# Patient Record
Sex: Female | Born: 1980 | Race: White | Hispanic: No | Marital: Married | Smoking: Former smoker
Health system: Southern US, Community
[De-identification: ages and names within clinical notes are randomized; demographics above are authoritative.]

## PROBLEM LIST (undated history)

## (undated) DIAGNOSIS — F411 Generalized anxiety disorder: Secondary | ICD-10-CM

## (undated) DIAGNOSIS — I10 Essential (primary) hypertension: Secondary | ICD-10-CM

## (undated) DIAGNOSIS — J45909 Unspecified asthma, uncomplicated: Secondary | ICD-10-CM

## (undated) DIAGNOSIS — E890 Postprocedural hypothyroidism: Secondary | ICD-10-CM

## (undated) DIAGNOSIS — M542 Cervicalgia: Secondary | ICD-10-CM

## (undated) DIAGNOSIS — T4145XA Adverse effect of unspecified anesthetic, initial encounter: Secondary | ICD-10-CM

## (undated) DIAGNOSIS — F40240 Claustrophobia: Secondary | ICD-10-CM

## (undated) DIAGNOSIS — R87619 Unspecified abnormal cytological findings in specimens from cervix uteri: Secondary | ICD-10-CM

## (undated) DIAGNOSIS — I639 Cerebral infarction, unspecified: Secondary | ICD-10-CM

## (undated) DIAGNOSIS — G8929 Other chronic pain: Secondary | ICD-10-CM

## (undated) DIAGNOSIS — G43909 Migraine, unspecified, not intractable, without status migrainosus: Secondary | ICD-10-CM

## (undated) DIAGNOSIS — G4700 Insomnia, unspecified: Secondary | ICD-10-CM

## (undated) DIAGNOSIS — G4730 Sleep apnea, unspecified: Secondary | ICD-10-CM

## (undated) DIAGNOSIS — Z87442 Personal history of urinary calculi: Secondary | ICD-10-CM

## (undated) DIAGNOSIS — T8859XA Other complications of anesthesia, initial encounter: Secondary | ICD-10-CM

## (undated) DIAGNOSIS — R002 Palpitations: Secondary | ICD-10-CM

## (undated) DIAGNOSIS — K219 Gastro-esophageal reflux disease without esophagitis: Secondary | ICD-10-CM

## (undated) DIAGNOSIS — M549 Dorsalgia, unspecified: Secondary | ICD-10-CM

## (undated) DIAGNOSIS — M5430 Sciatica, unspecified side: Secondary | ICD-10-CM

## (undated) HISTORY — DX: Generalized anxiety disorder: F41.1

## (undated) HISTORY — PX: CARPAL TUNNEL RELEASE: SHX101

## (undated) HISTORY — PX: TOTAL ABDOMINAL HYSTERECTOMY W/ BILATERAL SALPINGOOPHORECTOMY: SHX83

## (undated) HISTORY — DX: Insomnia, unspecified: G47.00

## (undated) HISTORY — PX: ABDOMINAL HYSTERECTOMY: SHX81

## (undated) HISTORY — DX: Postprocedural hypothyroidism: E89.0

## (undated) HISTORY — PX: CHOLECYSTECTOMY: SHX55

## (undated) HISTORY — DX: Migraine, unspecified, not intractable, without status migrainosus: G43.909

## (undated) HISTORY — PX: LUMBAR EPIDURAL INJECTION: SHX1980

---
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
## 2001-03-20 HISTORY — PX: TUBAL LIGATION: SHX77

## 2002-09-19 ENCOUNTER — Emergency Department (HOSPITAL_COMMUNITY): Admission: EM | Admit: 2002-09-19 | Discharge: 2002-09-19 | Payer: Self-pay | Admitting: Emergency Medicine

## 2002-09-24 ENCOUNTER — Encounter: Payer: Self-pay | Admitting: Emergency Medicine

## 2002-09-24 ENCOUNTER — Ambulatory Visit (HOSPITAL_COMMUNITY): Admission: RE | Admit: 2002-09-24 | Discharge: 2002-09-24 | Payer: Self-pay | Admitting: Emergency Medicine

## 2003-03-01 ENCOUNTER — Emergency Department (HOSPITAL_COMMUNITY): Admission: EM | Admit: 2003-03-01 | Discharge: 2003-03-01 | Payer: Self-pay | Admitting: Emergency Medicine

## 2005-03-05 ENCOUNTER — Emergency Department (HOSPITAL_COMMUNITY): Admission: EM | Admit: 2005-03-05 | Discharge: 2005-03-05 | Payer: Self-pay | Admitting: Emergency Medicine

## 2005-09-08 ENCOUNTER — Emergency Department (HOSPITAL_COMMUNITY): Admission: EM | Admit: 2005-09-08 | Discharge: 2005-09-08 | Payer: Self-pay | Admitting: Emergency Medicine

## 2005-11-01 ENCOUNTER — Emergency Department (HOSPITAL_COMMUNITY): Admission: EM | Admit: 2005-11-01 | Discharge: 2005-11-01 | Payer: Self-pay | Admitting: Emergency Medicine

## 2006-01-10 ENCOUNTER — Emergency Department (HOSPITAL_COMMUNITY): Admission: EM | Admit: 2006-01-10 | Discharge: 2006-01-10 | Payer: Self-pay | Admitting: Emergency Medicine

## 2007-04-10 ENCOUNTER — Ambulatory Visit: Payer: Self-pay | Admitting: Cardiovascular Disease

## 2007-04-15 ENCOUNTER — Ambulatory Visit: Payer: Self-pay | Admitting: Cardiovascular Disease

## 2007-04-15 ENCOUNTER — Ambulatory Visit (HOSPITAL_COMMUNITY): Admission: RE | Admit: 2007-04-15 | Discharge: 2007-04-16 | Payer: Self-pay | Admitting: Cardiovascular Disease

## 2007-04-17 ENCOUNTER — Ambulatory Visit (HOSPITAL_COMMUNITY): Admission: RE | Admit: 2007-04-17 | Discharge: 2007-04-17 | Payer: Self-pay | Admitting: Cardiovascular Disease

## 2007-04-23 ENCOUNTER — Ambulatory Visit: Payer: Self-pay | Admitting: Cardiology

## 2007-05-16 ENCOUNTER — Emergency Department (HOSPITAL_COMMUNITY): Admission: EM | Admit: 2007-05-16 | Discharge: 2007-05-16 | Payer: Self-pay | Admitting: Emergency Medicine

## 2007-08-22 ENCOUNTER — Encounter: Payer: Self-pay | Admitting: Endocrinology

## 2007-08-30 ENCOUNTER — Ambulatory Visit: Payer: Self-pay | Admitting: Endocrinology

## 2007-08-30 DIAGNOSIS — F411 Generalized anxiety disorder: Secondary | ICD-10-CM

## 2007-08-30 DIAGNOSIS — G43909 Migraine, unspecified, not intractable, without status migrainosus: Secondary | ICD-10-CM

## 2007-08-30 DIAGNOSIS — G4700 Insomnia, unspecified: Secondary | ICD-10-CM

## 2007-08-30 HISTORY — DX: Migraine, unspecified, not intractable, without status migrainosus: G43.909

## 2007-08-30 HISTORY — DX: Insomnia, unspecified: G47.00

## 2007-08-30 HISTORY — DX: Generalized anxiety disorder: F41.1

## 2007-09-02 ENCOUNTER — Telehealth (INDEPENDENT_AMBULATORY_CARE_PROVIDER_SITE_OTHER): Payer: Self-pay | Admitting: *Deleted

## 2007-09-03 ENCOUNTER — Encounter: Admission: RE | Admit: 2007-09-03 | Discharge: 2007-09-03 | Payer: Self-pay | Admitting: Endocrinology

## 2007-09-05 ENCOUNTER — Encounter: Admission: RE | Admit: 2007-09-05 | Discharge: 2007-09-05 | Payer: Self-pay | Admitting: Obstetrics and Gynecology

## 2007-10-16 ENCOUNTER — Telehealth (INDEPENDENT_AMBULATORY_CARE_PROVIDER_SITE_OTHER): Payer: Self-pay | Admitting: *Deleted

## 2007-10-20 ENCOUNTER — Emergency Department (HOSPITAL_COMMUNITY): Admission: EM | Admit: 2007-10-20 | Discharge: 2007-10-20 | Payer: Self-pay | Admitting: Emergency Medicine

## 2007-10-28 ENCOUNTER — Ambulatory Visit (HOSPITAL_COMMUNITY): Admission: RE | Admit: 2007-10-28 | Discharge: 2007-10-28 | Payer: Self-pay | Admitting: Family Medicine

## 2007-11-11 ENCOUNTER — Ambulatory Visit: Payer: Self-pay | Admitting: Endocrinology

## 2007-11-15 ENCOUNTER — Telehealth (INDEPENDENT_AMBULATORY_CARE_PROVIDER_SITE_OTHER): Payer: Self-pay | Admitting: *Deleted

## 2008-01-02 ENCOUNTER — Ambulatory Visit: Payer: Self-pay | Admitting: Endocrinology

## 2008-01-15 ENCOUNTER — Telehealth: Payer: Self-pay | Admitting: Endocrinology

## 2008-03-20 DIAGNOSIS — I639 Cerebral infarction, unspecified: Secondary | ICD-10-CM

## 2008-03-20 HISTORY — DX: Cerebral infarction, unspecified: I63.9

## 2008-07-24 ENCOUNTER — Telehealth (INDEPENDENT_AMBULATORY_CARE_PROVIDER_SITE_OTHER): Payer: Self-pay | Admitting: *Deleted

## 2008-09-11 ENCOUNTER — Ambulatory Visit: Payer: Self-pay | Admitting: Endocrinology

## 2008-12-07 ENCOUNTER — Ambulatory Visit: Payer: Self-pay | Admitting: Endocrinology

## 2008-12-07 DIAGNOSIS — E0500 Thyrotoxicosis with diffuse goiter without thyrotoxic crisis or storm: Secondary | ICD-10-CM | POA: Insufficient documentation

## 2008-12-08 LAB — CONVERTED CEMR LAB
Free T4: 0.9 ng/dL (ref 0.6–1.6)
TSH: 0.06 microintl units/mL — ABNORMAL LOW (ref 0.35–5.50)

## 2008-12-22 ENCOUNTER — Encounter (HOSPITAL_COMMUNITY): Admission: RE | Admit: 2008-12-22 | Discharge: 2009-02-19 | Payer: Self-pay | Admitting: Endocrinology

## 2008-12-28 ENCOUNTER — Telehealth: Payer: Self-pay | Admitting: Endocrinology

## 2009-01-06 ENCOUNTER — Ambulatory Visit (HOSPITAL_COMMUNITY): Admission: RE | Admit: 2009-01-06 | Discharge: 2009-01-06 | Payer: Self-pay | Admitting: Endocrinology

## 2009-02-08 ENCOUNTER — Ambulatory Visit: Payer: Self-pay | Admitting: Endocrinology

## 2009-02-08 DIAGNOSIS — E890 Postprocedural hypothyroidism: Secondary | ICD-10-CM

## 2009-02-08 HISTORY — DX: Postprocedural hypothyroidism: E89.0

## 2009-02-08 LAB — CONVERTED CEMR LAB: Free T4: 0.3 ng/dL — ABNORMAL LOW (ref 0.6–1.6)

## 2009-05-10 ENCOUNTER — Telehealth: Payer: Self-pay | Admitting: Endocrinology

## 2009-08-23 ENCOUNTER — Telehealth: Payer: Self-pay | Admitting: Endocrinology

## 2009-08-23 ENCOUNTER — Encounter (INDEPENDENT_AMBULATORY_CARE_PROVIDER_SITE_OTHER): Payer: Self-pay | Admitting: *Deleted

## 2009-08-23 ENCOUNTER — Ambulatory Visit: Payer: Self-pay | Admitting: Endocrinology

## 2009-08-23 LAB — CONVERTED CEMR LAB: TSH: 132.17 microintl units/mL — ABNORMAL HIGH (ref 0.35–5.50)

## 2009-08-24 ENCOUNTER — Telehealth: Payer: Self-pay | Admitting: Endocrinology

## 2009-10-26 ENCOUNTER — Telehealth: Payer: Self-pay | Admitting: Endocrinology

## 2009-10-26 ENCOUNTER — Encounter: Payer: Self-pay | Admitting: Endocrinology

## 2009-11-08 ENCOUNTER — Telehealth: Payer: Self-pay | Admitting: Endocrinology

## 2009-11-23 ENCOUNTER — Telehealth: Payer: Self-pay | Admitting: Endocrinology

## 2010-02-03 ENCOUNTER — Ambulatory Visit: Payer: Self-pay | Admitting: Endocrinology

## 2010-02-03 LAB — CONVERTED CEMR LAB: TSH: 8.35 microintl units/mL — ABNORMAL HIGH (ref 0.35–5.50)

## 2010-04-10 ENCOUNTER — Encounter: Payer: Self-pay | Admitting: Family Medicine

## 2010-04-19 NOTE — Progress Notes (Signed)
Summary: Rx refill req  Phone Note Refill Request Message from:  Patient on November 23, 2009 9:42 AM  Refills Requested: Medication #1:  LEVOTHYROXINE SODIUM 150 MCG TABS 1 once daily   Dosage confirmed as above?Dosage Confirmed   Supply Requested: 3 months  Method Requested: Electronic Initial call taken by: Margaret Pyle, CMA,  November 23, 2009 9:42 AM    Prescriptions: LEVOTHYROXINE SODIUM 150 MCG TABS (LEVOTHYROXINE SODIUM) 1 once daily  #30 x 2   Entered by:   Margaret Pyle, CMA   Authorized by:   Minus Breeding MD   Signed by:   Margaret Pyle, CMA on 11/23/2009   Method used:   Electronically to        [facility] * (retail)       [address]       [place], [place]  [address]       Ph: [phone]       Fax: [phone]   RxID:   [id number]

## 2010-04-19 NOTE — Progress Notes (Signed)
Summary: TSH  Phone Note Call from Patient Call back at Work Phone [phone]   Caller: Patient Call For: Dr Everardo All Summary of Call: Pt had TSH lab 2 wks and is calling for results. Please advise. Initial call taken by: Verdell Face,  November 08, 2009 10:47 AM  Follow-up for Phone Call        Pt advised of Lab results via VM, told to call back with any further questions or concerns Follow-up by: Margaret Pyle, CMA,  November 08, 2009 10:56 AM

## 2010-04-19 NOTE — Letter (Signed)
Summary: Out of Work  [facility]  [address] [place], [place] [address]   Phone: [phone]  Fax: [phone]    August 23, 2009   Employee:  EVALIE HARGRAVES Hofland    To Whom It May Concern:   For Medical reasons, please excuse the above named employee from work for the following dates:  Start: 08/23/09    End: 08/23/09    If you need additional information, please feel free to contact our office.         Sincerely,    Dr. Romero Belling

## 2010-04-19 NOTE — Progress Notes (Signed)
Summary: TSH Lab order  Phone Note Call from Patient Call back at Work Phone [phone]   Caller: Patient Summary of Call: Pt called requesting lab order for TSH check. Pt states that [facility] will accept orders. Fax [phone]. Lab order must include Dx code and return fax number. Initial call taken by: Margaret Pyle, CMA,  October 26, 2009 10:05 AM  Follow-up for Phone Call        i printed Follow-up by: Minus Breeding MD,  October 26, 2009 10:24 AM  Additional Follow-up for Phone Call Additional follow up Details #1::        Rx faxed, pt informed Additional Follow-up by: Margaret Pyle, CMA,  October 26, 2009 10:57 AM    New/Updated Medications: * TSH 244.1  Prescriptions: TSH 244.1   #0 x 0   Entered and Authorized by:   Minus Breeding MD   Signed by:   Minus Breeding MD on 10/26/2009   Method used:   Print then Give to Patient   RxID:   [id number]

## 2010-04-19 NOTE — Progress Notes (Signed)
Summary: Rx wrong pharmacy  Phone Note Call from Patient Call back at Work Phone [phone]   Caller: Patient Summary of Call: Pt called requesting Rx for Synthroid be sent to [facility] in [place] Initial call taken by: Margaret Pyle, CMA,  August 24, 2009 3:22 PM    Prescriptions: LEVOTHYROXINE SODIUM 150 MCG TABS (LEVOTHYROXINE SODIUM) 1 once daily  #30 x 2   Entered by:   Margaret Pyle, CMA   Authorized by:   Minus Breeding MD   Signed by:   Margaret Pyle, CMA on 08/24/2009   Method used:   Electronically to        [facility]. [address] * (retail)       [address]       [place], [place]  [address]       Ph: [phone]       Fax: [phone]   RxID:   [id number]

## 2010-04-19 NOTE — Assessment & Plan Note (Signed)
Summary: right arm pain,itching,burning,eyes protruding and dry/lb   Vital Signs:  Patient profile:   30 year old female Height:      68 inches (172.72 cm) Weight:      192 pounds (87.27 kg) BMI:     29.30 O2 Sat:      97 % on Room air Temp:     98.3 degrees F (36.83 degrees C) oral Pulse rate:   76 / minute BP sitting:   130 / 84  (left arm) Cuff size:   regular  Vitals Entered By: Brenton Grills CMA Duncan Dull) (February 03, 2010 2:45 PM)  O2 Flow:  Room air CC: Muscle aches, fatigues, itching, burning eyes/aj Is Patient Diabetic? No   Referring Provider:  Lubertha South  CC:  Muscle aches, fatigues, itching, and burning eyes/aj.  History of Present Illness: pt states few weeks of slight swelling at the right hand, and associated pain at the right arm and forearm.   her proptosis is worse recently, especially in am.    Current Medications (verified): 1)  Tylox 5-500 Mg  Caps (Oxycodone-Acetaminophen) .... Take 1-2 By Mouth Once Daily Prn 2)  Temazepam 30 Mg  Caps (Temazepam) .... Take 1 At Bedtime Prn 3)  Levothyroxine Sodium 150 Mcg Tabs (Levothyroxine Sodium) .Marland Kitchen.. 1 Once Daily 4)  Tsh 244.1  Allergies (verified): No Known Drug Allergies  Past History:  Past Medical History: Last updated: 11/11/2007 HYPERTHYROIDISM (ICD-242.90) INSOMNIA (ICD-780.52) MIGRAINE HEADACHE (ICD-346.90) ANXIETY (ICD-300.00)  Review of Systems  The patient denies fever.         denies skin rash.  Physical Exam  General:  normal appearance.  normal appearance.   Eyes:  there is moderate bilateral proptosis Pulses:  right radial is normal Extremities:  right hand:  slight swelling, but no erythema/warmth/tenderness Additional Exam:  FastTSH              [H]  8.35 uIU/mL     Impression & Recommendations:  Problem # 1:  GRAVES' DISEASE (ICD-242.00) opthalmopathy is slightly worse  Problem # 2:  hand swelling mild uncertain etiology  Medications Added to Medication List This  Visit: 1)  Prednisone 10 Mg Tabs (Prednisone) .... As dir 2)  Levothyroxine Sodium 175 Mcg Tabs (Levothyroxine sodium) .Marland Kitchen.. 1 tab once daily  Other Orders: TLB-TSH (Thyroid Stimulating Hormone) (84443-TSH) Est. Patient Level II (75643)  Patient Instructions: 1)  on a trial basis, take prednisone 10 mg.  4/day x 2 days, then 2/day x 2 days, then 1/day x 2 days. 2)  blood tests are being ordered for you today.  please call [phone] to hear your test results. 3)  (update: i left message on phone-tree:  increase synthroid to 175 micrograms/day). Prescriptions: LEVOTHYROXINE SODIUM 175 MCG TABS (LEVOTHYROXINE SODIUM) 1 tab once daily  #30 x 11   Entered and Authorized by:   Minus Breeding MD   Signed by:   Minus Breeding MD on 02/04/2010   Method used:   Electronically to        [facility]. [address] * (retail)       [address]       [place], [place]  [address]       Ph: [phone]       Fax: [phone]   RxID:   [id number] PREDNISONE 10 MG TABS (PREDNISONE) as dir  #15 x 0   Entered and Authorized by:   Minus Breeding MD   Signed by:   Gregary Signs  Ardeen Garland MD on 02/03/2010   Method used:   Electronically to        [facility]. [address] * (retail)       [address]       [place], [place]  [address]       Ph: [phone]       Fax: [phone]   RxID:   [id number]    Orders Added: 1)  TLB-TSH (Thyroid Stimulating Hormone) [84443-TSH] 2)  Est. Patient Level II [84132]

## 2010-04-19 NOTE — Progress Notes (Signed)
Summary: Dosage verification  Phone Note Call from Patient Call back at Home Phone [phone] Call back at Work Phone [phone]   Caller: Patient Summary of Call: Pt called to inform Md of her current dose of Synthroid: Initial call taken by: Margaret Pyle, CMA,  August 23, 2009 4:24 PM  Follow-up for Phone Call        see phone-tree message Follow-up by: Minus Breeding MD,  August 23, 2009 7:57 PM

## 2010-04-19 NOTE — Progress Notes (Signed)
Summary: rx refill  Phone Note Call from Patient Call back at Work Phone [phone]   Caller: Patient Summary of Call: pt called stating that she has scheduled an appt for the end of March. pt is requesting a month supply of Levothyroxine Initial call taken by: Margaret Pyle, CMA,  May 10, 2009 10:42 AM    Prescriptions: LEVOTHYROXINE SODIUM 100 MCG TABS (LEVOTHYROXINE SODIUM) 1 qd  #30 x 0   Entered by:   Margaret Pyle, CMA   Authorized by:   Minus Breeding MD   Signed by:   Margaret Pyle, CMA on 05/10/2009   Method used:   Electronically to        [facility]. [address] * (retail)       [address]       [place], [place]  [address]       Ph: [phone]       Fax: [phone]   RxID:   [id number]

## 2010-04-19 NOTE — Assessment & Plan Note (Signed)
Summary: FU---STC   Vital Signs:  Patient profile:   30 year old female Height:      68 inches (172.72 cm) Weight:      189.8 pounds (86.27 kg) O2 Sat:      97 % on Room air Temp:     98.1 degrees F (36.72 degrees C) oral Pulse rate:   68 / minute BP sitting:   112 / 78  (left arm) Cuff size:   regular  Vitals Entered By: Orlan Leavens (August 23, 2009 10:39 AM)  O2 Flow:  Room air CC: f/u on thyroid Is Patient Diabetic? No   Referring Provider:  Lubertha South  CC:  f/u on thyroid.  History of Present Illness: pt states she still has irritation and swelling of the eyes.  she had i-131 rx for hyperthyroidism, approx 7 mos ago.  she says her synthroid pill is peach-colored. she has muscle cramps and myalgias.    Current Medications (verified): 1)  Tylox 5-500 Mg  Caps (Oxycodone-Acetaminophen) .... Take 1-2 By Mouth Once Daily Prn 2)  Temazepam 30 Mg  Caps (Temazepam) .... Take 1 At Bedtime Prn 3)  Levothyroxine Sodium 100 Mcg Tabs (Levothyroxine Sodium) .Marland Kitchen.. 1 Qd  Allergies (verified): No Known Drug Allergies  Past History:  Past Medical History: Last updated: 11/11/2007 HYPERTHYROIDISM (ICD-242.90) INSOMNIA (ICD-780.52) MIGRAINE HEADACHE (ICD-346.90) ANXIETY (ICD-300.00)  Review of Systems       The patient complains of weight gain.         she reports fatigue.  Physical Exam  General:  obese.  no distress  Eyes:  there is moderate bilateral proptosis Neck:  thyroid slightly enlarged, if at all.  no nodule. Msk:  no muscle tenderness Skin:  not diaphoretic   Impression & Recommendations:  Problem # 1:  HYPOTHYROIDISM, POST-RADIATION (ICD-244.1) needs increased rx ? compliance  Medications Added to Medication List This Visit: 1)  Levothyroxine Sodium 150 Mcg Tabs (Levothyroxine sodium) .Marland Kitchen.. 1 once daily  Other Orders: TLB-TSH (Thyroid Stimulating Hormone) (84443-TSH) Est. Patient Level II ([address])  Patient Instructions: 1)  tests are being ordered  for you today.  a few days after the test(s), please call [phone] to hear your test results. 2)  i would be happy to check other blood tests for your symptoms, but it is cheaper to check the thyroid first. 3)  Please schedule a follow-up appointment in 6 months. 4)  please call us and verify the dosage of your thyroid pill. 5)  (update:  i left message on phone tree:  increase synthroid to 150/day.  go to lab in 1 month for tsh 244.1) Prescriptions: LEVOTHYROXINE SODIUM 150 MCG TABS (LEVOTHYROXINE SODIUM) 1 once daily  #30 x 2   Entered and Authorized by:   Minus Breeding MD   Signed by:   Minus Breeding MD on 08/23/2009   Method used:   Electronically to        [facility] [address] [facility] (retail)       [address]       [place], [place]  [address]       Ph: [phone]       Fax: [phone]   RxID:   [id number]

## 2010-08-02 NOTE — Assessment & Plan Note (Signed)
[facility]                       [place] CARDIOLOGY OFFICE NOTE   NAME:Mauney, SHAMAR KRACKE                  MRN:          [id number]  DATE:04/10/2007                            DOB:          05-29-1980    Ms. Peeters is a 30 year old patient of Dr. Lubertha South.  She is  referred for primarily palpitations and shortness of breath.   Cataleyah is an interesting person.  She has been having palpitations  since August, she gets some daily.  The longest they last is 8-10 beats.  She has never passed out from them, they are irritating however.  She  sometimes can cough or hold her breath and they seem to get better.   She does not relate them to anything in particular.  She is taking a new  diet pill, she says it has a diuretic in it.  I told her we needed to  see this, but it may be contributing.   In regards her palpitations they are not associated with chest pain.  She does have some chronic exertional dyspnea, particularly when she has  her palpitations.  There is no history of PE.  She is a pack and a half  a day smoker.  She smokes pot but denies any other stimulants.   Her palpitations have been somewhat progressive since August.   REVIEW OF SYSTEMS:  Otherwise remarkable for a question of a TIA  December 25.  She was in her car, she had some numbness and tingling in  her right hand and a question of a facial droop.  She says she was seen  at Dr. Fletcher Anon office.  As far as I can tell she was not placed on  Plavix or Aggrenox and did not have a big workup.  I do not know what  this involved but she says she did not have a CAT scan or MRI.   Her symptoms seemed to have resolved.   The patient's review of systems otherwise negative.   PAST MEDICAL HISTORY:  1. Insomnia on temazepam 15 nightly.  2. Depression on Celexa 25 a day.  3. She smokes a pack and a half a day.  4. History of migraines.   PAST SURGICAL HISTORY:  A partial tubal  ligation.   She is married.  Her husband is a Psychologist, occupational.  She is fairly busy.  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  She admits to smoking a  pack and a half a day as well as smoking pot, but denies other drugs.  She had her stepdaughter with her today.  She is fairly sedentary  however.   MEDICATIONS:  1. Celexa 25 a day.  2. Temazepam 15 nightly.  3. An aspirin a day.   SHE HAS NO KNOWN ALLERGIES.   She does not drink alcohol.   Father died in his 21s of an MI.  Mother is alive with high blood  pressure.  She has 4 brothers and a sister who are alive.   EXAM:  Remarkable for a young  white female in no distress.  She has  multiple tattoos on both arms and both legs.  Her blood pressure is  120/70, pulse 84 and regular, weight 198, afebrile, respiratory rate 14.  HEENT:  Unremarkable.  She does appear to have bilateral carotid bruits,  I was surprised by this, they tend to be high up near the jaw and may  represent fibromuscular dysplasia.  There is no JVP elevation,  lymphadenopathy or thyromegaly.  LUNGS:  Clear with good diaphragmatic motion, no wheezing.  S1 and S2 with normal heart sounds, PMI normal.  ABDOMEN:  Benign, no renal bruits, no AAA, no hepatosplenomegaly or  hepatojugular reflux.  Distal pulses are intact, no edema.  NEURO:  Nonfocal.  SKIN:  Warm and dry.  No muscular weakness.   EKG is normal with occasional PACs and PVCs.   IMPRESSION:  1. Palpitations.  They sound benign, they may be related to this diet      medicine that she takes since they are happening daily.  We will      get a 48-hour Holter.  I suspect she may benefit from beta-blocker.  2. Question of a transient ischemic attack in December.  This puzzles      me, I am not sure why this was not worked up further.  She appears      to have bilateral bruits.  We will start by getting a carotid      duplex.  I may go  further depending on her insurance and look for      more intracranial abnormalities with an MRA.  For the time being we will continue her on aspirin and have a low  threshold to add Aggrenox.  1. In the setting of some dyspnea, palpitations and a question of a      transient ischemic attack, the patient will have a 2D      echocardiogram to rule out congenital heart disease, source of      embolus and assess left ventricular function.  2. Smoking cessation.  I talked to her at length about this, counseled      her for less than 10 minutes.  She will try to cut back and is not      interested in Chantix.  I would not recommend this since she is      already taking Celexa and temazepam.  3. Marijuana use.  Again cautioned the patient about this in regards      to amotivational syndrome and leading to other hard core drugs.  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  I will see her back after  her Holter monitor, echo and carotid duplex.     Noralyn Pick. Eden Emms, MD, [facility]  Electronically Signed    PCN/MedQ  DD: 04/10/2007  DT: 04/10/2007  Job #: [id number]

## 2010-08-02 NOTE — Procedures (Signed)
NAME:  Teresa Vincent, Teresa Vincent           ACCOUNT NO.:  [id number]   MEDICAL RECORD NO.:  [id number]          PATIENT TYPE:  OUT   LOCATION:  RAD                           FACILITY:  [facility]   PHYSICIAN:  Peter C. Eden Emms, MD, FACCDATE OF BIRTH:  12/03/1980   DATE OF PROCEDURE:  DATE OF DISCHARGE:                                ECHOCARDIOGRAM   INDICATIONS:  Check LV function, palpitations, TIA, CVA.   Left ventricular cavity size was normal.  There was no LVH.  EF was 60%.  There was no mural thrombus or regional wall motion abnormalities.  Mitral, aortic, and tricuspid valves were normal.   Left atrium and right-sided cardiac chambers were normal.  There was no  evidence of pulmonary hypertension.  There was no ASD or VSD.  Subcostal  imaging revealed no atrial or septal defect.  No source of embolus.  No  effusion.   FINAL IMPRESSION:  1. Normal 2D echocardiogram with ejection fraction of 60%.  2. No source of embolus.      Noralyn Pick. Eden Emms, MD, [facility]  Electronically Signed     PCN/MEDQ  D:  04/15/2007  T:  04/15/2007  Job:  [id number]   cc:   Lorin Picket A. Gerda Diss, MD  Fax: [phone]

## 2011-01-26 ENCOUNTER — Other Ambulatory Visit: Payer: Self-pay | Admitting: *Deleted

## 2011-01-26 NOTE — Telephone Encounter (Signed)
Pt informed of MD's advisement and states that she is unable to financially come to [facility] for appointment so that is why she asked PCP to send lab work to [place].

## 2011-01-26 NOTE — Telephone Encounter (Signed)
Pt called and left message stating that since she is unable to travel to [facility] for appointment she will have her MD in [place] send labs for thyroid and will need rx for medication once labs are reviewed.

## 2011-01-26 NOTE — Telephone Encounter (Signed)
Sorry, i can't prescribe if you can't come here.  Please ask va dr to prescribe.

## 2011-01-26 NOTE — Telephone Encounter (Signed)
i understand--plase ask va dr to prescribe thyroid medication.

## 2011-02-03 NOTE — Telephone Encounter (Signed)
Pt informed of MD's advisement. 

## 2011-02-23 ENCOUNTER — Other Ambulatory Visit: Payer: Self-pay | Admitting: *Deleted

## 2011-02-23 MED ORDER — LEVOTHYROXINE SODIUM 175 MCG PO TABS: 175.0000 ug | ORAL_TABLET | Freq: Every day | ORAL | Status: DC

## 2011-02-23 NOTE — Telephone Encounter (Signed)
Pt is requesting refill of thyroid medication. Rx sent, pt informed and also to make an OV with MD as soon as possible.   Last OV-11/2009

## 2011-05-12 ENCOUNTER — Ambulatory Visit: Payer: Self-pay | Admitting: Endocrinology

## 2011-06-02 ENCOUNTER — Ambulatory Visit: Payer: Self-pay | Admitting: Endocrinology

## 2011-08-28 ENCOUNTER — Other Ambulatory Visit: Payer: Self-pay

## 2011-08-28 MED ORDER — LEVOTHYROXINE SODIUM 175 MCG PO TABS: 175.0000 ug | ORAL_TABLET | Freq: Every day | ORAL | Status: DC

## 2011-08-28 NOTE — Telephone Encounter (Signed)
Pt request refill of medication to last until appt 06/14

## 2011-08-30 ENCOUNTER — Ambulatory Visit: Payer: Self-pay | Admitting: Endocrinology

## 2011-08-31 ENCOUNTER — Encounter: Payer: Self-pay | Admitting: Endocrinology

## 2011-08-31 ENCOUNTER — Other Ambulatory Visit (INDEPENDENT_AMBULATORY_CARE_PROVIDER_SITE_OTHER): Payer: Self-pay

## 2011-08-31 ENCOUNTER — Ambulatory Visit (INDEPENDENT_AMBULATORY_CARE_PROVIDER_SITE_OTHER): Payer: Self-pay | Admitting: Endocrinology

## 2011-08-31 VITALS — BP 116/72 | HR 77 | Temp 99.1°F | Ht 67.0 in | Wt 209.0 lb

## 2011-08-31 DIAGNOSIS — E890 Postprocedural hypothyroidism: Secondary | ICD-10-CM

## 2011-08-31 MED ORDER — LEVOTHYROXINE SODIUM 150 MCG PO TABS: 150.0000 ug | ORAL_TABLET | Freq: Every day | ORAL | Status: DC

## 2011-08-31 NOTE — Patient Instructions (Addendum)
blood tests are being requested for you today.  You will receive a letter with results. Please return in 1 year. 

## 2011-08-31 NOTE — Progress Notes (Signed)
  Subjective:    Patient ID: Teresa Vincent, female    DOB: [birth year], 31 y.o.   MRN: [id number]  HPI Pt had i-131 rx for hyperthyroidism due to grave's dz in 2010.  Pt says her proptosis is slightly better recently.   Past Medical History  Diagnosis Date  . ANXIETY 08/30/2007  . GRAVES' DISEASE 12/07/2008  . 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 INSOMNIA 08/30/2007  . MIGRAINE HEADACHE 08/30/2007    Past Surgical History  Procedure Date  . Tubal ligation 2003    History   Social History  . Marital Status: Married    Spouse Name: N/A    Number of Children: N/A  . Years of Education: N/A   Occupational History  . Doesn't work outside the home    Social History Main Topics  . Smoking status: Current Everyday Smoker  . Smokeless tobacco: Not on file  . Alcohol Use: Not on file  . Drug Use: Not on file  . Sexually Active: Not on file   Other Topics Concern  . Not on file   Social History Narrative  . No narrative on file    Current Outpatient Prescriptions on File Prior to Visit  Medication Sig Dispense Refill  . losartan (COZAAR) 50 MG tablet Take 50 mg by mouth daily.      Marland Kitchen oxyCODONE-acetaminophen (TYLOX) 5-500 MG per capsule Take 1-2 by mouth once daily as needed      . temazepam (RESTORIL) 30 MG capsule Take 30 mg by mouth at bedtime as needed.        Allergies  Allergen Reactions  . Morphine And Related     Family History  Problem Relation Age of Onset  . Thyroid disease Neg Hx     BP 116/72  Pulse 77  Temp 99.1 F (37.3 C) (Oral)  Ht 5\' 7"  (1.702 m)  Wt 209 lb (94.802 kg)  BMI 32.73 kg/m2  SpO2 95%  LMP 08/26/2011   Review of Systems She has chronic weight gain.     Objective:   Physical Exam VITAL SIGNS:  See vs page GENERAL: no distress NECK: There is no palpable thyroid enlargement.  No thyroid nodule is palpable.  No palpable lymphadenopathy at the anterior neck.   Lab Results  Component Value Date   TSH 0.13* 08/31/2011        Assessment & Plan:  Hypothyroidism.  overreplaced

## 2011-09-01 ENCOUNTER — Telehealth: Payer: Self-pay | Admitting: *Deleted

## 2011-09-01 NOTE — Telephone Encounter (Signed)
Called pt to inform of lab results, pt informed (letter also mailed to pt). 

## 2012-06-26 ENCOUNTER — Telehealth: Payer: Self-pay | Admitting: Endocrinology

## 2012-06-26 NOTE — Telephone Encounter (Signed)
June of this year

## 2012-06-26 NOTE — Telephone Encounter (Signed)
Patient called to see when she needs to come see Dr. Everardo All again. The patient is not on recall and I did not see anything in the last note. Please advise. Call back # [phone] / Roanna Raider

## 2012-06-26 NOTE — Telephone Encounter (Signed)
LM for pt to call and schedule an appt in June with Dr. Everardo All / Oneita Kras.

## 2012-07-10 NOTE — Telephone Encounter (Signed)
Pt called back and scheduled appt for 09/02/12 / Sherri S.

## 2012-07-30 ENCOUNTER — Telehealth: Payer: Self-pay | Admitting: Endocrinology

## 2012-07-30 ENCOUNTER — Telehealth: Payer: Self-pay | Admitting: Family Medicine

## 2012-07-30 NOTE — Telephone Encounter (Signed)
error 

## 2012-07-30 NOTE — Telephone Encounter (Signed)
I need chart

## 2012-07-30 NOTE — Telephone Encounter (Signed)
Patient needs a refill of her Tylox

## 2012-07-30 NOTE — Telephone Encounter (Signed)
See multi messages . I need chart.

## 2012-07-30 NOTE — Telephone Encounter (Signed)
Chart was sent back after message was taken.

## 2012-07-31 ENCOUNTER — Telehealth: Payer: Self-pay | Admitting: Family Medicine

## 2012-07-31 NOTE — Telephone Encounter (Signed)
Patient needs a refill of her Tylox °

## 2012-07-31 NOTE — Telephone Encounter (Signed)
See 07/30/12 message

## 2012-07-31 NOTE — Telephone Encounter (Signed)
Found chart up front and i placed it in your chair.

## 2012-07-31 NOTE — Telephone Encounter (Signed)
Not seen half a year. Needs ov first

## 2012-07-31 NOTE — Telephone Encounter (Signed)
Discussed with patient. Patient scheduled office visit 

## 2012-08-09 ENCOUNTER — Encounter: Payer: Self-pay | Admitting: *Deleted

## 2012-08-13 ENCOUNTER — Ambulatory Visit: Payer: Self-pay | Admitting: Family Medicine

## 2012-08-16 ENCOUNTER — Telehealth: Payer: Self-pay | Admitting: Endocrinology

## 2012-08-16 MED ORDER — LEVOTHYROXINE SODIUM 150 MCG PO TABS: 150.0000 ug | ORAL_TABLET | Freq: Every day | ORAL | Status: DC

## 2012-08-16 NOTE — Telephone Encounter (Signed)
Needs refill on Synthroid. Pt is out of work and has r/s follow up appt to the end of July. Will need refills to get her to next appt. Uses [facility]

## 2012-09-02 ENCOUNTER — Ambulatory Visit: Payer: Self-pay | Admitting: Endocrinology

## 2012-10-11 ENCOUNTER — Ambulatory Visit: Payer: Self-pay | Admitting: Endocrinology

## 2012-12-24 ENCOUNTER — Telehealth: Payer: Self-pay | Admitting: Endocrinology

## 2012-12-24 NOTE — Telephone Encounter (Signed)
Pt advised.

## 2012-12-24 NOTE — Telephone Encounter (Signed)
Sorry, we can't treat patients we cannot see.

## 2012-12-30 ENCOUNTER — Ambulatory Visit (INDEPENDENT_AMBULATORY_CARE_PROVIDER_SITE_OTHER): Payer: Self-pay | Admitting: Family Medicine

## 2012-12-30 ENCOUNTER — Encounter: Payer: Self-pay | Admitting: Family Medicine

## 2012-12-30 VITALS — BP 140/94 | Ht 67.0 in | Wt 215.8 lb

## 2012-12-30 DIAGNOSIS — G43909 Migraine, unspecified, not intractable, without status migrainosus: Secondary | ICD-10-CM

## 2012-12-30 DIAGNOSIS — G4700 Insomnia, unspecified: Secondary | ICD-10-CM

## 2012-12-30 DIAGNOSIS — K219 Gastro-esophageal reflux disease without esophagitis: Secondary | ICD-10-CM

## 2012-12-30 DIAGNOSIS — I10 Essential (primary) hypertension: Secondary | ICD-10-CM | POA: Insufficient documentation

## 2012-12-30 MED ORDER — PHENTERMINE HCL 37.5 MG PO CAPS: 37.5000 mg | ORAL_CAPSULE | ORAL | Status: DC

## 2012-12-30 MED ORDER — ALPRAZOLAM 0.5 MG PO TABS: 0.5000 mg | ORAL_TABLET | Freq: Every day | ORAL | Status: DC | PRN

## 2012-12-30 MED ORDER — OXYCODONE-ACETAMINOPHEN 5-325 MG PO TABS: 1.0000 | ORAL_TABLET | Freq: Every day | ORAL | Status: DC | PRN

## 2012-12-30 NOTE — Progress Notes (Signed)
  Subjective:    Patient ID: Teresa Vincent, female    DOB: 18-Aug-1980, 32 y.o.   MRN: [id number]  HPI Patient is here today to get a refill on her Tylox medication.   Pt would also like to discuss weight loss. Would like to try med to help lose weight.  Stopped bp meds, stated she did not like the way it made her feel.  Stopped omeprazole reports less reflux now that she has lost weight.  Exercising some, but not a lot, Has tried to change eating habits, got weight down to 208.  Migraines occuring only rarely, less so now that she is exercising. Quit smoking also. When the migraines come she does like to have a stronger pain medicine on hand.   Review of Systems No chest pain no back pain no loss of consciousness ROS otherwise negative.    Objective:   Physical Exam  Alert no apparent distress. Lungs clear. Heart regular in rhythm. H&T normal. Abdomen soft. Blood pressure repeat 140/90. Ankles without edema. Neuro intact.      Assessment & Plan:  Pression migraine headaches discussed patient states definitely need occasional stronger pain medicine. #2 hypertension decent control off medicines. #3 chronic anxiety uses Xanax sparingly. #4 body mass index over 30. Patient very motivated to lose weight. Options discussed. Plan phentermine 37.5 #30 with 2 refills. Diet exercise discussed. Oxycodone acetaminophen refilled. Xanax refilled. Patient congratulated for quitting smoking and exercising regularly. WSL

## 2012-12-31 ENCOUNTER — Ambulatory Visit: Payer: Self-pay | Admitting: Endocrinology

## 2013-01-13 ENCOUNTER — Other Ambulatory Visit: Payer: Self-pay | Admitting: Endocrinology

## 2013-03-07 ENCOUNTER — Other Ambulatory Visit: Payer: Self-pay | Admitting: Endocrinology

## 2013-03-27 ENCOUNTER — Encounter: Payer: Self-pay | Admitting: Endocrinology

## 2013-03-27 ENCOUNTER — Ambulatory Visit (INDEPENDENT_AMBULATORY_CARE_PROVIDER_SITE_OTHER): Payer: Self-pay | Admitting: Endocrinology

## 2013-03-27 VITALS — BP 126/80 | HR 92 | Temp 97.6°F | Ht 67.0 in | Wt 194.0 lb

## 2013-03-27 DIAGNOSIS — E890 Postprocedural hypothyroidism: Secondary | ICD-10-CM

## 2013-03-27 NOTE — Patient Instructions (Signed)
Here are some samples of your thyroid pill. Here is a letter to get the blood test redone in a few months.

## 2013-03-27 NOTE — Progress Notes (Signed)
   Subjective:    Patient ID: Teresa Vincent, female    DOB: 03/25/1980, 33 y.o.   MRN: [id number]  HPI Pt had i-131 rx for hyperthyroidism due to grave's dz in 2010.  pt states she feels well in general, except tired since she has been out of synthroid.   Past Medical History  Diagnosis Date  . ANXIETY 08/30/2007  . GRAVES' DISEASE 12/07/2008  . 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 INSOMNIA 08/30/2007  . MIGRAINE HEADACHE 08/30/2007  . Depression     Past Surgical History  Procedure Laterality Date  . Tubal ligation  2003    History   Social History  . Marital Status: Married    Spouse Name: N/A    Number of Children: N/A  . Years of Education: N/A   Occupational History  . Doesn't work outside the home    Social History Main Topics  . Smoking status: Former Smoker    Quit date: 06/29/2012  . Smokeless tobacco: Not on file  . Alcohol Use: Not on file  . Drug Use: Not on file  . Sexual Activity: Not on file   Other Topics Concern  . Not on file   Social History Narrative  . No narrative on file    Current Outpatient Prescriptions on File Prior to Visit  Medication Sig Dispense Refill  . ALPRAZolam (XANAX) 0.5 MG tablet Take 1 tablet (0.5 mg total) by mouth daily as needed for anxiety.  30 tablet  1  . levothyroxine (SYNTHROID, LEVOTHROID) 150 MCG tablet TAKE ONE TABLET BY MOUTH ONCE DAILY  30 tablet  0  . losartan (COZAAR) 50 MG tablet Take 50 mg by mouth daily.      Marland Kitchen. omeprazole (PRILOSEC) 20 MG capsule Take 20 mg by mouth daily.      Marland Kitchen. oxyCODONE-acetaminophen (PERCOCET/ROXICET) 5-325 MG per tablet Take 1 tablet by mouth daily as needed for pain.  30 tablet  0  . oxyCODONE-acetaminophen (TYLOX) 5-500 MG per capsule Take 1-2 by mouth once daily as needed      . phentermine 37.5 MG capsule Take 1 capsule (37.5 mg total) by mouth every morning.  30 capsule  2  . temazepam (RESTORIL) 30 MG capsule Take 30 mg by mouth at bedtime as needed.       No current  facility-administered medications on file prior to visit.   Allergies  Allergen Reactions  . Celexa [Citalopram Hydrobromide]     Side effects  . Morphine And Related   . Zoloft [Sertraline Hcl] Diarrhea and Nausea And Vomiting   Family History  Problem Relation Age of Onset  . Thyroid disease Neg Hx   . Hypertension Mother    BP 126/80  Pulse 92  Temp(Src) 97.6 F (36.4 C) (Oral)  Ht 5\' 7"  (1.702 m)  Wt 194 lb (87.998 kg)  BMI 30.38 kg/m2  SpO2 97%  Review of Systems Denies weight change    Objective:   Physical Exam VITAL SIGNS:  See vs page GENERAL: no distress NECK: There is no palpable thyroid enlargement.  No thyroid nodule is palpable.  No palpable lymphadenopathy at the anterior neck.         Assessment & Plan:  Post-i-131 hypothyroidism.  She needs to resume rx.  Pt says she'll get f/u here or elsewhere.

## 2013-05-14 ENCOUNTER — Telehealth: Payer: Self-pay | Admitting: Endocrinology

## 2013-05-14 NOTE — Telephone Encounter (Signed)
please call patient: Your thyroid level is a tiny bit low.  Do you ever miss the thyroid pill?

## 2013-05-22 ENCOUNTER — Telehealth: Payer: Self-pay | Admitting: Endocrinology

## 2013-05-22 ENCOUNTER — Telehealth: Payer: Self-pay | Admitting: *Deleted

## 2013-05-22 MED ORDER — LEVOTHYROXINE SODIUM 150 MCG PO TABS: 150.0000 ug | ORAL_TABLET | Freq: Every day | ORAL | Status: DC

## 2013-05-22 NOTE — Telephone Encounter (Signed)
Pt informed

## 2013-05-22 NOTE — Telephone Encounter (Signed)
Requested call back.  

## 2013-05-22 NOTE — Telephone Encounter (Signed)
please call patient: Blood test is minimally off. Please continue the same thyroid medication.

## 2013-06-17 ENCOUNTER — Encounter: Payer: Self-pay | Admitting: Endocrinology

## 2014-02-06 ENCOUNTER — Encounter: Payer: Self-pay | Admitting: Family Medicine

## 2014-02-06 ENCOUNTER — Telehealth: Payer: Self-pay | Admitting: Nurse Practitioner

## 2014-02-06 ENCOUNTER — Ambulatory Visit (INDEPENDENT_AMBULATORY_CARE_PROVIDER_SITE_OTHER): Payer: Self-pay | Admitting: Nurse Practitioner

## 2014-02-06 ENCOUNTER — Encounter: Payer: Self-pay | Admitting: Nurse Practitioner

## 2014-02-06 VITALS — BP 122/80 | Temp 98.6°F | Ht 67.0 in | Wt 195.0 lb

## 2014-02-06 DIAGNOSIS — L720 Epidermal cyst: Secondary | ICD-10-CM

## 2014-02-06 DIAGNOSIS — IMO0002 Reserved for concepts with insufficient information to code with codable children: Secondary | ICD-10-CM

## 2014-02-06 MED ORDER — ALPRAZOLAM 0.5 MG PO TABS: 0.5000 mg | ORAL_TABLET | Freq: Every day | ORAL | Status: DC | PRN

## 2014-02-06 MED ORDER — SULFAMETHOXAZOLE-TRIMETHOPRIM 800-160 MG PO TABS
1.0000 | ORAL_TABLET | Freq: Two times a day (BID) | ORAL | Status: DC
Start: 2014-02-06 — End: 2014-02-22

## 2014-02-06 MED ORDER — OXYCODONE-ACETAMINOPHEN 5-325 MG PO TABS: 1.0000 | ORAL_TABLET | Freq: Every day | ORAL | Status: DC | PRN

## 2014-02-06 NOTE — Telephone Encounter (Signed)
Patient said that she thought she was to have an antibiotic called in, but nothing was called in. Please advise.   [facility] [place]

## 2014-02-06 NOTE — Telephone Encounter (Signed)
Patient notified

## 2014-02-06 NOTE — Telephone Encounter (Signed)
done

## 2014-02-11 ENCOUNTER — Encounter: Payer: Self-pay | Admitting: Nurse Practitioner

## 2014-02-11 NOTE — Progress Notes (Signed)
Subjective:  Presents for "knots" on either side of the groin. One on left side has been there for awhile. Became tender recently; slight itching. Smaller area on right side. No fever or drainage. Would like them evaluated and possibly removed if needed. Requesting refill on oxycodone; takes a rare pill only for severe migraine; last refill was 2014. Also takes 1/2 Xanax on occasion for anxiety. Uninsured today. Hopes to have insurance in the future.  Objective:   BP 122/80 mmHg  Temp(Src) 98.6 F (37 C) (Oral)  Ht 5\' 7"  (1.702 m)  Wt 195 lb (88.451 kg)  BMI 30.53 kg/m2 NAD. Alert, oriented. Firm mobile superficial cyst on the lower part of the mons pubis at top of left labia. No discoloration. No lesions. Slightly tender to palpation. A much smaller lesion noted on right side.  Assessment: Groin cyst  Plan:  Meds ordered this encounter  Medications  . amLODipine (NORVASC) 10 MG tablet    Sig: Take 10 mg by mouth daily.  Marland Kitchen. ALPRAZolam (XANAX) 0.5 MG tablet    Sig: Take 1 tablet (0.5 mg total) by mouth daily as needed for anxiety.    Dispense:  30 tablet    Refill:  1    Order Specific Question:  Supervising Provider    Answer:  Merlyn AlbertLUKING, WILLIAM S [[id number]]  . oxyCODONE-acetaminophen (PERCOCET/ROXICET) 5-325 MG per tablet    Sig: Take 1 tablet by mouth daily as needed.    Dispense:  30 tablet    Refill:  0    Order Specific Question:  Supervising Provider    Answer:  Merlyn AlbertLUKING, WILLIAM S [[id number]]  . sulfamethoxazole-trimethoprim (BACTRIM DS,SEPTRA DS) 800-160 MG per tablet    Sig: Take 1 tablet by mouth 2 (two) times daily.    Dispense:  14 tablet    Refill:  0    Order Specific Question:  Supervising Provider    Answer:  Merlyn AlbertLUKING, WILLIAM S [[id number]]   Warning signs reviewed. Call back if worsens. Will send note to referrals about surgeon willing to work on payment plan.

## 2014-02-16 ENCOUNTER — Telehealth: Payer: Self-pay | Admitting: Family Medicine

## 2014-02-16 NOTE — Telephone Encounter (Signed)
Calling to check on status of referral.

## 2014-02-17 ENCOUNTER — Encounter: Payer: Self-pay | Admitting: *Deleted

## 2014-02-17 NOTE — Telephone Encounter (Signed)
appt made, pt aware

## 2014-02-19 ENCOUNTER — Telehealth: Payer: Self-pay | Admitting: Family Medicine

## 2014-02-19 NOTE — Telephone Encounter (Signed)
Pt called about this. Please advise.

## 2014-02-19 NOTE — Telephone Encounter (Signed)
Forgot to put this message in the other day.  Pt stated during phone call of notifying her of her surgeon appointment that the Bactrim seemed to make her bump worse & now a couple others have appeared.  Also she about out of the pain med given due to the pain these places are causing when walking.    Can we write for more pain meds until she sees surgeon 02/26/2014?  Should we change her antibiotic?   Please advise

## 2014-02-22 ENCOUNTER — Other Ambulatory Visit: Payer: Self-pay | Admitting: Nurse Practitioner

## 2014-02-22 MED ORDER — OXYCODONE-ACETAMINOPHEN 5-325 MG PO TABS: 1.0000 | ORAL_TABLET | Freq: Every day | ORAL | Status: DC | PRN

## 2014-02-22 MED ORDER — DOXYCYCLINE HYCLATE 100 MG PO TABS: 100.0000 mg | ORAL_TABLET | Freq: Two times a day (BID) | ORAL | Status: DC

## 2014-02-22 NOTE — Telephone Encounter (Signed)
Yes. Will go ahead and switch antibiotic. She will need to pick up Rx for pain med. Follow up with surgeon as planned. Recheck with us in the meantime if fever or significant changes in cysts.

## 2014-02-23 NOTE — Telephone Encounter (Signed)
Patient notified and verbalized understanding. 

## 2014-02-26 ENCOUNTER — Ambulatory Visit: Payer: Self-pay | Admitting: General Surgery

## 2014-03-18 ENCOUNTER — Encounter: Payer: Self-pay | Admitting: *Deleted

## 2014-05-22 ENCOUNTER — Encounter: Payer: Self-pay | Admitting: Nurse Practitioner

## 2014-05-22 ENCOUNTER — Ambulatory Visit (INDEPENDENT_AMBULATORY_CARE_PROVIDER_SITE_OTHER): Payer: Self-pay | Admitting: Nurse Practitioner

## 2014-05-22 VITALS — BP 142/86 | Ht 67.0 in | Wt 201.0 lb

## 2014-05-22 DIAGNOSIS — I10 Essential (primary) hypertension: Secondary | ICD-10-CM

## 2014-05-22 DIAGNOSIS — G4700 Insomnia, unspecified: Secondary | ICD-10-CM

## 2014-05-22 DIAGNOSIS — N898 Other specified noninflammatory disorders of vagina: Secondary | ICD-10-CM

## 2014-05-22 DIAGNOSIS — N9489 Other specified conditions associated with female genital organs and menstrual cycle: Secondary | ICD-10-CM

## 2014-05-22 MED ORDER — ALPRAZOLAM 0.5 MG PO TABS: 0.5000 mg | ORAL_TABLET | Freq: Every evening | ORAL | Status: DC | PRN

## 2014-05-22 MED ORDER — OXYCODONE-ACETAMINOPHEN 5-325 MG PO TABS: 1.0000 | ORAL_TABLET | Freq: Every day | ORAL | Status: DC | PRN

## 2014-05-22 MED ORDER — LISINOPRIL 5 MG PO TABS: 5.0000 mg | ORAL_TABLET | Freq: Every day | ORAL | Status: DC

## 2014-05-26 ENCOUNTER — Encounter: Payer: Self-pay | Admitting: Nurse Practitioner

## 2014-05-26 NOTE — Progress Notes (Signed)
Subjective:   Presents for recheck. Patient states her amlodipine was not working, stopped taking medication. Requesting refill on her Percocet. Last refilled on 03/26/2014 area [facility] on 3/28. Her specialists are debating intervention. Takes medication mainly when she is working, usually one half tab twice a day. Does not usually needed on her days off. Very active job, Therapist, musicclimbing ladders at work. Sees Dr. Everardo AllEllison for her Luiz BlareGraves' disease. Had dizziness with losartan. Has not tried any other blood pressure pills. Takes Xanax for sleep.  Objective:   BP 142/86 mmHg  Ht 5\' 7"  (1.702 m)  Wt 201 lb (91.173 kg)  BMI 31.47 kg/m2  NAD. Alert, oriented. Lungs clear. Heart regular rate rhythm. Lower extremities no edema.  Assessment:  Problem List Items Addressed This Visit      Cardiovascular and Mediastinum   Essential hypertension, benign - Primary   Relevant Medications   lisinopril (PRINIVIL,ZESTRIL) tablet     Other   Insomnia    Other Visit Diagnoses    Vaginal mass          Plan:  Meds ordered this encounter  Medications  . DISCONTD: cyclobenzaprine (FLEXERIL) 10 MG tablet    Sig: Take 10 mg by mouth.  . DISCONTD: alprazolam (XANAX) 2 MG tablet    Sig: Take 2 mg by mouth.  . DISCONTD: amLODipine (NORVASC) 10 MG tablet    Sig: Take 10 mg by mouth.  . DISCONTD: levothyroxine (SYNTHROID, LEVOTHROID) 150 MCG tablet    Sig: Take by mouth.  . DISCONTD: oxyCODONE-acetaminophen (PERCOCET/ROXICET) 5-325 MG per tablet    Sig: Take by mouth.  Marland Kitchen. lisinopril (PRINIVIL,ZESTRIL) 5 MG tablet    Sig: Take 1 tablet (5 mg total) by mouth daily.    Dispense:  30 tablet    Refill:  5    Order Specific Question:  Supervising Provider    Answer:  Merlyn AlbertLUKING, WILLIAM S [[id number]]  . ALPRAZolam (XANAX) 0.5 MG tablet    Sig: Take 1 tablet (0.5 mg total) by mouth at bedtime as needed for sleep.    Dispense:  30 tablet    Refill:  2    May refill monthly    Order Specific Question:  Supervising Provider    Answer:  Merlyn AlbertLUKING, WILLIAM S [[id number]]  . oxyCODONE-acetaminophen (PERCOCET/ROXICET) 5-325 MG per tablet    Sig: Take 1 tablet by mouth daily as needed.    Dispense:  30 tablet    Refill:  0    Order Specific Question:  Supervising Provider    Answer:  Merlyn AlbertLUKING, WILLIAM S [[id number]]    due to her Graves' disease as well as uncontrolled hypertension, do not feel comfortable prescribing phentermine at this time. Encouraged healthy diet and regular activity. Also strongly recommend preventive health physical this year. Follow-up with her specialists as planned.

## 2014-06-15 ENCOUNTER — Telehealth: Payer: Self-pay | Admitting: Endocrinology

## 2014-06-15 ENCOUNTER — Other Ambulatory Visit: Payer: Self-pay | Admitting: Endocrinology

## 2014-06-15 NOTE — Telephone Encounter (Signed)
Pt having issues with levothyroxine med pharmacy is saying its expired. Please help

## 2014-06-15 NOTE — Telephone Encounter (Signed)
Rx sent to pharmacy for a 30 day supply. Pt needs appointment for further refills.

## 2014-06-18 ENCOUNTER — Emergency Department (HOSPITAL_COMMUNITY): Payer: Self-pay

## 2014-06-18 ENCOUNTER — Encounter (HOSPITAL_COMMUNITY): Payer: Self-pay | Admitting: Emergency Medicine

## 2014-06-18 ENCOUNTER — Emergency Department (HOSPITAL_COMMUNITY)
Admission: EM | Admit: 2014-06-18 | Discharge: 2014-06-18 | Disposition: A | Discharge status: Home / Self Care | Payer: Self-pay | Attending: Emergency Medicine | Admitting: Emergency Medicine

## 2014-06-18 DIAGNOSIS — G43909 Migraine, unspecified, not intractable, without status migrainosus: Secondary | ICD-10-CM | POA: Insufficient documentation

## 2014-06-18 DIAGNOSIS — Z720 Tobacco use: Secondary | ICD-10-CM | POA: Insufficient documentation

## 2014-06-18 DIAGNOSIS — E890 Postprocedural hypothyroidism: Secondary | ICD-10-CM | POA: Insufficient documentation

## 2014-06-18 DIAGNOSIS — R42 Dizziness and giddiness: Secondary | ICD-10-CM | POA: Insufficient documentation

## 2014-06-18 DIAGNOSIS — Z79899 Other long term (current) drug therapy: Secondary | ICD-10-CM | POA: Insufficient documentation

## 2014-06-18 LAB — CBC WITH DIFFERENTIAL/PLATELET
BASOS ABS: 0 10*3/uL (ref 0.0–0.1)
BASOS PCT: 0 % (ref 0–1)
EOS PCT: 2 % (ref 0–5)
Eosinophils Absolute: 0.1 10*3/uL (ref 0.0–0.7)
HCT: 40.6 % (ref 36.0–46.0)
Hemoglobin: 13.3 g/dL (ref 12.0–15.0)
LYMPHS PCT: 39 % (ref 12–46)
Lymphs Abs: 2.6 10*3/uL (ref 0.7–4.0)
MCH: 29.8 pg (ref 26.0–34.0)
MCHC: 32.8 g/dL (ref 30.0–36.0)
MCV: 91 fL (ref 78.0–100.0)
Monocytes Absolute: 0.5 10*3/uL (ref 0.1–1.0)
Monocytes Relative: 8 % (ref 3–12)
NEUTROS ABS: 3.4 10*3/uL (ref 1.7–7.7)
Neutrophils Relative %: 51 % (ref 43–77)
PLATELETS: 239 10*3/uL (ref 150–400)
RBC: 4.46 MIL/uL (ref 3.87–5.11)
RDW: 13.7 % (ref 11.5–15.5)
WBC: 6.7 10*3/uL (ref 4.0–10.5)

## 2014-06-18 LAB — BASIC METABOLIC PANEL
Anion gap: 6 (ref 5–15)
BUN: 14 mg/dL (ref 6–23)
CALCIUM: 9 mg/dL (ref 8.4–10.5)
CO2: 25 mmol/L (ref 19–32)
Chloride: 108 mmol/L (ref 96–112)
Creatinine, Ser: 0.94 mg/dL (ref 0.50–1.10)
GFR, EST NON AFRICAN AMERICAN: 79 mL/min — AB (ref >90)
Glucose, Bld: 122 mg/dL — ABNORMAL HIGH (ref 70–99)
POTASSIUM: 3.7 mmol/L (ref 3.5–5.1)
Sodium: 139 mmol/L (ref 135–145)

## 2014-06-18 LAB — TROPONIN I: Troponin I: <0.03 ng/mL (ref <0.031)

## 2014-06-18 LAB — URINALYSIS, ROUTINE W REFLEX MICROSCOPIC
Bilirubin Urine: NEGATIVE
Glucose, UA: NEGATIVE mg/dL
Hgb urine dipstick: NEGATIVE
Ketones, ur: NEGATIVE mg/dL
Nitrite: NEGATIVE
Protein, ur: NEGATIVE mg/dL
Specific Gravity, Urine: 1.015 (ref 1.005–1.030)
Urobilinogen, UA: 0.2 mg/dL (ref 0.0–1.0)
pH: 6.5 (ref 5.0–8.0)

## 2014-06-18 LAB — RAPID URINE DRUG SCREEN, HOSP PERFORMED
Amphetamines: NOT DETECTED
BARBITURATES: NOT DETECTED
Benzodiazepines: NOT DETECTED
COCAINE: NOT DETECTED
Opiates: NOT DETECTED
TETRAHYDROCANNABINOL: NOT DETECTED

## 2014-06-18 LAB — URINE MICROSCOPIC-ADD ON

## 2014-06-18 MED ORDER — SODIUM CHLORIDE 0.9 % IV SOLN
1000.0000 mL | Freq: Once | INTRAVENOUS | Status: AC
  Administered 2014-06-18: 1000 mL via INTRAVENOUS

## 2014-06-18 MED ORDER — SODIUM CHLORIDE 0.9 % IV BOLUS (SEPSIS)
1000.0000 mL | Freq: Once | INTRAVENOUS | Status: AC
  Administered 2014-06-18: 1000 mL via INTRAVENOUS

## 2014-06-18 MED ORDER — MECLIZINE HCL 25 MG PO TABS: ORAL_TABLET | ORAL | Status: DC

## 2014-06-18 MED ORDER — MECLIZINE HCL 12.5 MG PO TABS
25.0000 mg | ORAL_TABLET | Freq: Once | ORAL | Status: AC
  Administered 2014-06-18: 25 mg via ORAL
  Filled 2014-06-18: qty 2

## 2014-06-18 MED ORDER — PROMETHAZINE HCL 25 MG PO TABS: 25.0000 mg | ORAL_TABLET | Freq: Four times a day (QID) | ORAL | Status: DC | PRN

## 2014-06-18 MED ORDER — DIAZEPAM 5 MG PO TABS
5.0000 mg | ORAL_TABLET | Freq: Once | ORAL | Status: AC
  Administered 2014-06-18: 5 mg via ORAL
  Filled 2014-06-18: qty 1

## 2014-06-18 NOTE — ED Notes (Signed)
Patient states she feels like she's been drugged; states has felt "funny" all day.  States took a nap and woke up feeling the same.  Patient states her heart has been fluttering and continues to feel dizzy.

## 2014-06-18 NOTE — ED Provider Notes (Signed)
CSN: [id number]     Arrival date & time 06/18/14  1955 History   First MD Initiated Contact with Patient 06/18/14 2011     Chief Complaint  Patient presents with  . Dizziness     (Consider location/radiation/quality/duration/timing/severity/associated sxs/prior Treatment) HPI  This is a 34 year old female with a history of hypothyroidism, migraines, and anxiety who presents with multiple symptoms. Patient reports that she "feels funny." She states that she felt fine earlier today and then all of a sudden she just didn't feel right. She took a nap and woke up feeling lightheaded and dizzy. She describes the dizziness as swimmy headed.   Denies syncope or room spinning dizziness. She reports that she is seeing spots in her vision and her dizziness tends to get worse with movement of her head. She denies any nausea, vomiting, or diarrhea. She denies any fevers. She denies any weakness, numbness, tingling or difficulty ambulating. Patient reports that when she got here she had onset of chest pain. It was over the right chest and she describes it as a "grabbing pain." It is currently 4 out of 10. Denies any history of PE, recent hospitalization, leg swelling.  Past Medical History  Diagnosis Date  . ANXIETY 08/30/2007  . GRAVES' DISEASE 12/07/2008  . 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 INSOMNIA 08/30/2007  . MIGRAINE HEADACHE 08/30/2007  . Depression    Past Surgical History  Procedure Laterality Date  . Tubal ligation  2003   Family History  Problem Relation Age of Onset  . Thyroid disease Neg Hx   . Hypertension Mother    History  Substance Use Topics  . Smoking status: Current Every Day Smoker    Last Attempt to Quit: 06/29/2012  . Smokeless tobacco: Not on file  . Alcohol Use: 0.0 oz/week    0 Standard drinks or equivalent per week   OB History    No data available     Review of Systems  Constitutional: Negative for fever.  Respiratory: Negative for cough, chest  tightness and shortness of breath.   Cardiovascular: Positive for chest pain. Negative for leg swelling.  Gastrointestinal: Negative for nausea, vomiting, abdominal pain and diarrhea.  Genitourinary: Negative for dysuria.  Musculoskeletal: Negative for back pain.  Neurological: Positive for dizziness and light-headedness. Negative for weakness, numbness and headaches.  Psychiatric/Behavioral: Negative for confusion.  All other systems reviewed and are negative.     Allergies  Celexa and Zoloft  Home Medications   Prior to Admission medications   Medication Sig Start Date End Date Taking? Authorizing Provider  ALPRAZolam Prudy Feeler(XANAX) 0.5 MG tablet Take 1 tablet (0.5 mg total) by mouth at bedtime as needed for sleep. 05/22/14  Yes Campbell Richesarolyn C Hoskins, NP  levothyroxine (SYNTHROID, LEVOTHROID) 125 MCG tablet Take 125 mcg by mouth daily before breakfast. Alternating with 175mcg.   Yes Historical Provider, MD  levothyroxine (SYNTHROID, LEVOTHROID) 175 MCG tablet Take 175 mcg by mouth daily before breakfast. Alternating with 125mcg   Yes Historical Provider, MD  lisinopril (PRINIVIL,ZESTRIL) 5 MG tablet Take 1 tablet (5 mg total) by mouth daily. 05/22/14  Yes Campbell Richesarolyn C Hoskins, NP  oxyCODONE-acetaminophen (PERCOCET/ROXICET) 5-325 MG per tablet Take 1 tablet by mouth daily as needed. 05/22/14  Yes Campbell Richesarolyn C Hoskins, NP  levothyroxine (SYNTHROID, LEVOTHROID) 150 MCG tablet Take 1 tablet by mouth once daily before breakfast. APPOINTMENT NEEDED FOR FURTHER REFILLS 06/15/14   Romero BellingSean Ellison, MD   BP 143/91 mmHg  Pulse 71  Temp(Src) 97.8 F (36.6 C) (Oral)  Resp 16  Ht  (1.702 m)  Wt 209 lb (94.802 kg)  BMI 32.73 kg/m2  SpO2 100%  LMP 06/11/2014 (Approximate) Physical Exam  Constitutional: She is oriented to person, place, and time. She appears well-developed and well-nourished. No distress.  HENT:  Head: Normocephalic and atraumatic.  Eyes: EOM are normal. Pupils are equal, round, and reactive to  light.  Horizontal nystagmus noted with extraocular movements  Neck: Neck supple.  Cardiovascular: Normal rate, regular rhythm and normal heart sounds.   No murmur heard. Pulmonary/Chest: Effort normal and breath sounds normal. No respiratory distress. She has no wheezes.  Abdominal: Soft. Bowel sounds are normal. There is no tenderness. There is no rebound.  Neurological: She is alert and oriented to person, place, and time. Coordination normal.  No dysmetria to finger-nose-finger  Skin: Skin is warm and dry.  Psychiatric: She has a normal mood and affect.  Nursing note and vitals reviewed.   ED Course  Procedures (including critical care time) Labs Review Labs Reviewed  BASIC METABOLIC PANEL - Abnormal; Notable for the following:    Glucose, Bld 122 (*)    GFR calc non Af Amer 79 (*)    All other components within normal limits  CBC WITH DIFFERENTIAL/PLATELET  TROPONIN I  URINE RAPID DRUG SCREEN (HOSP PERFORMED)  URINALYSIS, ROUTINE W REFLEX MICROSCOPIC    Imaging Review No results found.   EKG Interpretation   Date/Time:  Thursday June 18 2014 20:35:50 EDT Ventricular Rate:  61 PR Interval:  176 QRS Duration: 69 QT Interval:  422 QTC Calculation: 425 R Axis:   64 Text Interpretation:  Sinus rhythm Confirmed by Domnique Vantine  MD, Blakelynn Scheeler  ([id number]) on 06/18/2014 9:40:26 PM      MDM   Final diagnoses:  None    Patient presents with "funny feeling," dizziness and CP.  Nontoxic on exam.  VS reassuring.  Nonfocal and no evidence of cerebellar dysfunction.  Some portions of history consistent with peripheral vertigo.  Patient given meclizine and valium.  Regarding CP, somewhat atypical.  Doubt ACS.  EKG reassuring and trop neg.  PERC neg.  CXR pending.  UA pending.    Patient signed out to Dr. Estell Harpin pending CXR and UA, recheck.    Shon Baton, MD 06/20/14 1501

## 2014-06-18 NOTE — Discharge Instructions (Signed)
Follow up with your md next week. °

## 2014-06-19 ENCOUNTER — Telehealth: Payer: Self-pay | Admitting: Endocrinology

## 2014-06-19 NOTE — Telephone Encounter (Signed)
Patient stated that she need more refill on medication levothyroxine. Please advise

## 2014-06-22 NOTE — Telephone Encounter (Signed)
See note below. Pt has two dosages for the Levothyroxine listed on her current medication list. Please advise which strength is correct.  Thanks!

## 2014-06-22 NOTE — Telephone Encounter (Signed)
There have been multiple adjustments since i last saw this patients.  If i am to rx this, i would need to see pt back for ov

## 2014-06-22 NOTE — Telephone Encounter (Signed)
Left voicemail advising of note below. Requested call back from pt to schedule office visit.

## 2014-06-22 NOTE — Telephone Encounter (Signed)
There have been multiple adjustments since i last saw this patients.  If i am to rx this, i would need to see pt back for ov 

## 2014-07-16 ENCOUNTER — Telehealth: Payer: Self-pay | Admitting: Family Medicine

## 2014-07-16 MED ORDER — AZITHROMYCIN 500 MG PO TABS: 1000.0000 mg | ORAL_TABLET | Freq: Once | ORAL | Status: DC

## 2014-07-16 NOTE — Telephone Encounter (Signed)
Rx sent electronically to pharmacy. Patient notified. 

## 2014-07-16 NOTE — Telephone Encounter (Signed)
Patients boyfriend was diagnosed with chlamydia at the Emergency room and given a prescription to clear this up.  She is wanting to know if we can call something in for her as well?  [facility] [place]

## 2014-07-16 NOTE — Telephone Encounter (Signed)
Consult with Eber Jonesarolyn- Zithromax 1gm po x one dose

## 2014-08-03 ENCOUNTER — Ambulatory Visit: Payer: Self-pay | Admitting: Endocrinology

## 2014-08-03 ENCOUNTER — Telehealth: Payer: Self-pay | Admitting: Endocrinology

## 2014-08-03 MED ORDER — LEVOTHYROXINE SODIUM 150 MCG PO TABS: ORAL_TABLET | ORAL | Status: DC

## 2014-08-03 NOTE — Telephone Encounter (Signed)
See note below. Pt has a appointment on 08/10/2014. Ok to refill Levothyroxine 150mcg? Thanks!

## 2014-08-03 NOTE — Telephone Encounter (Signed)
Ok, please refill x 1  

## 2014-08-03 NOTE — Telephone Encounter (Signed)
Patient need a refill of levothyroxine to last her until her last appt

## 2014-08-03 NOTE — Telephone Encounter (Signed)
Rx sent for 150 mcg sent.

## 2014-08-10 ENCOUNTER — Ambulatory Visit: Payer: Self-pay | Admitting: Endocrinology

## 2014-08-19 ENCOUNTER — Ambulatory Visit: Payer: Self-pay | Admitting: Endocrinology

## 2014-08-21 ENCOUNTER — Ambulatory Visit: Payer: Self-pay | Admitting: Endocrinology

## 2014-08-28 ENCOUNTER — Ambulatory Visit: Payer: Self-pay | Admitting: Endocrinology

## 2014-09-06 ENCOUNTER — Encounter (HOSPITAL_COMMUNITY): Payer: Self-pay | Admitting: *Deleted

## 2014-09-06 ENCOUNTER — Emergency Department (HOSPITAL_COMMUNITY)
Admission: EM | Admit: 2014-09-06 | Discharge: 2014-09-06 | Disposition: A | Discharge status: Home / Self Care | Payer: Self-pay | Attending: Emergency Medicine | Admitting: Emergency Medicine

## 2014-09-06 DIAGNOSIS — Z79899 Other long term (current) drug therapy: Secondary | ICD-10-CM | POA: Insufficient documentation

## 2014-09-06 DIAGNOSIS — Z720 Tobacco use: Secondary | ICD-10-CM | POA: Insufficient documentation

## 2014-09-06 DIAGNOSIS — F419 Anxiety disorder, unspecified: Secondary | ICD-10-CM | POA: Insufficient documentation

## 2014-09-06 DIAGNOSIS — F329 Major depressive disorder, single episode, unspecified: Secondary | ICD-10-CM | POA: Insufficient documentation

## 2014-09-06 DIAGNOSIS — K649 Unspecified hemorrhoids: Secondary | ICD-10-CM | POA: Insufficient documentation

## 2014-09-06 DIAGNOSIS — E039 Hypothyroidism, unspecified: Secondary | ICD-10-CM | POA: Insufficient documentation

## 2014-09-06 DIAGNOSIS — Z91040 Latex allergy status: Secondary | ICD-10-CM | POA: Insufficient documentation

## 2014-09-06 DIAGNOSIS — G43909 Migraine, unspecified, not intractable, without status migrainosus: Secondary | ICD-10-CM | POA: Insufficient documentation

## 2014-09-06 MED ORDER — OXYCODONE-ACETAMINOPHEN 5-325 MG PO TABS
1.0000 | ORAL_TABLET | ORAL | Status: DC | PRN
Start: 2014-09-06 — End: 2014-10-30

## 2014-09-06 MED ORDER — HYDROCORTISONE ACETATE 25 MG RE SUPP
25.0000 mg | Freq: Once | RECTAL | Status: AC
  Administered 2014-09-06: 25 mg via RECTAL
  Filled 2014-09-06: qty 1

## 2014-09-06 MED ORDER — HYDROCORTISONE ACETATE 25 MG RE SUPP
RECTAL | Status: AC
  Filled 2014-09-06: qty 1

## 2014-09-06 MED ORDER — OXYCODONE-ACETAMINOPHEN 5-325 MG PO TABS
1.0000 | ORAL_TABLET | Freq: Once | ORAL | Status: AC
  Administered 2014-09-06: 1 via ORAL
  Filled 2014-09-06: qty 1

## 2014-09-06 MED ORDER — HYDROCORTISONE ACETATE 25 MG RE SUPP: 25.0000 mg | Freq: Two times a day (BID) | RECTAL | Status: DC

## 2014-09-06 NOTE — ED Provider Notes (Signed)
CSN: [id number]     Arrival date & time 09/06/14  1902 History   First MD Initiated Contact with Patient 09/06/14 1933     Chief Complaint  Patient presents with  . Rectal Pain     (Consider location/radiation/quality/duration/timing/severity/associated sxs/prior Treatment) The history is provided by the patient.   Teresa Vincent is a 34 y.o. female who complains of pain at her anus, with a palpable nodule, since last night. No known anal trauma. No prior similar problem. Denies constipation, fever, chills, nausea, vomiting. She has not tried anything for it yet. There are no other known modifying factors.    Past Medical History  Diagnosis Date  . ANXIETY 08/30/2007  . GRAVES' DISEASE 12/07/2008  . 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 INSOMNIA 08/30/2007  . MIGRAINE HEADACHE 08/30/2007  . Depression    Past Surgical History  Procedure Laterality Date  . Tubal ligation  2003   Family History  Problem Relation Age of Onset  . Thyroid disease Neg Hx   . Hypertension Mother    History  Substance Use Topics  . Smoking status: Current Every Day Smoker    Last Attempt to Quit: 06/29/2012  . Smokeless tobacco: Not on file  . Alcohol Use: 0.0 oz/week    0 Standard drinks or equivalent per week   OB History    No data available     Review of Systems  All other systems reviewed and are negative.     Allergies  Adhesive; Celexa; Latex; and Phenergan  Home Medications   Prior to Admission medications   Medication Sig Start Date End Date Taking? Authorizing Provider  ALPRAZolam Prudy Feeler) 0.5 MG tablet Take 1 tablet (0.5 mg total) by mouth at bedtime as needed for sleep. Patient taking differently: Take 0.25-0.5 mg by mouth at bedtime as needed for sleep (Restless legs).  05/22/14  Yes Campbell Riches, NP  levothyroxine (SYNTHROID, LEVOTHROID) 150 MCG tablet Take 1 tablet by mouth once daily before breakfast. APPOINTMENT NEEDED FOR FURTHER REFILLS 08/03/14  Yes  Romero Belling, MD  lisinopril (PRINIVIL,ZESTRIL) 5 MG tablet Take 1 tablet (5 mg total) by mouth daily. 05/22/14  Yes Campbell Riches, NP  azithromycin (ZITHROMAX) 500 MG tablet Take 2 tablets (1,000 mg total) by mouth once. 07/16/14   Campbell Riches, NP  hydrocortisone (ANUSOL-HC) 25 MG suppository Place 1 suppository (25 mg total) rectally 2 (two) times daily. For 7 days 09/06/14   Mancel Bale, MD  levothyroxine (SYNTHROID, LEVOTHROID) 175 MCG tablet Take 175 mcg by mouth daily before breakfast. Alternating with    Historical Provider, MD  meclizine (ANTIVERT) 25 MG tablet Take one every 6 hours for dizziness 06/18/14   Bethann Berkshire, MD  oxyCODONE-acetaminophen (PERCOCET) 5-325 MG per tablet Take 1 tablet by mouth every 4 (four) hours as needed for severe pain. 09/06/14   Mancel Bale, MD   BP 142/85 mmHg  Pulse 90  Temp(Src) 98.1 F (36.7 C) (Oral)  Resp 20  Ht  (1.702 m)  Wt 200 lb (90.719 kg)  BMI 31.32 kg/m2  SpO2 100%  LMP 09/06/2014 Physical Exam  Constitutional: She is oriented to person, place, and time. She appears well-developed and well-nourished.  HENT:  Head: Normocephalic and atraumatic.  Right Ear: External ear normal.  Left Ear: External ear normal.  Eyes: Conjunctivae and EOM are normal. Pupils are equal, round, and reactive to light.  Neck: Normal range of motion and phonation normal. Neck supple.  Cardiovascular: Normal rate.  Pulmonary/Chest: Effort normal. She exhibits no bony tenderness.  Genitourinary:  Small right posterior anal hemorrhoid, nonthrombosed, nonbleeding. The area around the hemorrhoid is very tender, and this prevents digital rectal examination.  Musculoskeletal: Normal range of motion.  Neurological: She is alert and oriented to person, place, and time. No cranial nerve deficit or sensory deficit. She exhibits normal muscle tone. Coordination normal.  Skin: Skin is warm, dry and intact.  Psychiatric: She has a normal mood and  affect. Her behavior is normal. Judgment and thought content normal.  Nursing note and vitals reviewed.   ED Course  Procedures (including critical care time)  Medications  hydrocortisone (ANUSOL-HC) suppository 25 mg (25 mg Rectal Given 09/06/14 2023)  oxyCODONE-acetaminophen (PERCOCET/ROXICET) 5-325 MG per tablet 1 tablet (1 tablet Oral Given 09/06/14 2017)    Patient Vitals for the past 24 hrs:  BP Temp Temp src Pulse Resp SpO2 Height Weight  09/06/14 1928 142/85 mmHg 98.1 F (36.7 C) Oral 90 20 100 % 5\' 7"  (1.702 m) 200 lb (90.719 kg)    Findings discussed with patient, all questions answered.  Labs Review Labs Reviewed - No data to display  Imaging Review No results found.   EKG Interpretation None      MDM   Final diagnoses:  Hemorrhoids, unspecified hemorrhoid type    Anal hemorrhoid without evidence for infection, abscess, fissure or serious bacterial infection.  Nursing Notes Reviewed/ Care Coordinated Applicable Imaging Reviewed Interpretation of Laboratory Data incorporated into ED treatment  The patient appears reasonably screened and/or stabilized for discharge and I doubt any other medical condition or other [facility] requiring further screening, evaluation, or treatment in the ED at this time prior to discharge.  Plan: Home Medications- hydrocortisone suppository, Percocet; Home Treatments- warm soaks; return here if the recommended treatment, does not improve the symptoms; Recommended follow up- PCP when necessary    Mancel Bale, MD 09/06/14 2037

## 2014-09-06 NOTE — Discharge Instructions (Signed)
Soak in a warm tub 2-3 times a day to help with the discomfort. Use a stool softener such as Colace, twice a day, to help keep the stool soft. After stooling, clean the anus with a moist towelette.   Hemorrhoids Hemorrhoids are swollen veins around the rectum or anus. There are two types of hemorrhoids:   Internal hemorrhoids. These occur in the veins just inside the rectum. They may poke through to the outside and become irritated and painful.  External hemorrhoids. These occur in the veins outside the anus and can be felt as a painful swelling or hard lump near the anus. CAUSES  Pregnancy.   Obesity.   Constipation or diarrhea.   Straining to have a bowel movement.   Sitting for long periods on the toilet.  Heavy lifting or other activity that caused you to strain.  Anal intercourse. SYMPTOMS   Pain.   Anal itching or irritation.   Rectal bleeding.   Fecal leakage.   Anal swelling.   One or more lumps around the anus.  DIAGNOSIS  Your caregiver may be able to diagnose hemorrhoids by visual examination. Other examinations or tests that may be performed include:   Examination of the rectal area with a gloved hand (digital rectal exam).   Examination of anal canal using a small tube (scope).   A blood test if you have lost a significant amount of blood.  A test to look inside the colon (sigmoidoscopy or colonoscopy). TREATMENT Most hemorrhoids can be treated at home. However, if symptoms do not seem to be getting better or if you have a lot of rectal bleeding, your caregiver may perform a procedure to help make the hemorrhoids get smaller or remove them completely. Possible treatments include:   Placing a rubber band at the base of the hemorrhoid to cut off the circulation (rubber band ligation).   Injecting a chemical to shrink the hemorrhoid (sclerotherapy).   Using a tool to burn the hemorrhoid (infrared light therapy).   Surgically removing  the hemorrhoid (hemorrhoidectomy).   Stapling the hemorrhoid to block blood flow to the tissue (hemorrhoid stapling).  HOME CARE INSTRUCTIONS   Eat foods with fiber, such as whole grains, beans, nuts, fruits, and vegetables. Ask your doctor about taking products with added fiber in them (fibersupplements).  Increase fluid intake. Drink enough water and fluids to keep your urine clear or pale yellow.   Exercise regularly.   Go to the bathroom when you have the urge to have a bowel movement. Do not wait.   Avoid straining to have bowel movements.   Keep the anal area dry and clean. Use wet toilet paper or moist towelettes after a bowel movement.   Medicated creams and suppositories may be used or applied as directed.   Only take over-the-counter or prescription medicines as directed by your caregiver.   Take warm sitz baths for 15-20 minutes, 3-4 times a day to ease pain and discomfort.   Place ice packs on the hemorrhoids if they are tender and swollen. Using ice packs between sitz baths may be helpful.   Put ice in a plastic bag.   Place a towel between your skin and the bag.   Leave the ice on for 15-20 minutes, 3-4 times a day.   Do not use a donut-shaped pillow or sit on the toilet for long periods. This increases blood pooling and pain.  SEEK MEDICAL CARE IF:  You have increasing pain and swelling that is not controlled  by treatment or medicine.  You have uncontrolled bleeding.  You have difficulty or you are unable to have a bowel movement.  You have pain or inflammation outside the area of the hemorrhoids. MAKE SURE YOU:  Understand these instructions.  Will watch your condition.  Will get help right away if you are not doing well or get worse. Document Released: 03/03/2000 Document Revised: 02/21/2012 Document Reviewed: 01/09/2012 [facility] Patient Information 2015 [place], [place]. This information is not intended to replace advice given to you  by your health care provider. Make sure you discuss any questions you have with your health care provider.

## 2014-09-06 NOTE — ED Notes (Signed)
Pt c/o rectal pain, right buttock area that started this am, last bowel movement was this am, pain started before bowel movement, denies any blood in her stool,

## 2014-09-08 ENCOUNTER — Encounter (HOSPITAL_COMMUNITY): Payer: Self-pay | Admitting: *Deleted

## 2014-09-08 ENCOUNTER — Emergency Department (HOSPITAL_COMMUNITY)
Admission: EM | Admit: 2014-09-08 | Discharge: 2014-09-09 | Disposition: A | Discharge status: Home / Self Care | Payer: Self-pay | Attending: Emergency Medicine | Admitting: Emergency Medicine

## 2014-09-08 DIAGNOSIS — Z91040 Latex allergy status: Secondary | ICD-10-CM | POA: Insufficient documentation

## 2014-09-08 DIAGNOSIS — Z720 Tobacco use: Secondary | ICD-10-CM | POA: Insufficient documentation

## 2014-09-08 DIAGNOSIS — Z7952 Long term (current) use of systemic steroids: Secondary | ICD-10-CM | POA: Insufficient documentation

## 2014-09-08 DIAGNOSIS — E039 Hypothyroidism, unspecified: Secondary | ICD-10-CM | POA: Insufficient documentation

## 2014-09-08 DIAGNOSIS — K644 Residual hemorrhoidal skin tags: Secondary | ICD-10-CM | POA: Insufficient documentation

## 2014-09-08 DIAGNOSIS — K645 Perianal venous thrombosis: Secondary | ICD-10-CM

## 2014-09-08 DIAGNOSIS — F329 Major depressive disorder, single episode, unspecified: Secondary | ICD-10-CM | POA: Insufficient documentation

## 2014-09-08 DIAGNOSIS — G43909 Migraine, unspecified, not intractable, without status migrainosus: Secondary | ICD-10-CM | POA: Insufficient documentation

## 2014-09-08 DIAGNOSIS — F419 Anxiety disorder, unspecified: Secondary | ICD-10-CM | POA: Insufficient documentation

## 2014-09-08 DIAGNOSIS — Z9851 Tubal ligation status: Secondary | ICD-10-CM | POA: Insufficient documentation

## 2014-09-08 LAB — CBC WITH DIFFERENTIAL/PLATELET
Basophils Absolute: 0 10*3/uL (ref 0.0–0.1)
Basophils Relative: 0 % (ref 0–1)
Eosinophils Absolute: 0.2 10*3/uL (ref 0.0–0.7)
Eosinophils Relative: 2 % (ref 0–5)
HCT: 40.1 % (ref 36.0–46.0)
Hemoglobin: 13.3 g/dL (ref 12.0–15.0)
LYMPHS PCT: 38 % (ref 12–46)
Lymphs Abs: 2.8 10*3/uL (ref 0.7–4.0)
MCH: 30.4 pg (ref 26.0–34.0)
MCHC: 33.2 g/dL (ref 30.0–36.0)
MCV: 91.6 fL (ref 78.0–100.0)
Monocytes Absolute: 0.5 10*3/uL (ref 0.1–1.0)
Monocytes Relative: 7 % (ref 3–12)
NEUTROS ABS: 3.9 10*3/uL (ref 1.7–7.7)
Neutrophils Relative %: 53 % (ref 43–77)
PLATELETS: 219 10*3/uL (ref 150–400)
RBC: 4.38 MIL/uL (ref 3.87–5.11)
RDW: 13.7 % (ref 11.5–15.5)
WBC: 7.4 10*3/uL (ref 4.0–10.5)

## 2014-09-08 LAB — BASIC METABOLIC PANEL
ANION GAP: 5 (ref 5–15)
BUN: 13 mg/dL (ref 6–20)
CO2: 27 mmol/L (ref 22–32)
Calcium: 8.6 mg/dL — ABNORMAL LOW (ref 8.9–10.3)
Chloride: 106 mmol/L (ref 101–111)
Creatinine, Ser: 1.07 mg/dL — ABNORMAL HIGH (ref 0.44–1.00)
GFR calc Af Amer: >60 mL/min (ref >60)
GLUCOSE: 100 mg/dL — AB (ref 65–99)
Potassium: 3.6 mmol/L (ref 3.5–5.1)
SODIUM: 138 mmol/L (ref 135–145)

## 2014-09-08 MED ORDER — LIDOCAINE HCL 2 % EX GEL
1.0000 "application " | Freq: Once | CUTANEOUS | Status: AC
  Administered 2014-09-09: 1 via TOPICAL
  Filled 2014-09-08: qty 10

## 2014-09-08 MED ORDER — HYDROCORTISONE 2.5 % RE CREA
TOPICAL_CREAM | Freq: Three times a day (TID) | RECTAL | Status: DC
  Administered 2014-09-09: 01:00:00 via RECTAL
  Filled 2014-09-08: qty 28.35

## 2014-09-08 MED ORDER — LIDOCAINE HCL 2 % EX GEL: 1.0000 "application " | Freq: Two times a day (BID) | CUTANEOUS | Status: DC

## 2014-09-08 MED ORDER — HYDROCORTISONE 2.5 % RE CREA: TOPICAL_CREAM | Freq: Three times a day (TID) | RECTAL | Status: DC

## 2014-09-08 NOTE — ED Notes (Signed)
Pt was seen here for a hemorrhoid on the 19th. Pt states it began to bleed yesterday around 11am.

## 2014-09-08 NOTE — Discharge Instructions (Signed)
You were seen today for a hemorrhoid. There does not appear to be any active bleeding. The hemorrhoid does appear thrombosed. You will be given cream and should continue sitz baths. You will be given the number for the general surgery clinic. You can call to make an appointment as soon as possible.  Hemorrhoids Hemorrhoids are swollen veins around the rectum or anus. There are two types of hemorrhoids:   Internal hemorrhoids. These occur in the veins just inside the rectum. They may poke through to the outside and become irritated and painful.  External hemorrhoids. These occur in the veins outside the anus and can be felt as a painful swelling or hard lump near the anus. CAUSES  Pregnancy.   Obesity.   Constipation or diarrhea.   Straining to have a bowel movement.   Sitting for long periods on the toilet.  Heavy lifting or other activity that caused you to strain.  Anal intercourse. SYMPTOMS   Pain.   Anal itching or irritation.   Rectal bleeding.   Fecal leakage.   Anal swelling.   One or more lumps around the anus.  DIAGNOSIS  Your caregiver may be able to diagnose hemorrhoids by visual examination. Other examinations or tests that may be performed include:   Examination of the rectal area with a gloved hand (digital rectal exam).   Examination of anal canal using a small tube (scope).   A blood test if you have lost a significant amount of blood.  A test to look inside the colon (sigmoidoscopy or colonoscopy). TREATMENT Most hemorrhoids can be treated at home. However, if symptoms do not seem to be getting better or if you have a lot of rectal bleeding, your caregiver may perform a procedure to help make the hemorrhoids get smaller or remove them completely. Possible treatments include:   Placing a rubber band at the base of the hemorrhoid to cut off the circulation (rubber band ligation).   Injecting a chemical to shrink the hemorrhoid  (sclerotherapy).   Using a tool to burn the hemorrhoid (infrared light therapy).   Surgically removing the hemorrhoid (hemorrhoidectomy).   Stapling the hemorrhoid to block blood flow to the tissue (hemorrhoid stapling).  HOME CARE INSTRUCTIONS   Eat foods with fiber, such as whole grains, beans, nuts, fruits, and vegetables. Ask your doctor about taking products with added fiber in them (fibersupplements).  Increase fluid intake. Drink enough water and fluids to keep your urine clear or pale yellow.   Exercise regularly.   Go to the bathroom when you have the urge to have a bowel movement. Do not wait.   Avoid straining to have bowel movements.   Keep the anal area dry and clean. Use wet toilet paper or moist towelettes after a bowel movement.   Medicated creams and suppositories may be used or applied as directed.   Only take over-the-counter or prescription medicines as directed by your caregiver.   Take warm sitz baths for 15-20 minutes, 3-4 times a day to ease pain and discomfort.   Place ice packs on the hemorrhoids if they are tender and swollen. Using ice packs between sitz baths may be helpful.   Put ice in a plastic bag.   Place a towel between your skin and the bag.   Leave the ice on for 15-20 minutes, 3-4 times a day.   Do not use a donut-shaped pillow or sit on the toilet for long periods. This increases blood pooling and pain.  SEEK MEDICAL  CARE IF:  You have increasing pain and swelling that is not controlled by treatment or medicine.  You have uncontrolled bleeding.  You have difficulty or you are unable to have a bowel movement.  You have pain or inflammation outside the area of the hemorrhoids. MAKE SURE YOU:  Understand these instructions.  Will watch your condition.  Will get help right away if you are not doing well or get worse. Document Released: 03/03/2000 Document Revised: 02/21/2012 Document Reviewed:  01/09/2012 [facility] Patient Information 2015 [place], [place]. This information is not intended to replace advice given to you by your health care provider. Make sure you discuss any questions you have with your health care provider.

## 2014-09-08 NOTE — ED Provider Notes (Signed)
CSN: [id number]     Arrival date & time 09/08/14  2050 History   This chart was scribed for Teresa Baton, MD by Abel Presto, ED Scribe. This patient was seen in room APA03/APA03 and the patient's care was started at 11:03 PM.    Chief Complaint  Patient presents with  . Rectal Bleeding      The history is provided by the patient. No language interpreter was used.   HPI Comments: Teresa Vincent is a 34 y.o. female who presents to the Emergency Department complaining of waxing and waning rectal bleeding with 8/10 burning pain with onset yesterday around 11 AM. Pt was seen in ED on 09/06/14 and was d/c with dx of external hemorrhoid. She reports it has ruptured since. Pt has tried soaking in baths for relief. Pt states she has been compliant with discharge instructions in regards to changing her diet. She states she has not gotten her topical cream yet. She has not tried anything OTC. Pt denies any other medical problems or complaints.   Past Medical History  Diagnosis Date  . ANXIETY 08/30/2007  . GRAVES' DISEASE 12/07/2008  . 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 INSOMNIA 08/30/2007  . MIGRAINE HEADACHE 08/30/2007  . Depression    Past Surgical History  Procedure Laterality Date  . Tubal ligation  2003   Family History  Problem Relation Age of Onset  . Thyroid disease Neg Hx   . Hypertension Mother    History  Substance Use Topics  . Smoking status: Current Every Day Smoker    Last Attempt to Quit: 06/29/2012  . Smokeless tobacco: Not on file  . Alcohol Use: 0.0 oz/week    0 Standard drinks or equivalent per week   OB History    No data available     Review of Systems  Gastrointestinal: Positive for hematochezia and anal bleeding. Negative for abdominal pain.  Neurological: Negative for dizziness.  All other systems reviewed and are negative.     Allergies  Adhesive; Celexa; Latex; and Phenergan  Home Medications   Prior to Admission  medications   Medication Sig Start Date End Date Taking? Authorizing Provider  ALPRAZolam Prudy Feeler) 0.5 MG tablet Take 1 tablet (0.5 mg total) by mouth at bedtime as needed for sleep. Patient taking differently: Take 0.25-0.5 mg by mouth at bedtime as needed for sleep (Restless legs).  05/22/14  Yes Campbell Riches, NP  hydrocortisone (ANUSOL-HC) 25 MG suppository Place 1 suppository (25 mg total) rectally 2 (two) times daily. For 7 days 09/06/14  Yes Mancel Bale, MD  levothyroxine (SYNTHROID, LEVOTHROID) 150 MCG tablet Take 1 tablet by mouth once daily before breakfast. APPOINTMENT NEEDED FOR FURTHER REFILLS 08/03/14  Yes Romero Belling, MD  lisinopril (PRINIVIL,ZESTRIL) 5 MG tablet Take 1 tablet (5 mg total) by mouth daily. 05/22/14  Yes Campbell Riches, NP  oxyCODONE-acetaminophen (PERCOCET) 5-325 MG per tablet Take 1 tablet by mouth every 4 (four) hours as needed for severe pain. 09/06/14  Yes Mancel Bale, MD  hydrocortisone (ANUSOL-HC) 2.5 % rectal cream Place rectally 3 (three) times daily. 09/08/14   Teresa Baton, MD  lidocaine (XYLOCAINE) 2 % jelly Apply 1 application topically 2 (two) times daily. 09/08/14   Teresa Baton, MD  meclizine (ANTIVERT) 25 MG tablet Take one every 6 hours for dizziness Patient taking differently: Take 25 mg by mouth every 6 (six) hours as needed for dizziness.  06/18/14   Bethann Berkshire, MD   BP 130/84 mmHg  Pulse 78  Temp(Src) 99.2 F (37.3 C) (Oral)  Resp 18  Ht 5\' 7"  (1.702 m)  Wt 200 lb (90.719 kg)  BMI 31.32 kg/m2  SpO2 97%  LMP 09/01/2014 Physical Exam  Constitutional: She is oriented to person, place, and time. She appears well-developed and well-nourished.  HENT:  Head: Normocephalic and atraumatic.  Cardiovascular: Normal rate and regular rhythm.   Pulmonary/Chest: Effort normal. No respiratory distress.  Genitourinary: Rectal exam shows external hemorrhoid.    1/2 cm thrombosed hemorrhoid at 6:00, no active bleeding noted   Neurological: She is alert and oriented to person, place, and time.  Skin: Skin is warm and dry.  Psychiatric: She has a normal mood and affect.  Nursing note and vitals reviewed.   ED Course  Procedures (including critical care time) DIAGNOSTIC STUDIES: Oxygen Saturation is 99% on room air, normal by my interpretation.    COORDINATION OF CARE: 11:07 PM Discussed treatment plan with patient at beside, the patient agrees with the plan and has no further questions at this time.   Labs Review Labs Reviewed  BASIC METABOLIC PANEL - Abnormal; Notable for the following:    Glucose, Bld 100 (*)    Creatinine, Ser 1.07 (*)    Calcium 8.6 (*)    All other components within normal limits  CBC WITH DIFFERENTIAL/PLATELET    Imaging Review No results found.   EKG Interpretation None      MDM   Final diagnoses:  Thrombosed external hemorrhoid   Patient presents with rectal bleeding. Recent diagnosis of external hemorrhoid. Has been unable to obtain her Anusol suppositories or prescription ointment. Thrombosed hemorrhoid noted on exam without active bleeding.  Discussed with patient continued supportive measures at home including continuing sitz baths. Lidocaine jelly and hydrocortisone cream were ordered for the patient to take home with her. She was also given general surgery follow-up if she requires banding or has persistent bleeding.  After history, exam, and medical workup I feel the patient has been appropriately medically screened and is safe for discharge home. Pertinent diagnoses were discussed with the patient. Patient was given return precautions.  I personally performed the services described in this documentation, which was scribed in my presence. The recorded information has been reviewed and is accurate.     Teresa Baton, MD 09/09/14 [phone]

## 2014-09-09 MED ORDER — HYDROCORTISONE 2.5 % RE CREA
TOPICAL_CREAM | RECTAL | Status: AC
  Filled 2014-09-09: qty 28.35

## 2014-09-14 ENCOUNTER — Telehealth: Payer: Self-pay | Admitting: Endocrinology

## 2014-09-14 ENCOUNTER — Ambulatory Visit: Payer: Self-pay | Admitting: Endocrinology

## 2014-09-14 MED ORDER — LEVOTHYROXINE SODIUM 150 MCG PO TABS: ORAL_TABLET | ORAL | Status: DC

## 2014-09-14 NOTE — Telephone Encounter (Signed)
Rx for Levothyroxine has been sent for 2 months.

## 2014-09-14 NOTE — Telephone Encounter (Signed)
Patient called and would like a refill on her medication She needed to reschedule due to a family emergency  Patient is aware she has cancelled a few times  Please advise    Thank you

## 2014-09-14 NOTE — Telephone Encounter (Signed)
Please refill x 2 mos Last refill before ov

## 2014-09-19 ENCOUNTER — Encounter (HOSPITAL_COMMUNITY): Payer: Self-pay | Admitting: Emergency Medicine

## 2014-09-19 ENCOUNTER — Emergency Department (HOSPITAL_COMMUNITY)
Admission: EM | Admit: 2014-09-19 | Discharge: 2014-09-19 | Disposition: A | Discharge status: Home / Self Care | Payer: Self-pay | Attending: Emergency Medicine | Admitting: Emergency Medicine

## 2014-09-19 DIAGNOSIS — F419 Anxiety disorder, unspecified: Secondary | ICD-10-CM | POA: Insufficient documentation

## 2014-09-19 DIAGNOSIS — G43909 Migraine, unspecified, not intractable, without status migrainosus: Secondary | ICD-10-CM | POA: Insufficient documentation

## 2014-09-19 DIAGNOSIS — Z7952 Long term (current) use of systemic steroids: Secondary | ICD-10-CM | POA: Insufficient documentation

## 2014-09-19 DIAGNOSIS — E890 Postprocedural hypothyroidism: Secondary | ICD-10-CM | POA: Insufficient documentation

## 2014-09-19 DIAGNOSIS — Z8669 Personal history of other diseases of the nervous system and sense organs: Secondary | ICD-10-CM | POA: Insufficient documentation

## 2014-09-19 DIAGNOSIS — Z720 Tobacco use: Secondary | ICD-10-CM | POA: Insufficient documentation

## 2014-09-19 DIAGNOSIS — Z91040 Latex allergy status: Secondary | ICD-10-CM | POA: Insufficient documentation

## 2014-09-19 DIAGNOSIS — L03114 Cellulitis of left upper limb: Secondary | ICD-10-CM | POA: Insufficient documentation

## 2014-09-19 DIAGNOSIS — Z79899 Other long term (current) drug therapy: Secondary | ICD-10-CM | POA: Insufficient documentation

## 2014-09-19 MED ORDER — DOXYCYCLINE HYCLATE 100 MG PO TABS: 100.0000 mg | ORAL_TABLET | Freq: Two times a day (BID) | ORAL | Status: DC

## 2014-09-19 NOTE — ED Notes (Signed)
Pt left without receiving discharge instructions. Phoned pt to let her know that her prescription was here for her to pick up. Left msg on pts cell phone.

## 2014-09-19 NOTE — ED Notes (Addendum)
Pt reports woke up this am with left forearm pain. Small bruise noted to left forearm. Distal pulses strong. Cap refill wnl. Pt denies any known injury.pt reports "pulsating pain in left forearm."

## 2014-09-19 NOTE — ED Provider Notes (Signed)
CSN: [id number]     Arrival date & time 09/19/14  1426 History   First MD Initiated Contact with Patient 09/19/14 1510     Chief Complaint  Patient presents with  . Arm Pain      HPI Pt was seen at 1515. Per pt, c/o unknown onset and persistence of constant left forearm "rash" that she noticed when she woke up this morning. Pt states "it hurts" so she came to the ED for evaluation. Denies injury, no focal motor weakness, no tingling/numbness in extremities, no fever.    Past Medical History  Diagnosis Date  . ANXIETY 08/30/2007  . GRAVES' DISEASE 12/07/2008  . 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 INSOMNIA 08/30/2007  . MIGRAINE HEADACHE 08/30/2007  . Depression    Past Surgical History  Procedure Laterality Date  . Tubal ligation  2003   Family History  Problem Relation Age of Onset  . Thyroid disease Neg Hx   . Hypertension Mother    History  Substance Use Topics  . Smoking status: Current Every Day Smoker    Last Attempt to Quit: 06/29/2012  . Smokeless tobacco: Not on file  . Alcohol Use: 0.0 oz/week    0 Standard drinks or equivalent per week    Review of Systems ROS: Statement: All systems negative except as marked or noted in the HPI; Constitutional: Negative for fever and chills. ; ; Eyes: Negative for eye pain, redness and discharge. ; ; ENMT: Negative for ear pain, hoarseness, nasal congestion, sinus pressure and sore throat. ; ; Cardiovascular: Negative for chest pain, palpitations, diaphoresis, dyspnea and peripheral edema. ; ; Respiratory: Negative for cough, wheezing and stridor. ; ; Gastrointestinal: Negative for nausea, vomiting, diarrhea, abdominal pain, blood in stool, hematemesis, jaundice and rectal bleeding. . ; ; Genitourinary: Negative for dysuria, flank pain and hematuria. ; ; Musculoskeletal: Negative for back pain and neck pain. Negative for swelling and trauma.; ; Skin: +rash. Negative for pruritus, abrasions, blisters, and skin lesion.; ; Neuro:  Negative for headache, lightheadedness and neck stiffness. Negative for weakness, altered level of consciousness , altered mental status, extremity weakness, paresthesias, involuntary movement, seizure and syncope.      Allergies  Adhesive; Celexa; Latex; and Phenergan  Home Medications   Prior to Admission medications   Medication Sig Start Date End Date Taking? Authorizing Provider  ALPRAZolam Prudy Feeler(XANAX) 0.5 MG tablet Take 1 tablet (0.5 mg total) by mouth at bedtime as needed for sleep. Patient taking differently: Take 0.25-0.5 mg by mouth at bedtime as needed for sleep (Restless legs).  05/22/14   Campbell Richesarolyn C Hoskins, NP  hydrocortisone (ANUSOL-HC) 2.5 % rectal cream Place rectally 3 (three) times daily. 09/08/14   Shon Batonourtney F Horton, MD  hydrocortisone (ANUSOL-HC) 25 MG suppository Place 1 suppository (25 mg total) rectally 2 (two) times daily. For 7 days 09/06/14   Mancel BaleElliott Wentz, MD  levothyroxine (SYNTHROID, LEVOTHROID) 150 MCG tablet Take 1 tablet by mouth once daily before breakfast. APPOINTMENT NEEDED FOR FURTHER REFILLS 09/14/14   Romero BellingSean Ellison, MD  lidocaine (XYLOCAINE) 2 % jelly Apply 1 application topically 2 (two) times daily. 09/08/14   Shon Batonourtney F Horton, MD  lisinopril (PRINIVIL,ZESTRIL) 5 MG tablet Take 1 tablet (5 mg total) by mouth daily. 05/22/14   Campbell Richesarolyn C Hoskins, NP  meclizine (ANTIVERT) 25 MG tablet Take one every 6 hours for dizziness Patient taking differently: Take 25 mg by mouth every 6 (six) hours as needed for dizziness.  06/18/14   Bethann BerkshireJoseph Zammit, MD  oxyCODONE-acetaminophen [facility](PERCOCET)  5-325 MG per tablet Take 1 tablet by mouth every 4 (four) hours as needed for severe pain. 09/06/14   Mancel Bale, MD   BP 172/93 mmHg  Pulse 69  Temp(Src) 98.1 F (36.7 C) (Oral)  Resp 18  Ht  (1.702 m)  Wt 200 lb (90.719 kg)  BMI 31.32 kg/m2  SpO2 99%  LMP 09/01/2014 Physical Exam  1520: Physical examination:  Nursing notes reviewed; Vital signs and O2 SAT reviewed;   Constitutional: Well developed, Well nourished, Well hydrated, In no acute distress; Head:  Normocephalic, atraumatic; Eyes: EOMI, PERRL, No scleral icterus; ENMT: Mouth and pharynx normal, Mucous membranes moist; Neck: Supple, Full range of motion, No lymphadenopathy; Cardiovascular: Regular rate and rhythm, No murmur, rub, or gallop; Respiratory: Breath sounds clear & equal bilaterally, No rales, rhonchi, wheezes.  Speaking full sentences with ease, Normal respiratory effort/excursion; Chest: Nontender, Movement normal; Abdomen: Soft, Nontender, Nondistended, Normal bowel sounds;; Extremities: Pulses normal, +left mid-volar forearm with approximately 2cm diameter area of superficial mild erythema, warmth, and induration, no ecchymosis, no fluctuance, no open wounds, no central pointing area. No deformity. Muscles compartments soft, strong radial pulse, NMS intact right hand, brisk cap refill in fingertips. No edema.; Neuro: AA&Ox3, Major CN grossly intact.  Speech clear. No gross focal motor or sensory deficits in extremities.; Skin: Color normal, Warm, Dry.   ED Course  Procedures     EKG Interpretation None      MDM  MDM Reviewed: previous chart, nursing note and vitals      1525:  Small area of mild induration left volar forearm. No abscess. No obvious open wounds. Tx symptomatically at this time. Dx d/w pt and family.  Questions answered.  Verb understanding, agreeable to d/c home with outpt f/u.   Samuel Jester, DO 09/23/14 1034

## 2014-09-19 NOTE — Discharge Instructions (Signed)
°Emergency Department Resource Guide °1) Find a Doctor and Pay Out of Pocket °Although you won't have to find out who is covered by your insurance plan, it is a good idea to ask around and get recommendations. You will then need to call the office and see if the doctor you have chosen will accept you as a new patient and what types of options they offer for patients who are self-pay. Some doctors offer discounts or will set up payment plans for their patients who do not have insurance, but you will need to ask so you aren't surprised when you get to your appointment. ° °2) Contact Your Local Health Department °Not all health departments have doctors that can see patients for sick visits, but many do, so it is worth a call to see if yours does. If you don't know where your local health department is, you can check in your phone book. The CDC also has a tool to help you locate your state's health department, and many state websites also have listings of all of their local health departments. ° °3) Find a Walk-in Clinic °If your illness is not likely to be very severe or complicated, you may want to try a walk in clinic. These are popping up all over the country in pharmacies, drugstores, and shopping centers. They're usually staffed by nurse practitioners or physician assistants that have been trained to treat common illnesses and complaints. They're usually fairly quick and inexpensive. However, if you have serious medical issues or chronic medical problems, these are probably not your best option. ° °No Primary Care Doctor: °- Call [facility] at  832-8000 - they can help you locate a primary care doctor that  accepts your insurance, provides certain services, etc. °- Physician Referral Service- 1-800-533-3463 ° °Chronic Pain Problems: °Organization         Address  Phone   Notes  ° [facility]  (336) 297-2271 Patients need to be referred by their primary care doctor.  ° °Medication  Assistance: °Organization         Address  Phone   Notes  °[place] [facility] [address] °[place], [place] [address] (336) 641-8030 --Must be a resident of [place] °-- Must have NO insurance coverage whatsoever (no Medicaid/ Medicare, etc.) °-- The pt. MUST have a primary care doctor that directs their care regularly and follows them in the community °  °[facility]  (866) 331-1348   °[employer]  (888) 892-1162   ° °Agencies that provide inexpensive medical care: °Organization         Address  Phone   Notes  °[facility]  (336) 832-8035   °[facility]    (336) 832-7272   °[facility] [address] °[place], [place] [address] (336) 832-4777   °[facility] [address], °[place] (336) 271-4999   °[facility]    (336) 373-0678   °[facility]    (336) 272-1050   °[facility] ° [address], [place] Phone:  (336) 832-4444, Fax:  (336) 832-4440 Hours of Operation:  9 am - 6 pm, M-F.  Also accepts Medicaid/Medicare and self-pay.  °[facility] ° [address], [place] Phone: (336) 832-3150, Fax: (336) 832-3151. Hours of Operation:  8:30 am - 5:30 pm, M-F.  Also accepts Medicaid and self-pay.  °[facility] [place] [address], [place] Phone: (336) 878-6027   °[facility] [address], [place], [place] (336)723-1848, Ext. 123 Mondays & Thursdays: 7-9 AM.  First 15 patients are seen on a first come, first serve basis. °  ° °Medicaid-accepting [place] Providers: ° °Organization         Address  Phone   Notes  °[facility] [address], [place] (336) 641-2100 Also accepts self-pay patients.  °[facility] [address], [place] ° (336) 856-9996   °[facility] [address], [place]  (336) 288-8857   °[facility] [address] [place] [address], [place] (336) 299-7000   °Veita Bland [address], [place]  ° (336) 373-1557 Only accepts [place] Medicaid patients after they have their name applied to their card.  ° °Self-Pay (no insurance) in [place]: ° °Organization         Address  Phone   Notes  °Sickle Cell Patients, [facility] [address], [place] (336) 832-1970   °[facility] [address], [place] (336) 832-4400   °[facility] ° [address], [place] (336) 992-4800   °[facility]/Dr. Osei-Bonsu ° [address] [place] [address], [place] or [address], [place] (336) [phone] Phone number for both [place] and [place] locations is the same.  °[facility] [address], [place] (336) 299-0000   °[facility] [address] [place] [address], [place] or [address] (336) 852-7530 °(336) 878-2260   °[facility] [address], [place] (336) 350-1642, phone; (336) 294-5005, fax Sees patients 1st and 3rd Saturday of every month.  Must not qualify for public or private insurance (i.e. Medicaid, Medicare, [place] Health Choice, Veterans' Benefits) • Household income should be no more than 200% of the poverty level •The clinic cannot treat you if you are pregnant or think you are pregnant • Sexually transmitted diseases are not treated at the clinic.  ° ° °Dental Care: °Organization         Address  Phone  Notes  °[place] [facility] [address], [place] (336) 641-6152 Accepts children up to age 21 who are enrolled in Medicaid or [place] Health Choice; pregnant women with a Medicaid card; and children who have applied for Medicaid or [place] Health Choice, but were declined, whose parents can pay a reduced fee at time of service.  °[place]  [facility] [place]  [address], [place] (336) 641-7733 Accepts children up to age 21 who are enrolled in Medicaid or [place] Health Choice; pregnant women with a Medicaid card; and children who have applied for Medicaid or [place] Health Choice, but were declined, whose parents can pay a reduced fee at time of service.  °[facility] ° [address], [place] (336) 641-4533 Patients are seen by appointment only. Walk-ins are not accepted. [facility] will see patients 18 years of age and older. °Monday - Tuesday (8am-5pm) °Most Wednesdays (8:30-5pm) °$30 per visit, cash only  °[facility] ° [address], [place] (336) 641-4533 Patients are seen by appointment only. Walk-ins are not accepted. [facility] will see patients 18 years of age and older. °One   Wednesday Evening (Monthly: Volunteer Based).  $30 per visit, cash only  °[facility]  (919) 537-3737 for adults; Children under age 4, call Graduate Pediatric Dentistry at ([phone]) [phone]. Children aged 4-14, please call (919) [phone] to request a pediatric application. ° Dental services are provided in all areas of dental care including fillings, crowns and bridges, complete and partial dentures, implants, gum treatment, root canals, and extractions. Preventive care is also provided. Treatment is provided to both adults and children. °Patients are selected via a lottery and there is often a waiting list. °  °[facility] [address], °[place] ° (336) 763-8833 [email or web address] °  °[facility] [address], [place], [place] (336)723-1848, Ext. 123 Second and Fourth Thursday of each month, opens at 6:30 AM; Clinic ends at 9 AM.  Patients are seen on a first-come first-served basis, and a limited number are seen during each clinic.  ° °[facility] ° [address], [place], [place] (336) 723-7904    Eligibility Requirements °You must have lived in [place], [place], or [place] counties for at least the last three months. °  You cannot be eligible for state or federal sponsored healthcare insurance, including Veterans Administration, Medicaid, or Medicare. °  You generally cannot be eligible for healthcare insurance through your employer.  °  How to apply: °Eligibility screenings are held every Tuesday and Wednesday afternoon from 1:00 pm until 4:00 pm. You do not need an appointment for the interview!  °[facility] [address], [place], [place] 336-631-2330   °[facility] [facility]  336-342-8273   °[facility]  336-703-3100   °[facility]  336-570-6415   ° °Behavioral Health Resources in the Community: °Intensive Outpatient Programs °Organization         Address  Phone  Notes  °[facility] [address], [place], [place] 336-878-6098   °[facility] [address], [place], [place] 336-832-9800   °[facility] [address], [place], [place] ° 336-882-2125   °[facility] [address],  °[place], [place] 1-800-853-5163 or 336-641-4981   °Substance Abuse Resources °Organization         Address  Phone  Notes  °[facility]  336-882-2125   °[facility]  336-784-9470   °[facility]  336-285-9073   °[facility]  336-845-3988   °Residential & Outpatient Substance Abuse Program  1-800-659-3381   °Psychological Services °Organization         Address  Phone  Notes  °[facility]  336- 832-9600   °[facility]  336- 378-7881   °[facility] [address], [place] 1-800-853-5163 or 336-641-4981   ° °Mobile Crisis Teams °Organization         Address  Phone  Notes  °[facility], Mobile Crisis Care Unit  1-877-626-1772   °Assertive °[facility] ° [address]  [place], [place] 336-834-9664   °Sharon DeEsch [address] °[place] 336-554-5454   ° °Self-Help/Support Groups °Organization         Address  Phone             Notes  °[facility]  - variety of support groups  336- 373-1402 Call for more information  °Narcotics Anonymous (NA), [facility] [address], °[place]   2 meetings at this location  ° °  Residential Treatment Programs °Organization         Address  Phone  Notes  °[facility] [address],    °[place]  1-866-801-8205   °[facility] ° [address], [place], [place] 704-293-8524   °[facility] [address], [place] 336-845-3988 Admissions: 8am-3pm M-F  °[facility] [address]    °[place], [place] 336-841-1104   °[facility] [address], [place], [place] 336-379-7146   °[facility] [address]  °[place], [place] 336-285-9073   °[facility] Intensive Outpatient [address], [place], [place] 336-852-3033   °[facility] [address]  °[place], [place] 1-877-615-2722 or 336-784-9470   °[facility] [address] [place], [place] 336-227-7417 Accepts Medicaid  °[facility] [address]  °[place] 1-800-659-3381 Substance Abuse/Addiction Treatment  ° °[place] Behavioral Health Resources °Organization         Address  Phone  Notes  °[facility]  (888) 581-9988   °Julie Brannon, PhD [address] [place], [place]   (336) 349-5553 or (336) 951-0000   °[facility]   [address] °[place], [place] (336) 349-4454   °[facility] [address], [place], [place] (336) 342-8316 Insurance/Medicaid/sponsorship through [facility]  °[facility] [address]                                    [place], [place] (336) 342-8316 Therapy/tele-psych/case    °[facility] [address]  ° [place], [place] (336) 349-2233    °Dr. Arfeen  (336) 349-4544   °[facility] [place]  [employer] [place] [facility] 1) [address],  °2) [address], [place] °3)  [address], [place] (336) 349-3220 °(336) 342-7768 ° °(336) 342-8140   °[place] Child Abuse Hotline (336) 342-1394 or (336) 342-3537 (After Hours)    ° ° °Take the prescription as directed.  Apply moist heat or ice to the area(s) of discomfort, for 15 minutes at a time, several times per day for the next few days.  Do not fall asleep on a heating or ice pack.  Call your regular medical doctor on Monday to schedule a follow up appointment in the next 2 days.  Return to the Emergency Department immediately if worsening. ° °

## 2014-10-05 ENCOUNTER — Ambulatory Visit: Payer: Self-pay | Admitting: Endocrinology

## 2014-10-30 ENCOUNTER — Emergency Department (HOSPITAL_COMMUNITY): Payer: Medicaid Other

## 2014-10-30 ENCOUNTER — Emergency Department (HOSPITAL_COMMUNITY)
Admission: EM | Admit: 2014-10-30 | Discharge: 2014-10-30 | Disposition: A | Discharge status: Home / Self Care | Payer: Medicaid Other | Attending: Emergency Medicine | Admitting: Emergency Medicine

## 2014-10-30 ENCOUNTER — Encounter (HOSPITAL_COMMUNITY): Payer: Self-pay | Admitting: Emergency Medicine

## 2014-10-30 DIAGNOSIS — Z8679 Personal history of other diseases of the circulatory system: Secondary | ICD-10-CM | POA: Diagnosis not present

## 2014-10-30 DIAGNOSIS — Y9389 Activity, other specified: Secondary | ICD-10-CM | POA: Insufficient documentation

## 2014-10-30 DIAGNOSIS — S93602A Unspecified sprain of left foot, initial encounter: Secondary | ICD-10-CM

## 2014-10-30 DIAGNOSIS — Z8669 Personal history of other diseases of the nervous system and sense organs: Secondary | ICD-10-CM | POA: Diagnosis not present

## 2014-10-30 DIAGNOSIS — Z91040 Latex allergy status: Secondary | ICD-10-CM | POA: Diagnosis not present

## 2014-10-30 DIAGNOSIS — Z79899 Other long term (current) drug therapy: Secondary | ICD-10-CM | POA: Diagnosis not present

## 2014-10-30 DIAGNOSIS — Z7952 Long term (current) use of systemic steroids: Secondary | ICD-10-CM | POA: Insufficient documentation

## 2014-10-30 DIAGNOSIS — Z792 Long term (current) use of antibiotics: Secondary | ICD-10-CM | POA: Diagnosis not present

## 2014-10-30 DIAGNOSIS — S99912A Unspecified injury of left ankle, initial encounter: Secondary | ICD-10-CM | POA: Diagnosis present

## 2014-10-30 DIAGNOSIS — Z720 Tobacco use: Secondary | ICD-10-CM | POA: Insufficient documentation

## 2014-10-30 DIAGNOSIS — Y9289 Other specified places as the place of occurrence of the external cause: Secondary | ICD-10-CM | POA: Diagnosis not present

## 2014-10-30 DIAGNOSIS — F419 Anxiety disorder, unspecified: Secondary | ICD-10-CM | POA: Insufficient documentation

## 2014-10-30 DIAGNOSIS — W231XXA Caught, crushed, jammed, or pinched between stationary objects, initial encounter: Secondary | ICD-10-CM | POA: Insufficient documentation

## 2014-10-30 DIAGNOSIS — E039 Hypothyroidism, unspecified: Secondary | ICD-10-CM | POA: Diagnosis not present

## 2014-10-30 DIAGNOSIS — Y998 Other external cause status: Secondary | ICD-10-CM | POA: Insufficient documentation

## 2014-10-30 MED ORDER — OXYCODONE-ACETAMINOPHEN 5-325 MG PO TABS: 0.5000 | ORAL_TABLET | ORAL | Status: DC | PRN

## 2014-10-30 NOTE — Discharge Instructions (Signed)
Foot Sprain The muscles and cord like structures which attach muscle to bone (tendons) that surround the feet are made up of units. A foot sprain can occur at the weakest spot in any of these units. This condition is most often caused by injury to or overuse of the foot, as from playing contact sports, or aggravating a previous injury, or from poor conditioning, or obesity. SYMPTOMS  Pain with movement of the foot.  Tenderness and swelling at the injury site.  Loss of strength is present in moderate or severe sprains. THE THREE GRADES OR SEVERITY OF FOOT SPRAIN ARE:  Mild (Grade I): Slightly pulled muscle without tearing of muscle or tendon fibers or loss of strength.  Moderate (Grade II): Tearing of fibers in a muscle, tendon, or at the attachment to bone, with small decrease in strength.  Severe (Grade III): Rupture of the muscle-tendon-bone attachment, with separation of fibers. Severe sprain requires surgical repair. Often repeating (chronic) sprains are caused by overuse. Sudden (acute) sprains are caused by direct injury or over-use. DIAGNOSIS  Diagnosis of this condition is usually by your own observation. If problems continue, a caregiver may be required for further evaluation and treatment. X-rays may be required to make sure there are not breaks in the bones (fractures) present. Continued problems may require physical therapy for treatment. PREVENTION  Use strength and conditioning exercises appropriate for your sport.  Warm up properly prior to working out.  Use athletic shoes that are made for the sport you are participating in.  Allow adequate time for healing. Early return to activities makes repeat injury more likely, and can lead to an unstable arthritic foot that can result in prolonged disability. Mild sprains generally heal in 3 to 10 days, with moderate and severe sprains taking 2 to 10 weeks. Your caregiver can help you determine the proper time required for  healing. HOME CARE INSTRUCTIONS   Apply ice to the injury for 15-20 minutes, 03-04 times per day. Put the ice in a plastic bag and place a towel between the bag of ice and your skin.  An elastic wrap (like an Ace bandage) may be used to keep swelling down.  Keep foot above the level of the heart, or at least raised on a footstool, when swelling and pain are present.  Try to avoid use other than gentle range of motion while the foot is painful. Do not resume use until instructed by your caregiver. Then begin use gradually, not increasing use to the point of pain. If pain does develop, decrease use and continue the above measures, gradually increasing activities that do not cause discomfort, until you gradually achieve normal use.  Use crutches if and as instructed, and for the length of time instructed.  Keep injured foot and ankle wrapped between treatments.  Massage foot and ankle for comfort and to keep swelling down. Massage from the toes up towards the knee.  Only take over-the-counter or prescription medicines for pain, discomfort, or fever as directed by your caregiver. SEEK IMMEDIATE MEDICAL CARE IF:   Your pain and swelling increase, or pain is not controlled with medications.  You have loss of feeling in your foot or your foot turns cold or blue.  You develop new, unexplained symptoms, or an increase of the symptoms that brought you to your caregiver. MAKE SURE YOU:   Understand these instructions.  Will watch your condition.  Will get help right away if you are not doing well or get worse. Document Released:  08/26/2001 Document Revised: 05/29/2011 Document Reviewed: 10/24/2007 ExitCare Patient Information 2015 [place], [place]. This information is not intended to replace advice given to you by your health care provider. Make sure you discuss any questions you have with your health care provider.   Wear the shoe provided and use crutches to avoid weight bearing.  Use ice  and elevation as much as possible for the next several days to help reduce the swelling.  Take the medications prescribed.  You may take the oxycodone prescribed for pain relief.  This will make you drowsy - do not drive within 4 hours of taking this medication.  Use your home ibuprofen (motrin) for inflammation, 600 mg three times daily.  Call your doctor for a recheck if not improving as discussed.

## 2014-10-30 NOTE — ED Provider Notes (Signed)
CSN: [id number]     Arrival date & time 10/30/14  1504 History   First MD Initiated Contact with Patient 10/30/14 1544     Chief Complaint  Patient presents with  . Ankle Pain     (Consider location/radiation/quality/duration/timing/severity/associated sxs/prior Treatment) The history is provided by the patient.    Teresa Vincent is a 34 y.o. female presenting with left foot pain which occurred suddenly when the patient tripped going down steps, causing her foot to get caught between 2 steps, in twisting fashion.  Pain is aching, constant and worse with palpation, movement and weight bearing.  The patient was able to weight bear immediately after the event.  There is no radiation of pain and the patient denies numbness distal to the injury site.  She has had no treatment prior to arrival.     Past Medical History  Diagnosis Date  . ANXIETY 08/30/2007  . GRAVES' DISEASE 12/07/2008  . 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 INSOMNIA 08/30/2007  . MIGRAINE HEADACHE 08/30/2007   Past Surgical History  Procedure Laterality Date  . Tubal ligation  2003   Family History  Problem Relation Age of Onset  . Thyroid disease Neg Hx   . Hypertension Mother    Social History  Substance Use Topics  . Smoking status: Current Every Day Smoker -- 1.00 packs/day for 26 years    Types: Cigarettes    Last Attempt to Quit: 06/29/2012  . Smokeless tobacco: Never Used  . Alcohol Use: 0.0 oz/week    0 Standard drinks or equivalent per week     Comment: occasional   OB History    Gravida Para Term Preterm AB TAB SAB Ectopic Multiple Living   4 1 1  3     1      Review of Systems  Constitutional: Negative for fever.  Musculoskeletal: Positive for joint swelling and arthralgias. Negative for myalgias.  Neurological: Negative for weakness and numbness.      Allergies  Adhesive; Celexa; Latex; and Phenergan  Home Medications   Prior to Admission medications   Medication Sig  Start Date End Date Taking? Authorizing Provider  ALPRAZolam Prudy Feeler) 0.5 MG tablet Take 1 tablet (0.5 mg total) by mouth at bedtime as needed for sleep. Patient taking differently: Take 0.25-0.5 mg by mouth at bedtime as needed for sleep (Restless legs).  05/22/14   Campbell Riches, NP  doxycycline (VIBRA-TABS) 100 MG tablet Take 1 tablet (100 mg total) by mouth 2 (two) times daily. 09/19/14   Samuel Jester, DO  hydrocortisone (ANUSOL-HC) 2.5 % rectal cream Place rectally 3 (three) times daily. 09/08/14   Shon Baton, MD  hydrocortisone (ANUSOL-HC) 25 MG suppository Place 1 suppository (25 mg total) rectally 2 (two) times daily. For 7 days 09/06/14   Mancel Bale, MD  levothyroxine (SYNTHROID, LEVOTHROID) 150 MCG tablet Take 1 tablet by mouth once daily before breakfast. APPOINTMENT NEEDED FOR FURTHER REFILLS 09/14/14   Romero Belling, MD  lidocaine (XYLOCAINE) 2 % jelly Apply 1 application topically 2 (two) times daily. Patient taking differently: Apply 1 application topically daily as needed.  09/08/14   Shon Baton, MD  lisinopril (PRINIVIL,ZESTRIL) 5 MG tablet Take 1 tablet (5 mg total) by mouth daily. 05/22/14   Campbell Riches, NP  meclizine (ANTIVERT) 25 MG tablet Take one every 6 hours for dizziness Patient taking differently: Take 25 mg by mouth every 6 (six) hours as needed for dizziness.  06/18/14   Bethann Berkshire, MD  oxyCODONE-acetaminophen (  PERCOCET/ROXICET) 5-325 MG per tablet Take 0.5-1 tablets by mouth every 4 (four) hours as needed for severe pain. 10/30/14   Burgess Amor, PA-C   BP 145/94 mmHg  Pulse 82  Temp(Src) 98.7 F (37.1 C) (Oral)  Resp 18  Ht  (1.702 m)  Wt 200 lb (90.719 kg)  BMI 31.32 kg/m2  SpO2 100%  LMP 09/18/2014 Physical Exam  Constitutional: She appears well-developed and well-nourished.  HENT:  Head: Normocephalic.  Cardiovascular: Normal rate and intact distal pulses.  Exam reveals no decreased pulses.   Pulses:      Dorsalis pedis pulses are  2+ on the right side, and 2+ on the left side.       Posterior tibial pulses are 2+ on the right side, and 2+ on the left side.  Musculoskeletal: She exhibits edema and tenderness.       Left foot: There is tenderness and swelling. There is normal capillary refill and no deformity.       Feet:  ttp mid dorsal left foot.  Mild edema and early bruising.  Dorsalis pedal pulse full.  Distal cap refill less than 2 seconds.  Ankle and lower leg nontender.  Neurological: She is alert. No sensory deficit.  Skin: Skin is warm, dry and intact.  Nursing note and vitals reviewed.   ED Course  Procedures (including critical care time) Labs Review Labs Reviewed - No data to display  Imaging Review Dg Foot Complete Left  10/30/2014   CLINICAL DATA:  Started to fall going down steps, caught herself but foot was caught between steps, having pain and swelling across top of foot, initial encounter  EXAM: LEFT FOOT - COMPLETE 3+ VIEW  COMPARISON:  None  FINDINGS: Mild dorsal soft tissue swelling overlying the metatarsals.  Osseous mineralization normal.  Joint spaces preserved.  No fracture, dislocation, or bone destruction.  IMPRESSION: No acute osseous abnormalities pre   Electronically Signed   By: Ulyses Southward M.D.   On: 10/30/2014 15:41   Patients labs and/or radiological studies were reviewed and considered during the medical decision making and disposition process. Imaging was reviewed, interpreted and I agree with radiologists reading. Results were also discussed with patient.    EKG Interpretation None      MDM   Final diagnoses:  Foot sprain, left, initial encounter    Pt placed in post op shoe, crutches provided. RICE, f/u with pcp if not improving over the next 10 days.    Burgess Amor, PA-C 10/30/14 1716  Mancel Bale, MD 10/31/14 [phone]

## 2014-10-30 NOTE — ED Notes (Signed)
Pt was walking down some steps when she fell down and got foot caught in between 2 steps-- Pain , swelling to Lt foot

## 2014-12-07 ENCOUNTER — Other Ambulatory Visit: Payer: Self-pay | Admitting: Endocrinology

## 2014-12-08 ENCOUNTER — Encounter: Payer: Self-pay | Admitting: Endocrinology

## 2014-12-08 NOTE — Telephone Encounter (Signed)
Please advise if ok to refill last office visit was 03/27/2013.

## 2014-12-10 ENCOUNTER — Telehealth: Payer: Self-pay | Admitting: Endocrinology

## 2014-12-10 NOTE — Telephone Encounter (Signed)
Patient dismissed from [facility] by Dr. Romero Belling on 12/08/14. Dismissal letter sent out by certified / registered mail.fbg  Certified dismissal letter returned as undeliverable, unclaimed, return to sender after three attempts by [employer]. Letter placed in another envelope and resent as 1st class mail which does not require a signature.01/12/15 fbg

## 2014-12-16 ENCOUNTER — Telehealth: Payer: Self-pay | Admitting: Family Medicine

## 2014-12-16 NOTE — Telephone Encounter (Signed)
oxyCODONE-acetaminophen (PERCOCET/ROXICET) 5-325 MG per tablet  Pt needs refill please

## 2014-12-16 NOTE — Telephone Encounter (Signed)
No, not really clear from last note why she is expecting is on it long term, ask pt why she takes it now, and let her know we cannot do over the phone, may not even do in person unless good reaon

## 2014-12-16 NOTE — Telephone Encounter (Signed)
Notified patient we cannot do over the phone, may not even do in person unless good reason. Patient states that she takes it for her migraines only and she verbalized understanding.

## 2014-12-16 NOTE — Telephone Encounter (Signed)
Patient last seen 3/16 for blood pressure check- last percocet from ED- 10/2014 #10 no recent percocet from our office

## 2014-12-25 ENCOUNTER — Emergency Department (HOSPITAL_COMMUNITY)
Admission: EM | Admit: 2014-12-25 | Discharge: 2014-12-25 | Disposition: A | Discharge status: Home / Self Care | Payer: Medicaid Other | Attending: Emergency Medicine | Admitting: Emergency Medicine

## 2014-12-25 ENCOUNTER — Encounter (HOSPITAL_COMMUNITY): Payer: Self-pay | Admitting: Emergency Medicine

## 2014-12-25 ENCOUNTER — Emergency Department (HOSPITAL_COMMUNITY): Payer: Medicaid Other

## 2014-12-25 DIAGNOSIS — Z720 Tobacco use: Secondary | ICD-10-CM | POA: Diagnosis not present

## 2014-12-25 DIAGNOSIS — Z8669 Personal history of other diseases of the nervous system and sense organs: Secondary | ICD-10-CM | POA: Insufficient documentation

## 2014-12-25 DIAGNOSIS — E890 Postprocedural hypothyroidism: Secondary | ICD-10-CM | POA: Insufficient documentation

## 2014-12-25 DIAGNOSIS — M79622 Pain in left upper arm: Secondary | ICD-10-CM | POA: Diagnosis not present

## 2014-12-25 DIAGNOSIS — M542 Cervicalgia: Secondary | ICD-10-CM | POA: Diagnosis present

## 2014-12-25 DIAGNOSIS — M5412 Radiculopathy, cervical region: Secondary | ICD-10-CM | POA: Diagnosis not present

## 2014-12-25 DIAGNOSIS — F419 Anxiety disorder, unspecified: Secondary | ICD-10-CM | POA: Insufficient documentation

## 2014-12-25 DIAGNOSIS — Z91040 Latex allergy status: Secondary | ICD-10-CM | POA: Insufficient documentation

## 2014-12-25 DIAGNOSIS — Z79899 Other long term (current) drug therapy: Secondary | ICD-10-CM | POA: Diagnosis not present

## 2014-12-25 MED ORDER — PREDNISONE 10 MG PO TABS: ORAL_TABLET | ORAL | Status: DC

## 2014-12-25 MED ORDER — MORPHINE SULFATE (PF) 2 MG/ML IV SOLN
4.0000 mg | Freq: Once | INTRAVENOUS | Status: AC
  Administered 2014-12-25: 4 mg via INTRAMUSCULAR
  Filled 2014-12-25: qty 2

## 2014-12-25 MED ORDER — METHOCARBAMOL 500 MG PO TABS: 500.0000 mg | ORAL_TABLET | Freq: Three times a day (TID) | ORAL | Status: DC

## 2014-12-25 MED ORDER — OXYCODONE-ACETAMINOPHEN 5-325 MG PO TABS: 1.0000 | ORAL_TABLET | ORAL | Status: DC | PRN

## 2014-12-25 MED ORDER — OXYCODONE-ACETAMINOPHEN 5-325 MG PO TABS
1.0000 | ORAL_TABLET | Freq: Once | ORAL | Status: AC
  Administered 2014-12-25: 1 via ORAL
  Filled 2014-12-25: qty 1

## 2014-12-25 MED ORDER — DIAZEPAM 5 MG PO TABS
5.0000 mg | ORAL_TABLET | Freq: Once | ORAL | Status: AC
  Administered 2014-12-25: 5 mg via ORAL
  Filled 2014-12-25: qty 1

## 2014-12-25 NOTE — ED Provider Notes (Signed)
CSN: [id number]     Arrival date & time 12/25/14  1237 History   First MD Initiated Contact with Patient 12/25/14 1252     Chief Complaint  Patient presents with  . Torticollis  . Shoulder Pain     (Consider location/radiation/quality/duration/timing/severity/associated sxs/prior Treatment) HPI   ELLIEANNA FUNDERBURG is a 34 y.o. female who presents to the Emergency Department complaining of left neck and upper arm pain for 4 days.  She states that she woke up with mild pain that has gradually worsened.  She describes a sharp pain and stiffness to her head that radiates into her left scalp and down the left arm.  Pain is worse with rotation of her neck.  She has tried OTC medications, ice heat and massage without relief.  She also admits to taking one half a percocet without relief.  She denies recent illness, fever, chills, numbness or weakness of the upper extremities, swelling, visual changes or headaches   Past Medical History  Diagnosis Date  . ANXIETY 08/30/2007  . GRAVES' DISEASE 12/07/2008  . 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 INSOMNIA 08/30/2007  . MIGRAINE HEADACHE 08/30/2007   Past Surgical History  Procedure Laterality Date  . Tubal ligation  2003   Family History  Problem Relation Age of Onset  . Thyroid disease Neg Hx   . Hypertension Mother    Social History  Substance Use Topics  . Smoking status: Current Every Day Smoker -- 1.00 packs/day for 26 years    Types: Cigarettes    Last Attempt to Quit: 06/29/2012  . Smokeless tobacco: Never Used  . Alcohol Use: 0.0 oz/week    0 Standard drinks or equivalent per week     Comment: occasional   OB History    Gravida Para Term Preterm AB TAB SAB Ectopic Multiple Living   Review of Systems  Constitutional: Negative for fever and chills.  HENT: Negative for dental problem and trouble swallowing.   Eyes: Negative for visual disturbance.  Respiratory: Negative for chest tightness.    Cardiovascular: Negative for chest pain.  Gastrointestinal: Negative for nausea and vomiting.  Genitourinary: Negative for flank pain.  Musculoskeletal: Positive for neck pain and neck stiffness. Negative for joint swelling.  Skin: Negative for color change and wound.  Neurological: Negative for dizziness, syncope, speech difficulty, weakness, numbness and headaches.  All other systems reviewed and are negative.     Allergies  Adhesive; Celexa; Latex; and Phenergan  Home Medications   Prior to Admission medications   Medication Sig Start Date End Date Taking? Authorizing Provider  ALPRAZolam Prudy Feeler) 0.5 MG tablet Take 1 tablet (0.5 mg total) by mouth at bedtime as needed for sleep. Patient taking differently: Take 0.25-0.5 mg by mouth at bedtime as needed for sleep (Restless legs).  05/22/14  Yes Campbell Riches, NP  levothyroxine (SYNTHROID, LEVOTHROID) 150 MCG tablet TAKE ONE TABLET BY MOUTH ONCE DAILY BEFORE BREAKFAST **APPOINTMENT  NEEDED  FOR  FURTHER  REFILLS** Patient taking differently: TAKE ONE TABLET BY MOUTH ONCE DAILY BEFORE BREAKFAST 12/08/14  Yes Romero Belling, MD  lisinopril (PRINIVIL,ZESTRIL) 5 MG tablet Take 1 tablet (5 mg total) by mouth daily. 05/22/14  Yes Campbell Riches, NP  meclizine (ANTIVERT) 25 MG tablet Take one every 6 hours for dizziness 06/18/14  Yes Bethann Berkshire, MD  methocarbamol (ROBAXIN) 500 MG tablet Take 1 tablet (500 mg total) by mouth 3 (three) times daily.  12/25/14   Bhakti Labella, PA-C  oxyCODONE-acetaminophen (PERCOCET/ROXICET) 5-325 MG tablet Take 1 tablet by mouth every 4 (four) hours as needed. 12/25/14   Fortino Haag, PA-C  predniSONE (DELTASONE) 10 MG tablet Take 6 tablets day one, 5 tablets day two, 4 tablets day three, 3 tablets day four, 2 tablets day five, then 1 tablet day six 12/25/14   Tannia Contino, PA-C   BP 135/83 mmHg  Pulse 66  Temp(Src) 98.2 F (36.8 C) (Oral)  Resp 14  Ht  (1.702 m)  Wt 212 lb (96.163 kg)  BMI 33.20  kg/m2  SpO2 98%  LMP    Physical Exam  Constitutional: She is oriented to person, place, and time. She appears well-developed and well-nourished. No distress.  HENT:  Head: Normocephalic and atraumatic.  Mouth/Throat: Oropharynx is clear and moist.  Eyes: EOM are normal. Pupils are equal, round, and reactive to light.  Neck: Phonation normal. Spinous process tenderness and muscular tenderness present. No rigidity. Decreased range of motion present. No erythema present. No Kernig's sign noted. No thyromegaly present.     Cardiovascular: Normal rate, regular rhythm, normal heart sounds and intact distal pulses.   No murmur heard. Pulmonary/Chest: Effort normal and breath sounds normal. No respiratory distress. She exhibits no tenderness.  Musculoskeletal: She exhibits tenderness. She exhibits no edema.       Cervical back: She exhibits tenderness, bony tenderness and spasm. She exhibits normal range of motion, no swelling, no deformity and normal pulse.  ttp of the left cervical spine and paraspinal muscles and along the left trapezius muscle.  Grip strength is strong and equal bilaterally.  Distal sensation intact,  CR < 2 sec.  No erythema, excessive warmth, bony deformity or edema  Lymphadenopathy:    She has no cervical adenopathy.  Neurological: She is alert and oriented to person, place, and time. She has normal strength. No sensory deficit. She exhibits normal muscle tone. Coordination normal.  Reflex Scores:      Tricep reflexes are 2+ on the right side and 2+ on the left side.      Bicep reflexes are 2+ on the right side and 2+ on the left side. Skin: Skin is warm and dry.  Nursing note and vitals reviewed.   ED Course  Procedures (including critical care time) Labs Review Labs Reviewed - No data to display  Imaging Review Dg Cervical Spine Complete  12/25/2014   CLINICAL DATA:  Left-sided neck pain for 4 days, no known injury, initial encounter  EXAM: CERVICAL SPINE  4+  VIEWS  COMPARISON:  None.  FINDINGS: Seven cervical segments are well visualized. Osteophytic changes are noted at C5-6 and C6-7. Mild neural foraminal narrowing is noted at these levels as well bilaterally. No acute fracture or acute facet abnormality is noted. No soft tissue changes are seen.  IMPRESSION: Degenerative change without acute abnormality.   Electronically Signed   By: Alcide Clever M.D.   On: 12/25/2014 14:20   I have personally reviewed and evaluated these images and lab results as part of my medical decision-making.   EKG Interpretation None      MDM   Final diagnoses:  Cervical radiculopathy    Pt is well appearing. Vitals stable.  Sx's appear c/w radiculopathy.  No concerning sx's for emergent neurological process or meningmus.  Pt appears stable for d/c and agrees to symptomatic tx and close PMD f/u.    Pt reviewed on the [place] narcotic database.  No rx's filed since  AugustPauline Aus, PA-C 12/25/14 1658  Pricilla Loveless, MD 12/29/14 [phone]

## 2014-12-25 NOTE — ED Notes (Signed)
Advised not to take any more medications for the next 4 hours

## 2014-12-25 NOTE — ED Notes (Signed)
Pt states that she remembers having pain in the morning 4 days ago with increasing pain each day. Pain is mostly to the neck, left shoulder area. Pain worsens with movement. Limited ability to turn head to the right or left but has more mobility with movement to to the right. Increased pain when tilting head back as well. Pt also sates pain radiating down left arm at times, towards the elbow. Pt has used ice, ibuprofen and exercises without relief.

## 2014-12-25 NOTE — ED Notes (Signed)
Having neck pain for last 4 days.  Pain has increased, rates pain 10/10.  Took 1/2 percocet at 0900 this am without relief.  Denies injury.

## 2014-12-25 NOTE — Discharge Instructions (Signed)
Cervical Radiculopathy Cervical radiculopathy means that a nerve in the neck is pinched or bruised. This can cause pain or loss of feeling (numbness) that runs from your neck to your arm and fingers. HOME CARE Managing Pain  Take over-the-counter and prescription medicines only as told by your doctor.  If directed, put ice on the injured or painful area.  Put ice in a plastic bag.  Place a towel between your skin and the bag.  Leave the ice on for 20 minutes, 2-3 times per day.  If ice does not help, you can try using heat. Take a warm shower or warm bath, or use a heat pack as told by your doctor.  You may try a gentle neck and shoulder massage. Activity  Rest as needed. Follow instructions from your doctor about any activities to avoid.  Do exercises as told by your doctor or physical therapist. General Instructions   If you were given a soft collar, wear it as told by your doctor.  Use a flat pillow when you sleep.  Keep all follow-up visits as told by your doctor. This is important. GET HELP IF:  Your condition does not improve with treatment. GET HELP RIGHT AWAY IF:   Your pain gets worse and is not controlled with medicine.  You lose feeling or feel weak in your hand, arm, face, or leg.  You have a fever.  You have a stiff neck.  You cannot control when you poop or pee (have incontinence).  You have trouble with walking, balance, or talking.   This information is not intended to replace advice given to you by your health care provider. Make sure you discuss any questions you have with your health care provider.   Document Released: 02/23/2011 Document Revised: 11/25/2014 Document Reviewed: 04/30/2014 Elsevier Interactive Patient Education 2016 [employer]vier Inc.  

## 2014-12-28 ENCOUNTER — Encounter: Payer: Self-pay | Admitting: Family Medicine

## 2014-12-28 ENCOUNTER — Telehealth: Payer: Self-pay | Admitting: Family Medicine

## 2014-12-28 DIAGNOSIS — E039 Hypothyroidism, unspecified: Secondary | ICD-10-CM

## 2014-12-28 NOTE — Telephone Encounter (Signed)
Pts Endocrinologist from [place] is stating he will no longer see her anymore She is unsure why he just wrote her a letter telling her he can no longer see her.    She would like to see Dr Fransico Him if possible, she will need a referral to their office. She  Will be out of her meds within the next 3 weeks.   Please advise, patient if very upset an worried at this point.

## 2014-12-28 NOTE — Telephone Encounter (Signed)
Notified patient referral has been initiated and keep all appointments.

## 2014-12-28 NOTE — Telephone Encounter (Signed)
Lets do, tell pt important to keep all visist s with new dr

## 2015-02-02 ENCOUNTER — Other Ambulatory Visit: Payer: Self-pay | Admitting: *Deleted

## 2015-02-02 ENCOUNTER — Telehealth: Payer: Self-pay | Admitting: Family Medicine

## 2015-02-02 MED ORDER — LEVOTHYROXINE SODIUM 150 MCG PO TABS: ORAL_TABLET | ORAL | Status: DC

## 2015-02-02 NOTE — Telephone Encounter (Signed)
One mo

## 2015-02-02 NOTE — Telephone Encounter (Signed)
Patient cannot see her new endocrinologist under the first of December.  She has been out of her levothyroxine (SYNTHROID, LEVOTHROID) 150 MCG tablet for a week.  She wants to know if we can send her in a refill to last until she sees her new endocrinologist.    [facility]

## 2015-02-02 NOTE — Telephone Encounter (Signed)
Med sent- pt notified

## 2015-02-05 ENCOUNTER — Telehealth: Payer: Self-pay | Admitting: Family Medicine

## 2015-02-05 NOTE — Telephone Encounter (Signed)
Pt is wanting to know if it is ok for her to use a nasal spray called healthy accent original nasal spray. Pt states that on the back of the nasal spray it says to ask a dr before use if you have a thyroid disease. Pt states that she has graves disease. Please advise.

## 2015-02-05 NOTE — Telephone Encounter (Signed)
[facility]

## 2015-02-05 NOTE — Telephone Encounter (Signed)
Discussed with pt. Pt verbalized understanding.  °

## 2015-02-05 NOTE — Telephone Encounter (Signed)
Sorry never heard of it, may need to ask thyroid doctor

## 2015-02-14 ENCOUNTER — Emergency Department (HOSPITAL_COMMUNITY)
Admission: EM | Admit: 2015-02-14 | Discharge: 2015-02-14 | Disposition: A | Discharge status: Home / Self Care | Payer: Medicaid Other | Attending: Emergency Medicine | Admitting: Emergency Medicine

## 2015-02-14 ENCOUNTER — Emergency Department (HOSPITAL_COMMUNITY): Payer: Medicaid Other

## 2015-02-14 ENCOUNTER — Encounter (HOSPITAL_COMMUNITY): Payer: Self-pay | Admitting: *Deleted

## 2015-02-14 DIAGNOSIS — Z923 Personal history of irradiation: Secondary | ICD-10-CM | POA: Diagnosis not present

## 2015-02-14 DIAGNOSIS — F419 Anxiety disorder, unspecified: Secondary | ICD-10-CM | POA: Diagnosis not present

## 2015-02-14 DIAGNOSIS — R05 Cough: Secondary | ICD-10-CM | POA: Diagnosis present

## 2015-02-14 DIAGNOSIS — F17210 Nicotine dependence, cigarettes, uncomplicated: Secondary | ICD-10-CM | POA: Insufficient documentation

## 2015-02-14 DIAGNOSIS — J209 Acute bronchitis, unspecified: Secondary | ICD-10-CM | POA: Insufficient documentation

## 2015-02-14 DIAGNOSIS — Z8679 Personal history of other diseases of the circulatory system: Secondary | ICD-10-CM | POA: Diagnosis not present

## 2015-02-14 DIAGNOSIS — Z8669 Personal history of other diseases of the nervous system and sense organs: Secondary | ICD-10-CM | POA: Diagnosis not present

## 2015-02-14 DIAGNOSIS — E039 Hypothyroidism, unspecified: Secondary | ICD-10-CM | POA: Insufficient documentation

## 2015-02-14 DIAGNOSIS — Z8639 Personal history of other endocrine, nutritional and metabolic disease: Secondary | ICD-10-CM | POA: Insufficient documentation

## 2015-02-14 DIAGNOSIS — Z91040 Latex allergy status: Secondary | ICD-10-CM | POA: Insufficient documentation

## 2015-02-14 DIAGNOSIS — Z79899 Other long term (current) drug therapy: Secondary | ICD-10-CM | POA: Insufficient documentation

## 2015-02-14 DIAGNOSIS — J40 Bronchitis, not specified as acute or chronic: Secondary | ICD-10-CM

## 2015-02-14 MED ORDER — IPRATROPIUM-ALBUTEROL 0.5-2.5 (3) MG/3ML IN SOLN
3.0000 mL | Freq: Once | RESPIRATORY_TRACT | Status: AC
  Administered 2015-02-14: 3 mL via RESPIRATORY_TRACT
  Filled 2015-02-14: qty 3

## 2015-02-14 MED ORDER — LISINOPRIL 5 MG PO TABS: 5.0000 mg | ORAL_TABLET | Freq: Every day | ORAL | Status: DC

## 2015-02-14 MED ORDER — AZITHROMYCIN 250 MG PO TABS: 250.0000 mg | ORAL_TABLET | Freq: Every day | ORAL | Status: DC

## 2015-02-14 MED ORDER — ALBUTEROL SULFATE HFA 108 (90 BASE) MCG/ACT IN AERS: 1.0000 | INHALATION_SPRAY | Freq: Four times a day (QID) | RESPIRATORY_TRACT | Status: DC | PRN

## 2015-02-14 NOTE — ED Provider Notes (Signed)
CSN: [id number]     Arrival date & time 02/14/15  1008 History   First MD Initiated Contact with Patient 02/14/15 1020     Chief Complaint  Patient presents with  . Cough     (Consider location/radiation/quality/duration/timing/severity/associated sxs/prior Treatment) Patient is a 34 y.o. female presenting with cough. The history is provided by the patient.  Cough Cough characteristics:  Productive Sputum characteristics:  Nondescript Severity:  Moderate Onset quality:  Gradual Duration:  2 weeks Timing:  Constant Progression:  Worsening Chronicity:  New Smoker: no   Context: sick contacts   Relieved by:  Nothing Worsened by:  Nothing tried Ineffective treatments:  None tried Associated symptoms: no chest pain and no fever   Risk factors: no recent infection     Past Medical History  Diagnosis Date  . ANXIETY 08/30/2007  . GRAVES' DISEASE 12/07/2008  . 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 INSOMNIA 08/30/2007  . MIGRAINE HEADACHE 08/30/2007   Past Surgical History  Procedure Laterality Date  . Tubal ligation  2003   Family History  Problem Relation Age of Onset  . Thyroid disease Neg Hx   . Hypertension Mother    Social History  Substance Use Topics  . Smoking status: Current Every Day Smoker -- 1.00 packs/day for 26 years    Types: Cigarettes    Last Attempt to Quit: 06/29/2012  . Smokeless tobacco: Never Used  . Alcohol Use: 0.0 oz/week    0 Standard drinks or equivalent per week     Comment: occasional   OB History    Gravida Para Term Preterm AB TAB SAB Ectopic Multiple Living   Review of Systems  Constitutional: Negative for fever.  Respiratory: Positive for cough.   Cardiovascular: Negative for chest pain.  All other systems reviewed and are negative.     Allergies  Adhesive; Celexa; Latex; and Phenergan  Home Medications   Prior to Admission medications   Medication Sig Start Date End Date Taking? Authorizing  Provider  ALPRAZolam Prudy Feeler) 0.5 MG tablet Take 1 tablet (0.5 mg total) by mouth at bedtime as needed for sleep. Patient taking differently: Take 0.25-0.5 mg by mouth at bedtime as needed for sleep (Restless legs).  05/22/14   Campbell Riches, NP  levothyroxine (SYNTHROID, LEVOTHROID) 150 MCG tablet TAKE ONE TABLET BY MOUTH ONCE DAILY BEFORE BREAKFAST **APPOINTMENT  NEEDED  FOR  FURTHER  REFILLS** 02/02/15   [name], MD  lisinopril (PRINIVIL,ZESTRIL) 5 MG tablet Take 1 tablet (5 mg total) by mouth daily. 05/22/14   Campbell Riches, NP  meclizine (ANTIVERT) 25 MG tablet Take one every 6 hours for dizziness 06/18/14   Bethann Berkshire, MD  methocarbamol (ROBAXIN) 500 MG tablet Take 1 tablet (500 mg total) by mouth 3 (three) times daily. 12/25/14   Tammy Triplett, PA-C  oxyCODONE-acetaminophen (PERCOCET/ROXICET) 5-325 MG tablet Take 1 tablet by mouth every 4 (four) hours as needed. 12/25/14   Tammy Triplett, PA-C  predniSONE (DELTASONE) 10 MG tablet Take 6 tablets day one, 5 tablets day two, 4 tablets day three, 3 tablets day four, 2 tablets day five, then 1 tablet day six 12/25/14   Tammy Triplett, PA-C   BP 154/102 mmHg  Pulse 84  Temp(Src) 97.8 F (36.6 C) (Oral)  Resp 16  SpO2 100%  LMP 01/31/2015 Physical Exam  Constitutional: She is oriented to person, place, and time. She appears well-developed and well-nourished.  HENT:  Head: Normocephalic and atraumatic.  Right Ear: External ear normal.  Left Ear: External ear normal.  Nose: Nose normal.  Mouth/Throat: Oropharynx is clear and moist.  Eyes: EOM are normal. Pupils are equal, round, and reactive to light.  Neck: Normal range of motion.  Cardiovascular: Normal rate and normal heart sounds.   Pulmonary/Chest: She has wheezes.  rhonchi  Abdominal: She exhibits no distension.  Musculoskeletal: Normal range of motion.  Neurological: She is alert and oriented to person, place, and time.  Psychiatric: She has a normal mood and affect.   Nursing note and vitals reviewed.   ED Course  Procedures (including critical care time) Labs Review Labs Reviewed - No data to display  Imaging Review Dg Chest 2 View  02/14/2015  CLINICAL DATA:  Cough for 2 weeks.  Shortness of breath. EXAM: CHEST  2 VIEW COMPARISON:  06/18/2014 FINDINGS: The cardiomediastinal silhouette is within normal limits. The lungs are well inflated and clear. There is no evidence of pleural effusion or pneumothorax. No acute osseous abnormality is identified. IMPRESSION: No active cardiopulmonary disease. Electronically Signed   By: Sebastian AcheAllen  Grady M.D.   On: 02/14/2015 10:55   I have personally reviewed and evaluated these images and lab results as part of my medical decision-making.   EKG Interpretation None      MDM albuterol neb   Final diagnoses:  Bronchitis    Pt given albluteol/atrovent neb with some relief. Rx for albuterol Rx for zithromax Rx for lisinopril.  Pt is out of her blood pressure medication    Elson AreasLeslie K Ivah Girardot, PA-C 02/14/15 1115  Mancel BaleElliott Wentz, MD 02/14/15 [phone]

## 2015-02-14 NOTE — ED Notes (Signed)
Pt states shes had a cough and nasal congestion 2 weeks. Pt states her cough is productive. NAD noted. Pt denies n/v/d.

## 2015-02-14 NOTE — ED Notes (Signed)
RT notified

## 2015-02-14 NOTE — Discharge Instructions (Signed)
How to Use an Inhaler Proper inhaler technique is very important. Good technique ensures that the medicine reaches the lungs. Poor technique results in depositing the medicine on the tongue and back of the throat rather than in the airways. If you do not use the inhaler with good technique, the medicine will not help you. STEPS TO FOLLOW IF USING AN INHALER WITHOUT AN EXTENSION TUBE  Remove the cap from the inhaler.  If you are using the inhaler for the first time, you will need to prime it. Shake the inhaler for 5 seconds and release four puffs into the air, away from your face. Ask your health care provider or pharmacist if you have questions about priming your inhaler.  Shake the inhaler for 5 seconds before each breath in (inhalation).  Position the inhaler so that the top of the canister faces up.  Put your index finger on the top of the medicine canister. Your thumb supports the bottom of the inhaler.  Open your mouth.  Either place the inhaler between your teeth and place your lips tightly around the mouthpiece, or hold the inhaler 1-2 inches away from your open mouth. If you are unsure of which technique to use, ask your health care provider.  Breathe out (exhale) normally and as completely as possible.  Press the canister down with your index finger to release the medicine.  At the same time as the canister is pressed, inhale deeply and slowly until your lungs are completely filled. This should take 4-6 seconds. Keep your tongue down.  Hold the medicine in your lungs for 5-10 seconds (10 seconds is best). This helps the medicine get into the small airways of your lungs.  Breathe out slowly, through pursed lips. Whistling is an example of pursed lips.  Wait at least 15-30 seconds between puffs. Continue with the above steps until you have taken the number of puffs your health care provider has ordered. Do not use the inhaler more than your health care provider tells  you.  Replace the cap on the inhaler.  Follow the directions from your health care provider or the inhaler insert for cleaning the inhaler. STEPS TO FOLLOW IF USING AN INHALER WITH AN EXTENSION (SPACER)  Remove the cap from the inhaler.  If you are using the inhaler for the first time, you will need to prime it. Shake the inhaler for 5 seconds and release four puffs into the air, away from your face. Ask your health care provider or pharmacist if you have questions about priming your inhaler.  Shake the inhaler for 5 seconds before each breath in (inhalation).  Place the open end of the spacer onto the mouthpiece of the inhaler.  Position the inhaler so that the top of the canister faces up and the spacer mouthpiece faces you.  Put your index finger on the top of the medicine canister. Your thumb supports the bottom of the inhaler and the spacer.  Breathe out (exhale) normally and as completely as possible.  Immediately after exhaling, place the spacer between your teeth and into your mouth. Close your lips tightly around the spacer.  Press the canister down with your index finger to release the medicine.  At the same time as the canister is pressed, inhale deeply and slowly until your lungs are completely filled. This should take 4-6 seconds. Keep your tongue down and out of the way.  Hold the medicine in your lungs for 5-10 seconds (10 seconds is best). This helps the  medicine get into the small airways of your lungs. Exhale.  Repeat inhaling deeply through the spacer mouthpiece. Again hold that breath for up to 10 seconds (10 seconds is best). Exhale slowly. If it is difficult to take this second deep breath through the spacer, breathe normally several times through the spacer. Remove the spacer from your mouth.  Wait at least 15-30 seconds between puffs. Continue with the above steps until you have taken the number of puffs your health care provider has ordered. Do not use the  inhaler more than your health care provider tells you.  Remove the spacer from the inhaler, and place the cap on the inhaler.  Follow the directions from your health care provider or the inhaler insert for cleaning the inhaler and spacer. If you are using different kinds of inhalers, use your quick relief medicine to open the airways 10-15 minutes before using a steroid if instructed to do so by your health care provider. If you are unsure which inhalers to use and the order of using them, ask your health care provider, nurse, or respiratory therapist. If you are using a steroid inhaler, always rinse your mouth with water after your last puff, then gargle and spit out the water. Do not swallow the water. AVOID:  Inhaling before or after starting the spray of medicine. It takes practice to coordinate your breathing with triggering the spray.  Inhaling through the nose (rather than the mouth) when triggering the spray. HOW TO DETERMINE IF YOUR INHALER IS FULL OR NEARLY EMPTY You cannot know when an inhaler is empty by shaking it. A few inhalers are now being made with dose counters. Ask your health care provider for a prescription that has a dose counter if you feel you need that extra help. If your inhaler does not have a counter, ask your health care provider to help you determine the date you need to refill your inhaler. Write the refill date on a calendar or your inhaler canister. Refill your inhaler 7-10 days before it runs out. Be sure to keep an adequate supply of medicine. This includes making sure it is not expired, and that you have a spare inhaler.  SEEK MEDICAL CARE IF:   Your symptoms are only partially relieved with your inhaler.  You are having trouble using your inhaler.  You have some increase in phlegm. SEEK IMMEDIATE MEDICAL CARE IF:   You feel little or no relief with your inhalers. You are still wheezing and are feeling shortness of breath or tightness in your chest or  both.  You have dizziness, headaches, or a fast heart rate.  You have chills, fever, or night sweats.  You have a noticeable increase in phlegm production, or there is blood in the phlegm. MAKE SURE YOU:   Understand these instructions.  Will watch your condition.  Will get help right away if you are not doing well or get worse.   This information is not intended to replace advice given to you by your health care provider. Make sure you discuss any questions you have with your health care provider.   Document Released: 03/03/2000 Document Revised: 12/25/2012 Document Reviewed: 10/03/2012 Elsevier Interactive Patient Education 2016 [employer]. Smoking Cessation, Tips for Success If you are ready to quit smoking, congratulations! You have chosen to help yourself be healthier. Cigarettes bring nicotine, tar, carbon monoxide, and other irritants into your body. Your lungs, heart, and blood vessels will be able to work better without these poisons. There  are many different ways to quit smoking. Nicotine gum, nicotine patches, a nicotine inhaler, or nicotine nasal spray can help with physical craving. Hypnosis, support groups, and medicines help break the habit of smoking. WHAT THINGS CAN I DO TO MAKE QUITTING EASIER?  Here are some tips to help you quit for good:  Pick a date when you will quit smoking completely. Tell all of your friends and family about your plan to quit on that date.  Do not try to slowly cut down on the number of cigarettes you are smoking. Pick a quit date and quit smoking completely starting on that day.  Throw away all cigarettes.   Clean and remove all ashtrays from your home, work, and car.  On a card, write down your reasons for quitting. Carry the card with you and read it when you get the urge to smoke.  Cleanse your body of nicotine. Drink enough water and fluids to keep your urine clear or pale yellow. Do this after quitting to flush the nicotine from  your body.  Learn to predict your moods. Do not let a bad situation be your excuse to have a cigarette. Some situations in your life might tempt you into wanting a cigarette.  Never have "just one" cigarette. It leads to wanting another and another. Remind yourself of your decision to quit.  Change habits associated with smoking. If you smoked while driving or when feeling stressed, try other activities to replace smoking. Stand up when drinking your coffee. Brush your teeth after eating. Sit in a different chair when you read the paper. Avoid alcohol while trying to quit, and try to drink fewer caffeinated beverages. Alcohol and caffeine may urge you to smoke.  Avoid foods and drinks that can trigger a desire to smoke, such as sugary or spicy foods and alcohol.  Ask people who smoke not to smoke around you.  Have something planned to do right after eating or having a cup of coffee. For example, plan to take a walk or exercise.  Try a relaxation exercise to calm you down and decrease your stress. Remember, you may be tense and nervous for the first 2 weeks after you quit, but this will pass.  Find new activities to keep your hands busy. Play with a pen, coin, or rubber band. Doodle or draw things on paper.  Brush your teeth right after eating. This will help cut down on the craving for the taste of tobacco after meals. You can also try mouthwash.   Use oral substitutes in place of cigarettes. Try using lemon drops, carrots, cinnamon sticks, or chewing gum. Keep them handy so they are available when you have the urge to smoke.  When you have the urge to smoke, try deep breathing.  Designate your home as a nonsmoking area.  If you are a heavy smoker, ask your health care provider about a prescription for nicotine chewing gum. It can ease your withdrawal from nicotine.  Reward yourself. Set aside the cigarette money you save and buy yourself something nice.  Look for support from others.  Join a support group or smoking cessation program. Ask someone at home or at work to help you with your plan to quit smoking.  Always ask yourself, "Do I need this cigarette or is this just a reflex?" Tell yourself, "Today, I choose not to smoke," or "I do not want to smoke." You are reminding yourself of your decision to quit.  Do not replace cigarette smoking  with electronic cigarettes (commonly called e-cigarettes). The safety of e-cigarettes is unknown, and some may contain harmful chemicals.  If you relapse, do not give up! Plan ahead and think about what you will do the next time you get the urge to smoke. HOW WILL I FEEL WHEN I QUIT SMOKING? You may have symptoms of withdrawal because your body is used to nicotine (the addictive substance in cigarettes). You may crave cigarettes, be irritable, feel very hungry, cough often, get headaches, or have difficulty concentrating. The withdrawal symptoms are only temporary. They are strongest when you first quit but will go away within 10-14 days. When withdrawal symptoms occur, stay in control. Think about your reasons for quitting. Remind yourself that these are signs that your body is healing and getting used to being without cigarettes. Remember that withdrawal symptoms are easier to treat than the major diseases that smoking can cause.  Even after the withdrawal is over, expect periodic urges to smoke. However, these cravings are generally short lived and will go away whether you smoke or not. Do not smoke! WHAT RESOURCES ARE AVAILABLE TO HELP ME QUIT SMOKING? Your health care provider can direct you to community resources or hospitals for support, which may include:  Group support.  Education.  Hypnosis.  Therapy.   This information is not intended to replace advice given to you by your health care provider. Make sure you discuss any questions you have with your health care provider.   Document Released: 12/03/2003 Document Revised:  03/27/2014 Document Reviewed: 08/22/2012 Elsevier Interactive Patient Education 2016 Elsevier Inc[employer] Acute Bronchitis Bronchitis is inflammation of the airways that extend from the windpipe into the lungs (bronchi). The inflammation often causes mucus to develop. This leads to a cough, which is the most common symptom of bronchitis.  In acute bronchitis, the condition usually develops suddenly and goes away over time, usually in a couple weeks. Smoking, allergies, and asthma can make bronchitis worse. Repeated episodes of bronchitis may cause further lung problems.  CAUSES Acute bronchitis is most often caused by the same virus that causes a cold. The virus can spread from person to person (contagious) through coughing, sneezing, and touching contaminated objects. SIGNS AND SYMPTOMS   Cough.   Fever.   Coughing up mucus.   Body aches.   Chest congestion.   Chills.   Shortness of breath.   Sore throat.  DIAGNOSIS  Acute bronchitis is usually diagnosed through a physical exam. Your health care provider will also ask you questions about your medical history. Tests, such as chest X-rays, are sometimes done to rule out other conditions.  TREATMENT  Acute bronchitis usually goes away in a couple weeks. Oftentimes, no medical treatment is necessary. Medicines are sometimes given for relief of fever or cough. Antibiotic medicines are usually not needed but may be prescribed in certain situations. In some cases, an inhaler may be recommended to help reduce shortness of breath and control the cough. A cool mist vaporizer may also be used to help thin bronchial secretions and make it easier to clear the chest.  HOME CARE INSTRUCTIONS  Get plenty of rest.   Drink enough fluids to keep your urine clear or pale yellow (unless you have a medical condition that requires fluid restriction). Increasing fluids may help thin your respiratory secretions (sputum) and reduce chest congestion, and it  will prevent dehydration.   Take medicines only as directed by your health care provider.  If you were prescribed an antibiotic medicine, finish it  all even if you start to feel better.  Avoid smoking and secondhand smoke. Exposure to cigarette smoke or irritating chemicals will make bronchitis worse. If you are a smoker, consider using nicotine gum or skin patches to help control withdrawal symptoms. Quitting smoking will help your lungs heal faster.   Reduce the chances of another bout of acute bronchitis by washing your hands frequently, avoiding people with cold symptoms, and trying not to touch your hands to your mouth, nose, or eyes.   Keep all follow-up visits as directed by your health care provider.  SEEK MEDICAL CARE IF: Your symptoms do not improve after 1 week of treatment.  SEEK IMMEDIATE MEDICAL CARE IF:  You develop an increased fever or chills.   You have chest pain.   You have severe shortness of breath.  You have bloody sputum.   You develop dehydration.  You faint or repeatedly feel like you are going to pass out.  You develop repeated vomiting.  You develop a severe headache. MAKE SURE YOU:   Understand these instructions.  Will watch your condition.  Will get help right away if you are not doing well or get worse.   This information is not intended to replace advice given to you by your health care provider. Make sure you discuss any questions you have with your health care provider.   Document Released: 04/13/2004 Document Revised: 03/27/2014 Document Reviewed: 08/27/2012 Elsevier Interactive Patient Education [employer]

## 2015-02-19 ENCOUNTER — Encounter: Payer: Self-pay | Admitting: "Endocrinology

## 2015-02-19 ENCOUNTER — Ambulatory Visit (INDEPENDENT_AMBULATORY_CARE_PROVIDER_SITE_OTHER): Payer: Medicaid Other | Admitting: "Endocrinology

## 2015-02-19 VITALS — BP 134/94 | HR 84 | Ht 67.0 in | Wt 226.0 lb

## 2015-02-19 DIAGNOSIS — E032 Hypothyroidism due to medicaments and other exogenous substances: Secondary | ICD-10-CM

## 2015-02-19 LAB — T4, FREE: Free T4: 1.11 ng/dL (ref 0.80–1.80)

## 2015-02-19 LAB — TSH: TSH: 17.399 u[IU]/mL — ABNORMAL HIGH (ref 0.350–4.500)

## 2015-02-19 NOTE — Progress Notes (Signed)
Subjective:    Patient ID: Teresa Vincent, female    DOB: 1980/04/13, PCP Lubertha South, MD   Past Medical History  Diagnosis Date  . ANXIETY 08/30/2007  . GRAVES' DISEASE 12/07/2008  . 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 INSOMNIA 08/30/2007  . MIGRAINE HEADACHE 08/30/2007   Past Surgical History  Procedure Laterality Date  . Tubal ligation  2003   Social History   Social History  . Marital Status: Divorced    Spouse Name: N/A  . Number of Children: N/A  . Years of Education: N/A   Occupational History  . Doesn't work outside the home    Social History Main Topics  . Smoking status: Current Every Day Smoker -- 1.00 packs/day for 26 years    Types: Cigarettes    Last Attempt to Quit: 06/29/2012  . Smokeless tobacco: Never Used  . Alcohol Use: 0.0 oz/week    0 Standard drinks or equivalent per week     Comment: occasional  . Drug Use: No  . Sexual Activity: Not Asked   Other Topics Concern  . None   Social History Narrative   Outpatient Encounter Prescriptions as of 02/19/2015  Medication Sig  . albuterol (PROVENTIL HFA;VENTOLIN HFA) 108 (90 BASE) MCG/ACT inhaler Inhale 1-2 puffs into the lungs every 6 (six) hours as needed for wheezing or shortness of breath.  . ALPRAZolam (XANAX) 0.5 MG tablet Take 1 tablet (0.5 mg total) by mouth at bedtime as needed for sleep. (Patient taking differently: Take 0.25-0.5 mg by mouth at bedtime as needed for sleep (Restless legs). )  . diazepam (VALIUM) 5 MG tablet Take 5 mg by mouth at bedtime as needed for anxiety.  Marland Kitchen levothyroxine (SYNTHROID, LEVOTHROID) 150 MCG tablet TAKE ONE TABLET BY MOUTH ONCE DAILY BEFORE BREAKFAST **APPOINTMENT  NEEDED  FOR  FURTHER  REFILLS**  . lisinopril (PRINIVIL,ZESTRIL) 5 MG tablet Take 1 tablet (5 mg total) by mouth daily.  . meclizine (ANTIVERT) 25 MG tablet Take one every 6 hours for dizziness  . oxyCODONE-acetaminophen (PERCOCET/ROXICET) 5-325 MG tablet Take 1 tablet by mouth every 4  (four) hours as needed.  Marland Kitchen azithromycin (ZITHROMAX) 250 MG tablet Take 1 tablet (250 mg total) by mouth daily. Take first 2 tablets together, then 1 every day until finished.  . methocarbamol (ROBAXIN) 500 MG tablet Take 1 tablet (500 mg total) by mouth 3 (three) times daily.  . predniSONE (DELTASONE) 10 MG tablet Take 6 tablets day one, 5 tablets day two, 4 tablets day three, 3 tablets day four, 2 tablets day five, then 1 tablet day six   No facility-administered encounter medications on file as of 02/19/2015.   ALLERGIES: Allergies  Allergen Reactions  . Adhesive [Tape] Other (See Comments)    Skin irritation, patient prefers paper tape.   Budd Palmer [Citalopram Hydrobromide] Other (See Comments)    Make patient too carefree  . Latex Itching and Swelling  . Phenergan [Promethazine Hcl] Nausea And Vomiting   VACCINATION STATUS: Immunization History  Administered Date(s) Administered  . Td 05/11/2006    HPI  Teresa Vincent is a 34 year old female patient with medical history as above. She is being seen in consultation for hypothyroidism requested by Dr. Lubertha South. Her history is significant for treatment for Graves' disease with RAI on 01/06/2009. She was treated with various doses of levothyroxine over the years currently at 150 g by mouth every morning. She admits that she skips doses on occasions. She complains of progressive weight gain, fatigue,  and hair loss. She has been trying different ways to lose weight including exercise and dietary modifications. She does not have recent thyroid function tests. She denies any family history of thyroid dysfunction. She denies any personal history of goiter nor family history of thyroid cancer. She denies heat or cold intolerance, palpitations, tremors.  Review of Systems  Constitutional: + weight gain,  + fatigue, no subjective hyperthermia/hypothermia Eyes: no blurry vision, no xerophthalmia ENT: no sore throat, no nodules palpated in  throat, no dysphagia/odynophagia, no hoarseness Cardiovascular: no CP/SOB/palpitations/leg swelling Respiratory: no cough/SOB Gastrointestinal: no N/V/D/C Musculoskeletal: no muscle/joint aches Skin: no rashes Neurological: no tremors/numbness/tingling/dizziness Psychiatric: no depression/anxiety  Objective:    BP 134/94 mmHg  Pulse 84  Ht 5\' 7"  (1.702 m)  Wt 226 lb (102.513 kg)  BMI 35.39 kg/m2  SpO2 96%  LMP 01/31/2015  Wt Readings from Last 3 Encounters:  02/19/15 226 lb (102.513 kg)  12/25/14 212 lb (96.163 kg)  10/30/14 200 lb (90.719 kg)    Physical Exam  Constitutional: overweight, in NAD Eyes: PERRLA, EOMI, no exophthalmos ENT: moist mucous membranes, no thyromegaly, no cervical lymphadenopathy Cardiovascular: RRR, No MRG Respiratory: CTA B Gastrointestinal: abdomen soft, NT, ND, BS+ Musculoskeletal: no deformities, strength intact in all 4 Skin: moist, warm, no rashes Neurological: no tremor with outstretched hands, DTR normal in all 4   Complete Blood Count (Most recent): Lab Results  Component Value Date   WBC 7.4 09/08/2014   HGB 13.3 09/08/2014   HCT 40.1 09/08/2014   MCV 91.6 09/08/2014   PLT 219 09/08/2014   Chemistry (most recent): Lab Results  Component Value Date   NA 138 09/08/2014   K 3.6 09/08/2014   CL 106 09/08/2014   CO2 27 09/08/2014   BUN 13 09/08/2014   CREATININE 1.07* 09/08/2014    Assessment & Plan:   1. Hypothyroidism due to medicaments and other exogenous substances -Patient has established diagnosis of RAI induced hypothyroidism. I discussed with her the fact that she would need thyroid hormone replacement for life. I will proceed to obtain fresh set of thyroid function tests today. I will see her back in a week to see if I have to adjust the doses of her levothyroxine. In the meantime,  she is advised to continue levothyroxine 150 g by mouth every morning.  - We discussed about correct intake of levothyroxine, at fasting,  with water, separated by at least 30 minutes from breakfast, and separated by more than 4 hours from calcium, iron, multivitamins, acid reflux medications (PPIs). -Patient is made aware of the fact that thyroid hormone replacement is needed for life, dose to be adjusted by periodic monitoring of thyroid function tests. -For weight gain, I have discussed a diet program higher in protein and lower in carbohydrates coupled with regular exercise combining aerobics, strength, and a stretch components.  - I advised patient to maintain close follow up with Lubertha SouthSteve Luking, MD for primary care needs. Follow up plan: Return in about 1 week (around 02/26/2015) for underactive thyroid.  Marquis LunchGebre Paralee Pendergrass, MD Phone: [phone]  Fax: [phone]   02/19/2015, 1:13 PM

## 2015-02-22 ENCOUNTER — Encounter (HOSPITAL_COMMUNITY): Payer: Self-pay | Admitting: Emergency Medicine

## 2015-02-22 ENCOUNTER — Emergency Department (HOSPITAL_COMMUNITY)
Admission: EM | Admit: 2015-02-22 | Discharge: 2015-02-22 | Disposition: A | Discharge status: Home / Self Care | Payer: Medicaid Other | Attending: Emergency Medicine | Admitting: Emergency Medicine

## 2015-02-22 DIAGNOSIS — G43909 Migraine, unspecified, not intractable, without status migrainosus: Secondary | ICD-10-CM | POA: Insufficient documentation

## 2015-02-22 DIAGNOSIS — M25512 Pain in left shoulder: Secondary | ICD-10-CM | POA: Insufficient documentation

## 2015-02-22 DIAGNOSIS — Z79899 Other long term (current) drug therapy: Secondary | ICD-10-CM | POA: Diagnosis not present

## 2015-02-22 DIAGNOSIS — F17210 Nicotine dependence, cigarettes, uncomplicated: Secondary | ICD-10-CM | POA: Insufficient documentation

## 2015-02-22 DIAGNOSIS — M436 Torticollis: Secondary | ICD-10-CM | POA: Diagnosis not present

## 2015-02-22 DIAGNOSIS — G4700 Insomnia, unspecified: Secondary | ICD-10-CM | POA: Diagnosis not present

## 2015-02-22 DIAGNOSIS — Z91040 Latex allergy status: Secondary | ICD-10-CM | POA: Insufficient documentation

## 2015-02-22 DIAGNOSIS — E039 Hypothyroidism, unspecified: Secondary | ICD-10-CM | POA: Diagnosis not present

## 2015-02-22 DIAGNOSIS — M542 Cervicalgia: Secondary | ICD-10-CM | POA: Diagnosis present

## 2015-02-22 DIAGNOSIS — F419 Anxiety disorder, unspecified: Secondary | ICD-10-CM | POA: Insufficient documentation

## 2015-02-22 HISTORY — DX: Cervicalgia: M54.2

## 2015-02-22 MED ORDER — DICLOFENAC SODIUM 50 MG PO TBEC: 50.0000 mg | DELAYED_RELEASE_TABLET | Freq: Two times a day (BID) | ORAL | Status: DC

## 2015-02-22 MED ORDER — CYCLOBENZAPRINE HCL 10 MG PO TABS
10.0000 mg | ORAL_TABLET | Freq: Once | ORAL | Status: AC
  Administered 2015-02-22: 10 mg via ORAL
  Filled 2015-02-22: qty 1

## 2015-02-22 MED ORDER — OXYCODONE-ACETAMINOPHEN 5-325 MG PO TABS
1.0000 | ORAL_TABLET | Freq: Once | ORAL | Status: AC
  Administered 2015-02-22: 1 via ORAL
  Filled 2015-02-22: qty 1

## 2015-02-22 MED ORDER — OXYCODONE-ACETAMINOPHEN 5-325 MG PO TABS: 1.0000 | ORAL_TABLET | Freq: Four times a day (QID) | ORAL | Status: DC | PRN

## 2015-02-22 MED ORDER — KETOROLAC TROMETHAMINE 60 MG/2ML IM SOLN
60.0000 mg | Freq: Once | INTRAMUSCULAR | Status: AC
  Administered 2015-02-22: 60 mg via INTRAMUSCULAR
  Filled 2015-02-22: qty 2

## 2015-02-22 MED ORDER — CYCLOBENZAPRINE HCL 10 MG PO TABS: 10.0000 mg | ORAL_TABLET | Freq: Two times a day (BID) | ORAL | Status: DC | PRN

## 2015-02-22 NOTE — Discharge Instructions (Signed)
Acute Torticollis °Torticollis is a condition in which the muscles of the neck tighten (contract) abnormally, causing the neck to twist and the head to move into an unnatural position. Torticollis that develops suddenly is called acute torticollis. If torticollis becomes chronic and is left untreated, the face and neck can become deformed. °CAUSES °This condition may be caused by: °· Sleeping in an awkward position (common). °· Extending or twisting the neck muscles beyond their normal position. °· Infection. °In some cases, the cause may not be known. °SYMPTOMS °Symptoms of this condition include: °· An unnatural position of the head. °· Neck pain. °· A limited ability to move the neck. °· Twisting of the neck to one side. °DIAGNOSIS °This condition is diagnosed with a physical exam. You may also have imaging tests, such as an X-ray, CT scan, or MRI. °TREATMENT °Treatment for this condition involves trying to relax the neck muscles. It may include: °· Medicines or shots. °· Physical therapy. °· Surgery. This may be done in severe cases. °HOME CARE INSTRUCTIONS °· Take medicines only as directed by your health care provider. °· Do stretching exercises and massage your neck as directed by your health care provider. °· Keep all follow-up visits as directed by your health care provider. This is important. °SEEK MEDICAL CARE IF: °· You develop a fever. °SEEK IMMEDIATE MEDICAL CARE IF: °· You develop difficulty breathing. °· You develop noisy breathing (stridor). °· You start drooling. °· You have trouble swallowing or have pain with swallowing. °· You develop numbness or weakness in your hands or feet. °· You have changes in your speech, understanding, or vision. °· Your pain gets worse. °  °This information is not intended to replace advice given to you by your health care provider. Make sure you discuss any questions you have with your health care provider. °  °Document Released: 03/03/2000 Document Revised:  07/21/2014 Document Reviewed: 03/02/2014 °Elsevier Interactive Patient Education ©2016 [employer] Inc[employer] ° °

## 2015-02-22 NOTE — ED Notes (Signed)
Stiff neck this am.  History of neck pain.  Rates pain 8/10.

## 2015-02-22 NOTE — ED Provider Notes (Signed)
CSN: [id number]     Arrival date & time 02/22/15  1800 History  By signing my name below, I, Placido Sou, attest that this documentation has been prepared under the direction and in the presence of Kerrie Buffalo, NP. Electronically Signed: Placido Sou, ED Scribe. 02/22/2015. 7:36 PM.   Chief Complaint  Patient presents with  . Torticollis   Patient is a 34 y.o. female presenting with neck injury. The history is provided by the patient. No language interpreter was used.  Neck Injury This is a chronic problem. The current episode started 6 to 12 hours ago. The problem occurs every several days. The problem has not changed since onset.Associated symptoms include headaches. The symptoms are relieved by narcotics.    HPI Comments: BERLIN VIERECK is a 34 y.o. female with a hx of neck pain who presents to the Emergency Department complaining of constant, moderate, left sided neck pain that woke her this morning and has been worsening throughout the day. She had a similar problem a couple months ago and was evaluated here and had x-ray of her neck and was treated for muscle spasm. She notes a radiation of pain throughout the left side of her head. She also complains of pain to the left posterior shoulder that goes down the back of her arm to her elbow. Pt notes having taken robaxin and flexeril in the past for pain management further noting that the flexeril provided relief and the robaxin provided no relief.  Pt confirms her listed PCP. She denies any other associated symptoms at this time.   Past Medical History  Diagnosis Date  . ANXIETY 08/30/2007  . GRAVES' DISEASE 12/07/2008  . 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 INSOMNIA 08/30/2007  . MIGRAINE HEADACHE 08/30/2007  . Neck pain    Past Surgical History  Procedure Laterality Date  . Tubal ligation  2003   Family History  Problem Relation Age of Onset  . Thyroid disease Neg Hx   . Hypertension Mother    Social History   Substance Use Topics  . Smoking status: Current Every Day Smoker -- 1.00 packs/day for 26 years    Types: Cigarettes    Last Attempt to Quit: 06/29/2012  . Smokeless tobacco: Never Used  . Alcohol Use: 0.0 oz/week    0 Standard drinks or equivalent per week     Comment: occasional   OB History    Gravida Para Term Preterm AB TAB SAB Ectopic Multiple Living   Review of Systems  Musculoskeletal: Positive for neck pain.  Neurological: Positive for headaches.  All other systems reviewed and are negative.  Allergies  Adhesive; Celexa; Latex; and Phenergan  Home Medications   Prior to Admission medications   Medication Sig Start Date End Date Taking? Authorizing Provider  albuterol (PROVENTIL HFA;VENTOLIN HFA) 108 (90 BASE) MCG/ACT inhaler Inhale 1-2 puffs into the lungs every 6 (six) hours as needed for wheezing or shortness of breath. 02/14/15   Elson Areas, PA-C  ALPRAZolam Prudy Feeler) 0.5 MG tablet Take 1 tablet (0.5 mg total) by mouth at bedtime as needed for sleep. Patient taking differently: Take 0.25-0.5 mg by mouth at bedtime as needed for sleep (Restless legs).  05/22/14   Campbell Riches, NP  azithromycin (ZITHROMAX) 250 MG tablet Take 1 tablet (250 mg total) by mouth daily. Take first 2 tablets together, then 1 every day until finished. 02/14/15   Elson Areas,  PA-C  cyclobenzaprine (FLEXERIL) 10 MG tablet Take 1 tablet (10 mg total) by mouth 2 (two) times daily as needed for muscle spasms. 02/22/15   Hope Orlene Och, NP  diazepam (VALIUM) 5 MG tablet Take 5 mg by mouth at bedtime as needed for anxiety.    Historical Provider, MD  diclofenac (VOLTAREN) 50 MG EC tablet Take 1 tablet (50 mg total) by mouth 2 (two) times daily. 02/22/15   Hope Orlene Och, NP  levothyroxine (SYNTHROID, LEVOTHROID) 150 MCG tablet TAKE ONE TABLET BY MOUTH ONCE DAILY BEFORE BREAKFAST **APPOINTMENT  NEEDED  FOR  FURTHER  REFILLS** 02/02/15   [name], MD  lisinopril  (PRINIVIL,ZESTRIL) 5 MG tablet Take 1 tablet (5 mg total) by mouth daily. 02/14/15   Elson Areas, PA-C  meclizine (ANTIVERT) 25 MG tablet Take one every 6 hours for dizziness 06/18/14   Bethann Berkshire, MD  oxyCODONE-acetaminophen (PERCOCET/ROXICET) 5-325 MG tablet Take 1 tablet by mouth every 6 (six) hours as needed for severe pain. 02/22/15   Hope Orlene Och, NP  predniSONE (DELTASONE) 10 MG tablet Take 6 tablets day one, 5 tablets day two, 4 tablets day three, 3 tablets day four, 2 tablets day five, then 1 tablet day six 12/25/14   Tammy Triplett, PA-C   BP 164/110 mmHg  Pulse 77  Temp(Src) 97.5 F (36.4 C) (Oral)  Resp 18  Ht  (1.702 m)  Wt 102.513 kg  BMI 35.39 kg/m2  SpO2 100%  LMP 01/31/2015 Physical Exam  Constitutional: She is oriented to person, place, and time. She appears well-developed and well-nourished.  HENT:  Head: Normocephalic and atraumatic.  Mouth/Throat: No oropharyngeal exudate.  Neck: Normal range of motion. No tracheal deviation present.  Muscular tenderness and spasm to the left side of the neck  Cardiovascular: Normal rate.   Pulses:      Radial pulses are 2+ on the right side, and 2+ on the left side.  Pulmonary/Chest: Effort normal. No respiratory distress.  Abdominal: Soft. There is no tenderness.  Musculoskeletal: Normal range of motion.  Neurological: She is alert and oriented to person, place, and time.  Grip strength equal bilaterally  Skin: Skin is warm and dry. She is not diaphoretic.  Psychiatric: She has a normal mood and affect. Her behavior is normal.  Nursing note and vitals reviewed.  ED Course  Procedures  Percocet and flexeril PO, Toradol IM Warm blanket to neck  Patient re evaluated after medication and she reports that her headache is gone and her neck is feeling much better. She continues to have some posterior shoulder pain.   DIAGNOSTIC STUDIES: Oxygen Saturation is 100% on RA, normal by my interpretation.    COORDINATION OF  CARE: 7:34 PM Pt presents today due to an exacerbation of her chronic neck pain. Discussed treatment plan with pt at bedside including 1x medication for pain management and reevaluation. Pt agreed to plan. Imaging Review  Review of previous C/spine film showed DDD without acute findings.   MDM  34 y.o. female with left side neck pain that she noted upon awaking this morning. Stable for d/c without focal neuro deficits. Will treat for pain and muscle spasm and she will follow up with her PCP or return here as needed. Discussed with the patient elevated BP. She reports having taken her BP medication today but has an appointment to follow up with her PCP and will discuss medication management if it is still elevated.   Final diagnoses:  Left torticollis  I personally performed the services described in this documentation, which was scribed in my presence. The recorded information has been reviewed and is accurate.   ViroquaHope M Neese, [place]NP 02/22/15 2213  Marily MemosJason Mesner, MD 02/26/15 [phone]

## 2015-02-25 ENCOUNTER — Encounter: Payer: Self-pay | Admitting: Nurse Practitioner

## 2015-02-25 ENCOUNTER — Ambulatory Visit (INDEPENDENT_AMBULATORY_CARE_PROVIDER_SITE_OTHER): Payer: Medicaid Other | Admitting: Nurse Practitioner

## 2015-02-25 VITALS — BP 132/82 | Ht 67.0 in | Wt 226.4 lb

## 2015-02-25 DIAGNOSIS — I10 Essential (primary) hypertension: Secondary | ICD-10-CM | POA: Diagnosis not present

## 2015-02-25 MED ORDER — PHENTERMINE HCL 37.5 MG PO TABS: 37.5000 mg | ORAL_TABLET | Freq: Every day | ORAL | Status: DC

## 2015-02-27 ENCOUNTER — Encounter: Payer: Self-pay | Admitting: Nurse Practitioner

## 2015-02-27 NOTE — Progress Notes (Signed)
Subjective:  Presents to discuss her weight. Limited activity. Has been emotional eating for several months causing significant weight gain. No CP/ischemic type pain or SOB. Has taken Phentermine without difficulty in the past. Would like to try this again. Continues follow up with endocrinology.   Objective:   BP 132/82 mmHg  Ht 5\' 7"  (1.702 m)  Wt 226 lb 6.4 oz (102.694 kg)  BMI 35.45 kg/m2  LMP 01/31/2015 NAD. Alert, oriented. Lungs clear. Heart RRR. Has gained over 25 lbs since August.   Assessment:  Problem List Items Addressed This Visit      Cardiovascular and Mediastinum   Essential hypertension, benign - Primary    Other Visit Diagnoses    Morbid obesity due to excess calories (HCC)        Relevant Medications    phentermine (ADIPEX-P) 37.5 MG tablet      Plan:  Meds ordered this encounter  Medications  . phentermine (ADIPEX-P) 37.5 MG tablet    Sig: Take 1 tablet (37.5 mg total) by mouth daily before breakfast.    Dispense:  30 tablet    Refill:  2    Order Specific Question:  Supervising Provider    Answer:  Merlyn AlbertLUKING, WILLIAM S [[id number]]   Encouraged healthy diet, regular activity. DC med and call if any problems.  Recheck in 3 months if she wants to continue weight loss med.

## 2015-03-01 ENCOUNTER — Other Ambulatory Visit: Payer: Self-pay

## 2015-03-01 ENCOUNTER — Telehealth: Payer: Self-pay | Admitting: Family Medicine

## 2015-03-01 ENCOUNTER — Ambulatory Visit (INDEPENDENT_AMBULATORY_CARE_PROVIDER_SITE_OTHER): Payer: Medicaid Other | Admitting: "Endocrinology

## 2015-03-01 ENCOUNTER — Encounter: Payer: Self-pay | Admitting: "Endocrinology

## 2015-03-01 VITALS — BP 145/91 | HR 106 | Ht 67.0 in | Wt 221.0 lb

## 2015-03-01 DIAGNOSIS — E032 Hypothyroidism due to medicaments and other exogenous substances: Secondary | ICD-10-CM

## 2015-03-01 DIAGNOSIS — I10 Essential (primary) hypertension: Secondary | ICD-10-CM

## 2015-03-01 MED ORDER — LISINOPRIL 5 MG PO TABS: 5.0000 mg | ORAL_TABLET | Freq: Every day | ORAL | Status: DC

## 2015-03-01 MED ORDER — LEVOTHYROXINE SODIUM 175 MCG PO TABS: ORAL_TABLET | ORAL | Status: DC

## 2015-03-01 MED ORDER — LISINOPRIL 10 MG PO TABS: 10.0000 mg | ORAL_TABLET | Freq: Every day | ORAL | Status: DC

## 2015-03-01 NOTE — Progress Notes (Signed)
Subjective:    Patient ID: Teresa Vincent, female    DOB: 18-Apr-1980, PCP Lubertha South, MD   Past Medical History  Diagnosis Date  . ANXIETY 08/30/2007  . GRAVES' DISEASE 12/07/2008  . 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 INSOMNIA 08/30/2007  . MIGRAINE HEADACHE 08/30/2007  . Neck pain    Past Surgical History  Procedure Laterality Date  . Tubal ligation  2003   Social History   Social History  . Marital Status: Divorced    Spouse Name: N/A  . Number of Children: N/A  . Years of Education: N/A   Occupational History  . Doesn't work outside the home    Social History Main Topics  . Smoking status: Current Every Day Smoker -- 1.00 packs/day for 26 years    Types: Cigarettes    Last Attempt to Quit: 06/29/2012  . Smokeless tobacco: Never Used  . Alcohol Use: 0.0 oz/week    0 Standard drinks or equivalent per week     Comment: occasional  . Drug Use: No  . Sexual Activity: Not Asked   Other Topics Concern  . None   Social History Narrative   Outpatient Encounter Prescriptions as of 03/01/2015  Medication Sig  . albuterol (PROVENTIL HFA;VENTOLIN HFA) 108 (90 BASE) MCG/ACT inhaler Inhale 1-2 puffs into the lungs every 6 (six) hours as needed for wheezing or shortness of breath.  . cyclobenzaprine (FLEXERIL) 10 MG tablet Take 1 tablet (10 mg total) by mouth 2 (two) times daily as needed for muscle spasms.  . diazepam (VALIUM) 5 MG tablet Take 5 mg by mouth at bedtime as needed for anxiety.  . diclofenac (VOLTAREN) 50 MG EC tablet Take 1 tablet (50 mg total) by mouth 2 (two) times daily.  Marland Kitchen levothyroxine (SYNTHROID, LEVOTHROID) 175 MCG tablet TAKE ONE TABLET BY MOUTH ONCE DAILY BEFORE BREAKFAST **APPOINTMENT  NEEDED  FOR  FURTHER  REFILLS**  . lisinopril (PRINIVIL,ZESTRIL) 10 MG tablet Take 1 tablet (10 mg total) by mouth daily.  . meclizine (ANTIVERT) 25 MG tablet Take one every 6 hours for dizziness  . oxyCODONE-acetaminophen (PERCOCET/ROXICET) 5-325 MG  tablet Take 1 tablet by mouth every 6 (six) hours as needed for severe pain.  . phentermine (ADIPEX-P) 37.5 MG tablet Take 1 tablet (37.5 mg total) by mouth daily before breakfast.  . [DISCONTINUED] levothyroxine (SYNTHROID, LEVOTHROID) 150 MCG tablet TAKE ONE TABLET BY MOUTH ONCE DAILY BEFORE BREAKFAST **APPOINTMENT  NEEDED  FOR  FURTHER  REFILLS**  . [DISCONTINUED] lisinopril (PRINIVIL,ZESTRIL) 5 MG tablet Take 1 tablet (5 mg total) by mouth daily.  Marland Kitchen ALPRAZolam (XANAX) 0.5 MG tablet Take 1 tablet (0.5 mg total) by mouth at bedtime as needed for sleep. (Patient not taking: Reported on 03/01/2015)  . [DISCONTINUED] lisinopril (PRINIVIL,ZESTRIL) 5 MG tablet Take 1 tablet (5 mg total) by mouth daily.  . [DISCONTINUED] lisinopril (PRINIVIL,ZESTRIL) 5 MG tablet Take 1 tablet (5 mg total) by mouth daily.   No facility-administered encounter medications on file as of 03/01/2015.   ALLERGIES: Allergies  Allergen Reactions  . Adhesive [Tape] Other (See Comments)    Skin irritation, patient prefers paper tape.   Budd Palmer [Citalopram Hydrobromide] Other (See Comments)    Make patient too carefree  . Latex Itching and Swelling  . Phenergan [Promethazine Hcl] Nausea And Vomiting   VACCINATION STATUS: Immunization History  Administered Date(s) Administered  . Td 05/11/2006    HPI  Teresa Vincent is a 34 year old female patient with medical history as above. She is  here to follow-up for hypothyroidism. Her history is significant for treatment for Graves' disease with RAI on 01/06/2009. She was treated with various doses of levothyroxine over the years currently at 150 g by mouth every morning. She has been more consistent in taking her thyroid hormone. She has no new complaints.  She was sent for repeat thyroid function tests. She denies any family history of thyroid dysfunction. She denies any personal history of goiter nor family history of thyroid cancer. She denies heat or cold intolerance,  palpitations, tremors.  Review of Systems  Constitutional: + weight gain,  + fatigue, no subjective hyperthermia/hypothermia Eyes: no blurry vision, no xerophthalmia ENT: no sore throat, no nodules palpated in throat, no dysphagia/odynophagia, no hoarseness Cardiovascular: no CP/SOB/palpitations/leg swelling Respiratory: no cough/SOB Gastrointestinal: no N/V/D/C Musculoskeletal: no muscle/joint aches Skin: no rashes Neurological: no tremors/numbness/tingling/dizziness Psychiatric: no depression/anxiety  Objective:    BP 145/91 mmHg  Pulse 106  Ht  (1.702 m)  Wt 221 lb (100.245 kg)  BMI 34.61 kg/m2  SpO2 98%  LMP 01/31/2015  Wt Readings from Last 3 Encounters:  03/01/15 221 lb (100.245 kg)  02/25/15 226 lb 6.4 oz (102.694 kg)  02/22/15 226 lb (102.513 kg)    Physical Exam  Constitutional: overweight, in NAD Eyes: PERRLA, EOMI, no exophthalmos ENT: moist mucous membranes, no thyromegaly, no cervical lymphadenopathy Cardiovascular: RRR, No MRG Respiratory: CTA B Gastrointestinal: abdomen soft, NT, ND, BS+ Musculoskeletal: no deformities, strength intact in all 4 Skin: moist, warm, no rashes Neurological: no tremor with outstretched hands, DTR normal in all 4   Complete Blood Count (Most recent): Lab Results  Component Value Date   WBC 7.4 09/08/2014   HGB 13.3 09/08/2014   HCT 40.1 09/08/2014   MCV 91.6 09/08/2014   PLT 219 09/08/2014   Chemistry (most recent): Lab Results  Component Value Date   NA 138 09/08/2014   K 3.6 09/08/2014   CL 106 09/08/2014   CO2 27 09/08/2014   BUN 13 09/08/2014   CREATININE 1.07* 09/08/2014   Recent Results (from the past 2160 hour(s))  T4, free     Status: None   Collection Time: 02/19/15  9:59 AM  Result Value Ref Range   Free T4 1.11 0.80 - 1.80 ng/dL  TSH     Status: Abnormal   Collection Time: 02/19/15  9:59 AM  Result Value Ref Range   TSH 17.399 (H) 0.350 - 4.500 uIU/mL    Assessment & Plan:   1.  Hypothyroidism due to medicaments and other exogenous substances -Based on her repeat thyroid function tests, she would benefit from slight increase in her thyroid hormone. I would increase her Levothyroxine to 175 g by mouth every morning.  - We discussed about correct intake of levothyroxine, at fasting, with water, separated by at least 30 minutes from breakfast, and separated by more than 4 hours from calcium, iron, multivitamins, acid reflux medications (PPIs). -Patient is made aware of the fact that thyroid hormone replacement is needed for life, dose to be adjusted by periodic monitoring of thyroid function tests. -For weight gain, I have discussed a diet program higher in protein and lower in carbohydrates coupled with regular exercise combining aerobics, strength, and a stretch components. 2.hypertension: Uncontrolled I advised her to increase  Lisinopril to 10 mg by mouth daily.  - I advised patient to maintain close follow up with Lubertha South, MD for primary care needs. Follow up plan: Return in about 6 months (around 08/30/2015) for underactive thyroid,  high blood pressure, follow up with pre-visit labs.  Marquis LunchGebre Nida, MD Phone: [phone]  Fax: [phone]   03/01/2015, 4:56 PM

## 2015-03-01 NOTE — Telephone Encounter (Signed)
10 mg dose fine, may need to decr later when coming down off pnerntermine

## 2015-03-01 NOTE — Telephone Encounter (Signed)
Called patient and informed her per protocol Lisinopril was sent into requested pharmacy. Patient verbalized understanding.

## 2015-03-01 NOTE — Telephone Encounter (Signed)
Pt is requesting a refill on her lisinopril.    [facility] [place]

## 2015-03-01 NOTE — Telephone Encounter (Signed)
Pt came by wanting to talk to a nurse regarding her lisinopril. Pt was seen by Dr. Fransico HimNida today and he increased her lisinopril to 10 mg daily. Pt is wanting to make sure that that dosage is ok because she is still taking the phentermine. Pt wants to make sure that taking the increased dosage with the phentermine that it won't affect her bp. Please advise.

## 2015-03-01 NOTE — Telephone Encounter (Signed)
Discussed with patient. Patient advised 10 mg dose of lisinopril is fine, may need to decrease later when coming down off phentermine. Patient verbalized understanding.

## 2015-03-03 ENCOUNTER — Other Ambulatory Visit: Payer: Self-pay | Admitting: Nurse Practitioner

## 2015-04-30 ENCOUNTER — Encounter: Payer: Self-pay | Admitting: Nurse Practitioner

## 2015-04-30 ENCOUNTER — Ambulatory Visit (INDEPENDENT_AMBULATORY_CARE_PROVIDER_SITE_OTHER): Payer: Medicaid Other | Admitting: Nurse Practitioner

## 2015-04-30 VITALS — BP 128/74 | Ht 67.0 in | Wt 203.0 lb

## 2015-04-30 DIAGNOSIS — M5442 Lumbago with sciatica, left side: Secondary | ICD-10-CM

## 2015-04-30 MED ORDER — NAPROXEN 500 MG PO TABS: 500.0000 mg | ORAL_TABLET | Freq: Two times a day (BID) | ORAL | Status: DC

## 2015-04-30 MED ORDER — GABAPENTIN 300 MG PO CAPS: 300.0000 mg | ORAL_CAPSULE | Freq: Two times a day (BID) | ORAL | Status: DC

## 2015-04-30 MED ORDER — CYCLOBENZAPRINE HCL 10 MG PO TABS: 10.0000 mg | ORAL_TABLET | Freq: Two times a day (BID) | ORAL | Status: DC | PRN

## 2015-04-30 MED ORDER — OXYCODONE-ACETAMINOPHEN 5-325 MG PO TABS: 1.0000 | ORAL_TABLET | Freq: Four times a day (QID) | ORAL | Status: DC | PRN

## 2015-05-03 ENCOUNTER — Encounter: Payer: Self-pay | Admitting: Nurse Practitioner

## 2015-05-03 DIAGNOSIS — M5442 Lumbago with sciatica, left side: Secondary | ICD-10-CM | POA: Insufficient documentation

## 2015-05-03 NOTE — Progress Notes (Signed)
Subjective:  Presents for complaints of constant left low back pain radiating into the left buttock down the leg to the knee, occasionally all the way to the foot. Began over a year ago. Worse lately. Had x-rays in [place] which she says shows an L4-L5 fracture in 2015. Has a history of neck problems but this is much improved. Pain worsened over the past month, denies history of injury. Occurs at least 3-4 times per week. Describes a sharp stabbing pain going from the left buttock to the knee occasionally to the arch of the foot. Worse with prolonged sitting or walking. With leaning describes a burning fiery sensation going down the leg. Averages 6-7 out of 10 pain scale at rest, with activity 10 out of 10. No change in bowel or bladder habits.  Objective:   BP 128/74 mmHg  Ht  (1.702 m)  Wt 203 lb (92.08 kg)  BMI 31.79 kg/m2 NAD. Alert, oriented. Lungs clear. Heart regular rate rhythm. Distinct tenderness noted in the left sacroiliac area into the mid buttock with palpation. SLR negative bilaterally. Reflexes normal limit lower extremities. Gait slow but steady. Can perform range of motion with minimal difficulty.  Assessment:  Problem List Items Addressed This Visit      Other   Left-sided low back pain with left-sided sciatica - Primary   Relevant Medications   cyclobenzaprine (FLEXERIL) 10 MG tablet   oxyCODONE-acetaminophen (PERCOCET/ROXICET) 5-325 MG tablet   naproxen (NAPROSYN) 500 MG tablet      Plan:  Meds ordered this encounter  Medications  . cyclobenzaprine (FLEXERIL) 10 MG tablet    Sig: Take 1 tablet (10 mg total) by mouth 2 (two) times daily as needed for muscle spasms.    Dispense:  30 tablet    Refill:  0    Order Specific Question:  Supervising Provider    Answer:  [name] [[id number]]  . oxyCODONE-acetaminophen (PERCOCET/ROXICET) 5-325 MG tablet    Sig: Take 1 tablet by mouth every 6 (six) hours as needed for severe pain.    Dispense:  20 tablet   Refill:  0    Order Specific Question:  Supervising Provider    Answer:  [name] [[id number]]  . naproxen (NAPROSYN) 500 MG tablet    Sig: Take 1 tablet (500 mg total) by mouth 2 (two) times daily with a meal.    Dispense:  30 tablet    Refill:  0    Order Specific Question:  Supervising Provider    Answer:  [name] [[id number]]  . gabapentin (NEURONTIN) 300 MG capsule    Sig: Take 1 capsule (300 mg total) by mouth 2 (two) times daily.    Dispense:  60 capsule    Refill:  2    Order Specific Question:  Supervising Provider    Answer:  [name] [[id number]]   Continue to work on weight loss. Given copy of back exercises. Ice/heat applications. TENS unit. Cautioned patient about potential drowsiness with oxycodone Xanax and Flexeril. Avoid taking medications at the same time. Call back in 10-14 days if no improvement, sooner if worse.

## 2015-05-17 ENCOUNTER — Ambulatory Visit (INDEPENDENT_AMBULATORY_CARE_PROVIDER_SITE_OTHER): Payer: Medicaid Other | Admitting: Family Medicine

## 2015-05-17 ENCOUNTER — Encounter: Payer: Self-pay | Admitting: Family Medicine

## 2015-05-17 VITALS — BP 124/74 | Temp 98.7°F | Wt 198.5 lb

## 2015-05-17 DIAGNOSIS — G5601 Carpal tunnel syndrome, right upper limb: Secondary | ICD-10-CM | POA: Diagnosis not present

## 2015-05-17 MED ORDER — NAPROXEN 500 MG PO TABS: 500.0000 mg | ORAL_TABLET | Freq: Two times a day (BID) | ORAL | Status: DC

## 2015-05-17 NOTE — Progress Notes (Signed)
   Subjective:    Patient ID: Teresa Vincent, female    DOB: Oct 24, 1980, 35 y.o.   MRN: [id number]  Hand Pain  The incident occurred 3 to 5 days ago. There was no injury mechanism. The pain is present in the right hand and right elbow. The quality of the pain is described as shooting. Associated symptoms include numbness. She has tried ice (Ace Bandage ) for the symptoms.   Patient states no other concerns this visit.  Palm of hand goes numb  srist gets tight   Deep ache into elbow,  Drops and shakes it out  Wakes upat night  Noted swelling in hand last wk,  Cleaning a lot   Review of Systems  Neurological: Positive for numbness.   No headache no chest pain    Objective:   Physical Exam Alert vitals stable. Lungs clear. Heart regular in rhythm H&T normal right hand grip strength sensation all intact. Positive Tinel's sign. Positive Phalen's sign       Assessment & Plan:  Impression classic carpal tunnel syndrome discussed plan anti-inflammatory medicine Naprosyn twice a day. Nocturnal wrist splints expect slow resolution risk factors discussed WSL

## 2015-06-10 ENCOUNTER — Telehealth: Payer: Self-pay | Admitting: Family Medicine

## 2015-06-10 ENCOUNTER — Encounter: Payer: Self-pay | Admitting: Nurse Practitioner

## 2015-06-10 ENCOUNTER — Ambulatory Visit (INDEPENDENT_AMBULATORY_CARE_PROVIDER_SITE_OTHER): Payer: Medicaid Other | Admitting: Nurse Practitioner

## 2015-06-10 VITALS — BP 144/90 | Ht 67.0 in | Wt 197.1 lb

## 2015-06-10 DIAGNOSIS — G5601 Carpal tunnel syndrome, right upper limb: Secondary | ICD-10-CM | POA: Insufficient documentation

## 2015-06-10 DIAGNOSIS — Z6836 Body mass index (BMI) 36.0-36.9, adult: Secondary | ICD-10-CM

## 2015-06-10 DIAGNOSIS — E66812 Obesity, class 2: Secondary | ICD-10-CM | POA: Insufficient documentation

## 2015-06-10 NOTE — Telephone Encounter (Signed)
Message for Teresa Vincent-Patient was seen this morning couldn't remember to diet pill she was on.so was told to call back with the name of diet pill phentermine 37.5 mg. She wants it called into [facility] [place].

## 2015-06-10 NOTE — Patient Instructions (Addendum)
Qsymia Belviq Contrave (not with pain med)

## 2015-06-11 ENCOUNTER — Encounter: Payer: Self-pay | Admitting: Nurse Practitioner

## 2015-06-11 MED ORDER — PHENTERMINE HCL 37.5 MG PO TABS: 37.5000 mg | ORAL_TABLET | Freq: Every day | ORAL | Status: DC

## 2015-06-11 NOTE — Progress Notes (Signed)
Subjective:  Presents for recheck. Has not taken her blood pressure pill yet this morning. Has been off her phentermine for 6-7 days. Doing well with her diet. Denies any side effects from phentermine. At very end of visit patient mentions that her carpal tunnel in her right hand is much worse. Saw Dr. Brett CanalesSteve on 2/27. Now having constant numbness in all the fingers except for the small finger. Wearing her brace mainly at nighttime. No relief with anti-inflammatories. Numbness persist no matter what position her hand is in.  Objective:   BP 144/90 mmHg  Ht 5\' 7"  (1.702 m)  Wt 197 lb 2 oz (89.415 kg)  BMI 30.87 kg/m2 NAD. Alert, oriented. Lungs clear. Heart regular rate rhythm. See previous notes 2/27. Tenderness with ROM of the right wrist. Strong radial pulse. BP on recheck left arm sitting 142/86.  Assessment:  Problem List Items Addressed This Visit      Nervous and Auditory   Carpal tunnel syndrome of right wrist - Primary   Relevant Medications   phentermine (ADIPEX-P) 37.5 MG tablet     Other   Morbid obesity due to excess calories (HCC)   Relevant Medications   phentermine (ADIPEX-P) 37.5 MG tablet     Plan:  Meds ordered this encounter  Medications  . phentermine (ADIPEX-P) 37.5 MG tablet    Sig: Take 1 tablet (37.5 mg total) by mouth daily before breakfast.    Dispense:  30 tablet    Refill:  1    [facility] [place]    Order Specific Question:  Supervising Provider    Answer:  Riccardo DubinLUKING, WILLIAM S [[id number]]   May take phentermine for 2 more months and then DC. Refer to hand specialist for evaluation. Return if symptoms worsen or fail to improve.

## 2015-06-11 NOTE — Telephone Encounter (Signed)
done

## 2015-06-14 ENCOUNTER — Telehealth: Payer: Self-pay | Admitting: Family Medicine

## 2015-06-14 MED ORDER — NABUMETONE 750 MG PO TABS: 750.0000 mg | ORAL_TABLET | Freq: Two times a day (BID) | ORAL | Status: DC

## 2015-06-14 NOTE — Telephone Encounter (Signed)
Wrist splint rx, also stop naprosyn and start relafen 750 bid 42 , no narcotics for cts

## 2015-06-14 NOTE — Telephone Encounter (Signed)
Rx for wrist splint faxed to [facility] Rx for NSAID sent electronically to the pharmacy. Patient notified.

## 2015-06-14 NOTE — Telephone Encounter (Signed)
Patient was seen 3/23 and now needing a longer brace for her wrist.Her current brace is to small and not long enough. Stating her hand is painful and waiting on referral but needing something to stable her hand pain.She wants prescription called into [facility].

## 2015-06-16 ENCOUNTER — Telehealth: Payer: Self-pay | Admitting: *Deleted

## 2015-06-16 MED ORDER — MELOXICAM 15 MG PO TABS: 15.0000 mg | ORAL_TABLET | Freq: Every day | ORAL | Status: DC

## 2015-06-16 NOTE — Telephone Encounter (Signed)
L[facility]C (med sent to pharmacy)  

## 2015-06-16 NOTE — Telephone Encounter (Signed)
Insurance denial for Nabumetone-non preferred- patient must try and fail 2 preferred. Preferred choices: ibuprofen, meloxicam, naproxen

## 2015-06-16 NOTE — Telephone Encounter (Signed)
Informed patient that per Dr.Steve Luking- Meloxicam was sent to pharmacy due to other medication not being preferred. Patient verbalized understanding.

## 2015-06-16 NOTE — Telephone Encounter (Signed)
meloxicam 15 one daily 30 six ref

## 2015-06-16 NOTE — Addendum Note (Signed)
Addended by: Dereck LigasJOHNSON, Kazue Cerro P on: 06/16/2015 11:15 AM   Modules accepted: Orders

## 2015-06-18 ENCOUNTER — Telehealth: Payer: Self-pay | Admitting: Family Medicine

## 2015-06-18 NOTE — Telephone Encounter (Signed)
Message for Seda-patient seen on 3/23 and wanting a referral for her hand as soon as possible.

## 2015-06-21 ENCOUNTER — Other Ambulatory Visit: Payer: Self-pay | Admitting: Nurse Practitioner

## 2015-06-21 DIAGNOSIS — G5601 Carpal tunnel syndrome, right upper limb: Secondary | ICD-10-CM

## 2015-06-21 NOTE — Telephone Encounter (Signed)
Referral sent in

## 2015-06-22 ENCOUNTER — Encounter: Payer: Self-pay | Admitting: Family Medicine

## 2015-06-23 ENCOUNTER — Telehealth: Payer: Self-pay | Admitting: Family Medicine

## 2015-06-23 NOTE — Telephone Encounter (Signed)
Which should we do in your opinion? Order test or refer to different provider?

## 2015-06-23 NOTE — Telephone Encounter (Signed)
Dr. Mina MarbleWeingold reviewed pt's notes, and is requesting that we have pt get a nerve conduction study & forward results to him for review before they can schedule her an appointment [facility] does these tests without needing a consult visit as well)  Please advise

## 2015-06-23 NOTE — Telephone Encounter (Signed)
Honestly it would probably go smoother if we just referred to another practice.  [facility] and other offices probably do to but when referral says hand surgery I use [facility] Please advise

## 2015-06-23 NOTE — Telephone Encounter (Signed)
Lets go with the other choice. Thanks.

## 2015-06-24 ENCOUNTER — Encounter: Payer: Self-pay | Admitting: Family Medicine

## 2015-06-24 NOTE — Telephone Encounter (Signed)
Pt called to check on this, explained that we were referring to a different group, pt state right wrist is much worse & now painful also now having symptoms in left wrist.  [facility], they will contact pt to schedule, this was explained to pt, pt verbalized understanding

## 2015-09-01 ENCOUNTER — Other Ambulatory Visit: Payer: Self-pay | Admitting: "Endocrinology

## 2015-09-01 ENCOUNTER — Ambulatory Visit: Payer: Medicaid Other | Admitting: "Endocrinology

## 2015-09-01 DIAGNOSIS — E039 Hypothyroidism, unspecified: Secondary | ICD-10-CM

## 2015-09-01 LAB — TSH: TSH: 0.27 m[IU]/L — AB

## 2015-09-01 LAB — T4, FREE: FREE T4: 1.5 ng/dL (ref 0.8–1.8)

## 2015-09-02 ENCOUNTER — Other Ambulatory Visit: Payer: Self-pay

## 2015-09-02 MED ORDER — LEVOTHYROXINE SODIUM 175 MCG PO TABS
ORAL_TABLET | ORAL | Status: DC
Start: 2015-09-02 — End: 2015-09-20

## 2015-09-06 ENCOUNTER — Other Ambulatory Visit: Payer: Self-pay | Admitting: "Endocrinology

## 2015-09-06 ENCOUNTER — Other Ambulatory Visit: Payer: Self-pay | Admitting: Nurse Practitioner

## 2015-09-14 ENCOUNTER — Encounter: Payer: Self-pay | Admitting: Family Medicine

## 2015-09-14 ENCOUNTER — Ambulatory Visit (HOSPITAL_COMMUNITY)
Admission: RE | Admit: 2015-09-14 | Discharge: 2015-09-14 | Disposition: A | Discharge status: Home / Self Care | Payer: Medicaid Other | Source: Ambulatory Visit | Attending: Family Medicine | Admitting: Family Medicine

## 2015-09-14 ENCOUNTER — Ambulatory Visit (INDEPENDENT_AMBULATORY_CARE_PROVIDER_SITE_OTHER): Payer: Medicaid Other | Admitting: Family Medicine

## 2015-09-14 VITALS — Ht 67.0 in | Wt 197.0 lb

## 2015-09-14 DIAGNOSIS — M25562 Pain in left knee: Secondary | ICD-10-CM | POA: Diagnosis not present

## 2015-09-14 MED ORDER — GABAPENTIN 300 MG PO CAPS: 300.0000 mg | ORAL_CAPSULE | Freq: Three times a day (TID) | ORAL | Status: DC

## 2015-09-14 MED ORDER — PREDNISONE 20 MG PO TABS
ORAL_TABLET | ORAL | Status: DC
Start: 2015-09-14 — End: 2015-09-26

## 2015-09-14 NOTE — Progress Notes (Signed)
   Subjective:    Patient ID: Teresa Vincent, female    DOB: 11/25/1980, 35 y.o.   MRN: [id number]  HPI Patient arrives with c/o left knee pain off and on for about a month  Worse with certain motions Sharp jerking pains  Deep ache  No major injury early on, no marks or swelling   Review of Systems No headache, no major weight loss or weight gain, no chest pain no back pain abdominal pain no change in bowel habits complete ROS otherwise negative     Objective:   Physical Exam Alert vital stable lungs clear heart regular in rhythm knees left knee positive anterior and posterior pain to palpation no obvious joint laxity no obvious effusion       Assessment & Plan:  Impression subacute flare left knee pain plan x-ray knee. Prednisone taper. Local measures discussed. If persists will need to see orthopedic surgeon

## 2015-09-20 ENCOUNTER — Encounter: Payer: Self-pay | Admitting: "Endocrinology

## 2015-09-20 ENCOUNTER — Ambulatory Visit (INDEPENDENT_AMBULATORY_CARE_PROVIDER_SITE_OTHER): Payer: Medicaid Other | Admitting: "Endocrinology

## 2015-09-20 VITALS — BP 123/86 | HR 72 | Ht 67.0 in | Wt 207.0 lb

## 2015-09-20 DIAGNOSIS — I10 Essential (primary) hypertension: Secondary | ICD-10-CM

## 2015-09-20 DIAGNOSIS — E890 Postprocedural hypothyroidism: Secondary | ICD-10-CM | POA: Diagnosis not present

## 2015-09-20 MED ORDER — LEVOTHYROXINE SODIUM 175 MCG PO TABS: ORAL_TABLET | ORAL | Status: DC

## 2015-09-20 NOTE — Progress Notes (Signed)
Subjective:    Patient ID: Teresa Vincent, female    DOB: 08/16/1980, PCP Teresa SouthSteve Luking, MD   Past Medical History  Diagnosis Date  . ANXIETY 08/30/2007  . GRAVES' DISEASE 12/07/2008  . 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 INSOMNIA 08/30/2007  . MIGRAINE HEADACHE 08/30/2007  . Neck pain    Past Surgical History  Procedure Laterality Date  . Tubal ligation  2003   Social History   Social History  . Marital Status: Divorced    Spouse Name: N/A  . Number of Children: N/A  . Years of Education: N/A   Occupational History  . Doesn't work outside the home    Social History Main Topics  . Smoking status: Current Every Day Smoker -- 1.00 packs/day for 26 years    Types: Cigarettes    Last Attempt to Quit: 06/29/2012  . Smokeless tobacco: Never Used  . Alcohol Use: 0.0 oz/week    0 Standard drinks or equivalent per week     Comment: occasional  . Drug Use: No  . Sexual Activity: Not Asked   Other Topics Concern  . None   Social History Narrative   Outpatient Encounter Prescriptions as of 09/20/2015  Medication Sig  . albuterol (PROVENTIL HFA;VENTOLIN HFA) 108 (90 BASE) MCG/ACT inhaler Inhale 1-2 puffs into the lungs every 6 (six) hours as needed for wheezing or shortness of breath.  . ALPRAZolam (XANAX) 0.5 MG tablet TAKE ONE TABLET BY MOUTH AT BEDTIME AS NEEDED FOR SLEEP  . cyclobenzaprine (FLEXERIL) 10 MG tablet Take 1 tablet (10 mg total) by mouth 2 (two) times daily as needed for muscle spasms.  . diazepam (VALIUM) 5 MG tablet Take 5 mg by mouth at bedtime as needed for anxiety. Reported on 06/10/2015  . gabapentin (NEURONTIN) 300 MG capsule Take 1 capsule (300 mg total) by mouth 3 (three) times daily.  Marland Kitchen. levothyroxine (SYNTHROID, LEVOTHROID) 175 MCG tablet TAKE ONE TABLET BY MOUTH ONCE DAILY BEFORE BREAKFAST  . lisinopril (PRINIVIL,ZESTRIL) 10 MG tablet TAKE ONE TABLET BY MOUTH ONCE DAILY  . meclizine (ANTIVERT) 25 MG tablet Take one every 6 hours for  dizziness  . meloxicam (MOBIC) 15 MG tablet Take 1 tablet (15 mg total) by mouth daily.  . nabumetone (RELAFEN) 750 MG tablet Take 1 tablet (750 mg total) by mouth 2 (two) times daily.  . naproxen (NAPROSYN) 500 MG tablet Take 1 tablet (500 mg total) by mouth 2 (two) times daily with a meal.  . oxyCODONE-acetaminophen (PERCOCET/ROXICET) 5-325 MG tablet Take 1 tablet by mouth every 6 (six) hours as needed for severe pain.  . phentermine (ADIPEX-P) 37.5 MG tablet Take 1 tablet (37.5 mg total) by mouth daily before breakfast.  . predniSONE (DELTASONE) 20 MG tablet 3qd for 3d then 2qd for 3d then 1qd for 2d   No facility-administered encounter medications on file as of 09/20/2015.   ALLERGIES: Allergies  Allergen Reactions  . Adhesive [Tape] Other (See Comments)    Skin irritation, patient prefers paper tape.   Budd Palmer. Celexa [Citalopram Hydrobromide] Other (See Comments)    Make patient too carefree  . Latex Itching and Swelling  . Phenergan [Promethazine Hcl] Nausea And Vomiting   VACCINATION STATUS: Immunization History  Administered Date(s) Administered  . Td 05/11/2006    HPI  Teresa Vincent is a 35 year old female patient with medical history as above. She is here to follow-up for hypothyroidism. Her history is significant for treatment for Graves' disease with RAI on 01/06/2009. She was treated  with various doses of levothyroxine over the years currently at 175 g by mouth every morning. She has been more consistent in taking her thyroid hormone. She has no new complaints.  - she lost approx 20 lbs overall.  She was sent for repeat thyroid function tests. She denies any family history of thyroid dysfunction. She denies any personal history of goiter nor family history of thyroid cancer. She denies heat or cold intolerance, palpitations, tremors.  Review of Systems  Constitutional: + weight Loss of 20 pounds,  + fatigue, no subjective hyperthermia/hypothermia Eyes: no blurry vision, no  xerophthalmia ENT: no sore throat, no nodules palpated in throat, no dysphagia/odynophagia, no hoarseness Cardiovascular: no CP/SOB/palpitations/leg swelling Respiratory: no cough/SOB Gastrointestinal: no N/V/D/C Musculoskeletal: no muscle/joint aches Skin: no rashes Neurological: no tremors/numbness/tingling/dizziness Psychiatric: no depression/anxiety  Objective:    BP 123/86 mmHg  Pulse 72  Ht 5\' 7"  (1.702 m)  Wt 207 lb (93.895 kg)  BMI 32.41 kg/m2  LMP 09/06/2015  Wt Readings from Last 3 Encounters:  09/20/15 207 lb (93.895 kg)  09/14/15 197 lb (89.359 kg)  06/10/15 197 lb 2 oz (89.415 kg)    Physical Exam  Constitutional: overweight, in NAD Eyes: PERRLA, EOMI, no exophthalmos ENT: moist mucous membranes, no thyromegaly, no cervical lymphadenopathy Cardiovascular: RRR, No MRG Respiratory: CTA B Gastrointestinal: abdomen soft, NT, ND, BS+ Musculoskeletal: no deformities, strength intact in all 4 Skin: moist, warm, no rashes Neurological: no tremor with outstretched hands, DTR normal in all 4   Complete Blood Count (Most recent): Lab Results  Component Value Date   WBC 7.4 09/08/2014   HGB 13.3 09/08/2014   HCT 40.1 09/08/2014   MCV 91.6 09/08/2014   PLT 219 09/08/2014   Chemistry (most recent): Lab Results  Component Value Date   NA 138 09/08/2014   K 3.6 09/08/2014   CL 106 09/08/2014   CO2 27 09/08/2014   BUN 13 09/08/2014   CREATININE 1.07* 09/08/2014   Recent Results (from the past 2160 hour(s))  TSH     Status: Abnormal   Collection Time: 09/01/15  8:21 AM  Result Value Ref Range   TSH 0.27 (L) mIU/L    Comment:   Reference Range   > or = 20 Years  0.40-4.50   Pregnancy Range First trimester  0.26-2.66 Second trimester 0.55-2.73 Third trimester  0.43-2.91     T4, free     Status: None   Collection Time: 09/01/15  8:49 AM  Result Value Ref Range   Free T4 1.5 0.8 - 1.8 ng/dL    Assessment & Plan:   1. 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 I will continue her Levothyroxine  175 g by mouth every morning.  - We discussed about correct intake of levothyroxine, at fasting, with water, separated by at least 30 minutes from breakfast, and separated by more than 4 hours from calcium, iron, multivitamins, acid reflux medications (PPIs). -Patient is made aware of the fact that thyroid hormone replacement is needed for life, dose to be adjusted by periodic monitoring of thyroid function tests. -For weight gain, I have discussed a diet program higher in protein and lower in carbohydrates coupled with regular exercise combining aerobics, strength, and a stretch components.  2.hypertension: Uncontrolled I advised her to continue  Lisinopril 10 mg by mouth daily.  - I advised patient to maintain close follow up with Teresa SouthSteve Luking, MD for primary care needs. Follow up plan: Return  in about 6 months (around 03/22/2016) for follow up with pre-visit labs.  Marquis Lunch, MD Phone: [phone]  Fax: [phone]   09/20/2015, 9:22 AM

## 2015-09-25 ENCOUNTER — Emergency Department (HOSPITAL_COMMUNITY)
Admission: EM | Admit: 2015-09-25 | Discharge: 2015-09-26 | Disposition: A | Discharge status: Home / Self Care | Payer: Medicaid Other | Attending: Emergency Medicine | Admitting: Emergency Medicine

## 2015-09-25 ENCOUNTER — Encounter (HOSPITAL_COMMUNITY): Payer: Self-pay | Admitting: Emergency Medicine

## 2015-09-25 DIAGNOSIS — E039 Hypothyroidism, unspecified: Secondary | ICD-10-CM | POA: Insufficient documentation

## 2015-09-25 DIAGNOSIS — Z8673 Personal history of transient ischemic attack (TIA), and cerebral infarction without residual deficits: Secondary | ICD-10-CM | POA: Insufficient documentation

## 2015-09-25 DIAGNOSIS — B029 Zoster without complications: Secondary | ICD-10-CM

## 2015-09-25 DIAGNOSIS — F17210 Nicotine dependence, cigarettes, uncomplicated: Secondary | ICD-10-CM | POA: Insufficient documentation

## 2015-09-25 DIAGNOSIS — R21 Rash and other nonspecific skin eruption: Secondary | ICD-10-CM | POA: Diagnosis present

## 2015-09-25 HISTORY — DX: Cerebral infarction, unspecified: I63.9

## 2015-09-25 NOTE — ED Provider Notes (Signed)
CSN: [id number]     Arrival date & time 09/25/15  2225 History  By signing my name below, I, [name], attest that this documentation has been prepared under the direction and in the presence of Devoria Albe, MD at 23:55 PM . Electronically Signed: [name], Scribe. 09/26/2015. 12:16 AM.   Chief Complaint  Patient presents with  . Rash   The history is provided by the patient. No language interpreter was used.   HPI Comments: Teresa Vincent is a 35 y.o. female who presents to the Emergency Department complaining of pruritic, "stabbing" pain that began 3 days ago and worsened today. Pt states she began to accumulate a rash on her left thigh that looked like mosquito bites but then became painful and felt like "razor blades." She reports her rash now has a red and "welpy" appearance.She also notes "aching," lower back, left hip, leg, and groin pain that began 3 days ago. Pt reports the pain near her left lateral thigh feels as if someone is "sticking her with a safety pin." Pt states her skin is tender to anything it comes in contact with; pt is uncomfortable now in the ED.  Pt notes she has been more stressed than usual due to recent carpal tunnel surgery in her bilateral hands. Pt denies being close with people who recently had chickenpox or the vaccine; though, she reports a hx of chickenpox when she was 35 years old.  She has never had shingles before.  She states hx of smoking.   PCP Dr. Gerda Diss   Past Medical History  Diagnosis Date  . ANXIETY 08/30/2007  . GRAVES' DISEASE 12/07/2008  . 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 INSOMNIA 08/30/2007  . MIGRAINE HEADACHE 08/30/2007  . Neck pain   . Stroke [facility])    Past Surgical History  Procedure Laterality Date  . Tubal ligation  2003   Family History  Problem Relation Age of Onset  . Thyroid disease Neg Hx   . Hypertension Mother    Social History  Substance Use Topics  . Smoking status: Current Every Day Smoker -- 1.00  packs/day for 26 years    Types: Cigarettes    Last Attempt to Quit: 06/29/2012  . Smokeless tobacco: Never Used  . 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  Skin: Positive for rash.  Neurological: Positive for headaches.  All other systems reviewed and are negative.  Allergies  Adhesive; Celexa; Latex; and Phenergan  Home Medications   Prior to Admission medications   Medication Sig Start Date End Date Taking? Authorizing Provider  acyclovir (ZOVIRAX) 800 MG tablet Take 1 tablet (800 mg total) by mouth 3 (three) times daily. 09/26/15   Devoria Albe, MD  albuterol (PROVENTIL HFA;VENTOLIN HFA) 108 (90 BASE) MCG/ACT inhaler Inhale 1-2 puffs into the lungs every 6 (six) hours as needed for wheezing or shortness of breath. 02/14/15   Elson Areas, PA-C  ALPRAZolam Prudy Feeler) 0.5 MG tablet TAKE ONE TABLET BY MOUTH AT BEDTIME AS NEEDED FOR SLEEP 09/06/15   Campbell Riches, NP  cyclobenzaprine (FLEXERIL) 10 MG tablet Take 1 tablet (10 mg total)  by mouth 2 (two) times daily as needed for muscle spasms. 04/30/15   Campbell Riches, NP  diazepam (VALIUM) 5 MG tablet Take 5 mg by mouth at bedtime as needed for anxiety. Reported on 06/10/2015    Historical Provider, MD  gabapentin (NEURONTIN) 300 MG capsule Take 1 capsule (300 mg total) by mouth 3 (three) times daily. 09/14/15   [name], MD  levothyroxine (SYNTHROID, LEVOTHROID) 175 MCG tablet TAKE ONE TABLET BY MOUTH ONCE DAILY BEFORE BREAKFAST 09/20/15   Roma Kayser, MD  lisinopril (PRINIVIL,ZESTRIL) 10 MG tablet TAKE ONE TABLET BY MOUTH ONCE DAILY 09/06/15   Roma Kayser, MD  meclizine (ANTIVERT) 25 MG tablet Take one every 6 hours for dizziness 06/18/14   Bethann Berkshire, MD  meloxicam (MOBIC) 15 MG tablet Take 1 tablet (15 mg total) by mouth daily. 06/16/15   [name], MD  nabumetone (RELAFEN) 750 MG tablet Take 1 tablet (750 mg total) by mouth 2 (two) times daily. 06/14/15   [name], MD  naproxen (NAPROSYN) 500 MG tablet Take 1 tablet (500 mg total) by mouth 2 (two) times daily with a meal. 05/17/15   [name], MD  oxyCODONE-acetaminophen (PERCOCET/ROXICET) 5-325 MG tablet Take 1 tablet by mouth every 6 (six) hours as needed for severe pain. 09/26/15   Devoria Albe, MD  phentermine (ADIPEX-P) 37.5 MG tablet Take 1 tablet (37.5 mg total) by mouth daily before breakfast. 06/11/15   Campbell Riches, NP  predniSONE (DELTASONE) 20 MG tablet Take 3 po QD x 3d , then 2 po QD x 3d then 1 po QD x 3d 09/26/15   Devoria Albe, MD   BP 141/82 mmHg  Pulse 91  Temp(Src) 98.4 F (36.9 C) (Oral)  Resp 18  Ht  (1.702 m)  Wt 198 lb (89.812 kg)  BMI 31.00 kg/m2  SpO2 100%  LMP 09/15/2015  Vital signs normal   Physical Exam  Constitutional: She is oriented to person, place, and time. She appears well-developed and well-nourished.  Non-toxic appearance. She does not appear ill. No distress.  HENT:  Head: Normocephalic and atraumatic.  Right Ear: External ear normal.  Left Ear: External ear normal.  Nose: Nose normal. No mucosal edema or rhinorrhea.  Mouth/Throat: Mucous membranes are normal. No dental abscesses or uvula swelling.  Eyes: Conjunctivae and EOM are normal.  Neck: Normal range of motion and full passive range of motion without pain.  Cardiovascular: Normal rate.   Pulmonary/Chest: Effort normal. No respiratory distress. She has no rhonchi. She exhibits no crepitus.  Abdominal: Normal appearance.  Musculoskeletal: Normal range of motion. She exhibits no edema.  Moves all extremities well.   Neurological: She is alert and oriented to person, place, and time. She has normal strength. No cranial nerve deficit.  Skin: Skin is intact.  Rash noted. There is erythema. No pallor.  Left proximal lateral thigh/pelvic area: she has clustering of red raised lesions consistent with early shingles   Psychiatric: She has a normal mood and affect. Her speech is normal and behavior is normal. Her mood appears not anxious.  Nursing note and vitals reviewed.      ED Course  Procedures   Medications  ketorolac (TORADOL) injection 60 mg (not administered)  acetaminophen (TYLENOL) tablet 1,000 mg (not administered)  acyclovir (ZOVIRAX) tablet 800 mg (not administered)  predniSONE (DELTASONE) tablet 60 mg (not administered)    DIAGNOSTIC STUDIES:  Oxygen Saturation is 100% on RA, normal by my  interpretation.    COORDINATION OF CARE:  12:03 AM Discussed treatment plan, which includes Antiviral, steroids, and pain medication with pt at bedside and pt agreed to plan. However patient drove herself to the ED so I will be limited in what I can give her for pain and let her drive home. He was given Toradol IM with acetaminophen orally.  Patient presents with pain in her left lateral back that radiates down into her left proximal thigh and into her inner thigh consistent with the distribution of about L2 with a rash consistent with early shingles. Patient is not diabetic or immunocompromised. She was started on antiviral, steroids, and pain medication.  Review of the [place] database shows patient gets alprazolam on a regular basis and phentermine. She has had 3 narcotic prescriptions in the past 6 months with #20 oxycodone 5/325 on February 10, #30 oxycodone 5/325 on May 22 and #30 oxycodone 5/325 on June 18. The last 2 prescriptions were written by her orthopedist who did her carpal tunnel surgery.   MDM   Final diagnoses:  Shingles    New Prescriptions   ACYCLOVIR (ZOVIRAX) 800 MG TABLET    Take 1 tablet (800 mg total) by mouth 3 (three) times daily.   OXYCODONE-ACETAMINOPHEN (PERCOCET/ROXICET) 5-325 MG TABLET    Take 1  tablet by mouth every 6 (six) hours as needed for severe pain.   PREDNISONE (DELTASONE) 20 MG TABLET    Take 3 po QD x 3d , then 2 po QD x 3d then 1 po QD x 3d    Plan discharge  Devoria AlbeIva Kiasia Chou, MD, [facility]FACEP    Alessandro Griep, MD 09/26/15 [phone]

## 2015-09-25 NOTE — ED Notes (Signed)
Patient states she thinks she has shingles. Patient has rash with blisters on left hip that started 3 days ago. Patient states pain is burning and tinging.

## 2015-09-26 MED ORDER — ACYCLOVIR 800 MG PO TABS
800.0000 mg | ORAL_TABLET | Freq: Once | ORAL | Status: AC
  Administered 2015-09-26: 800 mg via ORAL
  Filled 2015-09-26: qty 1

## 2015-09-26 MED ORDER — ACETAMINOPHEN 500 MG PO TABS
1000.0000 mg | ORAL_TABLET | Freq: Once | ORAL | Status: AC
  Administered 2015-09-26: 1000 mg via ORAL
  Filled 2015-09-26: qty 2

## 2015-09-26 MED ORDER — OXYCODONE-ACETAMINOPHEN 5-325 MG PO TABS: 1.0000 | ORAL_TABLET | Freq: Four times a day (QID) | ORAL | Status: DC | PRN

## 2015-09-26 MED ORDER — PREDNISONE 50 MG PO TABS
60.0000 mg | ORAL_TABLET | Freq: Once | ORAL | Status: AC
  Administered 2015-09-26: 60 mg via ORAL
  Filled 2015-09-26: qty 1

## 2015-09-26 MED ORDER — PREDNISONE 20 MG PO TABS: ORAL_TABLET | ORAL | Status: DC

## 2015-09-26 MED ORDER — ACYCLOVIR 800 MG PO TABS
800.0000 mg | ORAL_TABLET | Freq: Three times a day (TID) | ORAL | Status: DC
Start: 2015-09-26 — End: 2015-11-04

## 2015-09-26 MED ORDER — KETOROLAC TROMETHAMINE 60 MG/2ML IM SOLN
60.0000 mg | Freq: Once | INTRAMUSCULAR | Status: AC
  Administered 2015-09-26: 60 mg via INTRAMUSCULAR
  Filled 2015-09-26: qty 2

## 2015-09-26 NOTE — Discharge Instructions (Signed)
Take the medications as prescribed. Stay away from pregnant women, immunocompromised patient such as cancer patients getting chemotherapy radiation treatment, and young children who have not been vaccinated. You will need to see Dr Gerda Diss if you need more pain medication.    Shingles Shingles, which is also known as herpes zoster, is an infection that causes a painful skin rash and fluid-filled blisters. Shingles is not related to genital herpes, which is a sexually transmitted infection.   Shingles only develops in people who:  Have had chickenpox.  Have received the chickenpox vaccine. (This is rare.) CAUSES Shingles is caused by varicella-zoster virus (VZV). This is the same virus that causes chickenpox. After exposure to VZV, the virus stays in the body in an inactive (dormant) state. Shingles develops if the virus reactivates. This can happen many years after the initial exposure to VZV. It is not known what causes this virus to reactivate. RISK FACTORS People who have had chickenpox or received the chickenpox vaccine are at risk for shingles. Infection is more common in people who:  Are older than age 29.  Have a weakened defense (immune) system, such as those with HIV, AIDS, or cancer.  Are taking medicines that weaken the immune system, such as transplant medicines.  Are under great stress. SYMPTOMS Early symptoms of this condition include itching, tingling, and pain in an area on your skin. Pain may be described as burning, stabbing, or throbbing. A few days or weeks after symptoms start, a painful red rash appears, usually on one side of the body in a bandlike or beltlike pattern. The rash eventually turns into fluid-filled blisters that break open, scab over, and dry up in about 2-3 weeks. At any time during the infection, you may also develop:  A fever.  Chills.  A headache.  An upset stomach. DIAGNOSIS This condition is diagnosed with a skin exam. Sometimes, skin or  fluid samples are taken from the blisters before a diagnosis is made. These samples are examined under a microscope or sent to a lab for testing. TREATMENT There is no specific cure for this condition. Your health care provider will probably prescribe medicines to help you manage pain, recover more quickly, and avoid long-term problems. Medicines may include:  Antiviral drugs.  Anti-inflammatory drugs.  Pain medicines. If the area involved is on your face, you may be referred to a specialist, such as an eye doctor (ophthalmologist) or an ear, nose, and throat (ENT) doctor to help you avoid eye problems, chronic pain, or disability. HOME CARE INSTRUCTIONS Medicines  Take medicines only as directed by your health care provider.  Apply an anti-itch or numbing cream to the affected area as directed by your health care provider. Blister and Rash Care  Take a cool bath or apply cool compresses to the area of the rash or blisters as directed by your health care provider. This may help with pain and itching.  Keep your rash covered with a loose bandage (dressing). Wear loose-fitting clothing to help ease the pain of material rubbing against the rash.  Keep your rash and blisters clean with mild soap and cool water or as directed by your health care provider.  Check your rash every day for signs of infection. These include redness, swelling, and pain that lasts or increases.  Do not pick your blisters.  Do not scratch your rash. General Instructions  Rest as directed by your health care provider.  Keep all follow-up visits as directed by your health care provider.  This is important.  Until your blisters scab over, your infection can cause chickenpox in people who have never had it or been vaccinated against it. To prevent this from happening, avoid contact with other people, especially:  Babies.  Pregnant women.  Children who have eczema.  Elderly people who have  transplants.  People who have chronic illnesses, such as leukemia or AIDS. SEEK MEDICAL CARE IF:  Your pain is not relieved with prescribed medicines.  Your pain does not get better after the rash heals.  Your rash looks infected. Signs of infection include redness, swelling, and pain that lasts or increases. SEEK IMMEDIATE MEDICAL CARE IF:  The rash is on your face or nose.  You have facial pain, pain around your eye area, or loss of feeling on one side of your face.  You have ear pain or you have ringing in your ear.  You have loss of taste.  Your condition gets worse.   This information is not intended to replace advice given to you by your health care provider. Make sure you discuss any questions you have with your health care provider.   Document Released: 03/06/2005 Document Revised: 03/27/2014 Document Reviewed: 01/15/2014 Elsevier Interactive Patient Education [employer]2016 Elsevier Inc.

## 2015-09-27 ENCOUNTER — Encounter: Payer: Self-pay | Admitting: Family Medicine

## 2015-09-27 ENCOUNTER — Ambulatory Visit (INDEPENDENT_AMBULATORY_CARE_PROVIDER_SITE_OTHER): Payer: Medicaid Other | Admitting: Family Medicine

## 2015-09-27 VITALS — BP 116/80 | Temp 98.3°F | Ht 67.0 in | Wt 212.1 lb

## 2015-09-27 DIAGNOSIS — R21 Rash and other nonspecific skin eruption: Secondary | ICD-10-CM

## 2015-09-27 NOTE — Progress Notes (Signed)
   Subjective:    Patient ID: Teresa Vincent, female    DOB: 01/01/1981, 35 y.o.   MRN: [id number]  HPI Patient is here today for ER follow up visit. [facility] for shingles. Patient states that it is still painful to walk.  Patient has no other concerns at this time.    Rash substantially painful. No history of prior rash similar. Positive chickenpox history is young child  Review of Systems No fever no chills no vomiting no diarrhea    Objective:   Physical Exam   Alert vital stable lungs clear heart rare rhythm left flank fascicular erythematous patchy rash     Assessment & Plan:  Impression shingles numerous questions answered plan 15 minutes spent most with discussion. Symptom care discussed may need refill for pain medicine: To see if needs by Thursday

## 2015-09-30 ENCOUNTER — Other Ambulatory Visit: Payer: Self-pay | Admitting: *Deleted

## 2015-09-30 ENCOUNTER — Telehealth: Payer: Self-pay | Admitting: Family Medicine

## 2015-09-30 MED ORDER — OXYCODONE-ACETAMINOPHEN 5-325 MG PO TABS: 1.0000 | ORAL_TABLET | Freq: Three times a day (TID) | ORAL | Status: DC | PRN

## 2015-09-30 NOTE — Telephone Encounter (Signed)
Seen 7/8 at ED for shingles and seen here on 7/10. Pt got oxycodone #20 from ER doctor

## 2015-09-30 NOTE — Telephone Encounter (Signed)
Left message to return call 

## 2015-09-30 NOTE — Telephone Encounter (Signed)
Pt is requesting a refill on her pain medication  

## 2015-09-30 NOTE — Telephone Encounter (Signed)
Discussed with pt. Script ready for pickup and no further refills.

## 2015-09-30 NOTE — Telephone Encounter (Signed)
May repeat twenty stretch out o one three times per day as needed and no further meds for this flare

## 2015-10-07 ENCOUNTER — Other Ambulatory Visit: Payer: Self-pay | Admitting: Family Medicine

## 2015-10-14 ENCOUNTER — Telehealth: Payer: Self-pay | Admitting: Nurse Practitioner

## 2015-10-14 NOTE — Telephone Encounter (Signed)
Also try capsaicin cream; pepper cream is known to help pain; just apply to affected area once it is healed

## 2015-10-14 NOTE — Telephone Encounter (Signed)
We generally avoid prescribing narcotic pain med because this pain can last for weeks or even months. I recommend she consider gabapentin if she is not already on something like this. This med targets nerve pain. Once it goes away she can wean off med. Let me know what she decides.

## 2015-10-14 NOTE — Telephone Encounter (Signed)
Saw where she is on Gabapentin. We can increase dose if needed. Also recommend OTC Lidocaine patches.

## 2015-10-14 NOTE — Telephone Encounter (Signed)
Patient was diagnosed with shingles a couple of weeks back at the [facility] ER, then followed up here on 09/27/15.  She said the shingles are gone, but pain in her hip has not went away at all.  She describes it as razor blades cutting into her side.  She wants to know if we can give her Rx for pain medication or a very strong numbing cream.  Please advise.   [facility] [place]

## 2015-10-15 NOTE — Telephone Encounter (Signed)
Tried to call no answer 10/15/15

## 2015-10-18 ENCOUNTER — Other Ambulatory Visit: Payer: Self-pay | Admitting: Nurse Practitioner

## 2015-10-18 MED ORDER — GABAPENTIN 300 MG PO CAPS: ORAL_CAPSULE | ORAL | 2 refills | Status: DC

## 2015-10-18 NOTE — Telephone Encounter (Addendum)
Discussed with patient that Teresa Vincent does not recommend narcotics- may try OTC lidocaine patches or capsaicin cream for nerve pain. Patient verbalized understanding. Patient would also like to increase gabapentin-currently on 300mg  tid

## 2015-10-18 NOTE — Telephone Encounter (Signed)
I sent in a new Rx for 1 am, 1 afternoon and 2 at night; if her pain from shingles resolves, go back to previous dose

## 2015-10-19 NOTE — Telephone Encounter (Signed)
Patient was advised that Neile recommends  a new Rx for 1 am, 1 afternoon and 2 at night; if her pain from shingles resolves, go back to previous dose Patient verbalized understanding

## 2015-11-04 ENCOUNTER — Encounter: Payer: Self-pay | Admitting: Family Medicine

## 2015-11-04 ENCOUNTER — Telehealth: Payer: Self-pay | Admitting: Family Medicine

## 2015-11-04 ENCOUNTER — Ambulatory Visit (INDEPENDENT_AMBULATORY_CARE_PROVIDER_SITE_OTHER): Payer: Medicaid Other | Admitting: Family Medicine

## 2015-11-04 VITALS — Temp 99.0°F | Ht 67.0 in | Wt 207.0 lb

## 2015-11-04 DIAGNOSIS — M792 Neuralgia and neuritis, unspecified: Secondary | ICD-10-CM

## 2015-11-04 MED ORDER — OXYCODONE-ACETAMINOPHEN 5-325 MG PO TABS: 1.0000 | ORAL_TABLET | Freq: Three times a day (TID) | ORAL | 0 refills | Status: DC | PRN

## 2015-11-04 MED ORDER — VALACYCLOVIR HCL 1 G PO TABS: 1000.0000 mg | ORAL_TABLET | Freq: Three times a day (TID) | ORAL | 0 refills | Status: DC

## 2015-11-04 NOTE — Progress Notes (Signed)
   Subjective:    Patient ID: Lenard LanceCarolyn J Fera, female    DOB: 03/16/1981, 35 y.o.   MRN: [id number]  Leg Pain   Incident onset: today. The pain is present in the left thigh.   Patient had shingles approximately a month ago she was treated with Valtrex pain medicine it took a while to get better but it finally got better she did have burning and discomfort she denies any other particular troubles. She did have to take antiviral also oxycodone and gabapentin for a few days   Review of Systems     Objective:   Physical Exam Exceptionally sensitive skin underneath the left side no redness no swelling noted. Negative straight leg       Assessment & Plan:  It is possible that this could be shingles again but is also equally possible this is neuropathic pain related to the previous shingles  Valtrex 3 times a day 7 days Oxycodone severe pain caution drowsiness #20 no refills  If ongoing troubles consider reinitiating gabapentin

## 2015-11-04 NOTE — Telephone Encounter (Signed)
ERROR

## 2015-12-05 ENCOUNTER — Encounter (HOSPITAL_COMMUNITY): Payer: Self-pay | Admitting: Emergency Medicine

## 2015-12-05 ENCOUNTER — Emergency Department (HOSPITAL_COMMUNITY)
Admission: EM | Admit: 2015-12-05 | Discharge: 2015-12-05 | Disposition: A | Discharge status: Home / Self Care | Payer: Medicaid Other | Attending: Emergency Medicine | Admitting: Emergency Medicine

## 2015-12-05 DIAGNOSIS — M5441 Lumbago with sciatica, right side: Secondary | ICD-10-CM | POA: Insufficient documentation

## 2015-12-05 DIAGNOSIS — Z79899 Other long term (current) drug therapy: Secondary | ICD-10-CM | POA: Insufficient documentation

## 2015-12-05 DIAGNOSIS — I10 Essential (primary) hypertension: Secondary | ICD-10-CM | POA: Diagnosis not present

## 2015-12-05 DIAGNOSIS — M5442 Lumbago with sciatica, left side: Secondary | ICD-10-CM | POA: Diagnosis not present

## 2015-12-05 DIAGNOSIS — F17210 Nicotine dependence, cigarettes, uncomplicated: Secondary | ICD-10-CM | POA: Insufficient documentation

## 2015-12-05 DIAGNOSIS — M545 Low back pain: Secondary | ICD-10-CM | POA: Diagnosis present

## 2015-12-05 DIAGNOSIS — E039 Hypothyroidism, unspecified: Secondary | ICD-10-CM | POA: Diagnosis not present

## 2015-12-05 DIAGNOSIS — M5432 Sciatica, left side: Secondary | ICD-10-CM

## 2015-12-05 DIAGNOSIS — M5431 Sciatica, right side: Secondary | ICD-10-CM

## 2015-12-05 DIAGNOSIS — Z7951 Long term (current) use of inhaled steroids: Secondary | ICD-10-CM | POA: Diagnosis not present

## 2015-12-05 MED ORDER — CYCLOBENZAPRINE HCL 5 MG PO TABS: 5.0000 mg | ORAL_TABLET | Freq: Two times a day (BID) | ORAL | 0 refills | Status: DC | PRN

## 2015-12-05 MED ORDER — OXYCODONE-ACETAMINOPHEN 5-325 MG PO TABS: 1.0000 | ORAL_TABLET | ORAL | 0 refills | Status: DC | PRN

## 2015-12-05 NOTE — ED Triage Notes (Signed)
Patient c/o lower back pain that radiates into hips and down legs bilaterally. Per patient has had pain since 2015, patient states diagnosed with "broken back with 3 ? buldging/slipped disc and sciatica ." Patient states that she has been taking Percocet and gabapentin with no relief. Denies any problems with urination or bowel movements. CNS intact.

## 2015-12-05 NOTE — ED Provider Notes (Signed)
[facility]-EM[facility]Y DEPT Provider Note   CSN: [id number] Arrival date & time: 12/05/15  1344  By signing my name below, I, Soijett Blue, attest that this documentation has been prepared under the direction and in the presence of Burgess Amor, PA-C Electronically Signed: Soijett Blue, ED Scribe. 12/05/15. 4:31 PM.    History   Chief Complaint Chief Complaint  Patient presents with  . Back Pain    HPI  Teresa Vincent is a 35 y.o. female with a PMHx of chronic lower back pain who presents to the Emergency Department complaining of 7/10, intermittent, lower back pain onset 2 years worsening 2 days ago. Pt reports that her back pain radiates to her bilateral posterior ankles and bilateral anterior thighs. Pt notes that she was seen at [facility] ED in 2015 and evaluated for her lower back pain and informed that she "broke the lower portion of her back". Pt states that she was seen by her PCP in 2016 for her lower back pain and was Rx 5 mg percocet (which she breaks in 1/2). She was seen by her PCP 2 months ago and was Rx gabapentin for her increasing lower back pain which has made no difference, stating flexeril has worked better in the past.  She ran out of her percocet 2 weeks ago, is still taking the gabapentin. Pt lower back pain is worsened with position change, laying down, and standing. Pt denies any alleviating factors.She has tried warm soaks/compresses, massages, with no relief of her symptoms.  Pt denies bowel/bladder incontinence, abdominal pain,  rash, wound, and any other symptoms. Pt PCP is Sherie Don, NP.    The history is provided by the patient. No language interpreter was used.    Past Medical History:  Diagnosis Date  . ANXIETY 08/30/2007  . GRAVES' DISEASE 12/07/2008  . 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 INSOMNIA 08/30/2007  . MIGRAINE HEADACHE 08/30/2007  . Neck pain   . Stroke [facility])     Patient Active Problem List   Diagnosis Date Noted  . Carpal tunnel  syndrome of right wrist 06/10/2015  . Morbid obesity due to excess calories (HCC) 06/10/2015  . Left-sided low back pain with left-sided sciatica 05/03/2015  . Essential hypertension, benign 12/30/2012  . Esophageal reflux 12/30/2012  . 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 ANXIETY 08/30/2007  . MIGRAINE HEADACHE 08/30/2007  . Insomnia 08/30/2007    Past Surgical History:  Procedure Laterality Date  . TUBAL LIGATION  2003    OB History    Gravida Para Term Preterm AB Living   4 1 1   3 1    SAB TAB Ectopic Multiple Live Births                   Home Medications    Prior to Admission medications   Medication Sig Start Date End Date Taking? Authorizing Provider  albuterol (PROVENTIL HFA;VENTOLIN HFA) 108 (90 BASE) MCG/ACT inhaler Inhale 1-2 puffs into the lungs every 6 (six) hours as needed for wheezing or shortness of breath. 02/14/15  Yes Lonia Skinner Sofia, PA-C  ALPRAZolam Prudy Feeler) 0.5 MG tablet TAKE ONE TABLET BY MOUTH AT BEDTIME AS NEEDED FOR SLEEP 09/06/15  Yes Campbell Riches, NP  BIOTIN PO Take 1 capsule by mouth daily.   Yes Historical Provider, MD  levothyroxine (SYNTHROID, LEVOTHROID) 175 MCG tablet TAKE ONE TABLET BY MOUTH ONCE DAILY BEFORE BREAKFAST 09/20/15  Yes Roma Kayser, MD  lisinopril (PRINIVIL,ZESTRIL) 10 MG tablet TAKE ONE TABLET BY  MOUTH ONCE DAILY 09/06/15  Yes Roma KayserGebreselassie W Nida, MD  phentermine (ADIPEX-P) 37.5 MG tablet Take 1 tablet (37.5 mg total) by mouth daily before breakfast. 06/11/15  Yes Campbell Richesarolyn C Hoskins, NP  cyclobenzaprine (FLEXERIL) 10 MG tablet Take 1 tablet (10 mg total) by mouth 2 (two) times daily as needed for muscle spasms. Patient not taking: Reported on 12/05/2015 04/30/15   Campbell Richesarolyn C Hoskins, NP  gabapentin (NEURONTIN) 300 MG capsule One po qam, one in the afternoon and 2 po qhs Patient not taking: Reported on 12/05/2015 10/18/15   Campbell Richesarolyn C Hoskins, NP  naproxen (NAPROSYN) 500 MG tablet TAKE ONE TABLET BY MOUTH  TWICE DAILY WITH A MEAL Patient not taking: Reported on 12/05/2015 10/07/15   Babs SciaraScott A Luking, MD  oxyCODONE-acetaminophen (PERCOCET/ROXICET) 5-325 MG tablet Take 1 tablet by mouth every 8 (eight) hours as needed for severe pain. Patient not taking: Reported on 12/05/2015 11/04/15   Babs SciaraScott A Luking, MD  valACYclovir (VALTREX) 1000 MG tablet Take 1 tablet (1,000 mg total) by mouth 3 (three) times daily. Patient not taking: Reported on 12/05/2015 11/04/15   Babs SciaraScott A Luking, MD    Family History Family History  Problem Relation Age of Onset  . Hypertension Mother   . Thyroid disease Neg Hx     Social History Social History  Substance Use Topics  . Smoking status: Current Every Day Smoker    Packs/day: 1.00    Years: 26.00    Types: Cigarettes    Last attempt to quit: 06/29/2012  . Smokeless tobacco: Never Used  . Alcohol use 0.0 oz/week     Comment: occasional     Allergies   Adhesive [tape]; Celexa [citalopram hydrobromide]; and Latex   Review of Systems Review of Systems  Constitutional: Negative for fever.  Respiratory: Negative for shortness of breath.   Cardiovascular: Negative for chest pain and leg swelling.  Gastrointestinal: Negative for abdominal distention, abdominal pain and constipation.       No bowel incontinence.   Genitourinary: Negative for difficulty urinating, dysuria, flank pain, frequency and urgency.       No bladder incontinence.  Musculoskeletal: Positive for back pain (lower). Negative for gait problem and joint swelling.  Skin: Negative for color change, rash and wound.  Neurological: Negative for weakness and numbness.     Physical Exam Updated Vital Signs BP 149/88 (BP Location: Left Arm)   Pulse 82   Temp 98 F (36.7 C) (Oral)   Resp 18   Ht 5\' 7"  (1.702 m)   Wt 200 lb (90.7 kg)   LMP 12/05/2015   SpO2 99%   BMI 31.32 kg/m   Physical Exam  Constitutional: She appears well-developed and well-nourished.  HENT:  Head: Normocephalic.    Eyes: Conjunctivae are normal.  Neck: Normal range of motion. Neck supple.  Cardiovascular: Normal rate and intact distal pulses.   Pedal pulses normal.  Pulmonary/Chest: Effort normal.  Abdominal: Soft. Bowel sounds are normal. She exhibits no distension and no mass.  Musculoskeletal: Normal range of motion. She exhibits no edema.       Lumbar back: She exhibits tenderness. She exhibits no swelling, no edema and no spasm.  Neurological: She is alert. She has normal strength. She displays no atrophy and no tremor. No sensory deficit. Gait normal.  Reflex Scores:      Patellar reflexes are 2+ on the right side and 2+ on the left side. No strength deficit noted in hip and knee flexor and extensor muscle groups.  Ankle flexion and extension intact.  Skin: Skin is warm and dry.  Psychiatric: She has a normal mood and affect.  Nursing note and vitals reviewed.    ED Treatments / Results  DIAGNOSTIC STUDIES: Oxygen Saturation is 99% on RA, nl by my interpretation.    COORDINATION OF CARE: 4:29 PM Discussed treatment plan with pt at bedside which includes percocet Rx and flexeril Rx and pt agreed to plan.    Procedures Procedures (including critical care time)  Medications Ordered in ED Medications - No data to display   Initial Impression / Assessment and Plan / ED Course  I have reviewed the triage vital signs and the nursing notes.   Clinical Course    No neuro deficit on exam or by history to suggest emergent or surgical presentation.  Also discussed worsened sx that should prompt immediate re-evaluation including distal weakness, bowel/bladder retention/incontinence. She was placed on flexeril, few percocet given until she can get rechecked by pcp.    Final Clinical Impressions(s) / ED Diagnoses   Final diagnoses:  None    New Prescriptions New Prescriptions   No medications on file    I personally performed the services described in this documentation, which  was scribed in my presence. The recorded information has been reviewed and is accurate.    Burgess Amor, PA-C 12/07/15 1056    Samuel Jester, DO 12/08/15 [phone]

## 2015-12-05 NOTE — Discharge Instructions (Signed)
T Do not drive within 4 hours of taking oxycodone as this will make you drowsy.  Avoid lifting,  Bending,  Twisting or any other activity that worsens your pain over the next week.  Apply heat therapy to your lower back for 10-15 minutes three times daily.  You should get rechecked if your symptoms are not better over the next 5 days,  Or you develop increased pain,  Weakness in your leg(s) or loss of bladder or bowel function - these are symptoms of a worse injury.

## 2015-12-08 ENCOUNTER — Other Ambulatory Visit: Payer: Self-pay | Admitting: "Endocrinology

## 2015-12-09 ENCOUNTER — Ambulatory Visit (INDEPENDENT_AMBULATORY_CARE_PROVIDER_SITE_OTHER): Payer: Medicaid Other | Admitting: Nurse Practitioner

## 2015-12-09 ENCOUNTER — Encounter: Payer: Self-pay | Admitting: Nurse Practitioner

## 2015-12-09 ENCOUNTER — Telehealth: Payer: Self-pay | Admitting: Nurse Practitioner

## 2015-12-09 VITALS — BP 112/70 | Ht 67.0 in | Wt 211.0 lb

## 2015-12-09 DIAGNOSIS — M792 Neuralgia and neuritis, unspecified: Secondary | ICD-10-CM | POA: Diagnosis not present

## 2015-12-09 DIAGNOSIS — M5441 Lumbago with sciatica, right side: Secondary | ICD-10-CM | POA: Diagnosis not present

## 2015-12-09 DIAGNOSIS — M5442 Lumbago with sciatica, left side: Secondary | ICD-10-CM | POA: Diagnosis not present

## 2015-12-09 MED ORDER — OXYCODONE-ACETAMINOPHEN 5-325 MG PO TABS: ORAL_TABLET | ORAL | 0 refills | Status: DC

## 2015-12-09 MED ORDER — CYCLOBENZAPRINE HCL 10 MG PO TABS: 10.0000 mg | ORAL_TABLET | Freq: Three times a day (TID) | ORAL | 0 refills | Status: DC | PRN

## 2015-12-09 MED ORDER — GABAPENTIN 300 MG PO CAPS: ORAL_CAPSULE | ORAL | 2 refills | Status: DC

## 2015-12-09 MED ORDER — NAPROXEN 500 MG PO TABS: 500.0000 mg | ORAL_TABLET | Freq: Two times a day (BID) | ORAL | 0 refills | Status: DC

## 2015-12-09 NOTE — Telephone Encounter (Signed)
Patient wants to know when she is supposed to take her naproxen? With her gabapentin?

## 2015-12-09 NOTE — Telephone Encounter (Signed)
Twice a day as needed; gabapentin is everyday as directed; can take them together.

## 2015-12-09 NOTE — Telephone Encounter (Signed)
Spoke with patient and informed her per Nathaneil Canaryarolyn Hoskins,NP- Twice a day as needed; gabapentin is everyday as directed; can take them together. Patient verbalized understanding.

## 2015-12-10 ENCOUNTER — Encounter: Payer: Self-pay | Admitting: Nurse Practitioner

## 2015-12-10 NOTE — Progress Notes (Signed)
Subjective:  Presents for recheck on her low back pain. Has been going on for several months see previous notes. Went to the emergency room on 9/17. Now having bilateral leg pain with low back pain. No abdominal pain. No change in bowel bladder habits. Pain will go down both legs to the posterior ankle area. Left is still slightly worse on the right side. Also has described a burning pinching sensation on the left posterior thigh intermittent lasting up to a minute at a time. Has taken Percocet and Flexeril at nighttime which helps her pain. Had stopped her gabapentin, was taking this without difficulty. Pain is worse with prolonged walking anything more than 10 minutes. Worse with bending and sitting. Difficulty performing her activities of daily living for example hard to stand while she is washing dishes. Difficulty sleeping due to pain. Pain on average 6-7/10.   Objective:   BP 112/70   Ht 5\' 7"  (1.702 m)   Wt 211 lb (95.7 kg)   LMP 12/05/2015   BMI 33.05 kg/m  NAD. Alert, oriented. Lungs clear. Heart regular rate rhythm. Generalized tenderness noted in the lumbar area. SLR slightly positive on the right negative on the left. Reflexes normal limit lower extremities. Gait slow but steady. Can perform flexion of the spine 30-45 degrees. Can perform heel walking, difficulty with toe walking.  Assessment:Bilateral low back pain with sciatica, sciatica laterality unspecified - Plan: MR Lumbar Spine Wo Contrast  Neuropathic pain - Plan: MR Lumbar Spine Wo Contrast  Plan:  Meds ordered this encounter  Medications  . cyclobenzaprine (FLEXERIL) 10 MG tablet    Sig: Take 1 tablet (10 mg total) by mouth 3 (three) times daily as needed for muscle spasms.    Dispense:  30 tablet    Refill:  0    Order Specific Question:   Supervising Provider    Answer:   Merlyn AlbertLUKING, WILLIAM S [[id number]]  . naproxen (NAPROSYN) 500 MG tablet    Sig: Take 1 tablet (500 mg total) by mouth 2 (two) times daily with a meal.   Dispense:  30 tablet    Refill:  0    Order Specific Question:   Supervising Provider    Answer:   Merlyn AlbertLUKING, WILLIAM S [[id number]]  . gabapentin (NEURONTIN) 300 MG capsule    Sig: One po qam, one in the afternoon and 2 po qhs    Dispense:  120 capsule    Refill:  2    Order Specific Question:   Supervising Provider    Answer:   Merlyn AlbertLUKING, WILLIAM S [[id number]]  . oxyCODONE-acetaminophen (PERCOCET/ROXICET) 5-325 MG tablet    Sig: One po qhs prn pain    Dispense:  20 tablet    Refill:  0    Order Specific Question:   Supervising Provider    Answer:   Merlyn AlbertLUKING, WILLIAM S [[id number]]   Restart gabapentin as directed. Add naproxen. Continue Flexeril. Given a second short prescription for Percocet, take no more than one whole tablet per day either half tab twice a day or 1 at bedtime. Patient understands this is a temporary measure due to severity of pain at nighttime. MRI pending. Patient to call back sooner if needed.

## 2015-12-10 NOTE — Telephone Encounter (Signed)
Pt required authorization for Oxycodone-Acetominophen. Completed [facility] and [facility].

## 2015-12-16 ENCOUNTER — Ambulatory Visit (HOSPITAL_COMMUNITY)
Admission: RE | Admit: 2015-12-16 | Discharge: 2015-12-16 | Disposition: A | Discharge status: Home / Self Care | Payer: Medicaid Other | Source: Ambulatory Visit | Attending: Nurse Practitioner | Admitting: Nurse Practitioner

## 2015-12-16 DIAGNOSIS — M5134 Other intervertebral disc degeneration, thoracic region: Secondary | ICD-10-CM | POA: Diagnosis not present

## 2015-12-16 DIAGNOSIS — M5442 Lumbago with sciatica, left side: Secondary | ICD-10-CM | POA: Diagnosis not present

## 2015-12-16 DIAGNOSIS — M4316 Spondylolisthesis, lumbar region: Secondary | ICD-10-CM | POA: Insufficient documentation

## 2015-12-16 DIAGNOSIS — M5136 Other intervertebral disc degeneration, lumbar region: Secondary | ICD-10-CM | POA: Insufficient documentation

## 2015-12-16 DIAGNOSIS — M5441 Lumbago with sciatica, right side: Secondary | ICD-10-CM | POA: Insufficient documentation

## 2015-12-16 DIAGNOSIS — M5137 Other intervertebral disc degeneration, lumbosacral region: Secondary | ICD-10-CM | POA: Insufficient documentation

## 2015-12-16 DIAGNOSIS — K8020 Calculus of gallbladder without cholecystitis without obstruction: Secondary | ICD-10-CM | POA: Diagnosis not present

## 2015-12-17 ENCOUNTER — Telehealth: Payer: Self-pay | Admitting: Family Medicine

## 2015-12-17 NOTE — Telephone Encounter (Signed)
Spoke with patient and informed her per Nathaneil Canaryarolyn Hoskins,NP-  your MRI shows bulging discs and degenerative changes but no impingement. Will send in referral to neurosurgeon for evaluation. Patient verbalized understanding and stated which ever Neurosurgeon is the most local that [facility] will work for her.

## 2015-12-17 NOTE — Telephone Encounter (Signed)
Pt stated that she read the my chart message and is ok with being referred to a neurosurgeon. There is no preference as long as they are good and accepts medicaid.

## 2015-12-17 NOTE — Telephone Encounter (Signed)
Patient requesting results to her MRI she had done.  I advised her that Dudleyarolyn sent her a message through MyChart, but she would rather a nurse call her.

## 2015-12-20 ENCOUNTER — Other Ambulatory Visit: Payer: Self-pay | Admitting: Nurse Practitioner

## 2015-12-20 DIAGNOSIS — M792 Neuralgia and neuritis, unspecified: Secondary | ICD-10-CM

## 2015-12-20 DIAGNOSIS — M545 Low back pain: Secondary | ICD-10-CM

## 2016-01-05 ENCOUNTER — Encounter: Payer: Self-pay | Admitting: Family Medicine

## 2016-01-05 ENCOUNTER — Ambulatory Visit (INDEPENDENT_AMBULATORY_CARE_PROVIDER_SITE_OTHER): Payer: Medicaid Other | Admitting: Family Medicine

## 2016-01-05 VITALS — BP 130/80 | Temp 98.0°F | Ht 67.0 in | Wt 214.1 lb

## 2016-01-05 DIAGNOSIS — J4521 Mild intermittent asthma with (acute) exacerbation: Secondary | ICD-10-CM | POA: Diagnosis not present

## 2016-01-05 DIAGNOSIS — J0120 Acute ethmoidal sinusitis, unspecified: Secondary | ICD-10-CM | POA: Diagnosis not present

## 2016-01-05 MED ORDER — ALBUTEROL SULFATE HFA 108 (90 BASE) MCG/ACT IN AERS: 1.0000 | INHALATION_SPRAY | RESPIRATORY_TRACT | 0 refills | Status: DC | PRN

## 2016-01-05 MED ORDER — AMOXICILLIN-POT CLAVULANATE 875-125 MG PO TABS: 1.0000 | ORAL_TABLET | Freq: Two times a day (BID) | ORAL | 0 refills | Status: AC

## 2016-01-05 MED ORDER — PREDNISONE 20 MG PO TABS: ORAL_TABLET | ORAL | 0 refills | Status: DC

## 2016-01-05 NOTE — Progress Notes (Deleted)
   Subjective:    Patient ID: Teresa Vincent, female    DOB: 07/26/1980, 35 y.o.   MRN: [id number]  HPI    Review of Systems     Objective:   Physical Exam        Assessment & Plan:

## 2016-01-05 NOTE — Progress Notes (Signed)
   Subjective:    Patient ID: Lenard LanceCarolyn J Teal, female    DOB: 04/02/1980, 35 y.o.   MRN: [id number]  Sinusitis  This is a new problem. The current episode started in the past 7 days. The problem is unchanged. There has been no fever. The pain is moderate. Associated symptoms include congestion, coughing, ear pain, headaches and a sore throat. (Wheezing, abdominal pain) Treatments tried: Mucinex, cough drops, sore throat spray. The treatment provided no relief.   Uses inbhaler on occasion  headach and frontal and tender   Review of Systems  HENT: Positive for congestion, ear pain and sore throat.   Respiratory: Positive for cough.   Neurological: Positive for headaches.       Objective:   Physical Exam  Alert, mild malaise. Hydration good Vitals stable. frontal/ maxillary tenderness evident positive nasal congestion. pharynx normal neck supple  lungs clear/no cracklesHowever there are positive r wheezes. heart regular in rhythm       Assessment & Plan:  Impression rhinosinusitisWith element of reactive airways and significant exacerbation. likely post viral, discussed with patient. plan antibiotics prescribed prednisone taper. Albuterol 2 sprays 4 times a day.. Questions answered. Symptomatic care discussed. warning signs discussed. WSL

## 2016-01-06 ENCOUNTER — Other Ambulatory Visit: Payer: Self-pay

## 2016-01-06 MED ORDER — ALBUTEROL SULFATE HFA 108 (90 BASE) MCG/ACT IN AERS: 1.0000 | INHALATION_SPRAY | RESPIRATORY_TRACT | 0 refills | Status: DC | PRN

## 2016-01-12 ENCOUNTER — Ambulatory Visit (HOSPITAL_COMMUNITY): Payer: Medicaid Other | Attending: Neurological Surgery | Admitting: Physical Therapy

## 2016-01-12 DIAGNOSIS — M5442 Lumbago with sciatica, left side: Secondary | ICD-10-CM | POA: Insufficient documentation

## 2016-01-12 DIAGNOSIS — G8929 Other chronic pain: Secondary | ICD-10-CM | POA: Diagnosis present

## 2016-01-12 DIAGNOSIS — M5441 Lumbago with sciatica, right side: Secondary | ICD-10-CM | POA: Insufficient documentation

## 2016-01-12 DIAGNOSIS — M6281 Muscle weakness (generalized): Secondary | ICD-10-CM | POA: Insufficient documentation

## 2016-01-12 DIAGNOSIS — R293 Abnormal posture: Secondary | ICD-10-CM | POA: Insufficient documentation

## 2016-01-12 NOTE — Patient Instructions (Signed)
   Lumbar Rotations   Lying on your back with your knees bent, slowly drop your legs to one side and hold the stretch. Come back to the middle and switch sides. You should feel the stretch in your back on the opposite side that your legs are leaning.  Repeat 5 times each side, with 10 second holds, twice a day.     PELVIC TILT - SUPINE  Lie on your back with your knees bent. Next flatten your low back onto the table and hold for 3 seconds. Your pelvis should tilt forward and back during the movement. Move through a comfortable range of motion. I do not want you to arch your back off of the table, just focus on the flattening motion.  Repeat 10 times, with 3 second holds, twice a day.     PIRIFORMIS AND HIP STRETCH - SEATED  While sitting in a chair, cross your affected leg on top of the other as shown.   Next, gently lean forward (MAINTAIN GOOD POSTURE, DON'T BEND FORWARD LIKE THE FELLOW IN THE PICTURE) until a stretch is felt along the crossed leg.  Hold for 30 seconds and repeat twice each side, twice a day.     ELASTIC BAND - SEATED CLAMS  While sitting in a chair and an elastic band wrapped around your knees, move both knees to the sides to separate your legs. Keep contact of your feet on the floor the entire time.   Repeat 10 times with green therband, twice a day.     SQUAT  Start with feet shoulder width apart (you do not need the Kettlebell). Next, squat down while keeping your weight through your heels and making sure you are sticking your rear backwards. Do not let your knees pass your toes. Keep your back straight.  Repeat 10 times, twice a day.

## 2016-01-12 NOTE — Therapy (Signed)
[facility] [address] [place], [place], [address] Phone: [phone]   Fax:  [phone]  Physical Therapy Evaluation  Patient Details  Name: MELODYE SWOR MRN: [id number] Date of Birth: [birth year] Referring Provider: Loura Halt Ditty   Encounter Date: 01/12/2016      PT End of Session - 01/12/16 0955    Visit Number 1   Number of Visits 1   Authorization Type Medicaid    Authorization Time Period 01/12/16 to 01/12/16   Authorization - Visit Number 1   Authorization - Number of Visits 1   PT Start Time 0901   PT Stop Time 0943   PT Time Calculation (min) 42 min   Activity Tolerance Patient tolerated treatment well   Behavior During Therapy [facility] for tasks assessed/performed      Past Medical History:  Diagnosis Date  . ANXIETY 08/30/2007  . GRAVES' DISEASE 12/07/2008  . 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 INSOMNIA 08/30/2007  . MIGRAINE HEADACHE 08/30/2007  . Neck pain   . Stroke [facility])     Past Surgical History:  Procedure Laterality Date  . TUBAL LIGATION  2003    There were no vitals filed for this visit.       Subjective Assessment - 01/12/16 0904    Subjective Patient has had this current back pain for about 2 years on and off, but the last 5 months have been more constant and more intolerable; she has been told that her pelvis and hips shift when she walks. Her pain stops her from doing everything- sitting, standing, laying, bending, she even has a hard time standing to do dishes and has to lean forward. It is a burning, pinching pain, she cannot lay on her back or sides or anything hard. She cannot get up from the floor by herself. No significant bowel or bladder changes, or incontinence. Her sciatic symtpoms run all the way to the back of her ankle, as well as to her knee on the front.    Pertinent History HTN, anxiety, obesity, Grave's Disease, CVA (2009) with no major residual symptoms    How  long can you sit comfortably? 10-15 minutes    How long can you stand comfortably? 10-15 minutes    How long can you walk comfortably? burns more and faster with walking, 10-15 minutes    Diagnostic tests see MRI done on 9/28   Patient Stated Goals reduce pain    Currently in Pain? Yes   Pain Score 5    Pain Location Other (Comment)  back and hips    Pain Orientation Lower;Left;Right   Pain Descriptors / Indicators Burning;Pressure;Aching   Pain Type Chronic pain   Pain Radiating Towards down both legs front to the knee and down to the ankles in the back    Pain Onset More than a month ago   Pain Frequency Constant   Aggravating Factors  everything   Pain Relieving Factors nothing    Effect of Pain on Daily Activities severe effect on ADLs             [facility] PT Assessment - 01/12/16 0001      Assessment   Medical Diagnosis back pain    Referring Provider Loura Halt Ditty    Onset Date/Surgical Date --  chronic    Next MD Visit none unless needed    Prior Therapy none      Balance Screen   Has the patient fallen in the past  6 months No   Has the patient had a decrease in activity level because of a fear of falling?  No   Is the patient reluctant to leave their home because of a fear of falling?  No     Prior Function   Level of Independence Independent;Independent with basic ADLs;Independent with gait;Independent with transfers   Vocation Unemployed     AROM   Overall AROM Comments moderate limitation with lumbar flexion, hypermobile lumbar extension, fingertips to midline of knee B; no changes in pain with repeated movements   lumbar rotation limited moderately      Strength   Overall Strength Comments general strength 4-5/5 in B LEs and hips; exception is 3/5 L hip ABD                             PT Education - 01/12/16 0954    Education provided Yes   Education Details prognosis, HEP, dynamics of chronic pain adaptations and compensations  and functional importance of overcoming these   Person(s) Educated Patient   Methods Explanation;Demonstration;Handout   Comprehension Verbalized understanding;Returned demonstration;Need further instruction          PT Short Term Goals - 01/12/16 0958      PT SHORT TERM GOAL #1   Title Patient to be independent in correctly and consistently performing appropriate HEP   Time 1   Period Days   Status New           PT Long Term Goals - 01/12/16 0959      PT LONG TERM GOAL #1   Title N/A-one time visit only    Time 0   Period Days   Status New               Plan - 01/12/16 0956    Clinical Impression Statement Patient arrives today with chronic back pain, which has exacerbated within the past 5 months or so; she states that her pain is so intense that it stops her from doing basically everything however she displays no signs of pain related distress this session. She does state that her L hip locked up the other night and is a bit tender today, this has never happened before. Upon examination she does reveal general lumbar stiffness, some mild functional weakness, muscle stiffness, and appears to present with general compensatory effects of chronic pain. Assigned appropriate extensive HEP today, which patient was able to perform with no exacerbation of pain and good form. No further skilled PT services warranted due to insurance limitations, one time visit only.    Rehab Potential Fair   Clinical Impairments Affecting Rehab Potential chronicity of pain, compliance concerns to HEP    PT Frequency One time visit   PT Duration Other (comment)  one time visit    PT Treatment/Interventions ADLs/Self Care Home Management   PT Next Visit Plan no further skilled services, one time visit    PT Home Exercise Plan assigned extensive HEP at eval   Consulted and Agree with Plan of Care Patient      Patient will benefit from skilled therapeutic intervention in order to improve the  following deficits and impairments:  Improper body mechanics, Pain, Postural dysfunction, Hypomobility, Decreased strength, Difficulty walking  Visit Diagnosis: Chronic bilateral low back pain with bilateral sciatica - Plan: PT plan of care cert/re-cert  Muscle weakness (generalized) - Plan: PT plan of care cert/re-cert  Abnormal posture - Plan: PT  plan of care cert/re-cert     Problem List Patient Active Problem List   Diagnosis Date Noted  . 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/10/2015  . Morbid obesity due to excess calories (HCC) 06/10/2015  . Left-sided low back pain with left-sided sciatica 05/03/2015  . Essential hypertension, benign 12/30/2012  . Esophageal reflux 12/30/2012  . 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 ANXIETY 08/30/2007  . MIGRAINE HEADACHE 08/30/2007  . Insomnia 08/30/2007    Nedra Hai PT, DPT [phone]  [facility] [address] [place], [place], [address] Phone: [phone]   Fax:  [phone]  Name: ASAIAH HUNNICUTT MRN: [id number] Date of Birth: 1980-06-28

## 2016-01-14 ENCOUNTER — Ambulatory Visit: Payer: Medicaid Other | Admitting: Nurse Practitioner

## 2016-01-20 ENCOUNTER — Other Ambulatory Visit: Payer: Self-pay | Admitting: *Deleted

## 2016-01-20 ENCOUNTER — Telehealth: Payer: Self-pay | Admitting: Family Medicine

## 2016-01-20 MED ORDER — LEVOFLOXACIN 500 MG PO TABS: 500.0000 mg | ORAL_TABLET | Freq: Every day | ORAL | 0 refills | Status: DC

## 2016-01-20 NOTE — Telephone Encounter (Signed)
Lev 500 qd ten d 

## 2016-01-20 NOTE — Telephone Encounter (Signed)
Pt called stating that the medication she was given did not work and pt is still really congested. Pt seen Dr. Brett CanalesSteve on 10/18 and was given predniSONE (DELTASONE) 20 MG tablet   [facility] [place]

## 2016-01-20 NOTE — Progress Notes (Unsigned)
Lev

## 2016-01-20 NOTE — Telephone Encounter (Signed)
Seen 10/18. Prescribed augmentin and prednisone. Pt still having cough, congestion, ear pain, sob when coughing but inhaler is helping with that. No fever.

## 2016-01-20 NOTE — Telephone Encounter (Signed)
Discussed with pt. Med sent to pharm.  

## 2016-02-06 ENCOUNTER — Encounter (HOSPITAL_COMMUNITY): Payer: Self-pay | Admitting: Emergency Medicine

## 2016-02-06 ENCOUNTER — Emergency Department (HOSPITAL_COMMUNITY)
Admission: EM | Admit: 2016-02-06 | Discharge: 2016-02-06 | Disposition: A | Discharge status: Home / Self Care | Payer: Medicaid Other | Attending: Emergency Medicine | Admitting: Emergency Medicine

## 2016-02-06 DIAGNOSIS — E039 Hypothyroidism, unspecified: Secondary | ICD-10-CM | POA: Diagnosis not present

## 2016-02-06 DIAGNOSIS — I10 Essential (primary) hypertension: Secondary | ICD-10-CM | POA: Insufficient documentation

## 2016-02-06 DIAGNOSIS — Z79899 Other long term (current) drug therapy: Secondary | ICD-10-CM | POA: Diagnosis not present

## 2016-02-06 DIAGNOSIS — M545 Low back pain: Secondary | ICD-10-CM | POA: Diagnosis present

## 2016-02-06 DIAGNOSIS — F17210 Nicotine dependence, cigarettes, uncomplicated: Secondary | ICD-10-CM | POA: Insufficient documentation

## 2016-02-06 DIAGNOSIS — M5442 Lumbago with sciatica, left side: Secondary | ICD-10-CM

## 2016-02-06 HISTORY — DX: Unspecified abnormal cytological findings in specimens from cervix uteri: R87.619

## 2016-02-06 MED ORDER — HYDROMORPHONE HCL 1 MG/ML IJ SOLN
2.0000 mg | Freq: Once | INTRAMUSCULAR | Status: AC
  Administered 2016-02-06: 2 mg via INTRAMUSCULAR
  Filled 2016-02-06: qty 2

## 2016-02-06 MED ORDER — OXYCODONE-ACETAMINOPHEN 5-325 MG PO TABS: 1.0000 | ORAL_TABLET | ORAL | 0 refills | Status: DC | PRN

## 2016-02-06 MED ORDER — CYCLOBENZAPRINE HCL 10 MG PO TABS: 10.0000 mg | ORAL_TABLET | Freq: Two times a day (BID) | ORAL | 0 refills | Status: DC | PRN

## 2016-02-06 NOTE — Discharge Instructions (Signed)
Meds for pain and muscle spasm.  Ice pack.  See your dr

## 2016-02-06 NOTE — ED Provider Notes (Signed)
[facility]-EMERGENCY DEPT Provider Note   CSN: [id number] Arrival date & time: 02/06/16  [id number]   By signing my name below, I, Valentino SaxonBianca Contreras, attest that this documentation has been prepared under the direction and in the presence of Donnetta HutchingBrian Deven Audi, MD. Electronically Signed: Valentino SaxonBianca Contreras, ED Scribe. 02/06/16. 9:56 AM.  History   Chief Complaint Chief Complaint  Patient presents with  . Back Pain   The history is provided by the patient. No language interpreter was used.     HPI Comments: Teresa Vincent is a 35 y.o. female who presents to the Emergency Department complaining of moderate, constant, lower back pain with accompanied left hip pain onset today. Pt notes she has been having back pain for the past 5-6 months. Pt notes she is seeing a neurosurgeon for her back pain. Per pt, she notes she was diagnosed with sacroiliitis. Pt reports she has had two injections this past month, 1 was administered on 11/01, following a second dosage on 11/14. She notes the first injection administered on 11/01 had no significant relief. Pt reports she is having lower back pain that is affecting her bilateral legs and hips. Pt reports she triggered her back pain today when she attempted to put on her left shoe. Pt notes she felt a stinging accompanied with a  burning and pinching sensation. Pt states she is unable to stand and bear weight on her left side. She also describes having pain radiating to her buttock and left hip. She notes pain is worsened with deep breathing. Pt also states pain is worsened when she is in a relaxed position. She feels as if her muscles are being pulled apart. Pt reports she is currently taking diclofenac to treat pain and inflammation, but notes minimal relief. No additional complaints at this time.    Past Medical History:  Diagnosis Date  . Abnormal Pap smear of cervix   . ANXIETY 08/30/2007  . GRAVES' DISEASE 12/07/2008  . 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  INSOMNIA 08/30/2007  . MIGRAINE HEADACHE 08/30/2007  . Neck pain   . Stroke [facility](HCC)     Patient Active Problem List   Diagnosis Date Noted  . 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/10/2015  . Morbid obesity due to excess calories (HCC) 06/10/2015  . Left-sided low back pain with left-sided sciatica 05/03/2015  . Essential hypertension, benign 12/30/2012  . Esophageal reflux 12/30/2012  . 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 ANXIETY 08/30/2007  . MIGRAINE HEADACHE 08/30/2007  . Insomnia 08/30/2007    Past Surgical History:  Procedure Laterality Date  . LUMBAR EPIDURAL INJECTION    . TUBAL LIGATION  2003    OB History    Gravida Para Term Preterm AB Living   4 1 1   3 1    SAB TAB Ectopic Multiple Live Births                   Home Medications    Prior to Admission medications   Medication Sig Start Date End Date Taking? Authorizing Provider  albuterol (PROVENTIL HFA;VENTOLIN HFA) 108 (90 Base) MCG/ACT inhaler Inhale 1-2 puffs into the lungs every 4 (four) hours as needed for wheezing or shortness of breath. 01/06/16  Yes Merlyn AlbertWilliam S Luking, MD  ALPRAZolam Prudy Feeler(XANAX) 0.5 MG tablet TAKE ONE TABLET BY MOUTH AT BEDTIME AS NEEDED FOR SLEEP 09/06/15  Yes Campbell Richesarolyn C Hoskins, NP  BIOTIN PO Take 1 capsule by mouth daily.   Yes Historical Provider, MD  diclofenac (VOLTAREN)  75 MG EC tablet Take 75 mg by mouth 2 (two) times daily.   Yes Historical Provider, MD  gabapentin (NEURONTIN) 300 MG capsule One po qam, one in the afternoon and 2 po qhs Patient taking differently: Take 300 mg by mouth 4 (four) times daily.  12/09/15  Yes Campbell Riches, NP  levothyroxine (SYNTHROID, LEVOTHROID) 175 MCG tablet TAKE ONE TABLET BY MOUTH ONCE DAILY BEFORE BREAKFAST 09/20/15  Yes Roma Kayser, MD  lisinopril (PRINIVIL,ZESTRIL) 10 MG tablet TAKE ONE TABLET BY MOUTH ONCE DAILY 12/09/15  Yes Roma Kayser, MD  cyclobenzaprine (FLEXERIL) 10 MG tablet Take 1 tablet (10 mg  total) by mouth 2 (two) times daily as needed for muscle spasms. 02/06/16   Donnetta Hutching, MD  oxyCODONE-acetaminophen (PERCOCET) 5-325 MG tablet Take 1-2 tablets by mouth every 4 (four) hours as needed. 02/06/16   Donnetta Hutching, MD    Family History Family History  Problem Relation Age of Onset  . Hypertension Mother   . Thyroid disease Neg Hx     Social History Social History  Substance Use Topics  . Smoking status: Current Every Day Smoker    Packs/day: 1.00    Years: 26.00    Types: Cigarettes    Last attempt to quit: 06/29/2012  . Smokeless tobacco: Never Used  . Alcohol use 0.0 oz/week     Comment: occasional     Allergies   Adhesive [tape] and Celexa [citalopram hydrobromide]   Review of Systems Review of Systems  Musculoskeletal: Positive for arthralgias (left hip ), back pain and myalgias (bilateral legs ).  All other systems reviewed and are negative.    Physical Exam Updated Vital Signs BP 130/91 (BP Location: Left Arm)   Pulse 77   Temp 98.4 F (36.9 C) (Oral)   Resp 18   Ht 5\' 7"  (1.702 m)   Wt 220 lb (99.8 kg)   LMP 01/31/2016   SpO2 97%   BMI 34.46 kg/m   Physical Exam  Constitutional: She is oriented to person, place, and time. She appears well-developed and well-nourished.  HENT:  Head: Normocephalic and atraumatic.  Eyes: Conjunctivae are normal.  Neck: Neck supple.  Musculoskeletal: Normal range of motion. She exhibits tenderness.  Tenderness to lower back. Pain with flexion of left hip joint.   Neurological: She is alert and oriented to person, place, and time.  Skin: Skin is warm and dry.  Psychiatric: She has a normal mood and affect. Her behavior is normal.  Nursing note and vitals reviewed.    ED Treatments / Results   DIAGNOSTIC STUDIES: Oxygen Saturation is 97% on RA, normal by my interpretation.    COORDINATION OF CARE: 10:39 AM Discussed treatment plan with pt at bedside and pt agreed to plan.   Labs (all labs ordered are  listed, but only abnormal results are displayed) Labs Reviewed - No data to display  EKG  EKG Interpretation None       Radiology No results found.  Procedures Procedures (including critical care time)  Medications Ordered in ED Medications  HYDROmorphone (DILAUDID) injection 2 mg (2 mg Intramuscular Given 02/06/16 1051)     Initial Impression / Assessment and Plan / ED Course  I have reviewed the triage vital signs and the nursing notes.  Pertinent labs & imaging results that were available during my care of the patient were reviewed by me and considered in my medical decision making (see chart for details).  Pt will be given immediate pain management,  a shot of Dilaudid IM. She will be sent home with muscle relaxers and pain medication. Pt is advised to continue on diclofenac in the meantime.    Clinical Course     Patient has had recurrent low back pain. She has recently had two steroid injections. Pain became more apparent this morning after bending over. Symptoms radiate to the left leg. No bowel or bladder incontinence. Will treat pain and refer her back to her primary care physician  Final Clinical Impressions(s) / ED Diagnoses   Final diagnoses:  Acute left-sided low back pain with left-sided sciatica    New Prescriptions Discharge Medication List as of 02/06/2016 11:09 AM     I personally performed the services described in this documentation, which was scribed in my presence. The recorded information has been reviewed and is accurate.      Donnetta HutchingBrian Ricci Dirocco, MD 02/07/16 [phone]

## 2016-02-06 NOTE — ED Triage Notes (Signed)
Patient c/o lower back pain that radiates into left hip. Per patient started this morning while lifting her left leg to put boot on. Patient states "I felt and pinch and then it started to burn." Per patient unable to stand straight and hurts more with deep breath. Patient states that she had a epidural/steriod injection on 11/15 which she got for back pain and states "My back felt great afterward till today when this happened."

## 2016-02-11 ENCOUNTER — Encounter (HOSPITAL_COMMUNITY): Payer: Self-pay | Admitting: Emergency Medicine

## 2016-02-11 ENCOUNTER — Emergency Department (HOSPITAL_COMMUNITY)
Admission: EM | Admit: 2016-02-11 | Discharge: 2016-02-12 | Disposition: A | Discharge status: Home / Self Care | Payer: Medicaid Other | Attending: Emergency Medicine | Admitting: Emergency Medicine

## 2016-02-11 ENCOUNTER — Emergency Department (HOSPITAL_COMMUNITY): Payer: Medicaid Other

## 2016-02-11 DIAGNOSIS — I10 Essential (primary) hypertension: Secondary | ICD-10-CM | POA: Diagnosis not present

## 2016-02-11 DIAGNOSIS — F17210 Nicotine dependence, cigarettes, uncomplicated: Secondary | ICD-10-CM | POA: Insufficient documentation

## 2016-02-11 DIAGNOSIS — E039 Hypothyroidism, unspecified: Secondary | ICD-10-CM | POA: Insufficient documentation

## 2016-02-11 DIAGNOSIS — Z79899 Other long term (current) drug therapy: Secondary | ICD-10-CM | POA: Diagnosis not present

## 2016-02-11 DIAGNOSIS — J209 Acute bronchitis, unspecified: Secondary | ICD-10-CM | POA: Diagnosis not present

## 2016-02-11 DIAGNOSIS — R05 Cough: Secondary | ICD-10-CM | POA: Diagnosis present

## 2016-02-11 MED ORDER — AZITHROMYCIN 250 MG PO TABS: 250.0000 mg | ORAL_TABLET | Freq: Every day | ORAL | 0 refills | Status: DC

## 2016-02-11 MED ORDER — METHYLPREDNISOLONE SODIUM SUCC 125 MG IJ SOLR: 125.0000 mg | Freq: Once | INTRAMUSCULAR | Status: DC

## 2016-02-11 NOTE — ED Triage Notes (Signed)
Pt reports R sided pain around the shoulder blade that now radiates around under her R breast. Pt also reports productive cough with greenish colored sputum. Pt thinks she "might have pleurisy."

## 2016-02-11 NOTE — ED Provider Notes (Signed)
[facility]-EMERGENCY DEPT Provider Note   CSN: [id number] Arrival date & time: 02/11/16  2120     History   Chief Complaint Chief Complaint  Patient presents with  . Cough    HPI Teresa Vincent is a 35 y.o. female.  The history is provided by the patient. No language interpreter was used.  Cough  This is a recurrent problem. The problem occurs constantly. The problem has been gradually worsening. The cough is productive of sputum. There has been no fever. Associated symptoms include chest pain. She has tried nothing for the symptoms. She is a smoker. Her past medical history is significant for bronchitis.  Pt has been treated x2 for bronchtis.  Pt has been on augmentin, levaquin, prednisone and albuterol.  Pt reports cough has returned and she has a productive cough.    Past Medical History:  Diagnosis Date  . Abnormal Pap smear of cervix   . ANXIETY 08/30/2007  . GRAVES' DISEASE 12/07/2008  . 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 INSOMNIA 08/30/2007  . MIGRAINE HEADACHE 08/30/2007  . Neck pain   . Stroke [facility])     Patient Active Problem List   Diagnosis Date Noted  . 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/10/2015  . Morbid obesity due to excess calories (HCC) 06/10/2015  . Left-sided low back pain with left-sided sciatica 05/03/2015  . Essential hypertension, benign 12/30/2012  . Esophageal reflux 12/30/2012  . 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 ANXIETY 08/30/2007  . MIGRAINE HEADACHE 08/30/2007  . Insomnia 08/30/2007    Past Surgical History:  Procedure Laterality Date  . LUMBAR EPIDURAL INJECTION    . TUBAL LIGATION  2003    OB History    Gravida Para Term Preterm AB Living   4 1 1   3 1    SAB TAB Ectopic Multiple Live Births                   Home Medications    Prior to Admission medications   Medication Sig Start Date End Date Taking? Authorizing Provider  albuterol (PROVENTIL HFA;VENTOLIN HFA) 108 (90 Base) MCG/ACT  inhaler Inhale 1-2 puffs into the lungs every 4 (four) hours as needed for wheezing or shortness of breath. 01/06/16  Yes [name], MD  ALPRAZolam Prudy Feeler) 0.5 MG tablet TAKE ONE TABLET BY MOUTH AT BEDTIME AS NEEDED FOR SLEEP 09/06/15  Yes Campbell Riches, NP  BIOTIN PO Take 1 capsule by mouth daily.   Yes Historical Provider, MD  cyclobenzaprine (FLEXERIL) 10 MG tablet Take 1 tablet (10 mg total) by mouth 2 (two) times daily as needed for muscle spasms. 02/06/16  Yes Donnetta Hutching, MD  diclofenac (VOLTAREN) 75 MG EC tablet Take 75 mg by mouth 2 (two) times daily.   Yes Historical Provider, MD  gabapentin (NEURONTIN) 300 MG capsule One po qam, one in the afternoon and 2 po qhs Patient taking differently: Take 300 mg by mouth 4 (four) times daily.  12/09/15  Yes Campbell Riches, NP  levothyroxine (SYNTHROID, LEVOTHROID) 175 MCG tablet TAKE ONE TABLET BY MOUTH ONCE DAILY BEFORE BREAKFAST 09/20/15  Yes Roma Kayser, MD  lisinopril (PRINIVIL,ZESTRIL) 10 MG tablet TAKE ONE TABLET BY MOUTH ONCE DAILY 12/09/15  Yes Roma Kayser, MD  oxyCODONE-acetaminophen (PERCOCET) 5-325 MG tablet Take 1-2 tablets by mouth every 4 (four) hours as needed. 02/06/16  Yes Donnetta Hutching, MD    Family History Family History  Problem Relation Age of Onset  . Hypertension  Mother   . Thyroid disease Neg Hx     Social History Social History  Substance Use Topics  . Smoking status: Current Every Day Smoker    Packs/day: 1.00    Years: 26.00    Types: Cigarettes    Last attempt to quit: 06/29/2012  . Smokeless tobacco: Never Used  . Alcohol use 0.0 oz/week     Comment: occasional     Allergies   Adhesive [tape] and Celexa [citalopram hydrobromide]   Review of Systems Review of Systems  Respiratory: Positive for cough.   Cardiovascular: Positive for chest pain.  All other systems reviewed and are negative.    Physical Exam Updated Vital Signs BP 147/99   Pulse 90   Temp 98.6 F (37 C)  (Oral)   Resp 18   Ht 5\' 7"  (1.702 m)   Wt 99.8 kg   LMP 01/31/2016   SpO2 97%   BMI 34.46 kg/m   Physical Exam  Constitutional: She is oriented to person, place, and time. She appears well-developed and well-nourished.  HENT:  Head: Normocephalic.  Eyes: EOM are normal.  Neck: Normal range of motion.  Pulmonary/Chest: Effort normal.  Loud rhonchi  Abdominal: She exhibits no distension.  Musculoskeletal: Normal range of motion.  Neurological: She is alert and oriented to person, place, and time.  Psychiatric: She has a normal mood and affect.  Nursing note and vitals reviewed.    ED Treatments / Results  Labs (all labs ordered are listed, but only abnormal results are displayed) Labs Reviewed - No data to display  EKG  EKG Interpretation None       Radiology Dg Chest 2 View  Result Date: 02/11/2016 CLINICAL DATA:  35 year old female with pain radiating from the right shoulder to under the right breast for 2 days, worse today. Productive cough since October. Initial encounter. Smoker. EXAM: CHEST  2 VIEW COMPARISON:  02/14/2015 chest radiographs. FINDINGS: Normal cardiac size and mediastinal contours. Lung volumes are stable and normal. Visualized tracheal air column is within normal limits. Mild diffuse increased pulmonary interstitial markings appear stable since 2016 and may be related to smoking. No pneumothorax, pulmonary edema, pleural effusion or confluent pulmonary opacity. No acute osseous abnormality identified. IMPRESSION: No acute cardiopulmonary abnormality. Electronically Signed   By: Odessa FlemingH  Hall M.D.   On: 02/11/2016 22:00    Procedures Procedures (including critical care time)  Medications Ordered in ED Medications  methylPREDNISolone sodium succinate (SOLU-MEDROL) 125 mg/2 mL injection 125 mg (not administered)     Initial Impression / Assessment and Plan / ED Course  I have reviewed the triage vital signs and the nursing notes.  Pertinent labs &  imaging results that were available during my care of the patient were reviewed by me and considered in my medical decision making (see chart for details).  Clinical Course     Chest xray shows bronchitis/ changes from smoking.   Pt counseled on the need to stop smoking.  Pt advised to use albuterol inhaler q4 hours.  Rx for zithromax.   Pt advised to follow up with Dr. Gerda DissLuking for recheck next week  Final Clinical Impressions(s) / ED Diagnoses   Final diagnoses:  Acute bronchitis, unspecified organism    New Prescriptions New Prescriptions   AZITHROMYCIN (ZITHROMAX) 250 MG TABLET    Take 1 tablet (250 mg total) by mouth daily. Take first 2 tablets together, then 1 every day until finished.     Elson AreasLeslie K Perris Tripathi, PA-C 02/11/16 2354  Glynn OctaveStephen Rancour, MD 02/12/16 [phone]

## 2016-02-12 MED ORDER — HYDROCODONE-ACETAMINOPHEN 5-325 MG PO TABS: 2.0000 | ORAL_TABLET | ORAL | 0 refills | Status: DC | PRN

## 2016-02-12 MED ORDER — HYDROCODONE-ACETAMINOPHEN 5-325 MG PO TABS
2.0000 | ORAL_TABLET | Freq: Once | ORAL | Status: AC
  Administered 2016-02-12: 2 via ORAL
  Filled 2016-02-12: qty 2

## 2016-02-23 ENCOUNTER — Emergency Department (HOSPITAL_COMMUNITY): Payer: Medicaid Other

## 2016-02-23 ENCOUNTER — Emergency Department (HOSPITAL_COMMUNITY)
Admission: EM | Admit: 2016-02-23 | Discharge: 2016-02-23 | Disposition: A | Discharge status: Home / Self Care | Payer: Medicaid Other | Attending: Emergency Medicine | Admitting: Emergency Medicine

## 2016-02-23 ENCOUNTER — Encounter (HOSPITAL_COMMUNITY): Payer: Self-pay | Admitting: *Deleted

## 2016-02-23 DIAGNOSIS — Z79899 Other long term (current) drug therapy: Secondary | ICD-10-CM | POA: Insufficient documentation

## 2016-02-23 DIAGNOSIS — E039 Hypothyroidism, unspecified: Secondary | ICD-10-CM | POA: Insufficient documentation

## 2016-02-23 DIAGNOSIS — J40 Bronchitis, not specified as acute or chronic: Secondary | ICD-10-CM | POA: Diagnosis not present

## 2016-02-23 DIAGNOSIS — I10 Essential (primary) hypertension: Secondary | ICD-10-CM | POA: Diagnosis not present

## 2016-02-23 DIAGNOSIS — R1013 Epigastric pain: Secondary | ICD-10-CM | POA: Diagnosis present

## 2016-02-23 DIAGNOSIS — F17210 Nicotine dependence, cigarettes, uncomplicated: Secondary | ICD-10-CM | POA: Diagnosis not present

## 2016-02-23 DIAGNOSIS — R091 Pleurisy: Secondary | ICD-10-CM

## 2016-02-23 LAB — COMPREHENSIVE METABOLIC PANEL
ALBUMIN: 4.1 g/dL (ref 3.5–5.0)
ALT: 20 U/L (ref 14–54)
ANION GAP: 9 (ref 5–15)
AST: 20 U/L (ref 15–41)
Alkaline Phosphatase: 57 U/L (ref 38–126)
BILIRUBIN TOTAL: 0.3 mg/dL (ref 0.3–1.2)
BUN: 23 mg/dL — ABNORMAL HIGH (ref 6–20)
CHLORIDE: 104 mmol/L (ref 101–111)
CO2: 22 mmol/L (ref 22–32)
Calcium: 9.2 mg/dL (ref 8.9–10.3)
Creatinine, Ser: 0.73 mg/dL (ref 0.44–1.00)
GFR calc Af Amer: >60 mL/min (ref >60)
GLUCOSE: 108 mg/dL — AB (ref 65–99)
POTASSIUM: 4.2 mmol/L (ref 3.5–5.1)
Sodium: 135 mmol/L (ref 135–145)
TOTAL PROTEIN: 7.5 g/dL (ref 6.5–8.1)

## 2016-02-23 LAB — CBC WITH DIFFERENTIAL/PLATELET
BASOS ABS: 0 10*3/uL (ref 0.0–0.1)
Basophils Relative: 0 %
Eosinophils Absolute: 0.3 10*3/uL (ref 0.0–0.7)
Eosinophils Relative: 2 %
HEMATOCRIT: 43.8 % (ref 36.0–46.0)
Hemoglobin: 14.9 g/dL (ref 12.0–15.0)
LYMPHS ABS: 4.1 10*3/uL — AB (ref 0.7–4.0)
LYMPHS PCT: 28 %
MCH: 32 pg (ref 26.0–34.0)
MCHC: 34 g/dL (ref 30.0–36.0)
MCV: 94 fL (ref 78.0–100.0)
Monocytes Absolute: 1.2 10*3/uL — ABNORMAL HIGH (ref 0.1–1.0)
Monocytes Relative: 8 %
NEUTROS ABS: 9.2 10*3/uL — AB (ref 1.7–7.7)
Neutrophils Relative %: 62 %
Platelets: 237 10*3/uL (ref 150–400)
RBC: 4.66 MIL/uL (ref 3.87–5.11)
RDW: 13.6 % (ref 11.5–15.5)
WBC: 14.8 10*3/uL — AB (ref 4.0–10.5)

## 2016-02-23 LAB — D-DIMER, QUANTITATIVE: D-Dimer, Quant: 0.41 ug/mL-FEU (ref 0.00–0.50)

## 2016-02-23 LAB — LIPASE, BLOOD: Lipase: 32 U/L (ref 11–51)

## 2016-02-23 LAB — TROPONIN I

## 2016-02-23 MED ORDER — KETOROLAC TROMETHAMINE 30 MG/ML IJ SOLN
30.0000 mg | Freq: Once | INTRAMUSCULAR | Status: AC
  Administered 2016-02-23: 30 mg via INTRAVENOUS
  Filled 2016-02-23: qty 1

## 2016-02-23 MED ORDER — AMOXICILLIN 500 MG PO CAPS: 500.0000 mg | ORAL_CAPSULE | Freq: Three times a day (TID) | ORAL | 0 refills | Status: DC

## 2016-02-23 MED ORDER — DEXAMETHASONE SODIUM PHOSPHATE 10 MG/ML IJ SOLN
10.0000 mg | Freq: Once | INTRAMUSCULAR | Status: AC
  Administered 2016-02-23: 10 mg via INTRAVENOUS
  Filled 2016-02-23: qty 1

## 2016-02-23 MED ORDER — CYCLOBENZAPRINE HCL 10 MG PO TABS: 10.0000 mg | ORAL_TABLET | Freq: Three times a day (TID) | ORAL | 0 refills | Status: DC | PRN

## 2016-02-23 MED ORDER — CYCLOBENZAPRINE HCL 10 MG PO TABS
10.0000 mg | ORAL_TABLET | Freq: Once | ORAL | Status: AC
  Administered 2016-02-23: 10 mg via ORAL
  Filled 2016-02-23: qty 1

## 2016-02-23 MED ORDER — PREDNISONE 20 MG PO TABS: ORAL_TABLET | ORAL | 0 refills | Status: DC

## 2016-02-23 NOTE — ED Notes (Signed)
Called into pt's room, pt c/o lower rib cage pain that radiates down abd area, had a sandwich from [employer] for dinner,

## 2016-02-23 NOTE — ED Triage Notes (Addendum)
Pt states that she woke up with bilateral rib cage pain that radiates to back area, pain is worse with palpation of lower rib cage area,

## 2016-02-23 NOTE — Discharge Instructions (Signed)
You can take Mucinex DM over-the-counter for your cough. You just finished a Z-Pak at the end of November. Take the amoxicillin until gone. Take the prednisone until gone, stop the diclofenac while on the prednisone and then restart it. Take the Flexeril for your chest wall pain. You should only be rechecked if you get a high fever

## 2016-02-23 NOTE — ED Notes (Signed)
Patient transported to X-ray 

## 2016-02-23 NOTE — ED Provider Notes (Signed)
[facility] DEPT Provider Note   CSN: [id number] Arrival date & time: 02/23/16  0423  Time seen 05:50 AM    History   Chief Complaint Chief Complaint  Patient presents with  . Abdominal Pain    HPI Teresa Vincent is a 35 y.o. female.  HPI  patient states she was awakened from sleep at 3:30 AM with bilateral rib cage pain going around to her back and epigastric abdominal pain. She states the pain is constant and sharp and stabbing. She states breathing makes it hurt more. She states it is not positional. She denies coughing, shortness of breath, fever, pain or swelling in her extremities. She states she did have one episode of vomiting because of the pain. She denies having this pain before. She states she does not have a history of blood clot problems. She denies any family history of blood clotting problems. The only hormone she states she takes is from an IUD.  PCP Dr Gerda Diss  Past Medical History:  Diagnosis Date  . Abnormal Pap smear of cervix   . ANXIETY 08/30/2007  . GRAVES' DISEASE 12/07/2008  . 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 INSOMNIA 08/30/2007  . MIGRAINE HEADACHE 08/30/2007  . Neck pain   . Stroke [facility])     Patient Active Problem List   Diagnosis Date Noted  . 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/10/2015  . Morbid obesity due to excess calories (HCC) 06/10/2015  . Left-sided low back pain with left-sided sciatica 05/03/2015  . Essential hypertension, benign 12/30/2012  . Esophageal reflux 12/30/2012  . 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 ANXIETY 08/30/2007  . MIGRAINE HEADACHE 08/30/2007  . Insomnia 08/30/2007    Past Surgical History:  Procedure Laterality Date  . CARPAL TUNNEL RELEASE    . LUMBAR EPIDURAL INJECTION    . TUBAL LIGATION  2003    OB History    Gravida Para Term Preterm AB Living   4 1 1   3 1    SAB TAB Ectopic Multiple Live Births                   Home Medications    Prior to  Admission medications   Medication Sig Start Date End Date Taking? Authorizing Provider  albuterol (PROVENTIL HFA;VENTOLIN HFA) 108 (90 Base) MCG/ACT inhaler Inhale 1-2 puffs into the lungs every 4 (four) hours as needed for wheezing or shortness of breath. 01/06/16  Yes [name], MD  ALPRAZolam Prudy Feeler) 0.5 MG tablet TAKE ONE TABLET BY MOUTH AT BEDTIME AS NEEDED FOR SLEEP 09/06/15  Yes Campbell Riches, NP  diclofenac (VOLTAREN) 75 MG EC tablet Take 75 mg by mouth 2 (two) times daily.   Yes Historical Provider, MD  gabapentin (NEURONTIN) 300 MG capsule One po qam, one in the afternoon and 2 po qhs Patient taking differently: Take 300 mg by mouth 4 (four) times daily.  12/09/15  Yes Campbell Riches, NP  HYDROcodone-acetaminophen (NORCO/VICODIN) 5-325 MG tablet Take 2 tablets by mouth every 4 (four) hours as needed. 02/12/16  Yes Lonia Skinner Sofia, PA-C  levothyroxine (SYNTHROID, LEVOTHROID) 175 MCG tablet TAKE ONE TABLET BY MOUTH ONCE DAILY BEFORE BREAKFAST 09/20/15  Yes Roma Kayser, MD  lisinopril (PRINIVIL,ZESTRIL) 10 MG tablet TAKE ONE TABLET BY MOUTH ONCE DAILY 12/09/15  Yes Roma Kayser, MD  oxyCODONE-acetaminophen (PERCOCET) 5-325 MG tablet Take 1-2 tablets by mouth every 4 (four) hours as needed. 02/06/16  Yes Donnetta Hutching, MD  amoxicillin (AMOXIL) 500  MG capsule Take 1 capsule (500 mg total) by mouth 3 (three) times daily. 02/23/16   Devoria AlbeIva Nayzeth Altman, MD  azithromycin (ZITHROMAX) 250 MG tablet Take 1 tablet (250 mg total) by mouth daily. Take first 2 tablets together, then 1 every day until finished. 02/11/16   Elson AreasLeslie K Sofia, PA-C  BIOTIN PO Take 1 capsule by mouth daily.    Historical Provider, MD  cyclobenzaprine (FLEXERIL) 10 MG tablet Take 1 tablet (10 mg total) by mouth 3 (three) times daily as needed (muscle soreness). 02/23/16   Devoria AlbeIva Chaseton Yepiz, MD  predniSONE (DELTASONE) 20 MG tablet Take 3 po QD x 3d , then 2 po QD x 3d then 1 po QD x 3d 02/23/16   Devoria AlbeIva Puneet Selden, MD    Family  History Family History  Problem Relation Age of Onset  . Hypertension Mother   . Thyroid disease Neg Hx     Social History Social History  Substance Use Topics  . Smoking status: Current Every Day Smoker    Packs/day: 1.00    Years: 26.00    Types: Cigarettes    Last attempt to quit: 06/29/2012  . Smokeless tobacco: Never Used  . Alcohol use 0.0 oz/week     Comment: occasional  unemployed    Allergies   Adhesive [tape] and Celexa [citalopram hydrobromide]   Review of Systems Review of Systems  All other systems reviewed and are negative.    Physical Exam Updated Vital Signs BP 150/90   Pulse 78   Temp 97.8 F (36.6 C) (Oral)   Resp 20   LMP 01/31/2016   SpO2 98%   Vital signs normal    Physical Exam  Constitutional: She is oriented to person, place, and time. She appears well-developed and well-nourished.  Non-toxic appearance. She does not appear ill. She appears distressed.  Pacing around the room holding her chest  HENT:  Head: Normocephalic and atraumatic.  Right Ear: External ear normal.  Left Ear: External ear normal.  Nose: Nose normal. No mucosal edema or rhinorrhea.  Mouth/Throat: Oropharynx is clear and moist and mucous membranes are normal. No dental abscesses or uvula swelling.  Eyes: Conjunctivae and EOM are normal. Pupils are equal, round, and reactive to light.  Neck: Normal range of motion and full passive range of motion without pain. Neck supple.  Cardiovascular: Normal rate, regular rhythm and normal heart sounds.  Exam reveals no gallop and no friction rub.   No murmur heard. Pulmonary/Chest: Effort normal and breath sounds normal. No respiratory distress. She has no wheezes. She has no rhonchi. She has no rales. She exhibits no tenderness and no crepitus.  Abdominal: Soft. Normal appearance and bowel sounds are normal. She exhibits no distension. There is tenderness in the epigastric area. There is no rebound and no guarding.   Musculoskeletal: Normal range of motion. She exhibits no edema or tenderness.  Moves all extremities well.   Neurological: She is alert and oriented to person, place, and time. She has normal strength. No cranial nerve deficit.  Skin: Skin is warm, dry and intact. No rash noted. No erythema. No pallor.  Psychiatric: She has a normal mood and affect. Her speech is normal and behavior is normal. Her mood appears not anxious.  Nursing note and vitals reviewed.    ED Treatments / Results  Labs (all labs ordered are listed, but only abnormal results are displayed) Results for orders placed or performed during the hospital encounter of 02/23/16  Comprehensive metabolic panel  Result Value  Ref Range   Sodium 135 135 - 145 mmol/L   Potassium 4.2 3.5 - 5.1 mmol/L   Chloride 104 101 - 111 mmol/L   CO2 22 22 - 32 mmol/L   Glucose, Bld 108 (H) 65 - 99 mg/dL   BUN 23 (H) 6 - 20 mg/dL   Creatinine, Ser 1.61 0.44 - 1.00 mg/dL   Calcium 9.2 8.9 - 09.6 mg/dL   Total Protein 7.5 6.5 - 8.1 g/dL   Albumin 4.1 3.5 - 5.0 g/dL   AST 20 15 - 41 U/L   ALT 20 14 - 54 U/L   Alkaline Phosphatase 57 38 - 126 U/L   Total Bilirubin 0.3 0.3 - 1.2 mg/dL   GFR calc non Af Amer >60 >60 mL/min   GFR calc Af Amer >60 >60 mL/min   Anion gap 9 5 - 15  CBC with Differential  Result Value Ref Range   WBC 14.8 (H) 4.0 - 10.5 K/uL   RBC 4.66 3.87 - 5.11 MIL/uL   Hemoglobin 14.9 12.0 - 15.0 g/dL   HCT 04.5 40.9 - 81.1 %   MCV 94.0 78.0 - 100.0 fL   MCH 32.0 26.0 - 34.0 pg   MCHC 34.0 30.0 - 36.0 g/dL   RDW 91.4 78.2 - 95.6 %   Platelets 237 150 - 400 K/uL   Neutrophils Relative % 62 %   Neutro Abs 9.2 (H) 1.7 - 7.7 K/uL   Lymphocytes Relative 28 %   Lymphs Abs 4.1 (H) 0.7 - 4.0 K/uL   Monocytes Relative 8 %   Monocytes Absolute 1.2 (H) 0.1 - 1.0 K/uL   Eosinophils Relative 2 %   Eosinophils Absolute 0.3 0.0 - 0.7 K/uL   Basophils Relative 0 %   Basophils Absolute 0.0 0.0 - 0.1 K/uL  D-dimer, quantitative   Result Value Ref Range   D-Dimer, Quant 0.41 0.00 - 0.50 ug/mL-FEU  Lipase, blood  Result Value Ref Range   Lipase 32 11 - 51 U/L  Troponin I  Result Value Ref Range   Troponin I <0.03 <0.03 ng/mL    Laboratory interpretation all normal except leukocytosis    EKG  EKG Interpretation  Date/Time:  Wednesday February 23 2016 06:21:57 EST Ventricular Rate:  63 PR Interval:  162 QRS Duration: 88 QT Interval:  414 QTC Calculation: 423 R Axis:   52 Text Interpretation:  Normal sinus rhythm Normal ECG No significant change since last tracing 18 Jun 2014 Confirmed by Zamarion Longest  MD-I, Alazae Crymes ([id number]) on 02/23/2016 6:41:54 AM       Radiology Dg Chest 2 View  Result Date: 02/23/2016 CLINICAL DATA:  Bilateral chest pain EXAM: CHEST  2 VIEW COMPARISON:  02/11/2016 FINDINGS: Normal heart size and mediastinal contours. Obscuration of the lower left heart border is related to probable fat pad based on lateral view and stability. No acute infiltrate or edema. No effusion or pneumothorax. No acute osseous findings. IMPRESSION: No active cardiopulmonary disease. Electronically Signed   By: Marnee Spring M.D.   On: 02/23/2016 07:11     Dg Chest 2 View  Result Date: 02/11/2016 CLINICAL DATA:  36 year old female with pain radiating from the right shoulder to under the right breast for 2 days, worse today. Productive cough since October.. IMPRESSION: No acute cardiopulmonary abnormality. Electronically Signed   By: Odessa Fleming M.D.   On: 02/11/2016 22:00   Procedures Procedures (including critical care time)  Medications Ordered in ED Medications  ketorolac (TORADOL) 30 MG/ML injection 30 mg (  30 mg Intravenous Given 02/23/16 0605)  dexamethasone (DECADRON) injection 10 mg (10 mg Intravenous Given 02/23/16 0707)  cyclobenzaprine (FLEXERIL) tablet 10 mg (10 mg Oral Given 02/23/16 0704)     Initial Impression / Assessment and Plan / ED Course  I have reviewed the triage vital signs and the nursing  notes.  Pertinent labs & imaging results that were available during my care of the patient were reviewed by me and considered in my medical decision making (see chart for details).  Clinical Course    Patient was given IV Toradol for pain. Testing was done to look for evidence of PE, cardiac ischemia, pneumonia, pneumothorax. Although patient states her pain was not improved she was noted to be laying on the stretcher per nursing staff. She was then given Decadron IV and oral Flexeril.  Recheck at discharge patient's lying quietly in bed smiling. She is obviously much improved. We discussed her test results. We discussed putting her on prednisone and she can stop her diclofenac while on the prednisone, and Flexeril. She can take Mucinex DM over-the-counter. We discussed starting antibiotics and she did not tell me she had just finished a course of antibiotics and last 2 weeks. She was then placed on amoxicillin because she'll be has taken a Z-Pak.  Review of the [place] database shows patient has had 8 narcotic prescriptions since June 19, these are from her PCP, our ED, an orthopedist. It is varied between hydrocodone 07/21/23/2010 tablets once, and oxycodone 5/325 multiple prescriptions for #20 in a couple for #30. She also gets alprazolam No. 30 tablets of those 0.5 mg monthly from her PCP.  Final Clinical Impressions(s) / ED Diagnoses   Final diagnoses:  Pleurisy  Bronchitis    New Prescriptions New Prescriptions   AMOXICILLIN (AMOXIL) 500 MG CAPSULE    Take 1 capsule (500 mg total) by mouth 3 (three) times daily.   CYCLOBENZAPRINE (FLEXERIL) 10 MG TABLET    Take 1 tablet (10 mg total) by mouth 3 (three) times daily as needed (muscle soreness).   PREDNISONE (DELTASONE) 20 MG TABLET    Take 3 po QD x 3d , then 2 po QD x 3d then 1 po QD x 3d    Plan discharge  Devoria AlbeIva Axten Pascucci, MD, [facility] PyoFACEP    Rosabell Geyer, MD 02/23/16 [phone]

## 2016-02-23 NOTE — ED Notes (Addendum)
Pt states that the Toradol has not helped with the pain although pt appears more comfortable, pt is able to sit in bed, pt requesting additional pain medication, Dr Lynelle DoctorKnapp notified, no additional orders given,

## 2016-02-23 NOTE — ED Notes (Signed)
ED Provider at bedside. 

## 2016-03-20 ENCOUNTER — Encounter (HOSPITAL_COMMUNITY): Payer: Self-pay | Admitting: *Deleted

## 2016-03-20 ENCOUNTER — Emergency Department (HOSPITAL_COMMUNITY)
Admission: EM | Admit: 2016-03-20 | Discharge: 2016-03-21 | Disposition: A | Discharge status: Home / Self Care | Payer: Medicaid Other | Attending: Emergency Medicine | Admitting: Emergency Medicine

## 2016-03-20 DIAGNOSIS — I10 Essential (primary) hypertension: Secondary | ICD-10-CM | POA: Diagnosis not present

## 2016-03-20 DIAGNOSIS — Z79899 Other long term (current) drug therapy: Secondary | ICD-10-CM | POA: Insufficient documentation

## 2016-03-20 DIAGNOSIS — N200 Calculus of kidney: Secondary | ICD-10-CM | POA: Insufficient documentation

## 2016-03-20 DIAGNOSIS — N23 Unspecified renal colic: Secondary | ICD-10-CM

## 2016-03-20 DIAGNOSIS — K829 Disease of gallbladder, unspecified: Secondary | ICD-10-CM | POA: Diagnosis not present

## 2016-03-20 DIAGNOSIS — Z791 Long term (current) use of non-steroidal anti-inflammatories (NSAID): Secondary | ICD-10-CM | POA: Diagnosis not present

## 2016-03-20 DIAGNOSIS — F17210 Nicotine dependence, cigarettes, uncomplicated: Secondary | ICD-10-CM | POA: Insufficient documentation

## 2016-03-20 DIAGNOSIS — R1031 Right lower quadrant pain: Secondary | ICD-10-CM | POA: Diagnosis present

## 2016-03-20 DIAGNOSIS — E039 Hypothyroidism, unspecified: Secondary | ICD-10-CM | POA: Diagnosis not present

## 2016-03-20 NOTE — ED Triage Notes (Signed)
Pt c/o n/v/d, fever, right side abd and chest pain that started today, took ibuprofen at 6:55pm

## 2016-03-21 ENCOUNTER — Emergency Department (HOSPITAL_COMMUNITY): Payer: Medicaid Other

## 2016-03-21 LAB — CBC WITH DIFFERENTIAL/PLATELET
BASOS ABS: 0 10*3/uL (ref 0.0–0.1)
BASOS PCT: 0 %
EOS ABS: 0 10*3/uL (ref 0.0–0.7)
Eosinophils Relative: 0 %
HCT: 43.2 % (ref 36.0–46.0)
HEMOGLOBIN: 14.6 g/dL (ref 12.0–15.0)
Lymphocytes Relative: 10 %
Lymphs Abs: 0.9 10*3/uL (ref 0.7–4.0)
MCH: 31.3 pg (ref 26.0–34.0)
MCHC: 33.8 g/dL (ref 30.0–36.0)
MCV: 92.7 fL (ref 78.0–100.0)
MONO ABS: 0.3 10*3/uL (ref 0.1–1.0)
MONOS PCT: 4 %
NEUTROS PCT: 86 %
Neutro Abs: 7 10*3/uL (ref 1.7–7.7)
Platelets: 212 10*3/uL (ref 150–400)
RBC: 4.66 MIL/uL (ref 3.87–5.11)
RDW: 13 % (ref 11.5–15.5)
WBC: 8.2 10*3/uL (ref 4.0–10.5)

## 2016-03-21 LAB — URINALYSIS, ROUTINE W REFLEX MICROSCOPIC
BILIRUBIN URINE: NEGATIVE
Bacteria, UA: NONE SEEN
GLUCOSE, UA: NEGATIVE mg/dL
KETONES UR: 20 mg/dL — AB
LEUKOCYTES UA: NEGATIVE
NITRITE: NEGATIVE
PH: 7 (ref 5.0–8.0)
PROTEIN: 30 mg/dL — AB
Specific Gravity, Urine: 1.026 (ref 1.005–1.030)

## 2016-03-21 LAB — COMPREHENSIVE METABOLIC PANEL
ALBUMIN: 4 g/dL (ref 3.5–5.0)
ALK PHOS: 49 U/L (ref 38–126)
ALT: 17 U/L (ref 14–54)
ANION GAP: 9 (ref 5–15)
AST: 19 U/L (ref 15–41)
BILIRUBIN TOTAL: 0.5 mg/dL (ref 0.3–1.2)
BUN: 15 mg/dL (ref 6–20)
CO2: 24 mmol/L (ref 22–32)
Calcium: 9.1 mg/dL (ref 8.9–10.3)
Chloride: 103 mmol/L (ref 101–111)
Creatinine, Ser: 0.76 mg/dL (ref 0.44–1.00)
GFR calc Af Amer: >60 mL/min (ref >60)
GLUCOSE: 121 mg/dL — AB (ref 65–99)
Potassium: 3.6 mmol/L (ref 3.5–5.1)
Sodium: 136 mmol/L (ref 135–145)
TOTAL PROTEIN: 7.1 g/dL (ref 6.5–8.1)

## 2016-03-21 LAB — LIPASE, BLOOD: LIPASE: 26 U/L (ref 11–51)

## 2016-03-21 LAB — POC URINE PREG, ED: Preg Test, Ur: NEGATIVE

## 2016-03-21 MED ORDER — MORPHINE SULFATE (PF) 4 MG/ML IV SOLN
4.0000 mg | Freq: Once | INTRAVENOUS | Status: AC
  Administered 2016-03-21: 4 mg via INTRAVENOUS
  Filled 2016-03-21: qty 1

## 2016-03-21 MED ORDER — TAMSULOSIN HCL 0.4 MG PO CAPS: 0.4000 mg | ORAL_CAPSULE | Freq: Every day | ORAL | 0 refills | Status: DC

## 2016-03-21 MED ORDER — ONDANSETRON HCL 4 MG/2ML IJ SOLN
4.0000 mg | Freq: Once | INTRAMUSCULAR | Status: AC
  Administered 2016-03-21: 4 mg via INTRAVENOUS
  Filled 2016-03-21: qty 2

## 2016-03-21 MED ORDER — PROCHLORPERAZINE EDISYLATE 5 MG/ML IJ SOLN
10.0000 mg | Freq: Once | INTRAMUSCULAR | Status: AC
  Administered 2016-03-21: 10 mg via INTRAVENOUS
  Filled 2016-03-21: qty 2

## 2016-03-21 MED ORDER — HYDROCODONE-ACETAMINOPHEN 7.5-325 MG PO TABS: 1.0000 | ORAL_TABLET | ORAL | 0 refills | Status: DC | PRN

## 2016-03-21 MED ORDER — MELOXICAM 15 MG PO TABS: 15.0000 mg | ORAL_TABLET | Freq: Every day | ORAL | 0 refills | Status: DC

## 2016-03-21 MED ORDER — KETOROLAC TROMETHAMINE 30 MG/ML IJ SOLN
30.0000 mg | Freq: Once | INTRAMUSCULAR | Status: AC
  Administered 2016-03-21: 30 mg via INTRAVENOUS
  Filled 2016-03-21: qty 1

## 2016-03-21 MED ORDER — PROMETHAZINE HCL 25 MG PO TABS: 25.0000 mg | ORAL_TABLET | Freq: Four times a day (QID) | ORAL | 0 refills | Status: DC | PRN

## 2016-03-21 MED ORDER — HYDROMORPHONE HCL 1 MG/ML IJ SOLN
1.0000 mg | Freq: Once | INTRAMUSCULAR | Status: AC
  Administered 2016-03-21: 1 mg via INTRAVENOUS
  Filled 2016-03-21: qty 1

## 2016-03-21 NOTE — ED Provider Notes (Signed)
[facility] DEPT Provider Note   CSN: [id number] Arrival date & time: 03/20/16  2251     History   Chief Complaint Chief Complaint  Patient presents with  . Emesis  . Abdominal Pain    HPI Teresa Vincent is a 36 y.o. female.  Patient is a 36 year old female who presents to the emergency department with a complaint of right-sided abdominal pain and back pain.  Patient states she was in her usual state of health until approximately 2 PM today when she started not feeling well. She began to have vomiting. She noticed that she had a temperature elevation of 101. She had diarrhea accompanied by back pain and chest pain and abdominal pain. The patient then noted that she had multiple episodes of vomiting and the pain in her right lower abdomen area was increasing and radiating into the right lower back area. She states she had no problems when she got up this morning, but after 2:00 she began to feel worse and worse. She has not had any known on contact with anyone ill. She's not had any food that she thought was ill prepared. She's not had any injury or trauma to the abdomen, chest, or back. Is no previous history of similar symptoms. She's not had any blood in the urine, or the stool. She states her appetite has been intact up until 2:00 when she started feeling bad. She presents now for assistance with this particular problem.   The history is provided by the patient.    Past Medical History:  Diagnosis Date  . Abnormal Pap smear of cervix   . ANXIETY 08/30/2007  . GRAVES' DISEASE 12/07/2008  . 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 INSOMNIA 08/30/2007  . MIGRAINE HEADACHE 08/30/2007  . Neck pain   . Stroke [facility])     Patient Active Problem List   Diagnosis Date Noted  . 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/10/2015  . Morbid obesity due to excess calories (HCC) 06/10/2015  . Left-sided low back pain with left-sided sciatica 05/03/2015  . Essential hypertension,  benign 12/30/2012  . Esophageal reflux 12/30/2012  . 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 ANXIETY 08/30/2007  . MIGRAINE HEADACHE 08/30/2007  . Insomnia 08/30/2007    Past Surgical History:  Procedure Laterality Date  . CARPAL TUNNEL RELEASE    . LUMBAR EPIDURAL INJECTION    . TUBAL LIGATION  2003    OB History    Gravida Para Term Preterm AB Living   4 1 1   3 1    SAB TAB Ectopic Multiple Live Births                   Home Medications    Prior to Admission medications   Medication Sig Start Date End Date Taking? Authorizing Provider  albuterol (PROVENTIL HFA;VENTOLIN HFA) 108 (90 Base) MCG/ACT inhaler Inhale 1-2 puffs into the lungs every 4 (four) hours as needed for wheezing or shortness of breath. 01/06/16   [name], MD  ALPRAZolam Prudy Feeler) 0.5 MG tablet TAKE ONE TABLET BY MOUTH AT BEDTIME AS NEEDED FOR SLEEP 09/06/15   Campbell Riches, NP  amoxicillin (AMOXIL) 500 MG capsule Take 1 capsule (500 mg total) by mouth 3 (three) times daily. 02/23/16   Devoria Albe, MD  azithromycin (ZITHROMAX) 250 MG tablet Take 1 tablet (250 mg total) by mouth daily. Take first 2 tablets together, then 1 every day until finished. 02/11/16   Elson Areas, PA-C  BIOTIN PO  Take 1 capsule by mouth daily.    Historical Provider, MD  cyclobenzaprine (FLEXERIL) 10 MG tablet Take 1 tablet (10 mg total) by mouth 3 (three) times daily as needed (muscle soreness). 02/23/16   Devoria Albe, MD  cyclobenzaprine (FLEXERIL) 10 MG tablet Take 1 tablet (10 mg total) by mouth 3 (three) times daily as needed for muscle spasms. 02/23/16   Devoria Albe, MD  diclofenac (VOLTAREN) 75 MG EC tablet Take 75 mg by mouth 2 (two) times daily.    Historical Provider, MD  gabapentin (NEURONTIN) 300 MG capsule One po qam, one in the afternoon and 2 po qhs Patient taking differently: Take 300 mg by mouth 4 (four) times daily.  12/09/15   Campbell Riches, NP  HYDROcodone-acetaminophen (NORCO/VICODIN) 5-325  MG tablet Take 2 tablets by mouth every 4 (four) hours as needed. 02/12/16   Elson Areas, PA-C  levothyroxine (SYNTHROID, LEVOTHROID) 175 MCG tablet TAKE ONE TABLET BY MOUTH ONCE DAILY BEFORE BREAKFAST 09/20/15   Roma Kayser, MD  lisinopril (PRINIVIL,ZESTRIL) 10 MG tablet TAKE ONE TABLET BY MOUTH ONCE DAILY 12/09/15   Roma Kayser, MD  oxyCODONE-acetaminophen (PERCOCET) 5-325 MG tablet Take 1-2 tablets by mouth every 4 (four) hours as needed. 02/06/16   Donnetta Hutching, MD  predniSONE (DELTASONE) 20 MG tablet Take 3 po QD x 3d , then 2 po QD x 3d then 1 po QD x 3d 02/23/16   Devoria Albe, MD  predniSONE (DELTASONE) 20 MG tablet Take 3 po QD x 3d , then 2 po QD x 3d then 1 po QD x 3d 02/23/16   Devoria Albe, MD    Family History Family History  Problem Relation Age of Onset  . Hypertension Mother   . Thyroid disease Neg Hx     Social History Social History  Substance Use Topics  . Smoking status: Current Every Day Smoker    Packs/day: 1.00    Years: 26.00    Types: Cigarettes    Last attempt to quit: 06/29/2012  . Smokeless tobacco: Never Used  . Alcohol use 0.0 oz/week     Comment: occasional     Allergies   Adhesive [tape] and Celexa [citalopram hydrobromide]   Review of Systems Review of Systems  Constitutional: Positive for appetite change, chills and fever. Negative for activity change.       All ROS Neg except as noted in HPI  HENT: Negative for nosebleeds.   Eyes: Negative for photophobia and discharge.  Respiratory: Negative for cough, shortness of breath and wheezing.   Cardiovascular: Negative for chest pain and palpitations.  Gastrointestinal: Positive for abdominal pain, nausea and vomiting. Negative for blood in stool.  Genitourinary: Negative for dysuria, frequency and hematuria.  Musculoskeletal: Positive for back pain. Negative for arthralgias and neck pain.  Skin: Negative.   Neurological: Negative for dizziness, seizures and speech difficulty.    Psychiatric/Behavioral: Negative for confusion and hallucinations.     Physical Exam Updated Vital Signs BP 116/65 (BP Location: Right Arm)   Pulse 102   Temp 98.5 F (36.9 C) (Tympanic)   Resp 18   Ht 5\' 7"  (1.702 m)   Wt 101.2 kg   LMP 02/25/2016 Comment: irregular periods  SpO2 92%   BMI 34.93 kg/m   Physical Exam  Constitutional: She is oriented to person, place, and time. She appears well-developed and well-nourished.  Non-toxic appearance.  HENT:  Head: Normocephalic.  Right Ear: Tympanic membrane and external ear normal.  Left Ear: Tympanic  membrane and external ear normal.  Nasal congestion present.  Eyes: EOM and lids are normal. Pupils are equal, round, and reactive to light.  Neck: Normal range of motion. Neck supple. Carotid bruit is not present.  Cardiovascular: Normal rate, regular rhythm, normal heart sounds, intact distal pulses and normal pulses.   Pulmonary/Chest: Breath sounds normal. No respiratory distress.  Abdominal: Soft. Bowel sounds are normal. There is no tenderness. There is no guarding.  Right CVA tenderness. Minimal right upper quadrant soreness.  Suprapubic and right lower quadrant area tenderness.  Musculoskeletal: Normal range of motion.  Pain to palpation of the right lower paraspinal muscles involving the lower lumbar area.  Lymphadenopathy:       Head (right side): No submandibular adenopathy present.       Head (left side): No submandibular adenopathy present.    She has no cervical adenopathy.  Neurological: She is alert and oriented to person, place, and time. She has normal strength. No cranial nerve deficit or sensory deficit.  Skin: Skin is warm and dry.  Psychiatric: She has a normal mood and affect. Her speech is normal.  Nursing note and vitals reviewed.    ED Treatments / Results  Labs (all labs ordered are listed, but only abnormal results are displayed) Labs Reviewed  URINALYSIS, ROUTINE W REFLEX MICROSCOPIC -  Abnormal; Notable for the following:       Result Value   Hgb urine dipstick SMALL (*)    Ketones, ur 20 (*)    Protein, ur 30 (*)    All other components within normal limits  COMPREHENSIVE METABOLIC PANEL - Abnormal; Notable for the following:    Glucose, Bld 121 (*)    All other components within normal limits  LIPASE, BLOOD  CBC WITH DIFFERENTIAL/PLATELET  POC URINE PREG, ED    EKG  EKG Interpretation None       Radiology Ct Renal Stone Study  Result Date: 03/21/2016 CLINICAL DATA:  RIGHT flank pain for a few days. History of low back pain and sciatica. EXAM: CT ABDOMEN AND PELVIS WITHOUT CONTRAST TECHNIQUE: Multidetector CT imaging of the abdomen and pelvis was performed following the standard protocol without IV contrast. COMPARISON:  None. FINDINGS: LOWER CHEST: Dependent atelectasis. The visualized heart size is normal. No pericardial effusion. HEPATOBILIARY: The liver is normal. Minimal layering density within the gallbladder. No gallbladder wall thickening or pericholecystic fluid. PANCREAS: Normal. SPLEEN: Normal. ADRENALS/URINARY TRACT: Kidneys are orthotopic, demonstrating normal size and morphology. 4 mm RIGHT interpolar, punctate RIGHT lower pole, 2 mm LEFT lower pole nephrolithiasis. 2.9 cm exophytic RIGHT interpolar renal cyst. No hydronephrosis; limited assessment for renal masses on this nonenhanced examination. The unopacified ureters are normal in course and caliber. Urinary bladder is partially distended and unremarkable. Normal adrenal glands. STOMACH/BOWEL: The stomach, small and large bowel are normal in course and caliber without inflammatory changes, sensitivity decreased by lack of enteric contrast. Normal appendix. VASCULAR/LYMPHATIC: Aortoiliac vessels are normal in course and caliber, trace calcific atherosclerosis. No lymphadenopathy by CT size criteria. Multiple small mesenteric lymph nodes are likely reactive. REPRODUCTIVE: IUD malrotated, within the lower  uterine segment. Tubal ligation clips noted. OTHER: No intraperitoneal free fluid or free air. MUSCULOSKELETAL: Non-acute. 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 Multiple chronic Schmorl's nodes. Small fat containing umbilical hernia. IMPRESSION: Bilateral nonobstructing nephrolithiasis measuring up to 4 mm. Gallbladder sludge versus layering stones, no CT findings of acute cholecystitis. Malpositioned IUD. Electronically Signed   By: Awilda Metro M.D.   On:  03/21/2016 01:27    Procedures Procedures (including critical care time)  Medications Ordered in ED Medications  ketorolac (TORADOL) 30 MG/ML injection 30 mg (not administered)  HYDROmorphone (DILAUDID) injection 1 mg (not administered)  ondansetron (ZOFRAN) injection 4 mg (not administered)  prochlorperazine (COMPAZINE) injection 10 mg (10 mg Intravenous Given 03/21/16 0022)  morphine 4 MG/ML injection 4 mg (4 mg Intravenous Given 03/21/16 0022)     Initial Impression / Assessment and Plan / ED Course  I have reviewed the triage vital signs and the nursing notes.  Pertinent labs & imaging results that were available during my care of the patient were reviewed by me and considered in my medical decision making (see chart for details).  Clinical Course     **I have reviewed nursing notes, vital signs, and all appropriate lab and imaging results for this patient.*  Final Clinical Impressions(s) / ED Diagnoses  Patient received IV pain medicine and nausea medicine. The nausea has resolved, but the pain medication only lasted a short period of time.  Vital signs reviewed.  The patient has a negative pregnancy test. The urine analysis shows a clear yellow specimen with a specific gravity 1.026. There is a small amount of hemoglobin present. The nitrates are negative, the leukocyte esterase is also negative. There are too numerous to count red blood cells present. The competence of metabolic panel  shows a glucose to be slightly elevated at 121, otherwise within normal limits. Lipase is normal at 26. Complete blood count is normal. CT stone study reveals a malrotated IUD in the lower uterine segment. There is noted on bilateral nonobstructing nephrolithiasis measuring up to 4 mm. There is some sludge in the gallbladder versus stones present, but no CT evidence of acute cholecystitis.  I discussed these findings with the patient in terms which he understands. The patient will receive additional pain medication. She will strain all urine. She will be referred to urology concerning stones on the right and the left. Prescription for meloxicam and Norco given to the patient. Patient strongly encouraged to see her physician to have the IUD removed. I've also discussed with the patient the importance of having her doctor monitored the sludge/stones in her gallbladder. Questions were answered. Patient is in agreement with this discharge plan.    Final diagnoses:  None    New Prescriptions New Prescriptions   No medications on file     Ivery QualeHobson Tyrika Newman, PA-C 03/21/16 [id number]    Shon Batonourtney F Horton, MD 03/21/16 [phone]

## 2016-03-21 NOTE — Discharge Instructions (Signed)
Your CT scan suggests kidney stones on the right and on the left. Please strain all urine. If you should pass the stones, please preserve them and allow your urology specialist to analyze them. Please call Dr. Annabell HowellsWrenn at [facility] [place] in [place] for evaluation concerning your right and left kidney stones. Your scan also shows sludge, and or stones in your gallbladder. Please discuss this with Dr.Luking. The two of you may want to consult a general surgeon concerning this problem. Please use meloxicam daily with food. Use Norco every 4 hours as needed for pain. Please return to the emergency department if there is uncontrolled vomiting or uncontrolled pain related to your kidney stones.

## 2016-03-21 NOTE — ED Notes (Signed)
Pt transported to xray 

## 2016-03-21 NOTE — ED Notes (Signed)
Pt returned from xray

## 2016-03-22 ENCOUNTER — Ambulatory Visit: Payer: Medicaid Other | Admitting: "Endocrinology

## 2016-03-23 ENCOUNTER — Other Ambulatory Visit: Payer: Self-pay | Admitting: "Endocrinology

## 2016-03-23 DIAGNOSIS — I10 Essential (primary) hypertension: Secondary | ICD-10-CM

## 2016-03-23 DIAGNOSIS — E890 Postprocedural hypothyroidism: Secondary | ICD-10-CM

## 2016-03-23 LAB — TSH: TSH: 0.06 m[IU]/L — AB

## 2016-03-23 LAB — URINE CULTURE: SPECIAL REQUESTS: NORMAL

## 2016-03-23 LAB — T4, FREE: FREE T4: 1.7 ng/dL (ref 0.8–1.8)

## 2016-03-23 LAB — COMPREHENSIVE METABOLIC PANEL
ALT: 12 U/L (ref 6–29)
AST: 15 U/L (ref 10–30)
Albumin: 3.8 g/dL (ref 3.6–5.1)
Alkaline Phosphatase: 44 U/L (ref 33–115)
BUN: 15 mg/dL (ref 7–25)
CHLORIDE: 106 mmol/L (ref 98–110)
CO2: 22 mmol/L (ref 20–31)
Calcium: 8.9 mg/dL (ref 8.6–10.2)
Creat: 0.62 mg/dL (ref 0.50–1.10)
Glucose, Bld: 95 mg/dL (ref 65–99)
POTASSIUM: 4.1 mmol/L (ref 3.5–5.3)
Sodium: 140 mmol/L (ref 135–146)
Total Bilirubin: 0.2 mg/dL (ref 0.2–1.2)
Total Protein: 6.2 g/dL (ref 6.1–8.1)

## 2016-03-23 NOTE — Progress Notes (Signed)
Order sent to [facility]

## 2016-03-31 ENCOUNTER — Ambulatory Visit (INDEPENDENT_AMBULATORY_CARE_PROVIDER_SITE_OTHER): Payer: Medicaid Other | Admitting: Nurse Practitioner

## 2016-03-31 ENCOUNTER — Encounter: Payer: Self-pay | Admitting: Nurse Practitioner

## 2016-03-31 VITALS — BP 122/72 | HR 87 | Temp 99.2°F | Ht 67.0 in | Wt 224.4 lb

## 2016-03-31 DIAGNOSIS — N200 Calculus of kidney: Secondary | ICD-10-CM | POA: Diagnosis not present

## 2016-03-31 MED ORDER — HYDROCODONE-ACETAMINOPHEN 7.5-325 MG PO TABS: 1.0000 | ORAL_TABLET | ORAL | 0 refills | Status: DC | PRN

## 2016-03-31 MED ORDER — DICLOFENAC SODIUM 75 MG PO TBEC: 75.0000 mg | DELAYED_RELEASE_TABLET | Freq: Two times a day (BID) | ORAL | 0 refills | Status: DC

## 2016-04-01 ENCOUNTER — Encounter: Payer: Self-pay | Admitting: Nurse Practitioner

## 2016-04-01 NOTE — Progress Notes (Addendum)
Subjective:  Presents for follow up after ED visit on 1/1 for renal stones. Off/on pain since. Straining urine. No pain yesterday but began having left flank pain around 5 am today with an episode of vomiting due to intense pain. Takes Lortab 7.5 and Ibuprofen to take the edge off severe pain. Taking fluids well. No fever or dysuria. Pinpoint blood in urine at times. Having hysterectomy on 2/7.   Objective:   BP 122/72   Pulse 87   Temp 99.2 F (37.3 C) (Oral)   Ht 5\' 7"  (1.702 m)   Wt 224 lb 6.4 oz (101.8 kg)   LMP 02/25/2016 Comment: irregular periods  BMI 35.15 kg/m  NAD. Alert, oriented. Frequently adjusting position due to pain. No CVA tenderness. Localized left flank tenderness and generalized pelvic area tenderness.   Assessment:  Renal stones - Plan: Ambulatory referral to Urology    Plan:  Meds ordered this encounter  Medications  . diclofenac (VOLTAREN) 75 MG EC tablet    Sig: Take 1 tablet (75 mg total) by mouth 2 (two) times daily.    Dispense:  30 tablet    Refill:  0    Order Specific Question:   Supervising Provider    Answer:   Merlyn AlbertLUKING, WILLIAM S [[id number]]  . HYDROcodone-acetaminophen (NORCO) 7.5-325 MG tablet    Sig: Take 1 tablet by mouth every 4 (four) hours as needed.    Dispense:  30 tablet    Refill:  0    Order Specific Question:   Supervising Provider    Answer:   Merlyn AlbertLUKING, WILLIAM S [[id number]]   Urgent referral to urology. Call or go to ED in the meantime if needed. Use pain med sparingly.   NCCSR reviewed.Over 50% of this visit was spent in consultation and discussion.

## 2016-04-03 ENCOUNTER — Encounter: Payer: Self-pay | Admitting: Family Medicine

## 2016-04-06 ENCOUNTER — Emergency Department (HOSPITAL_COMMUNITY)
Admission: EM | Admit: 2016-04-06 | Discharge: 2016-04-06 | Disposition: A | Discharge status: Home / Self Care | Payer: Medicaid Other | Attending: Emergency Medicine | Admitting: Emergency Medicine

## 2016-04-06 ENCOUNTER — Encounter (HOSPITAL_COMMUNITY): Payer: Self-pay

## 2016-04-06 DIAGNOSIS — E039 Hypothyroidism, unspecified: Secondary | ICD-10-CM | POA: Insufficient documentation

## 2016-04-06 DIAGNOSIS — I10 Essential (primary) hypertension: Secondary | ICD-10-CM | POA: Insufficient documentation

## 2016-04-06 DIAGNOSIS — Z79899 Other long term (current) drug therapy: Secondary | ICD-10-CM | POA: Diagnosis not present

## 2016-04-06 DIAGNOSIS — R102 Pelvic and perineal pain: Secondary | ICD-10-CM | POA: Diagnosis present

## 2016-04-06 DIAGNOSIS — F17210 Nicotine dependence, cigarettes, uncomplicated: Secondary | ICD-10-CM | POA: Diagnosis not present

## 2016-04-06 DIAGNOSIS — N938 Other specified abnormal uterine and vaginal bleeding: Secondary | ICD-10-CM | POA: Insufficient documentation

## 2016-04-06 LAB — URINALYSIS, MICROSCOPIC (REFLEX)
Squamous Epithelial / LPF: NONE SEEN
WBC UA: NONE SEEN WBC/hpf (ref 0–5)

## 2016-04-06 LAB — WET PREP, GENITAL
Clue Cells Wet Prep HPF POC: NONE SEEN
SPERM: NONE SEEN
TRICH WET PREP: NONE SEEN
WBC, Wet Prep HPF POC: NONE SEEN
YEAST WET PREP: NONE SEEN

## 2016-04-06 LAB — URINALYSIS, ROUTINE W REFLEX MICROSCOPIC
Bilirubin Urine: NEGATIVE
GLUCOSE, UA: NEGATIVE mg/dL
Ketones, ur: NEGATIVE mg/dL
LEUKOCYTES UA: NEGATIVE
Nitrite: NEGATIVE
PROTEIN: 30 mg/dL — AB
SPECIFIC GRAVITY, URINE: 1.02 (ref 1.005–1.030)
pH: 8.5 — ABNORMAL HIGH (ref 5.0–8.0)

## 2016-04-06 MED ORDER — OXYCODONE-ACETAMINOPHEN 5-325 MG PO TABS
1.0000 | ORAL_TABLET | Freq: Once | ORAL | Status: AC
  Administered 2016-04-06: 1 via ORAL
  Filled 2016-04-06: qty 1

## 2016-04-06 NOTE — ED Provider Notes (Signed)
[facility]-EMERGENCY DEPT Provider Note   CSN: [id number] Arrival date & time: 04/06/16  1022     History   Chief Complaint Chief Complaint  Patient presents with  . Vaginal Bleeding  . Pelvic Pain    HPI Teresa Vincent is a 36 y.o. female.  HPI  36 y.o. female, presents to the Emergency Department today complaining of pelvic pain/vaginal bleeding. Noted Pelvic Pain that has worsened since placement of IUD on October 23rd. Told on 03-20-16 that IUD was malrotated based on CT scan for eval of renal stones. Unable to get appointment with OBGYN at [facility]. Notes vaginal bleeding x 2 days. Last mentrual last month. No discharge. No CP/SOB/ABD pain. No dysuria. Notes mainly pelvic pain. No fevers. No other symptoms noted.   Past Medical History:  Diagnosis Date  . Abnormal Pap smear of cervix   . ANXIETY 08/30/2007  . GRAVES' DISEASE 12/07/2008  . 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 INSOMNIA 08/30/2007  . MIGRAINE HEADACHE 08/30/2007  . Neck pain   . Renal disorder    kidney stone  . Stroke [facility])     Patient Active Problem List   Diagnosis Date Noted  . 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/10/2015  . Morbid obesity due to excess calories (HCC) 06/10/2015  . Left-sided low back pain with left-sided sciatica 05/03/2015  . Essential hypertension, benign 12/30/2012  . Esophageal reflux 12/30/2012  . 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 ANXIETY 08/30/2007  . MIGRAINE HEADACHE 08/30/2007  . Insomnia 08/30/2007    Past Surgical History:  Procedure Laterality Date  . CARPAL TUNNEL RELEASE    . LUMBAR EPIDURAL INJECTION    . TUBAL LIGATION  2003    OB History    Gravida Para Term Preterm AB Living   4 1 1   3 1    SAB TAB Ectopic Multiple Live Births                   Home Medications    Prior to Admission medications   Medication Sig Start Date End Date Taking? Authorizing Provider  HYDROcodone-acetaminophen (NORCO) 7.5-325 MG  tablet Take 1 tablet by mouth every 4 (four) hours as needed. Patient taking differently: Take 1 tablet by mouth every 4 (four) hours as needed for moderate pain.  03/31/16  Yes Campbell Riches, NP  levothyroxine (SYNTHROID, LEVOTHROID) 175 MCG tablet TAKE ONE TABLET BY MOUTH ONCE DAILY BEFORE BREAKFAST 09/20/15  Yes Roma Kayser, MD  lisinopril (PRINIVIL,ZESTRIL) 10 MG tablet TAKE ONE TABLET BY MOUTH ONCE DAILY 12/09/15  Yes Roma Kayser, MD  promethazine (PHENERGAN) 25 MG tablet Take 1 tablet (25 mg total) by mouth every 6 (six) hours as needed for nausea or vomiting. 03/21/16  Yes Ivery Quale, PA-C  albuterol (PROVENTIL HFA;VENTOLIN HFA) 108 (90 Base) MCG/ACT inhaler Inhale 1-2 puffs into the lungs every 4 (four) hours as needed for wheezing or shortness of breath. 01/06/16   [name], MD  ALPRAZolam Prudy Feeler) 0.5 MG tablet TAKE ONE TABLET BY MOUTH AT BEDTIME AS NEEDED FOR SLEEP 09/06/15   Campbell Riches, NP  cyclobenzaprine (FLEXERIL) 10 MG tablet Take 1 tablet (10 mg total) by mouth 3 (three) times daily as needed (muscle soreness). Patient not taking: Reported on 04/06/2016 02/23/16   Devoria Albe, MD  cyclobenzaprine (FLEXERIL) 10 MG tablet Take 1 tablet (10 mg total) by mouth 3 (three) times daily as needed for muscle spasms. 02/23/16   Devoria Albe, MD  diclofenac (VOLTAREN) 75 MG EC tablet Take 1 tablet (75 mg total) by mouth 2 (two) times daily. 03/31/16   Campbell Richesarolyn C Hoskins, NP  gabapentin (NEURONTIN) 300 MG capsule One po qam, one in the afternoon and 2 po qhs Patient not taking: Reported on 04/06/2016 12/09/15   Campbell Richesarolyn C Hoskins, NP  oxyCODONE-acetaminophen (PERCOCET) 5-325 MG tablet Take 1-2 tablets by mouth every 4 (four) hours as needed. Patient taking differently: Take 1-2 tablets by mouth every 4 (four) hours as needed for moderate pain.  02/06/16   Donnetta HutchingBrian Cook, MD  tamsulosin (FLOMAX) 0.4 MG CAPS capsule Take 1 capsule (0.4 mg total) by mouth daily. Patient not taking:  Reported on 04/06/2016 03/21/16   Ivery QualeHobson Bryant, PA-C    Family History Family History  Problem Relation Age of Onset  . Hypertension Mother   . Thyroid disease Neg Hx     Social History Social History  Substance Use Topics  . Smoking status: Current Every Day Smoker    Packs/day: 1.00    Years: 26.00    Types: Cigarettes    Last attempt to quit: 06/29/2012  . Smokeless tobacco: Never Used  . Alcohol use 0.0 oz/week     Comment: occasional     Allergies   Adhesive [tape] and Celexa [citalopram hydrobromide]   Review of Systems Review of Systems ROS reviewed and all are negative for acute change except as noted in the HPI.  Physical Exam Updated Vital Signs BP 135/88   Pulse 104   Temp 98.4 F (36.9 C) (Oral)   Resp 18   Ht 5\' 7"  (1.702 m)   Wt 102.1 kg   LMP 02/23/2016 Comment: irregular periods  SpO2 98%   BMI 35.24 kg/m   Physical Exam  Constitutional: She is oriented to person, place, and time. Vital signs are normal. She appears well-developed and well-nourished.  HENT:  Head: Normocephalic and atraumatic.  Right Ear: Hearing normal.  Left Ear: Hearing normal.  Eyes: Conjunctivae and EOM are normal. Pupils are equal, round, and reactive to light.  Neck: Normal range of motion. Neck supple.  Cardiovascular: Normal rate, regular rhythm, normal heart sounds and intact distal pulses.   Pulmonary/Chest: Effort normal.  Abdominal: There is no tenderness. There is no rigidity, no rebound, no guarding, no CVA tenderness, no tenderness at McBurney's point and negative Murphy's sign.  Genitourinary:  Genitourinary Comments: Chaperone present. See exam below. IUD string noted from cervix  Musculoskeletal: Normal range of motion.  Neurological: She is alert and oriented to person, place, and time.  Skin: Skin is warm and dry.  Psychiatric: She has a normal mood and affect. Her speech is normal and behavior is normal. Thought content normal.  Nursing note and vitals  reviewed.  Exam performed by Eston Estersyler M Darnice Comrie,  exam chaperoned Date: 04/06/2016 Pelvic exam: normal external genitalia without evidence of trauma. VULVA: normal appearing vulva with no masses, tenderness or lesion. VAGINA: normal appearing vagina with normal color and discharge, no lesions. CERVIX: normal appearing cervix without lesions, cervical motion tenderness absent, cervical os closed with out purulent discharge; vaginal discharge - bloody, Wet prep and DNA probe for chlamydia and GC obtained.   ADNEXA: normal adnexa in size, nontender and no masses UTERUS: uterus is normal size, shape, consistency and nontender.   ED Treatments / Results  Labs (all labs ordered are listed, but only abnormal results are displayed) Labs Reviewed  URINALYSIS, ROUTINE W REFLEX MICROSCOPIC - Abnormal; Notable for the following:  Result Value   Color, Urine RED (*)    APPearance CLOUDY (*)    pH 8.5 (*)    Hgb urine dipstick LARGE (*)    Protein, ur 30 (*)    All other components within normal limits  URINALYSIS, MICROSCOPIC (REFLEX) - Abnormal; Notable for the following:    Bacteria, UA FEW (*)    All other components within normal limits  WET PREP, GENITAL  GC/CHLAMYDIA PROBE AMP ([place]) NOT AT [facility]    EKG  EKG Interpretation None       Radiology No results found.  Procedures Procedures (including critical care time)  Medications Ordered in ED Medications - No data to display   Initial Impression / Assessment and Plan / ED Course  I have reviewed the triage vital signs and the nursing notes.  Pertinent labs & imaging results that were available during my care of the patient were reviewed by me and considered in my medical decision making (see chart for details).    Final Clinical Impressions(s) / ED Diagnoses  {I have reviewed and evaluated the relevant laboratory values.   {I have reviewed the relevant previous healthcare records.  {I obtained HPI from historian.    ED Course:  Assessment: Pt is a 35yF who presents with pelvic pain since Oct 23rd since IUD placement. Told on 1st of January this year that IUD malrotated. Unable to get appointment with OBGYN at [facility]. Scheduled Hysterectomy on Feb 7th. Requesting IUD removal. On exam, pt in NAD. Nontoxic/nonseptic appearing. VSS. Afebrile. Lungs CTA. Heart RRR. Abdomen nontender soft. GU Exam with blood in cervical canal from cervix itself. String noted from IUD. No Adnexal. No CMT. No perforations noted on exam. No urine noted in vaginal canal as patient worried about. Wet prep unremarkable. GC obtained. UA unremarkable. Blood noted in urine, but patient on menstrual cycle. No signs of infection. Doubt PID. Consulted with OBGYN on call. Unlikely that IUD is not aligned correctly. String intact. 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 Plan is to DC home with close follow up to OBGYN for removal. Pt is seen at [facility] for GYN. Discussed strict return precautions. Counseled patient not to remove IUD herself and to have specialist remove device. At time of discharge, Patient is in no acute distress. Vital Signs are stable. Patient is able to ambulate. Patient able to tolerate PO.    Disposition/Plan:  DC Home Additional Verbal discharge instructions given and discussed with patient.  Pt Instructed to f/u with OBGYN in the next week for evaluation and treatment of symptoms. Return precautions given Pt acknowledges and agrees with plan  Supervising Physician Samuel Jester, DO  Final diagnoses:  Pelvic pain in female    New Prescriptions New Prescriptions   No medications on file     Audry Pili, PA-C 04/06/16 1505    Samuel Jester, DO 04/09/16 1758

## 2016-04-06 NOTE — ED Triage Notes (Signed)
Pt reports had IUD put in Oct 23.  Pt says has had pelvic pain and vaginal bleeding since then.  Pt says she came here for evaluation of kidney stones and says that she was told her iud was in the wrong place.   Pt says she has been trying to get an appt with the ob that put it in but hasn't been able to.

## 2016-04-06 NOTE — Discharge Instructions (Signed)
Please read and follow all provided instructions.  Your diagnoses today include:  1. Pelvic pain in female     Tests performed today include: Vital signs. See below for your results today.   Medications prescribed:  Take as prescribed   Home care instructions:  Follow any educational materials contained in this packet.  Follow-up instructions: Please follow-up with your OBGYN for further evaluation of symptoms and treatment   Return instructions:  Please return to the Emergency Department if you do not get better, if you get worse, or new symptoms OR  - Fever (temperature greater than 101.34F)  - Bleeding that does not stop with holding pressure to the area    -Severe pain (please note that you may be more sore the day after your accident)  - Chest Pain  - Difficulty breathing  - Severe nausea or vomiting  - Inability to tolerate food and liquids  - Passing out  - Skin becoming red around your wounds  - Change in mental status (confusion or lethargy)  - New numbness or weakness    Please return if you have any other emergent concerns.  Additional Information:  Your vital signs today were: BP 135/88    Pulse 104    Temp 98.4 F (36.9 C) (Oral)    Resp 18    Ht 5\' 7"  (1.702 m)    Wt 102.1 kg    LMP 02/23/2016 Comment: irregular periods   SpO2 98%    BMI 35.24 kg/m  If your blood pressure (BP) was elevated above 135/85 this visit, please have this repeated by your doctor within one month. ---------------

## 2016-04-06 NOTE — ED Notes (Signed)
Pelvic set-up at bedside. In and out cath at bedside.

## 2016-04-06 NOTE — ED Notes (Signed)
Urine has blood in it. PA aware.

## 2016-04-07 LAB — GC/CHLAMYDIA PROBE AMP (~~LOC~~) NOT AT ARMC
CHLAMYDIA, DNA PROBE: NEGATIVE
NEISSERIA GONORRHEA: NEGATIVE

## 2016-04-11 ENCOUNTER — Encounter: Payer: Self-pay | Admitting: "Endocrinology

## 2016-04-11 ENCOUNTER — Ambulatory Visit (INDEPENDENT_AMBULATORY_CARE_PROVIDER_SITE_OTHER): Payer: Medicaid Other | Admitting: "Endocrinology

## 2016-04-11 VITALS — BP 117/82 | HR 93 | Ht 67.0 in | Wt 227.0 lb

## 2016-04-11 DIAGNOSIS — I10 Essential (primary) hypertension: Secondary | ICD-10-CM

## 2016-04-11 DIAGNOSIS — E890 Postprocedural hypothyroidism: Secondary | ICD-10-CM

## 2016-04-11 MED ORDER — LEVOTHYROXINE SODIUM 150 MCG PO TABS: ORAL_TABLET | ORAL | 6 refills | Status: DC

## 2016-04-11 NOTE — Progress Notes (Signed)
Subjective:    Patient ID: Teresa Vincent, female    DOB: 1981/02/16, PCP Mickie Hillier, MD   Past Medical History:  Diagnosis Date  . Abnormal Pap smear of cervix   . ANXIETY 08/30/2007  . GRAVES' DISEASE 12/07/2008  . 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 INSOMNIA 08/30/2007  . MIGRAINE HEADACHE 08/30/2007  . Neck pain   . Renal disorder    kidney stone  . Stroke [facility])    Past Surgical History:  Procedure Laterality Date  . CARPAL TUNNEL RELEASE    . LUMBAR EPIDURAL INJECTION    . TUBAL LIGATION  2003   Social History   Social History  . Marital status: Divorced    Spouse name: N/A  . Number of children: N/A  . Years of education: N/A   Occupational History  . Doesn't work outside the home    Social History Main Topics  . Smoking status: Current Every Day Smoker    Packs/day: 1.00    Years: 26.00    Types: Cigarettes    Last attempt to quit: 06/29/2012  . Smokeless tobacco: Never Used  . Alcohol use 0.0 oz/week     Comment: occasional  . Drug use: No  . Sexual activity: Not Asked   Other Topics Concern  . None   Social History Narrative  . None   Outpatient Encounter Prescriptions as of 04/11/2016  Medication Sig  . amoxicillin (AMOXIL) 500 MG tablet Take 500 mg by mouth 3 (three) times daily.  Marland Kitchen albuterol (PROVENTIL HFA;VENTOLIN HFA) 108 (90 Base) MCG/ACT inhaler Inhale 1-2 puffs into the lungs every 4 (four) hours as needed for wheezing or shortness of breath.  . ALPRAZolam (XANAX) 0.5 MG tablet TAKE ONE TABLET BY MOUTH AT BEDTIME AS NEEDED FOR SLEEP  . cyclobenzaprine (FLEXERIL) 10 MG tablet Take 1 tablet (10 mg total) by mouth 3 (three) times daily as needed for muscle spasms.  . diclofenac (VOLTAREN) 75 MG EC tablet Take 1 tablet (75 mg total) by mouth 2 (two) times daily.  Marland Kitchen HYDROcodone-acetaminophen (NORCO) 7.5-325 MG tablet Take 1 tablet by mouth every 4 (four) hours as needed. (Patient taking differently: Take 1 tablet by mouth every 4  (four) hours as needed for moderate pain. )  . levothyroxine (SYNTHROID, LEVOTHROID) 150 MCG tablet TAKE ONE TABLET BY MOUTH ONCE DAILY BEFORE BREAKFAST  . lisinopril (PRINIVIL,ZESTRIL) 10 MG tablet TAKE ONE TABLET BY MOUTH ONCE DAILY  . oxyCODONE-acetaminophen (PERCOCET) 5-325 MG tablet Take 1-2 tablets by mouth every 4 (four) hours as needed. (Patient taking differently: Take 1-2 tablets by mouth every 4 (four) hours as needed for moderate pain. )  . promethazine (PHENERGAN) 25 MG tablet Take 1 tablet (25 mg total) by mouth every 6 (six) hours as needed for nausea or vomiting.  . [DISCONTINUED] cyclobenzaprine (FLEXERIL) 10 MG tablet Take 1 tablet (10 mg total) by mouth 3 (three) times daily as needed (muscle soreness). (Patient not taking: Reported on 04/06/2016)  . [DISCONTINUED] gabapentin (NEURONTIN) 300 MG capsule One po qam, one in the afternoon and 2 po qhs (Patient not taking: Reported on 04/06/2016)  . [DISCONTINUED] levothyroxine (SYNTHROID, LEVOTHROID) 175 MCG tablet TAKE ONE TABLET BY MOUTH ONCE DAILY BEFORE BREAKFAST  . [DISCONTINUED] tamsulosin (FLOMAX) 0.4 MG CAPS capsule Take 1 capsule (0.4 mg total) by mouth daily. (Patient not taking: Reported on 04/06/2016)   No facility-administered encounter medications on file as of 04/11/2016.    ALLERGIES: Allergies  Allergen Reactions  . Adhesive [Tape]  Other (See Comments)    Skin irritation, patient prefers paper tape.   Sheliah Hatch [Citalopram Hydrobromide] Other (See Comments)    Make patient too carefree   VACCINATION STATUS: Immunization History  Administered Date(s) Administered  . Influenza-Unspecified 12/11/2015  . Td 05/11/2006    HPI  Teresa Vincent is a 36 year old female patient with medical history as above. She is here to follow-up for hypothyroidism. Her history is significant for treatment for Graves' disease with RAI on 01/06/2009. She was treated with various doses of levothyroxine over the years currently at 175 g  by mouth every morning. She has been more consistent in taking her thyroid hormone. She has no new complaints.  - She has gained significant amount of weight since last visit. She is being prepared for surgery of pelvic masses. -She denies any family history of thyroid dysfunction. She denies any personal history of goiter nor family history of thyroid cancer. She denies heat or cold intolerance, palpitations, tremors.  Review of Systems  Constitutional: + weight gain,  + fatigue, no subjective hyperthermia/hypothermia Eyes: no blurry vision, no xerophthalmia ENT: no sore throat, no nodules palpated in throat, no dysphagia/odynophagia, no hoarseness Cardiovascular: no CP/SOB/palpitations/leg swelling Respiratory: no cough/SOB Gastrointestinal: no N/V/D/C Musculoskeletal: no muscle/joint aches Skin: no rashes Neurological: no tremors/numbness/tingling/dizziness Psychiatric: no depression/anxiety  Objective:    BP 117/82   Pulse 93   Ht '5\' 7"'  (1.702 m)   Wt 227 lb (103 kg)   BMI 35.55 kg/m   Wt Readings from Last 3 Encounters:  04/11/16 227 lb (103 kg)  04/06/16 225 lb (102.1 kg)  03/31/16 224 lb 6.4 oz (101.8 kg)    Physical Exam  Constitutional: overweight, in NAD Eyes: PERRLA, EOMI, no exophthalmos ENT: moist mucous membranes, no thyromegaly, no cervical lymphadenopathy Cardiovascular: RRR, No MRG Respiratory: CTA B Gastrointestinal: abdomen soft, NT, ND, BS+ Musculoskeletal: no deformities, strength intact in all 4 Skin: moist, warm, no rashes Neurological: no tremor with outstretched hands, DTR normal in all 4   Complete Blood Count (Most recent): Lab Results  Component Value Date   WBC 8.2 03/21/2016   HGB 14.6 03/21/2016   HCT 43.2 03/21/2016   MCV 92.7 03/21/2016   PLT 212 03/21/2016   Chemistry (most recent): Lab Results  Component Value Date   NA 140 03/23/2016   K 4.1 03/23/2016   CL 106 03/23/2016   CO2 22 03/23/2016   BUN 15 03/23/2016    CREATININE 0.62 03/23/2016   Recent Results (from the past 2160 hour(s))  Comprehensive metabolic panel     Status: Abnormal   Collection Time: 02/23/16  6:35 AM  Result Value Ref Range   Sodium 135 135 - 145 mmol/L   Potassium 4.2 3.5 - 5.1 mmol/L   Chloride 104 101 - 111 mmol/L   CO2 22 22 - 32 mmol/L   Glucose, Bld 108 (H) 65 - 99 mg/dL   BUN 23 (H) 6 - 20 mg/dL   Creatinine, Ser 0.73 0.44 - 1.00 mg/dL   Calcium 9.2 8.9 - 10.3 mg/dL   Total Protein 7.5 6.5 - 8.1 g/dL   Albumin 4.1 3.5 - 5.0 g/dL   AST 20 15 - 41 U/L   ALT 20 14 - 54 U/L   Alkaline Phosphatase 57 38 - 126 U/L   Total Bilirubin 0.3 0.3 - 1.2 mg/dL   GFR calc non Af Amer >60 >60 mL/min   GFR calc Af Amer >60 >60 mL/min    Comment: (NOTE) The  eGFR has been calculated using the CKD EPI equation. This calculation has not been validated in all clinical situations. eGFR's persistently <60 mL/min signify possible Chronic Kidney Disease.    Anion gap 9 5 - 15  CBC with Differential     Status: Abnormal   Collection Time: 02/23/16  6:35 AM  Result Value Ref Range   WBC 14.8 (H) 4.0 - 10.5 K/uL   RBC 4.66 3.87 - 5.11 MIL/uL   Hemoglobin 14.9 12.0 - 15.0 g/dL   HCT 43.8 36.0 - 46.0 %   MCV 94.0 78.0 - 100.0 fL   MCH 32.0 26.0 - 34.0 pg   MCHC 34.0 30.0 - 36.0 g/dL   RDW 13.6 11.5 - 15.5 %   Platelets 237 150 - 400 K/uL   Neutrophils Relative % 62 %   Neutro Abs 9.2 (H) 1.7 - 7.7 K/uL   Lymphocytes Relative 28 %   Lymphs Abs 4.1 (H) 0.7 - 4.0 K/uL   Monocytes Relative 8 %   Monocytes Absolute 1.2 (H) 0.1 - 1.0 K/uL   Eosinophils Relative 2 %   Eosinophils Absolute 0.3 0.0 - 0.7 K/uL   Basophils Relative 0 %   Basophils Absolute 0.0 0.0 - 0.1 K/uL  D-dimer, quantitative     Status: None   Collection Time: 02/23/16  6:35 AM  Result Value Ref Range   D-Dimer, Quant 0.41 0.00 - 0.50 ug/mL-FEU    Comment: (NOTE) At the manufacturer cut-off of 0.50 ug/mL FEU, this assay has been documented to exclude PE with a  sensitivity and negative predictive value of 97 to 99%.  At this time, this assay has not been approved by the FDA to exclude DVT/VTE. Results should be correlated with clinical presentation.   Lipase, blood     Status: None   Collection Time: 02/23/16  6:35 AM  Result Value Ref Range   Lipase 32 11 - 51 U/L  Troponin I     Status: None   Collection Time: 02/23/16  6:35 AM  Result Value Ref Range   Troponin I <0.03 <0.03 ng/mL  Urinalysis, Routine w reflex microscopic     Status: Abnormal   Collection Time: 03/21/16 12:21 AM  Result Value Ref Range   Color, Urine YELLOW YELLOW   APPearance CLEAR CLEAR   Specific Gravity, Urine 1.026 1.005 - 1.030   pH 7.0 5.0 - 8.0   Glucose, UA NEGATIVE NEGATIVE mg/dL   Hgb urine dipstick SMALL (A) NEGATIVE   Bilirubin Urine NEGATIVE NEGATIVE   Ketones, ur 20 (A) NEGATIVE mg/dL   Protein, ur 30 (A) NEGATIVE mg/dL   Nitrite NEGATIVE NEGATIVE   Leukocytes, UA NEGATIVE NEGATIVE   RBC / HPF TOO NUMEROUS TO COUNT 0 - 5 RBC/hpf   WBC, UA 0-5 0 - 5 WBC/hpf   Bacteria, UA NONE SEEN NONE SEEN   Mucous PRESENT   Comprehensive metabolic panel     Status: Abnormal   Collection Time: 03/21/16 12:21 AM  Result Value Ref Range   Sodium 136 135 - 145 mmol/L   Potassium 3.6 3.5 - 5.1 mmol/L   Chloride 103 101 - 111 mmol/L   CO2 24 22 - 32 mmol/L   Glucose, Bld 121 (H) 65 - 99 mg/dL   BUN 15 6 - 20 mg/dL   Creatinine, Ser 0.76 0.44 - 1.00 mg/dL   Calcium 9.1 8.9 - 10.3 mg/dL   Total Protein 7.1 6.5 - 8.1 g/dL   Albumin 4.0 3.5 - 5.0 g/dL  AST 19 15 - 41 U/L   ALT 17 14 - 54 U/L   Alkaline Phosphatase 49 38 - 126 U/L   Total Bilirubin 0.5 0.3 - 1.2 mg/dL   GFR calc non Af Amer >60 >60 mL/min   GFR calc Af Amer >60 >60 mL/min    Comment: (NOTE) The eGFR has been calculated using the CKD EPI equation. This calculation has not been validated in all clinical situations. eGFR's persistently <60 mL/min signify possible Chronic Kidney Disease.     Anion gap 9 5 - 15  Lipase, blood     Status: None   Collection Time: 03/21/16 12:21 AM  Result Value Ref Range   Lipase 26 11 - 51 U/L  CBC with Differential     Status: None   Collection Time: 03/21/16 12:21 AM  Result Value Ref Range   WBC 8.2 4.0 - 10.5 K/uL   RBC 4.66 3.87 - 5.11 MIL/uL   Hemoglobin 14.6 12.0 - 15.0 g/dL   HCT 43.2 36.0 - 46.0 %   MCV 92.7 78.0 - 100.0 fL   MCH 31.3 26.0 - 34.0 pg   MCHC 33.8 30.0 - 36.0 g/dL   RDW 13.0 11.5 - 15.5 %   Platelets 212 150 - 400 K/uL   Neutrophils Relative % 86 %   Neutro Abs 7.0 1.7 - 7.7 K/uL   Lymphocytes Relative 10 %   Lymphs Abs 0.9 0.7 - 4.0 K/uL   Monocytes Relative 4 %   Monocytes Absolute 0.3 0.1 - 1.0 K/uL   Eosinophils Relative 0 %   Eosinophils Absolute 0.0 0.0 - 0.7 K/uL   Basophils Relative 0 %   Basophils Absolute 0.0 0.0 - 0.1 K/uL  Urine culture     Status: Abnormal   Collection Time: 03/21/16 12:21 AM  Result Value Ref Range   Specimen Description URINE, CLEAN CATCH    Special Requests Normal    Culture MULTIPLE SPECIES PRESENT, SUGGEST RECOLLECTION (A)    Report Status 03/23/2016 FINAL   POC Urine Pregnancy, ED (do NOT order at [facility])     Status: None   Collection Time: 03/21/16 12:32 AM  Result Value Ref Range   Preg Test, Ur NEGATIVE NEGATIVE    Comment:        THE SENSITIVITY OF THIS METHODOLOGY IS >24 mIU/mL   TSH     Status: Abnormal   Collection Time: 03/23/16 10:47 AM  Result Value Ref Range   TSH 0.06 (L) mIU/L    Comment:   Reference Range   > or = 20 Years  0.40-4.50   Pregnancy Range First trimester  0.26-2.66 Second trimester 0.55-2.73 Third trimester  0.43-2.91     T4, Free     Status: None   Collection Time: 03/23/16 10:47 AM  Result Value Ref Range   Free T4 1.7 0.8 - 1.8 ng/dL  Comprehensive metabolic panel     Status: None   Collection Time: 03/23/16 12:13 PM  Result Value Ref Range   Sodium 140 135 - 146 mmol/L   Potassium 4.1 3.5 - 5.3 mmol/L   Chloride 106 98 -  110 mmol/L   CO2 22 20 - 31 mmol/L   Glucose, Bld 95 65 - 99 mg/dL   BUN 15 7 - 25 mg/dL   Creat 0.62 0.50 - 1.10 mg/dL   Total Bilirubin 0.2 0.2 - 1.2 mg/dL   Alkaline Phosphatase 44 33 - 115 U/L   AST 15 10 - 30 U/L  ALT 12 6 - 29 U/L   Total Protein 6.2 6.1 - 8.1 g/dL   Albumin 3.8 3.6 - 5.1 g/dL   Calcium 8.9 8.6 - 10.2 mg/dL  GC/Chlamydia probe amp ([place])not at [facility]     Status: None   Collection Time: 04/06/16 12:00 AM  Result Value Ref Range   Chlamydia Negative     Comment: Normal Reference Range - Negative   Neisseria gonorrhea Negative     Comment: Normal Reference Range - Negative  Wet prep, genital     Status: None   Collection Time: 04/06/16 10:52 AM  Result Value Ref Range   Yeast Wet Prep HPF POC NONE SEEN NONE SEEN   Trich, Wet Prep NONE SEEN NONE SEEN   Clue Cells Wet Prep HPF POC NONE SEEN NONE SEEN   WBC, Wet Prep HPF POC NONE SEEN NONE SEEN   Sperm NONE SEEN   Urinalysis, Routine w reflex microscopic     Status: Abnormal   Collection Time: 04/06/16 10:52 AM  Result Value Ref Range   Color, Urine RED (A) YELLOW    Comment: BIOCHEMICALS MAY BE AFFECTED BY COLOR   APPearance CLOUDY (A) CLEAR   Specific Gravity, Urine 1.020 1.005 - 1.030   pH 8.5 (H) 5.0 - 8.0   Glucose, UA NEGATIVE NEGATIVE mg/dL   Hgb urine dipstick LARGE (A) NEGATIVE   Bilirubin Urine NEGATIVE NEGATIVE   Ketones, ur NEGATIVE NEGATIVE mg/dL   Protein, ur 30 (A) NEGATIVE mg/dL   Nitrite NEGATIVE NEGATIVE   Leukocytes, UA NEGATIVE NEGATIVE  Urinalysis, Microscopic (reflex)     Status: Abnormal   Collection Time: 04/06/16 10:52 AM  Result Value Ref Range   RBC / HPF TOO NUMEROUS TO COUNT 0 - 5 RBC/hpf   WBC, UA NONE SEEN 0 - 5 WBC/hpf   Bacteria, UA FEW (A) NONE SEEN   Squamous Epithelial / LPF NONE SEEN NONE SEEN    Assessment & Plan:   1. 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 I will Lower her levothyroxine to  150 g by mouth every morning.   - We discussed about correct intake of levothyroxine, at fasting, with water, separated by at least 30 minutes from breakfast, and separated by more than 4 hours from calcium, iron, multivitamins, acid reflux medications (PPIs). -Patient is made aware of the fact that thyroid hormone replacement is needed for life, dose to be adjusted by periodic monitoring of thyroid function tests. -For weight gain, I have discussed a diet program higher in protein and lower in carbohydrates coupled with regular exercise combining aerobics, strength, and a stretch components.  2.hypertension: controlled I advised her to continue  Lisinopril 10 mg by mouth daily.  - I advised patient to maintain close follow up with Mickie Hillier, MD for primary care needs. Follow up plan: Return in about 6 months (around 10/09/2016) for follow up with pre-visit labs.  Glade Lloyd, MD Phone: [phone]  Fax: [phone]   04/11/2016, 10:10 AM

## 2016-04-12 ENCOUNTER — Telehealth: Payer: Self-pay | Admitting: Family Medicine

## 2016-04-12 NOTE — Telephone Encounter (Signed)
Message for Teresa JonesCarolyn - patient needing letter for surgical clearance stating she ok since her stroke in 2009 to have surgery for hysterectomy on 2/7 needing it by 1/30.

## 2016-04-14 ENCOUNTER — Ambulatory Visit: Payer: Medicaid Other | Admitting: Adult Health

## 2016-04-17 ENCOUNTER — Encounter: Payer: Self-pay | Admitting: Nurse Practitioner

## 2016-04-17 NOTE — Telephone Encounter (Signed)
Letter was done earlier today and given to front desk.

## 2016-04-17 NOTE — Telephone Encounter (Signed)
Pt called Back this morning. States she needs this letter today. She has an appointment for pre- op tomorrow.

## 2016-05-04 ENCOUNTER — Emergency Department (HOSPITAL_COMMUNITY)
Admission: EM | Admit: 2016-05-04 | Discharge: 2016-05-04 | Disposition: A | Discharge status: Home / Self Care | Payer: Medicaid Other | Attending: Emergency Medicine | Admitting: Emergency Medicine

## 2016-05-04 ENCOUNTER — Other Ambulatory Visit: Payer: Self-pay | Admitting: Nurse Practitioner

## 2016-05-04 ENCOUNTER — Telehealth: Payer: Self-pay | Admitting: Family Medicine

## 2016-05-04 ENCOUNTER — Encounter (HOSPITAL_COMMUNITY): Payer: Self-pay

## 2016-05-04 DIAGNOSIS — Z79899 Other long term (current) drug therapy: Secondary | ICD-10-CM | POA: Insufficient documentation

## 2016-05-04 DIAGNOSIS — Z4801 Encounter for change or removal of surgical wound dressing: Secondary | ICD-10-CM | POA: Insufficient documentation

## 2016-05-04 DIAGNOSIS — E039 Hypothyroidism, unspecified: Secondary | ICD-10-CM | POA: Insufficient documentation

## 2016-05-04 DIAGNOSIS — F17210 Nicotine dependence, cigarettes, uncomplicated: Secondary | ICD-10-CM | POA: Diagnosis not present

## 2016-05-04 DIAGNOSIS — Z5189 Encounter for other specified aftercare: Secondary | ICD-10-CM

## 2016-05-04 MED ORDER — LISINOPRIL 10 MG PO TABS: 10.0000 mg | ORAL_TABLET | Freq: Every day | ORAL | 1 refills | Status: DC

## 2016-05-04 NOTE — Discharge Instructions (Signed)
Continue routine wound care. Follow up with your surgeon as scheduled.

## 2016-05-04 NOTE — Telephone Encounter (Signed)
Patient notified

## 2016-05-04 NOTE — ED Triage Notes (Signed)
[facility] has dermabond in place on inciision that she wants checked.  Pt states the glue is coming off.

## 2016-05-04 NOTE — Telephone Encounter (Signed)
Dr. Fransico HimNida has been prescribing lately. Last time I prescribed was 2016. Refilled med. Follow up with Dr. Fransico HimNida as planned.

## 2016-05-04 NOTE — ED Provider Notes (Signed)
[facility]-EMERGENCY DEPT Provider Note   CSN: [id number] Arrival date & time: 05/04/16  0017     History   Chief Complaint Chief Complaint  Patient presents with  . Wound Check    Incision    HPI Teresa Vincent is a 36 y.o. female.  She had a hysterectomy and removal of a lipoma done one week ago. Since then, she has noted some intermittent swelling in the left labial area around the incision for removal of lipoma. She has noted some increased pain in that area and noted that the glue that had been applied was coming off. It appeared that one part of the incision was separating and she noticed some clear drainage. She denies fever chills. She denies nausea or vomiting. She does not have a postop appointment for another 2 weeks. She did call the on-call nurse who recommended that she come here for consideration of applying another layer of closure.      Past Medical History:  Diagnosis Date  . Abnormal Pap smear of cervix   . ANXIETY 08/30/2007  . GRAVES' DISEASE 12/07/2008  . 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 INSOMNIA 08/30/2007  . MIGRAINE HEADACHE 08/30/2007  . Neck pain   . Renal disorder    kidney stone  . Stroke [facility])     Patient Active Problem List   Diagnosis Date Noted  . 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/10/2015  . Morbid obesity due to excess calories (HCC) 06/10/2015  . Left-sided low back pain with left-sided sciatica 05/03/2015  . Essential hypertension, benign 12/30/2012  . Esophageal reflux 12/30/2012  . 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 ANXIETY 08/30/2007  . MIGRAINE HEADACHE 08/30/2007  . Insomnia 08/30/2007    Past Surgical History:  Procedure Laterality Date  . CARPAL TUNNEL RELEASE    . LUMBAR EPIDURAL INJECTION    . TUBAL LIGATION  2003    OB History    Gravida Para Term Preterm AB Living   4 1 1   3 1    SAB TAB Ectopic Multiple Live Births                   Home Medications    Prior to  Admission medications   Medication Sig Start Date End Date Taking? Authorizing Provider  albuterol (PROVENTIL HFA;VENTOLIN HFA) 108 (90 Base) MCG/ACT inhaler Inhale 1-2 puffs into the lungs every 4 (four) hours as needed for wheezing or shortness of breath. 01/06/16   Merlyn AlbertWilliam S Luking, MD  ALPRAZolam Prudy Feeler(XANAX) 0.5 MG tablet TAKE ONE TABLET BY MOUTH AT BEDTIME AS NEEDED FOR SLEEP 09/06/15   Campbell Richesarolyn C Hoskins, NP  amoxicillin (AMOXIL) 500 MG tablet Take 500 mg by mouth 3 (three) times daily.    Historical Provider, MD  cyclobenzaprine (FLEXERIL) 10 MG tablet Take 1 tablet (10 mg total) by mouth 3 (three) times daily as needed for muscle spasms. 02/23/16   Devoria AlbeIva Knapp, MD  diclofenac (VOLTAREN) 75 MG EC tablet Take 1 tablet (75 mg total) by mouth 2 (two) times daily. 03/31/16   Campbell Richesarolyn C Hoskins, NP  HYDROcodone-acetaminophen (NORCO) 7.5-325 MG tablet Take 1 tablet by mouth every 4 (four) hours as needed. Patient taking differently: Take 1 tablet by mouth every 4 (four) hours as needed for moderate pain.  03/31/16   Campbell Richesarolyn C Hoskins, NP  levothyroxine (SYNTHROID, LEVOTHROID) 150 MCG tablet TAKE ONE TABLET BY MOUTH ONCE DAILY BEFORE BREAKFAST 04/11/16   Roma KayserGebreselassie W Nida, MD  lisinopril (PRINIVIL,ZESTRIL) 10 MG tablet  TAKE ONE TABLET BY MOUTH ONCE DAILY 12/09/15   Roma Kayser, MD  oxyCODONE-acetaminophen (PERCOCET) 5-325 MG tablet Take 1-2 tablets by mouth every 4 (four) hours as needed. Patient taking differently: Take 1-2 tablets by mouth every 4 (four) hours as needed for moderate pain.  02/06/16   Donnetta Hutching, MD  promethazine (PHENERGAN) 25 MG tablet Take 1 tablet (25 mg total) by mouth every 6 (six) hours as needed for nausea or vomiting. 03/21/16   Ivery Quale, PA-C    Family History Family History  Problem Relation Age of Onset  . Hypertension Mother   . Thyroid disease Neg Hx     Social History Social History  Substance Use Topics  . Smoking status: Current Every Day Smoker     Packs/day: 1.00    Years: 26.00    Types: Cigarettes    Last attempt to quit: 06/29/2012  . Smokeless tobacco: Never Used  . Alcohol use 0.0 oz/week     Comment: occasional     Allergies   Adhesive [tape] and Celexa [citalopram hydrobromide]   Review of Systems Review of Systems  All other systems reviewed and are negative.    Physical Exam Updated Vital Signs BP 140/90 (BP Location: Left Arm)   Pulse 80   Temp 98 F (36.7 C) (Oral)   Resp 16   Ht 5\' 7"  (1.702 m)   Wt 223 lb (101.2 kg)   SpO2 99%   BMI 34.93 kg/m   Physical Exam  Nursing note and vitals reviewed.  36 year old female, resting comfortably and in no acute distress. Vital signs are Significant for borderline hypertension. Oxygen saturation is 99%, which is normal. Head is normocephalic and atraumatic. PERRLA, EOMI. Oropharynx is clear. Neck is nontender and supple without adenopathy or JVD. Back is nontender and there is no CVA tenderness. Lungs are clear without rales, wheezes, or rhonchi. Chest is nontender. Heart has regular rate and rhythm without murmur. Abdomen is soft, flat, nontender without masses or hepatosplenomegaly and peristalsis is normoactive. Genitalia: Incision noted on the left side lateral to the labium majora with minimal dehiscence of the inferior aspect of the incision. No erythema and no drainage. There is mild induration and moderate tenderness. Extremities have no cyanosis or edema, full range of motion is present. Skin is warm and dry without rash. Neurologic: Mental status is normal, cranial nerves are intact, there are no motor or sensory deficits.  ED Treatments / Results   Procedures Procedures (including critical care time)  Medications Ordered in ED Medications - No data to display   Initial Impression / Assessment and Plan / ED Course  I have reviewed the triage vital signs and the nursing notes.  Postoperative pain at site of excision of lipoma in the left  inguinal area. This appears to be healing appropriately with exception of very small area of dehiscence but no sign of infection. I have explained to the patient that application of Dermabond is unlikely to change the course warmly or the other. If this is destined to have a full dehiscence, that will happen whether Dermabond is applied again or not. She is requesting that it be done anyway. Dermabond is applied to the incision area and she is discharged with instructions to resume her routine wound care. All the records reviewed confirming hyste[facility] and [facility] one [facility] ago.  Final Clinical Impressions(s) / ED Diagnoses   Final diagnoses:  Visit for wound  check    New Prescriptions New Prescriptions   No medications on file     Dione Booze, MD 05/04/16 [phone]

## 2016-05-04 NOTE — Telephone Encounter (Signed)
Out of Lisinopril 10 mg. Will schedule if she needs an appt but needs refill before then. Uses [facility]   Pt number [phone].

## 2016-05-05 ENCOUNTER — Encounter: Payer: Self-pay | Admitting: Family Medicine

## 2016-05-05 ENCOUNTER — Ambulatory Visit (INDEPENDENT_AMBULATORY_CARE_PROVIDER_SITE_OTHER): Payer: Medicaid Other | Admitting: Family Medicine

## 2016-05-05 VITALS — BP 110/72 | Ht 67.0 in | Wt 225.0 lb

## 2016-05-05 DIAGNOSIS — I10 Essential (primary) hypertension: Secondary | ICD-10-CM | POA: Diagnosis not present

## 2016-05-05 DIAGNOSIS — F5101 Primary insomnia: Secondary | ICD-10-CM | POA: Diagnosis not present

## 2016-05-05 DIAGNOSIS — R21 Rash and other nonspecific skin eruption: Secondary | ICD-10-CM

## 2016-05-05 MED ORDER — ALPRAZOLAM 0.5 MG PO TABS: 0.5000 mg | ORAL_TABLET | Freq: Every evening | ORAL | 5 refills | Status: DC | PRN

## 2016-05-05 MED ORDER — LISINOPRIL 10 MG PO TABS: 10.0000 mg | ORAL_TABLET | Freq: Every day | ORAL | 1 refills | Status: DC

## 2016-05-05 NOTE — Progress Notes (Signed)
   Subjective:    Patient ID: Teresa Vincent, female    DOB: 04/19/1980, 36 y.o.   MRN: [id number]  Hypertension  This is a chronic problem. Compliance problems include diet.    Blood pressure medicine and blood pressure levels reviewed today with patient. Compliant with blood pressure medicine. States does not miss a dose. No obvious side effects. Blood pressure generally good when checked elsewhere. Watching salt intake.  Patient compliant with insomnia medication. Generally takes most nights. No obvious morning drowsiness. Definitely helps patient sleep. Without it patient states would not get a good nights rest.  Strong fam hx of high blood pressure  Patient discuss recent lipoma repeat removal and complication as far as pain in challenges since then. ER visits reviewed. Ongoing pain from in her thigh down right leg. Using occasional pain medicine.     Needs refills on xanax and lisinopril.    Review of Systems No headache, no major weight loss or weight gain, no chest pain no back pain abdominal pain no change in bowel habits complete ROS otherwise negative     Objective:   Physical Exam  Alert vitals stable, NAD. Blood pressure good on repeat. HEENT normal. Lungs clear. Heart regular rate and rhythm. Right leg negative straight leg raise groin dressing intact ankles trace edema      Assessment & Plan:  Impression 1 hypertension good control discussed maintain same meds #2 insomnia good control discussed maintain same compliance discussed #3 leg pain secondary from groin discussed using when necessary medicine slowly improving reassured this should get better and better WSL

## 2016-05-12 ENCOUNTER — Telehealth: Payer: Self-pay | Admitting: Family Medicine

## 2016-05-12 NOTE — Telephone Encounter (Signed)
Patient is taking levothyroxine and she wants to know if she can take folic acid with this to help with hair growth.

## 2016-05-14 NOTE — Telephone Encounter (Signed)
She can take it without affecting thyroid, may or may not help hair growth

## 2016-05-15 NOTE — Telephone Encounter (Signed)
Left message on voicemail to return call.

## 2016-05-15 NOTE — Telephone Encounter (Signed)
Patient notified she can take it without affecting thyroid, may or may not help hair growth. Patient verbalized understanding.

## 2016-05-23 ENCOUNTER — Telehealth: Payer: Self-pay | Admitting: Nurse Practitioner

## 2016-05-23 NOTE — Telephone Encounter (Signed)
Appointment scheduled.

## 2016-05-23 NOTE — Telephone Encounter (Signed)
Ov with [facility] first likely will need new referral out

## 2016-05-23 NOTE — Telephone Encounter (Signed)
Had hysterectomy and lipoma in private area removed on 04-26-16. Said she thinks she is getting an infection down there and wants to see Eber JonesCarolyn. Said she has been released by the surgeon and she called them and they said they couldn't help her anymore.  She doesn't want to go back to them, [place] [place] [place] [facility], because she doesn't trust them and wants to see Eber JonesCarolyn.  Said the area where the lipoma was removed is red and oozing and painful.  I wasn't sure if I should schedule her here without someone talking to her first.  She is adamant about not going back to Dr. Magnus IvanLouie.

## 2016-05-24 ENCOUNTER — Encounter: Payer: Self-pay | Admitting: Nurse Practitioner

## 2016-05-24 ENCOUNTER — Ambulatory Visit (INDEPENDENT_AMBULATORY_CARE_PROVIDER_SITE_OTHER): Payer: Medicaid Other | Admitting: Nurse Practitioner

## 2016-05-24 VITALS — BP 132/90 | Temp 98.6°F | Ht 67.0 in | Wt 229.0 lb

## 2016-05-24 DIAGNOSIS — N9489 Other specified conditions associated with female genital organs and menstrual cycle: Secondary | ICD-10-CM | POA: Diagnosis not present

## 2016-05-24 MED ORDER — CEPHALEXIN 500 MG PO CAPS: 500.0000 mg | ORAL_CAPSULE | Freq: Three times a day (TID) | ORAL | 0 refills | Status: DC

## 2016-05-24 NOTE — Progress Notes (Signed)
Subjective:  Presents to discuss left labial area pain after having a large lipoma removed during her recent hysterectomy. This area actually bothers her more than the hysterectomy. Was slightly draining at one point but this has stopped. Has noticed a slight dark pink color change over the past few days. No fever. No further drainage. Area is extremely sensitive even to light touch with some discomfort with pressure even with sitting. Currently on gabapentin for her back. Has discussed this with her surgeon but has not received any help regarding the pain.  Objective:   BP 132/90   Temp 98.6 F (37 C) (Oral)   Ht 5\' 7"  (1.702 m)   Wt 229 lb (103.9 kg)   LMP 02/25/2016 Comment: irregular periods  BMI 35.87 kg/m  NAD. Alert, oriented. Linear approximately 2-3 centimeter surgical incision noted in the left labial area. Wound is closed and healing well. Minimal pink color. Flattening underneath this consistent with developing scar tissue. No mass. No drainage. Entire area is tender to palpation. Patient has a copy of the pathology report on her phone which describes the lesion as a proximally 6 x 4 cm.  Assessment:  Labial pain    Plan:   Meds ordered this encounter  Medications  . cephALEXin (KEFLEX) 500 MG capsule    Sig: Take 1 capsule (500 mg total) by mouth 3 (three) times daily.    Dispense:  21 capsule    Refill:  0    Order Specific Question:   Supervising Provider    Answer:   Merlyn AlbertLUKING, WILLIAM S [[id number]]   Due to recent color change will cover with Keflex for possible infection. Also explained that this is a very large deep surgical wound due to the size of her lipoma and will take time to improve. Will check into a topical anesthetic to see if this will help. Do not recommend opiates at this point due to addiction potential and probable persistent long-term healing of this area. Call back if no improvement. Continue gabapentin as directed.

## 2016-05-29 ENCOUNTER — Telehealth: Payer: Self-pay | Admitting: Family Medicine

## 2016-05-29 NOTE — Telephone Encounter (Signed)
(  Message for Teresa JonesCarolyn) patient was seen 3/7 and was asking about numbing cream you are suppose to be looking up for her.

## 2016-05-31 NOTE — Telephone Encounter (Signed)
Patient calling to check on this message.

## 2016-06-01 ENCOUNTER — Other Ambulatory Visit: Payer: Self-pay | Admitting: Nurse Practitioner

## 2016-06-01 MED ORDER — LIDOCAINE HCL 2 % EX GEL: 1.0000 "application " | CUTANEOUS | 0 refills | Status: DC | PRN

## 2016-06-01 NOTE — Telephone Encounter (Signed)
Voice mail box not set up.

## 2016-06-01 NOTE — Telephone Encounter (Signed)
Patient notified

## 2016-06-01 NOTE — Telephone Encounter (Signed)
Sent in topical xylocaine jelly. Let us know if this does not help.

## 2016-06-14 ENCOUNTER — Telehealth: Payer: Self-pay | Admitting: Family Medicine

## 2016-06-14 NOTE — Telephone Encounter (Signed)
(   Message For Teresa Vincent)Patient requesting new prescription for gabapentin 300 mg with refills she states takes 4 pills a day [facility]

## 2016-06-15 ENCOUNTER — Other Ambulatory Visit: Payer: Self-pay | Admitting: Nurse Practitioner

## 2016-06-15 MED ORDER — GABAPENTIN 300 MG PO CAPS: ORAL_CAPSULE | ORAL | 5 refills | Status: DC

## 2016-06-15 NOTE — Telephone Encounter (Signed)
done

## 2016-07-17 ENCOUNTER — Telehealth: Payer: Self-pay | Admitting: Nurse Practitioner

## 2016-07-17 NOTE — Telephone Encounter (Signed)
Pt still on pain med so she wants one of the other two

## 2016-07-17 NOTE — Telephone Encounter (Signed)
Belviq or Qsymia?

## 2016-07-17 NOTE — Telephone Encounter (Signed)
If she is off pain meds, she could also try Contrave

## 2016-07-17 NOTE — Telephone Encounter (Signed)
Pt called stating that Vyctoria told her if she wanted to try a different weight loss medication to call her and let her know. Pt was prescribed phentermine in the past and is wanting to try one of the other two that Alaynna suggested that was effective but more costly. Please advise.

## 2016-07-24 NOTE — Telephone Encounter (Signed)
Pt is wanting to try which ever is the best between belviq and qsymia. Pt wants Eber JonesCarolyn to choose.

## 2016-08-01 NOTE — Telephone Encounter (Signed)
Patient called to check on this message.

## 2016-08-03 ENCOUNTER — Other Ambulatory Visit: Payer: Self-pay | Admitting: Nurse Practitioner

## 2016-08-03 MED ORDER — PHENTERMINE HCL 37.5 MG PO TABS: 37.5000 mg | ORAL_TABLET | Freq: Every day | ORAL | 2 refills | Status: DC

## 2016-08-03 NOTE — Telephone Encounter (Signed)
Pt asks if you can refill the phentermine for 3 months and she will be contacting the pharmacy for pricing with Medicaid. Pt also asking for a refill on Percocet 5 - 325.

## 2016-08-03 NOTE — Telephone Encounter (Signed)
Cannot give Percocet with Xanax and Phentermine. Will send in Phentermine as requested; cost is usually less than $30

## 2016-08-03 NOTE — Telephone Encounter (Signed)
Pt called back and said both Belviq and Qsymia are too expensive and would like to just have phentermine RX.

## 2016-08-03 NOTE — Telephone Encounter (Signed)
Patient is under Medicaid which will not cover any weight loss meds. The problem with these meds are the costs even with discount. Does she still want them?

## 2016-08-04 ENCOUNTER — Other Ambulatory Visit: Payer: Self-pay | Admitting: Nurse Practitioner

## 2016-08-04 MED ORDER — OXYCODONE-ACETAMINOPHEN 5-325 MG PO TABS: 1.0000 | ORAL_TABLET | ORAL | 0 refills | Status: DC | PRN

## 2016-08-04 NOTE — Telephone Encounter (Signed)
Patient states she wants to cancel the Adipex so she can get the prescription for percocet and would like to pick up the prescription this pm (adipex prescription cancelled)

## 2016-08-22 ENCOUNTER — Encounter: Payer: Self-pay | Admitting: Family Medicine

## 2016-08-22 ENCOUNTER — Other Ambulatory Visit (HOSPITAL_COMMUNITY)
Admission: RE | Admit: 2016-08-22 | Discharge: 2016-08-22 | Disposition: A | Discharge status: Home / Self Care | Payer: Medicaid Other | Source: Ambulatory Visit | Attending: Family Medicine | Admitting: Family Medicine

## 2016-08-22 ENCOUNTER — Ambulatory Visit (HOSPITAL_COMMUNITY)
Admission: RE | Admit: 2016-08-22 | Discharge: 2016-08-22 | Disposition: A | Discharge status: Home / Self Care | Payer: Medicaid Other | Source: Ambulatory Visit | Attending: Family Medicine | Admitting: Family Medicine

## 2016-08-22 ENCOUNTER — Ambulatory Visit (INDEPENDENT_AMBULATORY_CARE_PROVIDER_SITE_OTHER): Payer: Medicaid Other | Admitting: Family Medicine

## 2016-08-22 VITALS — BP 130/80 | Temp 97.9°F | Ht 67.0 in | Wt 234.2 lb

## 2016-08-22 DIAGNOSIS — R109 Unspecified abdominal pain: Secondary | ICD-10-CM

## 2016-08-22 DIAGNOSIS — K5900 Constipation, unspecified: Secondary | ICD-10-CM | POA: Insufficient documentation

## 2016-08-22 DIAGNOSIS — N200 Calculus of kidney: Secondary | ICD-10-CM | POA: Diagnosis not present

## 2016-08-22 LAB — BASIC METABOLIC PANEL
ANION GAP: 7 (ref 5–15)
BUN: 17 mg/dL (ref 6–20)
CO2: 27 mmol/L (ref 22–32)
Calcium: 9.2 mg/dL (ref 8.9–10.3)
Chloride: 102 mmol/L (ref 101–111)
Creatinine, Ser: 0.83 mg/dL (ref 0.44–1.00)
Glucose, Bld: 101 mg/dL — ABNORMAL HIGH (ref 65–99)
POTASSIUM: 3.9 mmol/L (ref 3.5–5.1)
Sodium: 136 mmol/L (ref 135–145)

## 2016-08-22 LAB — CBC WITH DIFFERENTIAL/PLATELET
BASOS ABS: 0 10*3/uL (ref 0.0–0.1)
Basophils Relative: 0 %
Eosinophils Absolute: 0.1 10*3/uL (ref 0.0–0.7)
Eosinophils Relative: 1 %
HCT: 43.8 % (ref 36.0–46.0)
Hemoglobin: 14.9 g/dL (ref 12.0–15.0)
LYMPHS PCT: 28 %
Lymphs Abs: 2.4 10*3/uL (ref 0.7–4.0)
MCH: 30.8 pg (ref 26.0–34.0)
MCHC: 34 g/dL (ref 30.0–36.0)
MCV: 90.7 fL (ref 78.0–100.0)
Monocytes Absolute: 0.4 10*3/uL (ref 0.1–1.0)
Monocytes Relative: 5 %
NEUTROS ABS: 5.7 10*3/uL (ref 1.7–7.7)
Neutrophils Relative %: 66 %
PLATELETS: 282 10*3/uL (ref 150–400)
RBC: 4.83 MIL/uL (ref 3.87–5.11)
RDW: 13.6 % (ref 11.5–15.5)
WBC: 8.7 10*3/uL (ref 4.0–10.5)

## 2016-08-22 LAB — POCT URINALYSIS DIPSTICK
Blood, UA: NEGATIVE
LEUKOCYTES UA: NEGATIVE
Spec Grav, UA: 1.015 (ref 1.010–1.025)
Urobilinogen, UA: 1 E.U./dL
pH, UA: 7 (ref 5.0–8.0)

## 2016-08-22 LAB — HEPATIC FUNCTION PANEL
ALBUMIN: 4.1 g/dL (ref 3.5–5.0)
ALT: 17 U/L (ref 14–54)
AST: 16 U/L (ref 15–41)
Alkaline Phosphatase: 58 U/L (ref 38–126)
Bilirubin, Direct: <0.1 mg/dL — ABNORMAL LOW (ref 0.1–0.5)
TOTAL PROTEIN: 7.5 g/dL (ref 6.5–8.1)
Total Bilirubin: 0.5 mg/dL (ref 0.3–1.2)

## 2016-08-22 LAB — LIPASE, BLOOD: Lipase: 28 U/L (ref 11–51)

## 2016-08-22 LAB — AMYLASE: Amylase: 57 U/L (ref 28–100)

## 2016-08-22 MED ORDER — ONDANSETRON 4 MG PO TBDP: 4.0000 mg | ORAL_TABLET | Freq: Three times a day (TID) | ORAL | 0 refills | Status: DC | PRN

## 2016-08-22 NOTE — Progress Notes (Signed)
   Subjective:    Patient ID: Teresa Vincent, female    DOB: 07/23/1980, 36 y.o.   MRN: [id number]  Abdominal Pain  This is a recurrent problem. The current episode started more than 1 month ago. The pain is located in the suprapubic region, LLQ, RUQ and RLQ.    Patient states that she has had abdominal pain and vaginal pain since removal of lipoma labia , and hysterectomy.    Heavy menses and sever pain led to hysterectomy  Also hd labial surg with subsequent removal notes ongoing pain  Patient states she's had pain pretty much since before her hysterectomy and labial excision. Apparently the labial anomaly was a lipoma. Patient describes pain off-and-on since then. In recent days as had low abdominal pain. Fairly severe at times. Accompanied by slight nausea. No obvious fever or chills. No major change in bowel habits.  Patient does have history of chronic back pain which unfortunately she is had to take chronic occasional narcotics for  Review of Systems  Gastrointestinal: Positive for abdominal pain.       Objective:   Physical Exam  Alert and oriented, vitals reviewed and stable, NAD ENT-TM's and ext canals WNL bilat via otoscopic exam Soft palate, tonsils and post pharynx WNL via oropharyngeal exam Neck-symmetric, no masses; thyroid nonpalpable and nontender Pulmonary-no tachypnea or accessory muscle use; Clear without wheezes via auscultation Card--no abnrml murmurs, rhythm reg and rate WNL Carotid pulses symmetric, without bruits Abdomen diffuse low abdominal tenderness bowel sounds intact no rebound no guarding      Assessment & Plan:  Impression acute abdominal pain superimposed on chronic abdominal and pelvic pain plan stat blood work. Urinalysis x-ray. Warning signs discussed carefully. Addendum blood work all returned normal x-ray normal urinalysis normal. Patient due to follow-up with Eber Jonesarolyn for chronic GYN challenges.  Greater than 50% of this 25 minute  face to face visit was spent in counseling and discussion and coordination of care regarding the above diagnosis/diagnosies

## 2016-08-24 ENCOUNTER — Ambulatory Visit (INDEPENDENT_AMBULATORY_CARE_PROVIDER_SITE_OTHER): Payer: Medicaid Other | Admitting: Nurse Practitioner

## 2016-08-24 ENCOUNTER — Encounter: Payer: Self-pay | Admitting: Family Medicine

## 2016-08-24 ENCOUNTER — Encounter: Payer: Self-pay | Admitting: Nurse Practitioner

## 2016-08-24 VITALS — BP 114/84 | Ht 67.0 in | Wt 234.5 lb

## 2016-08-24 DIAGNOSIS — R350 Frequency of micturition: Secondary | ICD-10-CM | POA: Diagnosis not present

## 2016-08-24 DIAGNOSIS — N94819 Vulvodynia, unspecified: Secondary | ICD-10-CM

## 2016-08-24 LAB — POCT URINALYSIS DIPSTICK
Leukocytes, UA: NEGATIVE
PH UA: 5 (ref 5.0–8.0)
SPEC GRAV UA: 1.025 (ref 1.010–1.025)

## 2016-08-25 ENCOUNTER — Encounter: Payer: Self-pay | Admitting: Nurse Practitioner

## 2016-08-25 DIAGNOSIS — N94819 Vulvodynia, unspecified: Secondary | ICD-10-CM | POA: Insufficient documentation

## 2016-08-25 NOTE — Progress Notes (Signed)
Subjective:  Presents for complaints of chronic pain in the left labial area following the removal of a large lipoma. Has had consistent burning with occasional sharp pain and some numbness in this area. Worse with pressure such as sitting. 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 Currently on gabapentin for other nerve pain with minimal relief. Mild urinary frequency. No dysuria or fever.  Objective:   BP 114/84   Ht 5\' 7"  (1.702 m)   Wt 234 lb 8 oz (106.4 kg)   LMP 02/25/2016 Comment: irregular periods  BMI 36.73 kg/m  NAD. Alert, oriented. Lungs clear. Heart regular rate rhythm. External GU left side faint scarring from surgery but no excessive scar tissue noted. No erythema. Slight indentation in the labia compared to the right but no significant difference. Entire area is mildly tender to palpation. Results for orders placed or performed in visit on 08/24/16  POCT urinalysis dipstick  Result Value Ref Range   Color, UA Yellow    Clarity, UA Clear    Glucose, UA     Bilirubin, UA     Ketones, UA     Spec Grav, UA 1.025 1.010 - 1.025   Blood, UA     pH, UA 5.0 5.0 - 8.0   Protein, UA     Urobilinogen, UA  0.2 or 1.0 E.U./dL   Nitrite, UA     Leukocytes, UA Negative Negative   Urine micro-negative.  Assessment:   Problem List Items Addressed This Visit      Other   Vulvodynia - Primary    Other Visit Diagnoses    Urinary frequency       Relevant Orders   POCT urinalysis dipstick (Completed)       Plan:    Will do some research to see what else can be done for her vulvodynia other than current measures. Advised patient that opiates are not a good choice for long-term use due to addiction potential. Also recommend follow-up appointment.

## 2016-08-26 ENCOUNTER — Encounter: Payer: Self-pay | Admitting: Nurse Practitioner

## 2016-08-29 ENCOUNTER — Telehealth: Payer: Self-pay | Admitting: Nurse Practitioner

## 2016-08-29 NOTE — Telephone Encounter (Signed)
Patient seen Teresa Vincent on 08/24/16.  She said Teresa Vincent was going to research something for her and get back to her.  She is hoping she can hear something back soon.  She would like to be contacted through MyChart.

## 2016-09-01 NOTE — Telephone Encounter (Signed)
Patient called to check this.  She said she is in desperate need to be prescribed something for her issue.  She is hoping to have something done today.

## 2016-09-04 NOTE — Telephone Encounter (Signed)
Patient called to check on this again.  She wants to be called back when Eber JonesCarolyn comes back in the office on Wednesday.

## 2016-09-06 ENCOUNTER — Emergency Department (HOSPITAL_COMMUNITY)
Admission: EM | Admit: 2016-09-06 | Discharge: 2016-09-06 | Disposition: A | Discharge status: Home / Self Care | Payer: Medicaid Other | Attending: Emergency Medicine | Admitting: Emergency Medicine

## 2016-09-06 ENCOUNTER — Encounter (HOSPITAL_COMMUNITY): Payer: Self-pay | Admitting: Emergency Medicine

## 2016-09-06 DIAGNOSIS — Z79899 Other long term (current) drug therapy: Secondary | ICD-10-CM | POA: Insufficient documentation

## 2016-09-06 DIAGNOSIS — E039 Hypothyroidism, unspecified: Secondary | ICD-10-CM | POA: Insufficient documentation

## 2016-09-06 DIAGNOSIS — Y929 Unspecified place or not applicable: Secondary | ICD-10-CM | POA: Insufficient documentation

## 2016-09-06 DIAGNOSIS — Y999 Unspecified external cause status: Secondary | ICD-10-CM | POA: Insufficient documentation

## 2016-09-06 DIAGNOSIS — S00501A Unspecified superficial injury of lip, initial encounter: Secondary | ICD-10-CM | POA: Diagnosis present

## 2016-09-06 DIAGNOSIS — S00511A Abrasion of lip, initial encounter: Secondary | ICD-10-CM | POA: Diagnosis not present

## 2016-09-06 DIAGNOSIS — Y33XXXA Other specified events, undetermined intent, initial encounter: Secondary | ICD-10-CM | POA: Insufficient documentation

## 2016-09-06 DIAGNOSIS — F17210 Nicotine dependence, cigarettes, uncomplicated: Secondary | ICD-10-CM | POA: Diagnosis not present

## 2016-09-06 DIAGNOSIS — Y939 Activity, unspecified: Secondary | ICD-10-CM | POA: Diagnosis not present

## 2016-09-06 MED ORDER — SILVER NITRATE-POT NITRATE 75-25 % EX MISC
1.0000 | Freq: Once | CUTANEOUS | Status: AC
  Administered 2016-09-06: 1 via TOPICAL
  Filled 2016-09-06: qty 1

## 2016-09-06 NOTE — ED Provider Notes (Signed)
[facility]Y DEPT Provider Note   CSN: [id number] Arrival date & time: 09/06/16  1828     History   Chief Complaint Chief Complaint  Patient presents with  . Bleeding/Bruising    HPI Teresa Vincent is a 36 y.o. female.  HPI  Patient presents to ED for complaints of lower lip bleeding for the past 3 hours. She states that she bit her lip accidentally and there is a small sliver on her lip that keeps bleeding. She has tried ice, pressure, vinegar, salt to help control the bleeding and nothing has helped. She states that she also tried using a wet compress to wipe off the blood and this has not helped either. She states that she is continued to wipe the area as it continues to bleed. She denies any blood thinner use but states that she takes diclofenac every once in a while as needed. Otherwise denies any blood thinner use. She has no known history of bleeding disorders. She denies any previous history of similar symptoms of uncontrolled bleeding in the past. She reports some nausea which she states could be due to swallowing some of the blood. She denies vomiting, lightheadedness, syncope, abdominal pain.  Past Medical History:  Diagnosis Date  . Abnormal Pap smear of cervix   . ANXIETY 08/30/2007  . GRAVES' DISEASE 12/07/2008  . 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 INSOMNIA 08/30/2007  . MIGRAINE HEADACHE 08/30/2007  . Neck pain   . Renal disorder    kidney stone  . Stroke [facility])     Patient Active Problem List   Diagnosis Date Noted  . 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/10/2015  . Morbid obesity due to excess calories (HCC) 06/10/2015  . Left-sided low back pain with left-sided sciatica 05/03/2015  . Essential hypertension, benign 12/30/2012  . Esophageal reflux 12/30/2012  . 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 ANXIETY 08/30/2007  . MIGRAINE HEADACHE 08/30/2007  . Insomnia 08/30/2007    Past Surgical  History:  Procedure Laterality Date  . CARPAL TUNNEL RELEASE    . LUMBAR EPIDURAL INJECTION    . TOTAL ABDOMINAL HYSTERECTOMY W/ BILATERAL SALPINGOOPHORECTOMY N/A   . TUBAL LIGATION  2003    OB History    Gravida Para Term Preterm AB Living   4 1 1   3 1    SAB TAB Ectopic Multiple Live Births                   Home Medications    Prior to Admission medications   Medication Sig Start Date End Date Taking? Authorizing Provider  albuterol (PROVENTIL HFA;VENTOLIN HFA) 108 (90 Base) MCG/ACT inhaler Inhale 1-2 puffs into the lungs every 4 (four) hours as needed for wheezing or shortness of breath. 01/06/16   [name], MD  ALPRAZolam Prudy Feeler) 0.5 MG tablet Take 1 tablet (0.5 mg total) by mouth at bedtime as needed. for sleep 05/05/16   [name], MD  cyclobenzaprine (FLEXERIL) 10 MG tablet Take 1 tablet (10 mg total) by mouth 3 (three) times daily as needed for muscle spasms. 02/23/16   Devoria Albe, MD  diclofenac (VOLTAREN) 75 MG EC tablet Take 1 tablet (75 mg total) by mouth 2 (two) times daily. 03/31/16   Campbell Riches, NP  gabapentin (NEURONTIN) 300 MG capsule Take one po qam; one in the afternoon; 2 po at bedtime 06/15/16   Campbell Riches, NP  levothyroxine (SYNTHROID, LEVOTHROID) 150 MCG tablet TAKE ONE TABLET  BY MOUTH ONCE DAILY BEFORE BREAKFAST 04/11/16   Roma KayserNida, Gebreselassie W, MD  lidocaine (XYLOCAINE) 2 % jelly Apply 1 application topically as needed. 06/01/16   Campbell RichesHoskins, Ellyce C, NP  lisinopril (PRINIVIL,ZESTRIL) 10 MG tablet Take 1 tablet (10 mg total) by mouth daily. 05/05/16   Merlyn AlbertLuking, William S, MD  ondansetron (ZOFRAN ODT) 4 MG disintegrating tablet Take 1 tablet (4 mg total) by mouth every 8 (eight) hours as needed for nausea or vomiting. 08/22/16   Merlyn AlbertLuking, William S, MD  oxyCODONE-acetaminophen (PERCOCET) 5-325 MG tablet Take 1-2 tablets by mouth every 4 (four) hours as needed. 08/04/16   Campbell RichesHoskins, Jessicah C, NP    Family History Family History  Problem  Relation Age of Onset  . Hypertension Mother   . Thyroid disease Neg Hx     Social History Social History  Substance Use Topics  . Smoking status: Current Every Day Smoker    Packs/day: 1.00    Years: 26.00    Types: Cigarettes    Last attempt to quit: 06/29/2012  . Smokeless tobacco: Never Used  . Alcohol use 0.0 oz/week     Comment: occasional     Allergies   Adhesive [tape] and Celexa [citalopram hydrobromide]   Review of Systems Review of Systems  Constitutional: Negative for chills and fever.  HENT: Negative for dental problem, drooling, mouth sores, nosebleeds, rhinorrhea, sinus pain, sinus pressure and sore throat.   Gastrointestinal: Positive for nausea. Negative for abdominal pain and vomiting.  Skin: Positive for wound.  Neurological: Negative for syncope, light-headedness and headaches.     Physical Exam Updated Vital Signs BP (S) (!) 156/104 (BP Location: Right Arm)   Pulse 83   Temp 98.2 F (36.8 C) (Oral)   Resp 18   Ht 5\' 7"  (1.702 m)   Wt 106.1 kg (234 lb)   LMP 02/25/2016 Comment: irregular periods  SpO2 98%   BMI 36.65 kg/m   Physical Exam  Constitutional: She appears well-developed and well-nourished. No distress.  HENT:  Head: Normocephalic and atraumatic.  Small superficial puncture wound noted to the bottom lip on the right side. There is active bleeding noted. No visible bruising, laceration or color change noted.  Eyes: Conjunctivae and EOM are normal. No scleral icterus.  Neck: Normal range of motion.  Pulmonary/Chest: Effort normal. No respiratory distress.  Neurological: She is alert.  Skin: No rash noted. She is not diaphoretic.  Psychiatric: She has a normal mood and affect.  Nursing note and vitals reviewed.    ED Treatments / Results  Labs (all labs ordered are listed, but only abnormal results are displayed) Labs Reviewed - No data to display  EKG  EKG Interpretation None       Radiology No results  found.  Procedures Procedures (including critical care time)  Medications Ordered in ED Medications  silver nitrate applicators applicator 1 Stick (1 Stick Topical Given 09/06/16 1940)  silver nitrate applicators applicator 1 Stick (1 Stick Topical Given by Other 09/06/16 2012)     Initial Impression / Assessment and Plan / ED Course  I have reviewed the triage vital signs and the nursing notes.  Pertinent labs & imaging results that were available during my care of the patient were reviewed by me and considered in my medical decision making (see chart for details).     Patient presents to ED for complaints of bleeding from bottom lip through small puncture wound after biting her lip accidentally approximately 3 hours ago. She denies any  blood thinner use except for occasional diclofenac. She denies history of bleeding disorder or previous history of uncontrollable bleeding in the past. She denies lightheadedness, vomiting or syncope. On physical exam there is a small superficial puncture wound present on the bottom lip. Area was cauterized with silver nitrate stick. This stopped the bleeding here in the ED. Patient has no complaints at this time. She appears stable for discharge. Strict return precautions given.  Final Clinical Impressions(s) / ED Diagnoses   Final diagnoses:  Abrasion of lip, initial encounter    New Prescriptions New Prescriptions   No medications on file     Dietrich Pates, Cordelia Poche 09/06/16 2021    Loren Racer, MD 09/12/16 [phone]

## 2016-09-06 NOTE — Discharge Instructions (Signed)
Return to ED for increased bleeding, lightheadedness, loss of consciousness, vomiting, fever or no improvement in symptoms.

## 2016-09-06 NOTE — Telephone Encounter (Signed)
Pt ok with increasing gabapentin. She is currently taking 300mg  one qid. ( directions are different on med list)

## 2016-09-06 NOTE — Telephone Encounter (Signed)
Pt calling back to check on this message. Would like a call back today.

## 2016-09-06 NOTE — Telephone Encounter (Signed)
I reviewed recent research. Since this is likely nerve pain, the best thing to start with is slowly increasing her dose of gabapentin as tolerated. This targets nerve pain including pelvic area. Let me know if she agrees.

## 2016-09-06 NOTE — ED Triage Notes (Signed)
Pt bite her bottom lip. Pt states that her lip has not stopped bleeding since 4:40 pm today.

## 2016-09-07 ENCOUNTER — Other Ambulatory Visit: Payer: Self-pay | Admitting: Nurse Practitioner

## 2016-09-07 MED ORDER — GABAPENTIN 600 MG PO TABS: 600.0000 mg | ORAL_TABLET | Freq: Three times a day (TID) | ORAL | 2 refills | Status: DC

## 2016-09-07 NOTE — Telephone Encounter (Signed)
Spoke with patient and informed her per Sherie Donarolyn Hoskins, NP- Increased her Gabapentin to 600 mg three times a day. She can double up on her 300 mg for now until this is gone. Sent in new Rx. Unless there are problems with med, let us know in 2-3 weeks if this helps the pain. Patient verbalized understanding.

## 2016-09-07 NOTE — Telephone Encounter (Signed)
Tried to call no answer 09/07/16

## 2016-09-07 NOTE — Telephone Encounter (Signed)
Increased her Gabapentin to 600 mg three times a day. She can double up on her 300 mg for now until this is gone. Sent in new Rx. Unless there are problems with med, let us know in 2-3 weeks if this helps the pain.

## 2016-10-05 ENCOUNTER — Other Ambulatory Visit: Payer: Self-pay | Admitting: "Endocrinology

## 2016-10-05 LAB — T4, FREE: FREE T4: 1.4 ng/dL (ref 0.8–1.8)

## 2016-10-05 LAB — TSH: TSH: 0.35 m[IU]/L — AB

## 2016-10-06 LAB — COMPREHENSIVE METABOLIC PANEL
ALK PHOS: 60 U/L (ref 33–115)
ALT: 17 U/L (ref 6–29)
AST: 17 U/L (ref 10–30)
Albumin: 3.8 g/dL (ref 3.6–5.1)
BILIRUBIN TOTAL: 0.4 mg/dL (ref 0.2–1.2)
BUN: 13 mg/dL (ref 7–25)
CO2: 20 mmol/L (ref 20–31)
Calcium: 8.9 mg/dL (ref 8.6–10.2)
Chloride: 107 mmol/L (ref 98–110)
Creat: 0.81 mg/dL (ref 0.50–1.10)
GLUCOSE: 94 mg/dL (ref 65–99)
Potassium: 4.4 mmol/L (ref 3.5–5.3)
Sodium: 139 mmol/L (ref 135–146)
TOTAL PROTEIN: 6.4 g/dL (ref 6.1–8.1)

## 2016-10-06 LAB — HEMOGLOBIN A1C
HEMOGLOBIN A1C: 5.6 % (ref <5.7)
MEAN PLASMA GLUCOSE: 114 mg/dL

## 2016-10-06 LAB — LIPID PANEL
Cholesterol: 220 mg/dL — ABNORMAL HIGH (ref <200)
HDL: 36 mg/dL — ABNORMAL LOW (ref >50)
LDL Cholesterol: 129 mg/dL — ABNORMAL HIGH (ref <100)
Total CHOL/HDL Ratio: 6.1 Ratio — ABNORMAL HIGH (ref <5.0)
Triglycerides: 276 mg/dL — ABNORMAL HIGH (ref <150)
VLDL: 55 mg/dL — ABNORMAL HIGH (ref <30)

## 2016-10-11 ENCOUNTER — Encounter: Payer: Self-pay | Admitting: "Endocrinology

## 2016-10-11 ENCOUNTER — Ambulatory Visit (INDEPENDENT_AMBULATORY_CARE_PROVIDER_SITE_OTHER): Payer: Medicaid Other | Admitting: "Endocrinology

## 2016-10-11 VITALS — BP 131/85 | HR 62 | Ht 67.0 in | Wt 235.0 lb

## 2016-10-11 DIAGNOSIS — E890 Postprocedural hypothyroidism: Secondary | ICD-10-CM | POA: Diagnosis not present

## 2016-10-11 DIAGNOSIS — I10 Essential (primary) hypertension: Secondary | ICD-10-CM | POA: Diagnosis not present

## 2016-10-11 DIAGNOSIS — E782 Mixed hyperlipidemia: Secondary | ICD-10-CM

## 2016-10-11 DIAGNOSIS — Z6836 Body mass index (BMI) 36.0-36.9, adult: Secondary | ICD-10-CM | POA: Diagnosis not present

## 2016-10-11 MED ORDER — LEVOTHYROXINE SODIUM 150 MCG PO TABS: ORAL_TABLET | ORAL | 6 refills | Status: DC

## 2016-10-11 MED ORDER — ATORVASTATIN CALCIUM 20 MG PO TABS: 20.0000 mg | ORAL_TABLET | Freq: Every day | ORAL | 6 refills | Status: DC

## 2016-10-11 NOTE — Patient Instructions (Signed)

## 2016-10-11 NOTE — Progress Notes (Signed)
Subjective:    Patient ID: Teresa Vincent, female    DOB: April 21, 1980, PCP Teresa Kirschner, MD   Past Medical History:  Diagnosis Date  . Abnormal Pap smear of cervix   . ANXIETY 08/30/2007  . GRAVES' DISEASE 12/07/2008  . 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 INSOMNIA 08/30/2007  . MIGRAINE HEADACHE 08/30/2007  . Neck pain   . Renal disorder    kidney stone  . Stroke [facility])    Past Surgical History:  Procedure Laterality Date  . CARPAL TUNNEL RELEASE    . LUMBAR EPIDURAL INJECTION    . TOTAL ABDOMINAL HYSTERECTOMY W/ BILATERAL SALPINGOOPHORECTOMY N/A   . TUBAL LIGATION  2003   Social History   Social History  . Marital status: Divorced    Spouse name: N/A  . Number of children: N/A  . Years of education: N/A   Occupational History  . Doesn't work outside the home    Social History Main Topics  . Smoking status: Current Every Day Smoker    Packs/day: 1.00    Years: 26.00    Types: Cigarettes    Last attempt to quit: 06/29/2012  . Smokeless tobacco: Never Used  . Alcohol use 0.0 oz/week     Comment: occasional  . Drug use: No  . Sexual activity: Not Asked   Other Topics Concern  . None   Social History Narrative  . None   Outpatient Encounter Prescriptions as of 10/11/2016  Medication Sig  . albuterol (PROVENTIL HFA;VENTOLIN HFA) 108 (90 Base) MCG/ACT inhaler Inhale 1-2 puffs into the lungs every 4 (four) hours as needed for wheezing or shortness of breath.  . ALPRAZolam (XANAX) 0.5 MG tablet Take 1 tablet (0.5 mg total) by mouth at bedtime as needed. for sleep  . atorvastatin (LIPITOR) 20 MG tablet Take 1 tablet (20 mg total) by mouth daily.  . cyclobenzaprine (FLEXERIL) 10 MG tablet Take 1 tablet (10 mg total) by mouth 3 (three) times daily as needed for muscle spasms.  . diclofenac (VOLTAREN) 75 MG EC tablet Take 1 tablet (75 mg total) by mouth 2 (two) times daily.  Marland Kitchen gabapentin (NEURONTIN) 600 MG tablet Take 1 tablet (600 mg total) by mouth 3  (three) times daily.  Marland Kitchen levothyroxine (SYNTHROID, LEVOTHROID) 150 MCG tablet TAKE ONE TABLET BY MOUTH ONCE DAILY BEFORE BREAKFAST  . lidocaine (XYLOCAINE) 2 % jelly Apply 1 application topically as needed.  Marland Kitchen lisinopril (PRINIVIL,ZESTRIL) 10 MG tablet Take 1 tablet (10 mg total) by mouth daily.  . ondansetron (ZOFRAN ODT) 4 MG disintegrating tablet Take 1 tablet (4 mg total) by mouth every 8 (eight) hours as needed for nausea or vomiting.  Marland Kitchen oxyCODONE-acetaminophen (PERCOCET) 5-325 MG tablet Take 1-2 tablets by mouth every 4 (four) hours as needed.  . [DISCONTINUED] levothyroxine (SYNTHROID, LEVOTHROID) 150 MCG tablet TAKE ONE TABLET BY MOUTH ONCE DAILY BEFORE BREAKFAST   No facility-administered encounter medications on file as of 10/11/2016.    ALLERGIES: Allergies  Allergen Reactions  . Adhesive [Tape] Other (See Comments)    Skin irritation, patient prefers paper tape.   Sheliah Hatch [Citalopram Hydrobromide] Other (See Comments)    Make patient too carefree   VACCINATION STATUS: Immunization History  Administered Date(s) Administered  . Influenza-Unspecified 12/11/2015  . Td 05/11/2006    HPI  [name] is a 36 year old female patient with medical history as above. She is here to follow-up for hypothyroidism. Her history is significant for treatment for Graves' disease with RAI on 01/06/2009.  She was treated with various doses of levothyroxine over the years currently at 150 g by mouth every morning. She has been more consistent in taking her thyroid hormone. She complains of weight gain. - She has gained 8 pounds  of weight since last visit.  -She denies any family history of thyroid dysfunction. She denies any personal history of goiter nor family history of thyroid cancer. She denies heat or cold intolerance, palpitations, tremors.  Review of Systems  Constitutional: + weight gain,  + fatigue, no subjective hyperthermia/hypothermia Eyes: no blurry vision, no  xerophthalmia ENT: no sore throat, no nodules palpated in throat, no dysphagia/odynophagia, no hoarseness Cardiovascular: no CP/SOB/palpitations/leg swelling Respiratory: no cough/SOB Gastrointestinal: no N/V/D/C Musculoskeletal: no muscle/joint aches Skin: no rashes Neurological: no tremors/numbness/tingling/dizziness Psychiatric: no depression/anxiety  Objective:    BP 131/85   Pulse 62   Ht _0  (1.702 m)   Wt 235 lb (106.6 kg)   LMP 02/25/2016 Comment: irregular periods  BMI 36.81 kg/m   Wt Readings from Last 3 Encounters:  10/11/16 235 lb (106.6 kg)  09/06/16 234 lb (106.1 kg)  08/24/16 234 lb 8 oz (106.4 kg)    Physical Exam  Constitutional: overweight, in NAD Eyes: PERRLA, EOMI, no exophthalmos ENT: moist mucous membranes, no thyromegaly, no cervical lymphadenopathy Cardiovascular: RRR, No MRG Respiratory: CTA B Gastrointestinal: abdomen soft, NT, ND, BS+ Musculoskeletal: no deformities, strength intact in all 4 Skin: moist, warm, no rashes Neurological: no tremor with outstretched hands, DTR normal in all 4   Complete Blood Count (Most recent): Lab Results  Component Value Date   WBC 8.7 08/22/2016   HGB 14.9 08/22/2016   HCT 43.8 08/22/2016   MCV 90.7 08/22/2016   PLT 282 08/22/2016   Chemistry (most recent): Lab Results  Component Value Date   NA 139 10/05/2016   K 4.4 10/05/2016   CL 107 10/05/2016   CO2 20 10/05/2016   BUN 13 10/05/2016   CREATININE 0.81 10/05/2016   Recent Results (from the past 2160 hour(s))  POCT urinalysis dipstick     Status: None   Collection Time: 08/22/16  9:04 AM  Result Value Ref Range   Color, UA Yellow    Clarity, UA Clear    Glucose, UA     Bilirubin, UA     Ketones, UA ++    Spec Grav, UA 1.015 1.010 - 1.025   Blood, UA Negative    pH, UA 7.0 5.0 - 8.0   Protein, UA     Urobilinogen, UA 1.0 0.2 or 1.0 E.U./dL   Nitrite, UA     Leukocytes, UA Negative Negative  Amylase     Status: None   Collection  Time: 08/22/16  9:45 AM  Result Value Ref Range   Amylase 57 28 - 100 U/L  Lipase, blood     Status: None   Collection Time: 08/22/16  9:45 AM  Result Value Ref Range   Lipase 28 11 - 51 U/L  CBC with Differential/Platelet     Status: None   Collection Time: 08/22/16  9:45 AM  Result Value Ref Range   WBC 8.7 4.0 - 10.5 K/uL   RBC 4.83 3.87 - 5.11 MIL/uL   Hemoglobin 14.9 12.0 - 15.0 g/dL   HCT 43.8 36.0 - 46.0 %   MCV 90.7 78.0 - 100.0 fL   MCH 30.8 26.0 - 34.0 pg   MCHC 34.0 30.0 - 36.0 g/dL   RDW 13.6 11.5 - 15.5 %   Platelets  282 150 - 400 K/uL   Neutrophils Relative % 66 %   Neutro Abs 5.7 1.7 - 7.7 K/uL   Lymphocytes Relative 28 %   Lymphs Abs 2.4 0.7 - 4.0 K/uL   Monocytes Relative 5 %   Monocytes Absolute 0.4 0.1 - 1.0 K/uL   Eosinophils Relative 1 %   Eosinophils Absolute 0.1 0.0 - 0.7 K/uL   Basophils Relative 0 %   Basophils Absolute 0.0 0.0 - 0.1 K/uL  Basic metabolic panel     Status: Abnormal   Collection Time: 08/22/16  9:45 AM  Result Value Ref Range   Sodium 136 135 - 145 mmol/L   Potassium 3.9 3.5 - 5.1 mmol/L   Chloride 102 101 - 111 mmol/L   CO2 27 22 - 32 mmol/L   Glucose, Bld 101 (H) 65 - 99 mg/dL   BUN 17 6 - 20 mg/dL   Creatinine, Ser 0.83 0.44 - 1.00 mg/dL   Calcium 9.2 8.9 - 10.3 mg/dL   GFR calc non Af Amer >60 >60 mL/min   GFR calc Af Amer >60 >60 mL/min    Comment: (NOTE) The eGFR has been calculated using the CKD EPI equation. This calculation has not been validated in all clinical situations. eGFR's persistently <60 mL/min signify possible Chronic Kidney Disease.    Anion gap 7 5 - 15  Hepatic function panel     Status: Abnormal   Collection Time: 08/22/16  9:45 AM  Result Value Ref Range   Total Protein 7.5 6.5 - 8.1 g/dL   Albumin 4.1 3.5 - 5.0 g/dL   AST 16 15 - 41 U/L   ALT 17 14 - 54 U/L   Alkaline Phosphatase 58 38 - 126 U/L   Total Bilirubin 0.5 0.3 - 1.2 mg/dL   Bilirubin, Direct <0.1 (L) 0.1 - 0.5 mg/dL   Indirect  Bilirubin NOT CALCULATED 0.3 - 0.9 mg/dL  POCT urinalysis dipstick     Status: None   Collection Time: 08/24/16  3:58 PM  Result Value Ref Range   Color, UA Yellow    Clarity, UA Clear    Glucose, UA     Bilirubin, UA     Ketones, UA     Spec Grav, UA 1.025 1.010 - 1.025   Blood, UA     pH, UA 5.0 5.0 - 8.0   Protein, UA     Urobilinogen, UA  0.2 or 1.0 E.U./dL   Nitrite, UA     Leukocytes, UA Negative Negative  Comprehensive metabolic panel     Status: None   Collection Time: 10/05/16 10:00 AM  Result Value Ref Range   Sodium 139 135 - 146 mmol/L   Potassium 4.4 3.5 - 5.3 mmol/L   Chloride 107 98 - 110 mmol/L   CO2 20 20 - 31 mmol/L   Glucose, Bld 94 65 - 99 mg/dL   BUN 13 7 - 25 mg/dL   Creat 0.81 0.50 - 1.10 mg/dL   Total Bilirubin 0.4 0.2 - 1.2 mg/dL   Alkaline Phosphatase 60 33 - 115 U/L   AST 17 10 - 30 U/L   ALT 17 6 - 29 U/L   Total Protein 6.4 6.1 - 8.1 g/dL   Albumin 3.8 3.6 - 5.1 g/dL   Calcium 8.9 8.6 - 10.2 mg/dL  Lipid panel     Status: Abnormal   Collection Time: 10/05/16 10:00 AM  Result Value Ref Range   Cholesterol 220 (H) <200 mg/dL   Triglycerides  276 (H) <150 mg/dL   HDL 36 (L) >50 mg/dL   Total CHOL/HDL Ratio 6.1 (H) <5.0 Ratio   VLDL 55 (H) <30 mg/dL   LDL Cholesterol 129 (H) <100 mg/dL  TSH     Status: Abnormal   Collection Time: 10/05/16 10:00 AM  Result Value Ref Range   TSH 0.35 (L) mIU/L    Comment:   Reference Range   > or = 20 Years  0.40-4.50   Pregnancy Range First trimester  0.26-2.66 Second trimester 0.55-2.73 Third trimester  0.43-2.91     T4, free     Status: None   Collection Time: 10/05/16 10:00 AM  Result Value Ref Range   Free T4 1.4 0.8 - 1.8 ng/dL  Hemoglobin A1c     Status: None   Collection Time: 10/05/16 10:00 AM  Result Value Ref Range   Hgb A1c MFr Bld 5.6 <5.7 %    Comment:   For the purpose of screening for the presence of diabetes:   <5.7%       Consistent with the absence of diabetes 5.7-6.4 %    Consistent with increased risk for diabetes (prediabetes) >=6.5 %     Consistent with diabetes   This assay result is consistent with a decreased risk of diabetes.   Currently, no consensus exists regarding use of hemoglobin A1c for diagnosis of diabetes in children.   According to American Diabetes Association (ADA) guidelines, hemoglobin A1c <7.0% represents optimal control in non-pregnant diabetic patients. Different metrics may apply to specific patient populations. Standards of Medical Care in Diabetes (ADA).      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  I will continue levothyroxine  150 g by mouth every morning.   - We discussed about correct intake of levothyroxine, at fasting, with water, separated by at least 30 minutes from breakfast, and separated by more than 4 hours from calcium, iron, multivitamins, acid reflux medications (PPIs). -Patient is made aware of the fact that thyroid hormone replacement is needed for life, dose to be adjusted by periodic monitoring of thyroid function tests. -For weight gain, I have discussed a diet program higher in protein and lower in carbohydrates coupled with regular exercise combining aerobics, strength, and a stretch components. - She is interested in more options for weight loss. I discussed and gave her a brochure on bariatric surgery. She would benefit from this procedure given the fact that her BMI is now 36 along with hypertension and high cholesterol. -She admits that she drinks large quantities of fruit juices, I advised her to cut back on that.  2. Hyperlipidemia- new diagnosis - Her lipid parameters are significantly abnormal including high LDL and low HDL along with high triglycerides. - I discussed dietary recommendations and initiated atorvastatin 20 mg by mouth daily at bedtime.  3.hypertension:  controlled I advised her to continue  Lisinopril 10 mg by mouth daily.  - I advised patient to maintain close follow up with Teresa Kirschner, MD for primary care needs. Follow up plan: Return in about 6 months (around 04/13/2017) for follow up with pre-visit labs.  Glade Lloyd, MD Phone: [phone]  Fax: [phone]   10/11/2016, 9:26 AM

## 2016-11-06 ENCOUNTER — Telehealth: Payer: Self-pay | Admitting: Family Medicine

## 2016-11-06 NOTE — Telephone Encounter (Signed)
Spoke with patient and informed her per Dr. Lubertha South- No. As Teresa Vincent has stated, does not have a condition where long term narcotics are a good idea,realize pt likely has not signed a controlled substance contract but one of our policies stated in there is we do not write for more meds when stolen, narctics around the house invites this typoe of behavior, no narcotics today, Peggi to see this note. Patient verbalized understanding. Medication being sent into pharmacy.
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
## 2016-11-06 NOTE — Telephone Encounter (Signed)
Noted  

## 2016-11-06 NOTE — Telephone Encounter (Signed)
No. As Ceil has stated, does not have a conditionwhere long term narcotics are a good idea,realize pt likely has not signed a controlled substance contract but one of our policies stated in there is we do not srite for mopre meds when stolen, narctics around the house invites this typoe of behaviour, no narcotics today, Cecile to see this note

## 2016-11-15 ENCOUNTER — Telehealth: Payer: Self-pay | Admitting: *Deleted
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
## 2016-11-15 NOTE — Telephone Encounter (Signed)
Unfortunately, the state of [place] has started limiting our Rx of opiates/pain meds. We cannot replace stolen meds anymore because of new guidelines. Please see note from Dr. Brett CanalesSteve.

## 2016-11-16 ENCOUNTER — Encounter: Payer: Self-pay | Admitting: Nurse Practitioner

## 2016-11-16 NOTE — Telephone Encounter (Signed)
I called and left a vm asked that she r/c. 

## 2016-11-16 NOTE — Telephone Encounter (Signed)
Mychart message sent.

## 2016-11-16 NOTE — Telephone Encounter (Signed)
Patient states it is about time to refill med anyway and wants a new prescription- if you wont refill percocet she wants something else for pain or wants to know what to do about her pain

## 2016-11-18 ENCOUNTER — Other Ambulatory Visit: Payer: Self-pay | Admitting: Family Medicine

## 2016-11-18 ENCOUNTER — Other Ambulatory Visit: Payer: Self-pay | Admitting: "Endocrinology

## 2016-11-21 ENCOUNTER — Ambulatory Visit (INDEPENDENT_AMBULATORY_CARE_PROVIDER_SITE_OTHER): Payer: Medicaid Other | Admitting: Family Medicine

## 2016-11-21 ENCOUNTER — Encounter: Payer: Self-pay | Admitting: Family Medicine

## 2016-11-21 VITALS — BP 128/82 | Temp 98.2°F | Ht 67.0 in | Wt 234.0 lb

## 2016-11-21 DIAGNOSIS — J4520 Mild intermittent asthma, uncomplicated: Secondary | ICD-10-CM | POA: Diagnosis not present

## 2016-11-21 DIAGNOSIS — J329 Chronic sinusitis, unspecified: Secondary | ICD-10-CM

## 2016-11-21 DIAGNOSIS — J029 Acute pharyngitis, unspecified: Secondary | ICD-10-CM

## 2016-11-21 LAB — POCT RAPID STREP A (OFFICE): RAPID STREP A SCREEN: POSITIVE — AB

## 2016-11-21 MED ORDER — ALBUTEROL SULFATE HFA 108 (90 BASE) MCG/ACT IN AERS: 1.0000 | INHALATION_SPRAY | RESPIRATORY_TRACT | 0 refills | Status: DC | PRN

## 2016-11-21 MED ORDER — AMOXICILLIN-POT CLAVULANATE 875-125 MG PO TABS: 1.0000 | ORAL_TABLET | Freq: Two times a day (BID) | ORAL | 0 refills | Status: DC

## 2016-11-21 NOTE — Progress Notes (Signed)
   Subjective:    Patient ID: Teresa LanceCarolyn J Vincent, female    DOB: 02/14/1981, 36 y.o.   MRN: [id number]  HPI Patient Is here today for sore thraot and congestion, chest hurts, stopped up nose. Difficulty breathing for two days now. Has been using theraflu, which has not helped. Would like to be prescribed a inhaler.  Review of Systems Results for orders placed or performed in visit on 11/21/16  POCT rapid strep A  Result Value Ref Range   Rapid Strep A Screen Positive (A) Negative   Pos fever past few days  Cough productive and gunky  Dim energy   Notes sheezy and tight   Throat inflammed and painfulm  Feels bad with cough      Objective:   Physical Exam Alert, mild malaise. Hydration good Vitals stable. frontal/ maxillary tenderness evident positive nasal congestion. pharynx normal neck supple  lungs clear/no crackles Positive for wheezes. heart regular in rhythm        Assessment & Plan:  Impression rhinosinusitis likely post viral, discussed with patient. plan antibiotics prescribed. Ventolin prescribed. Questions answered. Symptomatic care discussed. warning signs discussed. WSL

## 2016-11-21 NOTE — Telephone Encounter (Signed)
Six mo worth ok 

## 2016-11-21 NOTE — Telephone Encounter (Signed)
Last seen 08/22/16

## 2016-11-23 ENCOUNTER — Other Ambulatory Visit: Payer: Self-pay

## 2016-11-23 MED ORDER — LEVOTHYROXINE SODIUM 150 MCG PO TABS: ORAL_TABLET | ORAL | 6 refills | Status: DC

## 2016-11-27 ENCOUNTER — Emergency Department (HOSPITAL_COMMUNITY): Payer: Medicaid Other

## 2016-11-27 ENCOUNTER — Other Ambulatory Visit: Payer: Self-pay

## 2016-11-27 ENCOUNTER — Emergency Department (HOSPITAL_COMMUNITY)
Admission: EM | Admit: 2016-11-27 | Discharge: 2016-11-27 | Disposition: A | Discharge status: Home / Self Care | Payer: Medicaid Other | Attending: Emergency Medicine | Admitting: Emergency Medicine

## 2016-11-27 ENCOUNTER — Telehealth: Payer: Self-pay | Admitting: Nurse Practitioner

## 2016-11-27 ENCOUNTER — Telehealth: Payer: Self-pay | Admitting: Family Medicine

## 2016-11-27 ENCOUNTER — Encounter (HOSPITAL_COMMUNITY): Payer: Self-pay | Admitting: *Deleted

## 2016-11-27 ENCOUNTER — Other Ambulatory Visit: Payer: Self-pay | Admitting: Nurse Practitioner

## 2016-11-27 DIAGNOSIS — Z8669 Personal history of other diseases of the nervous system and sense organs: Secondary | ICD-10-CM | POA: Diagnosis not present

## 2016-11-27 DIAGNOSIS — R51 Headache: Secondary | ICD-10-CM | POA: Insufficient documentation

## 2016-11-27 DIAGNOSIS — R062 Wheezing: Secondary | ICD-10-CM | POA: Diagnosis not present

## 2016-11-27 DIAGNOSIS — I10 Essential (primary) hypertension: Secondary | ICD-10-CM | POA: Diagnosis not present

## 2016-11-27 DIAGNOSIS — J209 Acute bronchitis, unspecified: Secondary | ICD-10-CM

## 2016-11-27 DIAGNOSIS — E039 Hypothyroidism, unspecified: Secondary | ICD-10-CM | POA: Insufficient documentation

## 2016-11-27 DIAGNOSIS — F17210 Nicotine dependence, cigarettes, uncomplicated: Secondary | ICD-10-CM | POA: Diagnosis not present

## 2016-11-27 DIAGNOSIS — Z79899 Other long term (current) drug therapy: Secondary | ICD-10-CM | POA: Insufficient documentation

## 2016-11-27 DIAGNOSIS — R05 Cough: Secondary | ICD-10-CM | POA: Diagnosis present

## 2016-11-27 DIAGNOSIS — R079 Chest pain, unspecified: Secondary | ICD-10-CM | POA: Diagnosis not present

## 2016-11-27 DIAGNOSIS — H05253 Intermittent exophthalmos, bilateral: Secondary | ICD-10-CM | POA: Diagnosis not present

## 2016-11-27 MED ORDER — METOCLOPRAMIDE HCL 5 MG/ML IJ SOLN: 10.0000 mg | Freq: Once | INTRAMUSCULAR | Status: DC

## 2016-11-27 MED ORDER — DEXAMETHASONE SODIUM PHOSPHATE 10 MG/ML IJ SOLN: 10.0000 mg | Freq: Once | INTRAMUSCULAR | Status: DC

## 2016-11-27 MED ORDER — SODIUM CHLORIDE 0.9 % IV BOLUS (SEPSIS): 1000.0000 mL | Freq: Once | INTRAVENOUS | Status: DC

## 2016-11-27 MED ORDER — IPRATROPIUM BROMIDE 0.02 % IN SOLN: 0.5000 mg | Freq: Once | RESPIRATORY_TRACT | Status: DC

## 2016-11-27 MED ORDER — BENZONATATE 100 MG PO CAPS: ORAL_CAPSULE | ORAL | 0 refills | Status: DC

## 2016-11-27 MED ORDER — ALBUTEROL (5 MG/ML) CONTINUOUS INHALATION SOLN: 10.0000 mg/h | INHALATION_SOLUTION | Freq: Once | RESPIRATORY_TRACT | Status: DC

## 2016-11-27 MED ORDER — DIPHENHYDRAMINE HCL 50 MG/ML IJ SOLN: 25.0000 mg | Freq: Once | INTRAMUSCULAR | Status: DC

## 2016-11-27 MED ORDER — TRAMADOL HCL 50 MG PO TABS: 50.0000 mg | ORAL_TABLET | Freq: Three times a day (TID) | ORAL | 0 refills | Status: DC | PRN

## 2016-11-27 NOTE — Telephone Encounter (Signed)
Patient wanted to let Eber JonesCarolyn know that she is taking the Gabapentin and she can take Tramadol.  She was unable to respond back to her mychart message from Harveyarolyn because it was too old.

## 2016-11-27 NOTE — ED Notes (Signed)
Patient transported to X-ray 

## 2016-11-27 NOTE — ED Notes (Signed)
Pt out in hallway arguing with Dr Lynelle DoctorKnapp, Ival BibleGina AC at nursing desk as well, pt yelling " I dont want anything from you talking to Dr Lynelle DoctorKnapp. Pt walked out of department, refusing any further treatment,

## 2016-11-27 NOTE — ED Triage Notes (Addendum)
Pt c/o cough, sob, congestion, headache for the past few days, currently being treated for strep throat, pt also admits to "heart flutters" which are not new for her, pt unable to state if they have gotten worse,
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
## 2016-11-27 NOTE — Telephone Encounter (Signed)
Faxed to the pharmacy.

## 2016-11-27 NOTE — Telephone Encounter (Signed)
Patient is aware of all and aware that we have [facility]. She is aware that Sherie Donarolyn Hoskins sent in tramadol to the drug store as well. Also I asked Dr. Brett CanalesSteve if the benadryl is ok to take with the Tramadol, per verbal yes ok.Patient made aware.

## 2016-11-27 NOTE — Telephone Encounter (Signed)
Patient seen Dr. Brett CanalesSteve on 11/21/16 for rhinosinusitis.  She said her cough is so bad that it is keeping her up at night.  She coughs until her ribs are sore, and has a headache from the cough.  She is requesting something to be called in for the cough and also something to help her sleep.  [facility] [place]

## 2016-11-27 NOTE — ED Notes (Signed)
Pt out to nursing desk upset with wait time, states " nobody has been in to see me" delay explained to pt, pt requesting xray results, Rn advised that the edp would need to give her those results or mychart. Pt requesting to see supervisor, Ival BibleGina AC notified,

## 2016-11-27 NOTE — Telephone Encounter (Signed)
See messaging of ast few weeks, bad idea to give thiw pt narcotic continging cough meds, simply let pt know we wont rx those, can add tess perles 100 bgne qhs prn cough, 24, not sure if nedicaid covrs, ad benadry 25 qhs for sleep

## 2016-11-27 NOTE — Telephone Encounter (Signed)
Printed order for Tramadol

## 2016-11-27 NOTE — ED Notes (Signed)
ED Provider at bedside. 

## 2016-11-28 ENCOUNTER — Other Ambulatory Visit: Payer: Self-pay

## 2016-11-28 MED ORDER — LEVOTHYROXINE SODIUM 150 MCG PO TABS: ORAL_TABLET | ORAL | 2 refills | Status: DC

## 2016-12-11 ENCOUNTER — Emergency Department (HOSPITAL_COMMUNITY)
Admission: EM | Admit: 2016-12-11 | Discharge: 2016-12-11 | Disposition: A | Discharge status: Home / Self Care | Payer: Medicaid Other | Attending: Emergency Medicine | Admitting: Emergency Medicine

## 2016-12-11 ENCOUNTER — Emergency Department (HOSPITAL_COMMUNITY): Payer: Medicaid Other

## 2016-12-11 ENCOUNTER — Encounter (HOSPITAL_COMMUNITY): Payer: Self-pay | Admitting: Emergency Medicine

## 2016-12-11 DIAGNOSIS — R197 Diarrhea, unspecified: Secondary | ICD-10-CM | POA: Diagnosis not present

## 2016-12-11 DIAGNOSIS — E039 Hypothyroidism, unspecified: Secondary | ICD-10-CM | POA: Diagnosis not present

## 2016-12-11 DIAGNOSIS — I10 Essential (primary) hypertension: Secondary | ICD-10-CM | POA: Diagnosis not present

## 2016-12-11 DIAGNOSIS — R1011 Right upper quadrant pain: Secondary | ICD-10-CM | POA: Insufficient documentation

## 2016-12-11 DIAGNOSIS — Z79899 Other long term (current) drug therapy: Secondary | ICD-10-CM | POA: Diagnosis not present

## 2016-12-11 DIAGNOSIS — R110 Nausea: Secondary | ICD-10-CM | POA: Diagnosis not present

## 2016-12-11 DIAGNOSIS — F17210 Nicotine dependence, cigarettes, uncomplicated: Secondary | ICD-10-CM | POA: Insufficient documentation

## 2016-12-11 HISTORY — DX: Dorsalgia, unspecified: M54.9

## 2016-12-11 HISTORY — DX: Sciatica, unspecified side: M54.30

## 2016-12-11 HISTORY — DX: Other chronic pain: G89.29

## 2016-12-11 LAB — COMPREHENSIVE METABOLIC PANEL
ALK PHOS: 63 U/L (ref 38–126)
ALT: 19 U/L (ref 14–54)
AST: 18 U/L (ref 15–41)
Albumin: 4.2 g/dL (ref 3.5–5.0)
Anion gap: 9 (ref 5–15)
BILIRUBIN TOTAL: 0.4 mg/dL (ref 0.3–1.2)
BUN: 12 mg/dL (ref 6–20)
CO2: 26 mmol/L (ref 22–32)
Calcium: 9.3 mg/dL (ref 8.9–10.3)
Chloride: 104 mmol/L (ref 101–111)
Creatinine, Ser: 0.85 mg/dL (ref 0.44–1.00)
Glucose, Bld: 91 mg/dL (ref 65–99)
Potassium: 4.4 mmol/L (ref 3.5–5.1)
Sodium: 139 mmol/L (ref 135–145)
TOTAL PROTEIN: 7.5 g/dL (ref 6.5–8.1)

## 2016-12-11 LAB — LIPASE, BLOOD: LIPASE: 34 U/L (ref 11–51)

## 2016-12-11 LAB — CBC
HEMATOCRIT: 42.9 % (ref 36.0–46.0)
HEMOGLOBIN: 14.7 g/dL (ref 12.0–15.0)
MCH: 31.4 pg (ref 26.0–34.0)
MCHC: 34.3 g/dL (ref 30.0–36.0)
MCV: 91.7 fL (ref 78.0–100.0)
PLATELETS: 259 10*3/uL (ref 150–400)
RBC: 4.68 MIL/uL (ref 3.87–5.11)
RDW: 13.4 % (ref 11.5–15.5)
WBC: 9.4 10*3/uL (ref 4.0–10.5)

## 2016-12-11 LAB — URINALYSIS, ROUTINE W REFLEX MICROSCOPIC
BILIRUBIN URINE: NEGATIVE
GLUCOSE, UA: NEGATIVE mg/dL
HGB URINE DIPSTICK: NEGATIVE
KETONES UR: NEGATIVE mg/dL
Leukocytes, UA: NEGATIVE
Nitrite: NEGATIVE
PH: 6 (ref 5.0–8.0)
Protein, ur: NEGATIVE mg/dL
Specific Gravity, Urine: 1.012 (ref 1.005–1.030)

## 2016-12-11 MED ORDER — FAMOTIDINE IN NACL 20-0.9 MG/50ML-% IV SOLN
20.0000 mg | Freq: Once | INTRAVENOUS | Status: AC
  Administered 2016-12-11: 20 mg via INTRAVENOUS
  Filled 2016-12-11: qty 50

## 2016-12-11 MED ORDER — HYDROCODONE-ACETAMINOPHEN 5-325 MG PO TABS: ORAL_TABLET | ORAL | 0 refills | Status: DC

## 2016-12-11 MED ORDER — IOPAMIDOL (ISOVUE-300) INJECTION 61%
100.0000 mL | Freq: Once | INTRAVENOUS | Status: AC | PRN
  Administered 2016-12-11: 100 mL via INTRAVENOUS

## 2016-12-11 MED ORDER — MORPHINE SULFATE (PF) 4 MG/ML IV SOLN
4.0000 mg | INTRAVENOUS | Status: DC | PRN
  Administered 2016-12-11: 4 mg via INTRAVENOUS
  Filled 2016-12-11: qty 1

## 2016-12-11 MED ORDER — ONDANSETRON HCL 4 MG/2ML IJ SOLN
4.0000 mg | INTRAMUSCULAR | Status: DC | PRN
  Administered 2016-12-11: 4 mg via INTRAVENOUS
  Filled 2016-12-11: qty 2

## 2016-12-11 MED ORDER — FAMOTIDINE 20 MG PO TABS: 20.0000 mg | ORAL_TABLET | Freq: Two times a day (BID) | ORAL | 0 refills | Status: DC

## 2016-12-11 MED ORDER — ONDANSETRON 4 MG PO TBDP: 4.0000 mg | ORAL_TABLET | Freq: Three times a day (TID) | ORAL | 0 refills | Status: DC | PRN

## 2016-12-11 NOTE — Discharge Instructions (Signed)
Eat a bland diet, avoiding greasy, fatty, fried foods, as well as spicy and acidic foods or beverages.  Avoid eating within 2 to 3 hours before going to bed or laying down.  Also avoid teas, colas, coffee, chocolate, pepermint and spearment.  Take the prescriptions as directed.  May also take over the counter maalox/mylanta, as directed on packaging, as needed for discomfort.  Call your regular medical doctor tomorrow to schedule a follow up appointment in the next 2 days. Call the GI doctor and the General Surgeon tomorrow to schedule a follow up appointment within the next week.  Return to the Emergency Department immediately if worsening.

## 2016-12-11 NOTE — ED Notes (Signed)
Patient transported to X-ray 

## 2016-12-11 NOTE — ED Provider Notes (Signed)
[facility]ER[facility]Y DEPT Provider Note   CSN: [id number] Arrival date & time: 12/11/16  1324     History   Chief Complaint Chief Complaint  Patient presents with  . Abdominal Pain    HPI Teresa Vincent is a 36 y.o. female.  HPI  Pt was seen at 1725.  Per pt, c/o gradual onset and persistence of constant RUQ abd "pain" for the past 9 days. Pt states the pain was intermittent for 4 days preceding. Pain worsens after eating. Has been associated with nausea and loose stools.  Describes the abd pain as "aching," with radiation into her right flank.  Denies N/V, no diarrhea, no fevers, no back pain, no rash, no CP/SOB, no black or blood in stools, no dysuria/hematuria.      Past Medical History:  Diagnosis Date  . Abnormal Pap smear of cervix   . ANXIETY 08/30/2007  . Chronic back pain   . GRAVES' DISEASE 12/07/2008  . 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 INSOMNIA 08/30/2007  . Kidney stone   . MIGRAINE HEADACHE 08/30/2007  . Neck pain   . Sciatica   . Stroke [facility])     Patient Active Problem List   Diagnosis Date Noted  . Mixed hyperlipidemia 10/11/2016  . 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/10/2015  . Obesity due to excess calories 06/10/2015  . Left-sided low back pain with left-sided sciatica 05/03/2015  . Essential hypertension, benign 12/30/2012  . Esophageal reflux 12/30/2012  . 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 ANXIETY 08/30/2007  . MIGRAINE HEADACHE 08/30/2007  . Insomnia 08/30/2007    Past Surgical History:  Procedure Laterality Date  . CARPAL TUNNEL RELEASE    . LUMBAR EPIDURAL INJECTION    . TOTAL ABDOMINAL HYSTERECTOMY W/ BILATERAL SALPINGOOPHORECTOMY N/A   . TUBAL LIGATION  2003    OB History    Gravida Para Term Preterm AB Living   SAB TAB Ectopic Multiple Live Births                   Home Medications    Prior to Admission medications   Medication Sig Start Date  End Date Taking? Authorizing Provider  albuterol (PROVENTIL HFA;VENTOLIN HFA) 108 (90 Base) MCG/ACT inhaler Inhale 1-2 puffs into the lungs every 4 (four) hours as needed for wheezing or shortness of breath. 11/21/16   [name], MD  ALPRAZolam Prudy Feeler) 0.5 MG tablet TAKE 1 TABLET BY MOUTH AT BEDTIME AS NEEDED FOR  SLEEP 11/21/16   [name], MD  amoxicillin-clavulanate (AUGMENTIN) 875-125 MG tablet Take 1 tablet by mouth 2 (two) times daily. 11/21/16   [name], MD  atorvastatin (LIPITOR) 20 MG tablet Take 1 tablet (20 mg total) by mouth daily. 10/11/16   Roma Kayser, MD  benzonatate (TESSALON) 100 MG capsule One po QHS prn cough 11/27/16   [name], MD  cyclobenzaprine (FLEXERIL) 10 MG tablet Take 1 tablet (10 mg total) by mouth 3 (three) times daily as needed for muscle spasms. 02/23/16   Devoria Albe, MD  diclofenac (VOLTAREN) 75 MG EC tablet Take 1 tablet (75 mg total) by mouth 2 (two) times daily. 03/31/16   Campbell Riches, NP  gabapentin (NEURONTIN) 600 MG tablet Take 1 tablet (600 mg total) by mouth 3 (three) times daily. 09/07/16   Campbell Riches, NP  levothyroxine (SYNTHROID, LEVOTHROID) 150 MCG tablet TAKE ONE TABLET BY MOUTH ONCE  DAILY BEFORE BREAKFAST 11/23/16   Roma Kayser, MD  levothyroxine (SYNTHROID, LEVOTHROID) 150 MCG tablet TAKE ONE TABLET BY MOUTH ONCE DAILY BEFORE  BREAKFAST 11/28/16   Roma Kayser, MD  lidocaine (XYLOCAINE) 2 % jelly Apply 1 application topically as needed. 06/01/16   Campbell Riches, NP  lisinopril (PRINIVIL,ZESTRIL) 10 MG tablet Take 1 tablet (10 mg total) by mouth daily. 05/05/16   [name], MD  ondansetron (ZOFRAN ODT) 4 MG disintegrating tablet Take 1 tablet (4 mg total) by mouth every 8 (eight) hours as needed for nausea or vomiting. Patient not taking: Reported on 11/21/2016 08/22/16   [name], MD  traMADol (ULTRAM) 50 MG tablet Take 1 tablet (50 mg total) by mouth every 8 (eight) hours  as needed. 11/27/16   Campbell Riches, NP    Family History Family History  Problem Relation Age of Onset  . Hypertension Mother   . Thyroid disease Neg Hx     Social History Social History  Substance Use Topics  . Smoking status: Current Every Day Smoker    Packs/day: 1.00    Years: 26.00    Types: Cigarettes    Last attempt to quit: 06/29/2012  . Smokeless tobacco: Never Used  . Alcohol use 0.0 oz/week     Comment: occasional     Allergies   Adhesive [tape] and Celexa [citalopram hydrobromide]   Review of Systems Review of Systems ROS: Statement: All systems negative except as marked or noted in the HPI; Constitutional: Negative for fever and chills. ; ; Eyes: Negative for eye pain, redness and discharge. ; ; ENMT: Negative for ear pain, hoarseness, nasal congestion, sinus pressure and sore throat. ; ; Cardiovascular: Negative for chest pain, palpitations, diaphoresis, dyspnea and peripheral edema. ; ; Respiratory: Negative for cough, wheezing and stridor. ; ; Gastrointestinal: +nausea, loose stools, abd pain. Negative for vomiting, diarrhea, blood in stool, hematemesis, jaundice and rectal bleeding. . ; ; Genitourinary: Negative for dysuria, flank pain and hematuria. ; ; Musculoskeletal: Negative for back pain and neck pain. Negative for swelling and trauma.; ; Skin: Negative for pruritus, rash, abrasions, blisters, bruising and skin lesion.; ; Neuro: Negative for headache, lightheadedness and neck stiffness. Negative for weakness, altered level of consciousness, altered mental status, extremity weakness, paresthesias, involuntary movement, seizure and syncope.       Physical Exam Updated Vital Signs BP 123/84 (BP Location: Left Arm)   Pulse 72   Temp 98.2 F (36.8 C) (Oral)   Resp 18   Ht  (1.702 m)   Wt 106.1 kg (234 lb)   LMP 02/25/2016 Comment: irregular periods  SpO2 99%   BMI 36.65 kg/m   Physical Exam 1730: Physical examination:  Nursing notes  reviewed; Vital signs and O2 SAT reviewed;  Constitutional: Well developed, Well nourished, Well hydrated, In no acute distress; Head:  Normocephalic, atraumatic; Eyes: EOMI, PERRL, No scleral icterus; ENMT: Mouth and pharynx normal, Mucous membranes moist; Neck: Supple, Full range of motion, No lymphadenopathy; Cardiovascular: Regular rate and rhythm, No gallop; Respiratory: Breath sounds clear & equal bilaterally, No wheezes.  Speaking full sentences with ease, Normal respiratory effort/excursion; Chest: Nontender, Movement normal; Abdomen: Soft, +RUQ and mid-epigastric tenderness to palp. No rebound or guarding. Nondistended, Normal bowel sounds; Genitourinary: No CVA tenderness; Extremities: Pulses normal, No tenderness, No edema, No calf edema or asymmetry.; Neuro: AA&Ox3, Major CN grossly intact.  Speech clear. No gross focal motor or sensory deficits in extremities.; Skin: Color normal, Warm,  Dry.   ED Treatments / Results  Labs (all labs ordered are listed, but only abnormal results are displayed)   EKG  EKG Interpretation None       Radiology   Procedures Procedures (including critical care time)  Medications Ordered in ED Medications  ondansetron (ZOFRAN) injection 4 mg (not administered)  morphine 4 MG/ML injection 4 mg (not administered)     Initial Impression / Assessment and Plan / ED Course  I have reviewed the triage vital signs and the nursing notes.  Pertinent labs & imaging results that were available during my care of the patient were reviewed by me and considered in my medical decision making (see chart for details).  MDM Reviewed: previous chart, nursing note and vitals Reviewed previous: labs Interpretation: labs, x-ray and CT scan   Results for orders placed or performed during the hospital encounter of 12/11/16  Lipase, blood  Result Value Ref Range   Lipase 34 11 - 51 U/L  Comprehensive metabolic panel  Result Value Ref Range   Sodium 139 135 -  145 mmol/L   Potassium 4.4 3.5 - 5.1 mmol/L   Chloride 104 101 - 111 mmol/L   CO2 26 22 - 32 mmol/L   Glucose, Bld 91 65 - 99 mg/dL   BUN 12 6 - 20 mg/dL   Creatinine, Ser 2.13 0.44 - 1.00 mg/dL   Calcium 9.3 8.9 - 08.6 mg/dL   Total Protein 7.5 6.5 - 8.1 g/dL   Albumin 4.2 3.5 - 5.0 g/dL   AST 18 15 - 41 U/L   ALT 19 14 - 54 U/L   Alkaline Phosphatase 63 38 - 126 U/L   Total Bilirubin 0.4 0.3 - 1.2 mg/dL   GFR calc non Af Amer >60 >60 mL/min   GFR calc Af Amer >60 >60 mL/min   Anion gap 9 5 - 15  CBC  Result Value Ref Range   WBC 9.4 4.0 - 10.5 K/uL   RBC 4.68 3.87 - 5.11 MIL/uL   Hemoglobin 14.7 12.0 - 15.0 g/dL   HCT 57.8 46.9 - 62.9 %   MCV 91.7 78.0 - 100.0 fL   MCH 31.4 26.0 - 34.0 pg   MCHC 34.3 30.0 - 36.0 g/dL   RDW 52.8 41.3 - 24.4 %   Platelets 259 150 - 400 K/uL  Urinalysis, Routine w reflex microscopic  Result Value Ref Range   Color, Urine YELLOW YELLOW   APPearance CLEAR CLEAR   Specific Gravity, Urine 1.012 1.005 - 1.030   pH 6.0 5.0 - 8.0   Glucose, UA NEGATIVE NEGATIVE mg/dL   Hgb urine dipstick NEGATIVE NEGATIVE   Bilirubin Urine NEGATIVE NEGATIVE   Ketones, ur NEGATIVE NEGATIVE mg/dL   Protein, ur NEGATIVE NEGATIVE mg/dL   Nitrite NEGATIVE NEGATIVE   Leukocytes, UA NEGATIVE NEGATIVE   Dg Chest 2 View Result Date: 12/11/2016 CLINICAL DATA:  Upper abdominal pain radiating to the flank and groin EXAM: CHEST  2 VIEW COMPARISON:  11/27/2016 FINDINGS: Mild bronchitic changes. No acute consolidation or effusion. Normal cardiomediastinal silhouette. No pneumothorax. IMPRESSION: No active cardiopulmonary disease. Electronically Signed   By: Jasmine Pang M.D.   On: 12/11/2016 18:13    Ct Abdomen Pelvis W Contrast Result Date: 12/11/2016 CLINICAL DATA:  Right upper abdominal pain radiating to the flank EXAM: CT ABDOMEN AND PELVIS WITH CONTRAST TECHNIQUE: Multidetector CT imaging of the abdomen and pelvis was performed using the standard protocol following  bolus administration of intravenous contrast. CONTRAST:   ISOVUE-300 IOPAMIDOL (ISOVUE-300) INJECTION 61% COMPARISON:  08/22/2016 radiograph, 03/21/2016 CT FINDINGS: Lower chest: Lung bases demonstrate no acute consolidation or pleural effusion. Normal heart size. Hepatobiliary: No focal hepatic abnormality. Probable non calcified stone in the gallbladder. No wall thickening or surrounding fluid. Negative for biliary dilatation Pancreas: Unremarkable. No pancreatic ductal dilatation or surrounding inflammatory changes. Spleen: Normal in size without focal abnormality. Adrenals/Urinary Tract: Adrenal glands are within normal limits. 2.2 cm exophytic cyst mid right kidney. subcentimeter hypodense lesions in the left kidney too small to further characterize. Right greater than left intrarenal calculi,, measuring up to 4 mm in the lower pole on the right. The bladder is normal. Stomach/Bowel: Stomach is within normal limits. Appendix appears normal. No evidence of bowel wall thickening, distention, or inflammatory changes. Vascular/Lymphatic: Aortic atherosclerosis. No enlarged abdominal or pelvic lymph nodes.Choose 1 Reproductive: Prostate is unremarkable. Other: Negative for free air or free fluid. Small fat in the umbilicus. Musculoskeletal: Chronic bilateral pars defect at L5. No acute or suspicious bone lesion. IMPRESSION: 1. No CT evidence for acute intra-abdominal or pelvic abnormality. 2. Probable non calcified stone in the gallbladder. Ultrasound correlation as indicated 3. Nonobstructing stones within both kidneys 4. Negative appendix 5. Chronic bilateral pars defect at L5 Electronically Signed   By: Jasmine Pang M.D.   On: 12/11/2016 18:22    1835:  Pt has tol PO well while in the ED without N/V.  No stooling while in the ED. VSS. Workup reassuring. Feels better and wants to go home now. Tx symptomatically, f/u GI MD, PMD, General Surgeon. Dx and testing d/w pt.  Questions answered.  Verb  understanding, agreeable to d/c home with outpt f/u.    Final Clinical Impressions(s) / ED Diagnoses   Final diagnoses:  None    New Prescriptions New Prescriptions   No medications on file     Samuel Jester, DO 12/15/16 1648

## 2016-12-11 NOTE — ED Triage Notes (Signed)
Patient c/o right upper abd pain that radiates into right flank and right lower abd/groin. Patient states constant pain x8 days. Patient states that called PCP and was told to come to ER for possible gallbladder. Patient reports fever yesterday with nausea and change in stools. Denies diarrhea but states soft stools. Hx of kidney stones as well but denies any urinary symptoms.

## 2016-12-13 ENCOUNTER — Encounter: Payer: Self-pay | Admitting: Family Medicine

## 2016-12-13 ENCOUNTER — Ambulatory Visit (INDEPENDENT_AMBULATORY_CARE_PROVIDER_SITE_OTHER): Payer: Medicaid Other | Admitting: Family Medicine

## 2016-12-13 VITALS — BP 110/72 | Temp 98.0°F | Ht 67.0 in | Wt 235.0 lb

## 2016-12-13 DIAGNOSIS — R1011 Right upper quadrant pain: Secondary | ICD-10-CM

## 2016-12-13 DIAGNOSIS — M545 Low back pain: Secondary | ICD-10-CM | POA: Diagnosis not present

## 2016-12-13 DIAGNOSIS — I10 Essential (primary) hypertension: Secondary | ICD-10-CM

## 2016-12-13 DIAGNOSIS — G8929 Other chronic pain: Secondary | ICD-10-CM | POA: Diagnosis not present

## 2016-12-13 DIAGNOSIS — K8020 Calculus of gallbladder without cholecystitis without obstruction: Secondary | ICD-10-CM

## 2016-12-13 MED ORDER — ALBUTEROL SULFATE HFA 108 (90 BASE) MCG/ACT IN AERS: 1.0000 | INHALATION_SPRAY | RESPIRATORY_TRACT | 3 refills | Status: DC | PRN

## 2016-12-13 MED ORDER — CYCLOBENZAPRINE HCL 10 MG PO TABS: 10.0000 mg | ORAL_TABLET | Freq: Three times a day (TID) | ORAL | 3 refills | Status: DC | PRN

## 2016-12-13 NOTE — Progress Notes (Signed)
   Subjective:    Patient ID: Teresa Vincent, female    DOB: 05-31-1980, 36 y.o.   MRN: [id number] Patient comes back for long discussion following visits emergency room HPIFollow up ED visit. Still having pain in RUQ and nausea. Pt called herself to call Dr. Lovell Sheehan and Kendell Bane to make follow up appt. Pt called and has not heard back from either office yet.   Chest congestion. Finished augmentin. Would like a refill on albuterol inhaler.gets occasional wheezes.  +  Needs refill on flexeril for back spasms. Low back pain spastic at times. Suddenly grabs her. Minimal radiation  Pt is ready to quit smoking.   Review of Systems No headache, no major weight loss or weight gain, no chest pain no back pain abdominal pain no change in bowel habits complete ROS otherwise negative     Objective:   Physical Exam Alert and oriented, vitals reviewed and stable, NAD ENT-TM's and ext canals WNL bilat via otoscopic exam Soft palate, tonsils and post pharynx WNL via oropharyngeal exam Neck-symmetric, no masses; thyroid nonpalpable and nontender Pulmonary-no tachypnea or accessory muscle use; Clear without wheezes via auscultation Card--no abnrml murmurs, rhythm reg and rate WNL Carotid pulses symmetric, without bruits Right costal margin distinctly tender. Right upper quadrant mildly sensitive. No CVA tenderness. Positive low back pain to percussion. Negative straight leg raise.       Assessment & Plan:  Impression right upper quadrant symptoms with known cholelithiasis.ull ER report and all results reviewed with and presence of patient. I am not convinced that this represents gallbladder pain. Discussed with patient. Chest wall distinctly tender we'll press on with surgery referral as recommended per ER but also do ultrasound  #2 chronic reactive airways discussed maintain meds  #3 back pain likely lumbar strain in nature. Maintain Flexeril when necessary.  Smoking cessation  discussed  Greater than 50% of this 25 minute face to face visit was spent in counseling and discussion and coordination of care regarding the above diagnosis/diagnosies

## 2016-12-19 ENCOUNTER — Ambulatory Visit (HOSPITAL_COMMUNITY)
Admission: RE | Admit: 2016-12-19 | Discharge: 2016-12-19 | Disposition: A | Discharge status: Home / Self Care | Payer: Medicaid Other | Source: Ambulatory Visit | Attending: Family Medicine | Admitting: Family Medicine

## 2016-12-19 ENCOUNTER — Encounter: Payer: Self-pay | Admitting: Family Medicine

## 2016-12-19 DIAGNOSIS — K8020 Calculus of gallbladder without cholecystitis without obstruction: Secondary | ICD-10-CM | POA: Insufficient documentation

## 2016-12-19 DIAGNOSIS — R1011 Right upper quadrant pain: Secondary | ICD-10-CM | POA: Diagnosis present

## 2016-12-26 ENCOUNTER — Ambulatory Visit (INDEPENDENT_AMBULATORY_CARE_PROVIDER_SITE_OTHER): Payer: Medicaid Other | Admitting: General Surgery

## 2016-12-26 ENCOUNTER — Encounter: Payer: Self-pay | Admitting: General Surgery

## 2016-12-26 ENCOUNTER — Encounter (HOSPITAL_COMMUNITY): Payer: Self-pay

## 2016-12-26 VITALS — BP 167/105 | HR 86 | Temp 97.5°F | Resp 18 | Ht 67.0 in | Wt 236.0 lb

## 2016-12-26 DIAGNOSIS — K8020 Calculus of gallbladder without cholecystitis without obstruction: Secondary | ICD-10-CM | POA: Diagnosis not present

## 2016-12-26 NOTE — Progress Notes (Signed)
[facility] History and Physical  Reason for Referral: Cholelithiasis  Referring Physician: Dr. Gerda Diss  Chief Complaint    Abdominal Pain      Teresa Vincent is a 36 y.o. female.  HPI: Teresa Vincent is a very pleasant 36 yo with reported 3 week history of RUQ and epigastric pain that has been constant but made worse with food.  The pain started with epigastric pain that would move to her RUQ after eating any type of food, and now has started to be constant in the RUQ.  It is associated with nausea but no vomiting.  The pain is not improved with anything at this time, and Teresa Vincent did have to get some pain medication which helped somewhat initially but is now not improving the pain.  The pain is made worse with laying on that side and eating and Teresa Vincent has a chronic discomfort in the area.    Past Medical History:  Diagnosis Date  . Abnormal Pap smear of cervix   . ANXIETY 08/30/2007  . Chronic back pain   . GRAVES' DISEASE 12/07/2008  . 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 INSOMNIA 08/30/2007  . Kidney stone   . MIGRAINE HEADACHE 08/30/2007  . Neck pain   . Sciatica   . Stroke [facility])     Past Surgical History:  Procedure Laterality Date  . CARPAL TUNNEL RELEASE    . LUMBAR EPIDURAL INJECTION    . TOTAL ABDOMINAL HYSTERECTOMY W/ BILATERAL SALPINGOOPHORECTOMY N/A   . TUBAL LIGATION  2003    Family History  Problem Relation Age of Onset  . Hypertension Mother   . Thyroid disease Neg Hx     Social History  Substance Use Topics  . Smoking status: Current Every Day Smoker    Packs/day: 1.00    Years: 26.00    Types: Cigarettes  . Smokeless tobacco: Never Used  . Alcohol use 0.0 oz/week     Comment: occasional    Medications: I have reviewed the patient's current medications. Allergies as of 12/26/2016      Reactions   Adhesive [tape] Other (See Comments)   Skin irritation, patient prefers paper tape.    Celexa [citalopram Hydrobromide] Other (See  Comments)   Make patient too carefree      Medication List       Accurate as of 12/26/16 10:01 AM. Always use your most recent med list.          albuterol 108 (90 Base) MCG/ACT inhaler Commonly known as:  PROVENTIL HFA;VENTOLIN HFA Inhale 1-2 puffs into the lungs every 4 (four) hours as needed for wheezing or shortness of breath.   ALPRAZolam 0.5 MG tablet Commonly known as:  XANAX TAKE 1 TABLET BY MOUTH AT BEDTIME AS NEEDED FOR  SLEEP   atorvastatin 20 MG tablet Commonly known as:  LIPITOR Take 1 tablet (20 mg total) by mouth daily.   benzonatate 100 MG capsule Commonly known as:  TESSALON One po QHS prn cough   cyclobenzaprine 10 MG tablet Commonly known as:  FLEXERIL Take 1 tablet (10 mg total) by mouth 3 (three) times daily as needed for muscle spasms.   diclofenac 75 MG EC tablet Commonly known as:  VOLTAREN Take 1 tablet (75 mg total) by mouth 2 (two) times daily.   famotidine 20 MG tablet Commonly known as:  PEPCID Take 1 tablet (20 mg total) by mouth 2 (two) times daily.   gabapentin 600 MG tablet Commonly known as:  NEURONTIN Take 1  tablet (600 mg total) by mouth 3 (three) times daily.   HYDROcodone-acetaminophen 5-325 MG tablet Commonly known as:  NORCO/VICODIN 1 or 2 tabs PO q6 hours prn pain   levothyroxine 150 MCG tablet Commonly known as:  SYNTHROID, LEVOTHROID TAKE ONE TABLET BY MOUTH ONCE DAILY BEFORE BREAKFAST   levothyroxine 150 MCG tablet Commonly known as:  SYNTHROID, LEVOTHROID TAKE ONE TABLET BY MOUTH ONCE DAILY BEFORE  BREAKFAST   lidocaine 2 % jelly Commonly known as:  XYLOCAINE Apply 1 application topically as needed.   lisinopril 10 MG tablet Commonly known as:  PRINIVIL,ZESTRIL Take 1 tablet (10 mg total) by mouth daily.   ondansetron 4 MG disintegrating tablet Commonly known as:  ZOFRAN ODT Take 1 tablet (4 mg total) by mouth every 8 (eight) hours as needed for nausea or vomiting.   traMADol 50 MG tablet Commonly known  as:  ULTRAM Take 1 tablet (50 mg total) by mouth every 8 (eight) hours as needed.        ROS:  A comprehensive review of systems was negative except for: Gastrointestinal: positive for abdominal pain, constipation and nausea Musculoskeletal: positive for back pain  Blood pressure (!) 167/105, pulse 86, temperature (!) 97.5 F (36.4 C), resp. rate 18, height 5' 7" (1.702 m), weight 236 lb (107 kg), last menstrual period 02/25/2016. Physical Exam  Constitutional: Teresa Vincent is oriented to person, place, and time and well-developed, well-nourished, and in no distress.  HENT:  Head: Normocephalic.  Eyes: Pupils are equal, round, and reactive to light. No scleral icterus.  Cardiovascular: Normal rate and regular rhythm.   Pulmonary/Chest: Effort normal and breath sounds normal.  Abdominal: Soft. Bowel sounds are normal. Teresa Vincent exhibits no distension. There is tenderness. There is no rebound and no guarding.  Musculoskeletal: Normal range of motion. Teresa Vincent exhibits no edema.  Neurological: Teresa Vincent is alert and oriented to person, place, and time.  Skin: Skin is warm and dry.  Psychiatric: Mood, memory, affect and judgment normal.    Results: Personally reviewed imaging  US 10/2- cholelithiasis, no cholecystitis, CBD 3.3  CT a/p- 9/24- cholelithiasis, constipation, no fluid or free air   Assessment & Plan:  Teresa Vincent is a 36 y.o. female with biliary colic who continues to have pain. Teresa Vincent has been to the ED and to her PCP multiple times. At this time the pain is constant.  Teresa Vincent is also having nausea.  Teresa Vincent had elevated BP today but past visits this is controlled. Teresa Vincent is taking her lisinopril.  -OR for laparoscopic cholecystectomy possible open 10/12  -Preop visit prior to OR, take meds per anesthesia recommendations prior to the OR -Continue lisinopril and be sure to take for BP control leading up to surgery   PLAN: I counseled the patient about the indication, risks and benefits of laparoscopic  cholecystectomy.  Teresa Vincent understands there is a very small chance for bleeding, infection, injury to normal structures (including common bile duct), conversion to open surgery, persistent symptoms, evolution of postcholecystectomy diarrhea, need for secondary interventions, anesthesia reaction, cardiopulmonary issues and other risks not specifically detailed here. I described the expected recovery, the plan for follow-up and the restrictions during the recovery phase.  All questions were answered.   Leeloo Silverthorne C Carisha Kantor 12/26/2016, 10:01 AM        

## 2016-12-26 NOTE — H&P (Signed)
[facility] History and Physical  Reason for Referral: Cholelithiasis  Referring Physician: Dr. Gerda Diss  Chief Complaint    Abdominal Pain      Teresa Vincent is a 36 y.o. female.  HPI: Teresa Vincent is a very pleasant 36 yo with reported 3 week history of RUQ and epigastric pain that has been constant but made worse with food.  The pain started with epigastric pain that would move to her RUQ after eating any type of food, and now has started to be constant in the RUQ.  It is associated with nausea but no vomiting.  The pain is not improved with anything at this time, and she did have to get some pain medication which helped somewhat initially but is now not improving the pain.  The pain is made worse with laying on that side and eating and she has a chronic discomfort in the area.    Past Medical History:  Diagnosis Date  . Abnormal Pap smear of cervix   . ANXIETY 08/30/2007  . Chronic back pain   . GRAVES' DISEASE 12/07/2008  . 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 INSOMNIA 08/30/2007  . Kidney stone   . MIGRAINE HEADACHE 08/30/2007  . Neck pain   . Sciatica   . Stroke [facility])     Past Surgical History:  Procedure Laterality Date  . CARPAL TUNNEL RELEASE    . LUMBAR EPIDURAL INJECTION    . TOTAL ABDOMINAL HYSTERECTOMY W/ BILATERAL SALPINGOOPHORECTOMY N/A   . TUBAL LIGATION  2003    Family History  Problem Relation Age of Onset  . Hypertension Mother   . Thyroid disease Neg Hx     Social History  Substance Use Topics  . Smoking status: Current Every Day Smoker    Packs/day: 1.00    Years: 26.00    Types: Cigarettes  . Smokeless tobacco: Never Used  . Alcohol use 0.0 oz/week     Comment: occasional    Medications: I have reviewed the patient's current medications. Allergies as of 12/26/2016      Reactions   Adhesive [tape] Other (See Comments)   Skin irritation, patient prefers paper tape.    Celexa [citalopram Hydrobromide] Other (See  Comments)   Make patient too carefree      Medication List       Accurate as of 12/26/16 10:01 AM. Always use your most recent med list.          albuterol 108 (90 Base) MCG/ACT inhaler Commonly known as:  PROVENTIL HFA;VENTOLIN HFA Inhale 1-2 puffs into the lungs every 4 (four) hours as needed for wheezing or shortness of breath.   ALPRAZolam 0.5 MG tablet Commonly known as:  XANAX TAKE 1 TABLET BY MOUTH AT BEDTIME AS NEEDED FOR  SLEEP   atorvastatin 20 MG tablet Commonly known as:  LIPITOR Take 1 tablet (20 mg total) by mouth daily.   benzonatate 100 MG capsule Commonly known as:  TESSALON One po QHS prn cough   cyclobenzaprine 10 MG tablet Commonly known as:  FLEXERIL Take 1 tablet (10 mg total) by mouth 3 (three) times daily as needed for muscle spasms.   diclofenac 75 MG EC tablet Commonly known as:  VOLTAREN Take 1 tablet (75 mg total) by mouth 2 (two) times daily.   famotidine 20 MG tablet Commonly known as:  PEPCID Take 1 tablet (20 mg total) by mouth 2 (two) times daily.   gabapentin 600 MG tablet Commonly known as:  NEURONTIN Take 1  tablet (600 mg total) by mouth 3 (three) times daily.   HYDROcodone-acetaminophen 5-325 MG tablet Commonly known as:  NORCO/VICODIN 1 or 2 tabs PO q6 hours prn pain   levothyroxine 150 MCG tablet Commonly known as:  SYNTHROID, LEVOTHROID TAKE ONE TABLET BY MOUTH ONCE DAILY BEFORE BREAKFAST   levothyroxine 150 MCG tablet Commonly known as:  SYNTHROID, LEVOTHROID TAKE ONE TABLET BY MOUTH ONCE DAILY BEFORE  BREAKFAST   lidocaine 2 % jelly Commonly known as:  XYLOCAINE Apply 1 application topically as needed.   lisinopril 10 MG tablet Commonly known as:  PRINIVIL,ZESTRIL Take 1 tablet (10 mg total) by mouth daily.   ondansetron 4 MG disintegrating tablet Commonly known as:  ZOFRAN ODT Take 1 tablet (4 mg total) by mouth every 8 (eight) hours as needed for nausea or vomiting.   traMADol 50 MG tablet Commonly known  as:  ULTRAM Take 1 tablet (50 mg total) by mouth every 8 (eight) hours as needed.        ROS:  A comprehensive review of systems was negative except for: Gastrointestinal: positive for abdominal pain, constipation and nausea Musculoskeletal: positive for back pain  Blood pressure (!) 167/105, pulse 86, temperature (!) 97.5 F (36.4 C), resp. rate 18, height  (1.702 m), weight 236 lb (107 kg), last menstrual period 02/25/2016. Physical Exam  Constitutional: She is oriented to person, place, and time and well-developed, well-nourished, and in no distress.  HENT:  Head: Normocephalic.  Eyes: Pupils are equal, round, and reactive to light. No scleral icterus.  Cardiovascular: Normal rate and regular rhythm.   Pulmonary/Chest: Effort normal and breath sounds normal.  Abdominal: Soft. Bowel sounds are normal. She exhibits no distension. There is tenderness. There is no rebound and no guarding.  Musculoskeletal: Normal range of motion. She exhibits no edema.  Neurological: She is alert and oriented to person, place, and time.  Skin: Skin is warm and dry.  Psychiatric: Mood, memory, affect and judgment normal.    Results: Personally reviewed imaging  [place] 10/2- cholelithiasis, no cholecystitis, CBD 3.3  CT a/p- 9/24- cholelithiasis, constipation, no fluid or free air   Assessment & Plan:  Teresa Vincent is a 36 y.o. female with biliary colic who continues to have pain. She has been to the ED and to her PCP multiple times. At this time the pain is constant.  She is also having nausea.  She had elevated BP today but past visits this is controlled. She is taking her lisinopril.  -OR for laparoscopic cholecystectomy possible open 10/12  -Preop visit prior to OR, take meds per anesthesia recommendations prior to the OR -Continue lisinopril and be sure to take for BP control leading up to surgery   PLAN: I counseled the patient about the indication, risks and benefits of laparoscopic  cholecystectomy.  She understands there is a very small chance for bleeding, infection, injury to normal structures (including common bile duct), conversion to open surgery, persistent symptoms, evolution of postcholecystectomy diarrhea, need for secondary interventions, anesthesia reaction, cardiopulmonary issues and other risks not specifically detailed here. I described the expected recovery, the plan for follow-up and the restrictions during the recovery phase.  All questions were answered.   [name] 12/26/2016, 10:01 AM

## 2016-12-26 NOTE — Patient Instructions (Signed)
Cholelithiasis Cholelithiasis is also called "gallstones." It is a kind of gallbladder disease. The gallbladder is an organ that stores a liquid (bile) that helps you digest fat. Gallstones may not cause symptoms (may be silent gallstones) until they cause a blockage, and then they can cause pain (gallbladder attack). Follow these instructions at home:  Take over-the-counter and prescription medicines only as told by your doctor.  Stay at a healthy weight.  Eat healthy foods. This includes: ? Eating fewer fatty foods, like fried foods. ? Eating fewer refined carbs (refined carbohydrates). Refined carbs are breads and grains that are highly processed, like white bread and white rice. Instead, choose whole grains like whole-wheat bread and brown rice. ? Eating more fiber. Almonds, fresh fruit, and beans are healthy sources of fiber.  Keep all follow-up visits as told by your doctor. This is important. Contact a doctor if:  You have sudden pain in the upper right side of your belly (abdomen). Pain might spread to your right shoulder or your chest. This may be a sign of a gallbladder attack.  You feel sick to your stomach (are nauseous).  You throw up (vomit).  You have been diagnosed with gallstones that have no symptoms and you get: ? Belly pain. ? Discomfort, burning, or fullness in the upper part of your belly (indigestion). Get help right away if:  You have sudden pain in the upper right side of your belly, and it lasts for more than 2 hours.  You have belly pain that lasts for more than 5 hours.  You have a fever or chills.  You keep feeling sick to your stomach or you keep throwing up.  Your skin or the whites of your eyes turn yellow (jaundice).  You have dark-colored pee (urine).  You have light-colored poop (stool). Summary  Cholelithiasis is also called "gallstones."  The gallbladder is an organ that stores a liquid (bile) that helps you digest fat.  Silent  gallstones are gallstones that do not cause symptoms.  A gallbladder attack may cause sudden pain in the upper right side of your belly. Pain might spread to your right shoulder or your chest. If this happens, contact your doctor.  If you have sudden pain in the upper right side of your belly that lasts for more than 2 hours, get help right away. This information is not intended to replace advice given to you by your health care provider. Make sure you discuss any questions you have with your health care provider. Document Released: 08/23/2007 Document Revised: 11/21/2015 Document Reviewed: 11/21/2015 Elsevier Interactive Patient Education  2017 Elsevier Inc[employer]   Laparoscopic Cholecystectomy Laparoscopic cholecystectomy is surgery to remove the gallbladder. The gallbladder is a pear-shaped organ that lies beneath the liver on the right side of the body. The gallbladder stores bile, which is a fluid that helps the body to digest fats. Cholecystectomy is often done for inflammation of the gallbladder (cholecystitis). This condition is usually caused by a buildup of gallstones (cholelithiasis) in the gallbladder. Gallstones can block the flow of bile, which can result in inflammation and pain. In severe cases, emergency surgery may be required. This procedure is done though small incisions in your abdomen (laparoscopic surgery). A thin scope with a camera (laparoscope) is inserted through one incision. Thin surgical instruments are inserted through the other incisions. In some cases, a laparoscopic procedure may be turned into a type of surgery that is done through a larger incision (open surgery). Tell a health care provider about:    Any allergies you have.  All medicines you are taking, including vitamins, herbs, eye drops, creams, and over-the-counter medicines.  Any problems you or family members have had with anesthetic medicines.  Any blood disorders you have.  Any surgeries you have  had.  Any medical conditions you have.  Whether you are pregnant or may be pregnant. What are the risks? Generally, this is a safe procedure. However, problems may occur, including:  Infection.  Bleeding.  Allergic reactions to medicines.  Damage to other structures or organs.  A stone remaining in the common bile duct. The common bile duct carries bile from the gallbladder into the small intestine.  A bile leak from the cyst duct that is clipped when your gallbladder is removed.  What happens before the procedure? Staying hydrated Follow instructions from your health care provider about hydration, which may include:  Up to 2 hours before the procedure - you may continue to drink clear liquids, such as water, clear fruit juice, black coffee, and plain tea.  Eating and drinking restrictions Follow instructions from your health care provider about eating and drinking, which may include:  8 hours before the procedure - stop eating heavy meals or foods such as meat, fried foods, or fatty foods.  6 hours before the procedure - stop eating light meals or foods, such as toast or cereal.  6 hours before the procedure - stop drinking milk or drinks that contain milk.  2 hours before the procedure - stop drinking clear liquids.  Medicines  Ask your health care provider about: ? Changing or stopping your regular medicines. This is especially important if you are taking diabetes medicines or blood thinners. ? Taking medicines such as aspirin and ibuprofen. These medicines can thin your blood. Do not take these medicines before your procedure if your health care provider instructs you not to.  You may be given antibiotic medicine to help prevent infection. General instructions  Let your health care provider know if you develop a cold or an infection before surgery.  Plan to have someone take you home from the hospital or clinic.  Ask your health care provider how your surgical  site will be marked or identified. What happens during the procedure?  To reduce your risk of infection: ? Your health care team will wash or sanitize their hands. ? Your skin will be washed with soap. ? Hair may be removed from the surgical area.  An IV tube may be inserted into one of your veins.  You will be given one or more of the following: ? A medicine to help you relax (sedative). ? A medicine to make you fall asleep (general anesthetic).  A breathing tube will be placed in your mouth.  Your surgeon will make several small cuts (incisions) in your abdomen.  The laparoscope will be inserted through one of the small incisions. The camera on the laparoscope will send images to a TV screen (monitor) in the operating room. This lets your surgeon see inside your abdomen.  Air-like gas will be pumped into your abdomen. This will expand your abdomen to give the surgeon more room to perform the surgery.  Other tools that are needed for the procedure will be inserted through the other incisions. The gallbladder will be removed through one of the incisions.  Your common bile duct may be examined. If stones are found in the common bile duct, they may be removed.  After your gallbladder has been removed, the incisions will be   closed with stitches (sutures), staples, or skin glue.  Your incisions may be covered with a bandage (dressing). The procedure may vary among health care providers and hospitals. What happens after the procedure?  Your blood pressure, heart rate, breathing rate, and blood oxygen level will be monitored until the medicines you were given have worn off.  You will be given medicines as needed to control your pain.  Do not drive for 24 hours if you were given a sedative. This information is not intended to replace advice given to you by your health care provider. Make sure you discuss any questions you have with your health care provider. Document Released:  03/06/2005 Document Revised: 09/26/2015 Document Reviewed: 08/23/2015 Elsevier Interactive Patient Education  2018 Elsevier Inc.   

## 2016-12-27 ENCOUNTER — Encounter (HOSPITAL_COMMUNITY)
Admission: RE | Admit: 2016-12-27 | Discharge: 2016-12-27 | Disposition: A | Discharge status: Home / Self Care | Payer: Medicaid Other | Source: Ambulatory Visit | Attending: General Surgery | Admitting: General Surgery

## 2016-12-28 ENCOUNTER — Ambulatory Visit: Payer: Medicaid Other | Admitting: Gastroenterology

## 2016-12-29 ENCOUNTER — Encounter (HOSPITAL_COMMUNITY): Payer: Self-pay | Admitting: *Deleted

## 2016-12-29 ENCOUNTER — Ambulatory Visit (HOSPITAL_COMMUNITY): Payer: Medicaid Other | Admitting: Anesthesiology

## 2016-12-29 ENCOUNTER — Ambulatory Visit (HOSPITAL_COMMUNITY)
Admission: RE | Admit: 2016-12-29 | Discharge: 2016-12-29 | Disposition: A | Discharge status: Home / Self Care | Payer: Medicaid Other | Source: Ambulatory Visit | Attending: General Surgery | Admitting: General Surgery

## 2016-12-29 ENCOUNTER — Encounter (HOSPITAL_COMMUNITY)
Admission: RE | Disposition: A | Discharge status: Home / Self Care | Payer: Self-pay | Source: Ambulatory Visit | Attending: General Surgery

## 2016-12-29 DIAGNOSIS — Z87442 Personal history of urinary calculi: Secondary | ICD-10-CM | POA: Diagnosis not present

## 2016-12-29 DIAGNOSIS — Z8673 Personal history of transient ischemic attack (TIA), and cerebral infarction without residual deficits: Secondary | ICD-10-CM | POA: Diagnosis not present

## 2016-12-29 DIAGNOSIS — F419 Anxiety disorder, unspecified: Secondary | ICD-10-CM | POA: Diagnosis not present

## 2016-12-29 DIAGNOSIS — M549 Dorsalgia, unspecified: Secondary | ICD-10-CM | POA: Insufficient documentation

## 2016-12-29 DIAGNOSIS — Z8249 Family history of ischemic heart disease and other diseases of the circulatory system: Secondary | ICD-10-CM | POA: Diagnosis not present

## 2016-12-29 DIAGNOSIS — Z91048 Other nonmedicinal substance allergy status: Secondary | ICD-10-CM | POA: Diagnosis not present

## 2016-12-29 DIAGNOSIS — G4700 Insomnia, unspecified: Secondary | ICD-10-CM | POA: Insufficient documentation

## 2016-12-29 DIAGNOSIS — F17210 Nicotine dependence, cigarettes, uncomplicated: Secondary | ICD-10-CM | POA: Diagnosis not present

## 2016-12-29 DIAGNOSIS — E0500 Thyrotoxicosis with diffuse goiter without thyrotoxic crisis or storm: Secondary | ICD-10-CM | POA: Diagnosis not present

## 2016-12-29 DIAGNOSIS — K8010 Calculus of gallbladder with chronic cholecystitis without obstruction: Secondary | ICD-10-CM | POA: Diagnosis not present

## 2016-12-29 DIAGNOSIS — G8929 Other chronic pain: Secondary | ICD-10-CM | POA: Diagnosis not present

## 2016-12-29 DIAGNOSIS — G43909 Migraine, unspecified, not intractable, without status migrainosus: Secondary | ICD-10-CM | POA: Insufficient documentation

## 2016-12-29 DIAGNOSIS — K8020 Calculus of gallbladder without cholecystitis without obstruction: Secondary | ICD-10-CM | POA: Diagnosis not present

## 2016-12-29 DIAGNOSIS — Z888 Allergy status to other drugs, medicaments and biological substances status: Secondary | ICD-10-CM | POA: Diagnosis not present

## 2016-12-29 DIAGNOSIS — Z90710 Acquired absence of both cervix and uterus: Secondary | ICD-10-CM | POA: Insufficient documentation

## 2016-12-29 DIAGNOSIS — I10 Essential (primary) hypertension: Secondary | ICD-10-CM | POA: Diagnosis not present

## 2016-12-29 DIAGNOSIS — M542 Cervicalgia: Secondary | ICD-10-CM | POA: Diagnosis not present

## 2016-12-29 DIAGNOSIS — R05 Cough: Secondary | ICD-10-CM | POA: Insufficient documentation

## 2016-12-29 DIAGNOSIS — E890 Postprocedural hypothyroidism: Secondary | ICD-10-CM | POA: Diagnosis not present

## 2016-12-29 DIAGNOSIS — K219 Gastro-esophageal reflux disease without esophagitis: Secondary | ICD-10-CM | POA: Diagnosis not present

## 2016-12-29 HISTORY — PX: CHOLECYSTECTOMY: SHX55

## 2016-12-29 SURGERY — LAPAROSCOPIC CHOLECYSTECTOMY
Anesthesia: General | Site: Abdomen

## 2016-12-29 MED ORDER — PROMETHAZINE HCL 25 MG/ML IJ SOLN
12.5000 mg | Freq: Four times a day (QID) | INTRAMUSCULAR | Status: DC | PRN
  Administered 2016-12-29: 12.5 mg via INTRAVENOUS
  Filled 2016-12-29: qty 1

## 2016-12-29 MED ORDER — HEMOSTATIC AGENTS (NO CHARGE) OPTIME
TOPICAL | Status: DC | PRN
  Administered 2016-12-29: 1 via TOPICAL

## 2016-12-29 MED ORDER — ROCURONIUM BROMIDE 10 MG/ML (PF) SYRINGE
PREFILLED_SYRINGE | INTRAVENOUS | Status: DC | PRN
Start: 2016-12-29 — End: 2016-12-29
  Administered 2016-12-29: 10 mg via INTRAVENOUS
  Administered 2016-12-29: 30 mg via INTRAVENOUS

## 2016-12-29 MED ORDER — PROMETHAZINE HCL 25 MG/ML IJ SOLN
INTRAMUSCULAR | Status: AC
  Filled 2016-12-29: qty 1

## 2016-12-29 MED ORDER — OXYCODONE HCL 5 MG PO TABS: 5.0000 mg | ORAL_TABLET | ORAL | 0 refills | Status: DC | PRN

## 2016-12-29 MED ORDER — NEOSTIGMINE METHYLSULFATE 10 MG/10ML IV SOLN
INTRAVENOUS | Status: AC
  Filled 2016-12-29: qty 1

## 2016-12-29 MED ORDER — SUCCINYLCHOLINE CHLORIDE 200 MG/10ML IV SOSY
PREFILLED_SYRINGE | INTRAVENOUS | Status: DC | PRN
  Administered 2016-12-29: 120 mg via INTRAVENOUS

## 2016-12-29 MED ORDER — MIDAZOLAM HCL 5 MG/5ML IJ SOLN
INTRAMUSCULAR | Status: DC | PRN
  Administered 2016-12-29: 2 mg via INTRAVENOUS

## 2016-12-29 MED ORDER — ROCURONIUM BROMIDE 50 MG/5ML IV SOLN
INTRAVENOUS | Status: AC
  Filled 2016-12-29: qty 1

## 2016-12-29 MED ORDER — ONDANSETRON HCL 4 MG/2ML IJ SOLN
INTRAMUSCULAR | Status: AC
  Filled 2016-12-29: qty 2

## 2016-12-29 MED ORDER — MIDAZOLAM HCL 2 MG/2ML IJ SOLN
INTRAMUSCULAR | Status: AC
  Filled 2016-12-29: qty 2

## 2016-12-29 MED ORDER — GLYCOPYRROLATE 0.2 MG/ML IJ SOLN
INTRAMUSCULAR | Status: AC
  Filled 2016-12-29: qty 3

## 2016-12-29 MED ORDER — NEOSTIGMINE METHYLSULFATE 5 MG/5ML IV SOSY
PREFILLED_SYRINGE | INTRAVENOUS | Status: DC | PRN
  Administered 2016-12-29: 4 mg via INTRAVENOUS

## 2016-12-29 MED ORDER — FENTANYL CITRATE (PF) 100 MCG/2ML IJ SOLN
INTRAMUSCULAR | Status: DC | PRN
  Administered 2016-12-29 (×2): 100 ug via INTRAVENOUS
  Administered 2016-12-29 (×3): 50 ug via INTRAVENOUS

## 2016-12-29 MED ORDER — DEXAMETHASONE SODIUM PHOSPHATE 4 MG/ML IJ SOLN
INTRAMUSCULAR | Status: AC
  Filled 2016-12-29: qty 1

## 2016-12-29 MED ORDER — BUPIVACAINE HCL (PF) 0.5 % IJ SOLN
INTRAMUSCULAR | Status: DC | PRN
  Administered 2016-12-29: 10 mL

## 2016-12-29 MED ORDER — HYDROMORPHONE HCL 1 MG/ML IJ SOLN
0.5000 mg | INTRAMUSCULAR | Status: AC
  Administered 2016-12-29: 0.5 mg via INTRAVENOUS

## 2016-12-29 MED ORDER — PROPOFOL 10 MG/ML IV BOLUS
INTRAVENOUS | Status: AC
  Filled 2016-12-29: qty 20

## 2016-12-29 MED ORDER — SODIUM CHLORIDE 0.9% FLUSH
INTRAVENOUS | Status: AC
  Filled 2016-12-29: qty 10

## 2016-12-29 MED ORDER — SUCCINYLCHOLINE CHLORIDE 20 MG/ML IJ SOLN
INTRAMUSCULAR | Status: AC
  Filled 2016-12-29: qty 1

## 2016-12-29 MED ORDER — SCOPOLAMINE 1 MG/3DAYS TD PT72
MEDICATED_PATCH | TRANSDERMAL | Status: AC
  Filled 2016-12-29: qty 1

## 2016-12-29 MED ORDER — FENTANYL CITRATE (PF) 100 MCG/2ML IJ SOLN
INTRAMUSCULAR | Status: AC
  Filled 2016-12-29: qty 2

## 2016-12-29 MED ORDER — HYDROMORPHONE HCL 1 MG/ML IJ SOLN
INTRAMUSCULAR | Status: AC
  Filled 2016-12-29: qty 1

## 2016-12-29 MED ORDER — ALBUTEROL SULFATE HFA 108 (90 BASE) MCG/ACT IN AERS
INHALATION_SPRAY | RESPIRATORY_TRACT | Status: DC | PRN
  Administered 2016-12-29: 2 via RESPIRATORY_TRACT

## 2016-12-29 MED ORDER — HYDROMORPHONE HCL 1 MG/ML IJ SOLN
1.0000 mg | INTRAMUSCULAR | Status: DC | PRN
  Administered 2016-12-29: 1 mg via INTRAVENOUS

## 2016-12-29 MED ORDER — PROMETHAZINE HCL 25 MG/ML IJ SOLN
12.5000 mg | Freq: Four times a day (QID) | INTRAMUSCULAR | Status: AC | PRN
  Administered 2016-12-29: 12.5 mg via INTRAVENOUS

## 2016-12-29 MED ORDER — LACTATED RINGERS IV SOLN
INTRAVENOUS | Status: DC | PRN
  Administered 2016-12-29: 08:00:00 via INTRAVENOUS

## 2016-12-29 MED ORDER — GLYCOPYRROLATE 0.2 MG/ML IV SOSY
PREFILLED_SYRINGE | INTRAVENOUS | Status: DC | PRN
  Administered 2016-12-29: 0.6 mg via INTRAVENOUS

## 2016-12-29 MED ORDER — CHLORHEXIDINE GLUCONATE CLOTH 2 % EX PADS: 6.0000 | MEDICATED_PAD | Freq: Once | CUTANEOUS | Status: DC

## 2016-12-29 MED ORDER — ONDANSETRON HCL 4 MG/2ML IJ SOLN
INTRAMUSCULAR | Status: DC | PRN
  Administered 2016-12-29: 4 mg via INTRAVENOUS

## 2016-12-29 MED ORDER — BUPIVACAINE HCL (PF) 0.5 % IJ SOLN
INTRAMUSCULAR | Status: AC
  Filled 2016-12-29: qty 30

## 2016-12-29 MED ORDER — SCOPOLAMINE 1 MG/3DAYS TD PT72
MEDICATED_PATCH | TRANSDERMAL | Status: DC | PRN
  Administered 2016-12-29: 1 via TRANSDERMAL

## 2016-12-29 MED ORDER — DOCUSATE SODIUM 100 MG PO CAPS: 100.0000 mg | ORAL_CAPSULE | Freq: Two times a day (BID) | ORAL | 2 refills | Status: DC

## 2016-12-29 MED ORDER — CEFAZOLIN SODIUM-DEXTROSE 2-4 GM/100ML-% IV SOLN
2.0000 g | INTRAVENOUS | Status: AC
  Administered 2016-12-29: 2 g via INTRAVENOUS
  Filled 2016-12-29: qty 100

## 2016-12-29 MED ORDER — 0.9 % SODIUM CHLORIDE (POUR BTL) OPTIME
TOPICAL | Status: DC | PRN
  Administered 2016-12-29: 1000 mL

## 2016-12-29 MED ORDER — HYDROMORPHONE HCL 1 MG/ML IJ SOLN
0.2500 mg | INTRAMUSCULAR | Status: DC | PRN
  Administered 2016-12-29 (×6): 0.5 mg via INTRAVENOUS
  Filled 2016-12-29 (×4): qty 1

## 2016-12-29 MED ORDER — FENTANYL CITRATE (PF) 250 MCG/5ML IJ SOLN
INTRAMUSCULAR | Status: AC
  Filled 2016-12-29: qty 5

## 2016-12-29 MED ORDER — DEXAMETHASONE SODIUM PHOSPHATE 4 MG/ML IJ SOLN
INTRAMUSCULAR | Status: DC | PRN
  Administered 2016-12-29: 4 mg via INTRAVENOUS

## 2016-12-29 SURGICAL SUPPLY — 53 items
ADH SKN CLS APL DERMABOND .7 (GAUZE/BANDAGES/DRESSINGS) ×1
APPLIER CLIP ROT 10 11.4 M/L (STAPLE) ×3
APR CLP MED LRG 11.4X10 (STAPLE) ×1
BAG HAMPER (MISCELLANEOUS) ×3 IMPLANT
BAG RETRIEVAL 10 (BASKET) ×1
BAG RETRIEVAL 10MM (BASKET) ×1
BLADE SURG 15 STRL LF DISP TIS (BLADE) ×1 IMPLANT
BLADE SURG 15 STRL SS (BLADE) ×3
CHLORAPREP W/TINT 26ML (MISCELLANEOUS) ×3 IMPLANT
CLIP APPLIE ROT 10 11.4 M/L (STAPLE) ×1 IMPLANT
CLOTH BEACON ORANGE TIMEOUT ST (SAFETY) ×3 IMPLANT
COVER LIGHT HANDLE STERIS (MISCELLANEOUS) ×6 IMPLANT
DECANTER SPIKE VIAL GLASS SM (MISCELLANEOUS) ×4 IMPLANT
DERMABOND ADVANCED (GAUZE/BANDAGES/DRESSINGS) ×2
DERMABOND ADVANCED .7 DNX12 (GAUZE/BANDAGES/DRESSINGS) ×1 IMPLANT
DEVICE TROCAR PUNCTURE CLOSURE (ENDOMECHANICALS) ×3 IMPLANT
ELECT REM PT RETURN 9FT ADLT (ELECTROSURGICAL) ×3
ELECTRODE REM PT RTRN 9FT ADLT (ELECTROSURGICAL) ×1 IMPLANT
FILTER SMOKE EVAC LAPAROSHD (FILTER) ×3 IMPLANT
FORMALIN 10 PREFIL 120ML (MISCELLANEOUS) ×3 IMPLANT
GLOVE BIO SURGEON STRL SZ 6.5 (GLOVE) ×2 IMPLANT
GLOVE BIO SURGEON STRL SZ7 (GLOVE) ×2 IMPLANT
GLOVE BIO SURGEONS STRL SZ 6.5 (GLOVE) ×1
GLOVE BIOGEL PI IND STRL 6.5 (GLOVE) ×1 IMPLANT
GLOVE BIOGEL PI IND STRL 7.0 (GLOVE) ×1 IMPLANT
GLOVE BIOGEL PI INDICATOR 6.5 (GLOVE) ×2
GLOVE BIOGEL PI INDICATOR 7.0 (GLOVE) ×4
GLOVE ECLIPSE 6.5 STRL STRAW (GLOVE) ×2 IMPLANT
GOWN STRL REUS W/TWL LRG LVL3 (GOWN DISPOSABLE) ×9 IMPLANT
HEMOSTAT SNOW SURGICEL 2X4 (HEMOSTASIS) ×3 IMPLANT
INST SET LAPROSCOPIC AP (KITS) ×3 IMPLANT
IV NS IRRIG 3000ML ARTHROMATIC (IV SOLUTION) IMPLANT
KIT ROOM TURNOVER APOR (KITS) ×3 IMPLANT
MANIFOLD NEPTUNE II (INSTRUMENTS) ×3 IMPLANT
NDL INSUFFLATION 14GA 120MM (NEEDLE) ×1 IMPLANT
NEEDLE INSUFFLATION 14GA 120MM (NEEDLE) ×3 IMPLANT
NS IRRIG 1000ML POUR BTL (IV SOLUTION) ×3 IMPLANT
PACK LAP CHOLE LZT030E (CUSTOM PROCEDURE TRAY) ×3 IMPLANT
PAD ARMBOARD 7.5X6 YLW CONV (MISCELLANEOUS) ×3 IMPLANT
SET BASIN LINEN APH (SET/KITS/TRAYS/PACK) ×3 IMPLANT
SET TUBE IRRIG SUCTION NO TIP (IRRIGATION / IRRIGATOR) IMPLANT
SLEEVE ENDOPATH XCEL 5M (ENDOMECHANICALS) ×3 IMPLANT
SUT MNCRL AB 4-0 PS2 18 (SUTURE) ×3 IMPLANT
SUT VICRYL 0 UR6 27IN ABS (SUTURE) ×3 IMPLANT
SYS BAG RETRIEVAL 10MM (BASKET) ×1
SYSTEM BAG RETRIEVAL 10MM (BASKET) ×1 IMPLANT
TROCAR XCEL NON-BLD 11X100MML (ENDOMECHANICALS) ×3 IMPLANT
TROCAR XCEL NON-BLD 5MMX100MML (ENDOMECHANICALS) ×3 IMPLANT
TROCAR Z-THREAD SLEEVE 11X100 (TROCAR) ×3 IMPLANT
TUBE CONNECTING 12'X1/4 (SUCTIONS) ×1
TUBE CONNECTING 12X1/4 (SUCTIONS) ×1 IMPLANT
TUBING INSUFFLATION (TUBING) ×3 IMPLANT
WARMER LAPAROSCOPE (MISCELLANEOUS) ×3 IMPLANT

## 2016-12-29 NOTE — Discharge Instructions (Signed)
Discharge Instructions: °Shower per your regular routine. °Take tylenol and ibuprofen as needed for pain control, alternating every 4-6 hours.  °Take Roxicodone for breakthrough pain. °Take colace for constipation related to narcotic pain medication. °Do not pick at the dermabond glue on your incision sites.  ° °Laparoscopic Cholecystectomy, Care After °This sheet gives you information about how to care for yourself after your procedure. Your doctor may also give you more specific instructions. If you have problems or questions, contact your doctor. °Follow these instructions at home: °Care for cuts from surgery (incisions) ° °· Follow instructions from your doctor about how to take care of your cuts from surgery. Make sure you: °? Wash your hands with soap and water before you change your bandage (dressing). If you cannot use soap and water, use hand sanitizer. °? Change your bandage as told by your doctor. °? Leave stitches (sutures), skin glue, or skin tape (adhesive) strips in place. They may need to stay in place for 2 weeks or longer. If tape strips get loose and curl up, you may trim the loose edges. Do not remove tape strips completely unless your doctor says it is okay. °· Do not take baths, swim, or use a hot tub until your doctor says it is okay. Ask your doctor if you can take showers. You may only be allowed to take sponge baths for bathing. °· Check your surgical cut area every day for signs of infection. Check for: °? More redness, swelling, or pain. °? More fluid or blood. °? Warmth. °? Pus or a bad smell. °Activity °· Do not drive or use heavy machinery while taking prescription pain medicine. °· Do not lift anything that is heavier than 10 lb (4.5 kg) until your doctor says it is okay. °· Do not play contact sports until your doctor says it is okay. °· Do not drive for 24 hours if you were given a medicine to help you relax (sedative). °· Rest as needed. Do not return to work or school until your  doctor says it is okay. °General instructions °· Take over-the-counter and prescription medicines only as told by your doctor. °· To prevent or treat constipation while you are taking prescription pain medicine, your doctor may recommend that you: °? Drink enough fluid to keep your pee (urine) clear or pale yellow. °? Take over-the-counter or prescription medicines. °? Eat foods that are high in fiber, such as fresh fruits and vegetables, whole grains, and beans. °? Limit foods that are high in fat and processed sugars, such as fried and sweet foods. °Contact a doctor if: °· You develop a rash. °· You have more redness, swelling, or pain around your surgical cuts. °· You have more fluid or blood coming from your surgical cuts. °· Your surgical cuts feel warm to the touch. °· You have pus or a bad smell coming from your surgical cuts. °· You have a fever. °· One or more of your surgical cuts breaks open. °Get help right away if: °· You have trouble breathing. °· You have chest pain. °· You have pain that is getting worse in your shoulders. °· You faint or feel dizzy when you stand. °· You have very bad pain in your belly (abdomen). °· You are sick to your stomach (nauseous) for more than one day. °· You have throwing up (vomiting) that lasts for more than one day. °· You have leg pain. °This information is not intended to replace advice given to you by your   health care provider. Make sure you discuss any questions you have with your health care provider. °Document Released: 12/14/2007 Document Revised: 09/25/2015 Document Reviewed: 08/23/2015 °Elsevier Interactive Patient Education © 2017 Elsevier Inc. ° °

## 2016-12-29 NOTE — Interval H&P Note (Signed)
History and Physical Interval Note:  12/29/2016 7:27 AM  Teresa Vincent  has presented today for surgery, with the diagnosis of Cholelithiasis  The various methods of treatment have been discussed with the patient and family. After consideration of risks, benefits and other options for treatment, the patient has consented to  Procedure(s): LAPAROSCOPIC CHOLECYSTECTOMY (N/A) as a surgical intervention .  The patient's history has been reviewed, patient examined, no change in status, stable for surgery.  I have reviewed the patient's chart and labs.  Questions were answered to the patient's satisfaction.     [name]

## 2016-12-29 NOTE — Anesthesia Postprocedure Evaluation (Signed)
Anesthesia Post Note  Patient: Teresa Vincent Late Entry for 1048  Procedure(s) Performed: LAPAROSCOPIC CHOLECYSTECTOMY (N/A Abdomen)  Patient location during evaluation: PACU Anesthesia Type: General Level of consciousness: awake and alert, oriented and patient cooperative Pain management: pain level controlled Vital Signs Assessment: post-procedure vital signs reviewed and stable Respiratory status: spontaneous breathing and respiratory function stable Cardiovascular status: stable Postop Assessment: no apparent nausea or vomiting Anesthetic complications: no  Patient had nausea relieved with Phenergan;Above note saying "no apparent nausea or vomiting" entered in error   Last Vitals:  Vitals:   12/29/16 1030 12/29/16 1038  BP:  113/79  Pulse: 76 80  Resp: 12 16  Temp:  36.7 C  SpO2: 94% 97%    Last Pain:  Vitals:   12/29/16 1038  TempSrc: Oral  PainSc: 8                  Takeisha Cianci A

## 2016-12-29 NOTE — Addendum Note (Signed)
Addendum  created 12/29/16 1153 by Earleen Newport, CRNA   Charge Capture section accepted

## 2016-12-29 NOTE — Anesthesia Preprocedure Evaluation (Addendum)
Anesthesia Evaluation  Patient identified by MRN, date of birth, ID band Patient awake    Airway Mallampati: I  TM Distance: >3 FB Neck ROM: Full    Dental  (+) Teeth Intact   Pulmonary Current Smoker,  Recovering from cold 2-3 weeks ago; mild lingering cough; breath sounds clear; will give albuterol 3 puffs   breath sounds clear to auscultation       Cardiovascular hypertension, Pt. on medications      Neuro/Psych Anxiety    GI/Hepatic GERD  Medicated and Controlled,  Endo/Other  Hypothyroidism S/p radiation  Renal/GU      Musculoskeletal   Abdominal   Peds  Hematology   Anesthesia Other Findings   Reproductive/Obstetrics                             Anesthesia Physical Anesthesia Plan  ASA: II  Anesthesia Plan: General   Post-op Pain Management:    Induction: Intravenous, Rapid sequence and Cricoid pressure planned  PONV Risk Score and Plan: Ondansetron and Scopolamine patch - Pre-op  Airway Management Planned: Oral ETT  Additional Equipment:   Intra-op Plan:   Post-operative Plan: Extubation in OR  Informed Consent:   Dental advisory given  Plan Discussed with: CRNA and Anesthesiologist  Anesthesia Plan Comments:        Anesthesia Quick Evaluation

## 2016-12-29 NOTE — Op Note (Signed)
Operative Note   Preoperative Diagnosis: Symptomatic cholelithiasis   Postoperative Diagnosis: Same   Procedure(s) Performed: Laparoscopic cholecystectomy   Surgeon: Lillia Abed C. Henreitta Leber, MD   Assistants: No qualified resident was available   Anesthesia: General endotracheal   Anesthesiologist: Dr. Jayme Cloud    Specimens: Gallbladder    Estimated Blood Loss: Minimal    Blood Replacement: None    Complications: None    Operative Findings: Large, distended gallbladder with stones    Procedure: The patient was taken to the operating room and placed supine. General endotracheal anesthesia was induced. Intravenous antibiotics were administered per protocol. An orogastric tube positioned to decompress the stomach. The abdomen was prepared and draped in the usual sterile fashion.    A left upper quadrant stab incision was made and a Veress technique was utilized to achieve pneumoperitoneum to 15 mmHg with carbon dioxide. A 12 mm optiview port was placed through the supraumbilical region, and a 10 mm 0-degree operative laparoscope was introduced. The area underlying the trocar and Veress needle were inspected and without evidence of injury.  Remaining trocars were placed under direct vision. Two 5 mm ports were placed in the right abdomen, between the anterior axillary and midclavicular line.  A final 10 mm port was placed through the mid-epigastrium, near the falciform ligament.    The gallbladder fundus was elevated cephalad and the infundibulum was retracted to the patient's right. The gallbladder/cystic duct junction was skeletonized. The cystic artery noted in the triangle of Calot and was also skeletonized.  We then continued liberal medial and lateral dissection until the critical view of safety was achieved.    The cystic duct was triply clipped and the cystic artery was doubly clipped and both were divided using locking clips. The gallbladder was then dissected from the liver bed with  electrocautery. The specimen was placed in an Endopouch and was retrieved through the epigastric site. A piece of Surgical SNoW as placed in the liver bed.    Final inspection revealed acceptable hemostasis. A closure device was utilized to place 0 Vicryl fascial sutures at the epigastric port site. Trocars were removed under direct vision and pneumoperitoneum was released. The umbilical port site was small and deep, and unable to be closed. Skin incisions were closed with 4-0 Monocryl subcuticular sutures and Dermabond. The patient was awakened from anesthesia and extubated without complication.   All counts were correct at the end of the procedure.     Algis Greenhouse, MD [facility] [address] [place], [place] [address] [phone] (office)

## 2016-12-29 NOTE — Transfer of Care (Signed)
Immediate Anesthesia Transfer of Care Note  Patient: Teresa Vincent  Procedure(s) Performed: LAPAROSCOPIC CHOLECYSTECTOMY (N/A Abdomen)  Patient Location: PACU  Anesthesia Type:General  Level of Consciousness: awake, alert  and oriented  Airway & Oxygen Therapy: Patient Spontanous Breathing and Patient connected to nasal cannula oxygen  Post-op Assessment: Report given to RN and Post -op Vital signs reviewed and stable  Post vital signs: Reviewed and stable  Last Vitals:  Vitals:   12/29/16 0713 12/29/16 0745  BP:  (!) 144/88  Resp:  16  Temp: 36.7 C   SpO2:  95%    Last Pain:  Vitals:   12/29/16 0713  TempSrc: Oral  PainSc: 8       Patients Stated Pain Goal: 6 (12/29/16 0713)  Complications: No apparent anesthesia complications

## 2016-12-29 NOTE — Anesthesia Procedure Notes (Signed)
Procedure Name: Intubation Date/Time: 12/29/2016 8:01 AM Performed by: Jonna Munro Pre-anesthesia Checklist: Patient identified, Emergency Drugs available, Suction available, Patient being monitored and Timeout performed Patient Re-evaluated:Patient Re-evaluated prior to induction Oxygen Delivery Method: Circle system utilized Preoxygenation: Pre-oxygenation with 100% oxygen Induction Type: IV induction, Rapid sequence and Cricoid Pressure applied Laryngoscope Size: Mac and 3 Grade View: Grade I Tube type: Oral Number of attempts: 1 Airway Equipment and Method: Stylet Secured at: 22 cm Tube secured with: Tape Dental Injury: Teeth and Oropharynx as per pre-operative assessment

## 2016-12-30 ENCOUNTER — Observation Stay (HOSPITAL_COMMUNITY)
Admission: EM | Admit: 2016-12-30 | Discharge: 2016-12-31 | Disposition: A | Discharge status: Home / Self Care | Payer: Medicaid Other | Attending: General Surgery | Admitting: General Surgery

## 2016-12-30 ENCOUNTER — Encounter (HOSPITAL_COMMUNITY): Payer: Self-pay | Admitting: Emergency Medicine

## 2016-12-30 ENCOUNTER — Telehealth: Payer: Self-pay | Admitting: General Surgery

## 2016-12-30 ENCOUNTER — Emergency Department (HOSPITAL_COMMUNITY): Payer: Medicaid Other

## 2016-12-30 DIAGNOSIS — R1011 Right upper quadrant pain: Secondary | ICD-10-CM | POA: Diagnosis not present

## 2016-12-30 DIAGNOSIS — E039 Hypothyroidism, unspecified: Secondary | ICD-10-CM | POA: Diagnosis not present

## 2016-12-30 DIAGNOSIS — Z8673 Personal history of transient ischemic attack (TIA), and cerebral infarction without residual deficits: Secondary | ICD-10-CM | POA: Diagnosis not present

## 2016-12-30 DIAGNOSIS — D72829 Elevated white blood cell count, unspecified: Secondary | ICD-10-CM | POA: Insufficient documentation

## 2016-12-30 DIAGNOSIS — G8918 Other acute postprocedural pain: Secondary | ICD-10-CM | POA: Diagnosis not present

## 2016-12-30 DIAGNOSIS — F17210 Nicotine dependence, cigarettes, uncomplicated: Secondary | ICD-10-CM | POA: Diagnosis not present

## 2016-12-30 DIAGNOSIS — I10 Essential (primary) hypertension: Secondary | ICD-10-CM | POA: Diagnosis not present

## 2016-12-30 DIAGNOSIS — Z79899 Other long term (current) drug therapy: Secondary | ICD-10-CM | POA: Diagnosis not present

## 2016-12-30 LAB — CBC WITH DIFFERENTIAL/PLATELET
BASOS PCT: 0 %
Basophils Absolute: 0 10*3/uL (ref 0.0–0.1)
Eosinophils Absolute: 0.2 10*3/uL (ref 0.0–0.7)
Eosinophils Relative: 2 %
HEMATOCRIT: 41.2 % (ref 36.0–46.0)
HEMOGLOBIN: 13.8 g/dL (ref 12.0–15.0)
LYMPHS ABS: 3.7 10*3/uL (ref 0.7–4.0)
Lymphocytes Relative: 25 %
MCH: 31.3 pg (ref 26.0–34.0)
MCHC: 33.5 g/dL (ref 30.0–36.0)
MCV: 93.4 fL (ref 78.0–100.0)
MONOS PCT: 6 %
Monocytes Absolute: 0.8 10*3/uL (ref 0.1–1.0)
NEUTROS ABS: 9.8 10*3/uL — AB (ref 1.7–7.7)
Neutrophils Relative %: 67 %
Platelets: 226 10*3/uL (ref 150–400)
RBC: 4.41 MIL/uL (ref 3.87–5.11)
RDW: 13.5 % (ref 11.5–15.5)
WBC: 14.6 10*3/uL — AB (ref 4.0–10.5)

## 2016-12-30 LAB — COMPREHENSIVE METABOLIC PANEL
ALBUMIN: 4 g/dL (ref 3.5–5.0)
ALK PHOS: 57 U/L (ref 38–126)
ALT: 44 U/L (ref 14–54)
ANION GAP: 10 (ref 5–15)
AST: 45 U/L — ABNORMAL HIGH (ref 15–41)
BILIRUBIN TOTAL: 0.5 mg/dL (ref 0.3–1.2)
BUN: 10 mg/dL (ref 6–20)
CALCIUM: 9.1 mg/dL (ref 8.9–10.3)
CO2: 27 mmol/L (ref 22–32)
CREATININE: 0.95 mg/dL (ref 0.44–1.00)
Chloride: 99 mmol/L — ABNORMAL LOW (ref 101–111)
GFR calc Af Amer: >60 mL/min (ref >60)
GFR calc non Af Amer: >60 mL/min (ref >60)
GLUCOSE: 97 mg/dL (ref 65–99)
Potassium: 3.6 mmol/L (ref 3.5–5.1)
SODIUM: 136 mmol/L (ref 135–145)
TOTAL PROTEIN: 7.1 g/dL (ref 6.5–8.1)

## 2016-12-30 LAB — I-STAT CG4 LACTIC ACID, ED: Lactic Acid, Venous: 1.4 mmol/L (ref 0.5–1.9)

## 2016-12-30 MED ORDER — IBUPROFEN 600 MG PO TABS
600.0000 mg | ORAL_TABLET | Freq: Four times a day (QID) | ORAL | Status: DC
  Administered 2016-12-30 – 2016-12-31 (×5): 600 mg via ORAL
  Filled 2016-12-30: qty 1
  Filled 2016-12-30: qty 2
  Filled 2016-12-30 (×3): qty 1

## 2016-12-30 MED ORDER — ALBUTEROL SULFATE (2.5 MG/3ML) 0.083% IN NEBU
2.5000 mg | INHALATION_SOLUTION | RESPIRATORY_TRACT | Status: DC | PRN
  Administered 2016-12-31: 2.5 mg via RESPIRATORY_TRACT
  Filled 2016-12-30: qty 3

## 2016-12-30 MED ORDER — ACETAMINOPHEN 325 MG PO TABS
650.0000 mg | ORAL_TABLET | Freq: Four times a day (QID) | ORAL | Status: DC
  Administered 2016-12-30 – 2016-12-31 (×4): 650 mg via ORAL
  Filled 2016-12-30 (×6): qty 2

## 2016-12-30 MED ORDER — ALPRAZOLAM 0.5 MG PO TABS
0.5000 mg | ORAL_TABLET | Freq: Every evening | ORAL | Status: DC | PRN
  Administered 2016-12-30: 0.5 mg via ORAL
  Filled 2016-12-30: qty 1

## 2016-12-30 MED ORDER — HYDROMORPHONE HCL 1 MG/ML IJ SOLN
1.0000 mg | Freq: Once | INTRAMUSCULAR | Status: AC
  Administered 2016-12-30: 1 mg via INTRAVENOUS
  Filled 2016-12-30: qty 1

## 2016-12-30 MED ORDER — OXYCODONE HCL 5 MG PO TABS
5.0000 mg | ORAL_TABLET | Freq: Four times a day (QID) | ORAL | Status: DC | PRN
  Filled 2016-12-30 (×2): qty 1

## 2016-12-30 MED ORDER — CYCLOBENZAPRINE HCL 10 MG PO TABS: 10.0000 mg | ORAL_TABLET | Freq: Three times a day (TID) | ORAL | Status: DC | PRN

## 2016-12-30 MED ORDER — OXYCODONE HCL 5 MG PO TABS
5.0000 mg | ORAL_TABLET | ORAL | Status: DC
  Administered 2016-12-30 – 2016-12-31 (×7): 5 mg via ORAL
  Filled 2016-12-30 (×8): qty 1

## 2016-12-30 MED ORDER — HYDROMORPHONE HCL 1 MG/ML IJ SOLN
0.5000 mg | INTRAMUSCULAR | Status: DC | PRN
  Administered 2016-12-30 – 2016-12-31 (×3): 0.5 mg via INTRAVENOUS
  Filled 2016-12-30 (×3): qty 1

## 2016-12-30 MED ORDER — SIMETHICONE 80 MG PO CHEW: 40.0000 mg | CHEWABLE_TABLET | Freq: Four times a day (QID) | ORAL | Status: DC | PRN

## 2016-12-30 MED ORDER — LISINOPRIL 10 MG PO TABS
10.0000 mg | ORAL_TABLET | Freq: Every day | ORAL | Status: DC
  Administered 2016-12-31: 10 mg via ORAL
  Filled 2016-12-30: qty 1

## 2016-12-30 MED ORDER — ONDANSETRON 4 MG PO TBDP: 4.0000 mg | ORAL_TABLET | Freq: Three times a day (TID) | ORAL | Status: DC | PRN

## 2016-12-30 MED ORDER — ALBUTEROL SULFATE HFA 108 (90 BASE) MCG/ACT IN AERS: 1.0000 | INHALATION_SPRAY | RESPIRATORY_TRACT | Status: DC | PRN

## 2016-12-30 MED ORDER — GABAPENTIN 300 MG PO CAPS
600.0000 mg | ORAL_CAPSULE | Freq: Three times a day (TID) | ORAL | Status: DC
Start: 2016-12-30 — End: 2016-12-31
  Administered 2016-12-30 – 2016-12-31 (×4): 600 mg via ORAL
  Filled 2016-12-30 (×3): qty 2

## 2016-12-30 MED ORDER — NICOTINE 14 MG/24HR TD PT24
14.0000 mg | MEDICATED_PATCH | Freq: Every day | TRANSDERMAL | Status: DC
  Administered 2016-12-30 – 2016-12-31 (×2): 14 mg via TRANSDERMAL
  Filled 2016-12-30 (×2): qty 1

## 2016-12-30 MED ORDER — DIPHENHYDRAMINE HCL 50 MG/ML IJ SOLN
25.0000 mg | Freq: Four times a day (QID) | INTRAMUSCULAR | Status: DC | PRN
  Administered 2016-12-30 – 2016-12-31 (×2): 25 mg via INTRAVENOUS
  Filled 2016-12-30 (×2): qty 1

## 2016-12-30 MED ORDER — CYCLOBENZAPRINE HCL 10 MG PO TABS
10.0000 mg | ORAL_TABLET | Freq: Once | ORAL | Status: AC
  Administered 2016-12-30: 10 mg via ORAL
  Filled 2016-12-30: qty 1

## 2016-12-30 MED ORDER — ALPRAZOLAM 1 MG PO TABS
1.0000 mg | ORAL_TABLET | Freq: Once | ORAL | Status: AC
  Administered 2016-12-30: 1 mg via ORAL
  Filled 2016-12-30: qty 1

## 2016-12-30 MED ORDER — ENOXAPARIN SODIUM 40 MG/0.4ML ~~LOC~~ SOLN
40.0000 mg | SUBCUTANEOUS | Status: DC
  Administered 2016-12-30: 40 mg via SUBCUTANEOUS
  Filled 2016-12-30: qty 0.4

## 2016-12-30 MED ORDER — DOCUSATE SODIUM 100 MG PO CAPS
100.0000 mg | ORAL_CAPSULE | Freq: Two times a day (BID) | ORAL | Status: DC
  Administered 2016-12-30 – 2016-12-31 (×3): 100 mg via ORAL
  Filled 2016-12-30 (×3): qty 1

## 2016-12-30 MED ORDER — LEVOTHYROXINE SODIUM 75 MCG PO TABS
150.0000 ug | ORAL_TABLET | Freq: Every day | ORAL | Status: DC
  Administered 2016-12-31: 150 ug via ORAL
  Filled 2016-12-30: qty 2

## 2016-12-30 NOTE — ED Provider Notes (Signed)
[facility] Provider Note   CSN: [id number] Arrival date & time: 12/30/16  4782     History   Chief Complaint Chief Complaint  Patient presents with  . Post-op Problem    HPI Teresa Vincent is a 36 y.o. female.  HPI  The patient is a 36 year old female who underwent a cholecystectomy 24 hours ago and presents to the hospital today with pain in the epigastrium and right upper quadrant which seems to be persistent and severe, has not improved at all and is worse with coughing breathing and any movement. She denies nausea vomiting fevers chills and has not had a bowel movement since discharge. She denies any urinary symptoms. She feels like she cannot move at all, she cannot change position, she cannot strain, the pain is persistent. She has been taking oxycodone but denies that this helps at all. She called her general surgeon this morning who requested that she come to the hospital for evaluation   Past Medical History:  Diagnosis Date  . Abnormal Pap smear of cervix   . ANXIETY 08/30/2007  . Chronic back pain   . GRAVES' DISEASE 12/07/2008  . 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 INSOMNIA 08/30/2007  . Kidney stone   . MIGRAINE HEADACHE 08/30/2007  . Neck pain   . Sciatica   . Stroke [facility])     Patient Active Problem List   Diagnosis Date Noted  . Post-operative pain 12/30/2016  . Calculus of gallbladder without cholecystitis without obstruction   . Mixed hyperlipidemia 10/11/2016  . 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/10/2015  . Obesity due to excess calories 06/10/2015  . Left-sided low back pain with left-sided sciatica 05/03/2015  . Essential hypertension, benign 12/30/2012  . Esophageal reflux 12/30/2012  . 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 ANXIETY 08/30/2007  . MIGRAINE HEADACHE 08/30/2007  . Insomnia 08/30/2007    Past Surgical History:  Procedure Laterality Date  . CARPAL TUNNEL  RELEASE    . CHOLECYSTECTOMY    . LUMBAR EPIDURAL INJECTION    . TOTAL ABDOMINAL HYSTERECTOMY W/ BILATERAL SALPINGOOPHORECTOMY N/A   . TUBAL LIGATION  2003    OB History    Gravida Para Term Preterm AB Living   SAB TAB Ectopic Multiple Live Births                   Home Medications    Prior to Admission medications   Medication Sig Start Date End Date Taking? Authorizing Provider  albuterol (PROVENTIL HFA;VENTOLIN HFA) 108 (90 Base) MCG/ACT inhaler Inhale 1-2 puffs into the lungs every 4 (four) hours as needed for wheezing or shortness of breath. 12/13/16  Yes [name], MD  ALPRAZolam Prudy Feeler) 0.5 MG tablet TAKE 1 TABLET BY MOUTH AT BEDTIME AS NEEDED FOR  SLEEP Patient taking differently: TAKE 0.5 MG BY MOUTH AT BEDTIME AS NEEDED FOR SLEEP 11/21/16  Yes [name], MD  atorvastatin (LIPITOR) 20 MG tablet Take 1 tablet (20 mg total) by mouth daily. 10/11/16  Yes Roma Kayser, MD  Biotin 1 MG CAPS Take 1 mg by mouth daily.   Yes [provider]  cyclobenzaprine (FLEXERIL) 10 MG tablet Take 1 tablet (10 mg total) by mouth 3 (three) times daily as needed for muscle spasms. 12/13/16  Yes [name], MD  folic acid (FOLVITE) 400 MCG tablet Take 400 mcg by mouth daily.   Yes [provider]  gabapentin (NEURONTIN) 600 MG tablet Take 1 tablet (600 mg total) by mouth 3 (three) times daily. 09/07/16  Yes Campbell Riches, NP  GARCINIA CAMBOGIA-CHROMIUM PO Take 1 capsule by mouth daily.   Yes [provider]  levothyroxine (SYNTHROID, LEVOTHROID) 150 MCG tablet TAKE ONE TABLET BY MOUTH ONCE DAILY BEFORE BREAKFAST Patient taking differently: Take 150 mcg by mouth daily before breakfast.  11/23/16  Yes Nida, Denman George, MD  lisinopril (PRINIVIL,ZESTRIL) 10 MG tablet Take 1 tablet (10 mg total) by mouth daily. 05/05/16  Yes [name], MD  OVER THE COUNTER MEDICATION Take 500 mg by mouth daily. OTC Forskolin 500 mg   Yes  [provider]  oxyCODONE (ROXICODONE) 5 MG immediate release tablet Take 1 tablet (5 mg total) by mouth every 4 (four) hours as needed for moderate pain or severe pain. 12/29/16  Yes [name], MD  pyridOXINE (VITAMIN B-6) 100 MG tablet Take 100 mg by mouth daily.   Yes [provider]  traMADol (ULTRAM) 50 MG tablet Take 1 tablet (50 mg total) by mouth every 8 (eight) hours as needed. Patient taking differently: Take 50 mg by mouth every 8 (eight) hours as needed for moderate pain.  11/27/16  Yes Campbell Riches, NP  benzonatate (TESSALON) 100 MG capsule One po QHS prn cough Patient not taking: Reported on 12/26/2016 11/27/16   [name], MD  docusate sodium (COLACE) 100 MG capsule Take 1 capsule (100 mg total) by mouth 2 (two) times daily. Patient not taking: Reported on 12/30/2016 12/29/16 12/29/17  [name], MD  famotidine (PEPCID) 20 MG tablet Take 1 tablet (20 mg total) by mouth 2 (two) times daily. Patient not taking: Reported on 12/26/2016 12/11/16   Samuel Jester, DO  ondansetron (ZOFRAN ODT) 4 MG disintegrating tablet Take 1 tablet (4 mg total) by mouth every 8 (eight) hours as needed for nausea or vomiting. Patient not taking: Reported on 12/26/2016 12/11/16   Samuel Jester, DO    Family History Family History  Problem Relation Age of Onset  . Hypertension Mother   . Thyroid disease Neg Hx     Social History Social History  Substance Use Topics  . Smoking status: Current Every Day Smoker    Packs/day: 1.00    Years: 26.00    Types: Cigarettes  . Smokeless tobacco: Never Used  . Alcohol use 0.0 oz/week     Comment: occasional     Allergies   Adhesive [tape] and Celexa [citalopram hydrobromide]   Review of Systems Review of Systems  All other systems reviewed and are negative.    Physical Exam Updated Vital Signs BP 114/71   Pulse 68   Temp 98 F (36.7 C) (Oral)   Resp 18   Ht  (1.702 m)   Wt 107 kg  (236 lb)   LMP 02/25/2016 Comment: irregular periods  SpO2 100%   BMI 36.96 kg/m   Physical Exam  Constitutional: She appears well-developed and well-nourished. No distress.  HENT:  Head: Normocephalic and atraumatic.  Mouth/Throat: Oropharynx is clear and moist. No oropharyngeal exudate.  Eyes: Pupils are equal, round, and reactive to light. Conjunctivae and EOM are normal. Right eye exhibits no discharge. Left eye exhibits no discharge. No scleral icterus.  Neck: Normal range of motion. Neck supple. No JVD present. No thyromegaly present.  Cardiovascular: Normal rate, regular rhythm, normal heart sounds and intact distal pulses.  Exam reveals no gallop and no friction rub.  No murmur heard. Pulmonary/Chest: Effort normal and breath sounds normal. No respiratory distress. She has no wheezes. She has no rales.  Abdominal: Soft. Bowel sounds are normal. She exhibits no distension and no mass. There is tenderness.  Focal tenderness to palpation in the right upper quadrant and epigastrium, there is no lower abdominal tenderness. Surgical sites, covered in Dermabond, no surrounding redness  Musculoskeletal: Normal range of motion. She exhibits no edema or tenderness.  Lymphadenopathy:    She has no cervical adenopathy.  Neurological: She is alert. Coordination normal.  Skin: Skin is warm and dry. No rash noted. No erythema.  Very sensitive to touch of the skin. Hyperesthesias present  Psychiatric: She has a normal mood and affect. Her behavior is normal.  Nursing note and vitals reviewed.    ED Treatments / Results  Labs (all labs ordered are listed, but only abnormal results are displayed) Labs Reviewed  CBC WITH DIFFERENTIAL/PLATELET - Abnormal; Notable for the following:       Result Value   WBC 14.6 (*)    Neutro Abs 9.8 (*)    All other components within normal limits  COMPREHENSIVE METABOLIC PANEL - Abnormal; Notable for the following:    Chloride 99 (*)    AST 45 (*)     All other components within normal limits  I-STAT CG4 LACTIC ACID, ED  I-STAT CG4 LACTIC ACID, ED    Radiology No results found.  Procedures Procedures (including critical care time)  Medications Ordered in ED Medications  docusate sodium (COLACE) capsule 100 mg (not administered)  oxyCODONE (Oxy IR/ROXICODONE) immediate release tablet 5 mg (not administered)  cyclobenzaprine (FLEXERIL) tablet 10 mg (not administered)  ondansetron (ZOFRAN-ODT) disintegrating tablet 4 mg (not administered)  levothyroxine (SYNTHROID, LEVOTHROID) tablet 150 mcg (not administered)  ALPRAZolam (XANAX) tablet 0.5 mg (not administered)  gabapentin (NEURONTIN) capsule 600 mg (not administered)  lisinopril (PRINIVIL,ZESTRIL) tablet 10 mg (10 mg Oral Not Given 12/30/16 1122)  enoxaparin (LOVENOX) injection 40 mg (not administered)  acetaminophen (TYLENOL) tablet 650 mg (not administered)  ibuprofen (ADVIL,MOTRIN) tablet 600 mg (not administered)  HYDROmorphone (DILAUDID) injection 0.5 mg (not administered)  oxyCODONE (Oxy IR/ROXICODONE) immediate release tablet 5 mg (not administered)  simethicone (MYLICON) chewable tablet 40 mg (not administered)  albuterol (PROVENTIL) (2.5 MG/3ML) 0.083% nebulizer solution 2.5 mg (not administered)  HYDROmorphone (DILAUDID) injection 1 mg (1 mg Intravenous Given 12/30/16 0937)  HYDROmorphone (DILAUDID) injection 1 mg (1 mg Intravenous Given 12/30/16 1049)  cyclobenzaprine (FLEXERIL) tablet 10 mg (10 mg Oral Given 12/30/16 1048)     Initial Impression / Assessment and Plan / ED Course  I have reviewed the triage vital signs and the nursing notes.  Pertinent labs & imaging results that were available during my care of the patient were reviewed by me and considered in my medical decision making (see chart for details).    Discussed with Dr. Henreitta Leber at 9:15 AM, she will come to see the patient. Some Concern for postoperative complication. Patient has normal vital  signs   lactic acid is 1.4 , slight leukocytosis, vital signs remain stable, Dr. Henreitta Leber will admit the patient for serial adomial exams and labs   Final Clinical Impressions(s) / ED Diagnoses   Final diagnoses:  Post-operative pain  Right upper quadrant abdominal pain    New Prescriptions New Prescriptions   No medications on file     Eber Hong, MD 12/30/16 1159

## 2016-12-30 NOTE — ED Triage Notes (Signed)
Patient c/o severe right upper abd pain with swelling. Patient had cholecystectomy yesterday. Patient states pain does not improve with pain medication. Per patient taking Roxicodone , tylenol, and ibuprofen. Patient reports calling surgeon and was told to come in. Patient states she took 2 Roxicodone and 4 ibuprofen prior to coming to ER with no improvement.

## 2016-12-30 NOTE — Telephone Encounter (Signed)
[facility]  Patient called phone center. Having pain at the epigastric port site s/p Lap chole. Says it is swollen but the pain is about a silver dollar size around the port site.  She is having no fevers but reports shaking from the pain.  She has been taking tylenol an roxicodone and has not taken any ibuprofen since post op.   Currently she is oriented, alert, and having pain at that site.  She is home alone with her 29 yo daughter who has a learners permit.    I have discussed that this is likely from the suture in the muscle, some of it can be from the pneumoperitoneum and gas we place, and that potentially she had a controlled bleed that caused a hematoma under her skin that could be causing more pain.   At 630 I instructed her to take 2 roxicodone, and 800 mg ibuprofen to get her pain under control.    She is still having significant pain when I called her back at 740.   Her stepdaughter is now available to bring her to the hospital.  I will see her in the ED and evaluate.  Algis Greenhouse, MD [facility] [address] [place], [place] [address] [phone] (office)

## 2016-12-30 NOTE — Progress Notes (Signed)
Received verbal order for one time dose on 1 mg of xanax from Dr. Henreitta Leber

## 2016-12-30 NOTE — H&P (Addendum)
[facility] History and Physical   Chief Complaint    Post-op Problem      Teresa Vincent is a 36 y.o. female.  HPI: Teresa Vincent is a 36 yo POD 1 s/p Lap cholecystectomy who has epigastric pain at her port site.  She has not had nausea or  Vomiting or fevers but could not get her pain under control. She has chronic pain at baseline but this pain is acute and not relieved.  I talked to her the AM and advised her to take her Roxicodone and Ibuprofen, which did not help. She came to the ED for evaluation. Her Labs were reviewed and vitals were within normal limits.  Given the pain issues, I will bring her in for monitoring and pain control.   Past Medical History:  Diagnosis Date  . Abnormal Pap smear of cervix   . ANXIETY 08/30/2007  . Chronic back pain   . GRAVES' DISEASE 12/07/2008  . 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 INSOMNIA 08/30/2007  . Kidney stone   . MIGRAINE HEADACHE 08/30/2007  . Neck pain   . Sciatica   . Stroke [facility])     Past Surgical History:  Procedure Laterality Date  . CARPAL TUNNEL RELEASE    . CHOLECYSTECTOMY    . LUMBAR EPIDURAL INJECTION    . TOTAL ABDOMINAL HYSTERECTOMY W/ BILATERAL SALPINGOOPHORECTOMY N/A   . TUBAL LIGATION  2003    Family History  Problem Relation Age of Onset  . Hypertension Mother   . Thyroid disease Neg Hx     Social History  Substance Use Topics  . Smoking status: Current Every Day Smoker    Packs/day: 1.00    Years: 26.00    Types: Cigarettes  . Smokeless tobacco: Never Used  . Alcohol use 0.0 oz/week     Comment: occasional    Medications:  I have reviewed the patient's current medications. Prior to Admission:  (Not in a hospital admission) Scheduled: . acetaminophen  650 mg Oral Q6H  . docusate sodium  100 mg Oral BID  . enoxaparin (LOVENOX) injection  40 mg Subcutaneous Q24H  . gabapentin  300 mg Oral 6 X Daily  . ibuprofen  600 mg Oral Q6H  . [START ON 12/31/2016]  levothyroxine  150 mcg Oral QAC breakfast  . lisinopril  10 mg Oral Daily  . oxyCODONE  5 mg Oral Q4H   Continuous:  YYQ:MGNOIBBCW, ALPRAZolam, cyclobenzaprine, HYDROmorphone (DILAUDID) injection, ondansetron, oxyCODONE, simethicone  Allergy Allergies  Allergen Reactions  . Adhesive [Tape] Other (See Comments)    Skin irritation, patient prefers paper tape.   Sheliah Hatch [Citalopram Hydrobromide] Other (See Comments)    Make patient too carefree     ROS:  A comprehensive review of systems was negative except for: Constitutional: positive for shaking but no fevers or chills Gastrointestinal: positive for abdominal pain  Blood pressure 129/87, pulse 71, temperature 98 F (36.7 C), temperature source Oral, resp. rate 18, height '5\' 7"'  (1.702 m), weight 236 lb (107 kg), last menstrual period 02/25/2016, SpO2 98 %. Physical Exam  Constitutional: She is oriented to person, place, and time and well-developed, well-nourished, and in no distress.  HENT:  Head: Normocephalic.  Eyes: Pupils are equal, round, and reactive to light.  Cardiovascular: Normal rate and regular rhythm.   Pulmonary/Chest: Effort normal and breath sounds normal.  Abdominal: Soft. She exhibits no distension. There is tenderness. There is no rebound and no guarding.  Port sites intact, dermabond, no spreading erythema,  minimal reactive erythema at dermabond, no drainage, no obvious swelling of the abdominal wall, no hematoma appreciated, tender around epigastric and RUQ port sites   Musculoskeletal: Normal range of motion.  Neurological: She is alert and oriented to person, place, and time.  Skin: Skin is warm and dry.  Psychiatric: Mood, memory, affect and judgment normal.  Vitals reviewed.   Results: Results for orders placed or performed during the hospital encounter of 12/30/16 (from the past 48 hour(s))  CBC with Differential/Platelet     Status: Abnormal   Collection Time: 12/30/16  9:14 AM  Result Value Ref  Range   WBC 14.6 (H) 4.0 - 10.5 K/uL   RBC 4.41 3.87 - 5.11 MIL/uL   Hemoglobin 13.8 12.0 - 15.0 g/dL   HCT 41.2 36.0 - 46.0 %   MCV 93.4 78.0 - 100.0 fL   MCH 31.3 26.0 - 34.0 pg   MCHC 33.5 30.0 - 36.0 g/dL   RDW 13.5 11.5 - 15.5 %   Platelets 226 150 - 400 K/uL   Neutrophils Relative % 67 %   Neutro Abs 9.8 (H) 1.7 - 7.7 K/uL   Lymphocytes Relative 25 %   Lymphs Abs 3.7 0.7 - 4.0 K/uL   Monocytes Relative 6 %   Monocytes Absolute 0.8 0.1 - 1.0 K/uL   Eosinophils Relative 2 %   Eosinophils Absolute 0.2 0.0 - 0.7 K/uL   Basophils Relative 0 %   Basophils Absolute 0.0 0.0 - 0.1 K/uL  Comprehensive metabolic panel     Status: Abnormal   Collection Time: 12/30/16  9:14 AM  Result Value Ref Range   Sodium 136 135 - 145 mmol/L   Potassium 3.6 3.5 - 5.1 mmol/L   Chloride 99 (L) 101 - 111 mmol/L   CO2 27 22 - 32 mmol/L   Glucose, Bld 97 65 - 99 mg/dL   BUN 10 6 - 20 mg/dL   Creatinine, Ser 0.95 0.44 - 1.00 mg/dL   Calcium 9.1 8.9 - 10.3 mg/dL   Total Protein 7.1 6.5 - 8.1 g/dL   Albumin 4.0 3.5 - 5.0 g/dL   AST 45 (H) 15 - 41 U/L   ALT 44 14 - 54 U/L   Alkaline Phosphatase 57 38 - 126 U/L   Total Bilirubin 0.5 0.3 - 1.2 mg/dL   GFR calc non Af Amer >60 >60 mL/min   GFR calc Af Amer >60 >60 mL/min    Comment: (NOTE) The eGFR has been calculated using the CKD EPI equation. This calculation has not been validated in all clinical situations. eGFR's persistently <60 mL/min signify possible Chronic Kidney Disease.    Anion gap 10 5 - 15  I-Stat CG4 Lactic Acid, ED     Status: None   Collection Time: 12/30/16  9:44 AM  Result Value Ref Range   Lactic Acid, Venous 1.40 0.5 - 1.9 mmol/L      Assessment & Plan:  Teresa Vincent is a 36 y.o. female with post operative pain at the epigastric port site where I closed the fascia and muscle. No signs of infection or hematoma on exam.  Will try to get pain under control in hospital. Think leukocytosis is reactive because  neutrophils 67.  -Observation -Scheduled Roxi 5 mg q4, Scheduled Tylenol and ibuprofen alternating, PRN Roxi and dilaudid for breakthrough  flexeril for muscle spasms PRN -Diet as tolerated -CBC in Am To recheck -H&H stable, no signs of hematoma  -No imaging indicated at this time, will decide  if need further workup in AM -SCDs, lovenox  -Ambulate   Updated RN in ED who will update Dr. Sabra Heck (he was in room when I came back to check in after results).   All questions were answered to the satisfaction of the patient.  Virl Cagey 12/30/2016, 11:01 AM

## 2016-12-30 NOTE — ED Notes (Signed)
Patient states Dr Henreitta Leber was her surgeon and that Dr Henreitta Leber was here at hospital and wants to be notified when she gets to ER room.

## 2016-12-31 LAB — CBC
HCT: 39.2 % (ref 36.0–46.0)
Hemoglobin: 12.8 g/dL (ref 12.0–15.0)
MCH: 31.4 pg (ref 26.0–34.0)
MCHC: 32.7 g/dL (ref 30.0–36.0)
MCV: 96.1 fL (ref 78.0–100.0)
PLATELETS: 212 10*3/uL (ref 150–400)
RBC: 4.08 MIL/uL (ref 3.87–5.11)
RDW: 13.8 % (ref 11.5–15.5)
WBC: 11.3 10*3/uL — AB (ref 4.0–10.5)

## 2016-12-31 MED ORDER — HYDROMORPHONE HCL 2 MG PO TABS
1.0000 mg | ORAL_TABLET | Freq: Once | ORAL | Status: AC
  Administered 2016-12-31: 1 mg via ORAL
  Filled 2016-12-31: qty 1

## 2016-12-31 MED ORDER — HYDROMORPHONE HCL 2 MG PO TABS: 1.0000 mg | ORAL_TABLET | Freq: Four times a day (QID) | ORAL | 0 refills | Status: DC | PRN

## 2016-12-31 MED ORDER — NICOTINE 14 MG/24HR TD PT24
14.0000 mg | MEDICATED_PATCH | Freq: Once | TRANSDERMAL | Status: DC
  Administered 2016-12-31: 14 mg via TRANSDERMAL
  Filled 2016-12-31: qty 1

## 2016-12-31 MED ORDER — POLYETHYLENE GLYCOL 3350 17 G PO PACK
17.0000 g | PACK | Freq: Every day | ORAL | Status: DC
  Administered 2016-12-31: 17 g via ORAL
  Filled 2016-12-31: qty 1

## 2016-12-31 NOTE — Discharge Summary (Signed)
Physician Discharge Summary  Patient ID: Teresa Vincent MRN: [id number] DOB/AGE: [birth year] 36 y.o.  Admit date: 12/30/2016 Discharge date: 12/31/2016  Admission Diagnoses:  Discharge Diagnoses:  Active Problems:   Post-operative pain   Discharged Condition: good  Hospital Course: [name] is a 36 yo who presented POD 1 with complaints of severe pain at her epigastric port site. She had no fevers or chills, and no issues with tolerating a diet, but had not slept and was having extreme pain at this area.  The area was localized to the port site. She was brought in for pain control, and did not find relief with roxicodone but did find better relief with dilaudid po.  She reports the pain is like a pulling or stabbing sensation at the port site.  She has been otherwise tolerating a diet and resting.   Consults: None   Significant Diagnostic Studies: labs: CBC down to 11.3, on arrival 14 was likely reactive   Treatments: IV hydration and analgesia: acetaminophen and Dilaudid  Discharge Exam: Blood pressure 115/61, pulse 76, temperature 98.2 F (36.8 C), temperature source Oral, resp. rate 16, height  (1.702 m), weight 236 lb (107 kg), last menstrual period 02/25/2016, SpO2 92 %. General appearance: alert, cooperative and no distress Resp: normal work breathing GI: soft, tender at epigastric site, but not tender elsewhere, no reboudn or guarding, no erythema or drainage at the port sites  Extremities: extremities normal, atraumatic, no cyanosis or edema  Disposition: 01-Home or Self Care  Discharge Instructions    Call MD for:  difficulty breathing, headache or visual disturbances    Complete by:  As directed    Call MD for:  persistant dizziness or light-headedness    Complete by:  As directed    Call MD for:  persistant nausea and vomiting    Complete by:  As directed    Call MD for:  redness, tenderness, or signs of infection (pain, swelling, redness, odor or  green/yellow discharge around incision site)    Complete by:  As directed    Call MD for:  severe uncontrolled pain    Complete by:  As directed    Call MD for:  temperature >100.4    Complete by:  As directed    Diet - low sodium heart healthy    Complete by:  As directed    Discharge instructions    Complete by:  As directed    Alternate tylenol and ibuprofen for pain control. Stop taking the roxicodone if not working for your pain. Start taking the po dilaudid as prescribed.  Take colace/ miralax as needed for constipation. Do not drive or operate machinary while taking narcotic pain medication.   Increase activity slowly    Complete by:  As directed      Allergies as of 12/31/2016      Reactions   Adhesive [tape] Other (See Comments)   Skin irritation, patient prefers paper tape.    Celexa [citalopram Hydrobromide] Other (See Comments)   Make patient too carefree      Medication List    STOP taking these medications   oxyCODONE 5 MG immediate release tablet Commonly known as:  ROXICODONE     TAKE these medications   albuterol 108 (90 Base) MCG/ACT inhaler Commonly known as:  PROVENTIL HFA;VENTOLIN HFA Inhale 1-2 puffs into the lungs every 4 (four) hours as needed for wheezing or shortness of breath.   ALPRAZolam 0.5 MG tablet Commonly known as:  Prudy Feeler  TAKE 1 TABLET BY MOUTH AT BEDTIME AS NEEDED FOR  SLEEP What changed:  See the new instructions.   atorvastatin 20 MG tablet Commonly known as:  LIPITOR Take 1 tablet (20 mg total) by mouth daily.   benzonatate 100 MG capsule Commonly known as:  TESSALON One po QHS prn cough   Biotin 1 MG Caps Take 1 mg by mouth daily.   cyclobenzaprine 10 MG tablet Commonly known as:  FLEXERIL Take 1 tablet (10 mg total) by mouth 3 (three) times daily as needed for muscle spasms.   docusate sodium 100 MG capsule Commonly known as:  COLACE Take 1 capsule (100 mg total) by mouth 2 (two) times daily.   famotidine 20 MG  tablet Commonly known as:  PEPCID Take 1 tablet (20 mg total) by mouth 2 (two) times daily.   folic acid 400 MCG tablet Commonly known as:  FOLVITE Take 400 mcg by mouth daily.   gabapentin 600 MG tablet Commonly known as:  NEURONTIN Take 1 tablet (600 mg total) by mouth 3 (three) times daily.   GARCINIA CAMBOGIA-CHROMIUM PO Take 1 capsule by mouth daily.   HYDROmorphone 2 MG tablet Commonly known as:  DILAUDID Take 0.5 tablets (1 mg total) by mouth every 6 (six) hours as needed for severe pain.   levothyroxine 150 MCG tablet Commonly known as:  SYNTHROID, LEVOTHROID TAKE ONE TABLET BY MOUTH ONCE DAILY BEFORE BREAKFAST What changed:  how much to take  how to take this  when to take this  additional instructions   lisinopril 10 MG tablet Commonly known as:  PRINIVIL,ZESTRIL Take 1 tablet (10 mg total) by mouth daily.   ondansetron 4 MG disintegrating tablet Commonly known as:  ZOFRAN ODT Take 1 tablet (4 mg total) by mouth every 8 (eight) hours as needed for nausea or vomiting.   OVER THE COUNTER MEDICATION Take 500 mg by mouth daily. OTC Forskolin 500 mg   pyridOXINE 100 MG tablet Commonly known as:  VITAMIN B-6 Take 100 mg by mouth daily.   traMADol 50 MG tablet Commonly known as:  ULTRAM Take 1 tablet (50 mg total) by mouth every 8 (eight) hours as needed. What changed:  reasons to take this      Follow up in 1-2 weeks.   Signed: [name] 12/31/2016, 2:46 PM

## 2016-12-31 NOTE — Progress Notes (Signed)
PAtient discharged home with personal belongings, pain meds prescriptions and IV removed and site intact.

## 2016-12-31 NOTE — Discharge Instructions (Signed)
Alternate tylenol and ibuprofen for pain control. Stop taking the roxicodone if not working for your pain. Start taking the po dilaudid as prescribed.  Take colace/ miralax as needed for constipation. Do not drive or operate machinary while taking narcotic pain medication.    Laparoscopic Cholecystectomy, Care After This sheet gives you information about how to care for yourself after your procedure. Your doctor may also give you more specific instructions. If you have problems or questions, contact your doctor. Follow these instructions at home: Care for cuts from surgery (incisions)   Follow instructions from your doctor about how to take care of your cuts from surgery. Make sure you: ? Wash your hands with soap and water before you change your bandage (dressing). If you cannot use soap and water, use hand sanitizer. ? Change your bandage as told by your doctor. ? Leave stitches (sutures), skin glue, or skin tape (adhesive) strips in place. They may need to stay in place for 2 weeks or longer. If tape strips get loose and curl up, you may trim the loose edges. Do not remove tape strips completely unless your doctor says it is okay.  Do not take baths, swim, or use a hot tub until your doctor says it is okay. Ask your doctor if you can take showers. You may only be allowed to take sponge baths for bathing.  Check your surgical cut area every day for signs of infection. Check for: ? More redness, swelling, or pain. ? More fluid or blood. ? Warmth. ? Pus or a bad smell. Activity  Do not drive or use heavy machinery while taking prescription pain medicine.  Do not lift anything that is heavier than 10 lb (4.5 kg) until your doctor says it is okay.  Do not play contact sports until your doctor says it is okay.  Do not drive for 24 hours if you were given a medicine to help you relax (sedative).  Rest as needed. Do not return to work or school until your doctor says it is  okay. General instructions  Take over-the-counter and prescription medicines only as told by your doctor.  To prevent or treat constipation while you are taking prescription pain medicine, your doctor may recommend that you: ? Drink enough fluid to keep your pee (urine) clear or pale yellow. ? Take over-the-counter or prescription medicines. ? Eat foods that are high in fiber, such as fresh fruits and vegetables, whole grains, and beans. ? Limit foods that are high in fat and processed sugars, such as fried and sweet foods. Contact a doctor if:  You develop a rash.  You have more redness, swelling, or pain around your surgical cuts.  You have more fluid or blood coming from your surgical cuts.  Your surgical cuts feel warm to the touch.  You have pus or a bad smell coming from your surgical cuts.  You have a fever.  One or more of your surgical cuts breaks open. Get help right away if:  You have trouble breathing.  You have chest pain.  You have pain that is getting worse in your shoulders.  You faint or feel dizzy when you stand.  You have very bad pain in your belly (abdomen).  You are sick to your stomach (nauseous) for more than one day.  You have throwing up (vomiting) that lasts for more than one day.  You have leg pain. This information is not intended to replace advice given to you by your health care  provider. Make sure you discuss any questions you have with your health care provider. Document Released: 12/14/2007 Document Revised: 09/25/2015 Document Reviewed: 08/23/2015 Elsevier Interactive Patient Education  2017 [employer].

## 2017-01-01 ENCOUNTER — Telehealth: Payer: Self-pay | Admitting: Family Medicine

## 2017-01-01 ENCOUNTER — Encounter (HOSPITAL_COMMUNITY): Payer: Self-pay | Admitting: General Surgery

## 2017-01-01 MED ORDER — AZITHROMYCIN 250 MG PO TABS: ORAL_TABLET | ORAL | 0 refills | Status: DC

## 2017-01-01 NOTE — Telephone Encounter (Signed)
Patient is having chest congestion and wanting antibiotic called into [facility].

## 2017-01-01 NOTE — Telephone Encounter (Signed)
Spoke with patient and patient stated that the only symptoms she is having is cough and congestion. Denies fever. Had Gallbladder removed on Friday 12/29/16. Advised patient that she may need to be seen. Please advise?

## 2017-01-01 NOTE — Telephone Encounter (Signed)
Spoke with patient and informed her per Dr.Steve Luking- We are sending in Zpak. Patient verbalized understanding.

## 2017-01-01 NOTE — Telephone Encounter (Signed)
zpk

## 2017-01-02 ENCOUNTER — Emergency Department (HOSPITAL_COMMUNITY): Payer: Medicaid Other

## 2017-01-02 ENCOUNTER — Encounter (HOSPITAL_COMMUNITY): Payer: Self-pay | Admitting: *Deleted

## 2017-01-02 ENCOUNTER — Emergency Department (HOSPITAL_COMMUNITY)
Admission: EM | Admit: 2017-01-02 | Discharge: 2017-01-03 | Disposition: A | Discharge status: Home / Self Care | Payer: Medicaid Other | Attending: Emergency Medicine | Admitting: Emergency Medicine

## 2017-01-02 DIAGNOSIS — Z79899 Other long term (current) drug therapy: Secondary | ICD-10-CM | POA: Insufficient documentation

## 2017-01-02 DIAGNOSIS — E039 Hypothyroidism, unspecified: Secondary | ICD-10-CM | POA: Diagnosis not present

## 2017-01-02 DIAGNOSIS — F17210 Nicotine dependence, cigarettes, uncomplicated: Secondary | ICD-10-CM | POA: Insufficient documentation

## 2017-01-02 DIAGNOSIS — I10 Essential (primary) hypertension: Secondary | ICD-10-CM | POA: Insufficient documentation

## 2017-01-02 DIAGNOSIS — R109 Unspecified abdominal pain: Secondary | ICD-10-CM | POA: Insufficient documentation

## 2017-01-02 DIAGNOSIS — G8918 Other acute postprocedural pain: Secondary | ICD-10-CM | POA: Insufficient documentation

## 2017-01-02 LAB — CBC WITH DIFFERENTIAL/PLATELET
Basophils Absolute: 0 10*3/uL (ref 0.0–0.1)
Basophils Relative: 0 %
Eosinophils Absolute: 0.5 10*3/uL (ref 0.0–0.7)
Eosinophils Relative: 4 %
HCT: 40.5 % (ref 36.0–46.0)
Hemoglobin: 13.6 g/dL (ref 12.0–15.0)
Lymphocytes Relative: 26 %
Lymphs Abs: 3 10*3/uL (ref 0.7–4.0)
MCH: 31.2 pg (ref 26.0–34.0)
MCHC: 33.6 g/dL (ref 30.0–36.0)
MCV: 92.9 fL (ref 78.0–100.0)
Monocytes Absolute: 0.7 10*3/uL (ref 0.1–1.0)
Monocytes Relative: 7 %
Neutro Abs: 7.2 10*3/uL (ref 1.7–7.7)
Neutrophils Relative %: 63 %
Platelets: 232 10*3/uL (ref 150–400)
RBC: 4.36 MIL/uL (ref 3.87–5.11)
RDW: 13.3 % (ref 11.5–15.5)
WBC: 11.4 10*3/uL — ABNORMAL HIGH (ref 4.0–10.5)

## 2017-01-02 LAB — BASIC METABOLIC PANEL
Anion gap: 9 (ref 5–15)
BUN: 13 mg/dL (ref 6–20)
CO2: 27 mmol/L (ref 22–32)
Calcium: 9.3 mg/dL (ref 8.9–10.3)
Chloride: 102 mmol/L (ref 101–111)
Creatinine, Ser: 0.79 mg/dL (ref 0.44–1.00)
GFR calc Af Amer: >60 mL/min (ref >60)
GFR calc non Af Amer: >60 mL/min (ref >60)
Glucose, Bld: 109 mg/dL — ABNORMAL HIGH (ref 65–99)
Potassium: 3.8 mmol/L (ref 3.5–5.1)
Sodium: 138 mmol/L (ref 135–145)

## 2017-01-02 MED ORDER — IOPAMIDOL (ISOVUE-300) INJECTION 61%
INTRAVENOUS | Status: AC
  Administered 2017-01-02: 30 mL
  Filled 2017-01-02: qty 30

## 2017-01-02 MED ORDER — KETOROLAC TROMETHAMINE 30 MG/ML IJ SOLN
15.0000 mg | Freq: Once | INTRAMUSCULAR | Status: AC
  Administered 2017-01-02: 15 mg via INTRAVENOUS
  Filled 2017-01-02: qty 1

## 2017-01-02 MED ORDER — IOPAMIDOL (ISOVUE-300) INJECTION 61%
100.0000 mL | Freq: Once | INTRAVENOUS | Status: AC | PRN
  Administered 2017-01-02: 100 mL via INTRAVENOUS

## 2017-01-02 MED ORDER — HALOPERIDOL LACTATE 5 MG/ML IJ SOLN
2.0000 mg | Freq: Once | INTRAMUSCULAR | Status: AC
  Administered 2017-01-02: 2 mg via INTRAVENOUS
  Filled 2017-01-02: qty 1

## 2017-01-02 MED ORDER — HYDROMORPHONE HCL 1 MG/ML IJ SOLN
1.0000 mg | Freq: Once | INTRAMUSCULAR | Status: AC
  Administered 2017-01-02: 1 mg via INTRAVENOUS
  Filled 2017-01-02: qty 1

## 2017-01-02 MED ORDER — SODIUM CHLORIDE 0.9 % IV BOLUS (SEPSIS)
1000.0000 mL | Freq: Once | INTRAVENOUS | Status: AC
  Administered 2017-01-02: 1000 mL via INTRAVENOUS

## 2017-01-02 NOTE — ED Provider Notes (Signed)
[facility] EMERGENCY DEPARTMENT Provider Note   CSN: [id number] Arrival date & time: 01/02/17  1701     History   Chief Complaint Chief Complaint  Patient presents with  . Abdominal Pain    HPI Teresa Vincent is a 36 y.o. female.  HPI   61y female with abdominal pain. Status post cholecystectomy on Friday. Continued abdominal pain since then. Pain at rest which is significantly worse with movement. She states that her husband has to pull on her arms to sit her up. She was seen in the emergency room on 10/13 and admitted for symptom management. She was discharged on Dilaudid. She reports as being poor pain control despite taking this, Flexeril and Tylenol. No fevers or chills. No vomiting.  Past Medical History:  Diagnosis Date  . Abnormal Pap smear of cervix   . ANXIETY 08/30/2007  . Chronic back pain   . GRAVES' DISEASE 12/07/2008  . 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 INSOMNIA 08/30/2007  . Kidney stone   . MIGRAINE HEADACHE 08/30/2007  . Neck pain   . Sciatica   . Stroke [facility])     Patient Active Problem List   Diagnosis Date Noted  . Post-operative pain 12/30/2016  . Calculus of gallbladder without cholecystitis without obstruction   . Mixed hyperlipidemia 10/11/2016  . 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/10/2015  . Obesity due to excess calories 06/10/2015  . Left-sided low back pain with left-sided sciatica 05/03/2015  . Essential hypertension, benign 12/30/2012  . Esophageal reflux 12/30/2012  . 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 ANXIETY 08/30/2007  . MIGRAINE HEADACHE 08/30/2007  . Insomnia 08/30/2007    Past Surgical History:  Procedure Laterality Date  . CARPAL TUNNEL RELEASE    . CHOLECYSTECTOMY    . CHOLECYSTECTOMY N/A 12/29/2016   Procedure: LAPAROSCOPIC CHOLECYSTECTOMY;  Surgeon: [name], MD;  Location: [facility];  Service: General;  Laterality: N/A;  . LUMBAR EPIDURAL  INJECTION    . TOTAL ABDOMINAL HYSTERECTOMY W/ BILATERAL SALPINGOOPHORECTOMY N/A   . TUBAL LIGATION  2003    OB History    Gravida Para Term Preterm AB Living   SAB TAB Ectopic Multiple Live Births                   Home Medications    Prior to Admission medications   Medication Sig Start Date End Date Taking? Authorizing Provider  albuterol (PROVENTIL HFA;VENTOLIN HFA) 108 (90 Base) MCG/ACT inhaler Inhale 1-2 puffs into the lungs every 4 (four) hours as needed for wheezing or shortness of breath. 12/13/16  Yes [name], MD  ALPRAZolam Prudy Feeler) 0.5 MG tablet TAKE 1 TABLET BY MOUTH AT BEDTIME AS NEEDED FOR  SLEEP Patient taking differently: TAKE 0.5 MG BY MOUTH AT BEDTIME AS NEEDED FOR SLEEP 11/21/16  Yes [name], MD  atorvastatin (LIPITOR) 20 MG tablet Take 1 tablet (20 mg total) by mouth daily. 10/11/16  Yes Roma Kayser, MD  azithromycin (ZITHROMAX) 250 MG tablet Take as directed. Patient taking differently: Take 250-500 mg by mouth See admin instructions. Take as directed. 2 tabs on day 1 then 1 tablet on days 2 through 5 01/01/17  Yes [name], MD  Biotin 1 MG CAPS Take 1 mg by mouth daily.   Yes [provider]  cyclobenzaprine (FLEXERIL) 10 MG tablet Take 1 tablet (10 mg total) by mouth 3 (three) times  daily as needed for muscle spasms. 12/13/16  Yes [name], MD  folic acid (FOLVITE) 400 MCG tablet Take 400 mcg by mouth daily.   Yes [provider]  gabapentin (NEURONTIN) 600 MG tablet Take 1 tablet (600 mg total) by mouth 3 (three) times daily. 09/07/16  Yes Campbell Riches, NP  GARCINIA CAMBOGIA-CHROMIUM PO Take 1 capsule by mouth daily.   Yes [provider]  HYDROmorphone (DILAUDID) 2 MG tablet Take 0.5 tablets (1 mg total) by mouth every 6 (six) hours as needed for severe pain. 12/31/16  Yes [name], MD  levothyroxine (SYNTHROID, LEVOTHROID) 150 MCG tablet TAKE ONE TABLET BY MOUTH  ONCE DAILY BEFORE BREAKFAST Patient taking differently: Take 150 mcg by mouth daily before breakfast.  11/23/16  Yes Nida, Denman George, MD  lisinopril (PRINIVIL,ZESTRIL) 10 MG tablet Take 1 tablet (10 mg total) by mouth daily. 05/05/16  Yes [name], MD  OVER THE COUNTER MEDICATION Take 500 mg by mouth daily. OTC Forskolin 500 mg   Yes [provider]  pyridOXINE (VITAMIN B-6) 100 MG tablet Take 100 mg by mouth daily.   Yes [provider]  traMADol (ULTRAM) 50 MG tablet Take 1 tablet (50 mg total) by mouth every 8 (eight) hours as needed. Patient taking differently: Take 50 mg by mouth every 8 (eight) hours as needed for moderate pain.  11/27/16  Yes Campbell Riches, NP  benzonatate (TESSALON) 100 MG capsule One po QHS prn cough Patient not taking: Reported on 12/26/2016 11/27/16   [name], MD  docusate sodium (COLACE) 100 MG capsule Take 1 capsule (100 mg total) by mouth 2 (two) times daily. Patient not taking: Reported on 12/30/2016 12/29/16 12/29/17  [name], MD  famotidine (PEPCID) 20 MG tablet Take 1 tablet (20 mg total) by mouth 2 (two) times daily. Patient not taking: Reported on 12/26/2016 12/11/16   Samuel Jester, DO  ondansetron (ZOFRAN ODT) 4 MG disintegrating tablet Take 1 tablet (4 mg total) by mouth every 8 (eight) hours as needed for nausea or vomiting. Patient not taking: Reported on 12/26/2016 12/11/16   Samuel Jester, DO    Family History Family History  Problem Relation Age of Onset  . Hypertension Mother   . Thyroid disease Neg Hx     Social History Social History  Substance Use Topics  . Smoking status: Current Every Day Smoker    Packs/day: 1.00    Years: 26.00    Types: Cigarettes  . Smokeless tobacco: Never Used  . Alcohol use 0.0 oz/week     Comment: occasional     Allergies   Adhesive [tape] and Celexa [citalopram hydrobromide]   Review of Systems Review of Systems  All systems reviewed and  negative, other than as noted in HPI.  Physical Exam Updated Vital Signs BP 125/85 (BP Location: Right Arm)   Pulse 91   Temp 98 F (36.7 C) (Oral)   Resp 20   Ht  (1.702 m)   Wt 107 kg (236 lb)   LMP 02/25/2016 Comment: irregular periods  SpO2 100%   BMI 36.96 kg/m   Physical Exam  Constitutional: She appears well-developed and well-nourished. No distress.  HENT:  Head: Normocephalic and atraumatic.  Eyes: Conjunctivae are normal. Right eye exhibits no discharge. Left eye exhibits no discharge.  Neck: Neck supple.  Cardiovascular: Normal rate, regular rhythm and normal heart sounds.  Exam reveals no gallop and no friction rub.   No murmur  heard. Pulmonary/Chest: Effort normal and breath sounds normal. No respiratory distress.  Abdominal: Soft. She exhibits no distension. There is no tenderness.  Abdominal incisions appear to be healing well. Steri-Strips in place aside from tissue adhesive on the most right lateral one. Possible mild distention versus protuberant abdomen. Soft. Diffuse tenderness but focally worse periumbilically and in the epigastrium. There is no rebound or guarding. Somewhat hypoactive bowel sounds.  Musculoskeletal: She exhibits no edema or tenderness.  Neurological: She is alert.  Skin: Skin is warm and dry.  Psychiatric: She has a normal mood and affect. Her behavior is normal. Thought content normal.  Nursing note and vitals reviewed.    ED Treatments / Results  Labs (all labs ordered are listed, but only abnormal results are displayed) Labs Reviewed  CBC WITH DIFFERENTIAL/PLATELET - Abnormal; Notable for the following:       Result Value   WBC 11.4 (*)    All other components within normal limits  BASIC METABOLIC PANEL - Abnormal; Notable for the following:    Glucose, Bld 109 (*)    All other components within normal limits    EKG  EKG Interpretation None       Radiology No results found.  Procedures Procedures (including  critical care time)  Medications Ordered in ED Medications  sodium chloride 0.9 % bolus 1,000 mL (not administered)  HYDROmorphone (DILAUDID) injection 1 mg (not administered)  ketorolac (TORADOL) 30 MG/ML injection 15 mg (not administered)     Initial Impression / Assessment and Plan / ED Course  I have reviewed the triage vital signs and the nursing notes.  Pertinent labs & imaging results that were available during my care of the patient were reviewed by me and considered in my medical decision making (see chart for details).     36 year old female with abdominal pain status post cholecystectomy 4 days ago. Her symptoms seem out of proportion to her exam, but this is her second ED visit since her surgery and was previously admitted for pain control. Afebrile. Nontoxic appearance. Tender but no peritoneal signs. She is hemodynamically stable. At this point, will CT to evaluate for possible complication. Will treat her symptoms otherwise. Basic labs. Prescribed colace. Says has been having small bowel movements. May need more aggressive bowel regimen.   Care signed out to Dr Verdie Mosher with imaging pending.   Final Clinical Impressions(s) / ED Diagnoses   Final diagnoses:  Post-operative pain    New Prescriptions New Prescriptions   No medications on file     Raeford Razor, MD 01/02/17 2205

## 2017-01-02 NOTE — ED Triage Notes (Addendum)
Pt c/o mid to right abdominal pain that has been going on since gallbladder surgery on Friday. Pt was in the ED on Sat and admitted for pain control per pt. Pt left Sunday and was given prescription for Dilaudid  and pt has been taking it with no pain relief. Pt also c/o nausea and dizziness.

## 2017-01-03 NOTE — Discharge Instructions (Signed)
If you do not hear from Dr. Henreitta LeberBridges tomorrow, please call her office.  Please continue your home medications that Dr. Henreitta LeberBridges prescribed for pain. Your CT scan is reassuring today.  Please return for worsening symptoms, including fever, intractable vomiting, or any other symptoms concerning to you.

## 2017-01-03 NOTE — ED Provider Notes (Signed)
.   Please see previous physicians note regarding patient's presenting history and physical, initial ED course, and associated medical decision making.   36 year old female postop day 4 after cholecystectomy who presents with persistent right upper quadrant abdominal pain. Pending CT abdomen and pelvis to evaluate for complications at time of sign out.  CT visualized. There is no evidence of acute complications. There are postoperative changes. My reevaluation she states that if she does not move she does not have significant pain. However with movement and palpation she still has significant pain. This not a great story for a bile leak. Discussed with Dr. Lovell SheehanJenkins. He recommended discharge at this time. He will speak with Dr. Henreitta LeberBridges in the morning, who will follow-up with patient regarding ongoing management.  Discussed with patient. Strict return and follow-up instructions reviewed. She expressed understanding of all discharge instructions and felt comfortable with the plan of care.    Lavera GuiseLiu, Hetal Proano Duo, MD 01/03/17 [phone]

## 2017-01-09 ENCOUNTER — Other Ambulatory Visit: Payer: Self-pay | Admitting: General Surgery

## 2017-01-09 ENCOUNTER — Ambulatory Visit (INDEPENDENT_AMBULATORY_CARE_PROVIDER_SITE_OTHER): Payer: Self-pay | Admitting: General Surgery

## 2017-01-09 ENCOUNTER — Encounter: Payer: Self-pay | Admitting: General Surgery

## 2017-01-09 VITALS — BP 150/87 | HR 91 | Temp 98.7°F | Resp 18 | Ht 67.0 in | Wt 237.0 lb

## 2017-01-09 DIAGNOSIS — R5082 Postprocedural fever: Secondary | ICD-10-CM

## 2017-01-09 DIAGNOSIS — G8918 Other acute postprocedural pain: Secondary | ICD-10-CM

## 2017-01-09 NOTE — Progress Notes (Addendum)
[facility] History and Physical Note   HPI:  36 y.o. Female presents to clinic for post-op follow-up evaluation after laparoscopic cholecystectomy on 12/29/2016. She has had a complicated post operative course with pain issues at her epigastric port site, requiring rehospitalization the day after surgery for pain control, and then repeat visit to the ED for pain control issues. A CT scan was done then with no signs of hematoma in the area, and fluid from the surgicel at the gallbladder fossa site. Patient reports continuing to have severe pain in the epigastric port site. The pain is worse with movement and stretching. She says it is a constant tugging and stabbing pain, and her skin is hypersensitive in the area. She does have issues with chronic pain at baseline and already takes gabapentin for sciatica but this does not help per her report. She reports some fevers subjectively.  She has been having some loose stools, but nothing abnormal.    She needed dilaudid po for pain control after her rehospitalization.  She is also taking some flexeril for the pain with some benefit, but reports she is no longer taking the narcotics.   She has multiple things going on this week including her son's wedding, and the pain is limiting her ability to work and sleep. She is very distressed at the pain at this time.   Pathology- Gallbladder CHOLECYSTITIS AND CHOLELITHIASIS  Past Medical History:  Diagnosis Date  . Abnormal Pap smear of cervix   . ANXIETY 08/30/2007  . Chronic back pain   . GRAVES' DISEASE 12/07/2008  . 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 INSOMNIA 08/30/2007  . Kidney stone   . MIGRAINE HEADACHE 08/30/2007  . Neck pain   . Sciatica   . Stroke [facility])    Past Surgical History:  Procedure Laterality Date  . CARPAL TUNNEL RELEASE    . CHOLECYSTECTOMY    . CHOLECYSTECTOMY N/A 12/29/2016   Procedure: LAPAROSCOPIC CHOLECYSTECTOMY;  Surgeon: [name], MD;   Location: [facility];  Service: General;  Laterality: N/A;  . LUMBAR EPIDURAL INJECTION    . TOTAL ABDOMINAL HYSTERECTOMY W/ BILATERAL SALPINGOOPHORECTOMY N/A   . TUBAL LIGATION  2003   Family History  Problem Relation Age of Onset  . Hypertension Mother   . Thyroid disease Neg Hx    Social History   Social History  . Marital status: Single    Spouse name: N/A  . Number of children: N/A  . Years of education: N/A   Occupational History  . Doesn't work outside the home    Social History Main Topics  . Smoking status: Current Every Day Smoker    Packs/day: 1.00    Years: 26.00    Types: Cigarettes  . Smokeless tobacco: Never Used  . Alcohol use 0.0 oz/week     Comment: occasional  . Drug use: No  . Sexual activity: Not Currently    Birth control/ protection: Surgical   Other Topics Concern  . Not on file   Social History Narrative  . No narrative on file    Review of Systems:  Some low grade fevers Continue pain at the epigastric site  All other review of systems: otherwise negative   Vital Signs:  BP (!) 150/87   Pulse 91   Temp 98.7 F (37.1 C)   Resp 18   Ht 5\' 7"  (1.702 m)   Wt 237 lb (107.5 kg)   LMP 02/25/2016 Comment: irregular periods  BMI 37.12 kg/m  Physical Exam:  Physical Exam  Constitutional: She is oriented to person, place, and time and well-developed, well-nourished, and in no distress.  HENT:  Head: Normocephalic.  Eyes: No scleral icterus.  Cardiovascular: Normal rate.   Pulmonary/Chest: Effort normal.  Abdominal: Soft. She exhibits no distension.  Port sites healing, no signs of erythema or drainage, epigastric site sensitive to skin touch, no swelling noted  Musculoskeletal: Normal range of motion.  Neurological: She is alert and oriented to person, place, and time.  Skin: Skin is warm and dry.    Laboratory studies: 10/16- WBC 11.4, neutrophils 63  Imaging:   Radiology review: CT a/p 10/16- reviewed personally, gallbladder  fossa collection consistent with surgicel  IMPRESSION: 1. Surgical absence of the gallbladder. Fluid in small amount of gas within the gallbladder fossa, likely representing postoperative collection. 2. Subcutaneous emphysema in the right abdominal wall and fluid in the right flank musculature is also likely postoperative. 3. No evidence of bowel obstruction or inflammation. No bile duct or pancreatic ductal dilatation. 4. Nonobstructing intrarenal stones on the right. 5. Mild aortic atherosclerosis.   Assessment:  36 y.o. yo Female with post operative pain and fevers.  Given the fevers will get labs. Have discussed that the pain is from the vicryl suture used to closed the port site and that this should start to get better over the next few weeks as the vicryl dissolves. This all sounds like muscle/ nerve pain related to the suture.   Plan:  - CBC and CMP to review labs and verify no further imaging needed to assess for biloma, etc   - Local injection at the epigastric port site for further pain control, possible to help pain or at least relief it for the next little bit given her busy week  - Unable to prescribe any gabapentin as she is already on this chronically  - Follow up following the injection.   All of the above recommendations were discussed with the patient,  and all of patient's questions were answered to her expressed satisfaction.  Algis GreenhouseLindsay Antwoine Zorn, MD [facility] [address] [place], [place] [address] [phone] (office)

## 2017-01-09 NOTE — H&P (Signed)
[facility] History and Physical Note   HPI:  36 y.o. Female presents to clinic for post-op follow-up evaluation after laparoscopic cholecystectomy on 12/29/2016. She has had a complicated post operative course with pain issues at her epigastric port site, requiring rehospitalization the day after surgery for pain control, and then repeat visit to the ED for pain control issues. A CT scan was done then with no signs of hematoma in the area, and fluid from the surgicel at the gallbladder fossa site. Patient reports continuing to have severe pain in the epigastric port site. The pain is worse with movement and stretching. She says it is a constant tugging and stabbing pain, and her skin is hypersensitive in the area. She does have issues with chronic pain at baseline and already takes gabapentin for sciatica but this does not help per her report. She reports some fevers subjectively.  She has been having some loose stools, but nothing abnormal.    She needed dilaudid po for pain control after her rehospitalization.  She is also taking some flexeril for the pain with some benefit, but reports she is no longer taking the narcotics.   She has multiple things going on this week including her son's wedding, and the pain is limiting her ability to work and sleep. She is very distressed at the pain at this time.       Past Medical History:  Diagnosis Date  . Abnormal Pap smear of cervix   . ANXIETY 08/30/2007  . Chronic back pain   . GRAVES' DISEASE 12/07/2008  . 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 INSOMNIA 08/30/2007  . Kidney stone   . MIGRAINE HEADACHE 08/30/2007  . Neck pain   . Sciatica   . Stroke [facility])         Past Surgical History:  Procedure Laterality Date  . CARPAL TUNNEL RELEASE    . CHOLECYSTECTOMY    . CHOLECYSTECTOMY N/A 12/29/2016   Procedure: LAPAROSCOPIC CHOLECYSTECTOMY;  Surgeon: Lucretia RoersBridges, Chirstine Defrain C, MD;  Location: [facility];  Service: General;   Laterality: N/A;  . LUMBAR EPIDURAL INJECTION    . TOTAL ABDOMINAL HYSTERECTOMY W/ BILATERAL SALPINGOOPHORECTOMY N/A   . TUBAL LIGATION  2003        Family History  Problem Relation Age of Onset  . Hypertension Mother   . Thyroid disease Neg Hx    Social History        Social History  . Marital status: Single    Spouse name: N/A  . Number of children: N/A  . Years of education: N/A       Occupational History  . Doesn't work outside the home          Social History Main Topics  . Smoking status: Current Every Day Smoker    Packs/day: 1.00    Years: 26.00    Types: Cigarettes  . Smokeless tobacco: Never Used  . Alcohol use 0.0 oz/week     Comment: occasional  . Drug use: No  . Sexual activity: Not Currently    Birth control/ protection: Surgical       Other Topics Concern  . Not on file      Social History Narrative  . No narrative on file    Review of Systems:  Some low grade fevers Continue pain at the epigastric site  All other review of systems: otherwise negative   Vital Signs:  BP (!) 150/87   Pulse 91   Temp 98.7 F (37.1 C)  Resp 18   Ht 5\' 7"  (1.702 m)   Wt 237 lb (107.5 kg)   LMP 02/25/2016 Comment: irregular periods  BMI 37.12 kg/m    Physical Exam:  Physical Exam  Constitutional: She is oriented to person, place, and time and well-developed, well-nourished, and in no distress.  HENT:  Head: Normocephalic.  Eyes: No scleral icterus.  Cardiovascular: Normal rate.   Pulmonary/Chest: Effort normal.  Abdominal: Soft. She exhibits no distension.  Port sites healing, no signs of erythema or drainage, epigastric site sensitive to skin touch, no swelling noted  Musculoskeletal: Normal range of motion.  Neurological: She is alert and oriented to person, place, and time.  Skin: Skin is warm and dry.    Laboratory studies: 10/16- WBC 11.4, neutrophils 63  Imaging:   Radiology review: CT a/p 10/16-  reviewed personally, gallbladder fossa collection consistent with surgicel  IMPRESSION: 1. Surgical absence of the gallbladder. Fluid in small amount of gas within the gallbladder fossa, likely representing postoperative collection. 2. Subcutaneous emphysema in the right abdominal wall and fluid in the right flank musculature is also likely postoperative. 3. No evidence of bowel obstruction or inflammation. No bile duct or pancreatic ductal dilatation. 4. Nonobstructing intrarenal stones on the right. 5. Mild aortic atherosclerosis.   Assessment:  36 y.o. yo Female with post operative pain and fevers.  Given the fevers will get labs. Have discussed that the pain is from the vicryl suture used to closed the port site and that this should start to get better over the next few weeks as the vicryl dissolves. This all sounds like muscle/ nerve pain related to the suture.   Plan:  - CBC and CMP to review labs and verify no further imaging needed to assess for biloma, etc   - Local injection at the epigastric port site for further pain control, possible to help pain or at least relief it for the next little bit given her busy week  - Unable to prescribe any gabapentin as she is already on this chronically  - Follow up following the injection.   All of the above recommendations were discussed with the patient,  and all of patient's questions were answered to her expressed satisfaction.  Algis Greenhouse, MD [facility] [address] [place], [place] [address] [phone] (office)

## 2017-01-09 NOTE — Patient Instructions (Addendum)
Go get labs today at [facility].  Come to [facility] 01/10/2017 for local injection at the site of pain.

## 2017-01-10 ENCOUNTER — Encounter (HOSPITAL_COMMUNITY)
Admission: RE | Disposition: A | Discharge status: Home / Self Care | Payer: Self-pay | Source: Ambulatory Visit | Attending: General Surgery

## 2017-01-10 ENCOUNTER — Ambulatory Visit (HOSPITAL_COMMUNITY)
Admission: RE | Admit: 2017-01-10 | Discharge: 2017-01-10 | Disposition: A | Discharge status: Home / Self Care | Payer: Medicaid Other | Source: Ambulatory Visit | Attending: General Surgery | Admitting: General Surgery

## 2017-01-10 DIAGNOSIS — F419 Anxiety disorder, unspecified: Secondary | ICD-10-CM | POA: Insufficient documentation

## 2017-01-10 DIAGNOSIS — F17210 Nicotine dependence, cigarettes, uncomplicated: Secondary | ICD-10-CM | POA: Insufficient documentation

## 2017-01-10 DIAGNOSIS — G8929 Other chronic pain: Secondary | ICD-10-CM | POA: Insufficient documentation

## 2017-01-10 DIAGNOSIS — Z9049 Acquired absence of other specified parts of digestive tract: Secondary | ICD-10-CM | POA: Diagnosis not present

## 2017-01-10 DIAGNOSIS — I700 Atherosclerosis of aorta: Secondary | ICD-10-CM | POA: Insufficient documentation

## 2017-01-10 DIAGNOSIS — E890 Postprocedural hypothyroidism: Secondary | ICD-10-CM | POA: Diagnosis not present

## 2017-01-10 DIAGNOSIS — G8918 Other acute postprocedural pain: Secondary | ICD-10-CM | POA: Diagnosis present

## 2017-01-10 DIAGNOSIS — Z8673 Personal history of transient ischemic attack (TIA), and cerebral infarction without residual deficits: Secondary | ICD-10-CM | POA: Insufficient documentation

## 2017-01-10 DIAGNOSIS — M5430 Sciatica, unspecified side: Secondary | ICD-10-CM | POA: Insufficient documentation

## 2017-01-10 HISTORY — PX: STERIOD INJECTION: SHX5046

## 2017-01-10 LAB — CBC WITH DIFFERENTIAL/PLATELET
BASOS: 0 %
Basophils Absolute: 0 10*3/uL (ref 0.0–0.2)
EOS (ABSOLUTE): 0.4 10*3/uL (ref 0.0–0.4)
Eos: 3 %
HEMATOCRIT: 42.5 % (ref 34.0–46.6)
HEMOGLOBIN: 14.4 g/dL (ref 11.1–15.9)
IMMATURE GRANS (ABS): 0 10*3/uL (ref 0.0–0.1)
Immature Granulocytes: 0 %
LYMPHS: 24 %
Lymphocytes Absolute: 3 10*3/uL (ref 0.7–3.1)
MCH: 31.2 pg (ref 26.6–33.0)
MCHC: 33.9 g/dL (ref 31.5–35.7)
MCV: 92 fL (ref 79–97)
MONOCYTES: 6 %
Monocytes Absolute: 0.8 10*3/uL (ref 0.1–0.9)
Neutrophils Absolute: 8 10*3/uL — ABNORMAL HIGH (ref 1.4–7.0)
Neutrophils: 67 %
Platelets: 314 10*3/uL (ref 150–379)
RBC: 4.61 x10E6/uL (ref 3.77–5.28)
RDW: 13.8 % (ref 12.3–15.4)
WBC: 12.2 10*3/uL — ABNORMAL HIGH (ref 3.4–10.8)

## 2017-01-10 LAB — COMPREHENSIVE METABOLIC PANEL
A/G RATIO: 1.8 (ref 1.2–2.2)
ALT: 19 IU/L (ref 0–32)
AST: 16 IU/L (ref 0–40)
Albumin: 4.5 g/dL (ref 3.5–5.5)
Alkaline Phosphatase: 88 IU/L (ref 39–117)
BUN/Creatinine Ratio: 13 (ref 9–23)
BUN: 12 mg/dL (ref 6–20)
Bilirubin Total: <0.2 mg/dL (ref 0.0–1.2)
CALCIUM: 9.7 mg/dL (ref 8.7–10.2)
CO2: 23 mmol/L (ref 20–29)
CREATININE: 0.96 mg/dL (ref 0.57–1.00)
Chloride: 102 mmol/L (ref 96–106)
GFR, EST AFRICAN AMERICAN: 88 mL/min/{1.73_m2} (ref >59)
GFR, EST NON AFRICAN AMERICAN: 76 mL/min/{1.73_m2} (ref >59)
Globulin, Total: 2.5 g/dL (ref 1.5–4.5)
Glucose: 91 mg/dL (ref 65–99)
POTASSIUM: 4.7 mmol/L (ref 3.5–5.2)
Sodium: 141 mmol/L (ref 134–144)
TOTAL PROTEIN: 7 g/dL (ref 6.0–8.5)

## 2017-01-10 SURGERY — MINOR STEROID INJECTION
Anesthesia: LOCAL

## 2017-01-10 MED ORDER — BUPIVACAINE LIPOSOME 1.3 % IJ SUSP
INTRAMUSCULAR | Status: AC
  Filled 2017-01-10: qty 20

## 2017-01-10 MED ORDER — BUPIVACAINE LIPOSOME 1.3 % IJ SUSP
INTRAMUSCULAR | Status: DC | PRN
  Administered 2017-01-10: 20 mL

## 2017-01-10 SURGICAL SUPPLY — 11 items
COVER PROBE FLX POLY STRL (MISCELLANEOUS) ×1 IMPLANT
DURAPREP 26ML APPLICATOR (WOUND CARE) ×1 IMPLANT
GAUZE SPONGE 4X4 12PLY STRL (GAUZE/BANDAGES/DRESSINGS) ×1 IMPLANT
GLOVE BIO SURGEON STRL SZ7 (GLOVE) ×1 IMPLANT
GLOVE BIOGEL PI IND STRL 6.5 (GLOVE) IMPLANT
GLOVE BIOGEL PI INDICATOR 6.5 (GLOVE) ×1
NDL HYPO 18GX1.5 BLUNT FILL (NEEDLE) IMPLANT
NDL HYPO 21X1.5 SAFETY (NEEDLE) IMPLANT
NEEDLE HYPO 18GX1.5 BLUNT FILL (NEEDLE) ×2 IMPLANT
NEEDLE HYPO 21X1.5 SAFETY (NEEDLE) ×2 IMPLANT
SYR CONTROL 10ML LL (SYRINGE) ×2 IMPLANT

## 2017-01-10 NOTE — Op Note (Signed)
[facility] Operative Note  01/10/17  Preoperative Diagnosis: Sub-acute Post operative pain at epigastric port site    Postoperative Diagnosis: Same   Procedure(s) Performed: Local injection using ultrasound guidance of rectus muscle and fascia    Surgeon: Leatrice JewelsLindsay C. Henreitta LeberBridges, MD   Assistants: No qualified resident was available   Anesthesia: Exparel 20mL Local anesthetic, no medications given    Anesthesiologist: None   Specimens: None   Estimated Blood Loss: None   Blood Replacement: None    Complications: None   Wound Class: Clean    Operative Indications: [name] is a 36 yo with Sub-acute post operative pain at her epigastric port site from the transfascial suture placement. She is having hypersensitivity at the area and difficulty with movement due to this pain and hypersensitivity.  Given these concerns, she was brought in for local injection of the muscle at this trigger point site to help with pain alleviation.   Findings: Normal ultrasound of anterior abdominal wall, no hematoma present    Procedure: The patient was taken to the minor procedure area.  A time out was performed. The upper abdomen was prepped with chloraprep.  Using ultrasound guidance, the needle was inserted around the port site at the epigastric area just right of midline and a skin wheel was made with Exparel.  This was carried deeper through the subcutaneous tissue into the layers of the anterior and posterior fascia of the rectus muscle.  A local block was performed circumferentially around this port site.  The patient experienced pain relief after injection.  The areas was dressed with a Band-Aid. A total of 20mL of Exparel was utilized.    All counts were correct at the end of the case. The patient was discharged from PACU in stable condition.   Algis GreenhouseLindsay Lache Dagher, MD [facility] [address] [place], [place] [address] [phone]  (office)

## 2017-01-10 NOTE — Interval H&P Note (Signed)
History and Physical Interval Note:  01/10/2017 8:20 AM  Teresa Vincent  has presented today for surgery, with the diagnosis of port pain  The various methods of treatment have been discussed with the patient and family. After consideration of risks, benefits and other options for treatment, the patient has consented to  Procedure(s): MINOR EXPAREL INJECTION (N/A) as a surgical intervention .  The patient's history has been reviewed, patient examined, no change in status, stable for surgery.  I have reviewed the patient's chart and labs.  Questions were answered to the patient's satisfaction.    Discussed risk and benefits, including infection, bleeding, not having effect on the pain.   Lucretia RoersLindsay C Davan Hark

## 2017-01-11 ENCOUNTER — Telehealth: Payer: Self-pay | Admitting: General Surgery

## 2017-01-11 ENCOUNTER — Ambulatory Visit: Payer: Medicaid Other | Admitting: General Surgery

## 2017-01-11 ENCOUNTER — Encounter: Payer: Self-pay | Admitting: General Surgery

## 2017-01-11 MED ORDER — OXYCODONE HCL 5 MG PO TABS: 5.0000 mg | ORAL_TABLET | ORAL | 0 refills | Status: DC | PRN

## 2017-01-11 NOTE — Telephone Encounter (Addendum)
Called by the Patient and returning call   Shot improved pain but pain still there. It is manageable with the Roxicodone now.  Able to sleep after taking Roxicodone last night.   She only has 2 pills left from her surgery and has only been using them as needed.  Will refill the Roxi 5mg  q4 PRN refilling 30 for now.  Reviewed the [place] substance abuse online. She has only received the medications from me from post op, and no other medications.   Algis GreenhouseLindsay Bridges, MD [facility] [address] [place], [place] [phone] (office)

## 2017-01-12 ENCOUNTER — Encounter (HOSPITAL_COMMUNITY): Payer: Self-pay | Admitting: General Surgery

## 2017-01-23 ENCOUNTER — Telehealth: Payer: Self-pay | Admitting: General Surgery

## 2017-01-23 ENCOUNTER — Ambulatory Visit: Payer: Medicaid Other | Admitting: General Surgery

## 2017-01-23 NOTE — Telephone Encounter (Signed)
Called patient about missing appointment.  She has a lot of social stuff going on.  Improving everyday. Some pain occasionaly.   She will call back for an appointment.   Algis GreenhouseLindsay Chryl Holten, MD [facility] [address] [place], [place] [address] [phone] (office)

## 2017-01-25 ENCOUNTER — Encounter: Payer: Self-pay | Admitting: General Surgery

## 2017-01-25 ENCOUNTER — Ambulatory Visit (INDEPENDENT_AMBULATORY_CARE_PROVIDER_SITE_OTHER): Payer: Medicaid Other | Admitting: General Surgery

## 2017-01-25 VITALS — BP 140/86 | HR 97 | Temp 98.4°F | Resp 18 | Ht 67.0 in | Wt 237.0 lb

## 2017-01-25 DIAGNOSIS — R110 Nausea: Secondary | ICD-10-CM

## 2017-01-25 DIAGNOSIS — K649 Unspecified hemorrhoids: Secondary | ICD-10-CM

## 2017-01-25 MED ORDER — ONDANSETRON 4 MG PO TBDP: 4.0000 mg | ORAL_TABLET | Freq: Three times a day (TID) | ORAL | 0 refills | Status: DC | PRN

## 2017-01-25 MED ORDER — FAMOTIDINE 20 MG PO TABS: 20.0000 mg | ORAL_TABLET | Freq: Two times a day (BID) | ORAL | 0 refills | Status: DC

## 2017-01-25 NOTE — Patient Instructions (Signed)
Bleeding likely from hemorrhoids, but could also be from a gastric ulcer. Take Prilosec for your stomach. Take the hemorrhoid cream as directed for hemorrhoids. Monitor bleeding. Take Zofran as needed for nausea.   Hemorrhoids Hemorrhoids are swollen veins in and around the rectum or anus. Hemorrhoids can cause pain, itching, or bleeding. Most of the time, they do not cause serious problems. They usually get better with diet changes, lifestyle changes, and other home treatments. Follow these instructions at home: Eating and drinking  Eat foods that have fiber, such as whole grains, beans, nuts, fruits, and vegetables. Ask your doctor about taking products that have added fiber (fibersupplements).  Drink enough fluid to keep your pee (urine) clear or pale yellow. For Pain and Swelling  Take a warm-water bath (sitz bath) for 20 minutes to ease pain. Do this 3-4 times a day.  If directed, put ice on the painful area. It may be helpful to use ice between your warm baths. ? Put ice in a plastic bag. ? Place a towel between your skin and the bag. ? Leave the ice on for 20 minutes, 2-3 times a day. General instructions  Take over-the-counter and prescription medicines only as told by your doctor. ? Medicated creams and medicines that are inserted into the anus (suppositories) may be used or applied as told.  Exercise often.  Go to the bathroom when you have the urge to poop (to have a bowel movement). Do not wait.  Avoid pushing too hard (straining) when you poop.  Keep the butt area dry and clean. Use wet toilet paper or moist paper towels.  Do not sit on the toilet for a long time. Contact a doctor if:  You have any of these: ? Pain and swelling that do not get better with treatment or medicine. ? Bleeding that will not stop. ? Trouble pooping or you cannot poop. ? Pain or swelling outside the area of the hemorrhoids. This information is not intended to replace advice given to you  by your health care provider. Make sure you discuss any questions you have with your health care provider. Document Released: 12/14/2007 Document Revised: 08/12/2015 Document Reviewed: 11/18/2014 Elsevier Interactive Patient Education  [employer]2018 Elsevier Inc.

## 2017-01-26 NOTE — Progress Notes (Signed)
[facility] Note   HPI:  36 y.o. Female presents to clinic for post operative pain at her epigastric site s/p laparoscopic cholecystectomy. She actually had to undergo a local injection at the site, which did relieve her pain for a period of time and decreased it thereafter. She has been doing well from the pain standpoint, but started to develop bleeding from her rectum in the last 2 days. She has filled the toilet with blood per her report and had clots. She also endorses some nausea but no reflux or abdominal pain. She has been taking some zofran.  She does not take NSAIDs.   Review of Systems:  + rectal bleeding + nausea Pain improved  All other review of systems: otherwise negative   Vital Signs:  BP 140/86   Pulse 97   Temp 98.4 F (36.9 C)   Resp 18   Ht 5\' 7"  (1.702 m)   Wt 237 lb (107.5 kg)   LMP 02/25/2016 Comment: irregular periods  BMI 37.12 kg/m    Physical Exam:  Physical Exam  Constitutional: She is oriented to person, place, and time and well-developed, well-nourished, and in no distress.  HENT:  Head: Normocephalic.  Cardiovascular: Normal rate and regular rhythm.  Pulmonary/Chest: Effort normal.  Abdominal: Soft. She exhibits no distension. There is no tenderness.  Genitourinary: Rectal exam shows internal hemorrhoid.  Genitourinary Comments: Three columns hemorrhoids, swollen, rectal with some fullness but otherwise negative, blood evident  Musculoskeletal: Normal range of motion.  Neurological: She is alert and oriented to person, place, and time.  Skin: Skin is warm and dry.  Psychiatric: Mood, memory, affect and judgment normal.    Laboratory studies: None   Imaging:  None   Assessment:  36 y.o. yo Female with likely internal hemorrhoid bleeding which has come on in the last few days. She is doing better from her pain at the epigastric port site. She has had minimal bleeding in the past related to hemorrhoids but nothing this  extensive. She is worried. She reports some family history of colon cancer, but nothing prior to age 36. She denies any pain with food intake.  I discussed that this is likely from her hemorrhoids given her age and the findings on exam. It could also be from a gastritis/ gastric ulcer but less likely and if bleeding that much to cause BRBPR would expect her vitals to be more deranged.    Plan:  - Zofran/ pepcid for the nausea and potential for any gastritis   - Hemorrhoid cream with hydrocortisone/ lidocaine / phenylephrine from [facility] ordered  - Will see in a few weeks  - If starts to have significant bleeding will go to the ER>  - Do not think we need to order any labs today given the amount of blood she describes   All of the above recommendations were discussed with the patient, and all of patient's questions were answered to her expressed satisfaction.  Algis GreenhouseLindsay Bridges, MD [facility] [address] [place], [place] [phone] (office)

## 2017-01-30 ENCOUNTER — Telehealth: Payer: Self-pay | Admitting: General Surgery

## 2017-01-30 ENCOUNTER — Encounter: Payer: Self-pay | Admitting: General Surgery

## 2017-01-30 NOTE — Telephone Encounter (Signed)
[facility]  Overall stomach pains improved and bleeding improved. Has been vomiting some. Taking zofran. And has been eating.   Complaining of feeling dizzy and lightheaded and seeing spots. Says the bleeding is better.   Right hand is also numb and tingling and tight and started yesterday and has not changed.  Has a history of a stroke in the past but did not have any prodromal symptoms.   Do not think that the patient has any issues related to her gallbladder surgery.  Was having hemorrhoidal bleeding but this is better. Unlikely that this is related unless her H&H is down but based on improving bleeding, I think it is unlikely.   PCP is Dr. Gerda DissLuking.   Does have thyroid disease and takes synthroid.    Warned against stroke symptoms and told her to go to the ED if needed. Told her to go to see PCP in the next few days and get some labs/ discuss other possibilities.   Algis GreenhouseLindsay Bridges, MD [facility] [address] [place], [place] [phone] (office)

## 2017-01-30 NOTE — Telephone Encounter (Signed)
Error. Duplicate encounter.

## 2017-01-31 ENCOUNTER — Encounter (HOSPITAL_COMMUNITY): Payer: Self-pay | Admitting: Emergency Medicine

## 2017-01-31 ENCOUNTER — Emergency Department (HOSPITAL_COMMUNITY)
Admission: EM | Admit: 2017-01-31 | Discharge: 2017-01-31 | Disposition: A | Discharge status: Home / Self Care | Payer: Medicaid Other | Attending: Emergency Medicine | Admitting: Emergency Medicine

## 2017-01-31 DIAGNOSIS — Z79899 Other long term (current) drug therapy: Secondary | ICD-10-CM | POA: Diagnosis not present

## 2017-01-31 DIAGNOSIS — Z9049 Acquired absence of other specified parts of digestive tract: Secondary | ICD-10-CM | POA: Insufficient documentation

## 2017-01-31 DIAGNOSIS — F17210 Nicotine dependence, cigarettes, uncomplicated: Secondary | ICD-10-CM | POA: Insufficient documentation

## 2017-01-31 DIAGNOSIS — F419 Anxiety disorder, unspecified: Secondary | ICD-10-CM | POA: Insufficient documentation

## 2017-01-31 DIAGNOSIS — E039 Hypothyroidism, unspecified: Secondary | ICD-10-CM | POA: Diagnosis not present

## 2017-01-31 DIAGNOSIS — Z8673 Personal history of transient ischemic attack (TIA), and cerebral infarction without residual deficits: Secondary | ICD-10-CM | POA: Diagnosis not present

## 2017-01-31 DIAGNOSIS — K625 Hemorrhage of anus and rectum: Secondary | ICD-10-CM | POA: Diagnosis not present

## 2017-01-31 LAB — CBC WITH DIFFERENTIAL/PLATELET
Basophils Absolute: 0 10*3/uL (ref 0.0–0.1)
Basophils Relative: 0 %
Eosinophils Absolute: 0.3 10*3/uL (ref 0.0–0.7)
Eosinophils Relative: 2 %
HCT: 42.7 % (ref 36.0–46.0)
HEMOGLOBIN: 14.3 g/dL (ref 12.0–15.0)
LYMPHS ABS: 3.4 10*3/uL (ref 0.7–4.0)
LYMPHS PCT: 29 %
MCH: 30.9 pg (ref 26.0–34.0)
MCHC: 33.5 g/dL (ref 30.0–36.0)
MCV: 92.2 fL (ref 78.0–100.0)
MONOS PCT: 10 %
Monocytes Absolute: 1.1 10*3/uL — ABNORMAL HIGH (ref 0.1–1.0)
NEUTROS PCT: 59 %
Neutro Abs: 6.9 10*3/uL (ref 1.7–7.7)
Platelets: 234 10*3/uL (ref 150–400)
RBC: 4.63 MIL/uL (ref 3.87–5.11)
RDW: 13.3 % (ref 11.5–15.5)
WBC: 11.7 10*3/uL — AB (ref 4.0–10.5)

## 2017-01-31 LAB — URINALYSIS, ROUTINE W REFLEX MICROSCOPIC
Bilirubin Urine: NEGATIVE
Glucose, UA: NEGATIVE mg/dL
KETONES UR: NEGATIVE mg/dL
Leukocytes, UA: NEGATIVE
NITRITE: NEGATIVE
PH: 6 (ref 5.0–8.0)
Protein, ur: NEGATIVE mg/dL
SPECIFIC GRAVITY, URINE: 1.01 (ref 1.005–1.030)

## 2017-01-31 LAB — COMPREHENSIVE METABOLIC PANEL
ALK PHOS: 80 U/L (ref 38–126)
ALT: 17 U/L (ref 14–54)
ANION GAP: 9 (ref 5–15)
AST: 19 U/L (ref 15–41)
Albumin: 4.4 g/dL (ref 3.5–5.0)
BILIRUBIN TOTAL: 0.4 mg/dL (ref 0.3–1.2)
BUN: 12 mg/dL (ref 6–20)
CALCIUM: 9.5 mg/dL (ref 8.9–10.3)
CO2: 25 mmol/L (ref 22–32)
CREATININE: 0.75 mg/dL (ref 0.44–1.00)
Chloride: 104 mmol/L (ref 101–111)
Glucose, Bld: 96 mg/dL (ref 65–99)
Potassium: 4 mmol/L (ref 3.5–5.1)
Sodium: 138 mmol/L (ref 135–145)
TOTAL PROTEIN: 7.7 g/dL (ref 6.5–8.1)

## 2017-01-31 NOTE — ED Triage Notes (Signed)
PT stated bright red blood with bowel movements x8 days. PT states her surgeon (Dr. Henreitta LeberBridges) told her to come to ED if the bleeding still persists. PT c/o generalized weakness and malaise and burning rectally.

## 2017-01-31 NOTE — Discharge Instructions (Signed)
Follow up with your surgeon at 2:15pm tomorrow

## 2017-01-31 NOTE — ED Provider Notes (Signed)
[facility] EMERGENCY DEPARTMENT Provider Note   CSN: [id number] Arrival date & time: 01/31/17  1706     History   Chief Complaint Chief Complaint  Patient presents with  . Rectal Bleeding    HPI Teresa Vincent is a 36 y.o. female.  Patient complains of rectal bleeding.  Patient states she has had 3 episodes of bleeding and has had previous bleeding that is been evaluated by her surgeon.   The history is provided by the patient.  Rectal Bleeding  Quality:  Bright red Amount:  Moderate Timing:  Constant Context: not anal fissures   Similar prior episodes: yes   Relieved by:  Nothing Worsened by:  Nothing Ineffective treatments:  None tried Associated symptoms: no abdominal pain     Past Medical History:  Diagnosis Date  . Abnormal Pap smear of cervix   . ANXIETY 08/30/2007  . Chronic back pain   . GRAVES' DISEASE 12/07/2008  . 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 INSOMNIA 08/30/2007  . Kidney stone   . MIGRAINE HEADACHE 08/30/2007  . Neck pain   . Sciatica   . Stroke [facility])     Patient Active Problem List   Diagnosis Date Noted  . Post-operative pain 12/30/2016  . Calculus of gallbladder without cholecystitis without obstruction   . Mixed hyperlipidemia 10/11/2016  . 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/10/2015  . Obesity due to excess calories 06/10/2015  . Left-sided low back pain with left-sided sciatica 05/03/2015  . Essential hypertension, benign 12/30/2012  . Esophageal reflux 12/30/2012  . 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 ANXIETY 08/30/2007  . MIGRAINE HEADACHE 08/30/2007  . Insomnia 08/30/2007    Past Surgical History:  Procedure Laterality Date  . CARPAL TUNNEL RELEASE    . CHOLECYSTECTOMY    . LUMBAR EPIDURAL INJECTION    . TOTAL ABDOMINAL HYSTERECTOMY W/ BILATERAL SALPINGOOPHORECTOMY N/A   . TUBAL LIGATION  2003    OB History    Gravida Para Term Preterm AB Living   4 1 1   3 1    SAB TAB Ectopic Multiple Live Births                   Home Medications    Prior to Admission medications   Medication Sig Start Date End Date Taking? Authorizing Provider  albuterol (PROVENTIL HFA;VENTOLIN HFA) 108 (90 Base) MCG/ACT inhaler Inhale 1-2 puffs into the lungs every 4 (four) hours as needed for wheezing or shortness of breath. 12/13/16  Yes [name], MD  ALPRAZolam Prudy Feeler) 0.5 MG tablet TAKE 1 TABLET BY MOUTH AT BEDTIME AS NEEDED FOR  SLEEP Patient taking differently: TAKE 0.5 MG BY MOUTH AT BEDTIME AS NEEDED FOR SLEEP 11/21/16  Yes [name], MD  atorvastatin (LIPITOR) 20 MG tablet Take 1 tablet (20 mg total) by mouth daily. 10/11/16  Yes Roma Kayser, MD  Biotin 1 MG CAPS Take 1 mg by mouth daily.   Yes [provider]  cyclobenzaprine (FLEXERIL) 10 MG tablet Take 1 tablet (10 mg total) by mouth 3 (three) times daily as needed for muscle spasms. 12/13/16  Yes [name], MD  famotidine (PEPCID) 20 MG tablet Take 1 tablet (20 mg total) 2 (two) times daily by mouth. 01/25/17  Yes [name], MD  folic acid (FOLVITE) 400 MCG tablet Take 400 mcg by mouth daily.   Yes [provider]  gabapentin (NEURONTIN) 600 MG tablet Take 1 tablet (600  mg total) by mouth 3 (three) times daily. 09/07/16  Yes Campbell RichesHoskins, Michole C, NP  HYDROmorphone (DILAUDID) 2 MG tablet Take 0.5 tablets (1 mg total) by mouth every 6 (six) hours as needed for severe pain. 12/31/16  Yes Lucretia RoersBridges, Lindsay C, MD  levothyroxine (SYNTHROID, LEVOTHROID) 150 MCG tablet TAKE ONE TABLET BY MOUTH ONCE DAILY BEFORE BREAKFAST Patient taking differently: Take 150 mcg by mouth daily before breakfast.  11/23/16  Yes Nida, Denman GeorgeGebreselassie W, MD  lisinopril (PRINIVIL,ZESTRIL) 10 MG tablet Take 1 tablet (10 mg total) by mouth daily. 05/05/16  Yes Merlyn AlbertLuking, William S, MD  ondansetron (ZOFRAN ODT) 4 MG disintegrating tablet Take 1 tablet (4 mg total) every 8 (eight) hours as  needed by mouth for nausea or vomiting. 01/25/17  Yes Lucretia RoersBridges, Lindsay C, MD  oxyCODONE (ROXICODONE) 5 MG immediate release tablet Take 1 tablet (5 mg total) by mouth every 4 (four) hours as needed for severe pain. 01/11/17  Yes Lucretia RoersBridges, Lindsay C, MD  pyridOXINE (VITAMIN B-6) 100 MG tablet Take 100 mg by mouth daily.   Yes [provider]  traMADol (ULTRAM) 50 MG tablet Take 1 tablet (50 mg total) by mouth every 8 (eight) hours as needed. Patient taking differently: Take 50 mg by mouth every 8 (eight) hours as needed for moderate pain.  11/27/16  Yes Campbell RichesHoskins, Dayja C, NP  azithromycin (ZITHROMAX) 250 MG tablet Take as directed. Patient taking differently: Take 250-500 mg by mouth See admin instructions. Take as directed. 2 tabs on day 1 then 1 tablet on days 2 through 5 01/01/17   Merlyn AlbertLuking, William S, MD  benzonatate (TESSALON) 100 MG capsule One po QHS prn cough Patient not taking: Reported on 12/26/2016 11/27/16   Merlyn AlbertLuking, William S, MD  docusate sodium (COLACE) 100 MG capsule Take 1 capsule (100 mg total) by mouth 2 (two) times daily. Patient not taking: Reported on 12/30/2016 12/29/16 12/29/17  Lucretia RoersBridges, Lindsay C, MD    Family History Family History  Problem Relation Age of Onset  . Hypertension Mother   . Thyroid disease Neg Hx     Social History Social History   Tobacco Use  . Smoking status: Current Every Day Smoker    Packs/day: 1.00    Years: 26.00    Pack years: 26.00    Types: Cigarettes  . Smokeless tobacco: Never Used  Substance Use Topics  . Alcohol use: Yes    Alcohol/week: 0.0 oz    Comment: occasional  . Drug use: No     Allergies   Adhesive [tape] and Celexa [citalopram hydrobromide]   Review of Systems Review of Systems  Constitutional: Negative for appetite change and fatigue.  HENT: Negative for congestion, ear discharge and sinus pressure.   Eyes: Negative for discharge.  Respiratory: Negative for cough.   Cardiovascular: Negative for chest  pain.  Gastrointestinal: Positive for hematochezia. Negative for abdominal pain and diarrhea.       Rectal bleeding  Genitourinary: Negative for frequency and hematuria.  Musculoskeletal: Negative for back pain.  Skin: Negative for rash.  Neurological: Negative for seizures and headaches.  Psychiatric/Behavioral: Negative for hallucinations.     Physical Exam Updated Vital Signs BP 127/69 (BP Location: Right Arm)   Pulse 85   Temp 98.3 F (36.8 C) (Oral)   Resp 20   Ht 5\' 7"  (1.702 m)   Wt 107.5 kg (237 lb)   LMP 02/25/2016 Comment: irregular periods  SpO2 98%   BMI 37.12 kg/m   Physical Exam  Constitutional:  She is oriented to person, place, and time. She appears well-developed.  HENT:  Head: Normocephalic.  Eyes: Conjunctivae and EOM are normal. No scleral icterus.  Neck: Neck supple. No thyromegaly present.  Cardiovascular: Normal rate and regular rhythm. Exam reveals no gallop and no friction rub.  No murmur heard. Pulmonary/Chest: No stridor. She has no wheezes. She has no rales. She exhibits no tenderness.  Abdominal: She exhibits no distension. There is no tenderness. There is no rebound.  Genitourinary:  Genitourinary Comments: Rectum was visualized and appeared normal.  Patient did not want additional exam done at this time  Musculoskeletal: Normal range of motion. She exhibits no edema.  Lymphadenopathy:    She has no cervical adenopathy.  Neurological: She is oriented to person, place, and time. She exhibits normal muscle tone. Coordination normal.  Skin: No rash noted. No erythema.  Psychiatric: She has a normal mood and affect. Her behavior is normal.     ED Treatments / Results  Labs (all labs ordered are listed, but only abnormal results are displayed) Labs Reviewed  CBC WITH DIFFERENTIAL/PLATELET - Abnormal; Notable for the following components:      Result Value   WBC 11.7 (*)    Monocytes Absolute 1.1 (*)    All other components within normal  limits  URINALYSIS, ROUTINE W REFLEX MICROSCOPIC - Abnormal; Notable for the following components:   APPearance HAZY (*)    Hgb urine dipstick SMALL (*)    Bacteria, UA MANY (*)    Squamous Epithelial / LPF 0-5 (*)    All other components within normal limits  COMPREHENSIVE METABOLIC PANEL    EKG  EKG Interpretation None       Radiology No results found.  Procedures Procedures (including critical care time)  Medications Ordered in ED Medications - No data to display   Initial Impression / Assessment and Plan / ED Course  I have reviewed the triage vital signs and the nursing notes.  Pertinent labs & imaging results that were available during my care of the patient were reviewed by me and considered in my medical decision making (see chart for details).     Patient with rectal bleeding.  She has a normal hemoglobin and is not orthostatic.  I spoke with her surgeon who will follow her up tomorrow in the office.  Patient was reassured  Final Clinical Impressions(s) / ED Diagnoses   Final diagnoses:  Rectal bleeding    ED Discharge Orders    None       Bethann BerkshireZammit, Wave Calzada, MD 01/31/17 2131

## 2017-02-01 ENCOUNTER — Ambulatory Visit (INDEPENDENT_AMBULATORY_CARE_PROVIDER_SITE_OTHER): Payer: Medicaid Other | Admitting: General Surgery

## 2017-02-01 ENCOUNTER — Encounter: Payer: Self-pay | Admitting: General Surgery

## 2017-02-01 VITALS — BP 130/93 | HR 87 | Temp 98.9°F | Resp 18 | Ht 67.0 in | Wt 238.0 lb

## 2017-02-01 DIAGNOSIS — K625 Hemorrhage of anus and rectum: Secondary | ICD-10-CM | POA: Insufficient documentation

## 2017-02-01 NOTE — Progress Notes (Signed)
[facility] Note   HPI:  36 y.o. Female presents to clinic for rectal bleeding. She has been seeing me post op for her lap cholecystectomy and epigastric pain at the port site, but since that time developed BRBPR.  I saw her 11/8 and prescribed a cream and gave her reassurance, and then spoke with her on the phone a few days ago.  She went to the ED last night, and had labs done which were normal.   She is very anxious and sweet, and under a significant amount of stress per her report. She is no long having the epigastric port site pain and came today to discuss her hemorrhoids and bleeding.   Review of Systems:  BRBPR multiple times per day with BM or urination Anxiety/ stress  All other review of systems: otherwise negative   Vital Signs:  BP (!) 130/93   Pulse 87   Temp 98.9 F (37.2 C)   Resp 18   Ht 5\' 7"  (1.702 m)   Wt 238 lb (108 kg)   LMP 02/25/2016 Comment: irregular periods  BMI 37.28 kg/m    Physical Exam:  Physical Exam  Constitutional: She is well-developed, well-nourished, and in no distress.  Cardiovascular: Normal rate and regular rhythm.  Pulmonary/Chest: Effort normal.  Abdominal: Soft. She exhibits no distension. There is no tenderness.  Genitourinary: Rectal exam shows internal hemorrhoid.  Genitourinary Comments: No external hemorrhoids, no fissure, no internal exam today but with bearing down can see some hemorrhoidal columns   Vitals reviewed.   Laboratory studies:  Lab Results  Component Value Date   WBC 11.7 (H) 01/31/2017   HGB 14.3 01/31/2017   HCT 42.7 01/31/2017   MCV 92.2 01/31/2017   PLT 234 01/31/2017       Chemistry      Component Value Date/Time   NA 138 01/31/2017 1926   NA 141 01/09/2017 1435   K 4.0 01/31/2017 1926   CL 104 01/31/2017 1926   CO2 25 01/31/2017 1926   BUN 12 01/31/2017 1926   BUN 12 01/09/2017 1435   CREATININE 0.75 01/31/2017 1926   CREATININE 0.81 10/05/2016 1000      Component Value  Date/Time   CALCIUM 9.5 01/31/2017 1926   ALKPHOS 80 01/31/2017 1926   AST 19 01/31/2017 1926   ALT 17 01/31/2017 1926   BILITOT 0.4 01/31/2017 1926   BILITOT <0.2 01/09/2017 1435     Imaging:  None  Assessment:  36 y.o. yo Female with rectal bleeding likely from internal hemorrhoids. On my prior exam I felt fullness on rectal and she has columns with bearing down. Common things, this is hemorrhoidal bleeding that is not causing any clinical change in her H&H. Her only history of colon cancer is in family > 11050 yo.    Plan:  - Discussed with patient hemorrhoidal banding for internal hemorrhoids, which I do not perform, and discussed sending her to Dr. Darrick PennaFields to get this done as the least invasive and hopefully least uncomfortable option for stopping this bleeding  - Also discussed with patient again the need to see her PCP in order to get checked up and look into her other symptoms such as the right arm tingling, etc  - Follow up with me PRN  All of the above recommendations were discussed with the patient, and all of patient's questions were answered to her expressed satisfaction.  Algis GreenhouseLindsay Keimani Laufer, MD [facility] [address] [place], [place] [id number] [phone] (office)

## 2017-02-06 ENCOUNTER — Encounter: Payer: Self-pay | Admitting: Gastroenterology

## 2017-02-06 ENCOUNTER — Encounter: Payer: Self-pay | Admitting: Internal Medicine

## 2017-02-15 ENCOUNTER — Other Ambulatory Visit: Payer: Self-pay | Admitting: Nurse Practitioner

## 2017-02-21 ENCOUNTER — Other Ambulatory Visit: Payer: Self-pay | Admitting: Physical Medicine and Rehabilitation

## 2017-02-21 DIAGNOSIS — M4317 Spondylolisthesis, lumbosacral region: Secondary | ICD-10-CM

## 2017-03-06 ENCOUNTER — Telehealth: Payer: Self-pay | Admitting: Nurse Practitioner

## 2017-03-06 NOTE — Telephone Encounter (Signed)
Pt called stating that Eber JonesCarolyn was going to look into increasing the dosage on her gabapentin. Pt is wanting to check on this. Pt is aware Eber JonesCarolyn is out till tomorrow. Please advise.

## 2017-03-07 ENCOUNTER — Ambulatory Visit
Admission: RE | Admit: 2017-03-07 | Discharge: 2017-03-07 | Disposition: A | Discharge status: Home / Self Care | Payer: Medicaid Other | Source: Ambulatory Visit | Attending: Physical Medicine and Rehabilitation | Admitting: Physical Medicine and Rehabilitation

## 2017-03-07 ENCOUNTER — Encounter: Payer: Self-pay | Admitting: Nurse Practitioner

## 2017-03-07 ENCOUNTER — Other Ambulatory Visit: Payer: Self-pay | Admitting: Nurse Practitioner

## 2017-03-07 VITALS — BP 187/99 | HR 79

## 2017-03-07 DIAGNOSIS — M4317 Spondylolisthesis, lumbosacral region: Secondary | ICD-10-CM

## 2017-03-07 DIAGNOSIS — M5442 Lumbago with sciatica, left side: Principal | ICD-10-CM

## 2017-03-07 DIAGNOSIS — G8929 Other chronic pain: Secondary | ICD-10-CM

## 2017-03-07 MED ORDER — MEPERIDINE HCL 100 MG/ML IJ SOLN
75.0000 mg | Freq: Once | INTRAMUSCULAR | Status: AC
  Administered 2017-03-07: 75 mg via INTRAMUSCULAR

## 2017-03-07 MED ORDER — IOPAMIDOL (ISOVUE-M 200) INJECTION 41%
15.0000 mL | Freq: Once | INTRAMUSCULAR | Status: AC
  Administered 2017-03-07: 15 mL via INTRATHECAL

## 2017-03-07 MED ORDER — DIAZEPAM 5 MG PO TABS
10.0000 mg | ORAL_TABLET | Freq: Once | ORAL | Status: AC
  Administered 2017-03-07: 10 mg via ORAL

## 2017-03-07 NOTE — Discharge Instructions (Signed)
Myelogram Discharge Instructions  1. Go home and rest quietly for the next 24 hours.  It is important to lie flat for the next 24 hours.  Get up only to go to the restroom.  You may lie in the bed or on a couch on your back, your stomach, your left side or your right side.  You may have one pillow under your head.  You may have pillows between your knees while you are on your side or under your knees while you are on your back.  2. DO NOT drive today.  Recline the seat as far back as it will go, while still wearing your seat belt, on the way home.  3. You may get up to go to the bathroom as needed.  You may sit up for 10 minutes to eat.  You may resume your normal diet and medications unless otherwise indicated.  Drink lots of extra fluids today and tomorrow.  4. The incidence of headache, nausea, or vomiting is about 5% (one in 20 patients).  If you develop a headache, lie flat and drink plenty of fluids until the headache goes away.  Caffeinated beverages may be helpful.  If you develop severe nausea and vomiting or a headache that does not go away with flat bed rest, call [phone].  5. You may resume normal activities after your 24 hours of bed rest is over; however, do not exert yourself strongly or do any heavy lifting tomorrow. If when you get up you have a headache when standing, go back to bed and force fluids for another 24 hours.  6. Call your physician for a follow-up appointment.  The results of your myelogram will be sent directly to your physician by the following day.  7. If you have any questions or if complications develop after you arrive home, please call [phone].  Discharge instructions have been explained to the patient.  The patient, or the person responsible for the patient, fully understands these instructions YOU MAY RESTART YOUR TRAMADOL TOMORROW 03/08/2017 AT 1PM.

## 2017-03-21 ENCOUNTER — Other Ambulatory Visit: Payer: Self-pay | Admitting: Nurse Practitioner

## 2017-03-21 ENCOUNTER — Encounter: Payer: Self-pay | Admitting: Nurse Practitioner

## 2017-03-21 NOTE — Telephone Encounter (Signed)
Answered through my chart message.  

## 2017-03-29 ENCOUNTER — Ambulatory Visit: Payer: Medicaid Other | Admitting: Gastroenterology

## 2017-03-29 ENCOUNTER — Encounter: Payer: Self-pay | Admitting: Gastroenterology

## 2017-03-29 ENCOUNTER — Other Ambulatory Visit: Payer: Self-pay

## 2017-03-29 ENCOUNTER — Telehealth: Payer: Self-pay

## 2017-03-29 DIAGNOSIS — K625 Hemorrhage of anus and rectum: Secondary | ICD-10-CM | POA: Diagnosis not present

## 2017-03-29 DIAGNOSIS — K219 Gastro-esophageal reflux disease without esophagitis: Secondary | ICD-10-CM | POA: Diagnosis not present

## 2017-03-29 MED ORDER — NA SULFATE-K SULFATE-MG SULF 17.5-3.13-1.6 GM/177ML PO SOLN: 1.0000 | ORAL | 0 refills | Status: DC

## 2017-03-29 MED ORDER — PANTOPRAZOLE SODIUM 40 MG PO TBEC: DELAYED_RELEASE_TABLET | ORAL | 11 refills | Status: DC

## 2017-03-29 NOTE — Telephone Encounter (Signed)
Called and informed pt of pre-op appt 04/18/17 at 12:45pm. Letter mailed. 

## 2017-03-29 NOTE — Addendum Note (Signed)
Addended by: Corrie MckusickBOOTH, Shaheer Bonfield C on: 03/29/2017 09:39 AM   Modules accepted: Orders

## 2017-03-29 NOTE — Progress Notes (Signed)
Subjective:    Patient ID: Teresa Vincent, female    DOB: [birth year], 37 y.o.   MRN: [id number] [name], MD   HPI BLEEDING FROM HEMORRHOIDS. SOMETIMES THEY HURT. BLEED:  CONTINUOUSLY FOR A WEEK OR MO EVERY TIME SHE URINATES. SINCE GB CAME OUT IN OCT AND IT'S WORSE. BOWELS NOW IRREGULAR AND BEFORE WAS CONSTIPATED. HEARTBURN/INDIGESTION IN THE MIDDLE OF NIGHT OR AFTER SHE EATS WORSE SINCE GB CAME OUT. ATE STEAK AND ALL NIGHT SHE HAD SEVERE HEARTBURN FELT LIKE IT WAS IN HER THROAT. CIGS: <1 PK/DAY. NAUSEA: ALL THE TIME. BMs: #6 OR #4. ABDOMINAL PAIN AFTER EATING(SHARP, MOSTLY LOWER AND SOMETIMES UPPER, WHILE EATING, HAS BM ~10-20 MINS AND NOW A FEW DAY). NEVER HAD COLONOSCOPY. USES DICLOFENAC. NO ASPIRIN, BC/GOODY POWDERS, IBUPROFEN/MOTRIN, OR NAPROXEN/ALEVE.    PT DENIES FEVER, CHILLS, HEMATEMESIS, vomiting, melena, CHEST PAIN, SHORTNESS OF BREATH, CHANGE IN BOWEL IN HABITS, constipation, problems swallowing, OR problems with sedation.  Past Medical History:  Diagnosis Date  . Abnormal Pap smear of cervix   . ANXIETY 08/30/2007  . Chronic back pain   . GRAVES' DISEASE 12/07/2008  . 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 INSOMNIA 08/30/2007  . Kidney stone   . MIGRAINE HEADACHE 08/30/2007  . Neck pain   . Sciatica   . Stroke [facility])    Past Surgical History:  Procedure Laterality Date  . CARPAL TUNNEL RELEASE    . CHOLECYSTECTOMY    . CHOLECYSTECTOMY N/A 12/29/2016   Procedure: LAPAROSCOPIC CHOLECYSTECTOMY;  Surgeon: [name], MD;  Location: [facility] ORS;  Service: General;  Laterality: N/A;  . LUMBAR EPIDURAL INJECTION    . STERIOD INJECTION N/A 01/10/2017   Procedure: MINOR EXPAREL INJECTION;  Surgeon: [name], MD;  Location: [facility] ORS;  Service: General;  Laterality: N/A;  . TOTAL ABDOMINAL HYSTERECTOMY W/ BILATERAL SALPINGOOPHORECTOMY N/A   . TUBAL LIGATION  2003   Allergies  Allergen Reactions  . Adhesive [Tape] Other (See Comments)    Skin  irritation, patient prefers paper tape.   Budd Palmer [Citalopram Hydrobromide] Other (See Comments)    Make patient too carefree   Current Outpatient Medications  Medication Sig Dispense Refill  . albuterol (PROVENTIL HFA;VENTOLIN HFA) 108 (90 Base) MCG/ACT inhaler Inhale 1-2 puffs into the lungs every 4 (four) hours as needed for wheezing or shortness of breath.    . ALPRAZolam (XANAX) 0.5 MG tablet TAKE 1 TABLET BY MOUTH AT BEDTIME AS NEEDED FOR  SLEEP (Patient taking differently: TAKE 0.5 MG BY MOUTH AT BEDTIME AS NEEDED FOR SLEEP)    . atorvastatin (LIPITOR) 20 MG tablet Take 1 tablet (20 mg total) by mouth daily.    . cyclobenzaprine (FLEXERIL) 10 MG tablet Take 1 tablet (10 mg total) by mouth 3 (three) times daily as needed for muscle spasms.    . Diclofenac Potassium 50 MG PACK Take by mouth as needed.     . folic acid (FOLVITE) 400 MCG tablet Take 400 mcg by mouth daily.    Marland Kitchen gabapentin (NEURONTIN) 300 MG capsule TAKE 1 CAPSULE BY MOUTH IN THE MORNING AND 1 CAPSULE IN THE AFTERNOON AND 2 CAPSULES AT BEDTIME (Patient taking differently: TAKE 2 CAPSULE BY MOUTH IN THE MORNING AND 2 CAPSULE IN THE AFTERNOON AND 2 CAPSULES AT BEDTIME)    . levothyroxine (SYNTHROID, LEVOTHROID) 150 MCG tablet TAKE ONE TABLET BY MOUTH ONCE DAILY BEFORE BREAKFAST (Patient taking differently: Take 150 mcg by mouth daily before breakfast. )    . lisinopril (PRINIVIL,ZESTRIL)  10 MG tablet Take 1 tablet (10 mg total) by mouth daily.    . ondansetron (ZOFRAN ODT) 4 MG disintegrating tablet Take 1 tablet (4 mg total) every 8 (eight) hours as needed by mouth for nausea or vomiting.    . pyridOXINE (VITAMIN B-6) 100 MG tablet Take 100 mg by mouth daily.    . traMADol (ULTRAM) 50 MG tablet Take 1 tablet (50 mg total) by mouth every 8 (eight) hours as needed. (Patient taking differently: Take 50 mg by mouth every 8 (eight) hours as needed for moderate pain. )     Review of Systems PER HPI OTHERWISE ALL SYSTEMS ARE  NEGATIVE.    Objective:   Physical Exam  Constitutional: She is oriented to person, place, and time. She appears well-developed and well-nourished. No distress.  HENT:  Head: Normocephalic and atraumatic.  Mouth/Throat: Oropharynx is clear and moist. No oropharyngeal exudate.  Eyes: Pupils are equal, round, and reactive to light. No scleral icterus.  Neck: Normal range of motion. Neck supple.  Cardiovascular: Normal rate, regular rhythm and normal heart sounds.  Pulmonary/Chest: Effort normal and breath sounds normal. No respiratory distress.  Abdominal: Soft. Bowel sounds are normal. She exhibits no distension. There is no tenderness.  Musculoskeletal: She exhibits no edema.  Lymphadenopathy:    She has no cervical adenopathy.  Neurological: She is alert and oriented to person, place, and time.  NO FOCAL DEFICITS  Psychiatric: She has a normal mood and affect.  Vitals reviewed.     Assessment & Plan:

## 2017-03-29 NOTE — Patient Instructions (Addendum)
CONTINUE APOTHECARY CREAM FOUR TIMES A DAY.  COMPLETE COLONOSCOPY WITH PROPOFOL.  DRINK WATER TO KEEP YOUR URINE LIGHT YELLOW.  AVOID REFLUX TRIGGERS. SEE INFO BELOW.  FOLLOW A LOW FAT DIET. MEATS SHOULD BE BAKED, BROILED, OR BOILED. AVOID FRIED FOODS. SEE INFO BELOW.  CONTINUE YOUR WEIGHT LOSS EFFORTS. TRANSITION TO A PLANT BASED DIET-NO MEAT OR DAIRY for 6 MOS. AVOID ITEMS THAT CAUSE BLOATING & GAS. I RECOMMEND THE BOOK, "PREVENT AND REVERSE HEART DISEASE", CALDWELL ESSELSTYN JR., MD. PAGES 120-121 CLEARLY STATE HOW TO FOLLOW THE DIET AND THE LAST HALF OF THE BOOK HAS QUICK AND EASY RECIPES FOR BREAKFAST, LUNCH, AND DINNER ARE AFTER P 127.   START PROTONIX. TAKE 30 MINUTES PRIOR TO BREAKFAST.  FOLLOW UP IN 4 MOS.       Lifestyle and home remedies TO MANAGE REFLUX/CHEST PAIN  You may eliminate or reduce the frequency of heartburn by making the following lifestyle changes:  . Control your weight. Being overweight is a major risk factor for heartburn and GERD. Excess pounds put pressure on your abdomen, pushing up your stomach and causing acid to back up into your esophagus.   . Eat smaller meals. 4 TO 6 MEALS A DAY. This reduces pressure on the lower esophageal sphincter, helping to prevent the valve from opening and acid from washing back into your esophagus.   Allena Earing. Loosen your belt. Clothes that fit tightly around your waist put pressure on your abdomen and the lower esophageal sphincter.   . Eliminate heartburn triggers. Everyone has specific triggers. Common triggers such as fatty or fried foods, spicy food, tomato sauce, carbonated beverages, alcohol, chocolate, mint, garlic, onion, caffeine and nicotine may make heartburn worse.   Marland Kitchen. Avoid stooping or bending. Tying your shoes is OK. Bending over for longer periods to weed your garden isn't, especially soon after eating.   . Don't lie down after a meal. Wait at least three to four hours after eating before going to bed, and  don't lie down right after eating.   Marland Kitchen. PUT THE HEAD OF YOUR BED ON 6 INCH BLOCKS.   Alternative medicine . Several home remedies exist for treating GERD, but they provide only temporary relief. They include drinking baking soda (sodium bicarbonate) added to water or drinking other fluids such as baking soda mixed with cream of tartar and water.  . Although these liquids create temporary relief by neutralizing, washing away or buffering acids, eventually they aggravate the situation by adding gas and fluid to your stomach, increasing pressure and causing more acid reflux. Further, adding more sodium to your diet may increase your blood pressure and add stress to your heart, and excessive bicarbonate ingestion can alter the acid-base balance in your body.      Low-Fat Diet BREADS, CEREALS, PASTA, RICE, DRIED PEAS, AND BEANS These products are high in carbohydrates and most are low in fat. Therefore, they can be increased in the diet as substitutes for fatty foods. They too, however, contain calories and should not be eaten in excess. Cereals can be eaten for snacks as well as for breakfast.  Include foods that contain fiber (fruits, vegetables, whole grains, and legumes). Research shows that fiber may lower blood cholesterol levels, especially the water-soluble fiber found in fruits, vegetables, oat products, and legumes. FRUITS AND VEGETABLES It is good to eat fruits and vegetables. Besides being sources of fiber, both are rich in vitamins and some minerals. They help you get the daily allowances of these nutrients. Fruits and  vegetables can be used for snacks and desserts. MEATS Limit lean meat, chicken, [place], and fish to no more than 6 ounces per day. Beef, Pork, and Lamb Use lean cuts of beef, pork, and lamb. Lean cuts include:  Extra-lean ground beef.  Arm roast.  Sirloin tip.  Center-cut ham.  Round steak.  Loin chops.  Rump roast.  Tenderloin.  Trim all fat off the outside of  meats before cooking. It is not necessary to severely decrease the intake of red meat, but lean choices should be made. Lean meat is rich in protein and contains a highly absorbable form of iron. Premenopausal women, in particular, should avoid reducing lean red meat because this could increase the risk for low red blood cells (iron-deficiency anemia). Processed Meats Processed meats, such as bacon, bologna, salami, sausage, and hot dogs contain large quantities of fat, are not rich in valuable nutrients, and should not be eaten very often. Organ Meats The organ meats, such as liver, sweetbreads, kidneys, and brain are very rich in cholesterol. They should be limited. Chicken and [place] These are good sources of protein. The fat of poultry can be reduced by removing the skin and underlying fat layers before cooking. Chicken and [place] can be substituted for lean red meat in the diet. Poultry should not be fried or covered with high-fat sauces. Fish and Shellfish Fish is a good source of protein. Shellfish contain cholesterol, but they usually are low in saturated fatty acids. The preparation of fish is important. Like chicken and [place], they should not be fried or covered with high-fat sauces. EGGS Egg yolks often are hidden in cooked and processed foods. Egg whites contain no fat or cholesterol. They can be eaten often. Try 1 to 2 egg whites instead of whole eggs in recipes or use egg substitutes that do not contain yolk. MILK AND DAIRY PRODUCTS Use skim or 1% milk instead of 2% or whole milk. Decrease whole milk, natural, and processed cheeses. Use nonfat or low-fat (2%) cottage cheese or low-fat cheeses made from vegetable oils. Choose nonfat or low-fat (1 to 2%) yogurt. Experiment with evaporated skim milk in recipes that call for heavy cream. Substitute low-fat yogurt or low-fat cottage cheese for sour cream in dips and salad dressings. Have at least 2 servings of low-fat dairy products, such as 2  glasses of skim (or 1%) milk each day to help get your daily calcium intake. FATS AND OILS Reduce the total intake of fats, especially saturated fat. Butterfat, lard, and beef fats are high in saturated fat and cholesterol. These should be avoided as much as possible. Vegetable fats do not contain cholesterol, but certain vegetable fats, such as coconut oil, palm oil, and palm kernel oil are very high in saturated fats. These should be limited. These fats are often used in bakery goods, processed foods, popcorn, oils, and nondairy creamers. Vegetable shortenings and some peanut butters contain hydrogenated oils, which are also saturated fats. Read the labels on these foods and check for saturated vegetable oils. Unsaturated vegetable oils and fats do not raise blood cholesterol. However, they should be limited because they are fats and are high in calories. Total fat should still be limited to 30% of your daily caloric intake. Desirable liquid vegetable oils are corn oil, cottonseed oil, olive oil, canola oil, safflower oil, soybean oil, and sunflower oil. Peanut oil is not as good, but small amounts are acceptable. Buy a heart-healthy tub margarine that has no partially hydrogenated oils in the  ingredients. Mayonnaise and salad dressings often are made from unsaturated fats, but they should also be limited because of their high calorie and fat content. Seeds, nuts, peanut butter, olives, and avocados are high in fat, but the fat is mainly the unsaturated type. These foods should be limited mainly to avoid excess calories and fat. OTHER EATING TIPS Snacks  Most sweets should be limited as snacks. They tend to be rich in calories and fats, and their caloric content outweighs their nutritional value. Some good choices in snacks are graham crackers, melba toast, soda crackers, bagels (no egg), English muffins, fruits, and vegetables. These snacks are preferable to snack crackers, [place] fries, and chips. Popcorn  should be air-popped or cooked in small amounts of liquid vegetable oil. Desserts Eat fruit, low-fat yogurt, and fruit ices instead of pastries, cake, and cookies. Sherbet, angel food cake, gelatin dessert, frozen low-fat yogurt, or other frozen products that do not contain saturated fat (pure fruit juice bars, frozen ice pops) are also acceptable.  COOKING METHODS Choose those methods that use little or no fat. They include: Poaching.  Braising.  Steaming.  Grilling.  Baking.  Stir-frying.  Broiling.  Microwaving.  Foods can be cooked in a nonstick pan without added fat, or use a nonfat cooking spray in regular cookware. Limit fried foods and avoid frying in saturated fat. Add moisture to lean meats by using water, broth, cooking wines, and other nonfat or low-fat sauces along with the cooking methods mentioned above. Soups and stews should be chilled after cooking. The fat that forms on top after a few hours in the refrigerator should be skimmed off. When preparing meals, avoid using excess salt. Salt can contribute to raising blood pressure in some people. EATING AWAY FROM HOME Order entres, potatoes, and vegetables without sauces or butter. When meat exceeds the size of a deck of cards (3 to 4 ounces), the rest can be taken home for another meal. Choose vegetable or fruit salads and ask for low-calorie salad dressings to be served on the side. Use dressings sparingly. Limit high-fat toppings, such as bacon, crumbled eggs, cheese, sunflower seeds, and olives. Ask for heart-healthy tub margarine instead of butter.

## 2017-03-29 NOTE — Assessment & Plan Note (Addendum)
DIFFERENTIAL DIAGNOSIS INCLUDES HEMORRHOIDS, COLON POLYPS, AVMs, & LESS LIKELY COLON CA.   CONTINUE APOTHECARY CREAM FOUR TIMES A DAY. COMPLETE COLONOSCOPY WITH PROPOFOL. DISCUSSED PROCEDURE, BENEFITS, & RISKS: < 1% chance of medication reaction, bleeding, perforation, PELVIC VEIN SEPSIS, or rupture of spleen/liver. DRINK WATER TO KEEP YOUR URINE LIGHT YELLOW. FOLLOW UP IN 4 MOS.

## 2017-03-29 NOTE — Assessment & Plan Note (Signed)
SYMPTOMS NOT CONTROLLED.   AVOID REFLUX TRIGGERS.  HANDOUT GIVEN. FOLLOW A LOW FAT DIET. MEATS SHOULD BE BAKED, BROILED, OR BOILED. AVOID FRIED FOODS. SEE INFO BELOW. CONTINUE YOUR WEIGHT LOSS EFFORTS. TRANSITION TO A PLANT BASED DIET-NO MEAT OR DAIRY for 6 MOS. AVOID ITEMS THAT CAUSE BLOATING & GAS. I RECOMMEND THE BOOK, "PREVENT AND REVERSE HEART DISEASE", CALDWELL ESSELSTYN JR., MD. PAGES 120-121 CLEARLY STATE HOW TO FOLLOW THE DIET AND THE LAST HALF OF THE BOOK HAS QUICK AND EASY RECIPES FOR BREAKFAST, LUNCH, AND DINNER ARE AFTER P 127.  START PROTONIX. TAKE 30 MINUTES PRIOR TO BREAKFAST. FOLLOW UP IN 4 MOS.

## 2017-04-10 ENCOUNTER — Telehealth: Payer: Self-pay | Admitting: *Deleted

## 2017-04-10 NOTE — Telephone Encounter (Signed)
Patient called in needing to cancel her TCS on 04/24/17 at 1:00pm. She reports she needs to have back surgery and her surgeon feels she needs to hold off on the TCS until after her back surgery. Patient agree'd with this. She reports she will call us when she is ready to reschedule.   I called Endo and LM to cancel TCS. Fowarding to [facility] as an [place]

## 2017-04-10 NOTE — Telephone Encounter (Signed)
REVIEWED-NO ADDITIONAL RECOMMENDATIONS. 

## 2017-04-13 ENCOUNTER — Other Ambulatory Visit: Payer: Self-pay | Admitting: "Endocrinology

## 2017-04-13 ENCOUNTER — Other Ambulatory Visit: Payer: Self-pay | Admitting: Neurological Surgery

## 2017-04-13 ENCOUNTER — Telehealth: Payer: Self-pay | Admitting: Vascular Surgery

## 2017-04-13 DIAGNOSIS — E039 Hypothyroidism, unspecified: Secondary | ICD-10-CM

## 2017-04-13 NOTE — Telephone Encounter (Signed)
-----   Message from Retta Macebecca J Roberts, RN sent at 04/13/2017  2:39 PM EST ----- Regarding: sched office appointment Please make office appointment with Dr. Edilia Boickson  and call patient. ALIF L5-S1 surgery date is 05/15/17.

## 2017-04-13 NOTE — Telephone Encounter (Signed)
Sched appt 05/09/17 at 2:30. Spoke to pt and mailed new pt ppw.

## 2017-04-17 ENCOUNTER — Ambulatory Visit: Payer: Medicaid Other | Admitting: "Endocrinology

## 2017-04-18 ENCOUNTER — Other Ambulatory Visit: Payer: Self-pay | Admitting: *Deleted

## 2017-04-18 ENCOUNTER — Other Ambulatory Visit (HOSPITAL_COMMUNITY): Payer: Medicaid Other

## 2017-04-19 ENCOUNTER — Telehealth: Payer: Self-pay | Admitting: Family Medicine

## 2017-04-19 NOTE — Telephone Encounter (Signed)
Brett CanalesSteve, I personally have no idea what this is. I have prescribed Gabapentin but neither of us have prescribed opiates.

## 2017-04-19 NOTE — Telephone Encounter (Signed)
Patient said [facility] because they are now a [facility]-in program and that Dr.  Brett CanalesSteve will now be the only doctor that can prescribe her pain medication.  She is concerned because she is having back surgery on 05/15/17 back surgery and  Dr. Theresia Loiddy is going to be prescribing her pain medication.  She wants to know if Dr. Brett CanalesSteve knows anything about this?

## 2017-04-19 NOTE — Telephone Encounter (Signed)
Surgeons can still definitely prescribe pain meds to their pt

## 2017-04-20 ENCOUNTER — Telehealth: Payer: Self-pay | Admitting: Family Medicine

## 2017-04-20 NOTE — Telephone Encounter (Signed)
Please see the note Brendale sent you regarding lock in program.

## 2017-04-20 NOTE — Telephone Encounter (Signed)
I think she was told wrong, surgeons absdolutely have to presctribe pain med in the pre and post surgery phase of a patients management

## 2017-04-20 NOTE — Telephone Encounter (Signed)
Patient states she was told that another Dr.could prescribe, but Medicaid will refuse to pay if it is another Dr. Elnita Maxwellther than you.

## 2017-04-20 NOTE — Telephone Encounter (Signed)
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

## 2017-04-20 NOTE — Telephone Encounter (Signed)
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  Merlyn AlbertLuking, William S, MD  [facility] 1 hour ago (9:07 AM)   I think she was told wrong, surgeons absdolutely have to presctribe pain med in the pre and post surgery phase of a patients management  Joanne GavelSutton, Robinette Hainesrystal L, CMA  Merlyn AlbertLuking, William S, MD 1 hour ago (8:19 AM)   Patient states she was told that another Dr.could prescribe, but Medicaid will refuse to pay if it is another Dr. Elnita Maxwellther than you.  Merlyn AlbertLuking, William S, MD  [facility] 14 hours ago (7:35 PM)   Surgeons can still definitely prescribe pain meds to their pt  Campbell RichesHoskins, Doretha C, NP  Merlyn AlbertLuking, William S, MD 21 hours ago (12:29 PM)   Brett CanalesSteve, I personally have no idea what this is. I have prescribed Gabapentin but neither of us have prescribed opiates.  Margaretha SheffieldBrown, Autumn S, RN routed conversation to Campbell RichesHoskins, Paullette C, NP 23 hours ago (11:14 AM)  Bonne Doloresavis, Margie A routed conversation to [facility] 23 hours ago (11:10 AM)  Lenard LanceHeffinger, Latonja J [phone]  Bonne Doloresavis, Margie A 23 hours ago (11:07 AM)  Bonne Doloresavis, Margie A 23 hours ago (11:07 AM)   Patient said that she received a [facility] from [facility] saying that because they are now a lock-in program and that Dr.  Brett CanalesSteve will now be the only doctor that can prescribe her pain medication.  She is concerned because she is having back surgery on 05/15/17 back surgery and  Dr. Theresia Loiddy is going to be prescribing her pain medication.  She wants  to know if Dr. Brett CanalesSteve knows anything about this?

## 2017-04-20 NOTE — Telephone Encounter (Signed)
I called left am message to r/c.

## 2017-04-23 LAB — T4, FREE: FREE T4: 1.3 ng/dL (ref 0.8–1.8)

## 2017-04-23 LAB — TSH: TSH: 0.91 m[IU]/L

## 2017-04-24 ENCOUNTER — Ambulatory Visit (HOSPITAL_COMMUNITY): Admit: 2017-04-24 | Payer: Medicaid Other | Admitting: Gastroenterology

## 2017-04-24 ENCOUNTER — Encounter (HOSPITAL_COMMUNITY): Payer: Self-pay

## 2017-04-24 SURGERY — COLONOSCOPY WITH PROPOFOL
Anesthesia: Monitor Anesthesia Care

## 2017-04-27 ENCOUNTER — Encounter: Payer: Self-pay | Admitting: Nurse Practitioner

## 2017-04-27 ENCOUNTER — Encounter: Payer: Self-pay | Admitting: "Endocrinology

## 2017-04-27 ENCOUNTER — Ambulatory Visit (INDEPENDENT_AMBULATORY_CARE_PROVIDER_SITE_OTHER): Payer: Medicaid Other | Admitting: "Endocrinology

## 2017-04-27 VITALS — BP 109/74 | HR 79 | Ht 67.0 in | Wt 240.2 lb

## 2017-04-27 DIAGNOSIS — Z6836 Body mass index (BMI) 36.0-36.9, adult: Secondary | ICD-10-CM | POA: Diagnosis not present

## 2017-04-27 DIAGNOSIS — E782 Mixed hyperlipidemia: Secondary | ICD-10-CM | POA: Diagnosis not present

## 2017-04-27 DIAGNOSIS — I10 Essential (primary) hypertension: Secondary | ICD-10-CM

## 2017-04-27 DIAGNOSIS — E890 Postprocedural hypothyroidism: Secondary | ICD-10-CM | POA: Diagnosis not present

## 2017-04-27 DIAGNOSIS — E66812 Obesity, class 2: Secondary | ICD-10-CM

## 2017-04-27 MED ORDER — LEVOTHYROXINE SODIUM 150 MCG PO TABS: 150.0000 ug | ORAL_TABLET | Freq: Every day | ORAL | 6 refills | Status: DC

## 2017-04-27 MED ORDER — ATORVASTATIN CALCIUM 20 MG PO TABS: 20.0000 mg | ORAL_TABLET | Freq: Every day | ORAL | 6 refills | Status: DC

## 2017-04-27 MED ORDER — LISINOPRIL 10 MG PO TABS: 10.0000 mg | ORAL_TABLET | Freq: Every day | ORAL | 6 refills | Status: DC

## 2017-04-27 NOTE — Progress Notes (Signed)
Subjective:    Patient ID: Teresa Vincent, female    DOB: 01/12/1981, PCP Mikey Kirschner, MD   Past Medical History:  Diagnosis Date  . Abnormal Pap smear of cervix   . ANXIETY 08/30/2007  . Chronic back pain   . GRAVES' DISEASE 12/07/2008  . 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 INSOMNIA 08/30/2007  . Kidney stone   . MIGRAINE HEADACHE 08/30/2007  . Neck pain   . Sciatica   . Stroke [facility])    Past Surgical History:  Procedure Laterality Date  . CARPAL TUNNEL RELEASE    . CHOLECYSTECTOMY    . CHOLECYSTECTOMY N/A 12/29/2016   Procedure: LAPAROSCOPIC CHOLECYSTECTOMY;  Surgeon: Virl Cagey, MD;  Location: [facility];  Service: General;  Laterality: N/A;  . LUMBAR EPIDURAL INJECTION    . STERIOD INJECTION N/A 01/10/2017   Procedure: MINOR EXPAREL INJECTION;  Surgeon: Virl Cagey, MD;  Location: [facility];  Service: General;  Laterality: N/A;  . TOTAL ABDOMINAL HYSTERECTOMY W/ BILATERAL SALPINGOOPHORECTOMY N/A   . TUBAL LIGATION  2003   Social History   Socioeconomic History  . Marital status: Single    Spouse name: None  . Number of children: None  . Years of education: None  . Highest education level: None  Social Needs  . Financial resource strain: None  . Food insecurity - worry: None  . Food insecurity - inability: None  . Transportation needs - medical: None  . Transportation needs - non-medical: None  Occupational History  . Occupation: Doesn't work outside the home  Tobacco Use  . Smoking status: Current Every Day Smoker    Packs/day: 1.00    Years: 26.00    Pack years: 26.00    Types: Cigarettes  . Smokeless tobacco: Never Used  Substance and Sexual Activity  . Alcohol use: Yes    Alcohol/week: 0.0 oz    Comment: occasional  . Drug use: No  . Sexual activity: Not Currently    Birth control/protection: Surgical  Other Topics Concern  . None  Social History Narrative  . None   Outpatient Encounter Medications as of 04/27/2017   Medication Sig  . albuterol (PROVENTIL HFA;VENTOLIN HFA) 108 (90 Base) MCG/ACT inhaler Inhale 1-2 puffs into the lungs every 4 (four) hours as needed for wheezing or shortness of breath.  . ALPRAZolam (XANAX) 0.5 MG tablet TAKE 1 TABLET BY MOUTH AT BEDTIME AS NEEDED FOR  SLEEP (Patient taking differently: TAKE 0.5 MG BY MOUTH AT BEDTIME AS NEEDED FOR SLEEP)  . atorvastatin (LIPITOR) 20 MG tablet Take 1 tablet (20 mg total) by mouth daily.  . cyclobenzaprine (FLEXERIL) 10 MG tablet Take 1 tablet (10 mg total) by mouth 3 (three) times daily as needed for muscle spasms.  . Diclofenac Potassium 50 MG PACK Take by mouth as needed.   . folic acid (FOLVITE) 031 MCG tablet Take 400 mcg by mouth daily.  Marland Kitchen gabapentin (NEURONTIN) 300 MG capsule TAKE 1 CAPSULE BY MOUTH IN THE MORNING AND 1 CAPSULE IN THE AFTERNOON AND 2 CAPSULES AT BEDTIME (Patient taking differently: TAKE 2 CAPSULE BY MOUTH IN THE MORNING AND 2 CAPSULE IN THE AFTERNOON AND 2 CAPSULES AT BEDTIME)  . levothyroxine (SYNTHROID, LEVOTHROID) 150 MCG tablet Take 1 tablet (150 mcg total) by mouth daily before breakfast.  . lisinopril (PRINIVIL,ZESTRIL) 10 MG tablet Take 1 tablet (10 mg total) by mouth daily.  . Na Sulfate-K Sulfate-Mg Sulf (SUPREP BOWEL PREP KIT) 17.5-3.13-1.6 GM/177ML SOLN Take 1 kit by  mouth as directed.  . ondansetron (ZOFRAN ODT) 4 MG disintegrating tablet Take 1 tablet (4 mg total) every 8 (eight) hours as needed by mouth for nausea or vomiting.  . pantoprazole (PROTONIX) 40 MG tablet 1 PO 30 MINUTES PRIOR TO MEALS QD  . pyridOXINE (VITAMIN B-6) 100 MG tablet Take 100 mg by mouth daily.  . traMADol (ULTRAM) 50 MG tablet Take 1 tablet (50 mg total) by mouth every 8 (eight) hours as needed. (Patient taking differently: Take 50 mg by mouth every 8 (eight) hours as needed for moderate pain. )  . [DISCONTINUED] atorvastatin (LIPITOR) 20 MG tablet Take 1 tablet (20 mg total) by mouth daily.  . [DISCONTINUED] levothyroxine (SYNTHROID,  LEVOTHROID) 150 MCG tablet TAKE ONE TABLET BY MOUTH ONCE DAILY BEFORE BREAKFAST (Patient taking differently: Take 150 mcg by mouth daily before breakfast. )  . [DISCONTINUED] lisinopril (PRINIVIL,ZESTRIL) 10 MG tablet Take 1 tablet (10 mg total) by mouth daily.   No facility-administered encounter medications on file as of 04/27/2017.    ALLERGIES: Allergies  Allergen Reactions  . Adhesive [Tape] Other (See Comments)    Skin irritation, patient prefers paper tape.   Sheliah Hatch [Citalopram Hydrobromide] Other (See Comments)    Make patient too carefree   VACCINATION STATUS: Immunization History  Administered Date(s) Administered  . Influenza-Unspecified 12/11/2015, 12/13/2016  . Td 05/11/2006  . Tdap 12/13/2016    HPI  Teresa Vincent is a 37 year old female patient with medical history as above. She is here to follow-up for hypothyroidism. 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 She was treated with various doses of levothyroxine over the years currently at 150 g by mouth every morning. She has been more consistent in taking her thyroid hormone. She complains of progressive weight gain.  Her thyroid function tests are consistent with appropriate dose for replacement. -She also has hyperlipidemia on atorvastatin and hypertension on lisinopril. -She denies cold intolerance.  She reports some on and off palpitations, anxiety. -She is being prepared for spinal surgery in 2 weeks.  -She denies any family history of thyroid dysfunction. She denies any personal history of goiter nor family history of thyroid cancer. She denies heat or cold intolerance, palpitations, tremors.  Review of Systems  Constitutional: + Gait,  + fatigue, no subjective hyperthermia/hypothermia Eyes: no blurry vision, no xerophthalmia ENT: no sore throat, no nodules palpated in throat, no dysphagia/odynophagia, no hoarseness Cardiovascular:  no CP/SOB +palpitations,  -leg swelling Respiratory: no cough/SOB Gastrointestinal: no N/V/D/C Musculoskeletal: + Back pain with some radiculopathy towards her legs.   Skin: no rashes Neurological: no tremors/numbness/tingling/dizziness Psychiatric: no depression, +anxiety  Objective:    BP 109/74   Pulse 79   Ht _0  (1.702 m)   Wt 240 lb 3.2 oz (109 kg)   LMP 02/25/2016 Comment: irregular periods  SpO2 97%   BMI 37.62 kg/m   Wt Readings from Last 3 Encounters:  04/27/17 240 lb 3.2 oz (109 kg)  03/29/17 238 lb 12.8 oz (108.3 kg)  02/01/17 238 lb (108 kg)    Physical Exam  Constitutional: + Obese, not in acute distress. Eyes: PERRLA, EOMI, no exophthalmos ENT: moist mucous membranes, no thyromegaly, no cervical lymphadenopathy Cardiovascular: RRR, No MRG Respiratory: CTA B Gastrointestinal: abdomen soft, NT, ND, BS+ Musculoskeletal: no deformities, + limited spinal flexibility, strength intact in all 4 Skin: moist, warm, no rashes Neurological: no tremor with outstretched hands, DTR normal in all 4  Complete Blood Count (Most recent): Lab Results  Component Value Date   WBC 11.7 (H) 01/31/2017   HGB 14.3 01/31/2017   HCT 42.7 01/31/2017   MCV 92.2 01/31/2017   PLT 234 01/31/2017   Chemistry (most recent): Lab Results  Component Value Date   NA 138 01/31/2017   K 4.0 01/31/2017   CL 104 01/31/2017   CO2 25 01/31/2017   BUN 12 01/31/2017   CREATININE 0.75 01/31/2017   Recent Results (from the past 2160 hour(s))  Urinalysis, Routine w reflex microscopic     Status: Abnormal   Collection Time: 01/31/17  5:50 PM  Result Value Ref Range   Color, Urine YELLOW YELLOW   APPearance HAZY (A) CLEAR   Specific Gravity, Urine 1.010 1.005 - 1.030   pH 6.0 5.0 - 8.0   Glucose, UA NEGATIVE NEGATIVE mg/dL   Hgb urine dipstick SMALL (A) NEGATIVE   Bilirubin Urine NEGATIVE NEGATIVE   Ketones, ur NEGATIVE NEGATIVE mg/dL   Protein, ur NEGATIVE NEGATIVE mg/dL   Nitrite  NEGATIVE NEGATIVE   Leukocytes, UA NEGATIVE NEGATIVE   RBC / HPF 0-5 0 - 5 RBC/hpf   WBC, UA 0-5 0 - 5 WBC/hpf   Bacteria, UA MANY (A) NONE SEEN   Squamous Epithelial / LPF 0-5 (A) NONE SEEN   Mucus PRESENT   CBC with Differential     Status: Abnormal   Collection Time: 01/31/17  7:26 PM  Result Value Ref Range   WBC 11.7 (H) 4.0 - 10.5 K/uL   RBC 4.63 3.87 - 5.11 MIL/uL   Hemoglobin 14.3 12.0 - 15.0 g/dL   HCT 42.7 36.0 - 46.0 %   MCV 92.2 78.0 - 100.0 fL   MCH 30.9 26.0 - 34.0 pg   MCHC 33.5 30.0 - 36.0 g/dL   RDW 13.3 11.5 - 15.5 %   Platelets 234 150 - 400 K/uL   Neutrophils Relative % 59 %   Neutro Abs 6.9 1.7 - 7.7 K/uL   Lymphocytes Relative 29 %   Lymphs Abs 3.4 0.7 - 4.0 K/uL   Monocytes Relative 10 %   Monocytes Absolute 1.1 (H) 0.1 - 1.0 K/uL   Eosinophils Relative 2 %   Eosinophils Absolute 0.3 0.0 - 0.7 K/uL   Basophils Relative 0 %   Basophils Absolute 0.0 0.0 - 0.1 K/uL  Comprehensive metabolic panel     Status: None   Collection Time: 01/31/17  7:26 PM  Result Value Ref Range   Sodium 138 135 - 145 mmol/L   Potassium 4.0 3.5 - 5.1 mmol/L   Chloride 104 101 - 111 mmol/L   CO2 25 22 - 32 mmol/L   Glucose, Bld 96 65 - 99 mg/dL   BUN 12 6 - 20 mg/dL   Creatinine, Ser 0.75 0.44 - 1.00 mg/dL   Calcium 9.5 8.9 - 10.3 mg/dL   Total Protein 7.7 6.5 - 8.1 g/dL   Albumin 4.4 3.5 - 5.0 g/dL   AST 19 15 - 41 U/L   ALT 17 14 - 54 U/L   Alkaline Phosphatase 80 38 - 126 U/L   Total Bilirubin 0.4 0.3 - 1.2 mg/dL   GFR calc non Af Amer >60 >60 mL/min   GFR calc Af Amer >60 >60 mL/min    Comment: (NOTE) The eGFR has been calculated using the CKD EPI equation. This calculation has not been validated in all clinical situations. eGFR's persistently <60 mL/min signify possible Chronic Kidney Disease.    Anion gap  9 5 - 15  T4, Free     Status: None   Collection Time: 04/23/17  8:52 AM  Result Value Ref Range   Free T4 1.3 0.8 - 1.8 ng/dL  TSH     Status: None    Collection Time: 04/23/17  8:52 AM  Result Value Ref Range   TSH 0.91 mIU/L    Comment:           Reference Range .           > or = 20 Years  0.40-4.50 .                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  I will continue levothyroxine  150 g by mouth every morning.    - We discussed about correct intake of levothyroxine, at fasting, with water, separated by at least 30 minutes from breakfast, and separated by more than 4 hours from calcium, iron, multivitamins, acid reflux medications (PPIs). -Patient is made aware of the fact that thyroid hormone replacement is needed for life, dose to be adjusted by periodic monitoring of thyroid function tests.  -For weight gain, I have discussed a diet program higher in protein and lower in carbohydrates coupled with regular exercise combining aerobics, strength, and a stretch components. - She is interested in more options for weight loss. I discussed and gave her a brochure on bariatric surgery. She would benefit from this procedure given the fact that her BMI is now 37 along with hypertension and high cholesterol, if her insurance provides coverage.  -She admits that she drinks large quantities of fruit juices, I advised her to cut back on that.  2. Hyperlipidemia-  - Her lipid parameters are significantly abnormal including high LDL and low HDL along with high triglycerides. -She is on atorvastatin 20 mg p.o. nightly.  Side effects and precautions discussed with her with recommendation to continue and plan to repeat fasting lipid panel before her next visit.    3.hypertension: controlled to target. I advised her to continue  Lisinopril 10 mg by mouth daily.  - I advised patient to maintain close follow up with  Mikey Kirschner, MD for primary care needs. Follow up plan: Return in about 6 months (around 10/25/2017) for follow up with pre-visit labs. - Time spent with the patient: 25 min, of which >50% was spent in reviewing her  current and  previous labs, previous treatments, and medications  doses and developing a plan for long-term care.   Glade Lloyd, MD Phone: [phone]  Fax: [phone]  -  This note was partially dictated with voice recognition software. Similar sounding words can be transcribed inadequately or may not  be corrected upon review.  04/27/2017, 12:51 PM

## 2017-04-27 NOTE — Patient Instructions (Signed)

## 2017-04-30 ENCOUNTER — Ambulatory Visit: Payer: Medicaid Other | Admitting: Nurse Practitioner

## 2017-04-30 ENCOUNTER — Encounter: Payer: Self-pay | Admitting: Nurse Practitioner

## 2017-04-30 VITALS — BP 132/86 | Temp 97.8°F | Wt 239.8 lb

## 2017-04-30 DIAGNOSIS — B9689 Other specified bacterial agents as the cause of diseases classified elsewhere: Secondary | ICD-10-CM | POA: Diagnosis not present

## 2017-04-30 DIAGNOSIS — J069 Acute upper respiratory infection, unspecified: Secondary | ICD-10-CM

## 2017-04-30 MED ORDER — AMOXICILLIN-POT CLAVULANATE 875-125 MG PO TABS: 1.0000 | ORAL_TABLET | Freq: Two times a day (BID) | ORAL | 0 refills | Status: DC

## 2017-04-30 NOTE — Progress Notes (Signed)
Subjective:  Presents for head congestion that began 3 days ago. No fever. Headache with cough. Slight sore throat. Runny nose. Slight cough. Has not see the color of the mucus but reports a bad taste and smell. Left ear pain. Wheezing at times; using albuterol inhaler 3-4 times per day which helps but reports that tea with ginger helps "just as much". Has not used her inhaler today. No V/D or abd pain. Has major back surgery scheduled for 2/26.  Objective:   BP 132/86   Temp 97.8 F (36.6 C) (Oral)   Wt 239 lb 12.8 oz (108.8 kg)   LMP 02/25/2016 Comment: irregular periods  BMI 37.56 kg/m  NAD. Alert, oriented. TMs retracted bilat. Pharynx injected with green PND. Neck supple with mild anterior adenopathy. Lungs clear. Heart RRR.   Assessment:  Bacterial upper respiratory infection    Plan:   Meds ordered this encounter  Medications  . amoxicillin-clavulanate (AUGMENTIN) 875-125 MG tablet    Sig: Take 1 tablet by mouth 2 (two) times daily.    Dispense:  20 tablet    Refill:  0    Order Specific Question:   Supervising Provider    Answer:   Merlyn AlbertLUKING, WILLIAM S [[id number]]   Continue OTC meds and supplements but avoid use of any products as advised by surgeon. Notify pre op team about antibiotics when she goes for work up. Call back by end of the week if no better, sooner if worse.

## 2017-05-04 ENCOUNTER — Encounter: Payer: Self-pay | Admitting: Nurse Practitioner

## 2017-05-08 NOTE — Pre-Procedure Instructions (Signed)
Teresa Vincent  05/08/2017      [id number] - [place], [place] - [address] [place] [id number] Phone: [phone] Fax: [phone]    Your procedure is scheduled on Tuesday February 26.  [facility] 5:30 [facility].M.  Call this number if you have problems the morning of surgery:  [phone]   Remember:  Do not eat food or drink liquids after midnight.  Take these medicines the morning of surgery with A SIP OF WATER:   Levothyroxine (synthroid) Gabapentin (neurontin) Pantoprazole (protonix) Tramadol (ultram) if needed Cyclobenzaprine (flexeril) if needed Albuterol if needed (pleae bring inhaler to hospital with you)  7 days prior to surgery STOP taking any Aspirin(unless otherwise instructed by your surgeon), Aleve, Naproxen, Ibuprofen, Motrin, Advil, Goody's, BC's, all herbal medications, fish oil, and all vitamins    Do not wear jewelry, make-up or nail polish.  Do not wear lotions, powders, or perfumes, or deodorant.  Do not shave 48 hours prior to surgery.  Men may shave face and neck.  Do not bring valuables to the hospital.  [facility] is not responsible for any belongings or valuables.  Contacts, dentures or bridgework may not be worn into surgery.  Leave your suitcase in the car.  After surgery it may be brought to your room.  For patients admitted to the hospital, discharge time will be determined by your treatment team.  Patients discharged the day of surgery will not be allowed to drive home.    Special instructions:    [facility]- Preparing For Surgery  Before surgery, you can play an important role. Because skin is not sterile, your skin needs to be as free of germs as possible. You can reduce the number of germs on your skin by washing with CHG (chlorahexidine gluconate) Soap before surgery.  CHG is an antiseptic cleaner which kills germs and bonds with the skin to continue  killing germs even after washing.  Please do not use if you have an allergy to CHG or antibacterial soaps. If your skin becomes reddened/irritated stop using the CHG.  Do not shave (including legs and underarms) for at least 48 hours prior to first CHG shower. It is OK to shave your face.  Please follow these instructions carefully.   1. Shower the NIGHT BEFORE SURGERY and the MORNING OF SURGERY with CHG.   2. If you chose to wash your hair, wash your hair first as usual with your normal shampoo.  3. After you shampoo, rinse your hair and body thoroughly to remove the shampoo.  4. Use CHG as you would any other liquid soap. You can apply CHG directly to the skin and wash gently with a scrungie or a clean washcloth.   5. Apply the CHG Soap to your body ONLY FROM THE NECK DOWN.  Do not use on open wounds or open sores. Avoid contact with your eyes, ears, mouth and genitals (private parts). Wash Face and genitals (private parts)  with your normal soap.  6. Wash thoroughly, paying special attention to the area where your surgery will be performed.  7. Thoroughly rinse your body with warm water from the neck down.  8. DO NOT shower/wash with your normal soap after using and rinsing off the CHG Soap.  9. Pat yourself dry with a CLEAN TOWEL.  10. Wear CLEAN PAJAMAS to bed the night before surgery, wear comfortable clothes the morning of surgery  11. Place CLEAN SHEETS on your bed the night of your first shower and DO NOT SLEEP WITH PETS.    Day of Surgery: Do not apply any deodorants/lotions. Please wear clean clothes to the hospital/surgery center.      Please read over the following fact sheets that you were given. Coughing and Deep Breathing, MRSA Information and Surgical Site Infection Prevention

## 2017-05-09 ENCOUNTER — Encounter: Payer: Self-pay | Admitting: Vascular Surgery

## 2017-05-09 ENCOUNTER — Encounter (HOSPITAL_COMMUNITY): Payer: Self-pay

## 2017-05-09 ENCOUNTER — Ambulatory Visit (INDEPENDENT_AMBULATORY_CARE_PROVIDER_SITE_OTHER): Payer: Medicaid Other | Admitting: Vascular Surgery

## 2017-05-09 ENCOUNTER — Other Ambulatory Visit: Payer: Self-pay

## 2017-05-09 ENCOUNTER — Encounter (HOSPITAL_COMMUNITY): Payer: Self-pay | Admitting: Emergency Medicine

## 2017-05-09 ENCOUNTER — Encounter (HOSPITAL_COMMUNITY)
Admission: RE | Admit: 2017-05-09 | Discharge: 2017-05-09 | Disposition: A | Discharge status: Home / Self Care | Payer: Medicaid Other | Source: Ambulatory Visit | Attending: Neurological Surgery | Admitting: Neurological Surgery

## 2017-05-09 VITALS — BP 113/71 | HR 78 | Resp 18 | Ht 67.0 in | Wt 240.0 lb

## 2017-05-09 DIAGNOSIS — K219 Gastro-esophageal reflux disease without esophagitis: Secondary | ICD-10-CM | POA: Insufficient documentation

## 2017-05-09 DIAGNOSIS — E890 Postprocedural hypothyroidism: Secondary | ICD-10-CM | POA: Diagnosis not present

## 2017-05-09 DIAGNOSIS — M5137 Other intervertebral disc degeneration, lumbosacral region: Secondary | ICD-10-CM

## 2017-05-09 DIAGNOSIS — Z8673 Personal history of transient ischemic attack (TIA), and cerebral infarction without residual deficits: Secondary | ICD-10-CM | POA: Diagnosis not present

## 2017-05-09 DIAGNOSIS — Z87442 Personal history of urinary calculi: Secondary | ICD-10-CM | POA: Diagnosis not present

## 2017-05-09 DIAGNOSIS — Z79899 Other long term (current) drug therapy: Secondary | ICD-10-CM | POA: Insufficient documentation

## 2017-05-09 DIAGNOSIS — Z79890 Hormone replacement therapy (postmenopausal): Secondary | ICD-10-CM | POA: Diagnosis not present

## 2017-05-09 DIAGNOSIS — I10 Essential (primary) hypertension: Secondary | ICD-10-CM | POA: Insufficient documentation

## 2017-05-09 DIAGNOSIS — Z01812 Encounter for preprocedural laboratory examination: Secondary | ICD-10-CM | POA: Diagnosis present

## 2017-05-09 DIAGNOSIS — F17210 Nicotine dependence, cigarettes, uncomplicated: Secondary | ICD-10-CM | POA: Insufficient documentation

## 2017-05-09 DIAGNOSIS — F419 Anxiety disorder, unspecified: Secondary | ICD-10-CM | POA: Insufficient documentation

## 2017-05-09 DIAGNOSIS — Z0183 Encounter for blood typing: Secondary | ICD-10-CM | POA: Insufficient documentation

## 2017-05-09 DIAGNOSIS — Z8249 Family history of ischemic heart disease and other diseases of the circulatory system: Secondary | ICD-10-CM | POA: Diagnosis not present

## 2017-05-09 HISTORY — DX: Gastro-esophageal reflux disease without esophagitis: K21.9

## 2017-05-09 HISTORY — DX: Palpitations: R00.2

## 2017-05-09 HISTORY — DX: Personal history of urinary calculi: Z87.442

## 2017-05-09 HISTORY — DX: Essential (primary) hypertension: I10

## 2017-05-09 LAB — CBC
HEMATOCRIT: 45.6 % (ref 36.0–46.0)
HEMOGLOBIN: 15.3 g/dL — AB (ref 12.0–15.0)
MCH: 30.8 pg (ref 26.0–34.0)
MCHC: 33.6 g/dL (ref 30.0–36.0)
MCV: 91.8 fL (ref 78.0–100.0)
Platelets: 259 10*3/uL (ref 150–400)
RBC: 4.97 MIL/uL (ref 3.87–5.11)
RDW: 13.1 % (ref 11.5–15.5)
WBC: 8.5 10*3/uL (ref 4.0–10.5)

## 2017-05-09 LAB — BASIC METABOLIC PANEL
ANION GAP: 11 (ref 5–15)
BUN: 13 mg/dL (ref 6–20)
CO2: 23 mmol/L (ref 22–32)
Calcium: 9.3 mg/dL (ref 8.9–10.3)
Chloride: 104 mmol/L (ref 101–111)
Creatinine, Ser: 0.89 mg/dL (ref 0.44–1.00)
GFR calc Af Amer: >60 mL/min (ref >60)
Glucose, Bld: 111 mg/dL — ABNORMAL HIGH (ref 65–99)
POTASSIUM: 4.2 mmol/L (ref 3.5–5.1)
SODIUM: 138 mmol/L (ref 135–145)

## 2017-05-09 LAB — SURGICAL PCR SCREEN
MRSA, PCR: NEGATIVE
STAPHYLOCOCCUS AUREUS: NEGATIVE

## 2017-05-09 LAB — TYPE AND SCREEN
ABO/RH(D): O POS
ANTIBODY SCREEN: NEGATIVE

## 2017-05-09 LAB — ABO/RH: ABO/RH(D): O POS

## 2017-05-09 NOTE — Progress Notes (Signed)
PCP - [facility] Cardiologist - denies  EKG - 11/27/16  No cardiac studies per patient. Pt states she has had palpitations for years. In the past they would cause some dizziness. Pt states they come a couple times a week and go away when she coughs. Notified Angela with anesthesia, pt not currently having palpitations. NO need for EKG today, but pt encouraged to talk to PCP about these palpitations.   Patient denies shortness of breath, fever, cough and chest pain at PAT appointment   Patient verbalized understanding of instructions that were given to them at the PAT appointment. Patient was also instructed that they will need to review over the PAT instructions again at home before surgery.

## 2017-05-09 NOTE — Progress Notes (Signed)
Patient name: Teresa Vincent MRN: [id number] DOB: 01/20/1981 Sex: female  REASON FOR CONSULT:    To evaluate for anterior retroperitoneal exposure of L5-S1.  Consult is requested by Dr. Cyndy Freeze.  HPI:   Teresa Vincent is a pleasant 37 y.o. female, who was referred for evaluation for anterior retroperitoneal exposure of L5-S1.  I have reviewed the records from the referring office.  Patient was seen on 03/30/2017 by Dr. Cyndy Freeze.  Patient has had multiple injections without significant relief.  Patient is tried multiple medications and therapy continues to have significant back pain.  She was felt to be a good candidate for anterior lumbar interbody fusion at L5-S1.  She has a long history of back pain which she has had for many years.  She does not remember any specific injury.  She is a Marine scientist.  She describes significant pain in both lower extremities which is constant.  She experiences pain with sitting standing and walking.  There are really no alleviating factors.  I do not get any clear-cut history of claudication or rest pain.  Risk factors for peripheral vascular disease include hypertension and tobacco use.  She denies any history of diabetes, hypercholesterolemia, or family history of premature cardiovascular disease.  She has had previous laparoscopic surgery for cholecystectomy and hysterectomy.  Past Medical History:  Diagnosis Date  . Abnormal Pap smear of cervix   . ANXIETY 08/30/2007  . Chronic back pain   . GERD (gastroesophageal reflux disease)   . GRAVES' DISEASE 12/07/2008  . History of kidney stones   . Hypertension   . 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 INSOMNIA 08/30/2007  . MIGRAINE HEADACHE 08/30/2007  . Neck pain   . Palpitations    occasionally couple times a week  . Sciatica   . Stroke [facility])     Family History  Problem Relation Age of Onset  . Hypertension Mother   . Thyroid disease Neg Hx     SOCIAL HISTORY: She had smoked 1  pack/day of cigarettes for many years but is cut back to about a half a pack per day. Social History   Socioeconomic History  . Marital status: Single    Spouse name: Not on file  . Number of children: Not on file  . Years of education: Not on file  . Highest education level: Not on file  Social Needs  . Financial resource strain: Not on file  . Food insecurity - worry: Not on file  . Food insecurity - inability: Not on file  . Transportation needs - medical: Not on file  . Transportation needs - non-medical: Not on file  Occupational History  . Occupation: Doesn't work outside the home  Tobacco Use  . Smoking status: Current Every Day Smoker    Packs/day: 1.00    Years: 26.00    Pack years: 26.00    Types: Cigarettes  . Smokeless tobacco: Never Used  . Tobacco comment: currently 1 pack/3 days  Substance and Sexual Activity  . Alcohol use: Yes    Alcohol/week: 0.0 oz    Comment: occasional  . Drug use: No  . Sexual activity: Not Currently    Birth control/protection: Surgical  Other Topics Concern  . Not on file  Social History Narrative  . Not on file    Allergies  Allergen Reactions  . Adhesive [Tape] Other (See Comments)    Skin irritation, patient prefers paper tape.   Sheliah Hatch [Citalopram Hydrobromide] Other (See Comments)  Make patient too carefree    Current Outpatient Medications  Medication Sig Dispense Refill  . albuterol (PROVENTIL HFA;VENTOLIN HFA) 108 (90 Base) MCG/ACT inhaler Inhale 1-2 puffs into the lungs every 4 (four) hours as needed for wheezing or shortness of breath. 1 Inhaler 3  . ALPRAZolam (XANAX) 0.5 MG tablet TAKE 1 TABLET BY MOUTH AT BEDTIME AS NEEDED FOR  SLEEP (Patient taking differently: TAKE 0.5 MG BY MOUTH AT BEDTIME AS NEEDED FOR SLEEP) 30 tablet 5  . amoxicillin-clavulanate (AUGMENTIN) 875-125 MG tablet Take 1 tablet by mouth 2 (two) times daily. 20 tablet 0  . atorvastatin (LIPITOR) 20 MG tablet Take 1 tablet (20 mg total) by  mouth daily. 30 tablet 6  . cyclobenzaprine (FLEXERIL) 10 MG tablet Take 1 tablet (10 mg total) by mouth 3 (three) times daily as needed for muscle spasms. 30 tablet 3  . gabapentin (NEURONTIN) 300 MG capsule TAKE 1 CAPSULE BY MOUTH IN THE MORNING AND 1 CAPSULE IN THE AFTERNOON AND 2 CAPSULES AT BEDTIME (Patient taking differently: Take 600 mg by mouth 3 (three) times daily. TAKE 2 CAPSULE BY MOUTH IN THE MORNING AND 2 CAPSULE IN THE AFTERNOON AND 2 CAPSULES AT BEDTIME) 120 capsule 5  . ibuprofen (ADVIL,MOTRIN) 200 MG tablet Take 400-800 mg by mouth every 8 (eight) hours as needed (for pain/headaches).    Marland Kitchen levothyroxine (SYNTHROID, LEVOTHROID) 150 MCG tablet Take 1 tablet (150 mcg total) by mouth daily before breakfast. 30 tablet 6  . lisinopril (PRINIVIL,ZESTRIL) 10 MG tablet Take 1 tablet (10 mg total) by mouth daily. 30 tablet 6  . ondansetron (ZOFRAN ODT) 4 MG disintegrating tablet Take 1 tablet (4 mg total) every 8 (eight) hours as needed by mouth for nausea or vomiting. 14 tablet 0  . pantoprazole (PROTONIX) 40 MG tablet 1 PO 30 MINUTES PRIOR TO MEALS QD (Patient taking differently: Take 40 mg by mouth daily before breakfast. 30 MINUTES PRIOR TO MEAL) 31 tablet 11  . traMADol (ULTRAM) 50 MG tablet Take 1 tablet (50 mg total) by mouth every 8 (eight) hours as needed. (Patient taking differently: Take 50 mg by mouth every 8 (eight) hours as needed for moderate pain. ) 30 tablet 0  . Na Sulfate-K Sulfate-Mg Sulf (SUPREP BOWEL PREP KIT) 17.5-3.13-1.6 GM/177ML SOLN Take 1 kit by mouth as directed. (Patient not taking: Reported on 05/09/2017) 1 Bottle 0   No current facility-administered medications for this visit.     REVIEW OF SYSTEMS:  [X] denotes positive finding, [ ] denotes negative finding Cardiac  Comments:  Chest pain or chest pressure:    Shortness of breath upon exertion:    Short of breath when lying flat:    Irregular heart rhythm:        Vascular    Pain in calf, thigh, or hip  brought on by ambulation:    Pain in feet at night that wakes you up from your sleep:  x   Blood clot in your veins:    Leg swelling:         Pulmonary    Oxygen at home:    Productive cough:     Wheezing:         Neurologic    Sudden weakness in arms or legs:  x   Sudden numbness in arms or legs:  x   Sudden onset of difficulty speaking or slurred speech:    Temporary loss of vision in one eye:     Problems with dizziness:  Gastrointestinal    Blood in stool:     Vomited blood:         Genitourinary    Burning when urinating:     Blood in urine:        Psychiatric    Major depression:         Hematologic    Bleeding problems:    Problems with blood clotting too easily:        Skin    Rashes or ulcers:        Constitutional    Fever or chills:     PHYSICAL EXAM:   Vitals:   05/09/17 1439  BP: 113/71  Pulse: 78  Resp: 18  SpO2: 96%  Weight: 240 lb (108.9 kg)  Height: 5' 7" (1.702 m)  Body mass index is 37.59 kg/m.   GENERAL: The patient is a well-nourished female, in no acute distress. The vital signs are documented above. CARDIAC: There is a regular rate and rhythm.  VASCULAR: I do not detect carotid bruits. She has palpable femoral and palpable pedal pulses bilaterally. She has no significant lower extremity swelling. PULMONARY: There is good air exchange bilaterally without wheezing or rales. ABDOMEN: Soft and non-tender with normal pitched bowel sounds.  MUSCULOSKELETAL: There are no major deformities or cyanosis. NEUROLOGIC: No focal weakness or paresthesias are detected. SKIN: There are no ulcers or rashes noted. PSYCHIATRIC: The patient has a normal affect.  DATA:    CT LUMBAR SPINE: I reviewed the CT lumbar spine that was done on 03/07/2017.  This shows degenerative disc disease with dynamic instability L5-S1.  On my review the films I do not see any complicating factors from a vascular standpoint.  There is does not appear to be  significant calcific disease in the iliac arteries.  The L5-S1 is below the confluence of the iliac veins.  MEDICAL ISSUES:   DEGENERATIVE DISC DISEASE L5-S1: The patient appears to be a reasonable candidate for anterior retroperitoneal exposure of L5-S1. I have reviewed our role in exposure of the spine in order to allow anterior lumbar interbody fusion at the appropriate levels. We have discussed the potential complications of surgery, including but not limited to, arterial or venous injury, thrombosis, or bleeding. We have also discussed the potential risks of wound healing problems, the development of a hernia, nerve injury, leg swelling, or other unpredictable medical problems.  Certainly her weight does increase the risk some.  Her BMI is 38.  All the patient's questions were answered and they are agreeable to proceed.  Her surgery is scheduled for 05/15/2017.   Deitra Mayo [facility] [phone]

## 2017-05-09 NOTE — Progress Notes (Signed)
   05/09/17 1232  OBSTRUCTIVE SLEEP APNEA  Have you ever been diagnosed with sleep apnea through a sleep study? No  Do you snore loudly (loud enough to be heard through closed doors)?  1  Do you often feel tired, fatigued, or sleepy during the daytime (such as falling asleep during driving or talking to someone)? 0  Has anyone observed you stop breathing during your sleep? 1  Do you have, or are you being treated for high blood pressure? 1  BMI more than 35 kg/m2? 1  Age > 50 (1-yes) 0  Neck circumference greater than:Female 16 inches or larger, Female 17inches or larger? 1  Female Gender (Yes=1) 0  Obstructive Sleep Apnea Score 5  Score 5 or greater  Results sent to PCP

## 2017-05-09 NOTE — H&P (View-Only) (Signed)
Patient name: Teresa Vincent MRN: [id number] DOB: 01/20/1981 Sex: female  REASON FOR CONSULT:    To evaluate for anterior retroperitoneal exposure of L5-S1.  Consult is requested by Dr. Cyndy Freeze.  HPI:   Teresa Vincent is a pleasant 37 y.o. female, who was referred for evaluation for anterior retroperitoneal exposure of L5-S1.  I have reviewed the records from the referring office.  Patient was seen on 03/30/2017 by Dr. Cyndy Freeze.  Patient has had multiple injections without significant relief.  Patient is tried multiple medications and therapy continues to have significant back pain.  She was felt to be a good candidate for anterior lumbar interbody fusion at L5-S1.  She has a long history of back pain which she has had for many years.  She does not remember any specific injury.  She is a [employer] scientist.  She describes significant pain in both lower extremities which is constant.  She experiences pain with sitting standing and walking.  There are really no alleviating factors.  I do not get any clear-cut history of claudication or rest pain.  Risk factors for peripheral vascular disease include hypertension and tobacco use.  She denies any history of diabetes, hypercholesterolemia, or family history of premature cardiovascular disease.  She has had previous laparoscopic surgery for cholecystectomy and hysterectomy.  Past Medical History:  Diagnosis Date  . Abnormal Pap smear of cervix   . ANXIETY 08/30/2007  . Chronic back pain   . GERD (gastroesophageal reflux disease)   . GRAVES' DISEASE 12/07/2008  . History of kidney stones   . Hypertension   . 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 INSOMNIA 08/30/2007  . MIGRAINE HEADACHE 08/30/2007  . Neck pain   . Palpitations    occasionally couple times a week  . Sciatica   . Stroke [facility]     Family History  Problem Relation Age of Onset  . Hypertension Mother   . Thyroid disease Neg Hx     SOCIAL HISTORY: She had smoked 1  pack/day of cigarettes for many years but is cut back to about a half a pack per day. Social History   Socioeconomic History  . Marital status: Single    Spouse name: Not on file  . Number of children: Not on file  . Years of education: Not on file  . Highest education level: Not on file  Social Needs  . Financial resource strain: Not on file  . Food insecurity - worry: Not on file  . Food insecurity - inability: Not on file  . Transportation needs - medical: Not on file  . Transportation needs - non-medical: Not on file  Occupational History  . Occupation: Doesn't work outside the home  Tobacco Use  . Smoking status: Current Every Day Smoker    Packs/day: 1.00    Years: 26.00    Pack years: 26.00    Types: Cigarettes  . Smokeless tobacco: Never Used  . Tobacco comment: currently 1 pack/3 days  Substance and Sexual Activity  . Alcohol use: Yes    Alcohol/week: 0.0 oz    Comment: occasional  . Drug use: No  . Sexual activity: Not Currently    Birth control/protection: Surgical  Other Topics Concern  . Not on file  Social History Narrative  . Not on file    Allergies  Allergen Reactions  . Adhesive [Tape] Other (See Comments)    Skin irritation, patient prefers paper tape.   Sheliah Hatch [Citalopram Hydrobromide] Other (See Comments)  Make patient too carefree    Current Outpatient Medications  Medication Sig Dispense Refill  . albuterol (PROVENTIL HFA;VENTOLIN HFA) 108 (90 Base) MCG/ACT inhaler Inhale 1-2 puffs into the lungs every 4 (four) hours as needed for wheezing or shortness of breath. 1 Inhaler 3  . ALPRAZolam (XANAX) 0.5 MG tablet TAKE 1 TABLET BY MOUTH AT BEDTIME AS NEEDED FOR  SLEEP (Patient taking differently: TAKE 0.5 MG BY MOUTH AT BEDTIME AS NEEDED FOR SLEEP) 30 tablet 5  . amoxicillin-clavulanate (AUGMENTIN) 875-125 MG tablet Take 1 tablet by mouth 2 (two) times daily. 20 tablet 0  . atorvastatin (LIPITOR) 20 MG tablet Take 1 tablet (20 mg total) by  mouth daily. 30 tablet 6  . cyclobenzaprine (FLEXERIL) 10 MG tablet Take 1 tablet (10 mg total) by mouth 3 (three) times daily as needed for muscle spasms. 30 tablet 3  . gabapentin (NEURONTIN) 300 MG capsule TAKE 1 CAPSULE BY MOUTH IN THE MORNING AND 1 CAPSULE IN THE AFTERNOON AND 2 CAPSULES AT BEDTIME (Patient taking differently: Take 600 mg by mouth 3 (three) times daily. TAKE 2 CAPSULE BY MOUTH IN THE MORNING AND 2 CAPSULE IN THE AFTERNOON AND 2 CAPSULES AT BEDTIME) 120 capsule 5  . ibuprofen (ADVIL,MOTRIN) 200 MG tablet Take 400-800 mg by mouth every 8 (eight) hours as needed (for pain/headaches).    . levothyroxine (SYNTHROID, LEVOTHROID) 150 MCG tablet Take 1 tablet (150 mcg total) by mouth daily before breakfast. 30 tablet 6  . lisinopril (PRINIVIL,ZESTRIL) 10 MG tablet Take 1 tablet (10 mg total) by mouth daily. 30 tablet 6  . ondansetron (ZOFRAN ODT) 4 MG disintegrating tablet Take 1 tablet (4 mg total) every 8 (eight) hours as needed by mouth for nausea or vomiting. 14 tablet 0  . pantoprazole (PROTONIX) 40 MG tablet 1 PO 30 MINUTES PRIOR TO MEALS QD (Patient taking differently: Take 40 mg by mouth daily before breakfast. 30 MINUTES PRIOR TO MEAL) 31 tablet 11  . traMADol (ULTRAM) 50 MG tablet Take 1 tablet (50 mg total) by mouth every 8 (eight) hours as needed. (Patient taking differently: Take 50 mg by mouth every 8 (eight) hours as needed for moderate pain. ) 30 tablet 0  . Na Sulfate-K Sulfate-Mg Sulf (SUPREP BOWEL PREP KIT) 17.5-3.13-1.6 GM/177ML SOLN Take 1 kit by mouth as directed. (Patient not taking: Reported on 05/09/2017) 1 Bottle 0   No current facility-administered medications for this visit.     REVIEW OF SYSTEMS:  [X] denotes positive finding, [ ] denotes negative finding Cardiac  Comments:  Chest pain or chest pressure:    Shortness of breath upon exertion:    Short of breath when lying flat:    Irregular heart rhythm:        Vascular    Pain in calf, thigh, or hip  brought on by ambulation:    Pain in feet at night that wakes you up from your sleep:  x   Blood clot in your veins:    Leg swelling:         Pulmonary    Oxygen at home:    Productive cough:     Wheezing:         Neurologic    Sudden weakness in arms or legs:  x   Sudden numbness in arms or legs:  x   Sudden onset of difficulty speaking or slurred speech:    Temporary loss of vision in one eye:     Problems with dizziness:           Gastrointestinal    Blood in stool:     Vomited blood:         Genitourinary    Burning when urinating:     Blood in urine:        Psychiatric    Major depression:         Hematologic    Bleeding problems:    Problems with blood clotting too easily:        Skin    Rashes or ulcers:        Constitutional    Fever or chills:     PHYSICAL EXAM:   Vitals:   05/09/17 1439  BP: 113/71  Pulse: 78  Resp: 18  SpO2: 96%  Weight: 240 lb (108.9 kg)  Height: 5' 7" (1.702 m)  Body mass index is 37.59 kg/m.   GENERAL: The patient is a well-nourished female, in no acute distress. The vital signs are documented above. CARDIAC: There is a regular rate and rhythm.  VASCULAR: I do not detect carotid bruits. She has palpable femoral and palpable pedal pulses bilaterally. She has no significant lower extremity swelling. PULMONARY: There is good air exchange bilaterally without wheezing or rales. ABDOMEN: Soft and non-tender with normal pitched bowel sounds.  MUSCULOSKELETAL: There are no major deformities or cyanosis. NEUROLOGIC: No focal weakness or paresthesias are detected. SKIN: There are no ulcers or rashes noted. PSYCHIATRIC: The patient has a normal affect.  DATA:    CT LUMBAR SPINE: I reviewed the CT lumbar spine that was done on 03/07/2017.  This shows degenerative disc disease with dynamic instability L5-S1.  On my review the films I do not see any complicating factors from a vascular standpoint.  There is does not appear to be  significant calcific disease in the iliac arteries.  The L5-S1 is below the confluence of the iliac veins.  MEDICAL ISSUES:   DEGENERATIVE DISC DISEASE L5-S1: The patient appears to be a reasonable candidate for anterior retroperitoneal exposure of L5-S1. I have reviewed our role in exposure of the spine in order to allow anterior lumbar interbody fusion at the appropriate levels. We have discussed the potential complications of surgery, including but not limited to, arterial or venous injury, thrombosis, or bleeding. We have also discussed the potential risks of wound healing problems, the development of a hernia, nerve injury, leg swelling, or other unpredictable medical problems.  Certainly her weight does increase the risk some.  Her BMI is 38.  All the patient's questions were answered and they are agreeable to proceed.  Her surgery is scheduled for 05/15/2017.   Deitra Mayo [facility] [phone]

## 2017-05-15 ENCOUNTER — Other Ambulatory Visit: Payer: Self-pay | Admitting: *Deleted

## 2017-05-15 ENCOUNTER — Inpatient Hospital Stay (HOSPITAL_COMMUNITY): Admission: RE | Admit: 2017-05-15 | Payer: Medicaid Other | Source: Ambulatory Visit | Admitting: Neurological Surgery

## 2017-05-15 ENCOUNTER — Encounter (HOSPITAL_COMMUNITY): Admission: RE | Payer: Self-pay | Source: Ambulatory Visit

## 2017-05-15 ENCOUNTER — Other Ambulatory Visit: Payer: Self-pay | Admitting: Neurological Surgery

## 2017-05-15 SURGERY — ANTERIOR LUMBAR FUSION 1 LEVEL
Anesthesia: General

## 2017-05-24 NOTE — Pre-Procedure Instructions (Signed)
Teresa Vincent  05/24/2017      [id number] - [place], [place] - [address] [place] [id number] Phone: [phone] Fax: [phone]    Your procedure is scheduled on March 12  [facility]  Call this number if you have problems the morning of surgery:  [phone]   Remember:  Do not eat food or drink liquids after midnight.  Take these medicines the morning of surgery with A SIP OF WATER : albuterol inhaler if needed, flexeril (cyclobenzaprine), gabapentin (neurontin), levothyroxine (synthroid), zofran if needed, pantoprazole (protonix), tramadol if needed                7 days prior to surgery STOP taking any Aspirin(unless otherwise instructed by your surgeon), Aleve, Naproxen, Ibuprofen, Motrin, Advil, Goody's, BC's, all herbal medications, fish oil, and all vitamins  Do not wear jewelry, make-up or nail polish.  Do not wear lotions, powders, or perfumes, or deodorant.  Do not shave 48 hours prior to surgery.  Men may shave face and neck.  Do not bring valuables to the hospital.  [facility] is not responsible for any belongings or valuables.  Contacts, dentures or bridgework may not be worn into surgery.  Leave your suitcase in the car.  After surgery it may be brought to your room.  For patients admitted to the hospital, discharge time will be determined by your treatment team.  Patients discharged the day of surgery will not be allowed to drive home.   Name and phone number of your driver:  Special instructions:  [facility]- Preparing For Surgery  Before surgery, you can play an important role. Because skin is not sterile, your skin needs to be as free of germs as possible. You can reduce the number of germs on your skin by washing with CHG (chlorahexidine gluconate) Soap before surgery.  CHG is an antiseptic cleaner which kills germs and bonds with the skin to continue killing  germs even after washing.  Please do not use if you have an allergy to CHG or antibacterial soaps. If your skin becomes reddened/irritated stop using the CHG.  Do not shave (including legs and underarms) for at least 48 hours prior to first CHG shower. It is OK to shave your face.  Please follow these instructions carefully.   1. Shower the NIGHT BEFORE SURGERY and the MORNING OF SURGERY with CHG.   2. If you chose to wash your hair, wash your hair first as usual with your normal shampoo.  3. After you shampoo, rinse your hair and body thoroughly to remove the shampoo.  4. Use CHG as you would any other liquid soap. You can apply CHG directly to the skin and wash gently with a scrungie or a clean washcloth.   5. Apply the CHG Soap to your body ONLY FROM THE NECK DOWN.  Do not use on open wounds or open sores. Avoid contact with your eyes, ears, mouth and genitals (private parts). Wash Face and genitals (private parts)  with your normal soap.  6. Wash thoroughly, paying special attention to the area where your surgery will be performed.  7. Thoroughly rinse your body with warm water from the neck down.  8. DO NOT shower/wash with your normal soap after using and rinsing off the CHG Soap.  9. Pat yourself dry with a CLEAN TOWEL.  10. Wear CLEAN PAJAMAS to bed the night before  surgery, wear comfortable clothes the morning of surgery  11. Place CLEAN SHEETS on your bed the night of your first shower and DO NOT SLEEP WITH PETS.    Day of Surgery: Do not apply any deodorants/lotions. Please wear clean clothes to the hospital/surgery center.      Please read over the following fact sheets that you were given. Coughing and Deep Breathing and Surgical Site Infection Prevention

## 2017-05-25 ENCOUNTER — Other Ambulatory Visit: Payer: Self-pay

## 2017-05-25 ENCOUNTER — Encounter (HOSPITAL_COMMUNITY)
Admission: RE | Admit: 2017-05-25 | Discharge: 2017-05-25 | Disposition: A | Discharge status: Home / Self Care | Payer: Medicaid Other | Source: Ambulatory Visit | Attending: Neurological Surgery | Admitting: Neurological Surgery

## 2017-05-25 ENCOUNTER — Encounter (HOSPITAL_COMMUNITY): Payer: Self-pay

## 2017-05-25 DIAGNOSIS — Z01812 Encounter for preprocedural laboratory examination: Secondary | ICD-10-CM | POA: Insufficient documentation

## 2017-05-25 LAB — BASIC METABOLIC PANEL
ANION GAP: 9 (ref 5–15)
BUN: 15 mg/dL (ref 6–20)
CALCIUM: 9.3 mg/dL (ref 8.9–10.3)
CO2: 24 mmol/L (ref 22–32)
CREATININE: 0.77 mg/dL (ref 0.44–1.00)
Chloride: 105 mmol/L (ref 101–111)
Glucose, Bld: 106 mg/dL — ABNORMAL HIGH (ref 65–99)
Potassium: 4.7 mmol/L (ref 3.5–5.1)
SODIUM: 138 mmol/L (ref 135–145)

## 2017-05-25 LAB — CBC
HCT: 43.8 % (ref 36.0–46.0)
Hemoglobin: 14.9 g/dL (ref 12.0–15.0)
MCH: 31.1 pg (ref 26.0–34.0)
MCHC: 34 g/dL (ref 30.0–36.0)
MCV: 91.4 fL (ref 78.0–100.0)
PLATELETS: 241 10*3/uL (ref 150–400)
RBC: 4.79 MIL/uL (ref 3.87–5.11)
RDW: 12.9 % (ref 11.5–15.5)
WBC: 8.9 10*3/uL (ref 4.0–10.5)

## 2017-05-25 LAB — TYPE AND SCREEN
ABO/RH(D): O POS
ANTIBODY SCREEN: NEGATIVE

## 2017-05-25 NOTE — Pre-Procedure Instructions (Signed)
Teresa Vincent  05/25/2017      [facility] - [place], [place] - [address] [place] [id number] Phone: [phone] Fax: [phone]    Your procedure is scheduled on March 12  [facility] Admitting at 5:30 A.M.  Call this number if you have problems the morning of surgery:  [phone]   Remember:  Do not eat food or drink liquids after midnight.  Take these medicines the morning of surgery with A SIP OF WATER :  albuterol inhaler if needed,  flexeril (cyclobenzaprine),  gabapentin (neurontin),  levothyroxine (synthroid),  zofran if needed,  pantoprazole (protonix),  tramadol if needed                7 days prior to surgery STOP taking any Aspirin(unless otherwise instructed by your surgeon), Aleve, Naproxen, Ibuprofen, Motrin, Advil, Goody's, BC's, all herbal medications, fish oil, and all vitamins    Do not wear jewelry, make-up or nail polish.  Do not wear lotions, powders, or perfumes, or deodorant.  Do not shave 48 hours prior to surgery.   Do not bring valuables to the hospital.  [facility] is not responsible for any belongings or valuables.  Hearing aids, eyeglasses, contacts, dentures or bridgework may not be worn into surgery.  Leave your suitcase in the car.  After surgery it may be brought to your room.  For patients admitted to the hospital, discharge time will be determined by your treatment team.  Patients discharged the day of surgery will not be allowed to drive home.   Name and phone number of your driver:  Special instructions:  [facility]- Preparing For Surgery  Before surgery, you can play an important role. Because skin is not sterile, your skin needs to be as free of germs as possible. You can reduce the number of germs on your skin by washing with CHG (chlorahexidine gluconate) Soap before surgery.  CHG is an antiseptic cleaner which kills germs and bonds with the skin to  continue killing germs even after washing.  Please do not use if you have an allergy to CHG or antibacterial soaps. If your skin becomes reddened/irritated stop using the CHG.  Do not shave (including legs and underarms) for at least 48 hours prior to first CHG shower. It is OK to shave your face.  Please follow these instructions carefully.   1. Shower the NIGHT BEFORE SURGERY and the MORNING OF SURGERY with CHG.   2. If you chose to wash your hair, wash your hair first as usual with your normal shampoo.  3. After you shampoo, rinse your hair and body thoroughly to remove the shampoo.  4. Use CHG as you would any other liquid soap. You can apply CHG directly to the skin and wash gently with a scrungie or a clean washcloth.   5. Apply the CHG Soap to your body ONLY FROM THE NECK DOWN.  Do not use on open wounds or open sores. Avoid contact with your eyes, ears, mouth and genitals (private parts). Wash Face and genitals (private parts)  with your normal soap.  6. Wash thoroughly, paying special attention to the area where your surgery will be performed.  7. Thoroughly rinse your body with warm water from the neck down.  8. DO NOT shower/wash with your normal soap after using and rinsing off the CHG Soap.  9. Pat yourself dry with a CLEAN TOWEL.  10. Wear CLEAN  PAJAMAS to bed the night before surgery, wear comfortable clothes the morning of surgery  11. Place CLEAN SHEETS on your bed the night of your first shower and DO NOT SLEEP WITH PETS.    Day of Surgery: Shower as stated above. Do not apply any deodorants/lotions.  Please wear clean clothes to the hospital/surgery center.      Please read over the following fact sheets that you were given.

## 2017-05-25 NOTE — Progress Notes (Signed)
[facility] - [facility] Cardiologist - patient denies  Chest x-ray - 12/11/2016 EKG - 11/27/2016 Stress Test - patient denies  ECHO - patient denies Cardiac Cath - patient denies  Sleep Study - patient denies  Anesthesia review: n/a  Patient denies shortness of breath, fever, cough and chest pain at PAT appointment.  Patient and I did have a conversation regarding her occasional palpitations.  Patient not currently having palpitations but says once she is recovered from surgery, she is going to follow up with her PCP.   Patient verbalized understanding of instructions that were given to them at the PAT appointment. Patient was also instructed that they will need to review over the PAT instructions again at home before surgery.

## 2017-05-28 NOTE — Anesthesia Preprocedure Evaluation (Addendum)
Anesthesia Evaluation  Patient identified by MRN, date of birth, ID band Patient awake    Reviewed: Allergy & Precautions, H&P , NPO status , Patient's Chart, lab work & pertinent test results  Airway Mallampati: II  TM Distance: >3 FB Neck ROM: Full    Dental no notable dental hx. (+) Teeth Intact, Dental Advisory Given   Pulmonary Current Smoker,    Pulmonary exam normal breath sounds clear to auscultation       Cardiovascular Exercise Tolerance: Good hypertension, Pt. on medications  Rhythm:Regular Rate:Normal     Neuro/Psych  Headaches, Anxiety CVA, No Residual Symptoms negative psych ROS   GI/Hepatic Neg liver ROS, GERD  Medicated and Controlled,  Endo/Other  Hypothyroidism Morbid obesity  Renal/GU negative Renal ROS  negative genitourinary   Musculoskeletal   Abdominal   Peds  Hematology negative hematology ROS (+)   Anesthesia Other Findings   Reproductive/Obstetrics negative OB ROS                            Anesthesia Physical Anesthesia Plan  ASA: III  Anesthesia Plan: General   Post-op Pain Management:    Induction: Intravenous  PONV Risk Score and Plan: 3 and Ondansetron, Dexamethasone and Midazolam  Airway Management Planned: Oral ETT  Additional Equipment:   Intra-op Plan:   Post-operative Plan: Extubation in OR  Informed Consent: I have reviewed the patients History and Physical, chart, labs and discussed the procedure including the risks, benefits and alternatives for the proposed anesthesia with the patient or authorized representative who has indicated his/her understanding and acceptance.   Dental advisory given  Plan Discussed with: CRNA  Anesthesia Plan Comments:         Anesthesia Quick Evaluation

## 2017-05-29 ENCOUNTER — Inpatient Hospital Stay (HOSPITAL_COMMUNITY)
Admission: RE | Admit: 2017-05-29 | Discharge: 2017-05-30 | DRG: 460 | Disposition: A | Discharge status: Home / Self Care | Payer: Medicaid Other | Source: Ambulatory Visit | Attending: Neurological Surgery | Admitting: Neurological Surgery

## 2017-05-29 ENCOUNTER — Inpatient Hospital Stay (HOSPITAL_COMMUNITY): Payer: Medicaid Other | Admitting: Anesthesiology

## 2017-05-29 ENCOUNTER — Encounter (HOSPITAL_COMMUNITY): Payer: Self-pay | Admitting: Neurological Surgery

## 2017-05-29 ENCOUNTER — Inpatient Hospital Stay (HOSPITAL_COMMUNITY)
Admission: RE | Disposition: A | Discharge status: Home / Self Care | Payer: Self-pay | Source: Ambulatory Visit | Attending: Neurological Surgery

## 2017-05-29 ENCOUNTER — Inpatient Hospital Stay (HOSPITAL_COMMUNITY): Payer: Medicaid Other

## 2017-05-29 ENCOUNTER — Other Ambulatory Visit: Payer: Self-pay

## 2017-05-29 DIAGNOSIS — Z6838 Body mass index (BMI) 38.0-38.9, adult: Secondary | ICD-10-CM | POA: Diagnosis not present

## 2017-05-29 DIAGNOSIS — Z981 Arthrodesis status: Secondary | ICD-10-CM

## 2017-05-29 DIAGNOSIS — M4317 Spondylolisthesis, lumbosacral region: Secondary | ICD-10-CM | POA: Diagnosis not present

## 2017-05-29 DIAGNOSIS — M5117 Intervertebral disc disorders with radiculopathy, lumbosacral region: Principal | ICD-10-CM | POA: Diagnosis present

## 2017-05-29 DIAGNOSIS — F419 Anxiety disorder, unspecified: Secondary | ICD-10-CM | POA: Diagnosis present

## 2017-05-29 DIAGNOSIS — Z8673 Personal history of transient ischemic attack (TIA), and cerebral infarction without residual deficits: Secondary | ICD-10-CM | POA: Diagnosis not present

## 2017-05-29 DIAGNOSIS — I10 Essential (primary) hypertension: Secondary | ICD-10-CM | POA: Diagnosis present

## 2017-05-29 DIAGNOSIS — E039 Hypothyroidism, unspecified: Secondary | ICD-10-CM | POA: Diagnosis present

## 2017-05-29 DIAGNOSIS — M545 Low back pain: Secondary | ICD-10-CM | POA: Diagnosis present

## 2017-05-29 DIAGNOSIS — K219 Gastro-esophageal reflux disease without esophagitis: Secondary | ICD-10-CM | POA: Diagnosis not present

## 2017-05-29 DIAGNOSIS — Z419 Encounter for procedure for purposes other than remedying health state, unspecified: Secondary | ICD-10-CM

## 2017-05-29 DIAGNOSIS — F17210 Nicotine dependence, cigarettes, uncomplicated: Secondary | ICD-10-CM | POA: Diagnosis not present

## 2017-05-29 DIAGNOSIS — Z79899 Other long term (current) drug therapy: Secondary | ICD-10-CM | POA: Diagnosis not present

## 2017-05-29 DIAGNOSIS — M5137 Other intervertebral disc degeneration, lumbosacral region: Secondary | ICD-10-CM | POA: Diagnosis not present

## 2017-05-29 HISTORY — PX: ABDOMINAL EXPOSURE: SHX5708

## 2017-05-29 HISTORY — PX: ANTERIOR LUMBAR FUSION: SHX1170

## 2017-05-29 SURGERY — ANTERIOR LUMBAR FUSION 1 LEVEL
Anesthesia: General

## 2017-05-29 MED ORDER — FENTANYL CITRATE (PF) 100 MCG/2ML IJ SOLN
INTRAMUSCULAR | Status: DC | PRN
  Administered 2017-05-29: 50 ug via INTRAVENOUS
  Administered 2017-05-29: 100 ug via INTRAVENOUS
  Administered 2017-05-29 (×2): 50 ug via INTRAVENOUS

## 2017-05-29 MED ORDER — MENTHOL 3 MG MT LOZG: 1.0000 | LOZENGE | OROMUCOSAL | Status: DC | PRN

## 2017-05-29 MED ORDER — LACTATED RINGERS IV SOLN
INTRAVENOUS | Status: DC | PRN
  Administered 2017-05-29 (×2): via INTRAVENOUS

## 2017-05-29 MED ORDER — SODIUM CHLORIDE 0.9 % IV SOLN: 250.0000 mL | INTRAVENOUS | Status: DC

## 2017-05-29 MED ORDER — SUGAMMADEX SODIUM 500 MG/5ML IV SOLN
INTRAVENOUS | Status: AC
Start: 2017-05-29 — End: ?
  Filled 2017-05-29: qty 5

## 2017-05-29 MED ORDER — LIDOCAINE HCL (CARDIAC) 20 MG/ML IV SOLN
INTRAVENOUS | Status: AC
  Filled 2017-05-29: qty 5

## 2017-05-29 MED ORDER — DEXAMETHASONE SODIUM PHOSPHATE 10 MG/ML IJ SOLN
INTRAMUSCULAR | Status: AC
  Filled 2017-05-29: qty 1

## 2017-05-29 MED ORDER — HEMOSTATIC AGENTS (NO CHARGE) OPTIME
TOPICAL | Status: DC | PRN
  Administered 2017-05-29: 1 via TOPICAL

## 2017-05-29 MED ORDER — HYDROMORPHONE HCL 1 MG/ML IJ SOLN
INTRAMUSCULAR | Status: AC
  Filled 2017-05-29: qty 1

## 2017-05-29 MED ORDER — THROMBIN (RECOMBINANT) 5000 UNITS EX SOLR
OROMUCOSAL | Status: DC | PRN
  Administered 2017-05-29: 07:00:00 via TOPICAL

## 2017-05-29 MED ORDER — GABAPENTIN 300 MG PO CAPS
600.0000 mg | ORAL_CAPSULE | Freq: Once | ORAL | Status: AC
  Administered 2017-05-29: 600 mg via ORAL

## 2017-05-29 MED ORDER — CELECOXIB 200 MG PO CAPS
200.0000 mg | ORAL_CAPSULE | Freq: Two times a day (BID) | ORAL | Status: DC
  Administered 2017-05-29 – 2017-05-30 (×3): 200 mg via ORAL
  Filled 2017-05-29 (×3): qty 1

## 2017-05-29 MED ORDER — LISINOPRIL 10 MG PO TABS
10.0000 mg | ORAL_TABLET | Freq: Every day | ORAL | Status: DC
Start: 2017-05-29 — End: 2017-05-30
  Administered 2017-05-29 – 2017-05-30 (×2): 10 mg via ORAL
  Filled 2017-05-29 (×2): qty 1

## 2017-05-29 MED ORDER — OXYCODONE HCL 5 MG PO TABS
5.0000 mg | ORAL_TABLET | ORAL | Status: DC | PRN
  Administered 2017-05-29 – 2017-05-30 (×8): 10 mg via ORAL
  Administered 2017-05-30: 5 mg via ORAL
  Filled 2017-05-29 (×9): qty 2

## 2017-05-29 MED ORDER — PHENYLEPHRINE HCL 10 MG/ML IJ SOLN
INTRAVENOUS | Status: DC | PRN
  Administered 2017-05-29: 40 ug/min via INTRAVENOUS

## 2017-05-29 MED ORDER — CYCLOBENZAPRINE HCL 10 MG PO TABS
10.0000 mg | ORAL_TABLET | Freq: Three times a day (TID) | ORAL | Status: DC | PRN
  Administered 2017-05-29 – 2017-05-30 (×5): 10 mg via ORAL
  Filled 2017-05-29 (×4): qty 1

## 2017-05-29 MED ORDER — SODIUM CHLORIDE 0.9% FLUSH
3.0000 mL | Freq: Two times a day (BID) | INTRAVENOUS | Status: DC
  Administered 2017-05-29: 3 mL via INTRAVENOUS

## 2017-05-29 MED ORDER — ACETAMINOPHEN 650 MG RE SUPP: 650.0000 mg | RECTAL | Status: DC | PRN

## 2017-05-29 MED ORDER — THROMBIN 5000 UNITS EX SOLR
CUTANEOUS | Status: AC
  Filled 2017-05-29: qty 5000

## 2017-05-29 MED ORDER — DEXMEDETOMIDINE HCL 200 MCG/2ML IV SOLN
INTRAVENOUS | Status: DC | PRN
  Administered 2017-05-29: 4 ug via INTRAVENOUS
  Administered 2017-05-29 (×3): 12 ug via INTRAVENOUS

## 2017-05-29 MED ORDER — OXYCODONE HCL 5 MG PO TABS
5.0000 mg | ORAL_TABLET | ORAL | Status: DC | PRN
  Administered 2017-05-29: 5 mg via ORAL
  Filled 2017-05-29: qty 1

## 2017-05-29 MED ORDER — ONDANSETRON HCL 4 MG PO TABS
4.0000 mg | ORAL_TABLET | Freq: Four times a day (QID) | ORAL | Status: DC | PRN
  Administered 2017-05-29: 4 mg via ORAL
  Filled 2017-05-29: qty 1

## 2017-05-29 MED ORDER — CHLORHEXIDINE GLUCONATE 4 % EX LIQD: 60.0000 mL | Freq: Once | CUTANEOUS | Status: DC

## 2017-05-29 MED ORDER — CEFAZOLIN SODIUM-DEXTROSE 2-4 GM/100ML-% IV SOLN
2.0000 g | INTRAVENOUS | Status: DC
  Filled 2017-05-29: qty 100

## 2017-05-29 MED ORDER — PHENOL 1.4 % MT LIQD: 1.0000 | OROMUCOSAL | Status: DC | PRN

## 2017-05-29 MED ORDER — FENTANYL CITRATE (PF) 250 MCG/5ML IJ SOLN
INTRAMUSCULAR | Status: AC
  Filled 2017-05-29: qty 5

## 2017-05-29 MED ORDER — MIDAZOLAM HCL 5 MG/5ML IJ SOLN
INTRAMUSCULAR | Status: DC | PRN
  Administered 2017-05-29 (×2): 2 mg via INTRAVENOUS

## 2017-05-29 MED ORDER — ACETAMINOPHEN 500 MG PO TABS
ORAL_TABLET | ORAL | Status: AC
  Administered 2017-05-29: 1000 mg via ORAL
  Filled 2017-05-29: qty 2

## 2017-05-29 MED ORDER — PHENYLEPHRINE 40 MCG/ML (10ML) SYRINGE FOR IV PUSH (FOR BLOOD PRESSURE SUPPORT)
PREFILLED_SYRINGE | INTRAVENOUS | Status: AC
  Filled 2017-05-29: qty 10

## 2017-05-29 MED ORDER — PROPOFOL 10 MG/ML IV BOLUS
INTRAVENOUS | Status: AC
  Filled 2017-05-29: qty 20

## 2017-05-29 MED ORDER — SUGAMMADEX SODIUM 200 MG/2ML IV SOLN
INTRAVENOUS | Status: DC | PRN
  Administered 2017-05-29: 450 mg via INTRAVENOUS

## 2017-05-29 MED ORDER — SODIUM CHLORIDE 0.9% FLUSH: 3.0000 mL | INTRAVENOUS | Status: DC | PRN

## 2017-05-29 MED ORDER — CEFAZOLIN SODIUM-DEXTROSE 2-4 GM/100ML-% IV SOLN
2.0000 g | Freq: Three times a day (TID) | INTRAVENOUS | Status: AC
  Administered 2017-05-29 (×2): 2 g via INTRAVENOUS
  Filled 2017-05-29 (×2): qty 100

## 2017-05-29 MED ORDER — GABAPENTIN 300 MG PO CAPS
ORAL_CAPSULE | ORAL | Status: AC
  Administered 2017-05-29: 600 mg via ORAL
  Filled 2017-05-29: qty 2

## 2017-05-29 MED ORDER — ROCURONIUM BROMIDE 100 MG/10ML IV SOLN
INTRAVENOUS | Status: DC | PRN
  Administered 2017-05-29 (×2): 40 mg via INTRAVENOUS
  Administered 2017-05-29: 60 mg via INTRAVENOUS

## 2017-05-29 MED ORDER — HYDROMORPHONE HCL 1 MG/ML IJ SOLN
0.2500 mg | INTRAMUSCULAR | Status: DC | PRN
  Administered 2017-05-29 (×2): 0.5 mg via INTRAVENOUS

## 2017-05-29 MED ORDER — ARTIFICIAL TEARS OPHTHALMIC OINT
TOPICAL_OINTMENT | OPHTHALMIC | Status: DC | PRN
  Administered 2017-05-29: 1 via OPHTHALMIC

## 2017-05-29 MED ORDER — SENNA 8.6 MG PO TABS
1.0000 | ORAL_TABLET | Freq: Two times a day (BID) | ORAL | Status: DC
  Administered 2017-05-29 – 2017-05-30 (×3): 8.6 mg via ORAL
  Filled 2017-05-29 (×3): qty 1

## 2017-05-29 MED ORDER — GABAPENTIN 300 MG PO CAPS
600.0000 mg | ORAL_CAPSULE | Freq: Every day | ORAL | Status: DC
  Administered 2017-05-29: 600 mg via ORAL
  Filled 2017-05-29: qty 2

## 2017-05-29 MED ORDER — ACETAMINOPHEN 500 MG PO TABS
1000.0000 mg | ORAL_TABLET | Freq: Once | ORAL | Status: AC
  Administered 2017-05-29: 1000 mg via ORAL

## 2017-05-29 MED ORDER — ONDANSETRON HCL 4 MG/2ML IJ SOLN
INTRAMUSCULAR | Status: AC
  Filled 2017-05-29: qty 2

## 2017-05-29 MED ORDER — ROCURONIUM BROMIDE 10 MG/ML (PF) SYRINGE
PREFILLED_SYRINGE | INTRAVENOUS | Status: AC
  Filled 2017-05-29: qty 5

## 2017-05-29 MED ORDER — OXYCODONE HCL 5 MG PO TABS
ORAL_TABLET | ORAL | Status: AC
  Filled 2017-05-29: qty 1

## 2017-05-29 MED ORDER — PANTOPRAZOLE SODIUM 40 MG PO TBEC
40.0000 mg | DELAYED_RELEASE_TABLET | Freq: Every day | ORAL | Status: DC
  Administered 2017-05-30: 40 mg via ORAL
  Filled 2017-05-29: qty 1

## 2017-05-29 MED ORDER — GABAPENTIN 300 MG PO CAPS
300.0000 mg | ORAL_CAPSULE | Freq: Two times a day (BID) | ORAL | Status: DC
  Filled 2017-05-29: qty 1

## 2017-05-29 MED ORDER — PROPOFOL 10 MG/ML IV BOLUS
INTRAVENOUS | Status: DC | PRN
  Administered 2017-05-29: 280 mg via INTRAVENOUS

## 2017-05-29 MED ORDER — HYDROMORPHONE HCL 1 MG/ML IJ SOLN
0.5000 mg | INTRAMUSCULAR | Status: DC | PRN
  Administered 2017-05-29: 0.5 mg via INTRAVENOUS
  Filled 2017-05-29: qty 0.5

## 2017-05-29 MED ORDER — SODIUM CHLORIDE 0.9 % IR SOLN
Status: DC | PRN
  Administered 2017-05-29: 07:00:00

## 2017-05-29 MED ORDER — ALBUTEROL SULFATE (2.5 MG/3ML) 0.083% IN NEBU: 2.5000 mg | INHALATION_SOLUTION | RESPIRATORY_TRACT | Status: DC | PRN

## 2017-05-29 MED ORDER — LEVOTHYROXINE SODIUM 75 MCG PO TABS
150.0000 ug | ORAL_TABLET | Freq: Every day | ORAL | Status: DC
  Administered 2017-05-30: 150 ug via ORAL
  Filled 2017-05-29: qty 2

## 2017-05-29 MED ORDER — CHLORHEXIDINE GLUCONATE CLOTH 2 % EX PADS: 6.0000 | MEDICATED_PAD | Freq: Once | CUTANEOUS | Status: DC

## 2017-05-29 MED ORDER — MIDAZOLAM HCL 2 MG/2ML IJ SOLN
INTRAMUSCULAR | Status: AC
  Filled 2017-05-29: qty 2

## 2017-05-29 MED ORDER — NICOTINE 21 MG/24HR TD PT24
21.0000 mg | MEDICATED_PATCH | Freq: Every day | TRANSDERMAL | Status: DC
  Administered 2017-05-29 – 2017-05-30 (×2): 21 mg via TRANSDERMAL
  Filled 2017-05-29 (×2): qty 1

## 2017-05-29 MED ORDER — ONDANSETRON HCL 4 MG/2ML IJ SOLN: 4.0000 mg | Freq: Four times a day (QID) | INTRAMUSCULAR | Status: DC | PRN

## 2017-05-29 MED ORDER — DEXAMETHASONE SODIUM PHOSPHATE 10 MG/ML IJ SOLN
10.0000 mg | INTRAMUSCULAR | Status: AC
  Administered 2017-05-29: 10 mg via INTRAVENOUS
  Filled 2017-05-29: qty 1

## 2017-05-29 MED ORDER — 0.9 % SODIUM CHLORIDE (POUR BTL) OPTIME
TOPICAL | Status: DC | PRN
  Administered 2017-05-29: 1000 mL

## 2017-05-29 MED ORDER — POTASSIUM CHLORIDE IN NACL 20-0.9 MEQ/L-% IV SOLN: INTRAVENOUS | Status: DC

## 2017-05-29 MED ORDER — ACETAMINOPHEN 325 MG PO TABS: 650.0000 mg | ORAL_TABLET | ORAL | Status: DC | PRN

## 2017-05-29 MED ORDER — LIDOCAINE HCL (CARDIAC) 20 MG/ML IV SOLN
INTRAVENOUS | Status: DC | PRN
  Administered 2017-05-29: 60 mg via INTRAVENOUS

## 2017-05-29 MED ORDER — CYCLOBENZAPRINE HCL 10 MG PO TABS
ORAL_TABLET | ORAL | Status: AC
  Filled 2017-05-29: qty 1

## 2017-05-29 SURGICAL SUPPLY — 91 items
ADH SKN CLS APL DERMABOND .7 (GAUZE/BANDAGES/DRESSINGS) ×1
ANCH SPNL 27 LMBR MIS (Anchor) ×3 IMPLANT
ANCHOR LUMBAR 27 (Anchor) ×3 IMPLANT
ANCHOR LUMBAR 27MM (Anchor) ×3 IMPLANT
APPLIER CLIP 11 MED OPEN (CLIP) ×3
APR CLP MED 11 20 MLT OPN (CLIP) ×1
BAG DECANTER FOR FLEXI CONT (MISCELLANEOUS) ×3 IMPLANT
BONE VIVIGEN FORMABLE 5.4CC (Bone Implant) ×3 IMPLANT
BUR BARREL STRAIGHT FLUTE 4.0 (BURR) IMPLANT
BUR MATCHSTICK NEURO 3.0 LAGG (BURR) ×2 IMPLANT
CANISTER SUCT 3000ML PPV (MISCELLANEOUS) ×3 IMPLANT
CARTRIDGE OIL MAESTRO DRILL (MISCELLANEOUS) ×1 IMPLANT
CLIP APPLIE 11 MED OPEN (CLIP) ×1 IMPLANT
COVER BACK TABLE 60X90IN (DRAPES) ×3 IMPLANT
DERMABOND ADVANCED (GAUZE/BANDAGES/DRESSINGS) ×2
DERMABOND ADVANCED .7 DNX12 (GAUZE/BANDAGES/DRESSINGS) ×1 IMPLANT
DIFFUSER DRILL AIR PNEUMATIC (MISCELLANEOUS) ×3 IMPLANT
DRAPE C-ARM 42X72 X-RAY (DRAPES) ×7 IMPLANT
DRAPE INCISE IOBAN 66X45 STRL (DRAPES) ×3 IMPLANT
DRAPE LAPAROTOMY 100X72X124 (DRAPES) ×3 IMPLANT
DRAPE POUCH INSTRU U-SHP 10X18 (DRAPES) ×1 IMPLANT
DRSG OPSITE POSTOP 4X8 (GAUZE/BANDAGES/DRESSINGS) ×2 IMPLANT
DURAPREP 26ML APPLICATOR (WOUND CARE) ×3 IMPLANT
ELECT BLADE 4.0 EZ CLEAN MEGAD (MISCELLANEOUS) ×3
ELECT BLADE 6.5 EXT (BLADE) ×2 IMPLANT
ELECT REM PT RETURN 9FT ADLT (ELECTROSURGICAL) ×3
ELECTRODE BLDE 4.0 EZ CLN MEGD (MISCELLANEOUS) ×1 IMPLANT
ELECTRODE REM PT RTRN 9FT ADLT (ELECTROSURGICAL) ×1 IMPLANT
GAUZE SPONGE 4X4 12PLY STRL (GAUZE/BANDAGES/DRESSINGS) ×3 IMPLANT
GAUZE SPONGE 4X4 16PLY XRAY LF (GAUZE/BANDAGES/DRESSINGS) IMPLANT
GLOVE BIO SURGEON STRL SZ8 (GLOVE) ×6 IMPLANT
GLOVE BIOGEL PI IND STRL 6.5 (GLOVE) IMPLANT
GLOVE BIOGEL PI IND STRL 7.0 (GLOVE) IMPLANT
GLOVE BIOGEL PI IND STRL 8 (GLOVE) ×1 IMPLANT
GLOVE BIOGEL PI INDICATOR 6.5 (GLOVE) ×2
GLOVE BIOGEL PI INDICATOR 7.0 (GLOVE) ×2
GLOVE BIOGEL PI INDICATOR 8 (GLOVE) ×2
GLOVE ECLIPSE 7.5 STRL STRAW (GLOVE) ×3 IMPLANT
GLOVE SS N UNI LF 7.5 STRL (GLOVE) IMPLANT
GLOVE SURG SS PI 6.5 STRL IVOR (GLOVE) ×6 IMPLANT
GOWN STRL REUS W/ TWL LRG LVL3 (GOWN DISPOSABLE) ×3 IMPLANT
GOWN STRL REUS W/ TWL XL LVL3 (GOWN DISPOSABLE) IMPLANT
GOWN STRL REUS W/TWL 2XL LVL3 (GOWN DISPOSABLE) ×3 IMPLANT
GOWN STRL REUS W/TWL LRG LVL3 (GOWN DISPOSABLE) ×12
GOWN STRL REUS W/TWL XL LVL3 (GOWN DISPOSABLE) ×6
GRAFT BNE MATRIX VG FRMBL MD 5 (Bone Implant) IMPLANT
HEMOSTAT POWDER KIT SURGIFOAM (HEMOSTASIS) ×2 IMPLANT
INSERT FOGARTY 61MM (MISCELLANEOUS) IMPLANT
INSERT FOGARTY SM (MISCELLANEOUS) IMPLANT
KIT BASIN OR (CUSTOM PROCEDURE TRAY) ×3 IMPLANT
KIT INFUSE X SMALL 1.4CC (Orthopedic Implant) ×2 IMPLANT
KIT ROOM TURNOVER OR (KITS) ×6 IMPLANT
LOOP VESSEL MAXI BLUE (MISCELLANEOUS) ×4 IMPLANT
LOOP VESSEL MINI RED (MISCELLANEOUS) IMPLANT
NDL SPNL 18GX3.5 QUINCKE PK (NEEDLE) ×1 IMPLANT
NEEDLE SPNL 18GX3.5 QUINCKE PK (NEEDLE) ×3 IMPLANT
NS IRRIG 1000ML POUR BTL (IV SOLUTION) ×5 IMPLANT
OIL CARTRIDGE MAESTRO DRILL (MISCELLANEOUS) ×3
PACK LAMINECTOMY NEURO (CUSTOM PROCEDURE TRAY) ×3 IMPLANT
PAD ARMBOARD 7.5X6 YLW CONV (MISCELLANEOUS) ×6 IMPLANT
SPACER INDEPEND 26X34 15D (Spacer) ×2 IMPLANT
SPONGE LAP 18X18 X RAY DECT (DISPOSABLE) ×3 IMPLANT
SPONGE LAP 4X18 X RAY DECT (DISPOSABLE) IMPLANT
SPONGE SURGIFOAM ABS GEL 100 (HEMOSTASIS) ×2 IMPLANT
STAPLER VISISTAT (STAPLE) IMPLANT
SUT PROLENE 4 0 RB 1 (SUTURE)
SUT PROLENE 4-0 RB1 .5 CRCL 36 (SUTURE) IMPLANT
SUT PROLENE 5 0 CC1 (SUTURE) IMPLANT
SUT PROLENE 6 0 C 1 30 (SUTURE) ×1 IMPLANT
SUT PROLENE 6 0 CC (SUTURE) IMPLANT
SUT SILK 0 TIES 10X30 (SUTURE) ×3 IMPLANT
SUT SILK 2 0 TIES 10X30 (SUTURE) ×4 IMPLANT
SUT SILK 2 0SH CR/8 30 (SUTURE) IMPLANT
SUT SILK 3 0 TIES 10X30 (SUTURE) ×3 IMPLANT
SUT SILK 3 0 TIES 17X18 (SUTURE)
SUT SILK 3 0SH CR/8 30 (SUTURE) IMPLANT
SUT SILK 3-0 18XBRD TIE BLK (SUTURE) IMPLANT
SUT VIC AB 0 CT1 18XCR BRD8 (SUTURE) ×1 IMPLANT
SUT VIC AB 0 CT1 27 (SUTURE) ×6
SUT VIC AB 0 CT1 27XBRD ANBCTR (SUTURE) ×3 IMPLANT
SUT VIC AB 0 CT1 8-18 (SUTURE) ×3
SUT VIC AB 2-0 CP2 18 (SUTURE) ×3 IMPLANT
SUT VIC AB 2-0 CTB1 (SUTURE) ×3 IMPLANT
SUT VIC AB 3-0 SH 27 (SUTURE) ×3
SUT VIC AB 3-0 SH 27X BRD (SUTURE) ×1 IMPLANT
SUT VIC AB 3-0 SH 8-18 (SUTURE) ×5 IMPLANT
SUT VICRYL 4-0 PS2 18IN ABS (SUTURE) ×3 IMPLANT
TOWEL GREEN STERILE (TOWEL DISPOSABLE) ×6 IMPLANT
TOWEL GREEN STERILE FF (TOWEL DISPOSABLE) ×9 IMPLANT
TRAY FOLEY W/METER SILVER 16FR (SET/KITS/TRAYS/PACK) ×3 IMPLANT
WATER STERILE IRR 1000ML POUR (IV SOLUTION) ×3 IMPLANT

## 2017-05-29 NOTE — Op Note (Signed)
    NAME: Teresa Vincent    MRN: [id number] DOB: 12/28/1980    DATE OF OPERATION: 05/29/2017  PREOP DIAGNOSIS:    Degenerative disc disease L5-S1  POSTOP DIAGNOSIS:    Same   PROCEDURE:    Anterior retroperitoneal exposure of L5-S1  EXPOSURE SURGEON: Di Kindlehristopher S. Edilia Boickson, MD  SPINE SURGEON: Marikay Alaravid Jones MD  ANESTHESIA: General  EBL: Minimal  INDICATIONS:    Teresa LanceCarolyn J Kazlauskas is a 37 y.o. female who presents with significant degenerative disc disease at L5-S1.  I was asked to provide anterior retroperitoneal exposure.  FINDINGS:   No significant calcific disease in left common and external iliac artery.  TECHNIQUE:   The patient was taken to the operating room and received a general anesthetic.  A pulse ox was placed on the left great toe.  The level of the L5-S1 disc was marked under fluoroscopy.  The abdomen was prepped and draped in usual sterile fashion.  An incision was made at the proposed level and the dissection carried down to the anterior rectus sheath.  The anterior rectus sheath was divided transversely.  This was extended medially past the linea alba over to the right rectus abdominis muscle.  Laterally this was extended to the linea semilunaris.  The anterior rectus sheath was mobilized superiorly and inferiorly.  The muscle was freed up circumferentially.  The muscle was retracted medially.  The retroperitoneal space was entered laterally.  The dissection was carried down past the psoas.  The left external iliac artery was identified.  Using blunt dissection the dissection was continued superiorly medially up to the confluence of the iliac veins and left common iliac artery.  The middle sacral vessels were cauterized and divided.  The L5-S1 disc space was bluntly dissected free.  A reverse lip retractor was placed initially on the right side of the vertebral body and secured with the [facility] retractor system.  A second reverse lip retractor was placed on the left  side of the vertebral body and secured.  Retractors were placed superiorly and inferiorly.  A needle was placed and the correct level was confirmed under fluoroscopy.  The remainder of the dictation is as per Dr. Yetta BarreJones.  Waverly Ferrarihristopher Tarry Blayney, MD, FACS [facility]  DATE OF DICTATION:   05/29/2017

## 2017-05-29 NOTE — Evaluation (Signed)
Physical Therapy Evaluation Patient Details Name: Teresa LanceCarolyn J Vincent MRN: [id number] DOB: 03/14/1981 Today's Date: 05/29/2017   History of Present Illness  37 yo female with onset of back pain and received ALIF for L5-S1 lytic spondylolisthesis with DDD.  PMHx:  graves disease, hypothyroidism, migraines, neck pain, stroke, anxiety, kidney stones,   Clinical Impression  Pt was seen for evaluation of mobility and covered body mechanics for walking, bed mob and transfers.  Her brace was donned and doffed, and reviewed handout with husband coming in during the session.  Willl need to get up a flight of steps tomorrow and then should be ready to go home as MD orders.      Follow Up Recommendations No PT follow up    Equipment Recommendations  Rolling walker with 5" wheels(if hers is not in good shape)    Recommendations for Other Services       Precautions / Restrictions Precautions Precautions: Back Precaution Booklet Issued: Yes (comment) Precaution Comments: reviewed with mobility Required Braces or Orthoses: Spinal Brace Spinal Brace: Applied in sitting position Restrictions Weight Bearing Restrictions: No      Mobility  Bed Mobility Overal bed mobility: Needs Assistance Bed Mobility: Supine to Sit;Sit to Supine     Supine to sit: Mod assist Sit to supine: Mod assist   General bed mobility comments: assisted to pivot sitting and lifting legs to get to bed  Transfers Overall transfer level: Needs assistance Equipment used: Rolling walker (2 wheeled);1 person hand held assist Transfers: Sit to/from Stand Sit to Stand: Mod assist         General transfer comment: mod assist to power up with reminders for hand placement  Ambulation/Gait Ambulation/Gait assistance: Min guard Ambulation Distance (Feet): 100 Feet Assistive device: Rolling walker (2 wheeled) Gait Pattern/deviations: Step-through pattern;Decreased stance time - left;Antalgic;Wide base of support Gait  velocity: reduced Gait velocity interpretation: Below normal speed for age/gender General Gait Details: cued for pace and posture  Stairs            Wheelchair Mobility    Modified Rankin (Stroke Patients Only)       Balance Overall balance assessment: Needs assistance Sitting-balance support: Feet supported Sitting balance-Leahy Scale: Fair     Standing balance support: Bilateral upper extremity supported;During functional activity Standing balance-Leahy Scale: Poor                               Pertinent Vitals/Pain Pain Assessment: 0-10 Pain Score: 10-Worst pain ever Pain Location: back and legs esp LLE Pain Descriptors / Indicators: Aching;Operative site guarding Pain Intervention(s): Limited activity within patient's tolerance;Monitored during session;Premedicated before session;Repositioned;Ice applied    Home Living Family/patient expects to be discharged to:: Private residence Living Arrangements: Spouse/significant other;Children;Other relatives Available Help at Discharge: Family Type of Home: House Home Access: Stairs to enter Entrance Stairs-Rails: Left Entrance Stairs-Number of Steps: 2 Home Layout: Two level Home Equipment: Environmental consultantWalker - 2 wheels      Prior Function Level of Independence: Independent with assistive device(s)               Hand Dominance   Dominant Hand: Right    Extremity/Trunk Assessment   Upper Extremity Assessment Upper Extremity Assessment: Overall WFL for tasks assessed    Lower Extremity Assessment Lower Extremity Assessment: Generalized weakness    Cervical / Trunk Assessment Cervical / Trunk Assessment: Other exceptions(new ALIF)  Communication   Communication: No difficulties  Cognition Arousal/Alertness:  Awake/alert Behavior During Therapy: WFL for tasks assessed/performed Overall Cognitive Status: Within Functional Limits for tasks assessed                                         General Comments General comments (skin integrity, edema, etc.): tends to get shaky knees during gait, pacing encouraged    Exercises     Assessment/Plan    PT Assessment Patient needs continued PT services  PT Problem List Decreased strength;Decreased range of motion;Decreased activity tolerance;Decreased balance;Decreased mobility;Decreased coordination;Decreased safety awareness;Decreased skin integrity;Obesity;Pain       PT Treatment Interventions DME instruction;Stair training;Gait training;Functional mobility training;Therapeutic activities;Therapeutic exercise;Balance training;Neuromuscular re-education;Patient/family education    PT Goals (Current goals can be found in the Care Plan section)  Acute Rehab PT Goals Patient Stated Goal: to get pain managed PT Goal Formulation: With patient/family Time For Goal Achievement: 06/12/17 Potential to Achieve Goals: Good    Frequency Min 4X/week   Barriers to discharge Inaccessible home environment stairs to get upstairs    Co-evaluation               AM-PAC PT "6 Clicks" Daily Activity  Outcome Measure Difficulty turning over in bed (including adjusting bedclothes, sheets and blankets)?: Unable Difficulty moving from lying on back to sitting on the side of the bed? : Unable Difficulty sitting down on and standing up from a chair with arms (e.g., wheelchair, bedside commode, etc,.)?: Unable Help needed moving to and from a bed to chair (including a wheelchair)?: A Lot Help needed walking in hospital room?: A Little Help needed climbing 3-5 steps with a railing? : A Lot 6 Click Score: 10    End of Session Equipment Utilized During Treatment: Gait belt Activity Tolerance: Patient limited by pain Patient left: in bed;with call bell/phone within reach;with family/visitor present;with nursing/sitter in room Nurse Communication: Mobility status PT Visit Diagnosis: Unsteadiness on feet (R26.81);Pain;Muscle weakness  (generalized) (M62.81) Pain - Right/Left: Left Pain - part of body: Hip(back)    Time: 1610-9604 PT Time Calculation (min) (ACUTE ONLY): 44 min   Charges:   PT Evaluation $PT Eval Moderate Complexity: 1 Mod PT Treatments $Gait Training: 8-22 mins $Therapeutic Exercise: 8-22 mins   PT G Codes:   PT G-Codes **NOT FOR INPATIENT CLASS** Functional Assessment Tool Used: AM-PAC 6 Clicks Basic Mobility    Ivar Drape 05/29/2017, 6:22 PM   Samul Dada, PT MS Acute Rehab Dept. Number: [facility] [id number] and [facility] [phone]

## 2017-05-29 NOTE — Anesthesia Postprocedure Evaluation (Signed)
Anesthesia Post Note  Patient: Teresa Vincent  Procedure(s) Performed: Anterior Lumbar Interbody Fusion  - Lumbar five sacral one (N/A ) ABDOMINAL EXPOSURE (N/A )     Patient location during evaluation: PACU Anesthesia Type: General Level of consciousness: awake and alert Pain management: pain level controlled Vital Signs Assessment: post-procedure vital signs reviewed and stable Respiratory status: spontaneous breathing, nonlabored ventilation and respiratory function stable Cardiovascular status: blood pressure returned to baseline and stable Postop Assessment: no apparent nausea or vomiting Anesthetic complications: no    Last Vitals:  Vitals:   05/29/17 1200 05/29/17 1234  BP: 124/68 122/72  Pulse: 72 70  Resp: 16 18  Temp: (!) 36.1 C 36.7 C  SpO2: 100% 99%    Last Pain:  Vitals:   05/29/17 1250  TempSrc:   PainSc: 10-Worst pain ever                 Ashe Graybeal,W. EDMOND

## 2017-05-29 NOTE — Progress Notes (Signed)
Dr Yetta BarreJones here and aware pt c/o foley bothering her-order to DC cath.in PACU.

## 2017-05-29 NOTE — Plan of Care (Signed)
  Progressing Safety: Ability to remain free from injury will improve 05/29/2017 2301 - Progressing by Mel AlmondAguilar, Layton Naves D, RN Activity: Ability to avoid complications of mobility impairment will improve 05/29/2017 2301 - Progressing by Mel AlmondAguilar, Beyounce Dickens D, RN Ability to tolerate increased activity will improve 05/29/2017 2301 - Progressing by Mel AlmondAguilar, Aretha Levi D, RN Will remain free from falls 05/29/2017 2301 - Progressing by Threasa BeardsAguilar, Angelin Cutrone D, RN Bowel/Gastric: Gastrointestinal status for postoperative course will improve 05/29/2017 2301 - Progressing by Mel AlmondAguilar, Brentin Shin D, RN Education: Ability to verbalize activity precautions or restrictions will improve 05/29/2017 2301 - Progressing by Mel AlmondAguilar, Estefano Victory D, RN Knowledge of the prescribed therapeutic regimen will improve 05/29/2017 2301 - Progressing by Mel AlmondAguilar, Panhia Karl D, RN Understanding of discharge needs will improve 05/29/2017 2301 - Progressing by Mel AlmondAguilar, Zenab Gronewold D, RN Physical Regulation: Ability to maintain clinical measurements within normal limits will improve 05/29/2017 2301 - Progressing by Mel AlmondAguilar, Evona Westra D, RN Postoperative complications will be avoided or minimized 05/29/2017 2301 - Progressing by Mel AlmondAguilar, Ramia Sidney D, RN Diagnostic test results will improve 05/29/2017 2301 - Progressing by Mel AlmondAguilar, Janeisha Ryle D, RN Pain Management: Pain level will decrease 05/29/2017 2301 - Progressing by Mel AlmondAguilar, Moya Duan D, RN Skin Integrity: Signs of wound healing will improve 05/29/2017 2301 - Progressing by Threasa BeardsAguilar, Misael Mcgaha D, RN Health Behavior/Discharge Planning: Identification of resources available to assist in meeting health care needs will improve 05/29/2017 2301 - Progressing by Mel AlmondAguilar, Abygale Karpf D, RN Bladder/Genitourinary: Urinary functional status for postoperative course will improve 05/29/2017 2301 - Progressing by Mel AlmondAguilar, Laquenta Whitsell D, RN

## 2017-05-29 NOTE — H&P (Signed)
Subjective: Patient is a 37 y.o. female admitted for ALIF. Onset of symptoms was several months ago, gradually worsening since that time.  The pain is rated severe, and is located at the across the lower back and radiates to legs. The pain is described as aching and occurs all day. The symptoms have been progressive. Symptoms are exacerbated by nothing in particular. MRI or CT showed lytic spondy L5-S1   Past Medical History:  Diagnosis Date  . Abnormal Pap smear of cervix   . ANXIETY 08/30/2007  . Chronic back pain   . GERD (gastroesophageal reflux disease)   . GRAVES' DISEASE 12/07/2008  . History of kidney stones   . Hypertension   . 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 INSOMNIA 08/30/2007  . MIGRAINE HEADACHE 08/30/2007  . Neck pain   . Palpitations    occasionally couple times a week  . Sciatica   . Stroke [facility])     Past Surgical History:  Procedure Laterality Date  . CARPAL TUNNEL RELEASE Bilateral   . CHOLECYSTECTOMY    . CHOLECYSTECTOMY N/A 12/29/2016   Procedure: LAPAROSCOPIC CHOLECYSTECTOMY;  Surgeon: [name], MD;  Location: [facility] ORS;  Service: General;  Laterality: N/A;  . LUMBAR EPIDURAL INJECTION    . STERIOD INJECTION N/A 01/10/2017   Procedure: MINOR EXPAREL INJECTION;  Surgeon: [name], MD;  Location: [facility];  Service: General;  Laterality: N/A;  . TOTAL ABDOMINAL HYSTERECTOMY W/ BILATERAL SALPINGOOPHORECTOMY N/A   . TUBAL LIGATION  2003    Prior to Admission medications   Medication Sig Start Date End Date Taking? Authorizing Provider  albuterol (PROVENTIL HFA;VENTOLIN HFA) 108 (90 Base) MCG/ACT inhaler Inhale 1-2 puffs into the lungs every 4 (four) hours as needed for wheezing or shortness of breath. 12/13/16  Yes [name], MD  ALPRAZolam Prudy Feeler) 0.5 MG tablet TAKE 1 TABLET BY MOUTH AT BEDTIME AS NEEDED FOR  SLEEP Patient taking differently: TAKE 0.5 MG BY MOUTH AT BEDTIME AS NEEDED FOR SLEEP 11/21/16  Yes [name], MD   atorvastatin (LIPITOR) 20 MG tablet Take 1 tablet (20 mg total) by mouth daily. 04/27/17  Yes Nida, Denman George, MD  cyclobenzaprine (FLEXERIL) 10 MG tablet Take 1 tablet (10 mg total) by mouth 3 (three) times daily as needed for muscle spasms. 12/13/16  Yes [name], MD  gabapentin (NEURONTIN) 300 MG capsule TAKE 1 CAPSULE BY MOUTH IN THE MORNING AND 1 CAPSULE IN THE AFTERNOON AND 2 CAPSULES AT BEDTIME Patient taking differently: Take 600 mg by mouth 3 (three) times daily.  02/16/17  Yes Campbell Riches, NP  levothyroxine (SYNTHROID, LEVOTHROID) 150 MCG tablet Take 1 tablet (150 mcg total) by mouth daily before breakfast. 04/27/17  Yes Nida, Denman George, MD  lisinopril (PRINIVIL,ZESTRIL) 10 MG tablet Take 1 tablet (10 mg total) by mouth daily. 04/27/17  Yes Roma Kayser, MD  ondansetron (ZOFRAN ODT) 4 MG disintegrating tablet Take 1 tablet (4 mg total) every 8 (eight) hours as needed by mouth for nausea or vomiting. 01/25/17  Yes [name], MD  oxyCODONE (OXY IR/ROXICODONE) 5 MG immediate release tablet Take 2.5-5 mg by mouth every 4 (four) hours as needed (pain).    Yes [provider]  pantoprazole (PROTONIX) 40 MG tablet 1 PO 30 MINUTES PRIOR TO MEALS QD Patient taking differently: Take 40 mg by mouth daily before breakfast. 30 MINUTES PRIOR TO MEAL 03/29/17  Yes Fields, Sandi L, MD  traMADol (ULTRAM) 50 MG tablet Take 1  tablet (50 mg total) by mouth every 8 (eight) hours as needed. Patient taking differently: Take 50 mg by mouth every 8 (eight) hours as needed for moderate pain.  11/27/16  Yes Campbell Riches, NP   Allergies  Allergen Reactions  . Adhesive [Tape] Other (See Comments)    Skin irritation, patient prefers paper tape.   Budd Palmer [Citalopram Hydrobromide] Other (See Comments)    Make patient too carefree    Social History   Tobacco Use  . Smoking status: Current Every Day Smoker    Packs/day: 1.00    Years: 26.00    Pack years:  26.00    Types: Cigarettes  . Smokeless tobacco: Never Used  . Tobacco comment: currently 1 pack or less  Substance Use Topics  . Alcohol use: Yes    Alcohol/week: 0.0 oz    Comment: occasional    Family History  Problem Relation Age of Onset  . Hypertension Mother   . Thyroid disease Neg Hx      Review of Systems  Positive ROS: neg  All other systems have been reviewed and were otherwise negative with the exception of those mentioned in the HPI and as above.  Objective: Vital signs in last 24 hours:    General Appearance: Alert, cooperative, no distress, appears stated age Head: Normocephalic, without obvious abnormality, atraumatic Eyes: PERRL, conjunctiva/corneas clear, EOM's intact    Neck: Supple, symmetrical, trachea midline Back: Symmetric, no curvature, ROM normal, no CVA tenderness Lungs:  respirations unlabored Heart: Regular rate and rhythm Abdomen: Soft, non-tender Extremities: Extremities normal, atraumatic, no cyanosis or edema Pulses: 2+ and symmetric all extremities Skin: Skin color, texture, turgor normal, no rashes or lesions  NEUROLOGIC:   Mental status: Alert and oriented x4,  no aphasia, good attention span, fund of knowledge, and memory Motor Exam - grossly normal Sensory Exam - grossly normal Reflexes: 1+ Coordination - grossly normal Gait - grossly normal Balance - grossly normal Cranial Nerves: I: smell Not tested  II: visual acuity  OS: nl    OD: nl  II: visual fields Full to confrontation  II: pupils Equal, round, reactive to light  III,VII: ptosis None  III,IV,VI: extraocular muscles  Full ROM  V: mastication Normal  V: facial light touch sensation  Normal  V,VII: corneal reflex  Present  VII: facial muscle function - upper  Normal  VII: facial muscle function - lower Normal  VIII: hearing Not tested  IX: soft palate elevation  Normal  IX,X: gag reflex Present  XI: trapezius strength  5/5  XI: sternocleidomastoid strength 5/5   XI: neck flexion strength  5/5  XII: tongue strength  Normal    Data Review Lab Results  Component Value Date   WBC 8.9 05/25/2017   HGB 14.9 05/25/2017   HCT 43.8 05/25/2017   MCV 91.4 05/25/2017   PLT 241 05/25/2017   Lab Results  Component Value Date   NA 138 05/25/2017   K 4.7 05/25/2017   CL 105 05/25/2017   CO2 24 05/25/2017   BUN 15 05/25/2017   CREATININE 0.77 05/25/2017   GLUCOSE 106 (H) 05/25/2017   No results found for: INR, PROTIME  Assessment/Plan:  Estimated body mass index is 37.57 kg/m as calculated from the following:   Height as of 05/25/17: 5\' 7"  (1.702 m).   Weight as of 05/25/17: 108.8 kg (239 lb 14.4 oz). Patient admitted for ALIF L5-S1. Patient has failed a reasonable attempt at conservative therapy.  Morbid obesity  I explained the condition and procedure to the patient and answered any questions.  Patient wishes to proceed with procedure as planned. Understands risks/ benefits and typical outcomes of procedure.   JONES,DAVID S 05/29/2017 6:05 AM

## 2017-05-29 NOTE — Transfer of Care (Signed)
Immediate Anesthesia Transfer of Care Note  Patient: Teresa Vincent  Procedure(s) Performed: Anterior Lumbar Interbody Fusion  - Lumbar five sacral one (N/A ) ABDOMINAL EXPOSURE (N/A )  Patient Location: PACU  Anesthesia Type:General  Level of Consciousness: awake, alert  and oriented  Airway & Oxygen Therapy: Patient Spontanous Breathing and Patient connected to nasal cannula oxygen  Post-op Assessment: Report given to RN and Post -op Vital signs reviewed and stable  Post vital signs: Reviewed and stable  Last Vitals:  Vitals:   05/29/17 0617 05/29/17 1100  BP: 133/80   Pulse:    Resp:    Temp:  36.7 C  SpO2:      Last Pain:  Vitals:   05/29/17 0628  TempSrc:   PainSc: 7       Patients Stated Pain Goal: 4 (05/29/17 [id number])  Complications: No apparent anesthesia complications

## 2017-05-29 NOTE — Anesthesia Procedure Notes (Addendum)
Procedure Name: Intubation Date/Time: 05/29/2017 7:58 AM Performed by: Roderic Palau, MD Pre-anesthesia Checklist: Patient identified, Emergency Drugs available, Patient being monitored, Timeout performed and Suction available Patient Re-evaluated:Patient Re-evaluated prior to induction Oxygen Delivery Method: Circle system utilized Preoxygenation: Pre-oxygenation with 100% oxygen Induction Type: IV induction Ventilation: Oral airway inserted - appropriate to patient size and Mask ventilation without difficulty Laryngoscope Size: Mac and 3 Grade View: Grade I Tube type: Oral Tube size: 7.0 mm Number of attempts: 1 Airway Equipment and Method: Stylet Placement Confirmation: ETT inserted through vocal cords under direct vision,  positive ETCO2 and breath sounds checked- equal and bilateral Secured at: 21 cm Tube secured with: Tape Dental Injury: Teeth and Oropharynx as per pre-operative assessment  Comments: Intubation per Aldean Baker

## 2017-05-29 NOTE — Op Note (Signed)
05/29/2017  10:51 AM  PATIENT:  Teresa Vincent  37 y.o. 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 Cari Caraway  ASSISTANTS: Meyran FNP  ANESTHESIA:   General  EBL: 55 ml  Total I/O In: 2000 [I.V.:2000] Out: 140 [Urine:85; Blood:55]  BLOOD ADMINISTERED: none  DRAINS: None  SPECIMEN:  none  INDICATION FOR PROCEDURE: This patient presented with severe back and bilateral leg pain. Imaging showed a lytic spondylolisthesis L5-S1 with minimal motion on flexion-extension views suggesting stability. The patient tried conservative measures without relief. Pain was debilitating. Recommended anterior lumbar interbody fusion L5-S1. Patient understood the risks, benefits, and alternatives and potential outcomes and wished to proceed.  PROCEDURE DETAILS: The patient was taken to the operating room and after induction of adequate generalized endotracheal anesthesia, the patient was placed in the supine position on the operating table and all pressure points were padded. The abdominal region was cleaned and then prepped with DuraPrep and draped in the usual sterile fashion. 5 cc of local anesthesia was injected and the exposure was performed by Dr. Edilia Bo of vascular surgery. The exposure will be dictated in a separate operative report. Once the retractor was in place to place a needle in the midline in the disc and checked AP fluoroscopy and then marked our midline. We then checked lateral fluoroscopy to confirm our level. This confirmed L5-S1. The annulus was incised and initial discectomy was done with pituitary rongeurs. I released the disc from the endplates with a Cobb elevator and then continued the discectomy. I then  prepared the endplates with curettes and scrapers. Then used the high-speed drill to further prepare the endplates as best I could. I cleaned out the corners. Ms Adelene Idler helped by holding a malleable retractor at the inferior part of wound to expose the sacrum and inferior part of the disc space. Because of the patient's body habitus, the Thompson retractor did not do a good job of this. Therefore, her assistance was crucial. I then used sequential trials. We tried the large and the medium trials. We tried different angles of lordosis. We found that the 15 mm 15 lordosis medium trial fit the best. We checked this with lateral fluoroscopy. We then packed a corresponding cage with morcellized allograft with extra small BMP in the center. We then tapped this into position at L5-S1 utilizing lateral fluoroscopy. We then tapped 1 Tank into the sacrum and 2 into the L5 vertebral body. These were 27 mm in length. This was also done under lateral fluoroscopy. We then removed the inserter and checked our construct AP and lateral fluoroscopy. We were pleased. We felt that anterior plating would be extremely difficult given her body habitus and angulation of the disc space. Because of her relative stability on flexion-extension views we felt that posterior percutaneous pedicle screws were not absolutely necessary. We irrigated. I removed the Thompson retractor. We checked a final construct once again. There was no bleeding. Ms Adelene Idler then a  closed the fascia with running 0 Vicryl with my assistance. She then closed the subcutaneous tissues with 0 Vicryl and 2-0 Vicryl and the subcuticular tissues with 3-0 Vicryl. The skin was then closed with Dermabond The drapes were removed, a sterile dressing was applied. A final x-ray was taken and read as  no foreign body retained. The patient was awakened from general anesthesia and transferred to the recovery room in stable condition. At the end of the procedure all sponge, needle and  instrument counts were correct.    PLAN OF CARE: Admit to inpatient   PATIENT DISPOSITION:  PACU - hemodynamically stable.   Delay start of Pharmacological VTE agent (>24hrs) due to surgical blood loss or risk of bleeding:  yes

## 2017-05-29 NOTE — Interval H&P Note (Signed)
History and Physical Interval Note:  05/29/2017 7:25 AM  Teresa Vincent  has presented today for surgery, with the diagnosis of Spondylolisthesis  The various methods of treatment have been discussed with the patient and family. After consideration of risks, benefits and other options for treatment, the patient has consented to  Procedure(s): ALIF - L5-S1 (N/A) ABDOMINAL EXPOSURE (N/A) as a surgical intervention .  The patient's history has been reviewed, patient examined, no change in status, stable for surgery.  I have reviewed the patient's chart and labs.  Questions were answered to the patient's satisfaction.     [name]

## 2017-05-29 NOTE — Progress Notes (Signed)
Patient ID: Teresa LanceCarolyn J Vincent, female   DOB: 01/20/1981, 37 y.o.   MRN: [id number] Doing well post-op, OOB to chair, appropriately sore, legs have good strength

## 2017-05-30 MED ORDER — CYCLOBENZAPRINE HCL 10 MG PO TABS: 10.0000 mg | ORAL_TABLET | Freq: Three times a day (TID) | ORAL | 1 refills | Status: DC | PRN

## 2017-05-30 MED ORDER — MAGNESIUM CITRATE PO SOLN
1.0000 | Freq: Once | ORAL | Status: AC
  Administered 2017-05-30: 1 via ORAL
  Filled 2017-05-30: qty 296

## 2017-05-30 MED ORDER — OXYCODONE HCL 5 MG PO TABS: 5.0000 mg | ORAL_TABLET | ORAL | 0 refills | Status: DC | PRN

## 2017-05-30 MED FILL — Thrombin For Soln 5000 Unit: CUTANEOUS | Qty: 5000 | Status: AC

## 2017-05-30 NOTE — Discharge Summary (Signed)
Physician Discharge Summary  Patient ID: Teresa Vincent MRN: [id number] DOB/AGE: 37/16/1982 37 y.o.  Admit date: 05/29/2017 Discharge date: 05/30/2017  Admission Diagnoses: lytic spondy L5-S1    Discharge Diagnoses: same   Discharged Condition: good  Hospital Course: The patient was admitted on 05/29/2017 and taken to the operating room where the patient underwent ALIF L5-S1. The patient tolerated the procedure well and was taken to the recovery room and then to the floor in stable condition. The hospital course was routine. There were no complications. The wound remained clean dry and intact. Pt had appropriate back soreness. No complaints of leg pain or new N/T/W. The patient remained afebrile with stable vital signs, and tolerated a regular diet. The patient continued to increase activities, and pain was well controlled with oral pain medications.   Consults: None  Significant Diagnostic Studies:  Results for orders placed or performed during the hospital encounter of 05/25/17  Basic metabolic panel  Result Value Ref Range   Sodium 138 135 - 145 mmol/L   Potassium 4.7 3.5 - 5.1 mmol/L   Chloride 105 101 - 111 mmol/L   CO2 24 22 - 32 mmol/L   Glucose, Bld 106 (H) 65 - 99 mg/dL   BUN 15 6 - 20 mg/dL   Creatinine, Ser 4.09 0.44 - 1.00 mg/dL   Calcium 9.3 8.9 - 81.1 mg/dL   GFR calc non Af Amer >60 >60 mL/min   GFR calc Af Amer >60 >60 mL/min   Anion gap 9 5 - 15  CBC  Result Value Ref Range   WBC 8.9 4.0 - 10.5 K/uL   RBC 4.79 3.87 - 5.11 MIL/uL   Hemoglobin 14.9 12.0 - 15.0 g/dL   HCT 91.4 78.2 - 95.6 %   MCV 91.4 78.0 - 100.0 fL   MCH 31.1 26.0 - 34.0 pg   MCHC 34.0 30.0 - 36.0 g/dL   RDW 21.3 08.6 - 57.8 %   Platelets 241 150 - 400 K/uL  Type and screen All Cardiac and thoracic surgeries, spinal fusions, myomectomies, craniotomies, colon & liver resections, total joint revisions, same day c-section with placenta previa or accreta.  Result Value Ref Range   ABO/RH(D)  O POS    Antibody Screen NEG    Sample Expiration 06/08/2017    Extend sample reason      NO TRANSFUSIONS OR PREGNANCY IN THE PAST 3 MONTHS Performed at [facility], [address] [place], [place] [address]     Dg Lumbar Spine 2-3 Views  Result Date: 05/29/2017 CLINICAL DATA:  L5-S1 anterior lumbar fusion EXAM: DG C-ARM 61-120 MIN; LUMBAR SPINE - 2-3 VIEW COMPARISON:  None. FLUOROSCOPY TIME:  1 minutes 20 seconds FINDINGS: Two fluoroscopic spot images are provided. Interval anterior lumbar interbody fusion at L5-S1. IMPRESSION: Interval anterior lumbar interbody fusion at L5-S1. Electronically Signed   By: Elige Ko   On: 05/29/2017 10:51   Dg C-arm 1-60 Min  Result Date: 05/29/2017 CLINICAL DATA:  L5-S1 anterior lumbar fusion EXAM: DG C-ARM 61-120 MIN; LUMBAR SPINE - 2-3 VIEW COMPARISON:  None. FLUOROSCOPY TIME:  1 minutes 20 seconds FINDINGS: Two fluoroscopic spot images are provided. Interval anterior lumbar interbody fusion at L5-S1. IMPRESSION: Interval anterior lumbar interbody fusion at L5-S1. Electronically Signed   By: Elige Ko   On: 05/29/2017 10:51   Dg Or Local Abdomen  Result Date: 05/29/2017 CLINICAL DATA:  Post PLIF for instrument count EXAM: OR LOCAL ABDOMEN COMPARISON:  CT lumbar spine dated  03/07/2017 FINDINGS: Postsurgical changes related to AS at L5-S1. No radiopaque foreign body is seen. IMPRESSION: No radiopaque foreign body is seen. These results were called by telephone at the time of interpretation on 05/29/2017 at 10:41 am to [facility] in OR 20, who verbally acknowledged these results. Electronically Signed   By: Charline Bills M.D.   On: 05/29/2017 10:41    Antibiotics:  Anti-infectives (From admission, onward)   Start     Dose/Rate Route Frequency Ordered Stop   05/29/17 1400  ceFAZolin (ANCEF) IVPB 2g/100 mL premix     2 g 200 mL/hr over 30 Minutes Intravenous Every 8 hours 05/29/17 1231 05/29/17 2243   05/29/17 0720  bacitracin 50,000  Units in sodium chloride irrigation 0.9 % 500 mL irrigation  Status:  Discontinued       As needed 05/29/17 0905 05/29/17 1059   05/29/17 0611  ceFAZolin (ANCEF) IVPB 2g/100 mL premix  Status:  Discontinued     2 g 200 mL/hr over 30 Minutes Intravenous On call to O.R. 05/29/17 2130 05/29/17 1216      Discharge Exam: Blood pressure 129/78, pulse 81, temperature 97.7 F (36.5 C), temperature source Oral, resp. rate 20, last menstrual period 02/25/2016, SpO2 100 %. Neurologic: Grossly normal Dressing dry  Discharge Medications:   Allergies as of 05/30/2017      Reactions   Adhesive [tape] Other (See Comments)   Skin irritation, patient prefers paper tape.    Celexa [citalopram Hydrobromide] Other (See Comments)   Make patient too carefree      Medication List    STOP taking these medications   traMADol 50 MG tablet Commonly known as:  ULTRAM     TAKE these medications   albuterol 108 (90 Base) MCG/ACT inhaler Commonly known as:  PROVENTIL HFA;VENTOLIN HFA Inhale 1-2 puffs into the lungs every 4 (four) hours as needed for wheezing or shortness of breath.   ALPRAZolam 0.5 MG tablet Commonly known as:  XANAX TAKE 1 TABLET BY MOUTH AT BEDTIME AS NEEDED FOR  SLEEP What changed:    how much to take  how to take this  when to take this  additional instructions   atorvastatin 20 MG tablet Commonly known as:  LIPITOR Take 1 tablet (20 mg total) by mouth daily.   cyclobenzaprine 10 MG tablet Commonly known as:  FLEXERIL Take 1 tablet (10 mg total) by mouth 3 (three) times daily as needed for muscle spasms.   gabapentin 300 MG capsule Commonly known as:  NEURONTIN TAKE 1 CAPSULE BY MOUTH IN THE MORNING AND 1 CAPSULE IN THE AFTERNOON AND 2 CAPSULES AT BEDTIME What changed:    how much to take  how to take this  when to take this  additional instructions   levothyroxine 150 MCG tablet Commonly known as:  SYNTHROID, LEVOTHROID Take 1 tablet (150 mcg total) by  mouth daily before breakfast.   lisinopril 10 MG tablet Commonly known as:  PRINIVIL,ZESTRIL Take 1 tablet (10 mg total) by mouth daily.   ondansetron 4 MG disintegrating tablet Commonly known as:  ZOFRAN ODT Take 1 tablet (4 mg total) every 8 (eight) hours as needed by mouth for nausea or vomiting.   oxyCODONE 5 MG immediate release tablet Commonly known as:  Oxy IR/ROXICODONE Take 1-2 tablets (5-10 mg total) by mouth every 3 (three) hours as needed for moderate pain or severe pain ((score 4 to 6)). What changed:    how much to take  when to take this  reasons to take this   pantoprazole 40 MG tablet Commonly known as:  PROTONIX 1 PO 30 MINUTES PRIOR TO MEALS QD What changed:    how much to take  how to take this  when to take this  additional instructions            Durable Medical Equipment  (From admission, onward)        Start     Ordered   05/29/17 1232  DME Walker rolling  Once    Question:  Patient needs a walker to treat with the following condition  Answer:  S/P lumbar fusion   05/29/17 1231   05/29/17 1232  DME 3 n 1  Once     05/29/17 1231      Disposition: home  Final Dx: ALIF L5-S1  Discharge Instructions     Remove dressing in 72 hours   Complete by:  As directed    Call MD for:  difficulty breathing, headache or visual disturbances   Complete by:  As directed    Call MD for:  persistant nausea and vomiting   Complete by:  As directed    Call MD for:  redness, tenderness, or signs of infection (pain, swelling, redness, odor or green/yellow discharge around incision site)   Complete by:  As directed    Call MD for:  severe uncontrolled pain   Complete by:  As directed    Call MD for:  temperature >100.4   Complete by:  As directed    Diet - low sodium heart healthy   Complete by:  As directed    Increase activity slowly   Complete by:  As directed       Follow-up Information    Tia AlertJones, Shahrzad Koble S, MD. Schedule an appointment as  soon as possible for a visit in 2 week(s).   Specialty:  Neurosurgery Contact information: [address] [place] [address] [phone]            Signed: Tia AlertJONES,Isabeau Mccalla S 05/30/2017, 12:59 PM

## 2017-05-30 NOTE — Progress Notes (Addendum)
[facility]  Subjective  - Doing well.  Objective 119/83 78 97.6 F (36.4 C) (Oral) 20 99%  Intake/Output Summary (Last 24 hours) at 05/30/2017 0820 Last data filed at 05/29/2017 2213 Gross per 24 hour  Intake 2993 ml  Output 390 ml  Net 2603 ml    Moving LE, ambulating in the room Abdominal incisions healing well, tolerating PO's.  Assessment/Planning: POD # 1 anterior lumbar exposure  Voiding, ambulating disposition stable from a vascular point of view.  Teresa Vincent 05/30/2017 8:20 AM --  I have interviewed the patient and examined the patient. I agree with the findings by the PA.  Cari Carawayhris Wei Newbrough, MD [phone]

## 2017-05-30 NOTE — Progress Notes (Signed)
Pt doing well. Pt given D/C instructions with Rx, verbal understanding was provided. Pt's IV was removed prior to D/C. Pt's incision is clean and dry with no sign of infection. Pt received RW and 3-n-1 from [facility] per MD order. Pt is stable @ D/C and has no other needs at this time. Pt D/C'd home via wheelchair @ 1830 per MD order. Rema FendtAshley Clarinda Obi, RN

## 2017-05-30 NOTE — Progress Notes (Signed)
Physical Therapy Treatment Patient Details Name: Teresa Vincent MRN: [id number] DOB: 1981-01-21 Today's Date: 05/30/2017    History of Present Illness 37 yo female with onset of back pain and received ALIF for L5-S1 lytic spondylolisthesis with DDD.  PMHx:  graves disease, hypothyroidism, migraines, neck pain, stroke, anxiety, kidney stones,     PT Comments    Patient progressing with able to negotiate stairs for home entry.  Feel she is mobilizing well, though slowly and with good amount of pain.  She reported concern for waking up overnight soaked in urine and not sure if due to back issues.  Did report very groggy with muscle relaxer so encouraged to report to MD, but also know medications can play a role.  PT to follow until d/c.   Follow Up Recommendations  No PT follow up     Equipment Recommendations  Rolling walker with 5" wheels    Recommendations for Other Services       Precautions / Restrictions Precautions Precautions: Back Precaution Comments: reviewed with mobility Required Braces or Orthoses: Spinal Brace Spinal Brace: Applied in sitting position    Mobility  Bed Mobility   Bed Mobility: Rolling;Sidelying to Sit;Sit to Sidelying Rolling: Supervision Sidelying to sit: Supervision;HOB elevated     Sit to sidelying: Mod assist General bed mobility comments: assist for LE's to lie down  Transfers Overall transfer level: Needs assistance Equipment used: Rolling walker (2 wheeled);None Transfers: Sit to/from Stand Sit to Stand: Supervision;Min guard         General transfer comment: intially with walker with S, then without device with minguard  Ambulation/Gait Ambulation/Gait assistance: Supervision Ambulation Distance (Feet): 100 Feet Assistive device: Rolling walker (2 wheeled) Gait Pattern/deviations: Step-through pattern;Decreased stride length;Trunk flexed     General Gait Details: self reminders about posture, already was up ambulating on  unit earlier on her own with brace and walker, reports walked overnight due to not sleeping well   Stairs Stairs: Yes   Stair Management: No rails;Forwards;Step to pattern Number of Stairs: 2 General stair comments: hand hold A on R with step to pattern for two steps (reports has porch then step into the house and educated to use walker to step into house); cues for sequence, assist for lifting to step (min) then more to assist with lowering (mod)  Wheelchair Mobility    Modified Rankin (Stroke Patients Only)       Balance Overall balance assessment: Needs assistance Sitting-balance support: Feet supported Sitting balance-Leahy Scale: Good       Standing balance-Leahy Scale: Good Standing balance comment: walking and toileting in bathroom without walker,  slow and reaching at times for hand hold, but steady                            Cognition Arousal/Alertness: Awake/alert Behavior During Therapy: WFL for tasks assessed/performed Overall Cognitive Status: Within Functional Limits for tasks assessed                                        Exercises      General Comments        Pertinent Vitals/Pain Pain Score: 8  Pain Location: back and legs esp LLE Pain Descriptors / Indicators: Aching;Operative site guarding Pain Intervention(s): Monitored during session;Repositioned;Ice applied    Home Living  Prior Function            PT Goals (current goals can now be found in the care plan section) Progress towards PT goals: Progressing toward goals    Frequency    Min 5X/week      PT Plan Current plan remains appropriate;Frequency needs to be updated    Co-evaluation              AM-PAC PT "6 Clicks" Daily Activity  Outcome Measure  Difficulty turning over in bed (including adjusting bedclothes, sheets and blankets)?: None Difficulty moving from lying on back to sitting on the side of the bed?  : Unable Difficulty sitting down on and standing up from a chair with arms (e.g., wheelchair, bedside commode, etc,.)?: A Lot Help needed moving to and from a bed to chair (including a wheelchair)?: A Little Help needed walking in hospital room?: A Little Help needed climbing 3-5 steps with a railing? : A Little 6 Click Score: 16    End of Session Equipment Utilized During Treatment: Back brace Activity Tolerance: Patient tolerated treatment well Patient left: in bed;with call bell/phone within reach   PT Visit Diagnosis: Unsteadiness on feet (R26.81);Pain;Muscle weakness (generalized) (M62.81) Pain - Right/Left: Left Pain - part of body: Hip     Time: [id number] PT Time Calculation (min) (ACUTE ONLY): 28 min  Charges:  $Gait Training: 8-22 mins $Therapeutic Activity: 8-22 mins                    G CodesSheran Lawless:       Cyndi Wynn, [place] CarolinaPT [phone] 05/30/2017    Elray Mcgregorynthia Wynn 05/30/2017, 12:59 PM

## 2017-05-30 NOTE — Discharge Instructions (Signed)
Wound Care You may remove outer bandage after shower.  Do not put any creams, lotions, or ointments on incision. Leave steri-strips on neck.  They will fall off by themselves. Activity Walk each and every day, increasing distance each day. No lifting greater than 5 lbs.  Avoid bending, arching, or twisting. No driving for 2 weeks; may ride as a passenger locally. If provided with back brace, wear when out of bed.  It is not necessary to wear in bed. Diet Resume your normal diet.  Return to Work Will be discussed at you follow up appointment. Call Your Doctor If Any of These Occur Redness, drainage, or swelling at the wound.  Temperature greater than 101 degrees. Severe pain not relieved by pain medication. Incision starts to come apart. Follow Up Appt Call today for appointment in 1-2 weeks [phone]) or for problems.  If you have any hardware placed in your spine, you will need an x-ray before your appointment.

## 2017-05-30 NOTE — Evaluation (Signed)
Occupational Therapy Evaluation Patient Details Name: Teresa Vincent MRN: [id number] DOB: 11-Oct-1980 Today's Date: 05/30/2017    History of Present Illness 37 yo female with onset of back pain and received ALIF for L5-S1 lytic spondylolisthesis with DDD.  PMHx:  graves disease, hypothyroidism, migraines, neck pain, stroke, anxiety, kidney stones,    Clinical Impression   This 37 y/o F presents with the above. At baseline pt is independent with ADLs and functional mobility. Pt completing room and hallway level functional mobility at RW level with MinGuard assist throughout; currently requires minA for LB ADLs secondary to adhering to back precautions. Education provided on back precautions, AE, safety and compensatory techniques for completing ADLs and functional transfers while adhering to precautions with pt verbalizing and return demonstrating understanding; questions answered throughout. Pt reports she will return home with family assist for ADLs PRN. Feel pt is safe to return home from OT standpoint with available family assist. No further acute OT needs identified at this time. Will sign off.     Follow Up Recommendations  Follow surgeon's recommendation for DC plan and follow-up therapies;Supervision/Assistance - 24 hour    Equipment Recommendations  3 in 1 bedside commode           Precautions / Restrictions Precautions Precautions: Back Precaution Booklet Issued: Yes (comment) Precaution Comments: reviewed with pt  Required Braces or Orthoses: Spinal Brace Spinal Brace: Applied in sitting position Restrictions Weight Bearing Restrictions: No      Mobility Bed Mobility Overal bed mobility: Needs Assistance Bed Mobility: Rolling;Sidelying to Sit Rolling: Supervision Sidelying to sit: Supervision;HOB elevated     Sit to sidelying: Mod assist General bed mobility comments: supervision for safety    Transfers Overall transfer level: Needs assistance Equipment used:  Rolling walker (2 wheeled);None Transfers: Sit to/from Stand Sit to Stand: Min guard         General transfer comment: minGuard for safety     Balance Overall balance assessment: Needs assistance Sitting-balance support: Feet supported Sitting balance-Leahy Scale: Good     Standing balance support: Bilateral upper extremity supported;During functional activity Standing balance-Leahy Scale: Good Standing balance comment: pt maintaining static standing balance and brief room level mobility without RW and close minguard for safety                            ADL either performed or assessed with clinical judgement   ADL Overall ADL's : Needs assistance/impaired Eating/Feeding: Modified independent;Sitting   Grooming: Min guard;Sitting   Upper Body Bathing: Min guard;Sitting   Lower Body Bathing: Minimal assistance;Sit to/from stand;With adaptive equipment   Upper Body Dressing : Minimal assistance;Sitting Upper Body Dressing Details (indicate cue type and reason): setup assist for spinal brace  Lower Body Dressing: Minimal assistance;Sit to/from stand;With adaptive equipment Lower Body Dressing Details (indicate cue type and reason): educated pt on use of reacher, sock aide and long handled shoe horn with pt return demonstrating use with min verbal cues  Toilet Transfer: Min guard;Ambulation;Regular Toilet;Grab bars   Toileting- Clothing Manipulation and Hygiene: Minimal assistance;Sit to/from stand Toileting - Clothing Manipulation Details (indicate cue type and reason): pt reports unable to reach to complete peri-care without breaking back precautions; educated on use of AE for task completion with pt verbalizing understanding    Tub/Shower Transfer Details (indicate cue type and reason): educated on safe tub transfer technique and use of 3:1 as shower seat for increased safety with pt verbalizing understanding; recommend pt  have family present to provide supervision  during tub transfer with pt verbalizing understanding  Functional mobility during ADLs: Min guard;Rolling walker General ADL Comments: education provided on AE, safety and compensatory techniques for completing ADLs and functional transfers while adhering to back precautions with pt verbalizing/return demonstrating understanding. Issued pt appropriate AE for use at home                          Pertinent Vitals/Pain Pain Assessment: Faces Pain Score: 8  Faces Pain Scale: Hurts even more Pain Location: back  Pain Descriptors / Indicators: Aching;Operative site guarding Pain Intervention(s): Monitored during session;Limited activity within patient's tolerance     Hand Dominance Right   Extremity/Trunk Assessment Upper Extremity Assessment Upper Extremity Assessment: Overall WFL for tasks assessed   Lower Extremity Assessment Lower Extremity Assessment: Defer to PT evaluation   Cervical / Trunk Assessment Cervical / Trunk Assessment: Other exceptions Cervical / Trunk Exceptions: s/p spinal surgery    Communication Communication Communication: No difficulties   Cognition Arousal/Alertness: Awake/alert Behavior During Therapy: WFL for tasks assessed/performed Overall Cognitive Status: Within Functional Limits for tasks assessed                                                      Home Living Family/patient expects to be discharged to:: Private residence Living Arrangements: Spouse/significant other;Children;Other relatives Available Help at Discharge: Family Type of Home: House Home Access: Stairs to enter Secretary/administrator of Steps: 2 Entrance Stairs-Rails: Left Home Layout: Two level Alternate Level Stairs-Number of Steps: 13   Bathroom Shower/Tub: Chief Strategy Officer: Standard     Home Equipment: Environmental consultant - 2 wheels          Prior Functioning/Environment Level of Independence: Independent with assistive device(s)                  OT Problem List: Decreased range of motion;Decreased activity tolerance;Decreased knowledge of use of DME or AE;Decreased knowledge of precautions            OT Goals(Current goals can be found in the care plan section) Acute Rehab OT Goals Patient Stated Goal: to get pain managed OT Goal Formulation: All assessment and education complete, DC therapy  OT Frequency: Min 2X/week                             AM-PAC PT "6 Clicks" Daily Activity     Outcome Measure Help from another person eating meals?: None Help from another person taking care of personal grooming?: A Little Help from another person toileting, which includes using toliet, bedpan, or urinal?: A Little Help from another person bathing (including washing, rinsing, drying)?: A Little Help from another person to put on and taking off regular upper body clothing?: A Little Help from another person to put on and taking off regular lower body clothing?: A Little 6 Click Score: 19   End of Session Equipment Utilized During Treatment: Gait belt;Back brace;Rolling walker Nurse Communication: Mobility status  Activity Tolerance: Patient tolerated treatment well Patient left: in chair;with call bell/phone within reach  OT Visit Diagnosis: Other abnormalities of gait and mobility (R26.89)                Time:  [id number] OT Time Calculation (min): 36 min Charges:  OT General Charges $OT Visit: 1 Visit OT Evaluation $OT Eval Low Complexity: 1 Low OT Treatments $Self Care/Home Management : 8-22 mins G-Codes:     Marcy SirenBreanna Cami Delawder, OT Pager [phone] 05/30/2017   Orlando PennerBreanna L Norine Reddington 05/30/2017, 2:19 PM

## 2017-05-31 ENCOUNTER — Encounter (HOSPITAL_COMMUNITY): Payer: Self-pay | Admitting: Neurological Surgery

## 2017-05-31 MED FILL — Heparin Sodium (Porcine) Inj 1000 Unit/ML: INTRAMUSCULAR | Qty: 30 | Status: AC

## 2017-05-31 MED FILL — Sodium Chloride IV Soln 0.9%: INTRAVENOUS | Qty: 1000 | Status: AC

## 2017-06-05 ENCOUNTER — Encounter (HOSPITAL_COMMUNITY): Payer: Self-pay | Admitting: Neurological Surgery

## 2017-06-05 ENCOUNTER — Encounter: Payer: Self-pay | Admitting: Nurse Practitioner

## 2017-06-19 ENCOUNTER — Other Ambulatory Visit: Payer: Self-pay | Admitting: Family Medicine

## 2017-06-20 NOTE — Telephone Encounter (Signed)
Refill x4 

## 2017-06-20 NOTE — Telephone Encounter (Signed)
Actually refill x2 needs follow-up office visit with Dr. Brett CanalesSteve

## 2017-07-01 ENCOUNTER — Encounter: Payer: Self-pay | Admitting: Nurse Practitioner

## 2017-07-15 ENCOUNTER — Other Ambulatory Visit: Payer: Self-pay | Admitting: Family Medicine

## 2017-08-15 ENCOUNTER — Encounter (HOSPITAL_COMMUNITY): Payer: Self-pay

## 2017-08-15 DIAGNOSIS — M549 Dorsalgia, unspecified: Secondary | ICD-10-CM | POA: Insufficient documentation

## 2017-08-15 DIAGNOSIS — Z5321 Procedure and treatment not carried out due to patient leaving prior to being seen by health care provider: Secondary | ICD-10-CM | POA: Diagnosis not present
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
## 2017-08-16 ENCOUNTER — Emergency Department (HOSPITAL_COMMUNITY)
Admission: EM | Admit: 2017-08-16 | Discharge: 2017-08-16 | Disposition: A | Discharge status: Home / Self Care | Payer: Medicaid Other | Attending: Emergency Medicine | Admitting: Emergency Medicine

## 2017-08-16 NOTE — ED Notes (Signed)
Follow up call made  Pt is going back to ed today  Has appointment w surgeon on August 21 1113 08/16/17 s Braxten Memmer rn

## 2017-08-16 NOTE — ED Notes (Signed)
Called no answer

## 2017-08-17 ENCOUNTER — Encounter: Payer: Self-pay | Admitting: Family Medicine

## 2017-08-17 ENCOUNTER — Ambulatory Visit: Payer: Medicaid Other | Admitting: Family Medicine

## 2017-08-17 VITALS — BP 138/82 | Temp 99.0°F | Ht 67.0 in | Wt 237.1 lb

## 2017-08-17 DIAGNOSIS — K122 Cellulitis and abscess of mouth: Secondary | ICD-10-CM | POA: Diagnosis not present

## 2017-08-17 MED ORDER — AMOXICILLIN-POT CLAVULANATE 875-125 MG PO TABS: 1.0000 | ORAL_TABLET | Freq: Two times a day (BID) | ORAL | 0 refills | Status: DC

## 2017-08-17 MED ORDER — PREDNISONE 20 MG PO TABS: ORAL_TABLET | ORAL | 0 refills | Status: DC

## 2017-08-17 NOTE — Progress Notes (Signed)
   Subjective:    Patient ID: Teresa Vincent, female    DOB: [birth year], 37 y.o.   MRN: [id number]  HPI Patient is here today due to swelling in her throat.Throat is not sore, just has some draingage, feels like she wants to gag.This started this am and she has not taken anything.  She relates some coughing she denies any high fever chills sweats she states that her throat does not appear to be sore but her uvula is swollen and she feels it in the back of her throat  Review of Systems Please see above eardrums are normal    Objective:   Physical Exam Throat normal except for the uvula which is red and swollen extremities no edema neck no masses lungs clear no crackles       Assessment & Plan:  Uvulitis As antibiotics prescribed Follow-up if progressive troubles or worse warning signs discussed  Short course prednisone to help with the swelling  Warning signs were discussed in detail go to ER if worse

## 2017-08-21 ENCOUNTER — Telehealth: Payer: Self-pay | Admitting: Family Medicine

## 2017-08-21 NOTE — Telephone Encounter (Signed)
Refill request from pharmacy requesting refill on Xanax 0.5mg  take one tablet by mouth at bedtime as needed for sleep. Request also states "We need to make sure you're aware patient is also on Oxycodone 10mg  from Dr.David Jones and you're okay with that. We also need diagnosis/why patient is on Xanax? [id number]".

## 2017-08-21 NOTE — Telephone Encounter (Signed)
Dr. Steves patient 

## 2017-08-22 ENCOUNTER — Ambulatory Visit: Payer: Medicaid Other | Admitting: Nurse Practitioner

## 2017-08-22 NOTE — Telephone Encounter (Signed)
Spoke with Hermenia BersJohnna, Manufacturing systems engineerharmacy Manager at [place] [facility]. She stated that since the CDC states that opioid and benzos are a dangerous combo, they just need to have a verbal that doctor has reviewed risk and that patient is aware of the risk also. Since different meds are prescribed by different doctors, sometimes one provider doesn't know that another provider has prescribed a new med. Stated that they just need to make sure all angles are covered. Stated that they are not trying to question providers medical expertise. Wants to make sure that provider is talking to patient every so often about tapering down or coming off of meds. States that they just need verbal understanding when it comes to the combos of benzo/opioid or other potentially dangerous combos. Stated this patient has refills remaining and that she will refill when office returns call and lets them know that the Xanax is needed for sleep.

## 2017-08-22 NOTE — Telephone Encounter (Signed)
2 Simple questions for [facility]: Is this a refill already prescribed by our office? Then fill it  Is [facility] now going to insist on a clinical conversaion with us every time a patient comes in for refill on their chronic controlled medications.? Document answer and send back to me

## 2017-08-27 ENCOUNTER — Other Ambulatory Visit: Payer: Self-pay | Admitting: Family Medicine

## 2017-09-03 ENCOUNTER — Encounter: Payer: Self-pay | Admitting: Nurse Practitioner

## 2017-09-03 ENCOUNTER — Ambulatory Visit: Payer: Medicaid Other | Admitting: Nurse Practitioner

## 2017-09-03 VITALS — BP 134/86 | Ht 67.0 in | Wt 237.0 lb

## 2017-09-03 DIAGNOSIS — F5101 Primary insomnia: Secondary | ICD-10-CM | POA: Diagnosis not present

## 2017-09-03 DIAGNOSIS — R5383 Other fatigue: Secondary | ICD-10-CM

## 2017-09-03 DIAGNOSIS — R0683 Snoring: Secondary | ICD-10-CM

## 2017-09-03 DIAGNOSIS — N94819 Vulvodynia, unspecified: Secondary | ICD-10-CM | POA: Diagnosis not present

## 2017-09-03 MED ORDER — ONDANSETRON 4 MG PO TBDP: ORAL_TABLET | ORAL | 0 refills | Status: DC

## 2017-09-03 MED ORDER — CYCLOBENZAPRINE HCL 10 MG PO TABS: 10.0000 mg | ORAL_TABLET | Freq: Three times a day (TID) | ORAL | 1 refills | Status: DC | PRN

## 2017-09-03 MED ORDER — ALPRAZOLAM 0.5 MG PO TABS: ORAL_TABLET | ORAL | 2 refills | Status: DC

## 2017-09-03 MED ORDER — GABAPENTIN 300 MG PO CAPS: ORAL_CAPSULE | ORAL | 5 refills | Status: DC

## 2017-09-04 ENCOUNTER — Encounter: Payer: Self-pay | Admitting: Nurse Practitioner

## 2017-09-04 NOTE — Progress Notes (Signed)
Subjective:  Presents to discuss her sleep apnea. Has filled out her [place] questionnaire. Chronic history of snoring and fatigue. Has been told by family that she has frequent brief pauses in her breathing. In addition, she has chronic insomnia with limited sleep. Has tried home remedies and Xanax with minimal improvement. Was prescribed amitriptyline by her specialist, but has cut this in half due to side effects.  Objective:   BP 134/86   Ht 5\' 7"  (1.702 m)   Wt 237 lb (107.5 kg)   LMP 02/25/2016   BMI 37.12 kg/m  NAD. Alert, oriented. Lungs clear. Heart RRR.   Assessment:  Snoring  Fatigue, unspecified type    Plan:   Meds ordered this encounter  Medications  . ALPRAZolam (XANAX) 0.5 MG tablet    Sig: TAKE 0.5 MG BY MOUTH AT BEDTIME AS NEEDED FOR SLEEP    Dispense:  30 tablet    Refill:  2    Order Specific Question:   Supervising Provider    Answer:   Merlyn AlbertLUKING, WILLIAM S [[id number]]  . cyclobenzaprine (FLEXERIL) 10 MG tablet    Sig: Take 1 tablet (10 mg total) by mouth 3 (three) times daily as needed for muscle spasms.    Dispense:  90 tablet    Refill:  1    Order Specific Question:   Supervising Provider    Answer:   Merlyn AlbertLUKING, WILLIAM S [[id number]]  . gabapentin (NEURONTIN) 300 MG capsule    Sig: TAKE 1 CAPSULE BY MOUTH IN THE MORNING AND 1 CAPSULE IN THE AFTERNOON AND 2 CAPSULES AT BEDTIME    Dispense:  120 capsule    Refill:  5    Please consider 90 day supplies to promote better adherence    Order Specific Question:   Supervising Provider    Answer:   Merlyn AlbertLUKING, WILLIAM S [[id number]]  . ondansetron (ZOFRAN-ODT) 4 MG disintegrating tablet    Sig: DISSOLVE 1 TABLET IN MOUTH EVERY 8 HOURS AS NEEDED FOR NAUSEA OR VOMITING    Dispense:  30 tablet    Refill:  0    Please consider 90 day supplies to promote better adherence    Order Specific Question:   Supervising Provider    Answer:   Merlyn AlbertLUKING, WILLIAM S [[id number]]   Refill given on her routine medications. Will refer for sleep study. Need  this evaluation first then deal with chronic insomnia. Follow up with neurosurgeon as planned. 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  Return in about 6 months (around 03/05/2018) for recheck. Call back sooner if needed.

## 2017-10-01 ENCOUNTER — Other Ambulatory Visit: Payer: Self-pay

## 2017-10-01 ENCOUNTER — Encounter (HOSPITAL_COMMUNITY): Payer: Self-pay | Admitting: Emergency Medicine

## 2017-10-01 ENCOUNTER — Emergency Department (HOSPITAL_COMMUNITY)
Admission: EM | Admit: 2017-10-01 | Discharge: 2017-10-01 | Disposition: A | Discharge status: Home / Self Care | Payer: Medicaid Other | Attending: Emergency Medicine | Admitting: Emergency Medicine

## 2017-10-01 DIAGNOSIS — R42 Dizziness and giddiness: Secondary | ICD-10-CM | POA: Diagnosis not present

## 2017-10-01 DIAGNOSIS — R110 Nausea: Secondary | ICD-10-CM | POA: Insufficient documentation

## 2017-10-01 DIAGNOSIS — R51 Headache: Secondary | ICD-10-CM | POA: Diagnosis not present

## 2017-10-01 DIAGNOSIS — S20161A Insect bite (nonvenomous) of breast, right breast, initial encounter: Secondary | ICD-10-CM | POA: Insufficient documentation

## 2017-10-01 DIAGNOSIS — Y998 Other external cause status: Secondary | ICD-10-CM | POA: Insufficient documentation

## 2017-10-01 DIAGNOSIS — Z5321 Procedure and treatment not carried out due to patient leaving prior to being seen by health care provider: Secondary | ICD-10-CM | POA: Insufficient documentation

## 2017-10-01 DIAGNOSIS — Y929 Unspecified place or not applicable: Secondary | ICD-10-CM | POA: Diagnosis not present

## 2017-10-01 DIAGNOSIS — Y939 Activity, unspecified: Secondary | ICD-10-CM | POA: Insufficient documentation

## 2017-10-01 DIAGNOSIS — W57XXXA Bitten or stung by nonvenomous insect and other nonvenomous arthropods, initial encounter: Secondary | ICD-10-CM | POA: Diagnosis not present

## 2017-10-01 NOTE — ED Triage Notes (Addendum)
Patient complaining of insect bite to right breast x 2 days. After triage, patient complains of headache, nausea, and dizziness starting this morning.

## 2017-10-01 NOTE — ED Notes (Signed)
Called x1. No answer.

## 2017-10-01 NOTE — ED Notes (Signed)
Called no answer x2 

## 2017-10-01 NOTE — ED Notes (Signed)
Pt called no answer x3  

## 2017-10-02 NOTE — ED Notes (Signed)
Follow up call made pt states went to a urgent care and got treated  10/02/17  1008  s Talley Casco rn

## 2017-10-09 ENCOUNTER — Other Ambulatory Visit: Payer: Self-pay | Admitting: Family Medicine

## 2017-10-09 ENCOUNTER — Other Ambulatory Visit: Payer: Self-pay | Admitting: Nurse Practitioner

## 2017-10-16 ENCOUNTER — Encounter: Payer: Self-pay | Admitting: "Endocrinology

## 2017-10-17 ENCOUNTER — Telehealth: Payer: Self-pay | Admitting: Family Medicine

## 2017-10-17 NOTE — Telephone Encounter (Signed)
Spoke with patient. Pt states she has not heard anything about her sleep study. Spoke with Enid DerryBrendale and Enid DerryBrendale states that this ordered was put in 09/14/17 and the sleep lab was suppose to contact patient. Called patient back and gave [facility] [phone]). Pt is going to call them tomorrow and check on this and is going to call us back. Will forward this to Dr.Steve for input next week. Pt verbalized understanding.

## 2017-10-17 NOTE — Telephone Encounter (Signed)
1.  I read over Teresa Vincent's note 2.  Appears and may well have sleep apnea 3.  Needs to have sleep study please find out status of this 4.  I choose not to refill the Xanax with the issue of sleep apnea given Teresa Vincent note 5.  Patient can follow through with Teresa Vincent 6.  Please forward message to Teresa Vincent for further input next week

## 2017-10-17 NOTE — Telephone Encounter (Signed)
Patient said that [facility] is refusing to refill her Rx for Alprazolam 0.5mg  because Eber JonesCarolyn is the prescriber and she is leaving.  She said she needs Dr. Brett CanalesSteve to rewrite the Rx in his name to fill.  [facility] [place]

## 2017-10-22 ENCOUNTER — Other Ambulatory Visit: Payer: Self-pay | Admitting: *Deleted

## 2017-10-22 DIAGNOSIS — R0683 Snoring: Secondary | ICD-10-CM

## 2017-10-23 ENCOUNTER — Other Ambulatory Visit: Payer: Self-pay | Admitting: *Deleted

## 2017-10-23 MED ORDER — ALPRAZOLAM 0.5 MG PO TABS: ORAL_TABLET | ORAL | 5 refills | Status: DC

## 2017-10-23 NOTE — Telephone Encounter (Signed)
May ref xanax qhs plsus five monthly refills

## 2017-10-23 NOTE — Telephone Encounter (Signed)
Script printed await signature from dr steve 

## 2017-10-23 NOTE — Telephone Encounter (Signed)
Pt notified rx for xanax sent to pharm.

## 2017-10-26 ENCOUNTER — Ambulatory Visit: Payer: Medicaid Other | Admitting: "Endocrinology

## 2017-10-30 ENCOUNTER — Other Ambulatory Visit: Payer: Self-pay

## 2017-10-30 DIAGNOSIS — E1165 Type 2 diabetes mellitus with hyperglycemia: Secondary | ICD-10-CM

## 2017-10-30 DIAGNOSIS — E039 Hypothyroidism, unspecified: Secondary | ICD-10-CM

## 2017-10-30 DIAGNOSIS — E785 Hyperlipidemia, unspecified: Secondary | ICD-10-CM

## 2017-11-01 ENCOUNTER — Other Ambulatory Visit: Payer: Self-pay | Admitting: Family Medicine

## 2017-11-04 ENCOUNTER — Ambulatory Visit: Payer: Medicaid Other | Attending: Nurse Practitioner | Admitting: Neurology

## 2017-11-04 DIAGNOSIS — Z79899 Other long term (current) drug therapy: Secondary | ICD-10-CM | POA: Diagnosis not present

## 2017-11-04 DIAGNOSIS — G4733 Obstructive sleep apnea (adult) (pediatric): Secondary | ICD-10-CM | POA: Diagnosis not present

## 2017-11-04 DIAGNOSIS — Z79890 Hormone replacement therapy (postmenopausal): Secondary | ICD-10-CM | POA: Insufficient documentation

## 2017-11-04 DIAGNOSIS — R0683 Snoring: Secondary | ICD-10-CM | POA: Diagnosis present

## 2017-11-06 ENCOUNTER — Other Ambulatory Visit: Payer: Self-pay | Admitting: *Deleted

## 2017-11-06 ENCOUNTER — Telehealth: Payer: Self-pay | Admitting: Family Medicine

## 2017-11-06 MED ORDER — CYCLOBENZAPRINE HCL 10 MG PO TABS: 10.0000 mg | ORAL_TABLET | Freq: Three times a day (TID) | ORAL | 1 refills | Status: DC | PRN

## 2017-11-06 NOTE — Telephone Encounter (Signed)
Patient stated she will call her medicaid social worker and see what can be done so she can get her medications from both doctors.

## 2017-11-06 NOTE — Telephone Encounter (Signed)
Spoke with pharmacy. Patient the controlled substance lock in under medicaid and can only fill controlled substances forn the one locked in Dr -which is her pain management doctor- medicaid is refusing the fill from us as pre scriber. If patient switches us to her lock in provider she will no longer be able to get pain med from pain management. Please advise.

## 2017-11-06 NOTE — Telephone Encounter (Signed)
We will not become her oxycodone provider, I was not aware that medicaid is refusing sleep med for folks on chronic pain med

## 2017-11-06 NOTE — Telephone Encounter (Signed)
Patient is wanting when can she get her xanax,she states someone called her and told her it was ready but when she went to pick it up they have it on hold [facility] [address] .[facility] states [facility]

## 2017-11-08 NOTE — Procedures (Signed)
[facility] Bennet Kujawa A. Gerilyn Pilgrimoonquah, MD     [email or web address]             NOCTURNAL POLYSOMNOGRAPHY   LOCATION: [facility]  Patient Name: Teresa Vincent, Teresa Vincent Study Date: 11/04/2017 Gender: Female D.O.B: 1980-06-21 Age (years): 37 Referring Provider: Campbell Richesarolyn C Hoskins NP Height (inches): 67 Interpreting Physician: Beryle BeamsKofi Ezma Rehm MD, ABSM Weight (lbs): 237 RPSGT: Alfonso EllisHedrick, Debra BMI: 37 MRN: [id number] Neck Size: 20.00 <br> <br> CLINICAL INFORMATION Sleep Study Type: NPSG    Indication for sleep study: Snoring    Epworth Sleepiness Score: 6    SLEEP STUDY TECHNIQUE As per the AASM Manual for the Scoring of Sleep and Associated Events v2.3 (April 2016) with a hypopnea requiring 4% desaturations.  The channels recorded and monitored were frontal, central and occipital EEG, electrooculogram (EOG), submentalis EMG (chin), nasal and oral airflow, thoracic and abdominal wall motion, anterior tibialis EMG, snore microphone, electrocardiogram, and pulse oximetry.  MEDICATIONS Medications self-administered by patient taken the night of the study : N/A  Current Outpatient Medications:  .  ALPRAZolam (XANAX) 0.5 MG tablet, TAKE 0.5 MG BY MOUTH AT BEDTIME AS NEEDED FOR SLEEP, Disp: 30 tablet, Rfl: 5 .  amitriptyline (ELAVIL) 25 MG tablet, Take 25 mg by mouth at bedtime., Disp: , Rfl:  .  atorvastatin (LIPITOR) 20 MG tablet, Take 1 tablet (20 mg total) by mouth daily., Disp: 30 tablet, Rfl: 6 .  cyclobenzaprine (FLEXERIL) 10 MG tablet, Take 1 tablet (10 mg total) by mouth 3 (three) times daily as needed for muscle spasms., Disp: 90 tablet, Rfl: 1 .  gabapentin (NEURONTIN) 300 MG capsule, TAKE 1 CAPSULE BY MOUTH IN THE MORNING AND 1 CAPSULE IN THE AFTERNOON AND 2 CAPSULES AT BEDTIME, Disp: 120 capsule, Rfl: 5 .  levothyroxine (SYNTHROID, LEVOTHROID) 150 MCG tablet, Take 1 tablet (150 mcg total) by mouth daily before breakfast., Disp: 30 tablet, Rfl: 6 .  lisinopril  (PRINIVIL,ZESTRIL) 10 MG tablet, Take 1 tablet (10 mg total) by mouth daily., Disp: 30 tablet, Rfl: 6 .  ondansetron (ZOFRAN-ODT) 4 MG disintegrating tablet, DISSOLVE 1 TABLET IN MOUTH EVERY 8 HOURS AS NEEDED FOR NAUSEA AND VOMITING, Disp: 30 tablet, Rfl: 6 .  oxyCODONE (OXY IR/ROXICODONE) 5 MG immediate release tablet, Take 1-2 tablets (5-10 mg total) by mouth every 3 (three) hours as needed for moderate pain or severe pain ((score 4 to 6))., Disp: 60 tablet, Rfl: 0 .  pantoprazole (PROTONIX) 40 MG tablet, 1 PO 30 MINUTES PRIOR TO MEALS QD (Patient taking differently: Take 40 mg by mouth daily before breakfast. 30 MINUTES PRIOR TO MEAL), Disp: 31 tablet, Rfl: 11 .  PROVENTIL HFA 108 (90 Base) MCG/ACT inhaler, INHALE 1 TO 2 PUFFS INTO LUNGS EVERY 4 HOURS AS NEEDED FOR WHEEZING OR SHORTNESS OF BREATH, Disp: 7 each, Rfl: 3    SLEEP ARCHITECTURE The study was initiated at 11:05:26 PM and ended at 5:41:21 AM.  Sleep onset time was 49.8 minutes and the sleep efficiency was 79.3%%. The total sleep time was 314.2 minutes.  Stage REM latency was 108.0 minutes.  The patient spent 4.9%% of the night in stage N1 sleep, 53.7%% in stage N2 sleep, 13.2%% in stage N3 and 28.2% in REM.  Alpha intrusion was absent.  Supine sleep was 25.94%.  RESPIRATORY PARAMETERS The overall apnea/hypopnea index (AHI) was 30.7 per hour. There were 80 total apneas, including 50 obstructive, 9 central and 21 mixed apneas. There were 81 hypopneas and 0 RERAs.  The AHI during Stage REM sleep was  82.0 per hour.  AHI while supine was 36.8 per hour.  The mean oxygen saturation was 91.7%. The minimum SpO2 during sleep was 81.0%.  moderate snoring was noted during this study.  CARDIAC DATA The 2 lead EKG demonstrated sinus rhythm. The mean heart rate was 66.8 beats per minute. Other EKG findings include: None. LEG MOVEMENT DATA The total PLMS were 0 with a resulting PLMS index of 0.0. Associated arousal with leg movement  index was 0.0.  IMPRESSIONS - Moderate obstructive sleep apnea worse during REM sleep is documented with this study. AutoPAP 8-12 is recommended.     Argie Ramming, MD Diplomate, American Board of Sleep Medicine.  ELECTRONICALLY SIGNED ON:  11/08/2017, 3:36 PM [facility] PH: [phone]   FX: [phone] ACCREDITED BY THE AMERICAN ACADEMY OF SLEEP MEDICINE

## 2017-11-26 LAB — COMPREHENSIVE METABOLIC PANEL
AG Ratio: 1.8 (calc) (ref 1.0–2.5)
ALT: 8 U/L (ref 6–29)
AST: 13 U/L (ref 10–30)
Albumin: 4.1 g/dL (ref 3.6–5.1)
Alkaline phosphatase (APISO): 68 U/L (ref 33–115)
BUN: 10 mg/dL (ref 7–25)
CO2: 29 mmol/L (ref 20–32)
Calcium: 9.2 mg/dL (ref 8.6–10.2)
Chloride: 106 mmol/L (ref 98–110)
Creat: 1.02 mg/dL (ref 0.50–1.10)
GLUCOSE: 99 mg/dL (ref 65–99)
Globulin: 2.3 g/dL (calc) (ref 1.9–3.7)
Potassium: 4.4 mmol/L (ref 3.5–5.3)
SODIUM: 141 mmol/L (ref 135–146)
TOTAL PROTEIN: 6.4 g/dL (ref 6.1–8.1)
Total Bilirubin: 0.4 mg/dL (ref 0.2–1.2)

## 2017-11-26 LAB — TSH: TSH: 11.73 mIU/L — ABNORMAL HIGH

## 2017-11-26 LAB — LIPID PANEL
CHOL/HDL RATIO: 4.9 (calc) (ref <5.0)
Cholesterol: 156 mg/dL (ref <200)
HDL: 32 mg/dL — AB (ref >50)
LDL CHOLESTEROL (CALC): 91 mg/dL
Non-HDL Cholesterol (Calc): 124 mg/dL (calc) (ref <130)
Triglycerides: 236 mg/dL — ABNORMAL HIGH (ref <150)

## 2017-11-26 LAB — HEMOGLOBIN A1C
HEMOGLOBIN A1C: 5.9 %{Hb} — AB (ref <5.7)
MEAN PLASMA GLUCOSE: 123 (calc)
eAG (mmol/L): 6.8 (calc)

## 2017-11-26 LAB — T4, FREE: Free T4: 1 ng/dL (ref 0.8–1.8)

## 2017-11-28 ENCOUNTER — Ambulatory Visit: Payer: Medicaid Other | Admitting: Family Medicine

## 2017-11-28 ENCOUNTER — Encounter: Payer: Self-pay | Admitting: Family Medicine

## 2017-11-28 VITALS — BP 120/78 | Ht 67.0 in | Wt 228.6 lb

## 2017-11-28 DIAGNOSIS — M7711 Lateral epicondylitis, right elbow: Secondary | ICD-10-CM

## 2017-11-28 DIAGNOSIS — I10 Essential (primary) hypertension: Secondary | ICD-10-CM

## 2017-11-28 MED ORDER — METHYLPREDNISOLONE ACETATE 40 MG/ML IJ SUSP: 40.0000 mg | Freq: Once | INTRAMUSCULAR | Status: DC

## 2017-11-28 NOTE — Progress Notes (Signed)
   Subjective:    Patient ID: Teresa Vincent, female    DOB: 06/21/1980, 37 y.o.   MRN: [id number]  HPI  Patient arrives with ongoing right elbow pain for 6 weeks. Patient states she has been to the ER twice for her elbow pain on 11/16/17 and 11/22/17 but it is no better.  Pt's new job put strain on the arm  Works at [employer]  Has hurt worse and worse over the past 6 weeks   Went to ER twice, was placed in a sping first time a er, took her out of work,  Hospital doctor back to er five d later, xray neg  Placed pt on splint and sent home  Pt called her ortho, dr case in [place],  Shoulder pai has calmed down  Blood pressure medicine and blood pressure levels reviewed today with patient. Compliant with blood pressure medicine. States does not miss a dose. No obvious side effects. Blood pressure generally good when checked elsewhere. Watching salt intake.     Review of Systems No headache, no major weight loss or weight gain, no chest pain no back pain abdominal pain no change in bowel habits complete ROS otherwise negative     Objective:   Physical Exam Alert vitals stable, NAD. Blood pressure good on repeat. HEENT normal. Lungs clear. Heart regular rate and rhythm. Distinctly tender right lateral epicondyle.       Assessment & Plan:  Impression lateral epicondylitis.  Negative x-rays.  Duration months.  Unresponsive to conservative therapy injection discussed patient would like to get it.  2.  Hypertension.  Good control.  Discussed to maintain same meds  Patient was prepped draped and anesthetized and injected with 1/2 cc Depo-Medrol 1 cc lidocaine.  Local measures discussed.  Forearm strap discussed

## 2017-12-04 ENCOUNTER — Encounter: Payer: Self-pay | Admitting: "Endocrinology

## 2017-12-04 ENCOUNTER — Ambulatory Visit (INDEPENDENT_AMBULATORY_CARE_PROVIDER_SITE_OTHER): Payer: Medicaid Other | Admitting: "Endocrinology

## 2017-12-04 VITALS — BP 134/83 | HR 66 | Ht 67.0 in | Wt 226.0 lb

## 2017-12-04 DIAGNOSIS — E782 Mixed hyperlipidemia: Secondary | ICD-10-CM | POA: Diagnosis not present

## 2017-12-04 DIAGNOSIS — I10 Essential (primary) hypertension: Secondary | ICD-10-CM | POA: Diagnosis not present

## 2017-12-04 DIAGNOSIS — E890 Postprocedural hypothyroidism: Secondary | ICD-10-CM | POA: Diagnosis not present

## 2017-12-04 DIAGNOSIS — R7303 Prediabetes: Secondary | ICD-10-CM | POA: Diagnosis not present

## 2017-12-04 DIAGNOSIS — F17200 Nicotine dependence, unspecified, uncomplicated: Secondary | ICD-10-CM | POA: Insufficient documentation

## 2017-12-04 MED ORDER — LEVOTHYROXINE SODIUM 175 MCG PO TABS: 175.0000 ug | ORAL_TABLET | Freq: Every day | ORAL | 6 refills | Status: DC

## 2017-12-04 NOTE — Progress Notes (Signed)
Endocrinology follow-up note   Subjective:    Patient ID: Teresa Vincent, female    DOB: 03-Oct-1980, PCP [name], MD   Past Medical History:  Diagnosis Date  . Abnormal Pap smear of cervix   . ANXIETY 08/30/2007  . Chronic back pain   . GERD (gastroesophageal reflux disease)   . GRAVES' DISEASE 12/07/2008  . History of kidney stones   . Hypertension   . 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 INSOMNIA 08/30/2007  . MIGRAINE HEADACHE 08/30/2007  . Neck pain   . Palpitations    occasionally couple times a week  . Sciatica   . Stroke [facility])    Past Surgical History:  Procedure Laterality Date  . ABDOMINAL EXPOSURE N/A 05/29/2017   Procedure: ABDOMINAL EXPOSURE;  Surgeon: Chuck Hint, MD;  Location: [facility] OR;  Service: Vascular;  Laterality: N/A;  . ANTERIOR LUMBAR FUSION N/A 05/29/2017   Procedure: Anterior Lumbar Interbody Fusion  - Lumbar five sacral one;  Surgeon: Tia Alert, MD;  Location[facility] OR;  Service: Neurosurgery;  Laterality: N/A;  . CARPAL TUNNEL RELEASE Bilateral   . CHOLECYSTECTOMY    . CHOLECYSTECTOMY N/A 12/29/2016   Procedure: LAPAROSCOPIC CHOLECYSTECTOMY;  Surgeon: [name], MD;  Location: [facility] ORS;  Service: General;  Laterality: N/A;  . LUMBAR EPIDURAL INJECTION    . STERIOD INJECTION N/A 01/10/2017   Procedure: MINOR EXPAREL INJECTION;  Surgeon: [name], MD;  Location: [facility] ORS;  Service: General;  Laterality: N/A;  . TOTAL ABDOMINAL HYSTERECTOMY W/ BILATERAL SALPINGOOPHORECTOMY N/A   . TUBAL LIGATION  2003   Social History   Socioeconomic History  . Marital status: Single    Spouse name: Not on file  . Number of children: Not on file  . Years of education: Not on file  . Highest education level: Not on file  Occupational History  . Occupation: Doesn't work outside the home  Social Needs  . Financial resource strain: Not on file  . Food insecurity:    Worry: Not on file    Inability: Not on file  .  Transportation needs:    Medical: Not on file    Non-medical: Not on file  Tobacco Use  . Smoking status: Current Every Day Smoker    Packs/day: 1.00    Years: 26.00    Pack years: 26.00    Types: Cigarettes  . Smokeless tobacco: Never Used  . Tobacco comment: currently 1 pack or less  Substance and Sexual Activity  . Alcohol use: Yes    Alcohol/week: 0.0 standard drinks    Comment: occasional  . Drug use: No  . Sexual activity: Not Currently    Birth control/protection: Surgical  Lifestyle  . Physical activity:    Days per week: Not on file    Minutes per session: Not on file  . Stress: Not on file  Relationships  . Social connections:    Talks on phone: Not on file    Gets together: Not on file    Attends religious service: Not on file    Active member of club or organization: Not on file    Attends meetings of clubs or organizations: Not on file    Relationship status: Not on file  Other Topics Concern  . Not on file  Social History Narrative  . Not on file   Outpatient Encounter Medications as of 12/04/2017  Medication Sig  . ALPRAZolam (XANAX) 0.5 MG tablet TAKE 0.5 MG BY MOUTH  AT BEDTIME AS NEEDED FOR SLEEP  . amitriptyline (ELAVIL) 25 MG tablet Take 25 mg by mouth at bedtime.  Marland Kitchen atorvastatin (LIPITOR) 20 MG tablet Take 1 tablet (20 mg total) by mouth daily.  . cyclobenzaprine (FLEXERIL) 10 MG tablet Take 1 tablet (10 mg total) by mouth 3 (three) times daily as needed for muscle spasms.  Marland Kitchen gabapentin (NEURONTIN) 300 MG capsule TAKE 1 CAPSULE BY MOUTH IN THE MORNING AND 1 CAPSULE IN THE AFTERNOON AND 2 CAPSULES AT BEDTIME  . levothyroxine (SYNTHROID, LEVOTHROID) 175 MCG tablet Take 1 tablet (175 mcg total) by mouth daily before breakfast.  . lisinopril (PRINIVIL,ZESTRIL) 10 MG tablet Take 1 tablet (10 mg total) by mouth daily.  . ondansetron (ZOFRAN-ODT) 4 MG disintegrating tablet DISSOLVE 1 TABLET IN MOUTH EVERY 8 HOURS AS NEEDED FOR NAUSEA AND VOMITING  . oxyCODONE  (OXY IR/ROXICODONE) 5 MG immediate release tablet Take 1-2 tablets (5-10 mg total) by mouth every 3 (three) hours as needed for moderate pain or severe pain ((score 4 to 6)).  Marland Kitchen pantoprazole (PROTONIX) 40 MG tablet 1 PO 30 MINUTES PRIOR TO MEALS QD (Patient taking differently: Take 40 mg by mouth daily before breakfast. 30 MINUTES PRIOR TO MEAL)  . PROVENTIL HFA 108 (90 Base) MCG/ACT inhaler INHALE 1 TO 2 PUFFS INTO LUNGS EVERY 4 HOURS AS NEEDED FOR WHEEZING OR SHORTNESS OF BREATH  . [DISCONTINUED] levothyroxine (SYNTHROID, LEVOTHROID) 150 MCG tablet Take 1 tablet (150 mcg total) by mouth daily before breakfast.   Facility-Administered Encounter Medications as of 12/04/2017  Medication  . methylPREDNISolone acetate (DEPO-MEDROL) injection 40 mg   ALLERGIES: Allergies  Allergen Reactions  . Adhesive [Tape] Other (See Comments)    Skin irritation, patient prefers paper tape.   Budd Palmer [Citalopram Hydrobromide] Other (See Comments)    Make patient too carefree   VACCINATION STATUS: Immunization History  Administered Date(s) Administered  . Influenza-Unspecified 12/11/2015, 12/13/2016  . Td 05/11/2006  . Tdap 12/13/2016    HPI  [name] is a 37 year old female patient with medical history as above. She is here to follow-up for hypothyroidism. 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 She was treated with various doses of levothyroxine over the years currently at 150 g by mouth every morning. She has been more consistent in taking her thyroid hormone.   She returns for follow-up with repeat thyroid function test.  She has lost 14 pounds since last visit intentionally.  Her thyroid function tests are consistent with inadequate replacement.    -She also has hyperlipidemia on atorvastatin and hypertension on lisinopril. -She remains a heavy smoker, complains of fatigue.  She denies cold intolerance,  palpitations, tremors.  -She denies any family history of thyroid dysfunction. She denies any personal history of goiter nor family history of thyroid cancer.   Review of Systems  Constitutional: + Weight loss ,  + fatigue, no subjective hyperthermia/hypothermia Eyes: no blurry vision, no xerophthalmia ENT: no sore throat, no nodules palpated in throat, no dysphagia/odynophagia, no hoarseness Cardiovascular: no CP/SOB ,  - palpitations,  -leg swelling Skin: no rashes Neurological: no tremors/numbness/tingling/dizziness Psychiatric: no depression, +anxiety  Objective:    BP 134/83   Pulse 66   Ht 5\' 7"  (1.702 m)   Wt 226 lb (102.5 kg)   LMP 02/25/2016   BMI 35.40 kg/m   Wt Readings from Last 3 Encounters:  12/04/17 226 lb (102.5 kg)  11/28/17 228 lb 9.6  oz (103.7 kg)  10/01/17 230 lb (104.3 kg)    Physical Exam  Constitutional: + Obese, not in acute distress. Eyes: PERRLA, EOMI, no exophthalmos ENT: moist mucous membranes, no thyromegaly, no cervical lymphadenopathy  Gastrointestinal: abdomen soft, NT, ND, BS+ Musculoskeletal: no deformities, + limited spinal flexibility, strength intact in all 4 Skin: moist, warm, no rashes Neurological: no tremor with outstretched hands, DTR normal in all 4   Complete Blood Count (Most recent): Lab Results  Component Value Date   WBC 8.9 05/25/2017   HGB 14.9 05/25/2017   HCT 43.8 05/25/2017   MCV 91.4 05/25/2017   PLT 241 05/25/2017   Chemistry (most recent): Lab Results  Component Value Date   NA 141 11/26/2017   K 4.4 11/26/2017   CL 106 11/26/2017   CO2 29 11/26/2017   BUN 10 11/26/2017   CREATININE 1.02 11/26/2017   Recent Results (from the past 2160 hour(s))  Lipid Panel     Status: Abnormal   Collection Time: 11/26/17  8:26 AM  Result Value Ref Range   Cholesterol 156 <200 mg/dL   HDL 32 (L) >95 mg/dL   Triglycerides 621 (H) <150 mg/dL    Comment: . If a non-fasting specimen was collected, consider repeat  triglyceride testing on a fasting specimen if clinically indicated.  Perry Mount et al. J. of Clin. Lipidol. 2015;9:129-169. Marland Kitchen    LDL Cholesterol (Calc) 91 mg/dL (calc)    Comment: Reference range: <100 . Desirable range <100 mg/dL for primary prevention;   <70 mg/dL for patients with CHD or diabetic patients  with > or = 2 CHD risk factors. Marland Kitchen LDL-C is now calculated using the Martin-Hopkins  calculation, which is a validated novel method providing  better accuracy than the Friedewald equation in the  estimation of LDL-C.  Horald Pollen et al. Lenox Ahr. 3086;578(46): 2061-2068  (http://education.[facility].com/faq/FAQ164)    Total CHOL/HDL Ratio 4.9 <5.0 (calc)   Non-HDL Cholesterol (Calc) 124 <130 mg/dL (calc)    Comment: For patients with diabetes plus 1 major ASCVD risk  factor, treating to a non-HDL-C goal of <100 mg/dL  (LDL-C of <96 mg/dL) is considered a therapeutic  option.   Comprehensive metabolic panel     Status: None   Collection Time: 11/26/17  8:26 AM  Result Value Ref Range   Glucose, Bld 99 65 - 99 mg/dL    Comment: .            Fasting reference interval .    BUN 10 7 - 25 mg/dL   Creat 2.95 2.84 - 1.32 mg/dL   BUN/Creatinine Ratio NOT APPLICABLE 6 - 22 (calc)   Sodium 141 135 - 146 mmol/L   Potassium 4.4 3.5 - 5.3 mmol/L   Chloride 106 98 - 110 mmol/L   CO2 29 20 - 32 mmol/L   Calcium 9.2 8.6 - 10.2 mg/dL   Total Protein 6.4 6.1 - 8.1 g/dL   Albumin 4.1 3.6 - 5.1 g/dL   Globulin 2.3 1.9 - 3.7 g/dL (calc)   AG Ratio 1.8 1.0 - 2.5 (calc)   Total Bilirubin 0.4 0.2 - 1.2 mg/dL   Alkaline phosphatase (APISO) 68 33 - 115 U/L   AST 13 10 - 30 U/L   ALT 8 6 - 29 U/L  T4, Free     Status: None   Collection Time: 11/26/17  8:26 AM  Result Value Ref Range   Free T4 1.0 0.8 - 1.8 ng/dL  TSH     Status: Abnormal  Collection Time: 11/26/17  8:26 AM  Result Value Ref Range   TSH 11.73 (H) mIU/L    Comment:           Reference Range .           > or = 20  Years  0.40-4.50 .                Pregnancy Ranges           First trimester    0.26-2.66           Second trimester   0.55-2.73           Third trimester    0.43-2.91   Hemoglobin A1c     Status: Abnormal   Collection Time: 11/26/17  8:26 AM  Result Value Ref Range   Hgb A1c MFr Bld 5.9 (H) <5.7 % of total Hgb    Comment: For someone without known diabetes, a hemoglobin  A1c value between 5.7% and 6.4% is consistent with prediabetes and should be confirmed with a  follow-up test. . For someone with known diabetes, a value <7% indicates that their diabetes is well controlled. A1c targets should be individualized based on duration of diabetes, age, comorbid conditions, and other considerations. . This assay result is consistent with an increased risk of diabetes. . Currently, no consensus exists regarding use of hemoglobin A1c for diagnosis of diabetes for children. .    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  I discussed and increase her levothyroxine to 175 mcg p.o. every morning.    - We discussed about correct intake of levothyroxine, at fasting, with water, separated by at least 30 minutes from breakfast, and separated by more than 4 hours from calcium, iron, multivitamins, acid reflux medications (PPIs). -Patient is made aware of the fact that thyroid hormone replacement is needed for life, dose to be adjusted by periodic monitoring of thyroid function tests.   2. Hyperlipidemia-  -She has responded significantly for atorvastatin treatment, with improving LDL.  She still has hypertriglyceridemia.  I advised her to continue atorvastatin 20 mg p.o. nightly.  She is also advised to cut back on fried food and butter  as well as simple cottonoids.  H  3) prediabetes-A1c of 5.9% -His first diagnosis.  She would not need  medications. -She is extensively counseled on diet and exercise. -  Suggestion is made for her to avoid simple carbohydrates  from her diet including Cakes, Sweet Desserts / Pastries, Ice Cream, Soda (diet and regular), Sweet Tea, Candies, Chips, Cookies, Store Bought Juices, Alcohol in Excess of  1-2 drinks a day, Artificial Sweeteners, and "Sugar-free" Products. This will help patient to have stable blood glucose profile and potentially avoid unintended weight gain.  -If her A1c remains above 5.7% during her next visit, she will be considered for metformin treatment.   4.hypertension: Her blood pressure is controlled to target. -I advised her to continue lisinopril 10 mg p.o. Daily.  - I advised patient to maintain close follow up with Merlyn AlbertLuking, William S, MD for primary care needs. Follow up plan: Return in about 6 months (around 06/04/2018) for Follow up with Pre-visit Labs.    - Time spent with the patient: 25 min, of which >50% was spent in reviewing her  current and  previous labs, previous treatments, and medications  doses and developing a plan for long-term care.  Emelia Sandoval Duecker participated in the discussions, expressed understanding, and voiced agreement with the above plans.  All questions were answered to her satisfaction. she is encouraged to contact clinic should she have any questions or concerns prior to her return visit.   Marquis Lunch, MD Phone: [phone]  Fax: [phone]  -  This note was partially dictated with voice recognition software. Similar sounding words can be transcribed inadequately or may not  be corrected upon review.  12/04/2017, 9:00 AM

## 2017-12-04 NOTE — Patient Instructions (Signed)

## 2017-12-07 ENCOUNTER — Telehealth: Payer: Self-pay | Admitting: Family Medicine

## 2017-12-07 DIAGNOSIS — M7711 Lateral epicondylitis, right elbow: Secondary | ICD-10-CM

## 2017-12-07 NOTE — Telephone Encounter (Signed)
Ortho referral  

## 2017-12-07 NOTE — Telephone Encounter (Signed)
Pt came in on 11/28/17 with right elbow pain. She receive a shot for that and was told it would take a couple of days to work then the pain should ease off. When she first came in she described the pain at about a 10. Now after the shot and it being a couple of weeks the pain is still a strong 7. She is wanting to know what the next step should be.

## 2017-12-07 NOTE — Telephone Encounter (Signed)
Referral ordered in Epic. Left message to return call to notify patient. 

## 2017-12-10 ENCOUNTER — Telehealth: Payer: Self-pay | Admitting: Family Medicine

## 2017-12-10 NOTE — Telephone Encounter (Signed)
ok 

## 2017-12-10 NOTE — Telephone Encounter (Signed)
Pt returned call and states that she does not want to go from doctor to doctor. Pt states that the shot helped for about 4 days. Informed patient that the shot was not meant to be a cure but meant to help and pt states that it did help for about 4 days and then the soreness came back into elbow. Pt also states that she got sick on Saturday and believes it is a URI. Pt states that she woke up with chest and throat burning. Pt states she has been using inhaler more to open up lungs to breathe. Pt stated that she was having trouble breathing and SOB. Advised patient to go to ER or urgent care. Pt agreed to see ortho also.

## 2017-12-10 NOTE — Telephone Encounter (Signed)
Spoke with patient earlier regarding ortho referral. Informed patient to go to ED or urgent care due to SOB/ trouble breathing. Pt verbalized understanding.

## 2017-12-10 NOTE — Telephone Encounter (Signed)
Pt with possible URI  Sinus pain pressure, cough, SOB, wheeze, using inhaler  Please advise - NTBS?    [facility]

## 2017-12-14 ENCOUNTER — Encounter: Payer: Self-pay | Admitting: Family Medicine

## 2017-12-20 ENCOUNTER — Telehealth: Payer: Self-pay | Admitting: Family Medicine

## 2017-12-20 NOTE — Telephone Encounter (Signed)
Pt is calling to check and see if her sleep study results are back.

## 2017-12-20 NOTE — Telephone Encounter (Signed)
Please advise 

## 2017-12-21 ENCOUNTER — Encounter: Payer: Self-pay | Admitting: Family Medicine

## 2017-12-21 ENCOUNTER — Ambulatory Visit: Payer: Medicaid Other | Admitting: Family Medicine

## 2017-12-21 VITALS — BP 120/78 | Ht 67.0 in | Wt 224.8 lb

## 2017-12-21 DIAGNOSIS — R519 Headache, unspecified: Secondary | ICD-10-CM

## 2017-12-21 DIAGNOSIS — R61 Generalized hyperhidrosis: Secondary | ICD-10-CM

## 2017-12-21 DIAGNOSIS — R5383 Other fatigue: Secondary | ICD-10-CM

## 2017-12-21 DIAGNOSIS — R51 Headache: Secondary | ICD-10-CM | POA: Diagnosis not present

## 2017-12-21 DIAGNOSIS — W57XXXA Bitten or stung by nonvenomous insect and other nonvenomous arthropods, initial encounter: Secondary | ICD-10-CM

## 2017-12-21 NOTE — Progress Notes (Signed)
   Subjective:    Patient ID: Teresa Vincent, female    DOB: Jan 01, 1981, 37 y.o.   MRN: [id number]  HPI  Patient arrives with migraine headaches off and on for 2 weeks. Patient also has night sweats, blurred vision, dizziness, hand numbness and sensitivity to sun light. Patient also reports constant extreme fatigue.   Headaches about every other day for the last 2 weeks, located frontal and behind both eyes, reports it feels "tight." Reports associated symptoms of blurred vision, dizziness, hand numbness, and photophobia. Takes 1/2 oxycodone and relieves h/a. Night sweats from shoulders up only for the last month or so.   Patient also states she has a tick bite 3-4 weeks ago, small tick, she removed.  Denies any new stressors, denies depression symptoms  Reports hx of hysterectomy, but still has ovaries.  Review of Systems  Constitutional: Negative for fever.  Musculoskeletal: Negative for arthralgias.  Skin: Negative for rash.       Objective:   Physical Exam  Constitutional: She is oriented to person, place, and time. She appears well-developed and well-nourished. No distress.  HENT:  Head: Normocephalic and atraumatic.  Right Ear: Tympanic membrane normal.  Left Ear: Tympanic membrane normal.  Nose: Nose normal.  Mouth/Throat: Oropharynx is clear and moist.  Eyes: Pupils are equal, round, and reactive to light. EOM are normal. Right eye exhibits no discharge. Left eye exhibits no discharge.  Neck: Neck supple. No thyromegaly present.  Cardiovascular: Normal rate, regular rhythm and normal heart sounds.  No murmur heard. Pulmonary/Chest: Effort normal and breath sounds normal. No respiratory distress.  Lymphadenopathy:    She has no cervical adenopathy.  Neurological: She is alert and oriented to person, place, and time. She displays a negative Romberg sign. Coordination normal.  Nursing note and vitals reviewed.     Assessment & Plan:  1. Acute nonintractable  headache, unspecified headache type - Plan: Ambulatory referral to Neurology  Most likely crescendo type migraines, neurological exam without any deficits. Pt with significant concern due to change in her headache symptoms and would benefit from a referral to a headache specialist.   2. Tick bite, initial encounter - Plan: CBC with Differential/Platelet, B. burgdorfi antibodies, [place] spotted fvr abs pnl(IgG+IgM)  Unlikely that her symptoms are r/t tick-borne illness, however will check a CBC and titers to verify.  3. Night sweats - Plan: FSH/LH  Pt has hx of hysterectomy with single oophorectomy - will check hormone levels to ensure remaining ovary is functioning appropriately.  Dr. Brett Canales was consulted on this case, he examined the patient and helped in the development of this plan.   25 minutes was spent with the patient.  This statement verifies that 25 minutes was indeed spent with the patient.  More than 50% of this visit-total duration of the visit-was spent in counseling and coordination of care. The issues that the patient came in for today as reflected in the diagnosis (s) please refer to documentation for further details.

## 2017-12-23 NOTE — Telephone Encounter (Signed)
Call seep lab why is this listed as "in progress" without a definitive report?

## 2017-12-24 ENCOUNTER — Telehealth: Payer: Self-pay | Admitting: *Deleted

## 2017-12-24 ENCOUNTER — Telehealth: Payer: Self-pay

## 2017-12-24 ENCOUNTER — Ambulatory Visit: Payer: Medicaid Other | Admitting: Family Medicine

## 2017-12-24 ENCOUNTER — Telehealth: Payer: Self-pay | Admitting: Family Medicine

## 2017-12-24 ENCOUNTER — Encounter: Payer: Self-pay | Admitting: Family Medicine

## 2017-12-24 VITALS — BP 122/80 | Ht 67.0 in | Wt 226.0 lb

## 2017-12-24 DIAGNOSIS — G4730 Sleep apnea, unspecified: Secondary | ICD-10-CM

## 2017-12-24 NOTE — Telephone Encounter (Signed)
I reviewed over the patient's sleep study  I also reviewed over notes that Dr.Doonquah noted. Patient was scheduled this morning but left because results were not available at that time.  Test results does show moderate obstructive sleep apnea.  The patient does have frequent pauses with her breathing wear oxygen level does go down.  The treatment for this his CPAP.  This treatment actually helps keep her airway open and moving air which will allow her to have a more restful rejuvenating sleep.  Typically will lessen her symptoms of fatigue and daytime sleepiness.  Auto titration is recommended.  Patient will need a CPAP machine ordered with auto titration of 8 to 12 cm of pressure  Please assist with this order.  The patient will need to schedule a follow-up office visit within 30 days of the initiation of CPAP.  The purpose of this follow-up is to make sure that patient is tolerating the CPAP and getting benefit from it.  That follow-up should be with Dr. Brett Canales.

## 2017-12-24 NOTE — Telephone Encounter (Signed)
Called pt and she could not come back today because she had to go to work and leave by 8:30. She states she is off on thurdsday and Friday this week if she has to reschedule but would like to see if doctor will just call her with results. She gets off at 5pm and has a 30 min lunch somewhere between 2 - 3:30.

## 2017-12-24 NOTE — Telephone Encounter (Signed)
I called and left a message to r/c. 

## 2017-12-24 NOTE — Telephone Encounter (Signed)
Phone message documentation was done regarding this patient. If the patient needs additional detailed information I will be happy to sit down with her and help discuss this. If the patient would like the report sent to her we can arrange for that as well.

## 2017-12-24 NOTE — Telephone Encounter (Signed)
Do I still need to call the sleep lab? Please advise.

## 2017-12-24 NOTE — Progress Notes (Signed)
   Subjective:    Patient ID: Teresa Vincent, female    DOB: November 30, 1980, 37 y.o.   MRN: [id number]  HPIpt following up on sleep study results. Done august 18th at [facility].   Pt states no other concerns today.     Review of Systems     Objective:   Physical Exam       Patient was not seen today by the physician or provider Assessment & Plan:  Initially the results could not be found The patient was informed this and decided to leave and be called the results The results were found and interpreted by myself I recommend CPAP The patient was called regarding this she will follow-up in several weeks regarding how effective the CPAP is

## 2017-12-24 NOTE — Telephone Encounter (Signed)
It is not necessary, I have the results  Please see the other phone message- within that phone message I discussed what the sleep study shows It is important that the patient be told these details Please make sure that the patient is aware of the findings of her sleep study If the patient is having significant questions regarding sleep study I recommend CPAP machine recommended per specialists

## 2017-12-24 NOTE — Telephone Encounter (Signed)
Pt states she will call back to schedule.

## 2017-12-24 NOTE — Telephone Encounter (Signed)
Patient is aware 

## 2017-12-24 NOTE — Telephone Encounter (Signed)
The patient will need to schedule a follow-up office visit within 30 days of the initiation of CPAP.  The purpose of this follow-up is to make sure that patient is tolerating the CPAP and getting benefit from it.  That follow-up should be with Dr. Brett Canales.

## 2017-12-24 NOTE — Telephone Encounter (Signed)
Please assist pt, If you need [place] let us know.

## 2017-12-25 LAB — CBC WITH DIFFERENTIAL/PLATELET
BASOS ABS: 0 10*3/uL (ref 0.0–0.2)
Basos: 0 %
EOS (ABSOLUTE): 0.2 10*3/uL (ref 0.0–0.4)
EOS: 2 %
HEMATOCRIT: 46.6 % (ref 34.0–46.6)
HEMOGLOBIN: 15.9 g/dL (ref 11.1–15.9)
IMMATURE GRANS (ABS): 0.1 10*3/uL (ref 0.0–0.1)
Immature Granulocytes: 1 %
LYMPHS: 24 %
Lymphocytes Absolute: 2.2 10*3/uL (ref 0.7–3.1)
MCH: 30.8 pg (ref 26.6–33.0)
MCHC: 34.1 g/dL (ref 31.5–35.7)
MCV: 90 fL (ref 79–97)
MONOCYTES: 7 %
Monocytes Absolute: 0.7 10*3/uL (ref 0.1–0.9)
NEUTROS ABS: 6.1 10*3/uL (ref 1.4–7.0)
Neutrophils: 66 %
Platelets: 273 10*3/uL (ref 150–450)
RBC: 5.16 x10E6/uL (ref 3.77–5.28)
RDW: 13.2 % (ref 12.3–15.4)
WBC: 9.2 10*3/uL (ref 3.4–10.8)

## 2017-12-25 LAB — FSH/LH
FSH: 7.9 m[IU]/mL
LH: 5.5 m[IU]/mL

## 2017-12-25 LAB — ROCKY MTN SPOTTED FVR ABS PNL(IGG+IGM)
RMSF IGM: 0.72 {index} (ref 0.00–0.89)
RMSF IgG: NEGATIVE

## 2017-12-25 LAB — B. BURGDORFI ANTIBODIES: Lyme IgG/IgM Ab: <0.91 {ISR} (ref 0.00–0.90)

## 2017-12-26 ENCOUNTER — Telehealth: Payer: Self-pay | Admitting: Family Medicine

## 2017-12-26 ENCOUNTER — Encounter: Payer: Self-pay | Admitting: Family Medicine

## 2017-12-26 NOTE — Telephone Encounter (Signed)
This form was completed the patient should do a follow-up office visit within 30 days of starting the CPAP to show the essentially that it is effective for her and this also verifies to [employer] company she is doing the proper follow-through so that they will continue to pay for the CPAP machine

## 2017-12-26 NOTE — Telephone Encounter (Signed)
Pt returned call, gave info needed on when to come in for CPAP follow up, pt verbalized understanding

## 2017-12-26 NOTE — Telephone Encounter (Signed)
Order sent to Dr. Lorin Picket to sign, all records printed & ready to send once order is ready Will notify pt once everything is sent

## 2017-12-26 NOTE — Telephone Encounter (Signed)
Please sign order for Auto CPAP so I may fax with all required documentation ° °In red folder in basket on wall °

## 2017-12-26 NOTE — Telephone Encounter (Signed)
Left message to return call. Pt needs to set up office visit after she get CPAP machine.

## 2017-12-27 ENCOUNTER — Encounter: Payer: Self-pay | Admitting: Neurology

## 2017-12-28 NOTE — Telephone Encounter (Signed)
Pt aware that order was faxed to [facility] 12/27/17

## 2017-12-31 ENCOUNTER — Encounter: Payer: Self-pay | Admitting: Family Medicine

## 2017-12-31 ENCOUNTER — Ambulatory Visit: Payer: Medicaid Other | Admitting: Family Medicine

## 2017-12-31 VITALS — BP 128/86 | Temp 98.0°F | Ht 67.0 in | Wt 225.0 lb

## 2017-12-31 DIAGNOSIS — J019 Acute sinusitis, unspecified: Secondary | ICD-10-CM

## 2017-12-31 MED ORDER — AMOXICILLIN-POT CLAVULANATE 875-125 MG PO TABS: 1.0000 | ORAL_TABLET | Freq: Two times a day (BID) | ORAL | 0 refills | Status: AC

## 2017-12-31 NOTE — Progress Notes (Signed)
   Subjective:    Patient ID: Teresa Vincent, female    DOB: 08/23/1980, 37 y.o.   MRN: [id number]  Sinusitis  This is a new problem. Episode onset: 2 weeks. Associated symptoms include congestion, coughing, ear pain and sneezing. Pertinent negatives include no shortness of breath or sore throat. (Wheezing) Past treatments include nothing.   Went to ED in [place] about 2 weeks ago, was given oral steroids for about 5 days per patient, was also given a rx for monodoxycycline per pt which she states was not covered by her insurance, so she has not been on an abx.   Reports continued congestion and non-productive cough x 2.5 weeks. Reports bilateral ear pressure. Reports breathing and wheezing has been better since finished steroids, reports she also has an inhaler she is using, used early this morning, first time she has used it this week.   Review of Systems  Constitutional: Negative for fever.  HENT: Positive for congestion, ear pain and sneezing. Negative for sore throat.   Eyes: Negative for discharge.  Respiratory: Positive for cough. Negative for shortness of breath and wheezing.   Gastrointestinal: Negative for nausea and vomiting.      Objective:   Physical Exam  Constitutional: She is oriented to person, place, and time. She appears well-developed and well-nourished. No distress.  HENT:  Head: Normocephalic and atraumatic.  Right Ear: Tympanic membrane normal.  Left Ear: Tympanic membrane normal.  Nose: No sinus tenderness.  Mouth/Throat: Uvula is midline. Posterior oropharyngeal erythema present.  Eyes: Right eye exhibits no discharge. Left eye exhibits no discharge.  Neck: Neck supple.  Cardiovascular: Normal rate, regular rhythm and normal heart sounds.  Pulmonary/Chest: Effort normal. No respiratory distress. She has no wheezes.  Bronchial congestion heard during expiration  Lymphadenopathy:    She has no cervical adenopathy.  Neurological: She is alert and oriented to  person, place, and time.  Skin: Skin is warm and dry.  Psychiatric: She has a normal mood and affect.  Nursing note and vitals reviewed.     Assessment & Plan:  1. Acute rhinosinusitis Given duration of symptoms likely this has become a post-viral bacterial sinusitis. Will treat with augmentin as prescribed. Warning signs discussed and pt will f/u if symptoms worsen or fail to improve.   - amoxicillin-clavulanate (AUGMENTIN) 875-125 MG tablet; Take 1 tablet by mouth 2 (two) times daily for 10 days.  Dr. Brett Canales was consulted on this case and is in agreement with the above plan.

## 2018-01-12 ENCOUNTER — Other Ambulatory Visit: Payer: Self-pay | Admitting: Neurological Surgery

## 2018-01-12 DIAGNOSIS — M4317 Spondylolisthesis, lumbosacral region: Secondary | ICD-10-CM

## 2018-01-14 IMAGING — DX DG CHEST 2V
2 series · 2 of 2 positions shown · non-contrast
Comparison: 11/27/2016

CLINICAL DATA: Upper abdominal pain radiating to the flank and
groin

EXAM:
CHEST  2 VIEW

[chest pa]
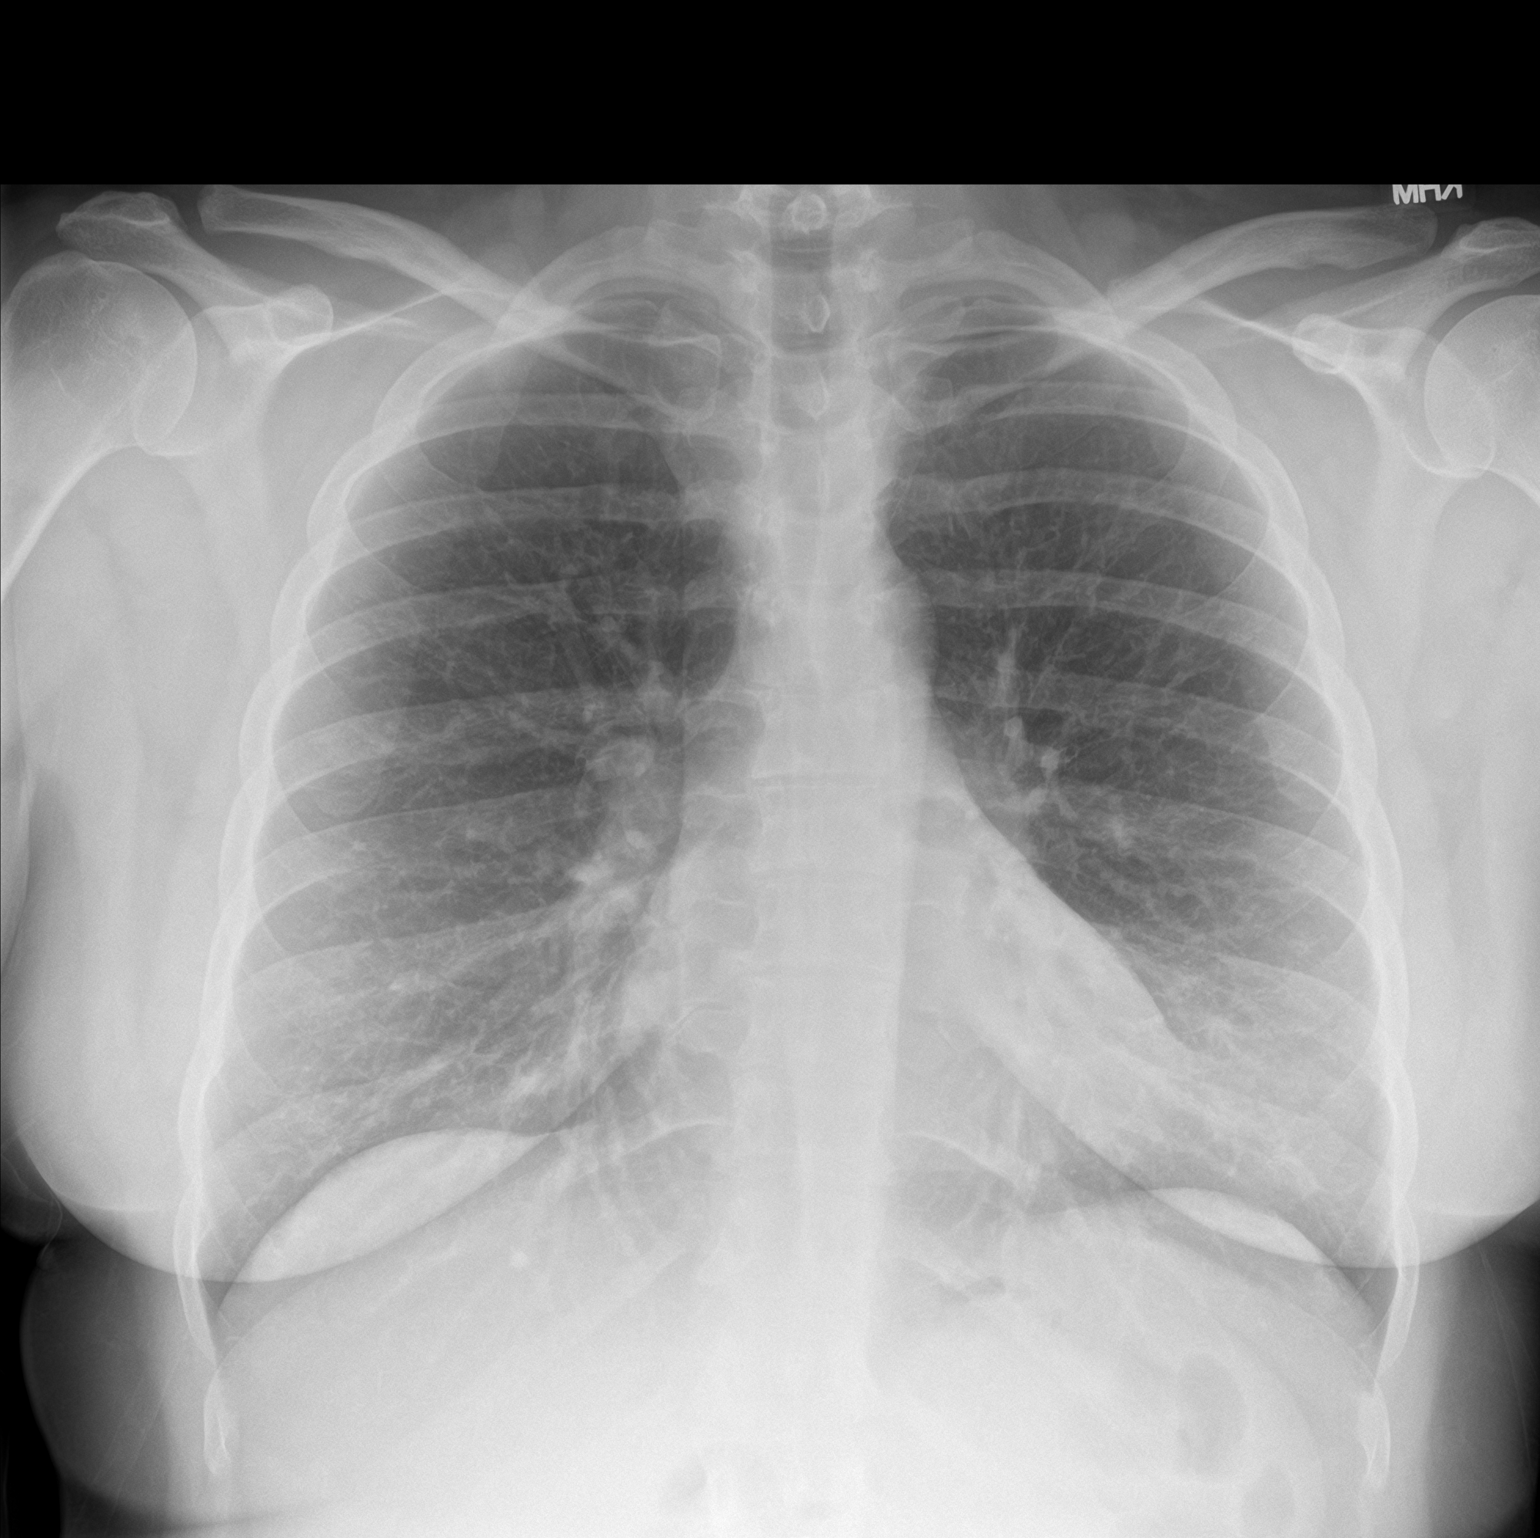

[chest lat]
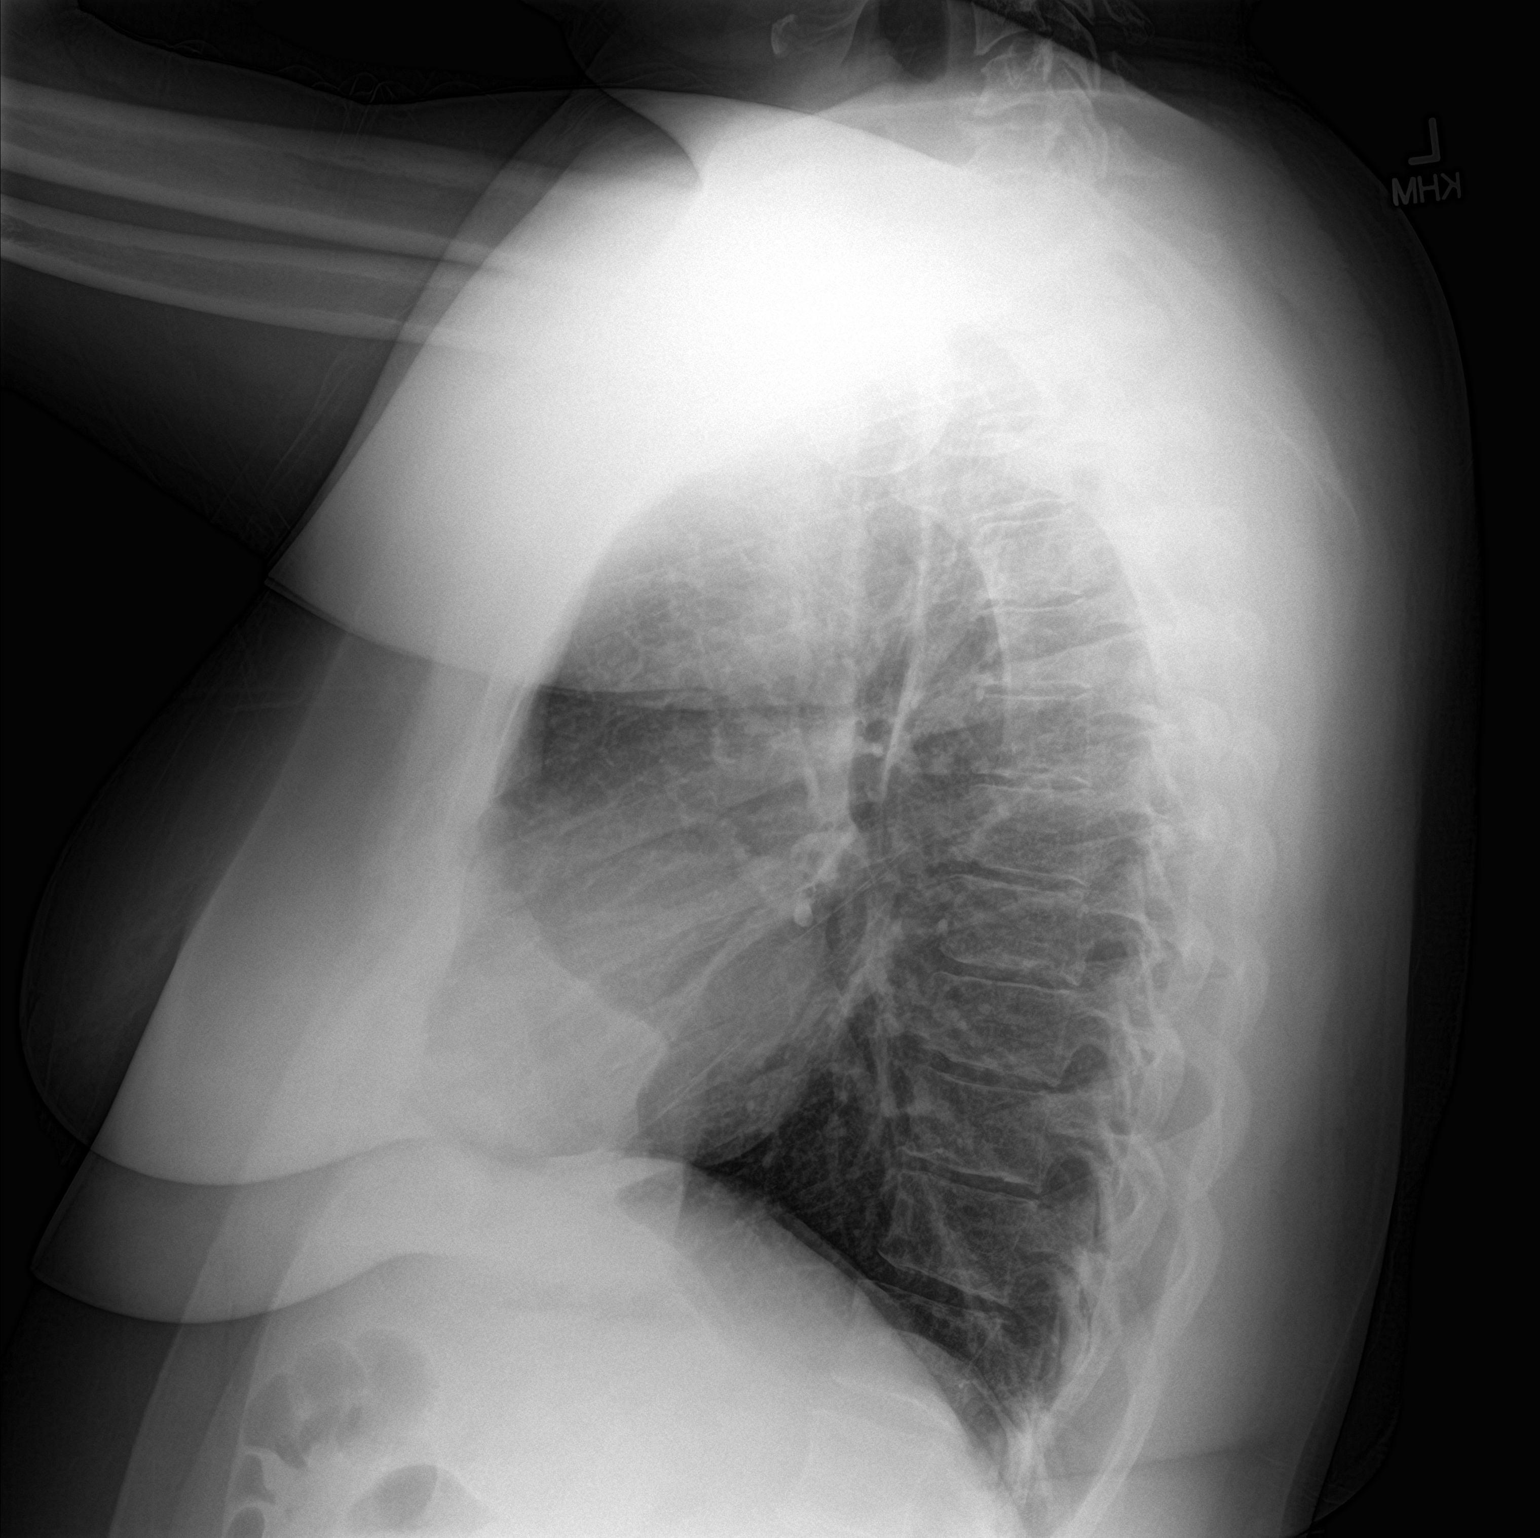

[2 of 2 positions shown; findings below may reference images not displayed]

FINDINGS: Mild bronchitic changes. No acute consolidation or effusion. Normal
cardiomediastinal silhouette. No pneumothorax.
IMPRESSION: No active cardiopulmonary disease.

## 2018-01-18 ENCOUNTER — Other Ambulatory Visit: Payer: Self-pay | Admitting: "Endocrinology

## 2018-01-30 ENCOUNTER — Other Ambulatory Visit (HOSPITAL_COMMUNITY): Payer: Self-pay | Admitting: Diagnostic Radiology

## 2018-01-31 DIAGNOSIS — M7711 Lateral epicondylitis, right elbow: Secondary | ICD-10-CM | POA: Diagnosis not present

## 2018-02-04 ENCOUNTER — Ambulatory Visit
Admission: RE | Admit: 2018-02-04 | Discharge: 2018-02-04 | Disposition: A | Discharge status: Home / Self Care | Payer: Medicaid Other | Source: Ambulatory Visit | Attending: Neurological Surgery | Admitting: Neurological Surgery

## 2018-02-04 DIAGNOSIS — M48061 Spinal stenosis, lumbar region without neurogenic claudication: Secondary | ICD-10-CM | POA: Diagnosis not present

## 2018-02-04 DIAGNOSIS — M4317 Spondylolisthesis, lumbosacral region: Secondary | ICD-10-CM

## 2018-02-05 ENCOUNTER — Other Ambulatory Visit: Payer: Self-pay | Admitting: Neurological Surgery

## 2018-02-05 DIAGNOSIS — S32009K Unspecified fracture of unspecified lumbar vertebra, subsequent encounter for fracture with nonunion: Secondary | ICD-10-CM | POA: Diagnosis not present

## 2018-02-05 DIAGNOSIS — M461 Sacroiliitis, not elsewhere classified: Secondary | ICD-10-CM | POA: Diagnosis not present

## 2018-02-07 DIAGNOSIS — M461 Sacroiliitis, not elsewhere classified: Secondary | ICD-10-CM | POA: Diagnosis not present

## 2018-02-10 ENCOUNTER — Other Ambulatory Visit: Payer: Self-pay | Admitting: "Endocrinology

## 2018-02-10 ENCOUNTER — Other Ambulatory Visit: Payer: Self-pay | Admitting: Family Medicine

## 2018-02-12 DIAGNOSIS — M25511 Pain in right shoulder: Secondary | ICD-10-CM | POA: Diagnosis not present

## 2018-02-12 DIAGNOSIS — M7711 Lateral epicondylitis, right elbow: Secondary | ICD-10-CM | POA: Diagnosis not present

## 2018-02-19 NOTE — Pre-Procedure Instructions (Signed)
Teresa Vincent  02/19/2018      [facility] - [place], [place] - [address] [place] [address] Phone: [phone] Fax: [phone]    Your procedure is scheduled on Thursday, December 12th.  Report to [facility] Admitting at 5:30 A.M.  Call this number if you have problems the morning of surgery:  [phone]   Remember:  Do not eat or drink after midnight.    Take these medicines the morning of surgery with A SIP OF WATER  cyclobenzaprine (FLEXERIL) -as needed gabapentin (NEURONTIN)  atorvastatin (LIPITOR)  HYDROcodone-acetaminophen (NORCO)-as needed  levothyroxine (SYNTHROID, LEVOTHROID)  PROVENTIL HFA 108 (90 Base) MCG/ACT inhaler-if needed; please bring with you to the hospital.    7 days prior to surgery STOP taking any Aspirin(unless otherwise instructed by your surgeon), Aleve, Naproxen, Ibuprofen, Motrin, Advil, Goody's, BC's, all herbal medications, fish oil, and all vitamins.    Do not wear jewelry, make-up or nail polish.  Do not wear lotions, powders, or perfumes, or deodorant.  Do not shave 48 hours prior to surgery.    Do not bring valuables to the hospital.  [facility] is not responsible for any belongings or valuables.  Contacts, dentures or bridgework may not be worn into surgery.  Leave your suitcase in the car.  After surgery it may be brought to your room.  For patients admitted to the hospital, discharge time will be determined by your treatment team.  Patients discharged the day of surgery will not be allowed to drive home.   Special instructions:   [facility]- Preparing For Surgery  Before surgery, you can play an important role. Because skin is not sterile, your skin needs to be as free of germs as possible. You can reduce the number of germs on your skin by washing with CHG (chlorahexidine gluconate) Soap before surgery.  CHG is an antiseptic cleaner which kills germs and bonds with the  skin to continue killing germs even after washing.    Oral Hygiene is also important to reduce your risk of infection.  Remember - BRUSH YOUR TEETH THE MORNING OF SURGERY WITH YOUR REGULAR TOOTHPASTE  Please do not use if you have an allergy to CHG or antibacterial soaps. If your skin becomes reddened/irritated stop using the CHG.  Do not shave (including legs and underarms) for at least 48 hours prior to first CHG shower. It is OK to shave your face.  Please follow these instructions carefully.   1. Shower the NIGHT BEFORE SURGERY and the MORNING OF SURGERY with CHG.   2. If you chose to wash your hair, wash your hair first as usual with your normal shampoo.  3. After you shampoo, rinse your hair and body thoroughly to remove the shampoo.  4. Use CHG as you would any other liquid soap. You can apply CHG directly to the skin and wash gently with a scrungie or a clean washcloth.   5. Apply the CHG Soap to your body ONLY FROM THE NECK DOWN.  Do not use on open wounds or open sores. Avoid contact with your eyes, ears, mouth and genitals (private parts). Wash Face and genitals (private parts)  with your normal soap.  6. Wash thoroughly, paying special attention to the area where your surgery will be performed.  7. Thoroughly rinse your body with warm water from the neck down.  8. DO NOT shower/wash with your normal soap after using and rinsing off  the CHG Soap.  9. Pat yourself dry with a CLEAN TOWEL.  10. Wear CLEAN PAJAMAS to bed the night before surgery, wear comfortable clothes the morning of surgery  11. Place CLEAN SHEETS on your bed the night of your first shower and DO NOT SLEEP WITH PETS.    Day of Surgery:  Do not apply any deodorants/lotions.  Please wear clean clothes to the hospital/surgery center.   Remember to brush your teeth WITH YOUR REGULAR TOOTHPASTE.   Please read over the following fact sheets that you were given.

## 2018-02-20 ENCOUNTER — Encounter (HOSPITAL_COMMUNITY)
Admission: RE | Admit: 2018-02-20 | Discharge: 2018-02-20 | Disposition: A | Discharge status: Home / Self Care | Payer: 59 | Source: Ambulatory Visit | Attending: Neurological Surgery | Admitting: Neurological Surgery

## 2018-02-20 ENCOUNTER — Ambulatory Visit (HOSPITAL_COMMUNITY)
Admission: RE | Admit: 2018-02-20 | Discharge: 2018-02-20 | Disposition: A | Discharge status: Home / Self Care | Payer: 59 | Source: Ambulatory Visit | Attending: Neurological Surgery | Admitting: Neurological Surgery

## 2018-02-20 ENCOUNTER — Other Ambulatory Visit: Payer: Self-pay

## 2018-02-20 ENCOUNTER — Encounter (HOSPITAL_COMMUNITY): Payer: Self-pay

## 2018-02-20 DIAGNOSIS — S32009K Unspecified fracture of unspecified lumbar vertebra, subsequent encounter for fracture with nonunion: Secondary | ICD-10-CM | POA: Insufficient documentation

## 2018-02-20 DIAGNOSIS — Z01818 Encounter for other preprocedural examination: Secondary | ICD-10-CM | POA: Insufficient documentation

## 2018-02-20 DIAGNOSIS — R9431 Abnormal electrocardiogram [ECG] [EKG]: Secondary | ICD-10-CM | POA: Diagnosis not present

## 2018-02-20 HISTORY — DX: Unspecified asthma, uncomplicated: J45.909

## 2018-02-20 HISTORY — DX: Sleep apnea, unspecified: G47.30

## 2018-02-20 LAB — CBC WITH DIFFERENTIAL/PLATELET
ABS IMMATURE GRANULOCYTES: 0.03 10*3/uL (ref 0.00–0.07)
BASOS ABS: 0 10*3/uL (ref 0.0–0.1)
BASOS PCT: 0 %
EOS PCT: 2 %
Eosinophils Absolute: 0.2 10*3/uL (ref 0.0–0.5)
HCT: 45.5 % (ref 36.0–46.0)
Hemoglobin: 14.7 g/dL (ref 12.0–15.0)
Immature Granulocytes: 0 %
Lymphocytes Relative: 29 %
Lymphs Abs: 2.7 10*3/uL (ref 0.7–4.0)
MCH: 30 pg (ref 26.0–34.0)
MCHC: 32.3 g/dL (ref 30.0–36.0)
MCV: 92.9 fL (ref 80.0–100.0)
MONO ABS: 0.8 10*3/uL (ref 0.1–1.0)
Monocytes Relative: 8 %
NEUTROS ABS: 5.5 10*3/uL (ref 1.7–7.7)
NRBC: 0 % (ref 0.0–0.2)
Neutrophils Relative %: 61 %
PLATELETS: 255 10*3/uL (ref 150–400)
RBC: 4.9 MIL/uL (ref 3.87–5.11)
RDW: 12.6 % (ref 11.5–15.5)
WBC: 9.2 10*3/uL (ref 4.0–10.5)

## 2018-02-20 LAB — BASIC METABOLIC PANEL
Anion gap: 9 (ref 5–15)
BUN: 13 mg/dL (ref 6–20)
CO2: 25 mmol/L (ref 22–32)
Calcium: 9.1 mg/dL (ref 8.9–10.3)
Chloride: 104 mmol/L (ref 98–111)
Creatinine, Ser: 0.75 mg/dL (ref 0.44–1.00)
Glucose, Bld: 98 mg/dL (ref 70–99)
POTASSIUM: 4.1 mmol/L (ref 3.5–5.1)
Sodium: 138 mmol/L (ref 135–145)

## 2018-02-20 LAB — TYPE AND SCREEN
ABO/RH(D): O POS
Antibody Screen: NEGATIVE

## 2018-02-20 LAB — SURGICAL PCR SCREEN
MRSA, PCR: NEGATIVE
STAPHYLOCOCCUS AUREUS: NEGATIVE

## 2018-02-20 LAB — PROTIME-INR
INR: 0.89
PROTHROMBIN TIME: 12 s (ref 11.4–15.2)

## 2018-02-20 NOTE — Progress Notes (Signed)
PCP - Ardyth GalWilliam Luking MD  Chest x-ray - 02/20/18 EKG - 02/20/18  Sleep Study - 11/04/17 CPAP - told to bring mask and hose  Blood Thinner Instructions: N/A Aspirin Instructions: N/A  Anesthesia review: none  Patient denies shortness of breath, fever, cough and chest pain at PAT appointment   Patient verbalized understanding of instructions that were given to them at the PAT appointment. Patient was also instructed that they will need to review over the PAT instructions again at home before surgery.

## 2018-02-25 ENCOUNTER — Telehealth: Payer: Self-pay | Admitting: Family Medicine

## 2018-02-25 DIAGNOSIS — M25511 Pain in right shoulder: Secondary | ICD-10-CM | POA: Diagnosis not present

## 2018-02-25 MED ORDER — AZITHROMYCIN 250 MG PO TABS: ORAL_TABLET | ORAL | 0 refills | Status: DC

## 2018-02-25 NOTE — Telephone Encounter (Signed)
Pt woke up this morning sneezing, cough, losing voice. She is having back surgery on 12/12. She is wanting to know if an antibiotic can be given or something OTC that will help knock this out before her surgery on Thursday but not interfere with her being put to sleep for surgery. If something is able to be called in [facility] [place], [place] - [address]

## 2018-02-25 NOTE — Telephone Encounter (Signed)
Prescription sent electronically to pharmacy.  Patient notified and advised she may use Robitussin DM

## 2018-02-25 NOTE — Telephone Encounter (Signed)
Zpk, use otc rob dm

## 2018-02-27 NOTE — Anesthesia Preprocedure Evaluation (Addendum)
Anesthesia Evaluation   Patient awake    Reviewed: Allergy & Precautions, NPO status , Patient's Chart, lab work & pertinent test results  History of Anesthesia Complications Negative for: history of anesthetic complications  Airway Mallampati: II  TM Distance: >3 FB Neck ROM: Full    Dental  (+) Teeth Intact, Dental Advisory Given   Pulmonary asthma , sleep apnea , Current Smoker,    Pulmonary exam normal breath sounds clear to auscultation       Cardiovascular hypertension, Normal cardiovascular exam Rhythm:Regular Rate:Normal     Neuro/Psych  Headaches, Anxiety Sciatica CVA    GI/Hepatic Neg liver ROS, GERD  ,  Endo/Other  Hypothyroidism   Renal/GU negative Renal ROS     Musculoskeletal negative musculoskeletal ROS (+)   Abdominal   Peds  Hematology negative hematology ROS (+)   Anesthesia Other Findings Day of surgery medications reviewed with the patient.  Reproductive/Obstetrics                            Anesthesia Physical Anesthesia Plan  ASA: II  Anesthesia Plan: General   Post-op Pain Management:    Induction: Intravenous  PONV Risk Score and Plan: 3 and Treatment may vary due to age or medical condition, Ondansetron, Dexamethasone and Midazolam  Airway Management Planned: Oral ETT  Additional Equipment:   Intra-op Plan:   Post-operative Plan: Extubation in OR  Informed Consent: I have reviewed the patients History and Physical, chart, labs and discussed the procedure including the risks, benefits and alternatives for the proposed anesthesia with the patient or authorized representative who has indicated his/her understanding and acceptance.   Dental advisory given  Plan Discussed with: CRNA  Anesthesia Plan Comments:        Anesthesia Quick Evaluation

## 2018-02-28 ENCOUNTER — Inpatient Hospital Stay (HOSPITAL_COMMUNITY)
Admission: RE | Admit: 2018-02-28 | Discharge: 2018-02-28 | DRG: 460 | Disposition: A | Discharge status: Home / Self Care | Payer: 59 | Attending: Neurological Surgery | Admitting: Neurological Surgery

## 2018-02-28 ENCOUNTER — Inpatient Hospital Stay (HOSPITAL_COMMUNITY): Payer: 59 | Admitting: Anesthesiology

## 2018-02-28 ENCOUNTER — Inpatient Hospital Stay (HOSPITAL_COMMUNITY): Payer: 59

## 2018-02-28 ENCOUNTER — Encounter (HOSPITAL_COMMUNITY)
Admission: RE | Disposition: A | Discharge status: Home / Self Care | Payer: Self-pay | Source: Home / Self Care | Attending: Neurological Surgery

## 2018-02-28 ENCOUNTER — Encounter (HOSPITAL_COMMUNITY): Payer: Self-pay

## 2018-02-28 ENCOUNTER — Other Ambulatory Visit: Payer: Self-pay

## 2018-02-28 DIAGNOSIS — E782 Mixed hyperlipidemia: Secondary | ICD-10-CM | POA: Diagnosis not present

## 2018-02-28 DIAGNOSIS — Z79899 Other long term (current) drug therapy: Secondary | ICD-10-CM | POA: Diagnosis not present

## 2018-02-28 DIAGNOSIS — Z79891 Long term (current) use of opiate analgesic: Secondary | ICD-10-CM | POA: Diagnosis not present

## 2018-02-28 DIAGNOSIS — J45909 Unspecified asthma, uncomplicated: Secondary | ICD-10-CM | POA: Diagnosis present

## 2018-02-28 DIAGNOSIS — M4807 Spinal stenosis, lumbosacral region: Secondary | ICD-10-CM | POA: Diagnosis not present

## 2018-02-28 DIAGNOSIS — Z8249 Family history of ischemic heart disease and other diseases of the circulatory system: Secondary | ICD-10-CM | POA: Diagnosis not present

## 2018-02-28 DIAGNOSIS — Z9049 Acquired absence of other specified parts of digestive tract: Secondary | ICD-10-CM

## 2018-02-28 DIAGNOSIS — Z981 Arthrodesis status: Secondary | ICD-10-CM

## 2018-02-28 DIAGNOSIS — Z90710 Acquired absence of both cervix and uterus: Secondary | ICD-10-CM

## 2018-02-28 DIAGNOSIS — K219 Gastro-esophageal reflux disease without esophagitis: Secondary | ICD-10-CM | POA: Diagnosis present

## 2018-02-28 DIAGNOSIS — I10 Essential (primary) hypertension: Secondary | ICD-10-CM | POA: Diagnosis not present

## 2018-02-28 DIAGNOSIS — M4317 Spondylolisthesis, lumbosacral region: Principal | ICD-10-CM | POA: Diagnosis present

## 2018-02-28 DIAGNOSIS — Z87442 Personal history of urinary calculi: Secondary | ICD-10-CM | POA: Diagnosis not present

## 2018-02-28 DIAGNOSIS — Z419 Encounter for procedure for purposes other than remedying health state, unspecified: Secondary | ICD-10-CM

## 2018-02-28 DIAGNOSIS — Z8673 Personal history of transient ischemic attack (TIA), and cerebral infarction without residual deficits: Secondary | ICD-10-CM | POA: Diagnosis not present

## 2018-02-28 DIAGNOSIS — F419 Anxiety disorder, unspecified: Secondary | ICD-10-CM | POA: Diagnosis present

## 2018-02-28 DIAGNOSIS — G4730 Sleep apnea, unspecified: Secondary | ICD-10-CM | POA: Diagnosis present

## 2018-02-28 DIAGNOSIS — Z79890 Hormone replacement therapy (postmenopausal): Secondary | ICD-10-CM

## 2018-02-28 DIAGNOSIS — E039 Hypothyroidism, unspecified: Secondary | ICD-10-CM | POA: Diagnosis not present

## 2018-02-28 DIAGNOSIS — G8929 Other chronic pain: Secondary | ICD-10-CM | POA: Diagnosis present

## 2018-02-28 DIAGNOSIS — F17210 Nicotine dependence, cigarettes, uncomplicated: Secondary | ICD-10-CM | POA: Diagnosis present

## 2018-02-28 DIAGNOSIS — M4316 Spondylolisthesis, lumbar region: Secondary | ICD-10-CM | POA: Diagnosis not present

## 2018-02-28 DIAGNOSIS — M960 Pseudarthrosis after fusion or arthrodesis: Secondary | ICD-10-CM | POA: Diagnosis not present

## 2018-02-28 HISTORY — PX: LAMINECTOMY WITH POSTERIOR LATERAL ARTHRODESIS LEVEL 1: SHX6335

## 2018-02-28 SURGERY — LAMINECTOMY WITH POSTERIOR LATERAL ARTHRODESIS LEVEL 1
Anesthesia: General | Site: Spine Lumbar

## 2018-02-28 MED ORDER — EPHEDRINE SULFATE-NACL 50-0.9 MG/10ML-% IV SOSY
PREFILLED_SYRINGE | INTRAVENOUS | Status: DC | PRN
  Administered 2018-02-28: 10 mg via INTRAVENOUS

## 2018-02-28 MED ORDER — SUCCINYLCHOLINE CHLORIDE 200 MG/10ML IV SOSY
PREFILLED_SYRINGE | INTRAVENOUS | Status: AC
  Filled 2018-02-28: qty 10

## 2018-02-28 MED ORDER — PANTOPRAZOLE SODIUM 40 MG PO TBEC: 40.0000 mg | DELAYED_RELEASE_TABLET | Freq: Every day | ORAL | Status: DC

## 2018-02-28 MED ORDER — ACETAMINOPHEN 650 MG RE SUPP: 650.0000 mg | RECTAL | Status: DC | PRN

## 2018-02-28 MED ORDER — VANCOMYCIN HCL 1000 MG IV SOLR
INTRAVENOUS | Status: AC
  Filled 2018-02-28: qty 1000

## 2018-02-28 MED ORDER — LEVOTHYROXINE SODIUM 75 MCG PO TABS: 175.0000 ug | ORAL_TABLET | Freq: Every day | ORAL | Status: DC

## 2018-02-28 MED ORDER — BUPIVACAINE HCL (PF) 0.25 % IJ SOLN
INTRAMUSCULAR | Status: AC
  Filled 2018-02-28: qty 30

## 2018-02-28 MED ORDER — SODIUM CHLORIDE 0.9% FLUSH: 3.0000 mL | Freq: Two times a day (BID) | INTRAVENOUS | Status: DC

## 2018-02-28 MED ORDER — GLYCOPYRROLATE 0.2 MG/ML IJ SOLN
INTRAMUSCULAR | Status: DC | PRN
  Administered 2018-02-28: 0.1 mg via INTRAVENOUS

## 2018-02-28 MED ORDER — ACETAMINOPHEN 10 MG/ML IV SOLN
INTRAVENOUS | Status: AC
  Filled 2018-02-28: qty 100

## 2018-02-28 MED ORDER — HYDROMORPHONE HCL 1 MG/ML IJ SOLN
INTRAMUSCULAR | Status: AC
  Administered 2018-02-28: 0.5 mg via INTRAVENOUS
  Filled 2018-02-28: qty 1

## 2018-02-28 MED ORDER — PROPOFOL 10 MG/ML IV BOLUS
INTRAVENOUS | Status: DC | PRN
  Administered 2018-02-28: 180 mg via INTRAVENOUS

## 2018-02-28 MED ORDER — SODIUM CHLORIDE 0.9 % IV SOLN: 250.0000 mL | INTRAVENOUS | Status: DC

## 2018-02-28 MED ORDER — CHLORHEXIDINE GLUCONATE CLOTH 2 % EX PADS: 6.0000 | MEDICATED_PAD | Freq: Once | CUTANEOUS | Status: DC

## 2018-02-28 MED ORDER — EPHEDRINE 5 MG/ML INJ
INTRAVENOUS | Status: AC
  Filled 2018-02-28: qty 20

## 2018-02-28 MED ORDER — OXYCODONE HCL 5 MG/5ML PO SOLN: 5.0000 mg | Freq: Once | ORAL | Status: AC | PRN

## 2018-02-28 MED ORDER — CYCLOBENZAPRINE HCL 10 MG PO TABS: 10.0000 mg | ORAL_TABLET | Freq: Three times a day (TID) | ORAL | 1 refills | Status: DC | PRN

## 2018-02-28 MED ORDER — 0.9 % SODIUM CHLORIDE (POUR BTL) OPTIME
TOPICAL | Status: DC | PRN
  Administered 2018-02-28: 1000 mL

## 2018-02-28 MED ORDER — ACETAMINOPHEN 10 MG/ML IV SOLN
INTRAVENOUS | Status: DC | PRN
  Administered 2018-02-28: 1000 mg via INTRAVENOUS

## 2018-02-28 MED ORDER — LISINOPRIL 10 MG PO TABS: 10.0000 mg | ORAL_TABLET | Freq: Every day | ORAL | Status: DC

## 2018-02-28 MED ORDER — ONDANSETRON HCL 4 MG PO TABS: 4.0000 mg | ORAL_TABLET | Freq: Four times a day (QID) | ORAL | Status: DC | PRN

## 2018-02-28 MED ORDER — MIDAZOLAM HCL 2 MG/2ML IJ SOLN
INTRAMUSCULAR | Status: AC
  Filled 2018-02-28: qty 2

## 2018-02-28 MED ORDER — CEFAZOLIN SODIUM-DEXTROSE 2-4 GM/100ML-% IV SOLN
2.0000 g | Freq: Three times a day (TID) | INTRAVENOUS | Status: DC
  Administered 2018-02-28: 2 g via INTRAVENOUS
  Filled 2018-02-28: qty 100

## 2018-02-28 MED ORDER — CYCLOBENZAPRINE HCL 10 MG PO TABS: 10.0000 mg | ORAL_TABLET | Freq: Three times a day (TID) | ORAL | Status: DC | PRN

## 2018-02-28 MED ORDER — DEXAMETHASONE SODIUM PHOSPHATE 10 MG/ML IJ SOLN
INTRAMUSCULAR | Status: DC | PRN
  Administered 2018-02-28: 10 mg via INTRAVENOUS

## 2018-02-28 MED ORDER — CELECOXIB 200 MG PO CAPS
200.0000 mg | ORAL_CAPSULE | Freq: Two times a day (BID) | ORAL | Status: DC
  Administered 2018-02-28: 200 mg via ORAL
  Filled 2018-02-28: qty 1

## 2018-02-28 MED ORDER — SENNA 8.6 MG PO TABS: 1.0000 | ORAL_TABLET | Freq: Two times a day (BID) | ORAL | Status: DC

## 2018-02-28 MED ORDER — FENTANYL CITRATE (PF) 250 MCG/5ML IJ SOLN
INTRAMUSCULAR | Status: AC
  Filled 2018-02-28: qty 5

## 2018-02-28 MED ORDER — THROMBIN 20000 UNITS EX SOLR
CUTANEOUS | Status: DC | PRN
  Administered 2018-02-28: 20 mL via TOPICAL

## 2018-02-28 MED ORDER — OXYCODONE HCL 5 MG PO TABS
ORAL_TABLET | ORAL | Status: AC
  Filled 2018-02-28: qty 1

## 2018-02-28 MED ORDER — FENTANYL CITRATE (PF) 250 MCG/5ML IJ SOLN
INTRAMUSCULAR | Status: DC | PRN
  Administered 2018-02-28 (×8): 50 ug via INTRAVENOUS

## 2018-02-28 MED ORDER — BUPIVACAINE HCL (PF) 0.25 % IJ SOLN
INTRAMUSCULAR | Status: DC | PRN
  Administered 2018-02-28: 10 mL

## 2018-02-28 MED ORDER — LACTATED RINGERS IV SOLN
INTRAVENOUS | Status: DC | PRN
  Administered 2018-02-28: 07:00:00 via INTRAVENOUS

## 2018-02-28 MED ORDER — ONDANSETRON HCL 4 MG/2ML IJ SOLN
INTRAMUSCULAR | Status: AC
  Filled 2018-02-28: qty 4

## 2018-02-28 MED ORDER — THROMBIN 20000 UNITS EX SOLR
CUTANEOUS | Status: AC
  Filled 2018-02-28: qty 20000

## 2018-02-28 MED ORDER — MIDAZOLAM HCL 2 MG/2ML IJ SOLN
INTRAMUSCULAR | Status: DC | PRN
  Administered 2018-02-28: 2 mg via INTRAVENOUS

## 2018-02-28 MED ORDER — THROMBIN 5000 UNITS EX SOLR
CUTANEOUS | Status: AC
  Filled 2018-02-28: qty 5000

## 2018-02-28 MED ORDER — SODIUM CHLORIDE 0.9 % IV SOLN
INTRAVENOUS | Status: DC | PRN
  Administered 2018-02-28: 500 mL

## 2018-02-28 MED ORDER — DEXAMETHASONE SODIUM PHOSPHATE 10 MG/ML IJ SOLN
INTRAMUSCULAR | Status: AC
  Filled 2018-02-28: qty 2

## 2018-02-28 MED ORDER — PHENYLEPHRINE 40 MCG/ML (10ML) SYRINGE FOR IV PUSH (FOR BLOOD PRESSURE SUPPORT)
PREFILLED_SYRINGE | INTRAVENOUS | Status: AC
  Filled 2018-02-28: qty 20

## 2018-02-28 MED ORDER — HYDROMORPHONE HCL 1 MG/ML IJ SOLN
0.5000 mg | INTRAMUSCULAR | Status: DC | PRN
  Administered 2018-02-28: 0.5 mg via INTRAVENOUS
  Filled 2018-02-28: qty 0.5

## 2018-02-28 MED ORDER — ACETAMINOPHEN 325 MG PO TABS: 650.0000 mg | ORAL_TABLET | ORAL | Status: DC | PRN

## 2018-02-28 MED ORDER — ONDANSETRON HCL 4 MG/2ML IJ SOLN
INTRAMUSCULAR | Status: DC | PRN
  Administered 2018-02-28: 4 mg via INTRAVENOUS

## 2018-02-28 MED ORDER — MENTHOL 3 MG MT LOZG: 1.0000 | LOZENGE | OROMUCOSAL | Status: DC | PRN

## 2018-02-28 MED ORDER — HYDROMORPHONE HCL 1 MG/ML IJ SOLN
0.2500 mg | INTRAMUSCULAR | Status: DC | PRN
  Administered 2018-02-28 (×4): 0.5 mg via INTRAVENOUS

## 2018-02-28 MED ORDER — OXYCODONE HCL 5 MG PO TABS
5.0000 mg | ORAL_TABLET | ORAL | Status: DC | PRN
  Administered 2018-02-28 (×2): 5 mg via ORAL
  Filled 2018-02-28 (×2): qty 1

## 2018-02-28 MED ORDER — ROCURONIUM BROMIDE 10 MG/ML (PF) SYRINGE
PREFILLED_SYRINGE | INTRAVENOUS | Status: DC | PRN
  Administered 2018-02-28: 20 mg via INTRAVENOUS
  Administered 2018-02-28: 10 mg via INTRAVENOUS
  Administered 2018-02-28: 30 mg via INTRAVENOUS
  Administered 2018-02-28: 10 mg via INTRAVENOUS
  Administered 2018-02-28: 50 mg via INTRAVENOUS

## 2018-02-28 MED ORDER — GLYCOPYRROLATE PF 0.2 MG/ML IJ SOSY
PREFILLED_SYRINGE | INTRAMUSCULAR | Status: AC
  Filled 2018-02-28: qty 1

## 2018-02-28 MED ORDER — VANCOMYCIN HCL 1000 MG IV SOLR
INTRAVENOUS | Status: DC | PRN
  Administered 2018-02-28: 1000 mg via TOPICAL

## 2018-02-28 MED ORDER — SUGAMMADEX SODIUM 200 MG/2ML IV SOLN
INTRAVENOUS | Status: DC | PRN
  Administered 2018-02-28: 200 mg via INTRAVENOUS

## 2018-02-28 MED ORDER — LIDOCAINE 2% (20 MG/ML) 5 ML SYRINGE
INTRAMUSCULAR | Status: AC
  Filled 2018-02-28: qty 10

## 2018-02-28 MED ORDER — CEFAZOLIN SODIUM-DEXTROSE 2-4 GM/100ML-% IV SOLN
2.0000 g | INTRAVENOUS | Status: AC
  Administered 2018-02-28: 2 g via INTRAVENOUS
  Filled 2018-02-28: qty 100

## 2018-02-28 MED ORDER — ROCURONIUM BROMIDE 50 MG/5ML IV SOSY
PREFILLED_SYRINGE | INTRAVENOUS | Status: AC
  Filled 2018-02-28: qty 20

## 2018-02-28 MED ORDER — PHENOL 1.4 % MT LIQD: 1.0000 | OROMUCOSAL | Status: DC | PRN

## 2018-02-28 MED ORDER — BUPIVACAINE HCL (PF) 0.25 % IJ SOLN
INTRAMUSCULAR | Status: DC | PRN
  Administered 2018-02-28: 5 mL

## 2018-02-28 MED ORDER — SODIUM CHLORIDE 0.9% FLUSH: 3.0000 mL | INTRAVENOUS | Status: DC | PRN

## 2018-02-28 MED ORDER — ALBUTEROL SULFATE HFA 108 (90 BASE) MCG/ACT IN AERS: 1.0000 | INHALATION_SPRAY | RESPIRATORY_TRACT | Status: DC | PRN

## 2018-02-28 MED ORDER — POTASSIUM CHLORIDE IN NACL 20-0.9 MEQ/L-% IV SOLN: INTRAVENOUS | Status: DC

## 2018-02-28 MED ORDER — ACETAMINOPHEN 10 MG/ML IV SOLN
1000.0000 mg | Freq: Once | INTRAVENOUS | Status: DC | PRN
  Administered 2018-02-28: 1000 mg via INTRAVENOUS

## 2018-02-28 MED ORDER — ONDANSETRON HCL 4 MG/2ML IJ SOLN: 4.0000 mg | Freq: Four times a day (QID) | INTRAMUSCULAR | Status: DC | PRN

## 2018-02-28 MED ORDER — AZITHROMYCIN 250 MG PO TABS
250.0000 mg | ORAL_TABLET | Freq: Every day | ORAL | Status: DC
  Filled 2018-02-28: qty 1

## 2018-02-28 MED ORDER — PROPOFOL 10 MG/ML IV BOLUS
INTRAVENOUS | Status: AC
  Filled 2018-02-28: qty 20

## 2018-02-28 MED ORDER — PROMETHAZINE HCL 25 MG/ML IJ SOLN: 6.2500 mg | INTRAMUSCULAR | Status: DC | PRN

## 2018-02-28 MED ORDER — DEXAMETHASONE SODIUM PHOSPHATE 10 MG/ML IJ SOLN
10.0000 mg | INTRAMUSCULAR | Status: DC
  Filled 2018-02-28: qty 1

## 2018-02-28 MED ORDER — PHENYLEPHRINE 40 MCG/ML (10ML) SYRINGE FOR IV PUSH (FOR BLOOD PRESSURE SUPPORT)
PREFILLED_SYRINGE | INTRAVENOUS | Status: DC | PRN
  Administered 2018-02-28 (×2): 40 ug via INTRAVENOUS

## 2018-02-28 MED ORDER — OXYCODONE HCL 10 MG PO TABS: 5.0000 mg | ORAL_TABLET | Freq: Four times a day (QID) | ORAL | 0 refills | Status: DC | PRN

## 2018-02-28 MED ORDER — GABAPENTIN 300 MG PO CAPS
600.0000 mg | ORAL_CAPSULE | Freq: Three times a day (TID) | ORAL | Status: DC
  Administered 2018-02-28: 600 mg via ORAL
  Filled 2018-02-28: qty 2

## 2018-02-28 MED ORDER — LIDOCAINE 2% (20 MG/ML) 5 ML SYRINGE
INTRAMUSCULAR | Status: DC | PRN
  Administered 2018-02-28: 100 mg via INTRAVENOUS

## 2018-02-28 MED ORDER — THROMBIN 5000 UNITS EX SOLR
OROMUCOSAL | Status: DC | PRN
  Administered 2018-02-28: 5 mL via TOPICAL

## 2018-02-28 MED ORDER — OXYCODONE HCL 5 MG PO TABS
5.0000 mg | ORAL_TABLET | Freq: Once | ORAL | Status: AC | PRN
  Administered 2018-02-28: 5 mg via ORAL

## 2018-02-28 MED ORDER — KETOROLAC TROMETHAMINE 30 MG/ML IJ SOLN
INTRAMUSCULAR | Status: DC | PRN
  Administered 2018-02-28: 30 mg via INTRAVENOUS

## 2018-02-28 SURGICAL SUPPLY — 68 items
ADH SKN CLS APL DERMABOND .7 (GAUZE/BANDAGES/DRESSINGS) ×1
APL SKNCLS STERI-STRIP NONHPOA (GAUZE/BANDAGES/DRESSINGS) ×1
BAG DECANTER FOR FLEXI CONT (MISCELLANEOUS) ×3 IMPLANT
BASKET BONE COLLECTION (BASKET) ×2 IMPLANT
BENZOIN TINCTURE PRP APPL 2/3 (GAUZE/BANDAGES/DRESSINGS) ×3 IMPLANT
BLADE CLIPPER SURG (BLADE) IMPLANT
BUR MATCHSTICK NEURO 3.0 LAGG (BURR) ×3 IMPLANT
CANISTER SUCT 3000ML PPV (MISCELLANEOUS) ×3 IMPLANT
CARTRIDGE OIL MAESTRO DRILL (MISCELLANEOUS) ×1 IMPLANT
CLOSURE WOUND 1/2 X4 (GAUZE/BANDAGES/DRESSINGS) ×2
CONT SPEC 4OZ CLIKSEAL STRL BL (MISCELLANEOUS) ×3 IMPLANT
COVER BACK TABLE 60X90IN (DRAPES) ×3 IMPLANT
COVER WAND RF STERILE (DRAPES) ×1 IMPLANT
DERMABOND ADVANCED (GAUZE/BANDAGES/DRESSINGS) ×2
DERMABOND ADVANCED .7 DNX12 (GAUZE/BANDAGES/DRESSINGS) IMPLANT
DIFFUSER DRILL AIR PNEUMATIC (MISCELLANEOUS) ×3 IMPLANT
DRAPE C-ARM 42X72 X-RAY (DRAPES) ×2 IMPLANT
DRAPE LAPAROTOMY 100X72X124 (DRAPES) ×3 IMPLANT
DRAPE POUCH INSTRU U-SHP 10X18 (DRAPES) ×1 IMPLANT
DRAPE SURG 17X23 STRL (DRAPES) ×3 IMPLANT
DRSG OPSITE POSTOP 4X6 (GAUZE/BANDAGES/DRESSINGS) ×2 IMPLANT
DURAPREP 26ML APPLICATOR (WOUND CARE) ×3 IMPLANT
ELECT REM PT RETURN 9FT ADLT (ELECTROSURGICAL) ×3
ELECTRODE REM PT RTRN 9FT ADLT (ELECTROSURGICAL) ×1 IMPLANT
EVACUATOR 1/8 PVC DRAIN (DRAIN) IMPLANT
GAUZE 4X4 16PLY RFD (DISPOSABLE) IMPLANT
GLOVE BIO SURGEON STRL SZ7 (GLOVE) ×4 IMPLANT
GLOVE BIO SURGEON STRL SZ8 (GLOVE) ×6 IMPLANT
GLOVE BIOGEL PI IND STRL 7.0 (GLOVE) IMPLANT
GLOVE BIOGEL PI IND STRL 7.5 (GLOVE) IMPLANT
GLOVE BIOGEL PI IND STRL 8 (GLOVE) IMPLANT
GLOVE BIOGEL PI IND STRL 8.5 (GLOVE) IMPLANT
GLOVE BIOGEL PI INDICATOR 7.0 (GLOVE) ×10
GLOVE BIOGEL PI INDICATOR 7.5 (GLOVE) ×4
GLOVE BIOGEL PI INDICATOR 8 (GLOVE) ×6
GLOVE BIOGEL PI INDICATOR 8.5 (GLOVE) ×2
GLOVE ECLIPSE 7.5 STRL STRAW (GLOVE) ×8 IMPLANT
GOWN STRL REUS W/ TWL LRG LVL3 (GOWN DISPOSABLE) IMPLANT
GOWN STRL REUS W/ TWL XL LVL3 (GOWN DISPOSABLE) ×2 IMPLANT
GOWN STRL REUS W/TWL 2XL LVL3 (GOWN DISPOSABLE) IMPLANT
GOWN STRL REUS W/TWL LRG LVL3 (GOWN DISPOSABLE) ×3
GOWN STRL REUS W/TWL XL LVL3 (GOWN DISPOSABLE) ×15
HEMOSTAT POWDER KIT SURGIFOAM (HEMOSTASIS) ×2 IMPLANT
KIT BASIN OR (CUSTOM PROCEDURE TRAY) ×3 IMPLANT
KIT TURNOVER KIT B (KITS) ×3 IMPLANT
MILL MEDIUM DISP (BLADE) ×2 IMPLANT
NDL HYPO 25X1 1.5 SAFETY (NEEDLE) ×1 IMPLANT
NEEDLE HYPO 25X1 1.5 SAFETY (NEEDLE) ×3 IMPLANT
NS IRRIG 1000ML POUR BTL (IV SOLUTION) ×3 IMPLANT
OIL CARTRIDGE MAESTRO DRILL (MISCELLANEOUS) ×3
PACK LAMINECTOMY NEURO (CUSTOM PROCEDURE TRAY) ×3 IMPLANT
PAD ARMBOARD 7.5X6 YLW CONV (MISCELLANEOUS) ×13 IMPLANT
ROD LORDOTIC TI 5.5X40 KODIAK (Rod) ×4 IMPLANT
SCREW ILIAC PA 5.5X40 (Screw) ×4 IMPLANT
SCREW KODIAK 6.5X40 (Screw) ×4 IMPLANT
SET SCREW (Screw) ×12 IMPLANT
SET SCREW SPNE (Screw) IMPLANT
SPONGE LAP 4X18 RFD (DISPOSABLE) IMPLANT
SPONGE SURGIFOAM ABS GEL 100 (HEMOSTASIS) ×3 IMPLANT
STRIP CLOSURE SKIN 1/2X4 (GAUZE/BANDAGES/DRESSINGS) ×4 IMPLANT
SUT VIC AB 0 CT1 18XCR BRD8 (SUTURE) ×1 IMPLANT
SUT VIC AB 0 CT1 8-18 (SUTURE) ×3
SUT VIC AB 2-0 CP2 18 (SUTURE) ×3 IMPLANT
SUT VIC AB 3-0 SH 8-18 (SUTURE) ×6 IMPLANT
TOWEL GREEN STERILE (TOWEL DISPOSABLE) ×3 IMPLANT
TOWEL GREEN STERILE FF (TOWEL DISPOSABLE) ×3 IMPLANT
TRAY FOLEY MTR SLVR 16FR STAT (SET/KITS/TRAYS/PACK) ×2 IMPLANT
WATER STERILE IRR 1000ML POUR (IV SOLUTION) ×3 IMPLANT

## 2018-02-28 NOTE — Transfer of Care (Signed)
Immediate Anesthesia Transfer of Care Note  Patient: Teresa Vincent  Procedure(s) Performed: Laminectomy and Foraminotomy Lumbar Five-Sacral One, posterolateral fusion and fixation (N/A Spine Lumbar)  Patient Location: PACU  Anesthesia Type:General  Level of Consciousness: awake, alert , oriented and patient cooperative  Airway & Oxygen Therapy: Patient Spontanous Breathing and Patient connected to nasal cannula oxygen  Post-op Assessment: Report given to RN, Post -op Vital signs reviewed and stable and Patient moving all extremities X 4  Post vital signs: Reviewed and stable  Last Vitals:  Vitals Value Taken Time  BP 136/79 02/28/2018 11:00 AM  Temp 36.5 C 02/28/2018 11:00 AM  Pulse 82 02/28/2018 11:02 AM  Resp 16 02/28/2018 11:02 AM  SpO2 99 % 02/28/2018 11:02 AM  Vitals shown include unvalidated device data.  Last Pain:  Vitals:   02/28/18 0622  TempSrc: Oral  PainSc:       Patients Stated Pain Goal: 2 (02/28/18 [id number])  Complications: No apparent anesthesia complications

## 2018-02-28 NOTE — Op Note (Signed)
02/28/2018  10:52 AM  PATIENT:  Teresa Vincent  37 y.o. female  PRE-OPERATIVE DIAGNOSIS: Lytic spondylolisthesis L5-S1, L5-S1 pseudoarthrosis, bilateral foraminal stenosis, back and leg pain  POST-OPERATIVE DIAGNOSIS:  same  PROCEDURE:   1. Decompressive lumbar laminectomy (gill type) L5-S1 decompress the neural elements 2. Posterior fixation L5-S1 using Alphatec cortical pedicle screws.  3. Intertransverse arthrodesis L5 S1 using morcellized autograft   SURGEON:  Marikay Alaravid Kunta Hilleary, MD  ASSISTANTS: Dr. Venetia MaxonStern  ANESTHESIA:  General  EBL: 125 ml  Total I/O In: 1000 [I.V.:1000] Out: 625 [Urine:500; Blood:125]  BLOOD ADMINISTERED:none  DRAINS: none   INDICATION FOR PROCEDURE: This patient presented with back and leg pain 1 year after anterior lumbar interbody fusion L5-S1 for lytic spondylolisthesis. Imaging revealed pseudo-arthrosis L5-S1 with bilateral foraminal stenosis. The patient tried a reasonable attempt at conservative medical measures without relief. I recommended decompression and instrumented fusion to address the stenosis as well as the segmental  instability.  Patient understood the risks, benefits, and alternatives and potential outcomes and wished to proceed.  PROCEDURE DETAILS:  The patient was brought to the operating room. After induction of generalized endotracheal anesthesia the patient was rolled into the prone position on chest rolls and all pressure points were padded. The patient's lumbar region was cleaned and then prepped with DuraPrep and draped in the usual sterile fashion. Anesthesia was injected and then a dorsal midline incision was made and carried down to the lumbosacral fascia. The fascia was opened and the paraspinous musculature was taken down in a subperiosteal fashion to expose L5-S1. A self-retaining retractor was placed. Intraoperative fluoroscopy confirmed my level.   I then turned my attention to the decompression and complete lumbar  laminectomies, hemi- facetectomies, and foraminotomies were performed at L5-S1. The patient had significant spinal stenosis and this required more work than would be required for a simple exposure of the disc for posterior lumbar interbody fusion which would only require a limited laminotomy. Much more generous decompression and generous foraminotomy was undertaken in order to adequately decompress the neural elements and address the patient's leg pain. The yellow ligament was removed to expose the underlying dura and nerve roots, and generous foraminotomies were performed to adequately decompress the neural elements. Both the exiting and traversing nerve roots were decompressed on both sides until a coronary dilator passed easily along the nerve roots. Once the decompression was complete, I turned my attention to the placement of the pedicle screws.  . The pedicle screw entry zones were identified utilizing surface landmarks and fluoroscopy. I drilled into each pedicle utilizing the hand drill, and tapped each pedicle with the appropriate tap. We palpated with a ball probe to assure no break in the cortex. We then placed 5.5 x 40 mm cortical pedicle screws at L5 and placed 6.5 x 40 mm pedicle screws into the pedicles bilaterally at S1. We then decorticated the transverse processes and laid a mixture of morcellized autograft and allograft out over these to perform intertransverse arthrodesis at 5 S1. We then placed lordotic rods into the multiaxial screw heads of the pedicle screws and locked these in position with the locking caps and anti-torque device. We then checked our construct with AP and lateral fluoroscopy. Irrigated with copious amounts of bacitracin-containing saline solution. Inspected the nerve roots once again to assure adequate decompression, lined to the dura with Gelfoam, placed powdered vancomycin into the wound, and closed the muscle and the fascia with 0 Vicryl. Closed the subcutaneous tissues  with 2-0 Vicryl and subcuticular  tissues with 3-0 Vicryl. The skin was closed with benzoin and Steri-Strips. Dressing was then applied, the patient was awakened from general anesthesia and transported to the recovery room in stable condition. At the end of the procedure all sponge, needle and instrument counts were correct.   PLAN OF CARE: admit to inpatient  PATIENT DISPOSITION:  PACU - hemodynamically stable.   Delay start of Pharmacological VTE agent (>24hrs) due to surgical blood loss or risk of bleeding:  yes

## 2018-02-28 NOTE — Anesthesia Procedure Notes (Signed)
Procedure Name: Intubation Date/Time: 02/28/2018 7:53 AM Performed by: Julieta Bellini, CRNA Pre-anesthesia Checklist: Patient identified, Emergency Drugs available, Suction available and Patient being monitored Patient Re-evaluated:Patient Re-evaluated prior to induction Oxygen Delivery Method: Circle system utilized Preoxygenation: Pre-oxygenation with 100% oxygen Induction Type: IV induction Ventilation: Mask ventilation without difficulty Laryngoscope Size: Mac and 3 Grade View: Grade I Tube type: Oral Tube size: 7.0 mm Number of attempts: 1 Airway Equipment and Method: Stylet Placement Confirmation: ETT inserted through vocal cords under direct vision,  positive ETCO2 and breath sounds checked- equal and bilateral Secured at: 21 cm Tube secured with: Tape Dental Injury: Teeth and Oropharynx as per pre-operative assessment

## 2018-02-28 NOTE — Progress Notes (Signed)
Orthopedic Tech Progress Note Patient Details:  Teresa Vincent Spampinato 12/31/1980 [id number] [employer] was called and brace was ordered. Patient ID: Teresa Vincent Cheever, female   DOB: 06/13/1980, 37 y.o.   MRN: [id number]   Tawni CarnesHarrison, Brad Mcgaughy [facility] 02/28/2018, 2:16 PM

## 2018-02-28 NOTE — H&P (Signed)
Subjective: Patient is a 37 y.o. female admitted for pseudoarthrosis. Onset of symptoms was several months ago, gradually worsening since that time.  The pain is rated intense, and is located at the across the lower back and radiates to legs. The pain is described as aching and occurs all day. The symptoms have been progressive. Symptoms are exacerbated by exercise. MRI or CT showed pseudoarthrosis   Past Medical History:  Diagnosis Date  . Abnormal Pap smear of cervix   . ANXIETY 08/30/2007  . Asthma   . Chronic back pain   . GERD (gastroesophageal reflux disease)   . History of kidney stones   . Hypertension   . 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 INSOMNIA 08/30/2007  . MIGRAINE HEADACHE 08/30/2007  . Neck pain   . Palpitations    occasionally couple times a week  . Sciatica   . Sleep apnea    cpap  . Stroke [facility](HCC)     Past Surgical History:  Procedure Laterality Date  . ABDOMINAL EXPOSURE N/A 05/29/2017   Procedure: ABDOMINAL EXPOSURE;  Surgeon: Chuck Hintickson, Christopher S, MD;  [facility];  Service: Vascular;  Laterality: N/A;  . ABDOMINAL HYSTERECTOMY    . ANTERIOR LUMBAR FUSION N/A 05/29/2017   Procedure: Anterior Lumbar Interbody Fusion  - Lumbar five sacral one;  Surgeon: Tia AlertJones, Marieke Lubke S, MD;  [facility];  Service: Neurosurgery;  Laterality: N/A;  . CARPAL TUNNEL RELEASE Bilateral   . CHOLECYSTECTOMY    . CHOLECYSTECTOMY N/A 12/29/2016   Procedure: LAPAROSCOPIC CHOLECYSTECTOMY;  Surgeon: Lucretia RoersBridges, Lindsay C, MD;  Location: [facility] ORS;  Service: General;  Laterality: N/A;  . LUMBAR EPIDURAL INJECTION    . STERIOD INJECTION N/A 01/10/2017   Procedure: MINOR EXPAREL INJECTION;  Surgeon: Lucretia RoersBridges, Lindsay C, MD;  Location: [facility] ORS;  Service: General;  Laterality: N/A;  . TOTAL ABDOMINAL HYSTERECTOMY W/ BILATERAL SALPINGOOPHORECTOMY N/A   . TUBAL LIGATION  2003    Prior to Admission medications   Medication Sig Start Date End Date Taking? Authorizing Provider   atorvastatin (LIPITOR) 20 MG tablet TAKE 1 TABLET BY MOUTH ONCE DAILY Patient taking differently: Take 20 mg by mouth daily.  02/11/18  Yes Nida, Denman GeorgeGebreselassie W, MD  azithromycin (ZITHROMAX Z-PAK) 250 MG tablet Take 2 tablets (500 mg) on  Day 1,  followed by 1 tablet (250 mg) once daily on Days 2 through 5. 02/25/18  Yes Luking, Vilinda BlanksWilliam S, MD  cyclobenzaprine (FLEXERIL) 10 MG tablet TAKE 1 TABLET BY MOUTH THREE TIMES DAILY AS NEEDED FOR MUSCLE SPASMS Patient taking differently: Take 10 mg by mouth 3 (three) times daily as needed for muscle spasms.  02/11/18  Yes Merlyn AlbertLuking, William S, MD  gabapentin (NEURONTIN) 300 MG capsule TAKE 1 CAPSULE BY MOUTH IN THE MORNING AND 1 CAPSULE IN THE AFTERNOON AND 2 CAPSULES AT BEDTIME Patient taking differently: Take 600 mg by mouth 3 (three) times daily.  09/03/17  Yes Campbell RichesHoskins, Wave C, NP  HYDROcodone-acetaminophen (NORCO) 10-325 MG tablet Take 1 tablet by mouth every 6 (six) hours as needed for moderate pain.   Yes [provider]  levothyroxine (SYNTHROID, LEVOTHROID) 175 MCG tablet Take 1 tablet (175 mcg total) by mouth daily before breakfast. 12/04/17  Yes Nida, Denman GeorgeGebreselassie W, MD  lisinopril (PRINIVIL,ZESTRIL) 10 MG tablet TAKE 1 TABLET BY MOUTH ONCE DAILY Patient taking differently: Take 10 mg by mouth daily.  01/21/18  Yes Nida, Denman GeorgeGebreselassie W, MD  ondansetron (ZOFRAN-ODT) 4 MG disintegrating tablet DISSOLVE 1 TABLET IN MOUTH EVERY 8 HOURS  AS NEEDED FOR NAUSEA AND VOMITING Patient taking differently: Take 4 mg by mouth every 8 (eight) hours as needed for nausea or vomiting.  11/01/17  Yes Luking, Jonna Coup, MD  pantoprazole (PROTONIX) 40 MG tablet 1 PO 30 MINUTES PRIOR TO MEALS QD Patient taking differently: Take 40 mg by mouth daily before breakfast. 30 MINUTES PRIOR TO MEAL 03/29/17  Yes Fields, Sandi L, MD  ALPRAZolam (XANAX) 0.5 MG tablet TAKE 0.5 MG BY MOUTH AT BEDTIME AS NEEDED FOR SLEEP Patient taking differently: Take 0.5 mg by mouth at bedtime  as needed for sleep.  10/23/17   [name], MD  oxyCODONE (OXY IR/ROXICODONE) 5 MG immediate release tablet Take 1-2 tablets (5-10 mg total) by mouth every 3 (three) hours as needed for moderate pain or severe pain ((score 4 to 6)). Patient not taking: Reported on 02/13/2018 05/30/17   Tia Alert, MD  PROVENTIL HFA 108 [phone] Base) MCG/ACT inhaler INHALE 1 TO 2 PUFFS INTO LUNGS EVERY 4 HOURS AS NEEDED FOR WHEEZING OR SHORTNESS OF BREATH Patient taking differently: Inhale 1-2 puffs into the lungs every 4 (four) hours as needed for wheezing or shortness of breath.  10/09/17   [name], MD   Allergies  Allergen Reactions  . Adhesive [Tape] Other (See Comments)    Skin irritation, patient prefers paper tape.   Budd Palmer [Citalopram Hydrobromide] Other (See Comments)    Make patient too carefree    Social History   Tobacco Use  . Smoking status: Current Every Day Smoker    Packs/day: 1.00    Years: 26.00    Pack years: 26.00    Types: Cigarettes  . Smokeless tobacco: Former Neurosurgeon  . Tobacco comment: currently 1 pack or less  Substance Use Topics  . Alcohol use: Yes    Alcohol/week: 0.0 standard drinks    Comment: occasional    Family History  Problem Relation Age of Onset  . Hypertension Mother   . Thyroid disease Neg Hx      Review of Systems  Positive ROS: neg  All other systems have been reviewed and were otherwise negative with the exception of those mentioned in the HPI and as above.  Objective: Vital signs in last 24 hours: Temp:  [97.7 F (36.5 C)] 97.7 F (36.5 C) (12/12 0622) Pulse Rate:  [67] 67 (12/12 0622) Resp:  [18] 18 (12/12 0622) BP: (132)/(87) 132/87 (12/12 0622) SpO2:  [100 %] 100 % (12/12 0622) Weight:  [103.3 kg] 103.3 kg (12/12 1096)  General Appearance: Alert, cooperative, no distress, appears stated age Head: Normocephalic, without obvious abnormality, atraumatic Eyes: PERRL, conjunctiva/corneas clear, EOM's intact    Neck: Supple,  symmetrical, trachea midline Back: Symmetric, no curvature, ROM normal, no CVA tenderness Lungs:  respirations unlabored Heart: Regular rate and rhythm Abdomen: Soft, non-tender Extremities: Extremities normal, atraumatic, no cyanosis or edema Pulses: 2+ and symmetric all extremities Skin: Skin color, texture, turgor normal, no rashes or lesions  NEUROLOGIC:   Mental status: Alert and oriented x4,  no aphasia, good attention span, fund of knowledge, and memory Motor Exam - grossly normal Sensory Exam - grossly normal Reflexes: 1= Coordination - grossly normal Gait - grossly normal Balance - grossly normal Cranial Nerves: I: smell Not tested  II: visual acuity  OS: nl    OD: nl  II: visual fields Full to confrontation  II: pupils Equal, round, reactive to light  III,VII: ptosis None  III,IV,VI: extraocular muscles  Full ROM  V:  mastication Normal  V: facial light touch sensation  Normal  V,VII: corneal reflex  Present  VII: facial muscle function - upper  Normal  VII: facial muscle function - lower Normal  VIII: hearing Not tested  IX: soft palate elevation  Normal  IX,X: gag reflex Present  XI: trapezius strength  5/5  XI: sternocleidomastoid strength 5/5  XI: neck flexion strength  5/5  XII: tongue strength  Normal    Data Review Lab Results  Component Value Date   WBC 9.2 02/20/2018   HGB 14.7 02/20/2018   HCT 45.5 02/20/2018   MCV 92.9 02/20/2018   PLT 255 02/20/2018   Lab Results  Component Value Date   NA 138 02/20/2018   K 4.1 02/20/2018   CL 104 02/20/2018   CO2 25 02/20/2018   BUN 13 02/20/2018   CREATININE 0.75 02/20/2018   GLUCOSE 98 02/20/2018   Lab Results  Component Value Date   INR 0.89 02/20/2018    Assessment/Plan:  Estimated body mass index is 35.68 kg/m as calculated from the following:   Height as of this encounter: 5\' 7"  (1.702 m).   Weight as of this encounter: 103.3 kg. Patient admitted for posterior fusion L5-S1. Patient has  failed a reasonable attempt at conservative therapy.  I explained the condition and procedure to the patient and answered any questions.  Patient wishes to proceed with procedure as planned. Understands risks/ benefits and typical outcomes of procedure.   Davide Risdon S 02/28/2018 7:42 AM

## 2018-02-28 NOTE — Discharge Summary (Signed)
Physician Discharge Summary  Patient ID: Teresa Vincent MRN: [id number] DOB/AGE: 08/30/1980 37 y.o.  Admit date: 02/28/2018 Discharge date: 02/28/2018  Admission Diagnoses: pseudoarthrosis l5-s1    Discharge Diagnoses: same   Discharged Condition: good  Hospital Course: The patient was admitted on 02/28/2018 and taken to the operating room where the patient underwent L5-S1 fusion. The patient tolerated the procedure well and was taken to the recovery room and then to the floor in stable condition. The hospital course was routine. There were no complications. The wound remained clean dry and intact. Pt had appropriate back soreness. No complaints of leg pain or new N/T/W. The patient remained afebrile with stable vital signs, and tolerated a regular diet. The patient continued to increase activities, and pain was well controlled with oral pain medications.   Consults: None  Significant Diagnostic Studies:  Results for orders placed or performed during the hospital encounter of 02/20/18  Surgical pcr screen  Result Value Ref Range   MRSA, PCR NEGATIVE NEGATIVE   Staphylococcus aureus NEGATIVE NEGATIVE  Basic metabolic panel  Result Value Ref Range   Sodium 138 135 - 145 mmol/L   Potassium 4.1 3.5 - 5.1 mmol/L   Chloride 104 98 - 111 mmol/L   CO2 25 22 - 32 mmol/L   Glucose, Bld 98 70 - 99 mg/dL   BUN 13 6 - 20 mg/dL   Creatinine, Ser 4.09 0.44 - 1.00 mg/dL   Calcium 9.1 8.9 - 81.1 mg/dL   GFR calc non Af Amer >60 >60 mL/min   GFR calc Af Amer >60 >60 mL/min   Anion gap 9 5 - 15  CBC WITH DIFFERENTIAL  Result Value Ref Range   WBC 9.2 4.0 - 10.5 K/uL   RBC 4.90 3.87 - 5.11 MIL/uL   Hemoglobin 14.7 12.0 - 15.0 g/dL   HCT 91.4 78.2 - 95.6 %   MCV 92.9 80.0 - 100.0 fL   MCH 30.0 26.0 - 34.0 pg   MCHC 32.3 30.0 - 36.0 g/dL   RDW 21.3 08.6 - 57.8 %   Platelets 255 150 - 400 K/uL   nRBC 0.0 0.0 - 0.2 %   Neutrophils Relative % 61 %   Neutro Abs 5.5 1.7 - 7.7 K/uL    Lymphocytes Relative 29 %   Lymphs Abs 2.7 0.7 - 4.0 K/uL   Monocytes Relative 8 %   Monocytes Absolute 0.8 0.1 - 1.0 K/uL   Eosinophils Relative 2 %   Eosinophils Absolute 0.2 0.0 - 0.5 K/uL   Basophils Relative 0 %   Basophils Absolute 0.0 0.0 - 0.1 K/uL   Immature Granulocytes 0 %   Abs Immature Granulocytes 0.03 0.00 - 0.07 K/uL  Protime-INR  Result Value Ref Range   Prothrombin Time 12.0 11.4 - 15.2 seconds   INR 0.89   Type and screen [facility]  Result Value Ref Range   ABO/RH(D) O POS    Antibody Screen NEG    Sample Expiration 03/06/2018    Extend sample reason      NO TRANSFUSIONS OR PREGNANCY IN THE PAST 3 MONTHS Performed at [facility], [address], [place], [place] [address]     Chest 2 View  Result Date: 02/20/2018 CLINICAL DATA:  Preoperative examination. Patient for lumbar fusion. EXAM: CHEST - 2 VIEW COMPARISON:  PA and lateral chest 12/10/2017. FINDINGS: Lungs clear. Heart size normal. No pneumothorax or pleural fluid. No acute or focal bony abnormality. IMPRESSION: Normal chest. Electronically  Signed   By: Drusilla Kanner M.D.   On: 02/20/2018 15:35   Dg Lumbar Spine 2-3 Views  Result Date: 02/28/2018 CLINICAL DATA:  Elective surgery EXAM: DG C-ARM 61-120 MIN; LUMBAR SPINE - 2-3 VIEW COMPARISON:  Lumbar spine CT 02/04/2018 FINDINGS: Five intraprocedural fluoroscopic images show placement of posterior rod and pedicle screw hardware at L5-S1. There is pre-existing ALIF hardware. No unexpected finding. IMPRESSION: Fluoroscopy for posterior instrumentation at L5-S1. Electronically Signed   By: Marnee Spring M.D.   On: 02/28/2018 10:37   Ct Lumbar Spine Wo Contrast  Result Date: 02/04/2018 CLINICAL DATA:  Lumbosacral spondylolisthesis. EXAM: CT LUMBAR SPINE WITHOUT CONTRAST TECHNIQUE: Multidetector CT imaging of the lumbar spine was performed without intravenous contrast administration. Multiplanar CT image reconstructions were also  generated. COMPARISON:  03/07/2017 myelography FINDINGS: Segmentation: 5 lumbar type vertebral bodies Alignment: Chronic grade 1 anterolisthesis at L5-S1, potentially increased from comparison CT. Vertebrae: Interval L5-S1 ALIF. No bridging bone is seen and there is prominent sclerosis in the adjacent vertebral bodies. There is confluent periosteal reaction anterior to the S1 vertebral body. Lucency seen around the S1 anchor, but not about the L5 anchors. No intervertebral cage displacement. No fracture. Paraspinal and other soft tissues: There is a degree of retroperitoneal stranding about the level of the intervertebral cage. Right renal calculi measuring up to 7 mm. Atherosclerotic calcification of the aorta and right iliacs, notable for age. Disc levels: L5-S1 chronic bilateral pars defects with interval fusion. Postoperative disc space is narrow without L5 impingement. This disc narrowing has progressed from intraoperative fluoroscopy 05/29/2017 due to cage subsidence. Patent canal. L3-4 and L4-5 disc narrowing and mild bulging without visible impingement. IMPRESSION: 1. L5-S1 interval ALIF with no solid arthrodesis. There is new sclerosis in the adjacent vertebrae, prominent periosteal reaction, and a degree of adjacent soft tissue edema. This could be from pseudoarthrosis and motion, but please correlate for laboratory signs of infection. 2. L5-S1 cage subsidence but the foramina remain patent. 3. Right nephrolithiasis. 4. Atherosclerosis, mild but notable for age. Electronically Signed   By: Marnee Spring M.D.   On: 02/04/2018 09:50   Dg C-arm 1-60 Min  Result Date: 02/28/2018 CLINICAL DATA:  Elective surgery EXAM: DG C-ARM 61-120 MIN; LUMBAR SPINE - 2-3 VIEW COMPARISON:  Lumbar spine CT 02/04/2018 FINDINGS: Five intraprocedural fluoroscopic images show placement of posterior rod and pedicle screw hardware at L5-S1. There is pre-existing ALIF hardware. No unexpected finding. IMPRESSION: Fluoroscopy  for posterior instrumentation at L5-S1. Electronically Signed   By: Marnee Spring M.D.   On: 02/28/2018 10:37   Dg C-arm 1-60 Min  Result Date: 02/28/2018 CLINICAL DATA:  Elective surgery EXAM: DG C-ARM 61-120 MIN; LUMBAR SPINE - 2-3 VIEW COMPARISON:  Lumbar spine CT 02/04/2018 FINDINGS: Five intraprocedural fluoroscopic images show placement of posterior rod and pedicle screw hardware at L5-S1. There is pre-existing ALIF hardware. No unexpected finding. IMPRESSION: Fluoroscopy for posterior instrumentation at L5-S1. Electronically Signed   By: Marnee Spring M.D.   On: 02/28/2018 10:37    Antibiotics:  Anti-infectives (From admission, onward)   Start     Dose/Rate Route Frequency Ordered Stop   02/28/18 1600  ceFAZolin (ANCEF) IVPB 2g/100 mL premix     2 g 200 mL/hr over 30 Minutes Intravenous Every 8 hours 02/28/18 1247 03/01/18 0759   02/28/18 1300  azithromycin (ZITHROMAX) tablet 250 mg    Note to Pharmacy:  Take 2 tablets (500 mg) on  Day 1,  followed by 1 tablet (250 mg)  once daily on Days 2 through 5.     250 mg Oral Daily 02/28/18 1247 03/04/18 0959   02/28/18 1029  vancomycin (VANCOCIN) powder  Status:  Discontinued       As needed 02/28/18 1029 02/28/18 1056   02/28/18 0854  bacitracin 50,000 Units in sodium chloride 0.9 % 500 mL irrigation  Status:  Discontinued       As needed 02/28/18 0855 02/28/18 1056   02/28/18 0600  ceFAZolin (ANCEF) IVPB 2g/100 mL premix     2 g 200 mL/hr over 30 Minutes Intravenous On call to O.R. 02/28/18 0557 02/28/18 0805      Discharge Exam: Blood pressure 107/68, pulse 63, temperature (!) 97 F (36.1 C), temperature source Oral, resp. rate 19, height 5\' 7"  (1.702 m), weight 103.3 kg, last menstrual period 02/25/2016, SpO2 97 %. Neurologic: Grossly normal Dressing dry  Discharge Medications:   Allergies as of 02/28/2018      Reactions   Adhesive [tape] Other (See Comments)   Skin irritation, patient prefers paper tape.    Celexa  [citalopram Hydrobromide] Other (See Comments)   Make patient too carefree      Medication List    STOP taking these medications   HYDROcodone-acetaminophen 10-325 MG tablet Commonly known as:  NORCO     TAKE these medications   ALPRAZolam 0.5 MG tablet Commonly known as:  XANAX TAKE 0.5 MG BY MOUTH AT BEDTIME AS NEEDED FOR SLEEP What changed:    how much to take  how to take this  when to take this  reasons to take this  additional instructions   atorvastatin 20 MG tablet Commonly known as:  LIPITOR TAKE 1 TABLET BY MOUTH ONCE DAILY   azithromycin 250 MG tablet Commonly known as:  ZITHROMAX Z-PAK Take 2 tablets (500 mg) on  Day 1,  followed by 1 tablet (250 mg) once daily on Days 2 through 5.   cyclobenzaprine 10 MG tablet Commonly known as:  FLEXERIL Take 1 tablet (10 mg total) by mouth 3 (three) times daily as needed. for muscle spams What changed:    reasons to take this  additional instructions   gabapentin 300 MG capsule Commonly known as:  NEURONTIN TAKE 1 CAPSULE BY MOUTH IN THE MORNING AND 1 CAPSULE IN THE AFTERNOON AND 2 CAPSULES AT BEDTIME What changed:    how much to take  how to take this  when to take this  additional instructions   levothyroxine 175 MCG tablet Commonly known as:  SYNTHROID, LEVOTHROID Take 1 tablet (175 mcg total) by mouth daily before breakfast.   lisinopril 10 MG tablet Commonly known as:  PRINIVIL,ZESTRIL TAKE 1 TABLET BY MOUTH ONCE DAILY   ondansetron 4 MG disintegrating tablet Commonly known as:  ZOFRAN-ODT DISSOLVE 1 TABLET IN MOUTH EVERY 8 HOURS AS NEEDED FOR NAUSEA AND VOMITING What changed:    how much to take  how to take this  when to take this  reasons to take this  additional instructions   Oxycodone HCl 10 MG Tabs Take 0.5-1 tablets (5-10 mg total) by mouth every 6 (six) hours as needed for moderate pain or severe pain ((score 4 to 6)). What changed:    medication strength  when to take  this   pantoprazole 40 MG tablet Commonly known as:  PROTONIX 1 PO 30 MINUTES PRIOR TO MEALS QD What changed:    how much to take  how to take this  when to take this  additional  instructions   PROVENTIL HFA 108 (90 Base) MCG/ACT inhaler Generic drug:  albuterol INHALE 1 TO 2 PUFFS INTO LUNGS EVERY 4 HOURS AS NEEDED FOR WHEEZING OR SHORTNESS OF BREATH What changed:  See the new instructions.            Durable Medical Equipment  (From admission, onward)         Start     Ordered   02/28/18 1248  DME Walker rolling  Once    Question:  Patient needs a walker to treat with the following condition  Answer:  S/P lumbar fusion   02/28/18 1247   02/28/18 1248  DME 3 n 1  Once     02/28/18 1247          Disposition: home   Final Dx: L5-S1 fusion  Discharge Instructions     Remove dressing in 72 hours   Complete by:  As directed    Call MD for:  difficulty breathing, headache or visual disturbances   Complete by:  As directed    Call MD for:  persistant nausea and vomiting   Complete by:  As directed    Call MD for:  redness, tenderness, or signs of infection (pain, swelling, redness, odor or green/yellow discharge around incision site)   Complete by:  As directed    Call MD for:  severe uncontrolled pain   Complete by:  As directed    Call MD for:  temperature >100.4   Complete by:  As directed    Diet - low sodium heart healthy   Complete by:  As directed    Increase activity slowly   Complete by:  As directed          Signed: Shaan Rhoads S 02/28/2018, 5:54 PM

## 2018-02-28 NOTE — Progress Notes (Signed)
Patient is discharged from room 3C04 at this time. Alert and in stable condition. IV site d/c'd and instructions read to patient and family with understanding verbalized. Ambulate out of unit with all belongings at side.

## 2018-02-28 NOTE — Anesthesia Postprocedure Evaluation (Signed)
Anesthesia Post Note  Patient: Teresa Vincent  Procedure(s) Performed: Laminectomy and Foraminotomy Lumbar Five-Sacral One, posterolateral fusion and fixation (N/A Spine Lumbar)     Patient location during evaluation: PACU Anesthesia Type: General Level of consciousness: awake and alert Pain management: pain level controlled Vital Signs Assessment: post-procedure vital signs reviewed and stable Respiratory status: spontaneous breathing, nonlabored ventilation and respiratory function stable Cardiovascular status: blood pressure returned to baseline and stable Postop Assessment: no apparent nausea or vomiting Anesthetic complications: no    Last Vitals:  Vitals:   02/28/18 1115 02/28/18 1130  BP: 120/74 107/68  Pulse: 83 68  Resp: (!) 21 10  Temp:    SpO2: 99% 93%    Last Pain:  Vitals:   02/28/18 1125  TempSrc:   PainSc: 8                  Kaylyn LayerKathryn E Dao Memmott

## 2018-02-28 NOTE — Discharge Instructions (Signed)

## 2018-03-01 ENCOUNTER — Encounter (HOSPITAL_COMMUNITY): Payer: Self-pay | Admitting: Neurological Surgery

## 2018-03-04 NOTE — Progress Notes (Deleted)
NEUROLOGY CONSULTATION NOTE  Teresa Vincent MRN: [id number] DOB: 1980-08-16  Referring provider: Merlyn AlbertWilliam S Luking, MD Primary care provider: Merlyn AlbertWilliam S Luking, MD  Reason for consult:  headache  HISTORY OF PRESENT ILLNESS: Teresa RakersCarolyn Vincent is a 37 year old ***-handed female with migraines, hypertension, post radiation hypothyroidism, asthma, anxiety who presents for headaches.  History supplemented by referring provider notes.  Onset:  *** Location:  *** Quality:  *** Intensity:  ***.  *** denies new headache, thunderclap headache or severe headache that wakes *** from sleep. Aura:  *** Prodrome:  *** Postdrome:  *** Associated symptoms:  ***.  *** denies associated unilateral numbness or weakness. Duration:  *** Frequency:  *** Frequency of abortive medication: *** Triggers:  *** Exacerbating factors:  *** Relieving factors:  *** Activity:  ***  Current NSAIDS:  *** Current analgesics: Oxycodone Current triptans:  *** Current ergotamine:  *** Current anti-emetic: Zofran ODT 4 mg Current muscle relaxants: Flexeril Current anti-anxiolytic:  *** Current sleep aide:  *** Current Antihypertensive medications: Lisinopril 10 mg daily Current Antidepressant medications:  *** Current Anticonvulsant medications: Gabapentin 600 mg 3 times daily Current anti-CGRP:  *** Current Vitamins/Herbal/Supplements:  *** Current Antihistamines/Decongestants:  *** Other therapy:  *** Other medication:  ***  Past NSAIDS: Bupropion, naproxen, diclofenac tablet, Mobic Past analgesics: Little, Excedrin, tramadol Past abortive triptans:  *** Past abortive ergotamine:  *** Past muscle relaxants: Robaxin Past anti-emetic:  *** Past antihypertensive medications: Amlodipine 10 mg, losartan Past antidepressant medications: Amitriptyline 25 mg at bedtime Past anticonvulsant medications:  *** Past anti-CGRP:  *** Past vitamins/Herbal/Supplements:  *** Past antihistamines/decongestants:   *** Other past therapies:  ***  Caffeine:  *** Alcohol:  *** Smoker:  *** Diet:  *** Exercise:  *** Depression:  ***; Anxiety:  *** Other pain:  *** Sleep hygiene:  *** Family history of headache:  ***  PAST MEDICAL HISTORY: Past Medical History:  Diagnosis Date  . Abnormal Pap smear of cervix   . ANXIETY 08/30/2007  . Asthma   . Chronic back pain   . GERD (gastroesophageal reflux disease)   . History of kidney stones   . Hypertension   . 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 INSOMNIA 08/30/2007  . MIGRAINE HEADACHE 08/30/2007  . Neck pain   . Palpitations    occasionally couple times a week  . Sciatica   . Sleep apnea    cpap  . Stroke [facility](HCC)     PAST SURGICAL HISTORY: Past Surgical History:  Procedure Laterality Date  . ABDOMINAL EXPOSURE N/A 05/29/2017   Procedure: ABDOMINAL EXPOSURE;  Surgeon: Chuck Hintickson, Christopher S, MD;  [facility];  Service: Vascular;  Laterality: N/A;  . ABDOMINAL HYSTERECTOMY    . ANTERIOR LUMBAR FUSION N/A 05/29/2017   Procedure: Anterior Lumbar Interbody Fusion  - Lumbar five sacral one;  Surgeon: Tia AlertJones, David S, MD;  [facility];  Service: Neurosurgery;  Laterality: N/A;  . CARPAL TUNNEL RELEASE Bilateral   . CHOLECYSTECTOMY    . CHOLECYSTECTOMY N/A 12/29/2016   Procedure: LAPAROSCOPIC CHOLECYSTECTOMY;  Surgeon: Lucretia RoersBridges, Lindsay C, MD;  Location: [facility] ORS;  Service: General;  Laterality: N/A;  . LAMINECTOMY WITH POSTERIOR LATERAL ARTHRODESIS LEVEL 1 N/A 02/28/2018   Procedure: Laminectomy and Foraminotomy Lumbar Five-Sacral One, posterolateral fusion and fixation;  Surgeon: Tia AlertJones, David S, MD;  [facility];  Service: Neurosurgery;  Laterality: N/A;  posterior  . LUMBAR EPIDURAL INJECTION    . STERIOD INJECTION N/A 01/10/2017   Procedure: MINOR EXPAREL INJECTION;  Surgeon:  [name], MD;  Location: [facility] ORS;  Service: General;  Laterality: N/A;  . TOTAL ABDOMINAL HYSTERECTOMY W/ BILATERAL SALPINGOOPHORECTOMY N/A   .  TUBAL LIGATION  2003    MEDICATIONS: Current Outpatient Medications on File Prior to Visit  Medication Sig Dispense Refill  . ALPRAZolam (XANAX) 0.5 MG tablet TAKE 0.5 MG BY MOUTH AT BEDTIME AS NEEDED FOR SLEEP (Patient taking differently: Take 0.5 mg by mouth at bedtime as needed for sleep. ) 30 tablet 5  . atorvastatin (LIPITOR) 20 MG tablet TAKE 1 TABLET BY MOUTH ONCE DAILY (Patient taking differently: Take 20 mg by mouth daily. ) 30 tablet 6  . azithromycin (ZITHROMAX Z-PAK) 250 MG tablet Take 2 tablets (500 mg) on  Day 1,  followed by 1 tablet (250 mg) once daily on Days 2 through 5. 6 each 0  . cyclobenzaprine (FLEXERIL) 10 MG tablet Take 1 tablet (10 mg total) by mouth 3 (three) times daily as needed. for muscle spams 90 tablet 1  . gabapentin (NEURONTIN) 300 MG capsule TAKE 1 CAPSULE BY MOUTH IN THE MORNING AND 1 CAPSULE IN THE AFTERNOON AND 2 CAPSULES AT BEDTIME (Patient taking differently: Take 600 mg by mouth 3 (three) times daily. ) 120 capsule 5  . levothyroxine (SYNTHROID, LEVOTHROID) 175 MCG tablet Take 1 tablet (175 mcg total) by mouth daily before breakfast. 30 tablet 6  . lisinopril (PRINIVIL,ZESTRIL) 10 MG tablet TAKE 1 TABLET BY MOUTH ONCE DAILY (Patient taking differently: Take 10 mg by mouth daily. ) 30 tablet 6  . ondansetron (ZOFRAN-ODT) 4 MG disintegrating tablet DISSOLVE 1 TABLET IN MOUTH EVERY 8 HOURS AS NEEDED FOR NAUSEA AND VOMITING (Patient taking differently: Take 4 mg by mouth every 8 (eight) hours as needed for nausea or vomiting. ) 30 tablet 6  . oxyCODONE 10 MG TABS Take 0.5-1 tablets (5-10 mg total) by mouth every 6 (six) hours as needed for moderate pain or severe pain ((score 4 to 6)). 40 tablet 0  . pantoprazole (PROTONIX) 40 MG tablet 1 PO 30 MINUTES PRIOR TO MEALS QD (Patient taking differently: Take 40 mg by mouth daily before breakfast. 30 MINUTES PRIOR TO MEAL) 31 tablet 11  . PROVENTIL HFA 108 (90 Base) MCG/ACT inhaler INHALE 1 TO 2 PUFFS INTO LUNGS EVERY  4 HOURS AS NEEDED FOR WHEEZING OR SHORTNESS OF BREATH (Patient taking differently: Inhale 1-2 puffs into the lungs every 4 (four) hours as needed for wheezing or shortness of breath. ) 7 each 3   Current Facility-Administered Medications on File Prior to Visit  Medication Dose Route Frequency Provider Last Rate Last Dose  . methylPREDNISolone acetate (DEPO-MEDROL) injection 40 mg  40 mg Intra-articular Once [name], MD        ALLERGIES: Allergies  Allergen Reactions  . Adhesive [Tape] Other (See Comments)    Skin irritation, patient prefers paper tape.   Budd Palmer [Citalopram Hydrobromide] Other (See Comments)    Make patient too carefree    FAMILY HISTORY: Family History  Problem Relation Age of Onset  . Hypertension Mother   . Thyroid disease Neg Hx     SOCIAL HISTORY: Social History   Socioeconomic History  . Marital status: Single    Spouse name: Not on file  . Number of children: Not on file  . Years of education: Not on file  . Highest education level: Not on file  Occupational History  . Occupation: Doesn't work outside the home  Social Needs  . Financial resource strain:  Not on file  . Food insecurity:    Worry: Not on file    Inability: Not on file  . Transportation needs:    Medical: Not on file    Non-medical: Not on file  Tobacco Use  . Smoking status: Current Every Day Smoker    Packs/day: 1.00    Years: 26.00    Pack years: 26.00    Types: Cigarettes  . Smokeless tobacco: Former Neurosurgeon  . Tobacco comment: currently 1 pack or less  Substance and Sexual Activity  . Alcohol use: Yes    Alcohol/week: 0.0 standard drinks    Comment: occasional  . Drug use: No  . Sexual activity: Not Currently    Birth control/protection: Surgical  Lifestyle  . Physical activity:    Days per week: Not on file    Minutes per session: Not on file  . Stress: Not on file  Relationships  . Social connections:    Talks on phone: Not on file    Gets together:  Not on file    Attends religious service: Not on file    Active member of club or organization: Not on file    Attends meetings of clubs or organizations: Not on file    Relationship status: Not on file  . Intimate partner violence:    Fear of current or ex partner: Not on file    Emotionally abused: Not on file    Physically abused: Not on file    Forced sexual activity: Not on file  Other Topics Concern  . Not on file  Social History Narrative  . Not on file    REVIEW OF SYSTEMS: Constitutional: No fevers, chills, or sweats, no generalized fatigue, change in appetite Eyes: No visual changes, double vision, eye pain Ear, nose and throat: No hearing loss, ear pain, nasal congestion, sore throat Cardiovascular: No chest pain, palpitations Respiratory:  No shortness of breath at rest or with exertion, wheezes GastrointestinaI: No nausea, vomiting, diarrhea, abdominal pain, fecal incontinence Genitourinary:  No dysuria, urinary retention or frequency Musculoskeletal:  No neck pain, back pain Integumentary: No rash, pruritus, skin lesions Neurological: as above Psychiatric: No depression, insomnia, anxiety Endocrine: No palpitations, fatigue, diaphoresis, mood swings, change in appetite, change in weight, increased thirst Hematologic/Lymphatic:  No purpura, petechiae. Allergic/Immunologic: no itchy/runny eyes, nasal congestion, recent allergic reactions, rashes  PHYSICAL EXAM: *** General: No acute distress.  Patient appears ***-groomed.  *** Head:  Normocephalic/atraumatic Eyes:  fundi examined but not visualized Neck: supple, no paraspinal tenderness, full range of motion Back: No paraspinal tenderness Heart: regular rate and rhythm Lungs: Clear to auscultation bilaterally. Vascular: No carotid bruits. Neurological Exam: Mental status: alert and oriented to person, place, and time, recent and remote memory intact, fund of knowledge intact, attention and concentration intact,  speech fluent and not dysarthric, language intact. Cranial nerves: CN I: not tested CN II: pupils equal, round and reactive to light, visual fields intact CN III, IV, VI:  full range of motion, no nystagmus, no ptosis CN V: facial sensation intact CN VII: upper and lower face symmetric CN VIII: hearing intact CN IX, X: gag intact, uvula midline CN XI: sternocleidomastoid and trapezius muscles intact CN XII: tongue midline Bulk & Tone: normal, no fasciculations. Motor:  5/5 throughout *** Sensation:  Pinprick *** temperature *** and vibration sensation intact.  ***. Deep Tendon Reflexes:  2+ throughout, *** toes downgoing.  *** Finger to nose testing:  Without dysmetria.  *** Heel to shin:  Without dysmetria.  *** Gait:  Normal station and stride.  Able to turn and tandem walk. Romberg ***.  IMPRESSION: ***  PLAN: ***  Thank you for allowing me to take part in the care of this patient.  Metta Clines, DO  CC: ***

## 2018-03-05 ENCOUNTER — Ambulatory Visit: Payer: Medicaid Other | Admitting: Neurology

## 2018-03-20 DIAGNOSIS — G4733 Obstructive sleep apnea (adult) (pediatric): Secondary | ICD-10-CM | POA: Diagnosis not present

## 2018-04-08 DIAGNOSIS — S32009K Unspecified fracture of unspecified lumbar vertebra, subsequent encounter for fracture with nonunion: Secondary | ICD-10-CM | POA: Diagnosis not present

## 2018-04-10 IMAGING — XA DG MYELOGRAPHY LUMBAR INJ LUMBOSACRAL
14 of 19 series · 14 of 19 positions shown · non-contrast
Comparison: MRI lumbar spine 12/16/2015.

CLINICAL DATA: Low back pain.  BILATERAL leg pain.
TECHNIQUE: Contiguous axial images were obtained through the Lumbar spine after
the intrathecal infusion of infusion. Coronal and sagittal
reconstructions were obtained of the axial image sets.

[Series 1: vasc standard · 1 of 1 slices shown (1 of 12)]
[im 1/1]
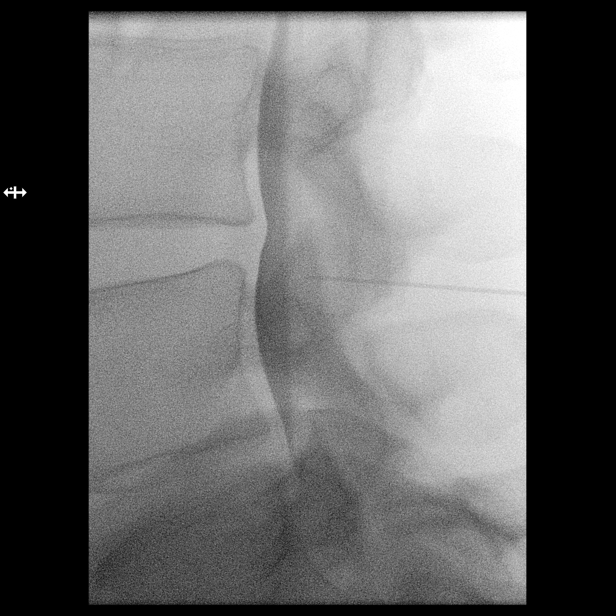

[Series 2: vasc standard · 1 of 1 slices shown (2 of 12)]
[im 1/1]
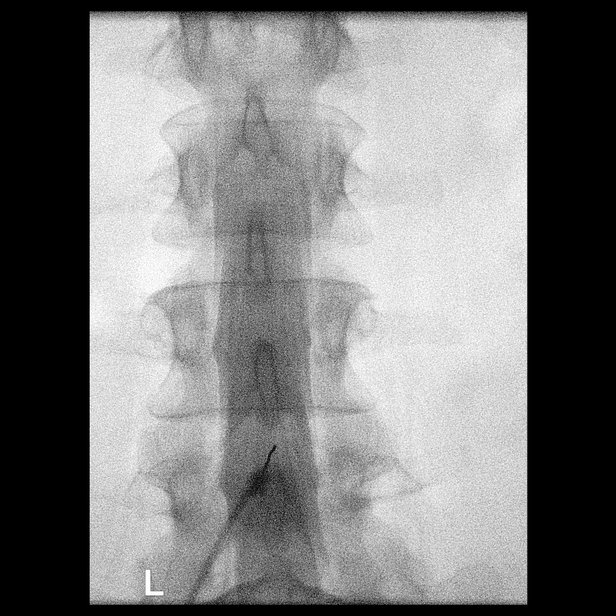

[Series 2: w lumbar spine lat · 0.15mm/px · 1 of 1 slices shown]
[im 1/1]
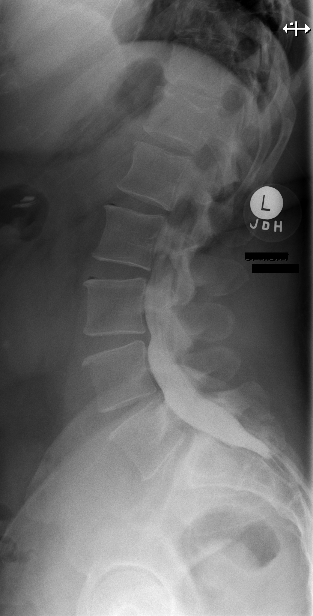

[Series 3: vasc standard · 1 of 1 slices shown (3 of 12)]
[im 1/1]
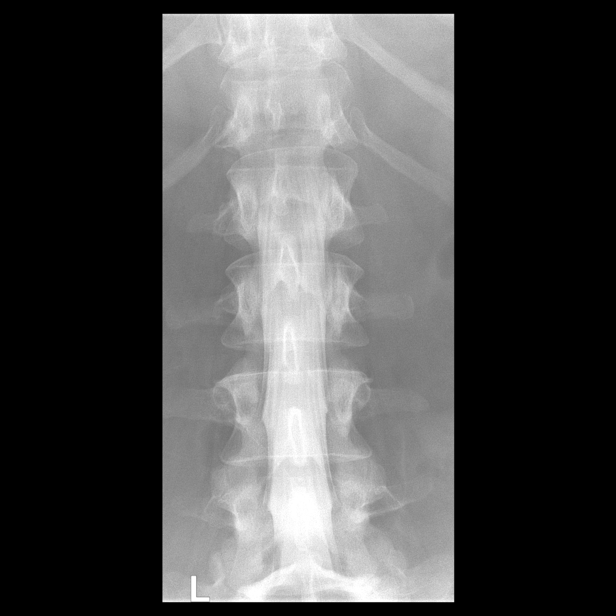

[Series 4: w lumbar spine extension · 0.15mm/px · 1 of 1 slices shown]
[im 1/1]
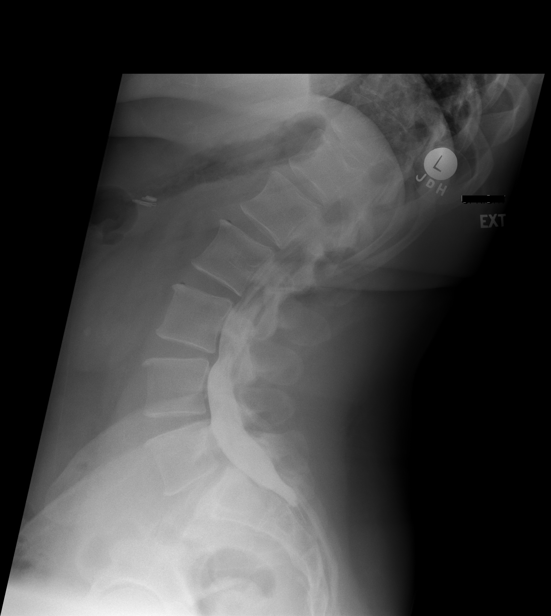

[Series 4: vasc standard · 1 of 1 slices shown (4 of 12)]
[im 1/1]
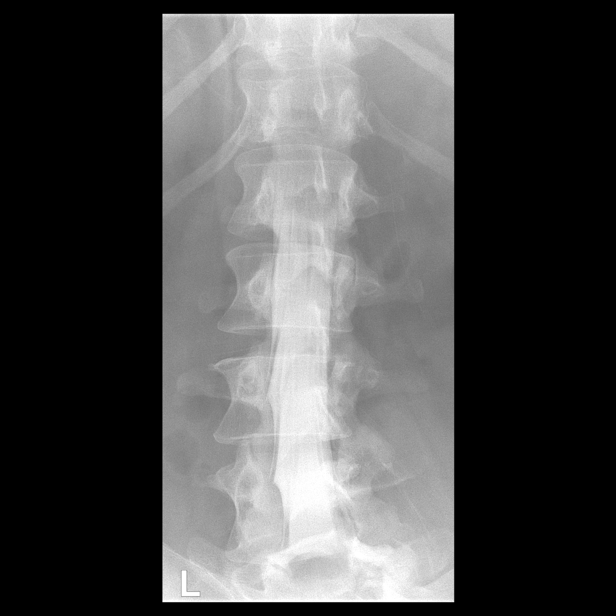

[Series 5: vasc standard · 1 of 1 slices shown (5 of 12)]
[im 1/1]
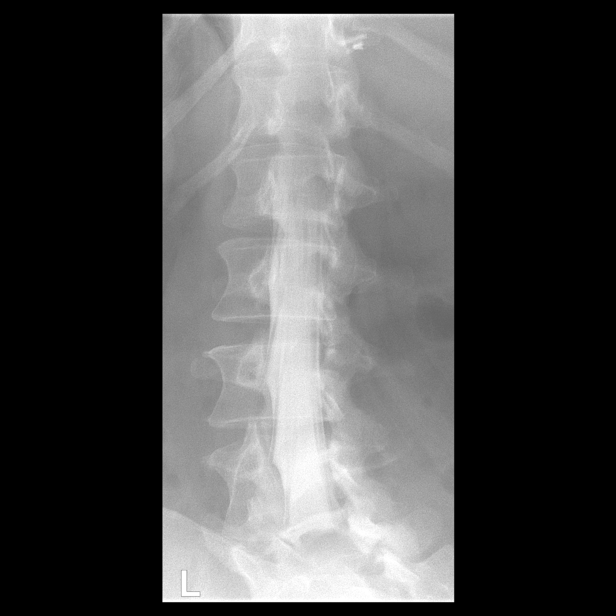

[Series 7: vasc standard · 1 of 1 slices shown (6 of 12)]
[im 1/1]
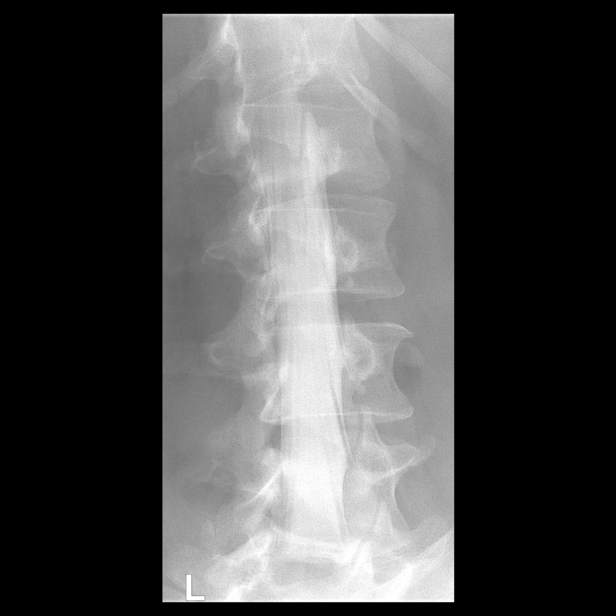

[Series 8: vasc standard · 1 of 1 slices shown (7 of 12)]
[im 1/1]
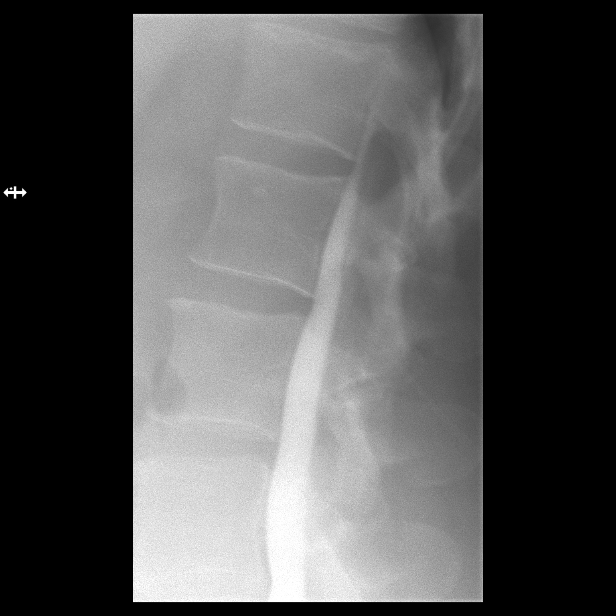

[Series 9: vasc standard · 1 of 1 slices shown (8 of 12)]
[im 1/1]
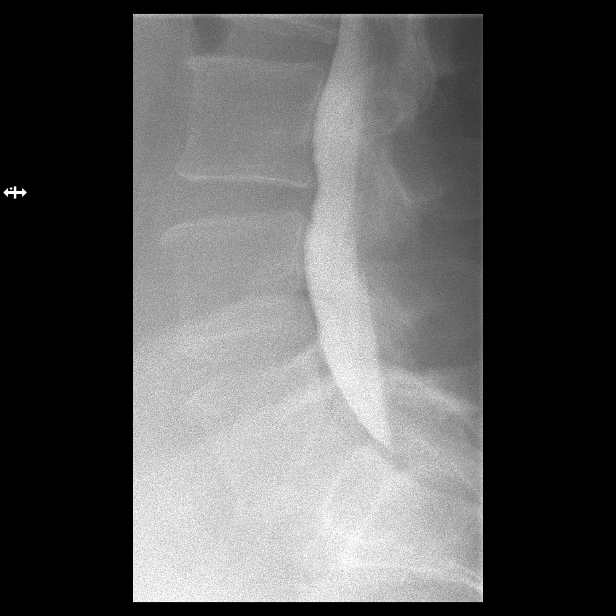

[Series 11: vasc standard · 1 of 1 slices shown (9 of 12)]
[im 1/1]
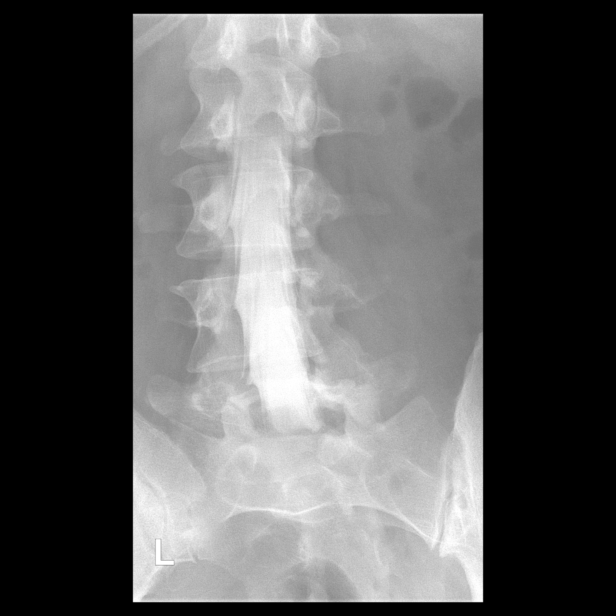

[Series 12: vasc standard · 1 of 1 slices shown (10 of 12)]
[im 1/1]
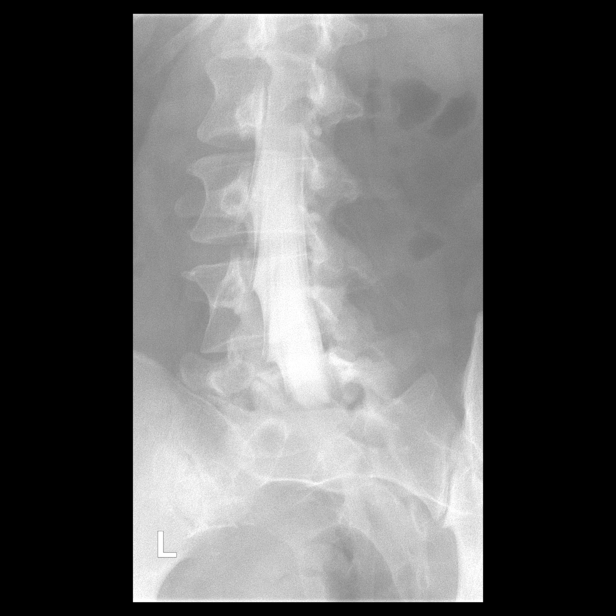

[Series 13: vasc standard · 1 of 1 slices shown (11 of 12)]
[im 1/1]
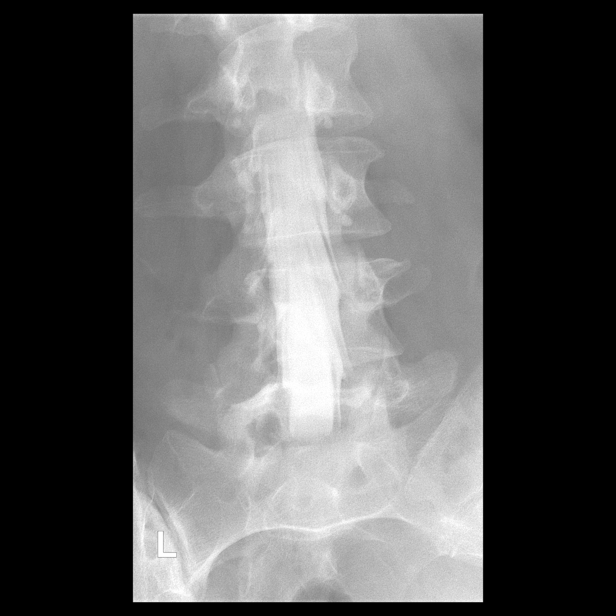

[Series 15: vasc standard · 1 of 1 slices shown (12 of 12)]
[im 1/1]
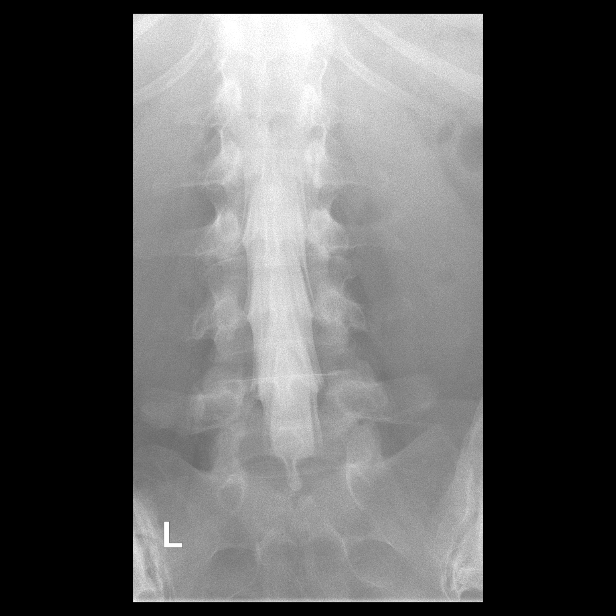

[14 of 19 positions shown; findings below may reference images not displayed]

EXAM:
LUMBAR MYELOGRAM

FLUOROSCOPY TIME:  48 seconds corresponding to a Dose Area Product
of 316.67 ?Gy*m2

PROCEDURE:
After thorough discussion of risks and benefits of the procedure
including bleeding, infection, injury to nerves, blood vessels,
adjacent structures as well as headache and CSF leak, written and
oral informed consent was obtained. Consent was obtained by Dr. Arvin
Romello. Time out form was completed.

Patient was positioned prone on the fluoroscopy table. Local
anesthesia was provided with 1% lidocaine without epinephrine after
prepped and draped in the usual sterile fashion. Puncture was
performed at L3-L4 using a 3 1/2 inch 22-gauge spinal needle via a
midline approach. Using a single pass through the dura, the needle
was placed within the thecal sac, with return of clear CSF. 15 mL of
Isovue [id number] was injected into the thecal sac, with normal
opacification of the nerve roots and cauda equina consistent with
free flow within the subarachnoid space.

I personally performed the lumbar puncture and administered the
intrathecal contrast. I also personally supervised acquisition of
the myelogram images.
FINDINGS: LUMBAR MYELOGRAM FINDINGS:

Good opacification lumbar subarachnoid space. There is no spinal
stenosis or nerve root cut off.

Anatomic alignment is present except for 4 mm anterolisthesis L5 on
S1. This is related to BILATERAL L5 spondylolysis.

With patient standing, the anterolisthesis at L5-S1 increases to 6
mm in neutral, flexion, and extension. On the standing [facility] view,
there is no stenosis at L5-S1.

CT LUMBAR MYELOGRAM FINDINGS:

Segmentation: Normal.

Alignment: 3 mm anterolisthesis L5 on S1 with patient supine for CT.

Vertebrae: No worrisome osseous lesion.BILATERAL L5 pars defects are
observed.

Conus medullaris: Normal in size. Abnormally low termination at L2.
No thickened filum terminale is seen.

Paraspinal tissues: No evidence for hydronephrosis or paravertebral
mass. RIGHT nephrolithiasis. Aortic atherosclerosis.

Disc levels:

L1-L2:  Normal.

L2-L3:  Normal.

L3-L4:  Annular bulge.  No impingement.

L4-L5:  Normal disc space.  Mild facet arthropathy.  No impingement.

L5-S1: BILATERAL L5 spondylolysis. Grade 1 spondylolisthesis.
Uncovering of the disc with annular bulge. No subarticular zone or
foraminal zone narrowing.

Compared with prior MR, similar appearance.
IMPRESSION: LUMBAR MYELOGRAM IMPRESSION:

No visible spinal stenosis or nerve root cut off.

Dynamic instability at L5-S1, related to BILATERAL L5 spondylolysis
; 4 mm anterolisthesis with patient prone increases to 6 mm
standing.

CT LUMBAR MYELOGRAM IMPRESSION:

BILATERAL L5 spondylolysis with mild grade 1 L5-S1
spondylolisthesis. Discal uncovering without subarticular zone or
foraminal zone narrowing.

No spinal stenosis or nerve root impingement can be seen at the
other levels.

## 2018-04-20 DIAGNOSIS — G4733 Obstructive sleep apnea (adult) (pediatric): Secondary | ICD-10-CM | POA: Diagnosis not present

## 2018-04-30 ENCOUNTER — Other Ambulatory Visit: Payer: Self-pay | Admitting: Family Medicine

## 2018-05-01 NOTE — Telephone Encounter (Signed)
Six mo worth 

## 2018-05-19 DIAGNOSIS — G4733 Obstructive sleep apnea (adult) (pediatric): Secondary | ICD-10-CM | POA: Diagnosis not present

## 2018-05-28 LAB — COMPLETE METABOLIC PANEL WITH GFR
AG Ratio: 1.8 (calc) (ref 1.0–2.5)
ALKALINE PHOSPHATASE (APISO): 76 U/L (ref 31–125)
ALT: 10 U/L (ref 6–29)
AST: 13 U/L (ref 10–30)
Albumin: 4.2 g/dL (ref 3.6–5.1)
BILIRUBIN TOTAL: 0.3 mg/dL (ref 0.2–1.2)
BUN: 9 mg/dL (ref 7–25)
CHLORIDE: 106 mmol/L (ref 98–110)
CO2: 27 mmol/L (ref 20–32)
Calcium: 9.2 mg/dL (ref 8.6–10.2)
Creat: 0.79 mg/dL (ref 0.50–1.10)
GFR, Est African American: 111 mL/min/{1.73_m2} (ref >=60)
GFR, Est Non African American: 96 mL/min/{1.73_m2} (ref >=60)
GLUCOSE: 102 mg/dL — AB (ref 65–99)
Globulin: 2.4 g/dL (calc) (ref 1.9–3.7)
Potassium: 4.9 mmol/L (ref 3.5–5.3)
Sodium: 141 mmol/L (ref 135–146)
TOTAL PROTEIN: 6.6 g/dL (ref 6.1–8.1)

## 2018-05-28 LAB — HEMOGLOBIN A1C
Hgb A1c MFr Bld: 5.8 % of total Hgb — ABNORMAL HIGH (ref <5.7)
Mean Plasma Glucose: 120 (calc)
eAG (mmol/L): 6.6 (calc)

## 2018-05-28 LAB — T4, FREE: Free T4: 1.5 ng/dL (ref 0.8–1.8)

## 2018-05-28 LAB — TSH: TSH: 0.25 m[IU]/L — AB

## 2018-06-04 ENCOUNTER — Ambulatory Visit: Payer: Medicaid Other | Admitting: "Endocrinology

## 2018-06-07 ENCOUNTER — Emergency Department (HOSPITAL_COMMUNITY)
Admission: EM | Admit: 2018-06-07 | Discharge: 2018-06-07 | Disposition: A | Discharge status: Home / Self Care | Payer: 59 | Attending: Emergency Medicine | Admitting: Emergency Medicine

## 2018-06-07 ENCOUNTER — Emergency Department (HOSPITAL_COMMUNITY): Payer: 59

## 2018-06-07 ENCOUNTER — Other Ambulatory Visit: Payer: Self-pay

## 2018-06-07 ENCOUNTER — Ambulatory Visit (INDEPENDENT_AMBULATORY_CARE_PROVIDER_SITE_OTHER): Payer: 59 | Admitting: Family Medicine

## 2018-06-07 ENCOUNTER — Encounter (HOSPITAL_COMMUNITY): Payer: Self-pay

## 2018-06-07 VITALS — Ht 67.0 in

## 2018-06-07 DIAGNOSIS — B349 Viral infection, unspecified: Secondary | ICD-10-CM

## 2018-06-07 DIAGNOSIS — J4521 Mild intermittent asthma with (acute) exacerbation: Secondary | ICD-10-CM | POA: Diagnosis not present

## 2018-06-07 DIAGNOSIS — R0981 Nasal congestion: Secondary | ICD-10-CM | POA: Insufficient documentation

## 2018-06-07 DIAGNOSIS — J40 Bronchitis, not specified as acute or chronic: Secondary | ICD-10-CM | POA: Diagnosis not present

## 2018-06-07 DIAGNOSIS — I10 Essential (primary) hypertension: Secondary | ICD-10-CM | POA: Diagnosis not present

## 2018-06-07 DIAGNOSIS — E890 Postprocedural hypothyroidism: Secondary | ICD-10-CM | POA: Diagnosis not present

## 2018-06-07 DIAGNOSIS — J45909 Unspecified asthma, uncomplicated: Secondary | ICD-10-CM | POA: Diagnosis not present

## 2018-06-07 DIAGNOSIS — Z79899 Other long term (current) drug therapy: Secondary | ICD-10-CM | POA: Insufficient documentation

## 2018-06-07 DIAGNOSIS — F17210 Nicotine dependence, cigarettes, uncomplicated: Secondary | ICD-10-CM | POA: Insufficient documentation

## 2018-06-07 DIAGNOSIS — J209 Acute bronchitis, unspecified: Secondary | ICD-10-CM

## 2018-06-07 DIAGNOSIS — R7303 Prediabetes: Secondary | ICD-10-CM | POA: Diagnosis not present

## 2018-06-07 DIAGNOSIS — R0602 Shortness of breath: Secondary | ICD-10-CM | POA: Diagnosis not present

## 2018-06-07 DIAGNOSIS — R05 Cough: Secondary | ICD-10-CM | POA: Diagnosis not present

## 2018-06-07 MED ORDER — LORATADINE 10 MG PO TABS: 10.0000 mg | ORAL_TABLET | Freq: Every day | ORAL | 0 refills | Status: DC

## 2018-06-07 MED ORDER — ALBUTEROL (5 MG/ML) CONTINUOUS INHALATION SOLN
10.0000 mg/h | INHALATION_SOLUTION | RESPIRATORY_TRACT | Status: DC
  Administered 2018-06-07: 10 mg/h via RESPIRATORY_TRACT
  Filled 2018-06-07: qty 20

## 2018-06-07 MED ORDER — METHYLPREDNISOLONE SODIUM SUCC 125 MG IJ SOLR
125.0000 mg | Freq: Once | INTRAMUSCULAR | Status: AC
  Administered 2018-06-07: 125 mg via INTRAMUSCULAR
  Filled 2018-06-07: qty 2

## 2018-06-07 MED ORDER — FLUTICASONE PROPIONATE 50 MCG/ACT NA SUSP: 1.0000 | Freq: Every day | NASAL | 0 refills | Status: DC

## 2018-06-07 MED ORDER — ALBUTEROL SULFATE (2.5 MG/3ML) 0.083% IN NEBU
5.0000 mg | INHALATION_SOLUTION | Freq: Once | RESPIRATORY_TRACT | Status: AC
  Administered 2018-06-07: 5 mg via RESPIRATORY_TRACT
  Filled 2018-06-07: qty 6

## 2018-06-07 MED ORDER — PREDNISONE 20 MG PO TABS: ORAL_TABLET | ORAL | 0 refills | Status: DC

## 2018-06-07 MED ORDER — ACETAMINOPHEN 500 MG PO TABS
1000.0000 mg | ORAL_TABLET | Freq: Once | ORAL | Status: AC
  Administered 2018-06-07: 1000 mg via ORAL
  Filled 2018-06-07: qty 2

## 2018-06-07 NOTE — Discharge Instructions (Addendum)
Self-quarantine at home while you are still symptomatic.  Make sure you are watching your hands frequently.  If your symptoms persist follow-up with your primary care physician.  If you develop worsening shortness of breath, high fever or any concerns return to the emergency department.

## 2018-06-07 NOTE — ED Notes (Addendum)
Pt standing in hall  When asked to return to room, pt verbally erupts upon the nurse   States is hot in room, that she coughs in room All attempts to educated that cough is why she should remain in room are rebuffed and she demands to speak w docter

## 2018-06-07 NOTE — ED Notes (Signed)
RT starting an hour long neb

## 2018-06-07 NOTE — ED Provider Notes (Signed)
[facility] EMERGENCY DEPARTMENT Provider Note   CSN: [id number] Arrival date & time: 06/07/18  1616    History   Chief Complaint Chief Complaint  Patient presents with   Cough    HPI Teresa Vincent is a 38 y.o. female.     HPI Patient presents with 4 days of nonproductive cough, wheezing, nasal congestion, sinus pressure, headache, chest pain worse with movement and palpation.  Denies any fever.  No recent travel or known exposures.  No focal weakness or numbness.  No new rashes. Past Medical History:  Diagnosis Date   Abnormal Pap smear of cervix    ANXIETY 08/30/2007   Asthma    Chronic back pain    GERD (gastroesophageal reflux disease)    History of kidney stones    Hypertension    HYPOTHYROIDISM, POST-RADIATION 02/08/2009   INSOMNIA 08/30/2007   MIGRAINE HEADACHE 08/30/2007   Neck pain    Palpitations    occasionally couple times a week   Sciatica    Sleep apnea    cpap   Stroke [facility])     Patient Active Problem List   Diagnosis Date Noted   S/P lumbar fusion 02/28/2018   Prediabetes 12/04/2017   Current smoker 12/04/2017   S/P lumbar spinal fusion 05/29/2017   Rectal bleeding 02/01/2017   Post-operative pain 12/30/2016   Calculus of gallbladder without cholecystitis without obstruction    Mixed hyperlipidemia 10/11/2016   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/10/2015   Class 2 severe obesity due to excess calories with serious comorbidity and body mass index (BMI) of 36.0 to 36.9 in adult (HCC) 06/10/2015   Left-sided low back pain with left-sided sciatica 05/03/2015   Essential hypertension, benign 12/30/2012   Esophageal reflux 12/30/2012   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   ANXIETY 08/30/2007   MIGRAINE HEADACHE 08/30/2007   Insomnia 08/30/2007    Past Surgical History:  Procedure Laterality Date   ABDOMINAL EXPOSURE N/A 05/29/2017   Procedure: ABDOMINAL  EXPOSURE;  Surgeon: Chuck Hint, MD;  Location: [facility] OR;  Service: Vascular;  Laterality: N/A;   ABDOMINAL HYSTERECTOMY     ANTERIOR LUMBAR FUSION N/A 05/29/2017   Procedure: Anterior Lumbar Interbody Fusion  - Lumbar five sacral one;  Surgeon: Tia Alert, MD;  Location: [facility] OR;  Service: Neurosurgery;  Laterality: N/A;   CARPAL TUNNEL RELEASE Bilateral    CHOLECYSTECTOMY     CHOLECYSTECTOMY N/A 12/29/2016   Procedure: LAPAROSCOPIC CHOLECYSTECTOMY;  Surgeon: [name], MD;  Location: [facility] ORS;  Service: General;  Laterality: N/A;   LAMINECTOMY WITH POSTERIOR LATERAL ARTHRODESIS LEVEL 1 N/A 02/28/2018   Procedure: Laminectomy and Foraminotomy Lumbar Five-Sacral One, posterolateral fusion and fixation;  Surgeon: Tia Alert, MD;  Location: [facility] OR;  Service: Neurosurgery;  Laterality: N/A;  posterior   LUMBAR EPIDURAL INJECTION     STERIOD INJECTION N/A 01/10/2017   Procedure: MINOR EXPAREL INJECTION;  Surgeon: [name], MD;  Location: [facility] ORS;  Service: General;  Laterality: N/A;   TOTAL ABDOMINAL HYSTERECTOMY W/ BILATERAL SALPINGOOPHORECTOMY N/A    TUBAL LIGATION  2003     OB History    Gravida  4   Para  1   Term  1   Preterm      AB  3   Living  1     SAB      TAB      Ectopic      Multiple  Live Births               Home Medications    Prior to Admission medications   Medication Sig Start Date End Date Taking? Authorizing Provider  ALPRAZolam Prudy Feeler) 0.5 MG tablet TAKE 1 TABLET BY MOUTH AT BEDTIME AS NEEDED FOR SLEEP 05/01/18  Yes [name], MD  atorvastatin (LIPITOR) 20 MG tablet TAKE 1 TABLET BY MOUTH ONCE DAILY Patient taking differently: Take 20 mg by mouth daily.  02/11/18  Yes Nida, Denman George, MD  cyclobenzaprine (FLEXERIL) 10 MG tablet Take 1 tablet (10 mg total) by mouth 3 (three) times daily as needed. for muscle spams 02/28/18  Yes Tia Alert, MD  gabapentin (NEURONTIN) 300 MG capsule TAKE 1  CAPSULE BY MOUTH IN THE MORNING AND 1 CAPSULE IN THE AFTERNOON AND 2 CAPSULES AT BEDTIME 09/03/17  Yes Campbell Riches, NP  HYDROcodone-acetaminophen (NORCO) 10-325 MG tablet Take 1 tablet by mouth every 6 (six) hours as needed. One per day   Yes Tia Alert, MD  levothyroxine (SYNTHROID, LEVOTHROID) 175 MCG tablet Take 1 tablet (175 mcg total) by mouth daily before breakfast. 12/04/17  Yes Nida, Denman George, MD  lisinopril (PRINIVIL,ZESTRIL) 10 MG tablet TAKE 1 TABLET BY MOUTH ONCE DAILY Patient taking differently: Take 10 mg by mouth daily.  01/21/18  Yes Nida, Denman George, MD  losartan (COZAAR) 50 MG tablet Take 50 mg by mouth daily.    Yes [provider]  ondansetron (ZOFRAN-ODT) 4 MG disintegrating tablet DISSOLVE 1 TABLET IN MOUTH EVERY 8 HOURS AS NEEDED FOR NAUSEA AND VOMITING Patient taking differently: Take 4 mg by mouth every 8 (eight) hours as needed for nausea or vomiting.  11/01/17  Yes Luking, Scott A, MD  pantoprazole (PROTONIX) 40 MG tablet 1 PO 30 MINUTES PRIOR TO MEALS QD Patient taking differently: Take 40 mg by mouth daily before breakfast. 30 MINUTES PRIOR TO MEAL 03/29/17  Yes Fields, Sandi L, MD  fluticasone (FLONASE) 50 MCG/ACT nasal spray Place 1 spray into both nostrils daily. 06/07/18   Loren Racer, MD  loratadine (CLARITIN) 10 MG tablet Take 1 tablet (10 mg total) by mouth daily. 06/07/18   Loren Racer, MD  predniSONE (DELTASONE) 20 MG tablet 3 tabs po day one, then 2 po daily x 4 days 06/07/18   Loren Racer, MD    Family History Family History  Problem Relation Age of Onset   Hypertension Mother    Thyroid disease Neg Hx     Social History Social History   Tobacco Use   Smoking status: Current Every Day Smoker    Packs/day: 1.00    Years: 26.00    Pack years: 26.00    Types: Cigarettes   Smokeless tobacco: Former Neurosurgeon   Tobacco comment: currently 1 pack or less  Substance Use Topics   Alcohol use: Yes     Alcohol/week: 0.0 standard drinks    Comment: occasional   Drug use: Yes    Types: Marijuana     Allergies   Adhesive [tape] and Celexa [citalopram hydrobromide]   Review of Systems Review of Systems  Constitutional: Negative for chills and fever.  HENT: Positive for congestion and sinus pressure. Negative for sore throat and trouble swallowing.   Eyes: Negative for visual disturbance.  Respiratory: Positive for cough, shortness of breath and wheezing.   Cardiovascular: Positive for chest pain. Negative for palpitations and leg swelling.  Gastrointestinal: Negative for abdominal pain, diarrhea, nausea and vomiting.  Genitourinary:  Negative for dysuria, flank pain and frequency.  Musculoskeletal: Positive for myalgias and neck pain. Negative for back pain and neck stiffness.  Skin: Negative for rash and wound.  Neurological: Positive for headaches. Negative for dizziness, weakness, light-headedness and numbness.  All other systems reviewed and are negative.    Physical Exam Updated Vital Signs BP 138/78 (BP Location: Right Arm)    Pulse (!) 109    Temp 97.6 F (36.4 C) (Oral)    Resp 18    Ht  (1.702 m)    Wt 104.3 kg    LMP 02/25/2016    SpO2 95%    BMI 36.02 kg/m   Physical Exam Vitals signs and nursing note reviewed.  Constitutional:      General: She is not in acute distress.    Appearance: Normal appearance. She is well-developed. She is not ill-appearing.  HENT:     Head: Normocephalic and atraumatic.     Comments: Patient with bilateral frontal sinus tenderness to percussion.  Bilateral nasal mucosal edema.  Oropharynx is mildly erythematous without exudates.  Uvula is midline.    Nose: Congestion present.     Mouth/Throat:     Mouth: Mucous membranes are moist.     Pharynx: Posterior oropharyngeal erythema present. No oropharyngeal exudate.  Eyes:     Extraocular Movements: Extraocular movements intact.     Conjunctiva/sclera: Conjunctivae normal.      Pupils: Pupils are equal, round, and reactive to light.  Neck:     Musculoskeletal: Normal range of motion and neck supple. Muscular tenderness present. No neck rigidity.     Comments: Patient with left trapezius tenderness to palpation.  No meningismus.  No anterior cervical lymphadenopathy. Cardiovascular:     Rate and Rhythm: Normal rate and regular rhythm.     Heart sounds: No murmur. No friction rub. No gallop.   Pulmonary:     Effort: Pulmonary effort is normal.     Breath sounds: Wheezing present.     Comments: Expiratory wheezing throughout.  Anterior chest wall tenderness to palpation.  No crepitance or deformity. Abdominal:     General: Bowel sounds are normal.     Palpations: Abdomen is soft.     Tenderness: There is no abdominal tenderness. There is no guarding or rebound.  Musculoskeletal: Normal range of motion.        General: No swelling, tenderness, deformity or signs of injury.     Right lower leg: No edema.     Left lower leg: No edema.  Lymphadenopathy:     Cervical: No cervical adenopathy.  Skin:    General: Skin is warm and dry.     Capillary Refill: Capillary refill takes less than 2 seconds.     Findings: No erythema or rash.  Neurological:     General: No focal deficit present.     Mental Status: She is alert and oriented to person, place, and time.     Comments: Moving all extremities without focal deficit.  Sensation fully intact.  Psychiatric:        Mood and Affect: Mood normal.        Behavior: Behavior normal.      ED Treatments / Results  Labs (all labs ordered are listed, but only abnormal results are displayed) Labs Reviewed - No data to display  EKG None  Radiology Dg Chest 2 View  Result Date: 06/07/2018 CLINICAL DATA:  Cough and body aches.  Shortness of breath. EXAM: CHEST - 2 VIEW  COMPARISON:  02/20/2018 FINDINGS: The lungs are clear without focal pneumonia, edema, pneumothorax or pleural effusion. Interstitial markings are  diffusely coarsened with chronic features. Opacity at the cardiac apex is compatible with fat pad as seen on CT of 01/02/2017. The cardiopericardial silhouette is within normal limits for size. The visualized bony structures of the thorax are intact. IMPRESSION: No active cardiopulmonary disease. Electronically Signed   By: Kennith Center M.D.   On: 06/07/2018 17:33    Procedures Procedures (including critical care time)  Medications Ordered in ED Medications  methylPREDNISolone sodium succinate (SOLU-MEDROL) 125 mg/2 mL injection 125 mg (125 mg Intramuscular Given 06/07/18 1718)  acetaminophen (TYLENOL) tablet 1,000 mg (1,000 mg Oral Given 06/07/18 1716)  albuterol (PROVENTIL) (2.5 MG/3ML) 0.083% nebulizer solution 5 mg (5 mg Nebulization Given 06/07/18 1721)     Initial Impression / Assessment and Plan / ED Course  I have reviewed the triage vital signs and the nursing notes.  Pertinent labs & imaging results that were available during my care of the patient were reviewed by me and considered in my medical decision making (see chart for details).       Patient is feeling much better after nebulized treatment x2.  Still has a few scattered wheezes though this is significantly improved.  Will give short course of prednisone for likely bronchitis with bronchospasm.  Patient remains afebrile.  Advised self isolation until symptoms have resolved.   Final Clinical Impressions(s) / ED Diagnoses   Final diagnoses:  Nasal sinus congestion  Bronchitis with bronchospasm    ED Discharge Orders         Ordered    predniSONE (DELTASONE) 20 MG tablet     06/07/18 2038    loratadine (CLARITIN) 10 MG tablet  Daily     06/07/18 2038    fluticasone (FLONASE) 50 MCG/ACT nasal spray  Daily     06/07/18 2038           Loren Racer, MD 06/08/18 1535

## 2018-06-07 NOTE — Progress Notes (Addendum)
   Subjective:    Patient ID: Teresa Vincent, female    DOB: [birth year], 38 y.o.   MRN: [id number]  HPI Patient is here today due to a cough.She developed a cough on Tuesday.  She does not have a fever, but does have some shortness of breath. She states when using her inhaler she is still not getting a good breath.  She also states she has had some chest pressure and stabbing pain in shoulder in left and right. She thinks she has pulled a muscle coughing.  She states she took her husband to [facility] on Monday for cellulitis and Afib he was admitted to the hospital and was discharged on Wednesday.  She has been around other people at work who have been sick . She works at [employer] out they are not closing.   She has been taking musinex ,vicks nasal. Review of Systems  Constitutional: Negative for activity change and fever.  HENT: Positive for congestion and rhinorrhea. Negative for ear pain.   Eyes: Negative for discharge.  Respiratory: Positive for cough. Negative for shortness of breath and wheezing.   Cardiovascular: Negative for chest pain.       Objective:   Physical Exam Vitals signs and nursing note reviewed.  Constitutional:      Appearance: She is well-developed.  HENT:     Head: Normocephalic.     Nose: Nose normal.     Mouth/Throat:     Pharynx: No oropharyngeal exudate.  Neck:     Musculoskeletal: Neck supple.  Cardiovascular:     Rate and Rhythm: Normal rate.     Heart sounds: Normal heart sounds. No murmur.  Pulmonary:     Effort: Pulmonary effort is normal.     Breath sounds: Wheezing present.  Lymphadenopathy:     Cervical: No cervical adenopathy.  Skin:    General: Skin is warm and dry.           Assessment & Plan:  Reactive airway Probable viral syndrome Cannot rule out other entities I feel the patient does need to be seen in the emergency department for further evaluation treatment  She was encouraged to follow-up if ongoing  troubles or worse  15 minutes was spent with patient today discussing healthcare issues which they came.  More than 50% of this visit-total duration of visit-was spent in counseling and coordination of care.  Please see diagnosis regarding the focus of this coordination and care

## 2018-06-07 NOTE — ED Notes (Signed)
Dr Ranae Palms in speak with pt

## 2018-06-07 NOTE — ED Triage Notes (Signed)
Pt c/o cough, headache, body aches, SOB that started last night. Dr sent patient for possible flu/pneumonia.

## 2018-06-10 ENCOUNTER — Telehealth: Payer: Self-pay | Admitting: Family Medicine

## 2018-06-10 ENCOUNTER — Emergency Department (HOSPITAL_COMMUNITY)
Admission: EM | Admit: 2018-06-10 | Discharge: 2018-06-10 | Disposition: A | Discharge status: Home / Self Care | Payer: 59 | Attending: Emergency Medicine | Admitting: Emergency Medicine

## 2018-06-10 ENCOUNTER — Encounter (HOSPITAL_COMMUNITY): Payer: Self-pay | Admitting: Emergency Medicine

## 2018-06-10 ENCOUNTER — Other Ambulatory Visit: Payer: Self-pay

## 2018-06-10 ENCOUNTER — Emergency Department (HOSPITAL_COMMUNITY): Payer: 59

## 2018-06-10 DIAGNOSIS — F17210 Nicotine dependence, cigarettes, uncomplicated: Secondary | ICD-10-CM | POA: Diagnosis not present

## 2018-06-10 DIAGNOSIS — Z79899 Other long term (current) drug therapy: Secondary | ICD-10-CM | POA: Insufficient documentation

## 2018-06-10 DIAGNOSIS — J45909 Unspecified asthma, uncomplicated: Secondary | ICD-10-CM | POA: Diagnosis not present

## 2018-06-10 DIAGNOSIS — Z8673 Personal history of transient ischemic attack (TIA), and cerebral infarction without residual deficits: Secondary | ICD-10-CM | POA: Insufficient documentation

## 2018-06-10 DIAGNOSIS — R05 Cough: Secondary | ICD-10-CM | POA: Diagnosis not present

## 2018-06-10 DIAGNOSIS — I10 Essential (primary) hypertension: Secondary | ICD-10-CM | POA: Insufficient documentation

## 2018-06-10 DIAGNOSIS — E039 Hypothyroidism, unspecified: Secondary | ICD-10-CM | POA: Insufficient documentation

## 2018-06-10 DIAGNOSIS — R059 Cough, unspecified: Secondary | ICD-10-CM

## 2018-06-10 DIAGNOSIS — R0602 Shortness of breath: Secondary | ICD-10-CM | POA: Diagnosis not present

## 2018-06-10 LAB — COMPREHENSIVE METABOLIC PANEL
ALT: 12 U/L (ref 0–44)
AST: 15 U/L (ref 15–41)
Albumin: 3.6 g/dL (ref 3.5–5.0)
Alkaline Phosphatase: 66 U/L (ref 38–126)
Anion gap: 8 (ref 5–15)
BUN: 19 mg/dL (ref 6–20)
CO2: 26 mmol/L (ref 22–32)
Calcium: 8.4 mg/dL — ABNORMAL LOW (ref 8.9–10.3)
Chloride: 105 mmol/L (ref 98–111)
Creatinine, Ser: 0.85 mg/dL (ref 0.44–1.00)
GFR calc Af Amer: >60 mL/min (ref >60)
GFR calc non Af Amer: >60 mL/min (ref >60)
GLUCOSE: 98 mg/dL (ref 70–99)
Potassium: 3.6 mmol/L (ref 3.5–5.1)
Sodium: 139 mmol/L (ref 135–145)
Total Bilirubin: 0.2 mg/dL — ABNORMAL LOW (ref 0.3–1.2)
Total Protein: 6.6 g/dL (ref 6.5–8.1)

## 2018-06-10 LAB — CBC WITH DIFFERENTIAL/PLATELET
Abs Immature Granulocytes: 0.07 10*3/uL (ref 0.00–0.07)
Basophils Absolute: 0 10*3/uL (ref 0.0–0.1)
Basophils Relative: 0 %
Eosinophils Absolute: 0.1 10*3/uL (ref 0.0–0.5)
Eosinophils Relative: 1 %
HCT: 43 % (ref 36.0–46.0)
Hemoglobin: 13.9 g/dL (ref 12.0–15.0)
Immature Granulocytes: 1 %
Lymphocytes Relative: 34 %
Lymphs Abs: 4.1 10*3/uL — ABNORMAL HIGH (ref 0.7–4.0)
MCH: 30 pg (ref 26.0–34.0)
MCHC: 32.3 g/dL (ref 30.0–36.0)
MCV: 92.9 fL (ref 80.0–100.0)
Monocytes Absolute: 0.7 10*3/uL (ref 0.1–1.0)
Monocytes Relative: 6 %
Neutro Abs: 7.1 10*3/uL (ref 1.7–7.7)
Neutrophils Relative %: 58 %
Platelets: 260 10*3/uL (ref 150–400)
RBC: 4.63 MIL/uL (ref 3.87–5.11)
RDW: 13.4 % (ref 11.5–15.5)
WBC: 12.1 10*3/uL — ABNORMAL HIGH (ref 4.0–10.5)
nRBC: 0 % (ref 0.0–0.2)

## 2018-06-10 MED ORDER — IPRATROPIUM-ALBUTEROL 0.5-2.5 (3) MG/3ML IN SOLN
3.0000 mL | Freq: Once | RESPIRATORY_TRACT | Status: AC
  Administered 2018-06-10: 3 mL via RESPIRATORY_TRACT
  Filled 2018-06-10: qty 3

## 2018-06-10 MED ORDER — METHYLPREDNISOLONE SODIUM SUCC 125 MG IJ SOLR
125.0000 mg | Freq: Once | INTRAMUSCULAR | Status: AC
  Administered 2018-06-10: 125 mg via INTRAVENOUS
  Filled 2018-06-10: qty 2

## 2018-06-10 MED ORDER — ALBUTEROL SULFATE (2.5 MG/3ML) 0.083% IN NEBU
2.5000 mg | INHALATION_SOLUTION | Freq: Once | RESPIRATORY_TRACT | Status: AC
  Administered 2018-06-10: 2.5 mg via RESPIRATORY_TRACT
  Filled 2018-06-10: qty 3

## 2018-06-10 MED ORDER — MAGNESIUM SULFATE 2 GM/50ML IV SOLN
2.0000 g | Freq: Once | INTRAVENOUS | Status: AC
  Administered 2018-06-10: 2 g via INTRAVENOUS
  Filled 2018-06-10: qty 50

## 2018-06-10 MED ORDER — LEVOFLOXACIN 500 MG PO TABS: 500.0000 mg | ORAL_TABLET | Freq: Every day | ORAL | 0 refills | Status: DC

## 2018-06-10 NOTE — ED Provider Notes (Signed)
[facility] EMERGENCY DEPARTMENT Provider Note   CSN: [id number] Arrival date & time: 06/10/18  0909    History   Chief Complaint Chief Complaint  Patient presents with  . Cough    HPI Teresa Vincent is a 38 y.o. female.     Patient complains of persistent cough and shortness of breath.  No fever  The history is provided by the patient. No language interpreter was used.  Cough  Cough characteristics:  Dry Sputum characteristics:  Nondescript Severity:  Moderate Onset quality:  Sudden Timing:  Constant Progression:  Worsening Chronicity:  New Smoker: no   Associated symptoms: no chest pain, no eye discharge, no headaches and no rash     Past Medical History:  Diagnosis Date  . Abnormal Pap smear of cervix   . ANXIETY 08/30/2007  . Asthma   . Chronic back pain   . GERD (gastroesophageal reflux disease)   . History of kidney stones   . Hypertension   . 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 INSOMNIA 08/30/2007  . MIGRAINE HEADACHE 08/30/2007  . Neck pain   . Palpitations    occasionally couple times a week  . Sciatica   . Sleep apnea    cpap  . Stroke [facility])     Patient Active Problem List   Diagnosis Date Noted  . S/P lumbar fusion 02/28/2018  . Prediabetes 12/04/2017  . Current smoker 12/04/2017  . S/P lumbar spinal fusion 05/29/2017  . Rectal bleeding 02/01/2017  . Post-operative pain 12/30/2016  . Calculus of gallbladder without cholecystitis without obstruction   . Mixed hyperlipidemia 10/11/2016  . 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/10/2015  . Class 2 severe obesity due to excess calories with serious comorbidity and body mass index (BMI) of 36.0 to 36.9 in adult [facility]) 06/10/2015  . Left-sided low back pain with left-sided sciatica 05/03/2015  . Essential hypertension, benign 12/30/2012  . Esophageal reflux 12/30/2012  . 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 ANXIETY 08/30/2007  .  MIGRAINE HEADACHE 08/30/2007  . Insomnia 08/30/2007    Past Surgical History:  Procedure Laterality Date  . ABDOMINAL EXPOSURE N/A 05/29/2017   Procedure: ABDOMINAL EXPOSURE;  Surgeon: Chuck Hint, MD;  Location: [facility] OR;  Service: Vascular;  Laterality: N/A;  . ABDOMINAL HYSTERECTOMY    . ANTERIOR LUMBAR FUSION N/A 05/29/2017   Procedure: Anterior Lumbar Interbody Fusion  - Lumbar five sacral one;  Surgeon: Tia Alert, MD;  Location: [facility] OR;  Service: Neurosurgery;  Laterality: N/A;  . CARPAL TUNNEL RELEASE Bilateral   . CHOLECYSTECTOMY    . CHOLECYSTECTOMY N/A 12/29/2016   Procedure: LAPAROSCOPIC CHOLECYSTECTOMY;  Surgeon: [name], MD;  Location: [facility] ORS;  Service: General;  Laterality: N/A;  . LAMINECTOMY WITH POSTERIOR LATERAL ARTHRODESIS LEVEL 1 N/A 02/28/2018   Procedure: Laminectomy and Foraminotomy Lumbar Five-Sacral One, posterolateral fusion and fixation;  Surgeon: Tia Alert, MD;  Location: [facility] OR;  Service: Neurosurgery;  Laterality: N/A;  posterior  . LUMBAR EPIDURAL INJECTION    . STERIOD INJECTION N/A 01/10/2017   Procedure: MINOR EXPAREL INJECTION;  Surgeon: [name], MD;  Location: [facility] ORS;  Service: General;  Laterality: N/A;  . TOTAL ABDOMINAL HYSTERECTOMY W/ BILATERAL SALPINGOOPHORECTOMY N/A   . TUBAL LIGATION  2003     OB History    Gravida  4   Para  1   Term  1   Preterm      AB  3  Living  1     SAB      TAB      Ectopic      Multiple      Live Births               Home Medications    Prior to Admission medications   Medication Sig Start Date End Date Taking? Authorizing Provider  ALPRAZolam Prudy Feeler) 0.5 MG tablet TAKE 1 TABLET BY MOUTH AT BEDTIME AS NEEDED FOR SLEEP 05/01/18   [name], MD  atorvastatin (LIPITOR) 20 MG tablet TAKE 1 TABLET BY MOUTH ONCE DAILY Patient taking differently: Take 20 mg by mouth daily.  02/11/18   Roma Kayser, MD  cyclobenzaprine (FLEXERIL) 10 MG tablet  Take 1 tablet (10 mg total) by mouth 3 (three) times daily as needed. for muscle spams 02/28/18   Tia Alert, MD  fluticasone [facility]) 50 MCG/ACT nasal spray Place 1 spray into both nostrils daily. 06/07/18   Loren Racer, MD  gabapentin (NEURONTIN) 300 MG capsule TAKE 1 CAPSULE BY MOUTH IN THE MORNING AND 1 CAPSULE IN THE AFTERNOON AND 2 CAPSULES AT BEDTIME 09/03/17   Campbell Riches, NP  HYDROcodone-acetaminophen (NORCO) 10-325 MG tablet Take 1 tablet by mouth every 6 (six) hours as needed. One per day    Tia Alert, MD  levofloxacin (LEVAQUIN) 500 MG tablet Take 1 tablet (500 mg total) by mouth daily. 06/10/18   Bethann Berkshire, MD  levothyroxine (SYNTHROID, LEVOTHROID) 175 MCG tablet Take 1 tablet (175 mcg total) by mouth daily before breakfast. 12/04/17   Nida, Denman George, MD  lisinopril (PRINIVIL,ZESTRIL) 10 MG tablet TAKE 1 TABLET BY MOUTH ONCE DAILY Patient taking differently: Take 10 mg by mouth daily.  01/21/18   Roma Kayser, MD  loratadine (CLARITIN) 10 MG tablet Take 1 tablet (10 mg total) by mouth daily. 06/07/18   Loren Racer, MD  losartan (COZAAR) 50 MG tablet Take 50 mg by mouth daily.     [provider]  ondansetron (ZOFRAN-ODT) 4 MG disintegrating tablet DISSOLVE 1 TABLET IN MOUTH EVERY 8 HOURS AS NEEDED FOR NAUSEA AND VOMITING Patient taking differently: Take 4 mg by mouth every 8 (eight) hours as needed for nausea or vomiting.  11/01/17   Babs Sciara, MD  pantoprazole (PROTONIX) 40 MG tablet 1 PO 30 MINUTES PRIOR TO MEALS QD Patient taking differently: Take 40 mg by mouth daily before breakfast. 30 MINUTES PRIOR TO MEAL 03/29/17   Fields, Darleene Cleaver, MD  predniSONE (DELTASONE) 20 MG tablet 3 tabs po day one, then 2 po daily x 4 days 06/07/18   Loren Racer, MD    Family History Family History  Problem Relation Age of Onset  . Hypertension Mother   . Thyroid disease Neg Hx     Social History Social History   Tobacco Use  .  Smoking status: Current Every Day Smoker    Packs/day: 1.00    Years: 26.00    Pack years: 26.00    Types: Cigarettes  . Smokeless tobacco: Former Neurosurgeon  . Tobacco comment: currently 1 pack or less  Substance Use Topics  . Alcohol use: Yes    Alcohol/week: 0.0 standard drinks    Comment: occasional  . Drug use: Yes    Types: Marijuana     Allergies   Adhesive [tape] and Celexa [citalopram hydrobromide]   Review of Systems Review of Systems  Constitutional: Negative for appetite change and fatigue.  HENT: Negative for  congestion, ear discharge and sinus pressure.   Eyes: Negative for discharge.  Respiratory: Positive for cough.   Cardiovascular: Negative for chest pain.  Gastrointestinal: Negative for abdominal pain and diarrhea.  Genitourinary: Negative for frequency and hematuria.  Musculoskeletal: Negative for back pain.  Skin: Negative for rash.  Neurological: Negative for seizures and headaches.  Psychiatric/Behavioral: Negative for hallucinations.     Physical Exam Updated Vital Signs BP 123/79 (BP Location: Right Arm)   Pulse 79   Temp 98.2 F (36.8 C) (Oral)   Resp 15   Ht 5\' 7"  (1.702 m)   Wt 104.3 kg   LMP 02/25/2016   SpO2 96%   BMI 36.02 kg/m   Physical Exam Vitals signs and nursing note reviewed.  Constitutional:      Appearance: She is well-developed.  HENT:     Head: Normocephalic.     Nose: Nose normal.  Eyes:     General: No scleral icterus.    Conjunctiva/sclera: Conjunctivae normal.  Neck:     Musculoskeletal: Neck supple.     Thyroid: No thyromegaly.  Cardiovascular:     Rate and Rhythm: Normal rate and regular rhythm.     Heart sounds: No murmur. No friction rub. No gallop.   Pulmonary:     Breath sounds: No stridor. No wheezing or rales.  Chest:     Chest wall: No tenderness.  Abdominal:     General: There is no distension.     Tenderness: There is no abdominal tenderness. There is no rebound.  Musculoskeletal: Normal range  of motion.  Lymphadenopathy:     Cervical: No cervical adenopathy.  Skin:    Findings: No erythema or rash.  Neurological:     Mental Status: She is alert and oriented to person, place, and time.     Motor: No abnormal muscle tone.     Coordination: Coordination normal.  Psychiatric:        Behavior: Behavior normal.      ED Treatments / Results  Labs (all labs ordered are listed, but only abnormal results are displayed) Labs Reviewed  CBC WITH DIFFERENTIAL/PLATELET - Abnormal; Notable for the following components:      Result Value   WBC 12.1 (*)    Lymphs Abs 4.1 (*)    All other components within normal limits  COMPREHENSIVE METABOLIC PANEL - Abnormal; Notable for the following components:   Calcium 8.4 (*)    Total Bilirubin 0.2 (*)    All other components within normal limits    EKG None  Radiology Dg Chest 2 View  Result Date: 06/10/2018 CLINICAL DATA:  Cough, shortness of breath, vomiting on Saturday EXAM: CHEST - 2 VIEW COMPARISON:  06/07/2018 FINDINGS: Upper normal heart size. Mediastinal contours and pulmonary vascularity normal. New mild opacity at lateral RIGHT lung base with ill definition of diaphragmatic silhouette question RIGHT middle lobe atelectasis versus infiltrate. Remaining lungs clear. No pleural effusion or pneumothorax. Osseous structures unremarkable. IMPRESSION: New subtle opacity at lateral RIGHT lung base with loss of RIGHT diaphragmatic silhouette suspicious for atelectasis or developing pneumonia in RIGHT middle lobe. Electronically Signed   By: Ulyses Southward M.D.   On: 06/10/2018 10:21    Procedures Procedures (including critical care time)  Medications Ordered in ED Medications  ipratropium-albuterol (DUONEB) 0.5-2.5 (3) MG/3ML nebulizer solution 3 mL (3 mLs Nebulization Given 06/10/18 1007)  albuterol (PROVENTIL) (2.5 MG/3ML) 0.083% nebulizer solution 2.5 mg (2.5 mg Nebulization Given 06/10/18 1005)  magnesium sulfate IVPB 2 g 50  mL (0 g  Intravenous Stopped 06/10/18 1134)  methylPREDNISolone sodium succinate (SOLU-MEDROL) 125 mg/2 mL injection 125 mg (125 mg Intravenous Given 06/10/18 1005)     Initial Impression / Assessment and Plan / ED Course  I have reviewed the triage vital signs and the nursing notes.  Pertinent labs & imaging results that were available during my care of the patient were reviewed by me and considered in my medical decision making (see chart for details).   Patient improved with neb treatments and steroids.  Chest x-ray shows possible pneumonia.  She will be sent home with Levaquin and will continue her prednisone and follow-up with her PCP.  Patient also has albuterol inhaler       Final Clinical Impressions(s) / ED Diagnoses   Final diagnoses:  Cough    ED Discharge Orders         Ordered    levofloxacin (LEVAQUIN) 500 MG tablet  Daily     06/10/18 1224           Bethann Berkshire, MD 06/10/18 1235

## 2018-06-10 NOTE — ED Triage Notes (Signed)
Patient complaining of cough, shortness of breath, and congestion x 1 week. States she was treated here recently for same.

## 2018-06-10 NOTE — Telephone Encounter (Signed)
Pt contacted office this morning. Pt was seen in office on Friday by Dr.Scott for cough. Pt was sent to ER. Pt states that she has not gotten any better. Pt still having cough, shortness of breath, and vomited on Saturday morning. Pt was given multiple breathing treatments and steroid shot at ER. Pt states when she left ER she felt ok but Saturday morning, she began to feel bad again. Pt was told to stay home until symptoms resolve. Spoke with provider and provider stated that she would need to go back to ER. Informed patient that she would need to go back to ER due to symptoms not better. Pt verbalized understanding.

## 2018-06-10 NOTE — Discharge Instructions (Signed)
Follow-up with your doctor this week for recheck. 

## 2018-06-11 ENCOUNTER — Telehealth (INDEPENDENT_AMBULATORY_CARE_PROVIDER_SITE_OTHER): Payer: 59 | Admitting: "Endocrinology

## 2018-06-11 DIAGNOSIS — R7303 Prediabetes: Secondary | ICD-10-CM

## 2018-06-11 DIAGNOSIS — E782 Mixed hyperlipidemia: Secondary | ICD-10-CM

## 2018-06-11 DIAGNOSIS — E890 Postprocedural hypothyroidism: Secondary | ICD-10-CM | POA: Diagnosis not present

## 2018-06-11 NOTE — Patient Instructions (Addendum)
Endocrinology Telephone Visit Follow up Note -During COVID -19 Pandemic    Subjective:    Patient ID: Teresa Vincent, female    DOB: 06/01/1980, PCP Luking, William S, MD   Past Medical History:  Diagnosis Date  . Abnormal Pap smear of cervix   . ANXIETY 08/30/2007  . Asthma   . Chronic back pain   . GERD (gastroesophageal reflux disease)   . History of kidney stones   . Hypertension   . 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 INSOMNIA 08/30/2007  . MIGRAINE HEADACHE 08/30/2007  . Neck pain   . Palpitations    occasionally couple times a week  . Sciatica   . Sleep apnea    cpap  . Stroke (HCC)    Past Surgical History:  Procedure Laterality Date  . ABDOMINAL EXPOSURE N/A 05/29/2017   Procedure: ABDOMINAL EXPOSURE;  Surgeon: Dickson, Christopher S, MD;  Location: [facility] OR;  Service: Vascular;  Laterality: N/A;  . ABDOMINAL HYSTERECTOMY    . ANTERIOR LUMBAR FUSION N/A 05/29/2017   Procedure: Anterior Lumbar Interbody Fusion  - Lumbar five sacral one;  Surgeon: Jones, David S, MD;  Location: [facility] OR;  Service: Neurosurgery;  Laterality: N/A;  . CARPAL TUNNEL RELEASE Bilateral   . CHOLECYSTECTOMY    . CHOLECYSTECTOMY N/A 12/29/2016   Procedure: LAPAROSCOPIC CHOLECYSTECTOMY;  Surgeon: Bridges, Lindsay C, MD;  Location: [facility] ORS;  Service: General;  Laterality: N/A;  . LAMINECTOMY WITH POSTERIOR LATERAL ARTHRODESIS LEVEL 1 N/A 02/28/2018   Procedure: Laminectomy and Foraminotomy Lumbar Five-Sacral One, posterolateral fusion and fixation;  Surgeon: Jones, David S, MD;  Location: [facility] OR;  Service: Neurosurgery;  Laterality: N/A;  posterior  . LUMBAR EPIDURAL INJECTION    . STERIOD INJECTION N/A 01/10/2017   Procedure: MINOR EXPAREL INJECTION;  Surgeon: Bridges, Lindsay C, MD;  Location: [facility] ORS;  Service: General;  Laterality: N/A;  . TOTAL ABDOMINAL HYSTERECTOMY W/ BILATERAL SALPINGOOPHORECTOMY N/A   . TUBAL LIGATION  2003   Social  History   Socioeconomic History  . Marital status: Single    Spouse name: Not on file  . Number of children: Not on file  . Years of education: Not on file  . Highest education level: Not on file  Occupational History  . Occupation: Doesn't work outside the home  Social Needs  . Financial resource strain: Not on file  . Food insecurity:    Worry: Not on file    Inability: Not on file  . Transportation needs:    Medical: Not on file    Non-medical: Not on file  Tobacco Use  . Smoking status: Current Every Day Smoker    Packs/day: 1.00    Years: 26.00    Pack years: 26.00    Types: Cigarettes  . Smokeless tobacco: Former User  . Tobacco comment: currently 1 pack or less  Substance and Sexual Activity  . Alcohol use: Yes    Alcohol/week: 0.0 standard drinks    Comment: occasional  . Drug use: Yes    Types: Marijuana  . Sexual activity: Not Currently    Birth control/protection: Surgical  Lifestyle  . Physical activity:    Days per week: Not on file    Minutes per session: Not on file  . Stress: Not on file  Relationships  . Social connections:    Talks on phone: Not on file    Gets together: Not on file    Attends religious service: Not on file    Active member of   club or organization: Not on file    Attends meetings of clubs or organizations: Not on file    Relationship status: Not on file  Other Topics Concern  . Not on file  Social History Narrative  . Not on file   Outpatient Encounter Medications as of 06/11/2018  Medication Sig  . ALPRAZolam (XANAX) 0.5 MG tablet TAKE 1 TABLET BY MOUTH AT BEDTIME AS NEEDED FOR SLEEP  . atorvastatin (LIPITOR) 20 MG tablet TAKE 1 TABLET BY MOUTH ONCE DAILY (Patient taking differently: Take 20 mg by mouth daily. )  . cyclobenzaprine (FLEXERIL) 10 MG tablet Take 1 tablet (10 mg total) by mouth 3 (three) times daily as needed. for muscle spams  . fluticasone (FLONASE) 50 MCG/ACT nasal spray Place 1 spray into both nostrils daily.   . gabapentin (NEURONTIN) 300 MG capsule TAKE 1 CAPSULE BY MOUTH IN THE MORNING AND 1 CAPSULE IN THE AFTERNOON AND 2 CAPSULES AT BEDTIME  . HYDROcodone-acetaminophen (NORCO) 10-325 MG tablet Take 1 tablet by mouth every 6 (six) hours as needed. One per day  . levofloxacin (LEVAQUIN) 500 MG tablet Take 1 tablet (500 mg total) by mouth daily.  . levothyroxine (SYNTHROID, LEVOTHROID) 175 MCG tablet Take 1 tablet (175 mcg total) by mouth daily before breakfast.  . lisinopril (PRINIVIL,ZESTRIL) 10 MG tablet TAKE 1 TABLET BY MOUTH ONCE DAILY (Patient taking differently: Take 10 mg by mouth daily. )  . loratadine (CLARITIN) 10 MG tablet Take 1 tablet (10 mg total) by mouth daily.  . losartan (COZAAR) 50 MG tablet Take 50 mg by mouth daily.   . ondansetron (ZOFRAN-ODT) 4 MG disintegrating tablet DISSOLVE 1 TABLET IN MOUTH EVERY 8 HOURS AS NEEDED FOR NAUSEA AND VOMITING (Patient taking differently: Take 4 mg by mouth every 8 (eight) hours as needed for nausea or vomiting. )  . pantoprazole (PROTONIX) 40 MG tablet 1 PO 30 MINUTES PRIOR TO MEALS QD (Patient taking differently: Take 40 mg by mouth daily before breakfast. 30 MINUTES PRIOR TO MEAL)  . predniSONE (DELTASONE) 20 MG tablet 3 tabs po day one, then 2 po daily x 4 days   Facility-Administered Encounter Medications as of 06/11/2018  Medication  . methylPREDNISolone acetate (DEPO-MEDROL) injection 40 mg   ALLERGIES: Allergies  Allergen Reactions  . Adhesive [Tape] Other (See Comments)    Skin irritation, patient prefers paper tape.   . Celexa [Citalopram Hydrobromide] Other (See Comments)    Make patient too carefree   VACCINATION STATUS: Immunization History  Administered Date(s) Administered  . Influenza-Unspecified 12/11/2015, 12/13/2016  . Td 05/11/2006  . Tdap 12/13/2016    HPI  Teresa Vincent is a 37-year-old female patient with medical history as above.  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 She was treated with various doses of levothyroxine over the years currently on 175 mcg of levothyroxine p.o. every a.m.   -  She has been more consistent in taking her thyroid hormone.   She denies palpitations, tremors, heat/cold intolerance.   -She also has hyperlipidemia on atorvastatin , prediabetes with A1c of 5.8%, and hypertension on lisinopril.  -She denies any family history of thyroid dysfunction. She denies any personal history of goiter nor family history of thyroid cancer.     Objective:       Complete Blood Count (Most recent): Lab Results    Component Value Date   WBC 12.1 (H) 06/10/2018   HGB 13.9 06/10/2018   HCT 43.0 06/10/2018   MCV 92.9 06/10/2018   PLT 260 06/10/2018   Chemistry (most recent): Lab Results  Component Value Date   NA 139 06/10/2018   K 3.6 06/10/2018   CL 105 06/10/2018   CO2 26 06/10/2018   BUN 19 06/10/2018   CREATININE 0.85 06/10/2018   Recent Results (from the past 2160 hour(s))  Hemoglobin A1c     Status: Abnormal   Collection Time: 05/27/18 10:08 AM  Result Value Ref Range   Hgb A1c MFr Bld 5.8 (H) <5.7 % of total Hgb    Comment: For someone without known diabetes, a hemoglobin  A1c value between 5.7% and 6.4% is consistent with prediabetes and should be confirmed with a  follow-up test. . For someone with known diabetes, a value <7% indicates that their diabetes is well controlled. A1c targets should be individualized based on duration of diabetes, age, comorbid conditions, and other considerations. . This assay result is consistent with an increased risk of diabetes. . Currently, no consensus exists regarding use of hemoglobin A1c for diagnosis of diabetes for children. .    Mean Plasma Glucose 120 (calc)   eAG (mmol/L) 6.6 (calc)  COMPLETE METABOLIC PANEL WITH GFR     Status: Abnormal    Collection Time: 05/27/18 10:08 AM  Result Value Ref Range   Glucose, Bld 102 (H) 65 - 99 mg/dL    Comment: .            Fasting reference interval . For someone without known diabetes, a glucose value between 100 and 125 mg/dL is consistent with prediabetes and should be confirmed with a follow-up test. .    BUN 9 7 - 25 mg/dL   Creat 0.79 0.50 - 1.10 mg/dL   GFR, Est Non African American 96 > OR = 60 mL/min/1.73m2   GFR, Est African American 111 > OR = 60 mL/min/1.73m2   BUN/Creatinine Ratio NOT APPLICABLE 6 - 22 (calc)   Sodium 141 135 - 146 mmol/L   Potassium 4.9 3.5 - 5.3 mmol/L   Chloride 106 98 - 110 mmol/L   CO2 27 20 - 32 mmol/L   Calcium 9.2 8.6 - 10.2 mg/dL   Total Protein 6.6 6.1 - 8.1 g/dL   Albumin 4.2 3.6 - 5.1 g/dL   Globulin 2.4 1.9 - 3.7 g/dL (calc)   AG Ratio 1.8 1.0 - 2.5 (calc)   Total Bilirubin 0.3 0.2 - 1.2 mg/dL   Alkaline phosphatase (APISO) 76 31 - 125 U/L   AST 13 10 - 30 U/L   ALT 10 6 - 29 U/L  TSH     Status: Abnormal   Collection Time: 05/27/18 10:08 AM  Result Value Ref Range   TSH 0.25 (L) mIU/L    Comment:           Reference Range .           > or = 20 Years  0.40-4.50 .                Pregnancy Ranges           First trimester    0.26-2.66           Second trimester   0.55-2.73           Third trimester    0.43-2.91   T4, free     Status: None   Collection   Time: 05/27/18 10:08 AM  Result Value Ref Range   Free T4 1.5 0.8 - 1.8 ng/dL  CBC with Differential/Platelet     Status: Abnormal   Collection Time: 06/10/18  9:38 AM  Result Value Ref Range   WBC 12.1 (H) 4.0 - 10.5 K/uL   RBC 4.63 3.87 - 5.11 MIL/uL   Hemoglobin 13.9 12.0 - 15.0 g/dL   HCT 43.0 36.0 - 46.0 %   MCV 92.9 80.0 - 100.0 fL   MCH 30.0 26.0 - 34.0 pg   MCHC 32.3 30.0 - 36.0 g/dL   RDW 13.4 11.5 - 15.5 %   Platelets 260 150 - 400 K/uL   nRBC 0.0 0.0 - 0.2 %   Neutrophils Relative % 58 %   Neutro Abs 7.1 1.7 - 7.7 K/uL   Lymphocytes Relative 34 %    Lymphs Abs 4.1 (H) 0.7 - 4.0 K/uL   Monocytes Relative 6 %   Monocytes Absolute 0.7 0.1 - 1.0 K/uL   Eosinophils Relative 1 %   Eosinophils Absolute 0.1 0.0 - 0.5 K/uL   Basophils Relative 0 %   Basophils Absolute 0.0 0.0 - 0.1 K/uL   Immature Granulocytes 1 %   Abs Immature Granulocytes 0.07 0.00 - 0.07 K/uL   Reactive, Benign Lymphocytes PRESENT     Comment: Performed at [facility], [address] [place], [place] [address]  Comprehensive metabolic panel     Status: Abnormal   Collection Time: 06/10/18  9:38 AM  Result Value Ref Range   Sodium 139 135 - 145 mmol/L   Potassium 3.6 3.5 - 5.1 mmol/L   Chloride 105 98 - 111 mmol/L   CO2 26 22 - 32 mmol/L   Glucose, Bld 98 70 - 99 mg/dL   BUN 19 6 - 20 mg/dL   Creatinine, Ser 0.85 0.44 - 1.00 mg/dL   Calcium 8.4 (L) 8.9 - 10.3 mg/dL   Total Protein 6.6 6.5 - 8.1 g/dL   Albumin 3.6 3.5 - 5.0 g/dL   AST 15 15 - 41 U/L   ALT 12 0 - 44 U/L   Alkaline Phosphatase 66 38 - 126 U/L   Total Bilirubin 0.2 (L) 0.3 - 1.2 mg/dL   GFR calc non Af Amer >60 >60 mL/min   GFR calc Af Amer >60 >60 mL/min   Anion gap 8 5 - 15    Comment: Performed at [facility], [address] [place], [place] [address]    Assessment & Plan:   1. 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  However, she is advised to continue her current dose of levothyroxine 175 mcg p.o. nightly.    - We discussed about the correct intake of her thyroid hormone, on empty stomach at fasting, with water, separated by at least 30 minutes from breakfast and other medications,  and separated by more than 4 hours from calcium, iron, multivitamins, acid reflux medications (PPIs). -Patient is made aware of the fact that thyroid hormone replacement is needed for life, dose to be adjusted by periodic monitoring of thyroid function tests.     2. Hyperlipidemia-  -She has responded significantly for atorvastatin treatment, with  improving LDL.  She is advised to continue atorvastatin 20 mg p.o. nightly.     3) prediabetes-A1c of 5.8% -She is not on medications.  She was previously counseled on diet and exercise.   -If her A1c remains above 6 % during her next visit, she   will be considered for metformin treatment.   - I advised patient to maintain close follow up with Luking, William S, MD for primary care needs. Follow up plan: Return in 3 months with repeat labs.    Gebre Brae Schaafsma, MD Phone: [phone]  Fax: [phone]  -  This note was partially dictated with voice recognition software. Similar sounding words can be transcribed inadequately or may not  be corrected upon review.  06/11/2018, 2:32 PM 

## 2018-06-11 NOTE — Progress Notes (Signed)
Endocrinology Telephone Visit Follow up Note -During COVID -19 Pandemic    Subjective:    Patient ID: Teresa Vincent, female    DOB: Jan 02, 1981, PCP Teresa Albert, MD   Past Medical History:  Diagnosis Date  . Abnormal Pap smear of cervix   . ANXIETY 08/30/2007  . Asthma   . Chronic back pain   . GERD (gastroesophageal reflux disease)   . History of kidney stones   . Hypertension   . 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 INSOMNIA 08/30/2007  . MIGRAINE HEADACHE 08/30/2007  . Neck pain   . Palpitations    occasionally couple times a week  . Sciatica   . Sleep apnea    cpap  . Stroke [facility])    Past Surgical History:  Procedure Laterality Date  . ABDOMINAL EXPOSURE N/A 05/29/2017   Procedure: ABDOMINAL EXPOSURE;  Surgeon: Teresa Hint, MD;  Location: [facility] OR;  Service: Vascular;  Laterality: N/A;  . ABDOMINAL HYSTERECTOMY    . ANTERIOR LUMBAR FUSION N/A 05/29/2017   Procedure: Anterior Lumbar Interbody Fusion  - Lumbar five sacral one;  Surgeon: Teresa Alert, MD;  Location[facility] OR;  Service: Neurosurgery;  Laterality: N/A;  . CARPAL TUNNEL RELEASE Bilateral   . CHOLECYSTECTOMY    . CHOLECYSTECTOMY N/A 12/29/2016   Procedure: LAPAROSCOPIC CHOLECYSTECTOMY;  Surgeon: Teresa Roers, MD;  Location: [facility] ORS;  Service: General;  Laterality: N/A;  . LAMINECTOMY WITH POSTERIOR LATERAL ARTHRODESIS LEVEL 1 N/A 02/28/2018   Procedure: Laminectomy and Foraminotomy Lumbar Five-Sacral One, posterolateral fusion and fixation;  Surgeon: Teresa Alert, MD;  Location: [facility] OR;  Service: Neurosurgery;  Laterality: N/A;  posterior  . LUMBAR EPIDURAL INJECTION    . STERIOD INJECTION N/A 01/10/2017   Procedure: MINOR EXPAREL INJECTION;  Surgeon: Teresa Roers, MD;  Location: [facility] ORS;  Service: General;  Laterality: N/A;  . TOTAL ABDOMINAL HYSTERECTOMY W/ BILATERAL SALPINGOOPHORECTOMY N/A   . TUBAL LIGATION  2003   Social  History   Socioeconomic History  . Marital status: Single    Spouse name: Not on file  . Number of children: Not on file  . Years of education: Not on file  . Highest education level: Not on file  Occupational History  . Occupation: Doesn't work outside the home  Social Needs  . Financial resource strain: Not on file  . Food insecurity:    Worry: Not on file    Inability: Not on file  . Transportation needs:    Medical: Not on file    Non-medical: Not on file  Tobacco Use  . Smoking status: Current Every Day Smoker    Packs/day: 1.00    Years: 26.00    Pack years: 26.00    Types: Cigarettes  . Smokeless tobacco: Former Neurosurgeon  . Tobacco comment: currently 1 pack or less  Substance and Sexual Activity  . Alcohol use: Yes    Alcohol/week: 0.0 standard drinks    Comment: occasional  . Drug use: Yes    Types: Marijuana  . Sexual activity: Not Currently    Birth control/protection: Surgical  Lifestyle  . Physical activity:    Days per week: Not on file    Minutes per session: Not on file  . Stress: Not on file  Relationships  . Social connections:    Talks on phone: Not on file    Gets together: Not on file    Attends religious service: Not on file    Active member of  club or organization: Not on file    Attends meetings of clubs or organizations: Not on file    Relationship status: Not on file  Other Topics Concern  . Not on file  Social History Narrative  . Not on file   Outpatient Encounter Medications as of 06/11/2018  Medication Sig  . ALPRAZolam (XANAX) 0.5 MG tablet TAKE 1 TABLET BY MOUTH AT BEDTIME AS NEEDED FOR SLEEP  . atorvastatin (LIPITOR) 20 MG tablet TAKE 1 TABLET BY MOUTH ONCE DAILY (Patient taking differently: Take 20 mg by mouth daily. )  . cyclobenzaprine (FLEXERIL) 10 MG tablet Take 1 tablet (10 mg total) by mouth 3 (three) times daily as needed. for muscle spams  . fluticasone (FLONASE) 50 MCG/ACT nasal spray Place 1 spray into both nostrils daily.   Marland Kitchen gabapentin (NEURONTIN) 300 MG capsule TAKE 1 CAPSULE BY MOUTH IN THE MORNING AND 1 CAPSULE IN THE AFTERNOON AND 2 CAPSULES AT BEDTIME  . HYDROcodone-acetaminophen (NORCO) 10-325 MG tablet Take 1 tablet by mouth every 6 (six) hours as needed. One per day  . levofloxacin (LEVAQUIN) 500 MG tablet Take 1 tablet (500 mg total) by mouth daily.  Marland Kitchen levothyroxine (SYNTHROID, LEVOTHROID) 175 MCG tablet Take 1 tablet (175 mcg total) by mouth daily before breakfast.  . lisinopril (PRINIVIL,ZESTRIL) 10 MG tablet TAKE 1 TABLET BY MOUTH ONCE DAILY (Patient taking differently: Take 10 mg by mouth daily. )  . loratadine (CLARITIN) 10 MG tablet Take 1 tablet (10 mg total) by mouth daily.  Marland Kitchen losartan (COZAAR) 50 MG tablet Take 50 mg by mouth daily.   . ondansetron (ZOFRAN-ODT) 4 MG disintegrating tablet DISSOLVE 1 TABLET IN MOUTH EVERY 8 HOURS AS NEEDED FOR NAUSEA AND VOMITING (Patient taking differently: Take 4 mg by mouth every 8 (eight) hours as needed for nausea or vomiting. )  . pantoprazole (PROTONIX) 40 MG tablet 1 PO 30 MINUTES PRIOR TO MEALS QD (Patient taking differently: Take 40 mg by mouth daily before breakfast. 30 MINUTES PRIOR TO MEAL)  . predniSONE (DELTASONE) 20 MG tablet 3 tabs po day one, then 2 po daily x 4 days   Facility-Administered Encounter Medications as of 06/11/2018  Medication  . methylPREDNISolone acetate (DEPO-MEDROL) injection 40 mg   ALLERGIES: Allergies  Allergen Reactions  . Adhesive [Tape] Other (See Comments)    Skin irritation, patient prefers paper tape.   Budd Palmer [Citalopram Hydrobromide] Other (See Comments)    Make patient too carefree   VACCINATION STATUS: Immunization History  Administered Date(s) Administered  . Influenza-Unspecified 12/11/2015, 12/13/2016  . Td 05/11/2006  . Tdap 12/13/2016    HPI  [name] is a 38 year old female patient with medical history as above.  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 She was treated with various doses of levothyroxine over the years currently on 175 mcg of levothyroxine p.o. every a.m.   -  She has been more consistent in taking her thyroid hormone.   She denies palpitations, tremors, heat/cold intolerance.   -She also has hyperlipidemia on atorvastatin , prediabetes with A1c of 5.8%, and hypertension on lisinopril.  -She denies any family history of thyroid dysfunction. She denies any personal history of goiter nor family history of thyroid cancer.     Objective:       Complete Blood Count (Most recent): Lab Results  Component Value Date   WBC 12.1 (H) 06/10/2018   HGB 13.9 06/10/2018   HCT 43.0 06/10/2018   MCV 92.9 06/10/2018   PLT 260 06/10/2018   Chemistry (most recent): Lab Results  Component Value Date   NA 139 06/10/2018   K 3.6 06/10/2018   CL 105 06/10/2018   CO2 26 06/10/2018   BUN 19 06/10/2018   CREATININE 0.85 06/10/2018   Recent Results (from the past 2160 hour(s))  Hemoglobin A1c     Status: Abnormal   Collection Time: 05/27/18 10:08 AM  Result Value Ref Range   Hgb A1c MFr Bld 5.8 (H) <5.7 % of total Hgb    Comment: For someone without known diabetes, a hemoglobin  A1c value between 5.7% and 6.4% is consistent with prediabetes and should be confirmed with a  follow-up test. . For someone with known diabetes, a value <7% indicates that their diabetes is well controlled. A1c targets should be individualized based on duration of diabetes, age, comorbid conditions, and other considerations. . This assay result is consistent with an increased risk of diabetes. . Currently, no consensus exists regarding use of hemoglobin A1c for diagnosis of diabetes for children. .    Mean Plasma Glucose 120 (calc)   eAG (mmol/L) 6.6 (calc)  COMPLETE METABOLIC PANEL WITH GFR     Status: Abnormal    Collection Time: 05/27/18 10:08 AM  Result Value Ref Range   Glucose, Bld 102 (H) 65 - 99 mg/dL    Comment: .            Fasting reference interval . For someone without known diabetes, a glucose value between 100 and 125 mg/dL is consistent with prediabetes and should be confirmed with a follow-up test. .    BUN 9 7 - 25 mg/dL   Creat 1.61 0.96 - 0.45 mg/dL   GFR, Est Non African American 96 > OR = 60 mL/min/1.57m2   GFR, Est African American 111 > OR = 60 mL/min/1.2m2   BUN/Creatinine Ratio NOT APPLICABLE 6 - 22 (calc)   Sodium 141 135 - 146 mmol/L   Potassium 4.9 3.5 - 5.3 mmol/L   Chloride 106 98 - 110 mmol/L   CO2 27 20 - 32 mmol/L   Calcium 9.2 8.6 - 10.2 mg/dL   Total Protein 6.6 6.1 - 8.1 g/dL   Albumin 4.2 3.6 - 5.1 g/dL   Globulin 2.4 1.9 - 3.7 g/dL (calc)   AG Ratio 1.8 1.0 - 2.5 (calc)   Total Bilirubin 0.3 0.2 - 1.2 mg/dL   Alkaline phosphatase (APISO) 76 31 - 125 U/L   AST 13 10 - 30 U/L   ALT 10 6 - 29 U/L  TSH     Status: Abnormal   Collection Time: 05/27/18 10:08 AM  Result Value Ref Range   TSH 0.25 (L) mIU/L    Comment:           Reference Range .           > or = 20 Years  0.40-4.50 .                Pregnancy Ranges           First trimester    0.26-2.66           Second trimester   0.55-2.73           Third trimester    0.43-2.91   T4, free     Status: None   Collection  Time: 05/27/18 10:08 AM  Result Value Ref Range   Free T4 1.5 0.8 - 1.8 ng/dL  CBC with Differential/Platelet     Status: Abnormal   Collection Time: 06/10/18  9:38 AM  Result Value Ref Range   WBC 12.1 (H) 4.0 - 10.5 K/uL   RBC 4.63 3.87 - 5.11 MIL/uL   Hemoglobin 13.9 12.0 - 15.0 g/dL   HCT 16.143.0 09.636.0 - 04.546.0 %   MCV 92.9 80.0 - 100.0 fL   MCH 30.0 26.0 - 34.0 pg   MCHC 32.3 30.0 - 36.0 g/dL   RDW 40.913.4 81.111.5 - 91.415.5 %   Platelets 260 150 - 400 K/uL   nRBC 0.0 0.0 - 0.2 %   Neutrophils Relative % 58 %   Neutro Abs 7.1 1.7 - 7.7 K/uL   Lymphocytes Relative 34 %    Lymphs Abs 4.1 (H) 0.7 - 4.0 K/uL   Monocytes Relative 6 %   Monocytes Absolute 0.7 0.1 - 1.0 K/uL   Eosinophils Relative 1 %   Eosinophils Absolute 0.1 0.0 - 0.5 K/uL   Basophils Relative 0 %   Basophils Absolute 0.0 0.0 - 0.1 K/uL   Immature Granulocytes 1 %   Abs Immature Granulocytes 0.07 0.00 - 0.07 K/uL   Reactive, Benign Lymphocytes PRESENT     Comment: Performed at [facility], [address] [place], [place] [address]  Comprehensive metabolic panel     Status: Abnormal   Collection Time: 06/10/18  9:38 AM  Result Value Ref Range   Sodium 139 135 - 145 mmol/L   Potassium 3.6 3.5 - 5.1 mmol/L   Chloride 105 98 - 111 mmol/L   CO2 26 22 - 32 mmol/L   Glucose, Bld 98 70 - 99 mg/dL   BUN 19 6 - 20 mg/dL   Creatinine, Ser 6.210.85 0.44 - 1.00 mg/dL   Calcium 8.4 (L) 8.9 - 10.3 mg/dL   Total Protein 6.6 6.5 - 8.1 g/dL   Albumin 3.6 3.5 - 5.0 g/dL   AST 15 15 - 41 U/L   ALT 12 0 - 44 U/L   Alkaline Phosphatase 66 38 - 126 U/L   Total Bilirubin 0.2 (L) 0.3 - 1.2 mg/dL   GFR calc non Af Amer >60 >60 mL/min   GFR calc Af Amer >60 >60 mL/min   Anion gap 8 5 - 15    Comment: Performed at [facility], [address] [place], [place] [address]    Assessment & Plan:   1. 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  However, she is advised to continue her current dose of levothyroxine 175 mcg p.o. nightly.    - We discussed about the correct intake of her thyroid hormone, on empty stomach at fasting, with water, separated by at least 30 minutes from breakfast and other medications,  and separated by more than 4 hours from calcium, iron, multivitamins, acid reflux medications (PPIs). -Patient is made aware of the fact that thyroid hormone replacement is needed for life, dose to be adjusted by periodic monitoring of thyroid function tests.     2. Hyperlipidemia-  -She has responded significantly for atorvastatin treatment, with  improving LDL.  She is advised to continue atorvastatin 20 mg p.o. nightly.     3) prediabetes-A1c of 5.8% -She is not on medications.  She was previously counseled on diet and exercise.   -If her A1c remains above 6 % during her next visit, she  will be considered for metformin treatment.   - I advised patient to maintain close follow up with Teresa Albert, MD for primary care needs. Follow up plan: Return in 3 months with repeat labs.    Marquis Lunch, MD Phone: [phone]  Fax: [phone]  -  This note was partially dictated with voice recognition software. Similar sounding words can be transcribed inadequately or may not  be corrected upon review.  06/11/2018, 2:32 PM

## 2018-06-12 ENCOUNTER — Telehealth: Payer: Self-pay | Admitting: Family Medicine

## 2018-06-12 ENCOUNTER — Encounter: Payer: Self-pay | Admitting: Family Medicine

## 2018-06-12 NOTE — Telephone Encounter (Signed)
Please extend work note. Thank you

## 2018-06-12 NOTE — Telephone Encounter (Signed)
Please advise 

## 2018-06-12 NOTE — Telephone Encounter (Signed)
Ok 7 more d

## 2018-06-12 NOTE — Telephone Encounter (Signed)
Work note is up front for pick up.She will pick up on 06/19/2018.

## 2018-06-12 NOTE — Telephone Encounter (Signed)
Patient is requesting extended work note.She was seen here 3/20 and sent to ER.She was diagnosis with pneumonia,she got a work note until 3/26 but they told her to call primary care doctor to get extended note due to she is still coughing but no fever.She states cant go back to work with cough. Advise on what date to put on note to return to work.

## 2018-06-19 DIAGNOSIS — G4733 Obstructive sleep apnea (adult) (pediatric): Secondary | ICD-10-CM | POA: Diagnosis not present

## 2018-07-20 ENCOUNTER — Other Ambulatory Visit: Payer: Self-pay | Admitting: "Endocrinology

## 2018-07-23 DIAGNOSIS — Z79899 Other long term (current) drug therapy: Secondary | ICD-10-CM | POA: Diagnosis not present

## 2018-07-23 DIAGNOSIS — M5416 Radiculopathy, lumbar region: Secondary | ICD-10-CM | POA: Diagnosis not present

## 2018-07-23 DIAGNOSIS — I10 Essential (primary) hypertension: Secondary | ICD-10-CM | POA: Diagnosis not present

## 2018-07-30 DIAGNOSIS — M4317 Spondylolisthesis, lumbosacral region: Secondary | ICD-10-CM | POA: Diagnosis not present

## 2018-07-30 DIAGNOSIS — M5416 Radiculopathy, lumbar region: Secondary | ICD-10-CM | POA: Diagnosis not present

## 2018-08-02 ENCOUNTER — Other Ambulatory Visit: Payer: Self-pay | Admitting: Student

## 2018-08-02 DIAGNOSIS — M5416 Radiculopathy, lumbar region: Secondary | ICD-10-CM

## 2018-08-09 ENCOUNTER — Telehealth: Payer: Self-pay

## 2018-08-09 NOTE — Telephone Encounter (Signed)
Spoke with patient to screen her medications and allergies prior to being scheduled for a myelogram.  She was informed she will be here 2-2.5 hours, needs a driver and needs to be on strict bedrest for 24 hours after the procedure.  There are no medications she needs to hold.

## 2018-08-11 IMAGING — US US ABDOMEN LIMITED
1 series · 14 of 25 positions shown · non-contrast
Comparison: CT abdomen pelvis - 12/11/2016

CLINICAL DATA: Right upper quadrant abdominal pain for the past 3
weeks. Questionable gallstones seen on recent CT scan.

EXAM:
ULTRASOUND ABDOMEN LIMITED RIGHT UPPER QUADRANT

[Series 1: us abdomen limited · 0.22mm/px · 14 of 82 slices shown]
[im 1/82]
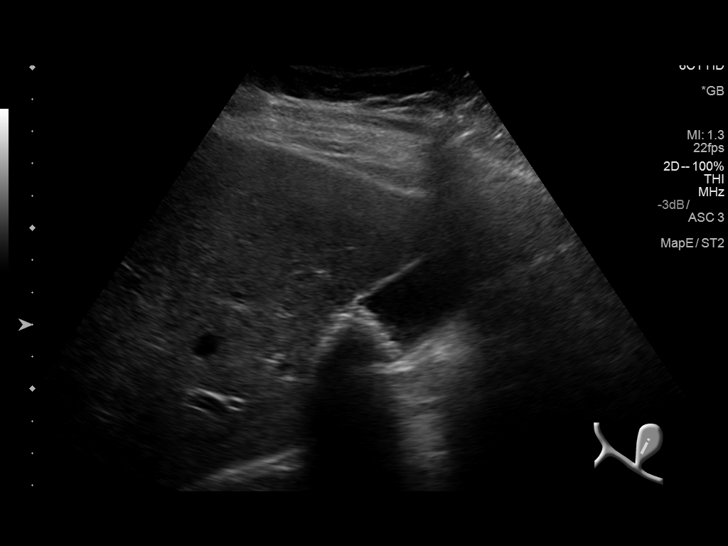
[im 7/82]
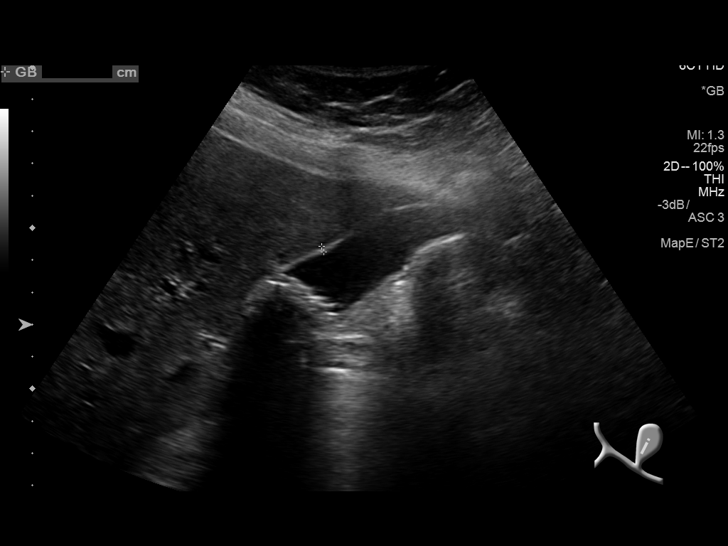
[im 14/82]
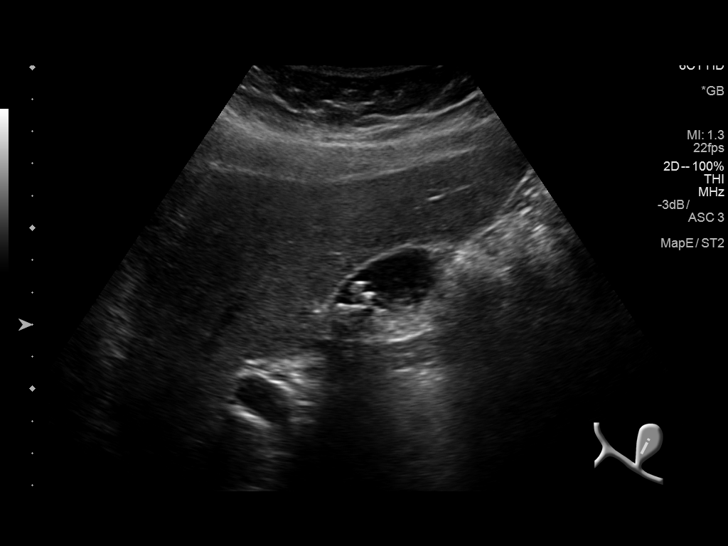
[im 21/82]
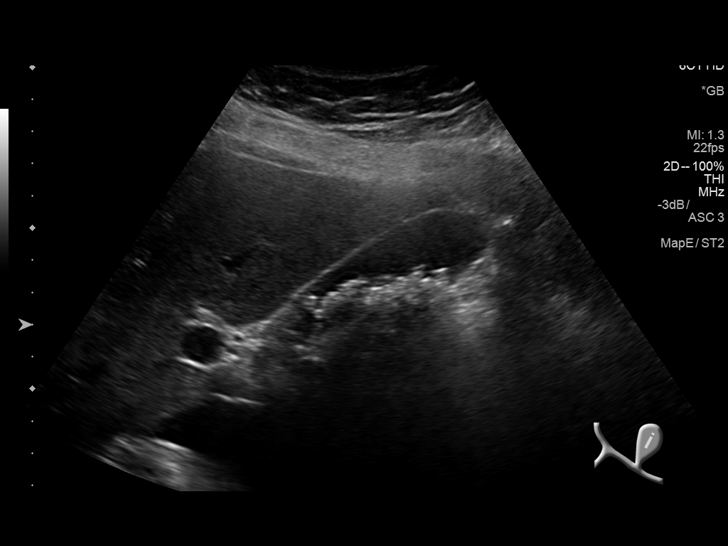
[im 28/82]
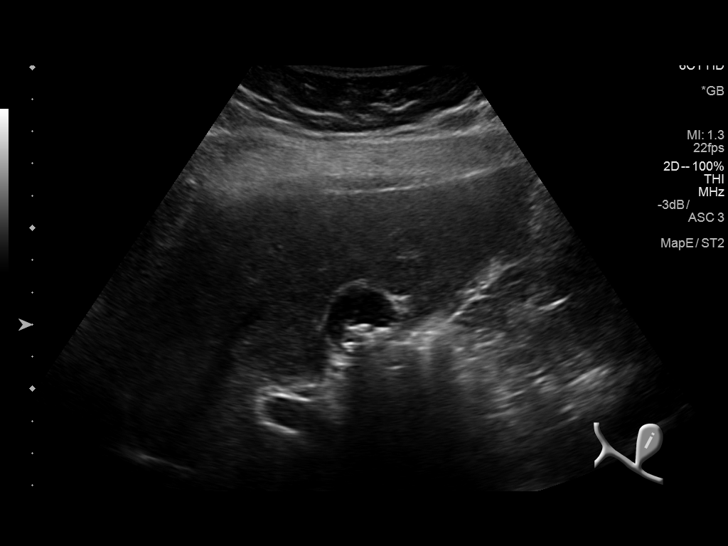
[im 31/82]
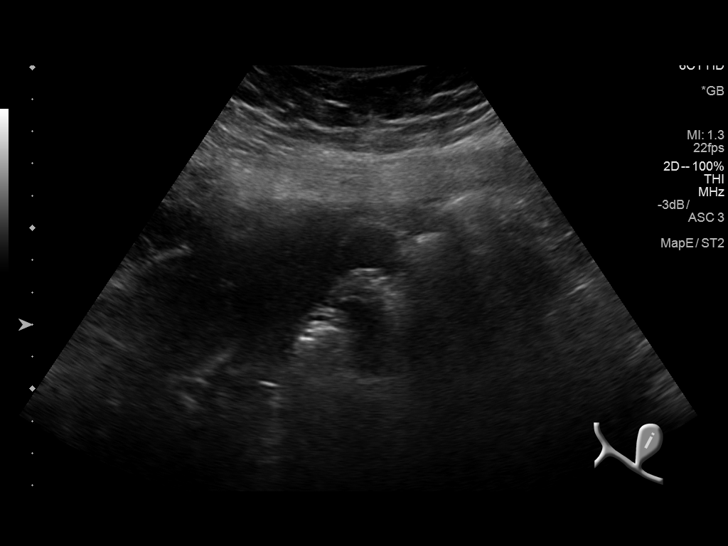
[im 38/82]
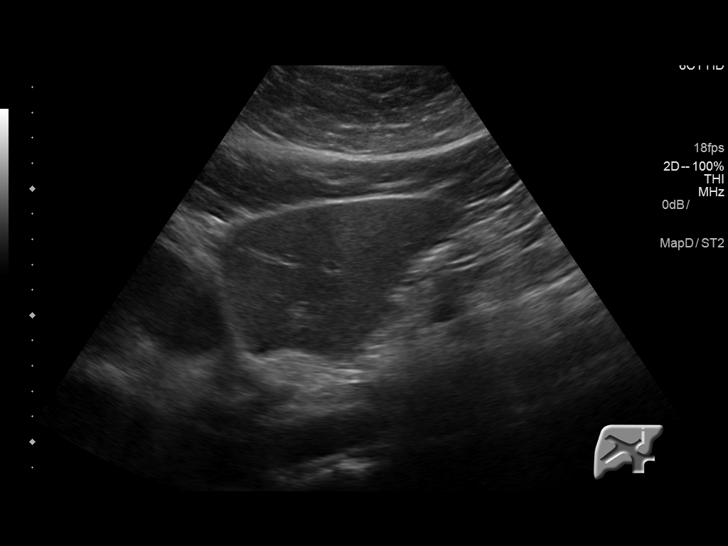
[im 44/82]
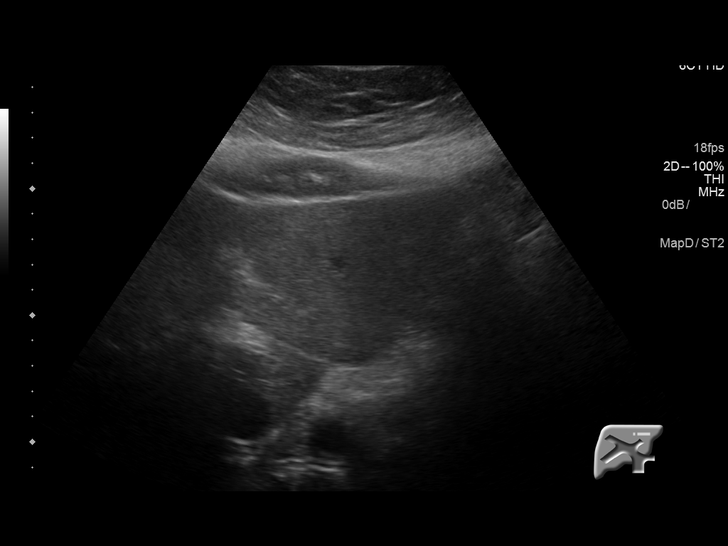
[im 51/82]
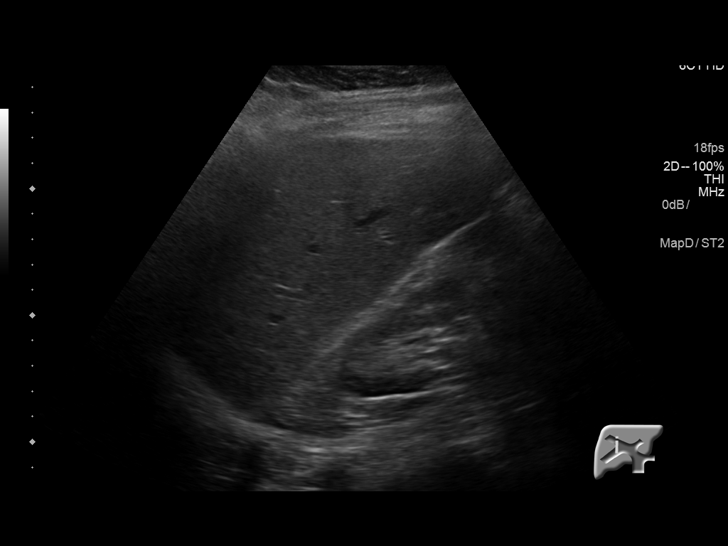
[im 55/82]
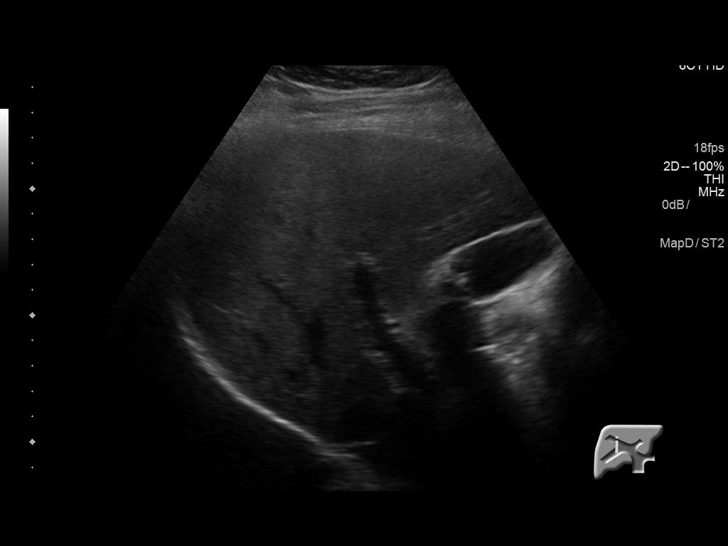
[im 61/82]
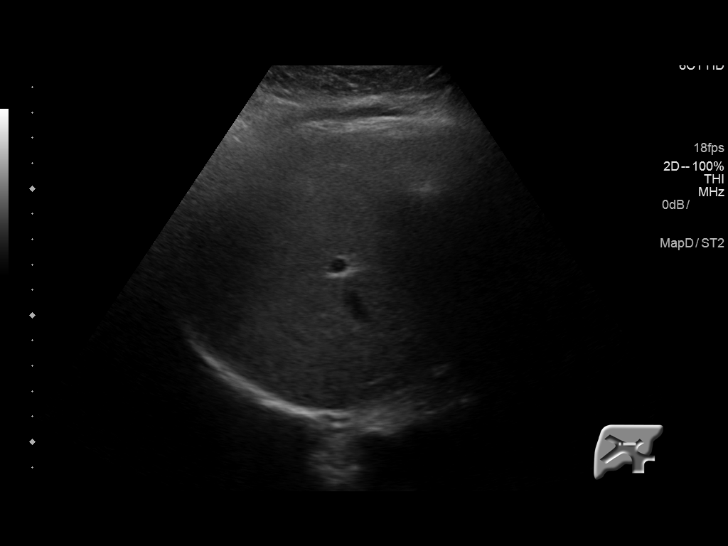
[im 68/82]
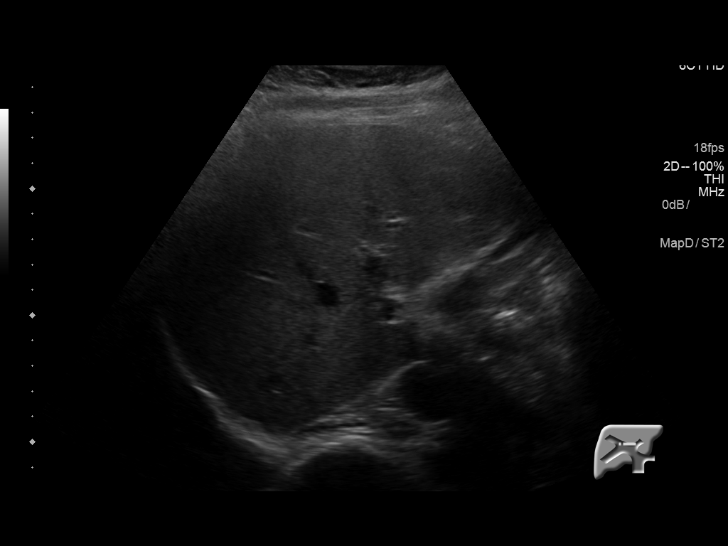
[im 75/82]
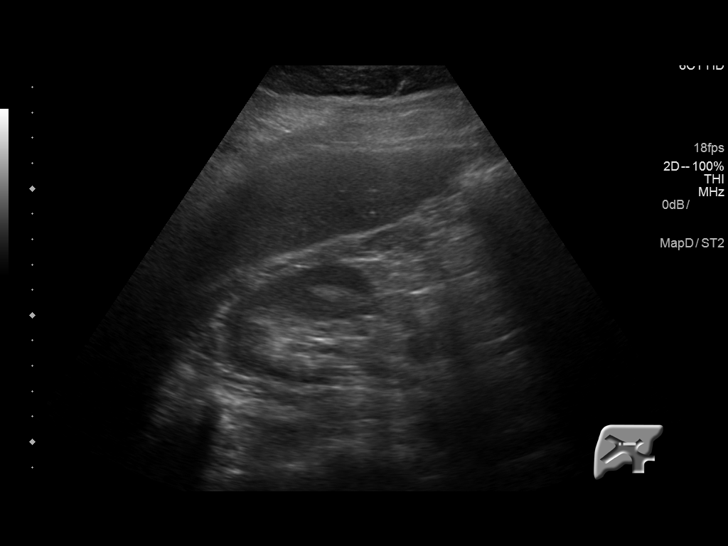
[im 82/82]
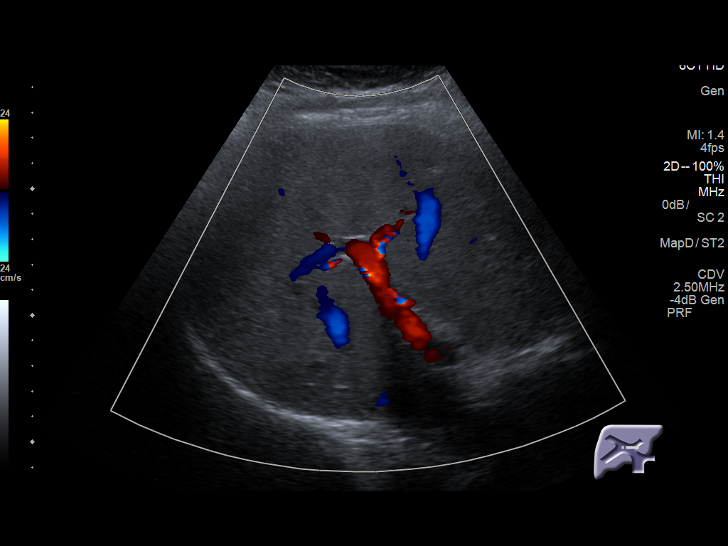

[14 of 25 positions shown; findings below may reference images not displayed]

FINDINGS: Gallbladder:

There are multiple echogenic densely shadowing stones and biliary
sludge within the neck of an otherwise normal-appearing gallbladder
(image 26). Dominant echogenic stone measures approximately 1.4 cm
in diameter (image 11). No gallbladder wall thickening or
pericholecystic fluid. Negative sonographic Murphy's sign.

Common bile duct:

Diameter: Normal in size measuring 3.3 mm in diameter

Liver:

Normal sonographic appearance of the liver. No discrete hepatic
lesions. No intrahepatic biliary ductal dilatation. No ascites.
Portal vein is patent on color Doppler imaging with normal direction
of blood flow towards the liver.
IMPRESSION: Extensive cholelithiasis without evidence of cholecystitis. Further
evaluation could be performed with nuclear medicine HIDA scan as
clinically indicated.

## 2018-09-09 ENCOUNTER — Ambulatory Visit
Admission: RE | Admit: 2018-09-09 | Discharge: 2018-09-09 | Disposition: A | Discharge status: Home / Self Care | Payer: 59 | Source: Ambulatory Visit | Attending: Student | Admitting: Student

## 2018-09-09 VITALS — BP 139/86 | HR 78

## 2018-09-09 DIAGNOSIS — M5416 Radiculopathy, lumbar region: Secondary | ICD-10-CM

## 2018-09-09 DIAGNOSIS — Z981 Arthrodesis status: Secondary | ICD-10-CM

## 2018-09-09 MED ORDER — DIAZEPAM 5 MG PO TABS
10.0000 mg | ORAL_TABLET | Freq: Once | ORAL | Status: AC
  Administered 2018-09-09: 09:00:00 5 mg via ORAL

## 2018-09-09 MED ORDER — IOPAMIDOL (ISOVUE-M 200) INJECTION 41%
15.0000 mL | Freq: Once | INTRAMUSCULAR | Status: AC
  Administered 2018-09-09: 15 mL via INTRATHECAL

## 2018-09-09 NOTE — Discharge Instructions (Signed)

## 2018-09-16 ENCOUNTER — Telehealth: Payer: Self-pay | Admitting: "Endocrinology

## 2018-09-16 MED ORDER — LEVOTHYROXINE SODIUM 175 MCG PO TABS: ORAL_TABLET | ORAL | 2 refills | Status: DC

## 2018-09-16 NOTE — Telephone Encounter (Signed)
Patient needs refill on EUTHYROX 175 MCG tablet. [facility] [place]

## 2018-09-17 ENCOUNTER — Ambulatory Visit: Payer: 59 | Admitting: "Endocrinology

## 2018-09-18 ENCOUNTER — Telehealth: Payer: Self-pay | Admitting: Family Medicine

## 2018-09-18 NOTE — Telephone Encounter (Signed)
Left message to return call 

## 2018-09-18 NOTE — Telephone Encounter (Signed)
Pt returned call. Informed pt that provider would not write a note because he can only write for folks who have serious chronic lung disease.  Pt would like suggestions on what she can do. Pt states that she becomes dizzy, her words begin to slur and "if you was to look at her it would look like she is having a stroke". Pt states she also feels nauseous at times pt states after an hour with mask on she begins to feel dizzy. Pt has mask on from 10-5 with a 30 minute break. Please advise. Thank you

## 2018-09-18 NOTE — Telephone Encounter (Signed)
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  Patient says wearing the mask all day at work makes her feel sick

## 2018-09-18 NOTE — Telephone Encounter (Signed)
My previous advice stands

## 2018-09-18 NOTE — Telephone Encounter (Signed)
No, I can only write for folks who have serious chronic lung disease

## 2018-09-18 NOTE — Telephone Encounter (Signed)
Where does she work at and how much is she around others? Does she realize masks are mant to protect others as muchas they are meant to protect the wearer?

## 2018-09-18 NOTE — Telephone Encounter (Signed)
Pt wants Dr. Richardson Landry to write a note stating she can not wear a mask at work. She states after wearing the mask for a while she becomes light headed and dizzy to the point she cant think straight to talk and has to sit down. She states she has no problem wearing a mask for a short period time like going to the grocery store and such. But it is very difficult for her to wear it during her work shift which is 10-5.   CB# [phone].  Pt has to be at work at [employer]

## 2018-09-19 NOTE — Telephone Encounter (Signed)
Pt returned call and verbalized understanding  

## 2018-09-27 ENCOUNTER — Other Ambulatory Visit: Payer: Self-pay | Admitting: Gastroenterology

## 2018-10-11 LAB — TSH: TSH: 0.67 mIU/L

## 2018-10-11 LAB — T4, FREE: Free T4: 1.2 ng/dL (ref 0.8–1.8)

## 2018-10-17 ENCOUNTER — Ambulatory Visit (INDEPENDENT_AMBULATORY_CARE_PROVIDER_SITE_OTHER): Payer: 59 | Admitting: "Endocrinology

## 2018-10-17 ENCOUNTER — Encounter: Payer: Self-pay | Admitting: "Endocrinology

## 2018-10-17 DIAGNOSIS — R7303 Prediabetes: Secondary | ICD-10-CM

## 2018-10-17 DIAGNOSIS — E782 Mixed hyperlipidemia: Secondary | ICD-10-CM

## 2018-10-17 DIAGNOSIS — E890 Postprocedural hypothyroidism: Secondary | ICD-10-CM

## 2018-10-17 MED ORDER — LEVOTHYROXINE SODIUM 175 MCG PO TABS: ORAL_TABLET | ORAL | 2 refills | Status: DC

## 2018-10-17 NOTE — Progress Notes (Signed)
10/17/2018                                Endocrinology Telehealth Visit Follow up Note -During COVID -19 Pandemic  I connected with Teresa Vincent on 10/17/2018   by telephone and verified that I am speaking with the correct person using two identifiers. Teresa Vincent, 03/22/1980. she has verbally consented to this visit. All issues noted in this document were discussed and addressed. The format was not optimal for physical exam.   Subjective:    Patient ID: Teresa Vincent, female    DOB: 02/17/1981, PCP Merlyn AlbertLuking, William S, MD   Past Medical History:  Diagnosis Date  . Abnormal Pap smear of cervix   . ANXIETY 08/30/2007  . Asthma   . Chronic back pain   . GERD (gastroesophageal reflux disease)   . History of kidney stones   . Hypertension   . 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 INSOMNIA 08/30/2007  . MIGRAINE HEADACHE 08/30/2007  . Neck pain   . Palpitations    occasionally couple times a week  . Sciatica   . Sleep apnea    cpap  . Stroke [facility](HCC)     Past Surgical History:  Procedure Laterality Date  . ABDOMINAL EXPOSURE N/A 05/29/2017   Procedure: ABDOMINAL EXPOSURE;  Surgeon: Chuck Hintickson, Christopher S, MD;  [facility];  Service: Vascular;  Laterality: N/A;  . ABDOMINAL HYSTERECTOMY    . ANTERIOR LUMBAR FUSION N/A 05/29/2017   Procedure: Anterior Lumbar Interbody Fusion  - Lumbar five sacral one;  Surgeon: Tia AlertJones, David S, MD;  [facility];  Service: Neurosurgery;  Laterality: N/A;  . CARPAL TUNNEL RELEASE Bilateral   . CHOLECYSTECTOMY    . CHOLECYSTECTOMY N/A 12/29/2016   Procedure: LAPAROSCOPIC CHOLECYSTECTOMY;  Surgeon: Lucretia RoersBridges, Lindsay C, MD;  Location: [facility] ORS;  Service: General;  Laterality: N/A;  . LAMINECTOMY WITH POSTERIOR LATERAL ARTHRODESIS LEVEL 1 N/A 02/28/2018   Procedure: Laminectomy and Foraminotomy Lumbar Five-Sacral One, posterolateral fusion and fixation;  Surgeon: Tia AlertJones, David S, MD;  [facility];  Service: Neurosurgery;   Laterality: N/A;  posterior  . LUMBAR EPIDURAL INJECTION    . STERIOD INJECTION N/A 01/10/2017   Procedure: MINOR EXPAREL INJECTION;  Surgeon: Lucretia RoersBridges, Lindsay C, MD;  Location: [facility] ORS;  Service: General;  Laterality: N/A;  . TOTAL ABDOMINAL HYSTERECTOMY W/ BILATERAL SALPINGOOPHORECTOMY N/A   . TUBAL LIGATION  2003   Social History   Socioeconomic History  . Marital status: Single    Spouse name: Not on file  . Number of children: Not on file  . Years of education: Not on file  . Highest education level: Not on file  Occupational History  . Occupation: Doesn't work outside the home  Social Needs  . Financial resource strain: Not on file  . Food insecurity:    Worry: Not on file    Inability: Not on file  . Transportation needs:    Medical: Not on file    Non-medical: Not on file  Tobacco Use  . Smoking status: Current Every Day Smoker    Packs/day: 1.00    Years: 26.00    Pack years: 26.00    Types: Cigarettes  . Smokeless tobacco: Former NeurosurgeonUser  . Tobacco comment: currently 1 pack or less  Substance and Sexual Activity  . Alcohol use: Yes    Alcohol/week: 0.0 standard drinks    Comment: occasional  . Drug  use: Yes    Types: Marijuana  . Sexual activity: Not Currently    Birth control/protection: Surgical  Lifestyle  . Physical activity:    Days per week: Not on file    Minutes per session: Not on file  . Stress: Not on file  Relationships  . Social connections:    Talks on phone: Not on file    Gets together: Not on file    Attends religious service: Not on file    Active member of club or organization: Not on file    Attends meetings of clubs or organizations: Not on file    Relationship status: Not on file  Other Topics Concern  . Not on file  Social History Narrative  . Not on file    Current Outpatient Medications  Medication Instructions  . ALPRAZolam (XANAX) 0.5 MG tablet TAKE 1 TABLET BY MOUTH AT BEDTIME AS NEEDED FOR SLEEP  . atorvastatin  (LIPITOR) 20 MG tablet TAKE 1 TABLET BY MOUTH ONCE DAILY  . cyclobenzaprine (FLEXERIL) 10 mg, Oral, 3 times daily PRN, for muscle spams  . HYDROcodone-acetaminophen (NORCO/VICODIN) 5-325 MG tablet 1 tablet, Oral, Every 6 hours PRN  . levothyroxine (EUTHYROX) 175 MCG tablet TAKE 1 TABLET BY MOUTH ONCE DAILY BEFORE BREAKFAST  . lisinopril (PRINIVIL,ZESTRIL) 10 MG tablet TAKE 1 TABLET BY MOUTH ONCE DAILY  . ondansetron (ZOFRAN-ODT) 4 MG disintegrating tablet DISSOLVE 1 TABLET IN MOUTH EVERY 8 HOURS AS NEEDED FOR NAUSEA AND VOMITING  . pantoprazole (PROTONIX) 40 MG tablet TAKE 1 TABLET BY MOUTH ONCE DAILY 30  MINUTES  PRIOR  TO  A  MEAL    Facility-Administered Encounter Medications as of 06/11/2018  Medication  . methylPREDNISolone acetate (DEPO-MEDROL) injection 40 mg   ALLERGIES: Allergies  Allergen Reactions  . Adhesive [Tape] Other (See Comments)    Skin irritation, patient prefers paper tape.   Budd Palmer. Celexa [Citalopram Hydrobromide] Other (See Comments)    Make patient too carefree   VACCINATION STATUS: Immunization History  Administered Date(s) Administered  . Influenza-Unspecified 12/11/2015, 12/13/2016  . Td 05/11/2006  . Tdap 12/13/2016    HPI  Teresa Vincent is a 38 year old female patient with medical history as above.  She is being managed in telehealth for follow-up of hypothyroidism related to remote past I-131 thyroid ablation.    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 She was treated with various doses of levothyroxine over the years currently on levothyroxine 175 mcg p.o. every morning.  She reports compliance and more consistent taking her medication.   She denies palpitations, tremors, heat/cold intolerance.   -She also has hyperlipidemia on atorvastatin , prediabetes with A1c of 5.8%, and hypertension on lisinopril.  -She denies any family history of thyroid dysfunction. She denies any  personal history of goiter nor family history of thyroid cancer.     Objective:     Recent Results (from the past 2160 hour(s))  TSH     Status: None   Collection Time: 10/11/18  9:34 AM  Result Value Ref Range   TSH 0.67 mIU/L    Comment:           Reference Range .           > or = 20 Years  0.40-4.50 .                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  She is advised to continue levothyroxine at 175 mcg p.o. daily before breakfast.     - We discussed about the correct intake of her thyroid hormone, on empty stomach at fasting, with water, separated by at least 30 minutes from breakfast and other medications,  and separated by more than 4 hours from calcium, iron, multivitamins, acid reflux medications (PPIs). -Patient is made aware of the fact that thyroid hormone replacement is needed for life, dose to be adjusted by periodic monitoring of thyroid function tests.  2. Hyperlipidemia-  -She has responded significantly for atorvastatin treatment, with improving LDL.  She is advised to continue atorvastatin 20 mg p.o. nightly.     3) prediabetes-A1c of 5.8% -She is not on medications.  She was previously counseled on diet and exercise.   - I advised patient to maintain close follow up with Mikey Kirschner, MD for primary care needs.  -She will return in 6 months with previsit labs.  Time for this visit: 15 minutes. Karely Hurtado Legacy  participated in the discussions, expressed understanding, and voiced agreement with the above plans.  All questions were answered to her satisfaction. she is encouraged to contact clinic should she  have any questions or concerns prior to her return visit.   Glade Lloyd, MD Phone: [phone]  Fax: [phone]  -  This note was partially dictated with voice recognition software. Similar sounding words can be transcribed inadequately or may not  be corrected upon review.  06/11/2018, 2:32 PM

## 2018-10-21 ENCOUNTER — Encounter: Payer: Self-pay | Admitting: Family Medicine

## 2018-10-21 ENCOUNTER — Telehealth: Payer: Self-pay | Admitting: Family Medicine

## 2018-10-21 DIAGNOSIS — I10 Essential (primary) hypertension: Secondary | ICD-10-CM

## 2018-10-21 NOTE — Telephone Encounter (Signed)
Pt is needing a referral to Dr. Denman George in [facility] cardiologist. She has an appt on 8/17

## 2018-10-21 NOTE — Telephone Encounter (Signed)
Referral ordered in Epic. 

## 2018-10-21 NOTE — Telephone Encounter (Signed)
ok 

## 2018-12-03 ENCOUNTER — Encounter: Payer: Self-pay | Admitting: Family Medicine

## 2018-12-03 ENCOUNTER — Other Ambulatory Visit: Payer: Self-pay

## 2018-12-03 ENCOUNTER — Ambulatory Visit (INDEPENDENT_AMBULATORY_CARE_PROVIDER_SITE_OTHER): Payer: 59 | Admitting: Family Medicine

## 2018-12-03 DIAGNOSIS — J329 Chronic sinusitis, unspecified: Secondary | ICD-10-CM | POA: Diagnosis not present

## 2018-12-03 DIAGNOSIS — K529 Noninfective gastroenteritis and colitis, unspecified: Secondary | ICD-10-CM | POA: Diagnosis not present

## 2018-12-03 MED ORDER — AMOXICILLIN 500 MG PO CAPS: ORAL_CAPSULE | ORAL | 0 refills | Status: DC

## 2018-12-03 MED ORDER — BENZONATATE 100 MG PO CAPS: ORAL_CAPSULE | ORAL | 0 refills | Status: DC

## 2018-12-03 MED ORDER — CHOLESTYRAMINE 4 GM/DOSE PO POWD: ORAL | 3 refills | Status: DC

## 2018-12-03 NOTE — Progress Notes (Signed)
   Subjective:  Audio only  Patient ID: Teresa Vincent, female    DOB: 09-18-1980, 38 y.o.   MRN: [id number]  Cough This is a new problem. Episode onset: 4-5 days ago  The cough is non-productive. Associated symptoms comments: Cough while laying down at night, fatigue, no normal BM in over a month, goes a couple times a day that is sometimes diarrhea and sometimes soft, stomach pain when having to use bathroom (started since having gallbladder taken out) . She has tried nothing for the symptoms.   Virtual Visit via Video Note  I connected with Teresa Vincent on 12/03/18 at 11:00 AM EDT by a video enabled telemedicine application and verified that I am speaking with the correct person using two identifiers.  Location: Patient: home Provider: office   I discussed the limitations of evaluation and management by telemedicine and the availability of in person appointments. The patient expressed understanding and agreed to proceed.  History of Present Illness:    Observations/Objective:   Assessment and Plan:   Follow Up Instructions:    I discussed the assessment and treatment plan with the patient. The patient was provided an opportunity to ask questions and all were answered. The patient agreed with the plan and demonstrated an understanding of the instructions.   The patient was advised to call back or seek an in-person evaluation if the symptoms worsen or if the condition fails to improve as anticipated. 19 minutes of non-face-to-face time during this encounter.   Vicente Males, LPN Patient has had protracted intermittent diarrhea ever since her cholecystectomy.  Would like to try some medication.  Originally was scheduled to do colonoscopy with GI but this was delayed after her blood in her stools went away and because of COVID-19.  Patient notes cough 4 to 5 days duration productive in nature.  No wheezing.  No fever no sore throat.  Patient does smoke  Review of  Systems  Respiratory: Positive for cough.        Objective:   Physical Exam   Virtual     Assessment & Plan:  Impression persistent diarrhea postcholecystectomy options discussed will give trial of Questran rationale discussed  2.  Acute bronchitis will cover with antibiotics with patient's potential exposures will do COVID-19 test

## 2018-12-04 ENCOUNTER — Other Ambulatory Visit: Payer: Self-pay

## 2018-12-04 ENCOUNTER — Telehealth: Payer: Self-pay | Admitting: Family Medicine

## 2018-12-04 DIAGNOSIS — U071 COVID-19: Secondary | ICD-10-CM

## 2018-12-04 NOTE — Telephone Encounter (Signed)
Patient has Covid test today and needing work note to when she can return. Please Advise

## 2018-12-04 NOTE — Telephone Encounter (Signed)
Um need results in order to know duration out, rec out thru Sunday pending

## 2018-12-05 LAB — NOVEL CORONAVIRUS, NAA: SARS-CoV-2, NAA: NOT DETECTED

## 2018-12-06 NOTE — Telephone Encounter (Signed)
Sure note thru sun

## 2018-12-06 NOTE — Telephone Encounter (Signed)
Patient's test was negative, still write note out till Sunday?  I can advise patient note written but if further symptoms she will need to contact us back to extend the note?

## 2018-12-09 ENCOUNTER — Encounter: Payer: Self-pay | Admitting: Family Medicine

## 2018-12-09 NOTE — Telephone Encounter (Signed)
Pt called to check status of work note, apologized that we did not get to this Friday  Note printed for pickup

## 2018-12-10 ENCOUNTER — Other Ambulatory Visit: Payer: Self-pay | Admitting: Family Medicine

## 2018-12-11 NOTE — Telephone Encounter (Signed)
Ok plus 5 monthly ref 

## 2019-01-07 ENCOUNTER — Other Ambulatory Visit: Payer: Self-pay | Admitting: Family Medicine

## 2019-01-07 ENCOUNTER — Other Ambulatory Visit: Payer: Self-pay | Admitting: "Endocrinology

## 2019-01-07 NOTE — Telephone Encounter (Signed)
Ok plus 5 monthly ref 

## 2019-01-08 ENCOUNTER — Ambulatory Visit (INDEPENDENT_AMBULATORY_CARE_PROVIDER_SITE_OTHER): Payer: 59 | Admitting: Family Medicine

## 2019-01-08 DIAGNOSIS — Z20828 Contact with and (suspected) exposure to other viral communicable diseases: Secondary | ICD-10-CM

## 2019-01-08 DIAGNOSIS — Z20822 Contact with and (suspected) exposure to covid-19: Secondary | ICD-10-CM

## 2019-01-08 NOTE — Progress Notes (Signed)
   Subjective:  Audio plus video  Patient ID: Teresa Vincent, female    DOB: 1980-08-19, 38 y.o.   MRN: [id number]  Cough This is a new problem. The current episode started in the past 7 days. Associated symptoms include ear pain, headaches and nasal congestion. Associated symptoms comments: Lost sense of taste and smell.   Patient had covid test in [place] Monday awaiting results   Review of Systems  HENT: Positive for ear pain.   Respiratory: Positive for cough.   Neurological: Positive for headaches.   Virtual Visit via Video Note  I connected with Teresa Vincent on 01/08/19 at  1:10 PM EDT by a video enabled telemedicine application and verified that I am speaking with the correct person using two identifiers.  Location: Patient: home Provider: office   I discussed the limitations of evaluation and management by telemedicine and the availability of in person appointments. The patient expressed understanding and agreed to proceed.  History of Present Illness:    Observations/Objective:   Assessment and Plan:   Follow Up Instructions:    I discussed the assessment and treatment plan with the patient. The patient was provided an opportunity to ask questions and all were answered. The patient agreed with the plan and demonstrated an understanding of the instructions.   The patient was advised to call back or seek an in-person evaluation if the symptoms worsen or if the condition fails to improve as anticipated.  I provided 18 minutes of non-face-to-face time during this encounter.  Patient notes congestion.  Headache.  Stuffiness.  Most concerning she had sudden loss of taste and smell.  She has not been around anybody with known COVID-19.  Energy level diminished.  Achiness overall also   No rash no vomiting no diarrhea    Objective:   Physical Exam   Virtual     Assessment & Plan:  Impression suspected COVID-19 infection.  Cannot even smell of bleach or  garlic per patient rationale discussed.  Symptom care discussed.  Warning signs discussed.  Await COVID-19 testing results.  Have advised the patient was sudden loss of taste and smell even if test returns negative we will presume that she truly has it.

## 2019-01-12 ENCOUNTER — Encounter: Payer: Self-pay | Admitting: Family Medicine

## 2019-01-14 ENCOUNTER — Other Ambulatory Visit: Payer: Self-pay | Admitting: *Deleted

## 2019-01-14 DIAGNOSIS — Z20822 Contact with and (suspected) exposure to covid-19: Secondary | ICD-10-CM

## 2019-01-15 LAB — NOVEL CORONAVIRUS, NAA: SARS-CoV-2, NAA: DETECTED — AB

## 2019-01-17 ENCOUNTER — Telehealth: Payer: Self-pay | Admitting: Family Medicine

## 2019-01-17 NOTE — Telephone Encounter (Signed)
Patient stated she is set to retest on next Tuesday and needs a negative test to return to work  Patient needs copy of previous positive test mailed to her so she can send to work.

## 2019-01-17 NOTE — Telephone Encounter (Signed)
Patient's first covid test was 10/21 and was positive she was told she had to have a repeat test as negative. She just had a repeat test that also came back positive and wants to know when she can return to work and how long to quarantine.

## 2019-01-17 NOTE — Telephone Encounter (Signed)
Her workplace is demanding something that the cdc no longer recommends, since she is now on that pathway, rec repeat test early next wk and w e thru next wk

## 2019-01-17 NOTE — Telephone Encounter (Signed)
Patient got the results of her 2nd Covid test which was positive also. (The first one was in [place]).  She wants to know how long she should quarantine for and when she can go back to work.

## 2019-01-21 ENCOUNTER — Telehealth: Payer: Self-pay | Admitting: Family Medicine

## 2019-01-21 NOTE — Telephone Encounter (Signed)
Pt calling, states [facility] was supposed to call us to request a Rx for pt to get another Covid test  Please advise & call pt

## 2019-01-21 NOTE — Telephone Encounter (Signed)
Patient with previous positive covid screenings 01/07/19 and 01/14/19

## 2019-01-22 ENCOUNTER — Other Ambulatory Visit: Payer: Self-pay

## 2019-01-22 DIAGNOSIS — Z20822 Contact with and (suspected) exposure to covid-19: Secondary | ICD-10-CM

## 2019-01-22 NOTE — Telephone Encounter (Signed)
Pt tested positive on 01/07/2019 at [place] and then again on 01/14/2019 at [facility] Pt states Provider told her to test 7-10 days after she tested positive. Pt was told that she tested to early. Pt was told she was to wait 14 days after being tested positive to test again. Protocol has changed; they are only able to test every 90 days unless provider prescribes. After one test, you may have to have to see doctor to get script to test again.  Pt states she was able to get tested this morning at [facility].

## 2019-01-22 NOTE — Telephone Encounter (Signed)
Huh? We dont order this directly to [facility] talk to pt

## 2019-01-23 LAB — NOVEL CORONAVIRUS, NAA: SARS-CoV-2, NAA: NOT DETECTED

## 2019-02-11 ENCOUNTER — Other Ambulatory Visit: Payer: Self-pay | Admitting: "Endocrinology

## 2019-02-15 ENCOUNTER — Other Ambulatory Visit: Payer: Self-pay | Admitting: "Endocrinology

## 2019-02-15 ENCOUNTER — Other Ambulatory Visit: Payer: Self-pay | Admitting: Family Medicine

## 2019-03-18 ENCOUNTER — Ambulatory Visit (INDEPENDENT_AMBULATORY_CARE_PROVIDER_SITE_OTHER): Payer: Self-pay | Admitting: Family Medicine

## 2019-03-18 ENCOUNTER — Other Ambulatory Visit: Payer: Self-pay

## 2019-03-18 ENCOUNTER — Ambulatory Visit (HOSPITAL_COMMUNITY)
Admission: RE | Admit: 2019-03-18 | Discharge: 2019-03-18 | Disposition: A | Discharge status: Home / Self Care | Payer: Self-pay | Source: Ambulatory Visit | Attending: Family Medicine | Admitting: Family Medicine

## 2019-03-18 ENCOUNTER — Other Ambulatory Visit (HOSPITAL_COMMUNITY)
Admission: RE | Admit: 2019-03-18 | Discharge: 2019-03-18 | Disposition: A | Discharge status: Home / Self Care | Payer: Self-pay | Source: Ambulatory Visit | Attending: Family Medicine | Admitting: Family Medicine

## 2019-03-18 DIAGNOSIS — G8929 Other chronic pain: Secondary | ICD-10-CM

## 2019-03-18 DIAGNOSIS — M79605 Pain in left leg: Secondary | ICD-10-CM

## 2019-03-18 LAB — D-DIMER, QUANTITATIVE: D-Dimer, Quant: 0.45 ug/mL-FEU (ref 0.00–0.50)

## 2019-03-18 NOTE — Progress Notes (Signed)
   Subjective:  Audio plus video  Patient ID: Teresa Vincent, female    DOB: [birth year], 38 y.o.   MRN: [id number]  Leg Pain  Incident onset: pt had back surgery Mar 06 2018. The pain is present in the left leg. The pain is at a severity of 10/10. Associated symptoms include a loss of sensation and numbness. Treatments tried: Gabapentin 600 mg TID (no help); Oxycodone 5 mg (1/2 tablet) mild relief  (pt is still taking this) TENS unit for back ( no help)  pt states her leg swells also and causes it to be hard to walk/work. Pt states that husband will massage leg and lower back; but the pain is terrible.   Virtual Visit via Video Note  I connected with Teresa Vincent on 03/18/19 at 10:00 AM EST by a video enabled telemedicine application and verified that I am speaking with the correct person using two identifiers.  Location: Patient: home Provider: office   I discussed the limitations of evaluation and management by telemedicine and the availability of in person appointments. The patient expressed understanding and agreed to proceed.  History of Present Illness:    Observations/Objective:   Assessment and Plan:   Follow Up Instructions:    I discussed the assessment and treatment plan with the patient. The patient was provided an opportunity to ask questions and all were answered. The patient agreed with the plan and demonstrated an understanding of the instructions.   The patient was advised to call back or seek an in-person evaluation if the symptoms worsen or if the condition fails to improve as anticipated.  I provided 25 minutes of non-face-to-face time during this encounter.   Vicente Males, LPN     Review of Systems  Neurological: Positive for numbness.       Objective:   Physical Exam  Virtual      Assessment & Plan:  Impression 2 separate issues persistent severe neuropathic pain.  Unresponsive to current medications.  Patient has had 2 back  surgeries.  Needs pain management referral.  2.  Pain worse last couple days accompanied by swelling.  No fever.  Recalls no injury.  Discussion held we will press on and do ultrasound plus D-dimer addendum results returned negative

## 2019-03-26 ENCOUNTER — Encounter: Payer: Self-pay | Admitting: Family Medicine

## 2019-03-28 ENCOUNTER — Encounter (HOSPITAL_COMMUNITY): Payer: Self-pay | Admitting: Emergency Medicine

## 2019-03-28 ENCOUNTER — Emergency Department (HOSPITAL_COMMUNITY)
Admission: EM | Admit: 2019-03-28 | Discharge: 2019-03-28 | Disposition: A | Discharge status: Home / Self Care | Payer: Self-pay | Attending: Emergency Medicine | Admitting: Emergency Medicine

## 2019-03-28 ENCOUNTER — Other Ambulatory Visit: Payer: Self-pay

## 2019-03-28 ENCOUNTER — Emergency Department (HOSPITAL_COMMUNITY): Payer: Self-pay

## 2019-03-28 DIAGNOSIS — N201 Calculus of ureter: Secondary | ICD-10-CM | POA: Insufficient documentation

## 2019-03-28 DIAGNOSIS — Z79899 Other long term (current) drug therapy: Secondary | ICD-10-CM | POA: Insufficient documentation

## 2019-03-28 DIAGNOSIS — J45909 Unspecified asthma, uncomplicated: Secondary | ICD-10-CM | POA: Insufficient documentation

## 2019-03-28 DIAGNOSIS — E039 Hypothyroidism, unspecified: Secondary | ICD-10-CM | POA: Insufficient documentation

## 2019-03-28 DIAGNOSIS — I10 Essential (primary) hypertension: Secondary | ICD-10-CM | POA: Insufficient documentation

## 2019-03-28 DIAGNOSIS — F17210 Nicotine dependence, cigarettes, uncomplicated: Secondary | ICD-10-CM | POA: Insufficient documentation

## 2019-03-28 LAB — COMPREHENSIVE METABOLIC PANEL
ALT: 16 U/L (ref 0–44)
AST: 17 U/L (ref 15–41)
Albumin: 4.1 g/dL (ref 3.5–5.0)
Alkaline Phosphatase: 58 U/L (ref 38–126)
Anion gap: 7 (ref 5–15)
BUN: 12 mg/dL (ref 6–20)
CO2: 23 mmol/L (ref 22–32)
Calcium: 8.9 mg/dL (ref 8.9–10.3)
Chloride: 107 mmol/L (ref 98–111)
Creatinine, Ser: 0.73 mg/dL (ref 0.44–1.00)
GFR calc Af Amer: >60 mL/min (ref >60)
GFR calc non Af Amer: >60 mL/min (ref >60)
Glucose, Bld: 101 mg/dL — ABNORMAL HIGH (ref 70–99)
Potassium: 3.9 mmol/L (ref 3.5–5.1)
Sodium: 137 mmol/L (ref 135–145)
Total Bilirubin: 0.7 mg/dL (ref 0.3–1.2)
Total Protein: 7.1 g/dL (ref 6.5–8.1)

## 2019-03-28 LAB — CBC WITH DIFFERENTIAL/PLATELET
Abs Immature Granulocytes: 0.03 10*3/uL (ref 0.00–0.07)
Basophils Absolute: 0 10*3/uL (ref 0.0–0.1)
Basophils Relative: 1 %
Eosinophils Absolute: 0.2 10*3/uL (ref 0.0–0.5)
Eosinophils Relative: 2 %
HCT: 44.1 % (ref 36.0–46.0)
Hemoglobin: 14.4 g/dL (ref 12.0–15.0)
Immature Granulocytes: 0 %
Lymphocytes Relative: 29 %
Lymphs Abs: 2.1 10*3/uL (ref 0.7–4.0)
MCH: 30.7 pg (ref 26.0–34.0)
MCHC: 32.7 g/dL (ref 30.0–36.0)
MCV: 94 fL (ref 80.0–100.0)
Monocytes Absolute: 0.5 10*3/uL (ref 0.1–1.0)
Monocytes Relative: 7 %
Neutro Abs: 4.5 10*3/uL (ref 1.7–7.7)
Neutrophils Relative %: 61 %
Platelets: 276 10*3/uL (ref 150–400)
RBC: 4.69 MIL/uL (ref 3.87–5.11)
RDW: 12.9 % (ref 11.5–15.5)
WBC: 7.3 10*3/uL (ref 4.0–10.5)
nRBC: 0 % (ref 0.0–0.2)

## 2019-03-28 LAB — URINALYSIS, ROUTINE W REFLEX MICROSCOPIC
Bilirubin Urine: NEGATIVE
Glucose, UA: NEGATIVE mg/dL
Ketones, ur: NEGATIVE mg/dL
Leukocytes,Ua: NEGATIVE
Nitrite: NEGATIVE
Protein, ur: 30 mg/dL — AB
RBC / HPF: >50 RBC/hpf — ABNORMAL HIGH (ref 0–5)
Specific Gravity, Urine: 1.02 (ref 1.005–1.030)
pH: 7 (ref 5.0–8.0)

## 2019-03-28 LAB — LIPASE, BLOOD: Lipase: 26 U/L (ref 11–51)

## 2019-03-28 MED ORDER — HYDROMORPHONE HCL 1 MG/ML IJ SOLN
1.0000 mg | Freq: Once | INTRAMUSCULAR | Status: AC
  Administered 2019-03-28: 1 mg via INTRAVENOUS
  Filled 2019-03-28: qty 1

## 2019-03-28 MED ORDER — ONDANSETRON 4 MG PO TBDP: 4.0000 mg | ORAL_TABLET | Freq: Three times a day (TID) | ORAL | 0 refills | Status: DC | PRN

## 2019-03-28 MED ORDER — TAMSULOSIN HCL 0.4 MG PO CAPS: 0.4000 mg | ORAL_CAPSULE | Freq: Every day | ORAL | 0 refills | Status: AC

## 2019-03-28 MED ORDER — ONDANSETRON HCL 4 MG/2ML IJ SOLN
4.0000 mg | Freq: Once | INTRAMUSCULAR | Status: AC
  Administered 2019-03-28: 12:00:00 4 mg via INTRAVENOUS
  Filled 2019-03-28: qty 2

## 2019-03-28 MED ORDER — OXYCODONE-ACETAMINOPHEN 5-325 MG PO TABS: 1.0000 | ORAL_TABLET | ORAL | 0 refills | Status: DC | PRN

## 2019-03-28 MED ORDER — TAMSULOSIN HCL 0.4 MG PO CAPS
0.4000 mg | ORAL_CAPSULE | Freq: Once | ORAL | Status: AC
  Administered 2019-03-28: 0.4 mg via ORAL
  Filled 2019-03-28: qty 1

## 2019-03-28 NOTE — ED Triage Notes (Signed)
Patient reporting R sided abdominal pain with radiation into her R flank, onset yesterday. Patient reports hematuria this am. Has hx of kidney stones.

## 2019-03-28 NOTE — ED Provider Notes (Signed)
[facility] Provider Note   CSN: [id number] Arrival date & time: 03/28/19  1050     History Chief Complaint  Patient presents with  . Abdominal Pain    Teresa Vincent is a 39 y.o. female with a history as outlined below, also with history of kidney stones, never requiring intervention presenting with a sudden onset of pain in her right flank which radiates into her right lower abdomen that started yesterday morning.  Her symptoms have been waxing and waning, she reports vomiting secondary to pain but denies any significant nausea.  She has had no fevers or chills.  She has had significant hematuria, denies dysuria or increased urinary frequency.  She has had no medications for her symptoms prior to arrival.  The history is provided by the patient.       Past Medical History:  Diagnosis Date  . Abnormal Pap smear of cervix   . ANXIETY 08/30/2007  . Asthma   . Chronic back pain   . GERD (gastroesophageal reflux disease)   . History of kidney stones   . Hypertension   . 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 INSOMNIA 08/30/2007  . MIGRAINE HEADACHE 08/30/2007  . Neck pain   . Palpitations    occasionally couple times a week  . Sciatica   . Sleep apnea    cpap  . Stroke [facility])     Patient Active Problem List   Diagnosis Date Noted  . S/P lumbar fusion 02/28/2018  . Prediabetes 12/04/2017  . Current smoker 12/04/2017  . S/P lumbar spinal fusion 05/29/2017  . Rectal bleeding 02/01/2017  . Post-operative pain 12/30/2016  . Calculus of gallbladder without cholecystitis without obstruction   . Mixed hyperlipidemia 10/11/2016  . 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/10/2015  . Class 2 severe obesity due to excess calories with serious comorbidity and body mass index (BMI) of 36.0 to 36.9 in adult [facility]) 06/10/2015  . Left-sided low back pain with left-sided sciatica 05/03/2015  . Essential hypertension, benign  12/30/2012  . Esophageal reflux 12/30/2012  . 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 ANXIETY 08/30/2007  . MIGRAINE HEADACHE 08/30/2007  . Insomnia 08/30/2007    Past Surgical History:  Procedure Laterality Date  . ABDOMINAL EXPOSURE N/A 05/29/2017   Procedure: ABDOMINAL EXPOSURE;  Surgeon: Angelia Mould, MD;  [facility]: [facility];  Service: Vascular;  Laterality: N/A;  . ABDOMINAL HYSTERECTOMY    . ANTERIOR LUMBAR FUSION N/A 05/29/2017   Procedure: Anterior Lumbar Interbody Fusion  - Lumbar five sacral one;  Surgeon: Eustace Moore, MD;  Location: [facility];  Service: Neurosurgery;  Laterality: N/A;  . CARPAL TUNNEL RELEASE Bilateral   . CHOLECYSTECTOMY    . CHOLECYSTECTOMY N/A 12/29/2016   Procedure: LAPAROSCOPIC CHOLECYSTECTOMY;  Surgeon: Virl Cagey, MD;  Location: [facility];  Service: General;  Laterality: N/A;  . LAMINECTOMY WITH POSTERIOR LATERAL ARTHRODESIS LEVEL 1 N/A 02/28/2018   Procedure: Laminectomy and Foraminotomy Lumbar Five-Sacral One, posterolateral fusion and fixation;  Surgeon: Eustace Moore, MD;  Location: [facility];  Service: Neurosurgery;  Laterality: N/A;  posterior  . LUMBAR EPIDURAL INJECTION    . STERIOD INJECTION N/A 01/10/2017   Procedure: MINOR EXPAREL INJECTION;  Surgeon: Virl Cagey, MD;  Location: [facility];  Service: General;  Laterality: N/A;  . TOTAL ABDOMINAL HYSTERECTOMY W/ BILATERAL SALPINGOOPHORECTOMY N/A   . TUBAL LIGATION  2003     OB History    Gravida  4  Para  1   Term  1   Preterm      AB  3   Living  1     SAB      TAB      Ectopic      Multiple      Live Births              Family History  Problem Relation Age of Onset  . Hypertension Mother   . Thyroid disease Neg Hx     Social History   Tobacco Use  . Smoking status: Current Every Day Smoker    Packs/day: 1.00    Years: 26.00    Pack years: 26.00    Types: Cigarettes  . Smokeless tobacco: Former Neurosurgeon  . Tobacco comment:  currently 1 pack or less  Substance Use Topics  . Alcohol use: Yes    Alcohol/week: 0.0 standard drinks    Comment: occasional  . Drug use: Yes    Types: Marijuana    Home Medications Prior to Admission medications   Medication Sig Start Date End Date Taking? Authorizing Provider  ALPRAZolam Prudy Feeler) 0.5 MG tablet TAKE 1 TABLET BY MOUTH AT BEDTIME AS NEEDED FOR SLEEP 01/07/19   [name], MD  amoxicillin (AMOXIL) 500 MG capsule Take one capsule po TID for 10 days Patient not taking: Reported on 03/18/2019 12/03/18   [name], MD  atorvastatin (LIPITOR) 20 MG tablet Take 1 tablet by mouth once daily 02/11/19   Roma Kayser, MD  benzonatate (TESSALON) 100 MG capsule Take one capsule po every 8 hrs prn cough 12/03/18   [name], MD  cholestyramine Lanetta Inch) 4 GM/DOSE powder One scoop BID prn diarrhea 12/03/18   [name], MD  cyclobenzaprine (FLEXERIL) 10 MG tablet Take 1 tablet (10 mg total) by mouth 3 (three) times daily as needed. for muscle spams 02/28/18   Tia Alert, MD  EUTHYROX 175 MCG tablet TAKE 1 TABLET BY MOUTH ONCE DAILY BEFORE BREAKFAST 02/17/19   Roma Kayser, MD  HYDROcodone-acetaminophen (NORCO/VICODIN) 5-325 MG tablet Take 1 tablet by mouth every 6 (six) hours as needed for moderate pain.    [provider]  lisinopril (ZESTRIL) 10 MG tablet Take 1 tablet (10 mg total) by mouth daily. 01/07/19   Roma Kayser, MD  ondansetron (ZOFRAN ODT) 4 MG disintegrating tablet Take 1 tablet (4 mg total) by mouth every 8 (eight) hours as needed for nausea or vomiting. 03/28/19   Burgess Amor, PA-C  oxyCODONE-acetaminophen (PERCOCET/ROXICET) 5-325 MG tablet Take 1 tablet by mouth every 4 (four) hours as needed. 03/28/19   Burgess Amor, PA-C  pantoprazole (PROTONIX) 40 MG tablet TAKE 1 TABLET BY MOUTH ONCE DAILY 30  MINUTES  PRIOR  TO  A  MEAL 09/27/18   Tiffany Kocher, PA-C  tamsulosin (FLOMAX) 0.4 MG CAPS capsule Take 1  capsule (0.4 mg total) by mouth daily after supper for 10 days. 03/28/19 04/07/19  Burgess Amor, PA-C    Allergies    Adhesive [tape] and Celexa [citalopram hydrobromide]  Review of Systems   Review of Systems  Constitutional: Negative for chills and fever.  HENT: Negative for congestion and sore throat.   Eyes: Negative.   Respiratory: Negative for chest tightness and shortness of breath.   Cardiovascular: Negative for chest pain.  Gastrointestinal: Positive for abdominal pain and vomiting. Negative for nausea.  Genitourinary: Positive for flank pain and hematuria. Negative for dysuria, frequency, pelvic pain  and urgency.  Musculoskeletal: Negative for arthralgias, joint swelling and neck pain.  Skin: Negative.  Negative for rash and wound.  Neurological: Negative for dizziness, weakness, light-headedness, numbness and headaches.  Psychiatric/Behavioral: Negative.     Physical Exam Updated Vital Signs BP 116/69 (BP Location: Left Arm)   Pulse (!) 56   Temp 98.2 F (36.8 C) (Oral)   Resp 16   Ht 5\' 7"  (1.702 m)   Wt 99.8 kg   LMP 02/25/2016   SpO2 98%   BMI 34.46 kg/m   Physical Exam Vitals and nursing note reviewed.  Constitutional:      Appearance: She is well-developed.  HENT:     Head: Normocephalic and atraumatic.  Eyes:     Conjunctiva/sclera: Conjunctivae normal.  Cardiovascular:     Rate and Rhythm: Normal rate and regular rhythm.     Heart sounds: Normal heart sounds.  Pulmonary:     Effort: Pulmonary effort is normal.     Breath sounds: Normal breath sounds. No wheezing.  Abdominal:     General: Bowel sounds are normal.     Palpations: Abdomen is soft.     Tenderness: There is abdominal tenderness in the right lower quadrant. There is right CVA tenderness. There is no guarding or rebound.  Musculoskeletal:        General: Normal range of motion.     Cervical back: Normal range of motion.  Skin:    General: Skin is warm and dry.  Neurological:      Mental Status: She is alert.     ED Results / Procedures / Treatments   Labs (all labs ordered are listed, but only abnormal results are displayed) Labs Reviewed  URINALYSIS, ROUTINE W REFLEX MICROSCOPIC - Abnormal; Notable for the following components:      Result Value   APPearance CLOUDY (*)    Hgb urine dipstick LARGE (*)    Protein, ur 30 (*)    RBC / HPF >50 (*)    Bacteria, UA RARE (*)    All other components within normal limits  COMPREHENSIVE METABOLIC PANEL - Abnormal; Notable for the following components:   Glucose, Bld 101 (*)    All other components within normal limits  CBC WITH DIFFERENTIAL/PLATELET  LIPASE, BLOOD    EKG None  Radiology CT Renal Stone Study  Result Date: 03/28/2019 CLINICAL DATA:  Right-sided flank pain EXAM: CT ABDOMEN AND PELVIS WITHOUT CONTRAST TECHNIQUE: Multidetector CT imaging of the abdomen and pelvis was performed following the standard protocol without oral or IV contrast. COMPARISON:  January 02, 2017 FINDINGS: Lower chest: There is mild bibasilar atelectatic change. Hepatobiliary: There is a 5 mm probable cyst in the right lobe of the liver adjacent to the fissure for the ligamentum teres. No other liver lesion evident on this noncontrast enhanced study. Gallbladder is absent. There is no appreciable biliary duct dilatation. Pancreas: There is no apparent pancreatic mass or inflammatory focus. Spleen: No splenic lesions are evident. Adrenals/Urinary Tract: Adrenals bilaterally appear normal. There is a cyst arising from the lateral mid right kidney measuring 2.3 x 2.3 cm. There is mild hydronephrosis on the right. There is no hydronephrosis on the left. There is a calculus in the lower pole of the right kidney measuring 6 x 6 mm. There is a calculus measuring 7 x 6 mm in the proximal right ureter just beyond the ureteropelvic junction. No other ureteral calculi evident. Urinary bladder is midline with wall thickness within normal limits.  Stomach/Bowel: There  is no appreciable bowel wall or mesenteric thickening. Terminal ileum appears normal. There is mild lipomatous infiltration of the ileocecal valve. There is no evident bowel obstruction. No free air or portal venous air. Vascular/Lymphatic: No abdominal aortic aneurysm. There is mild calcification in the distal aorta. No adenopathy is evident in the abdomen or pelvis. Reproductive: Uterus is absent.  There is no evident pelvic mass. Other: The appendix appears normal. No evident abscess or ascites in the abdomen or pelvis. There is a small amount of fat in the umbilicus. Musculoskeletal: Postoperative changes noted at L5 and S1. 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 There is surgical fixation through this area. No blastic or lytic bone lesions evident. No intramuscular lesions are apparent. IMPRESSION: 1. 7 x 6 mm calculus in the proximal right ureter slightly beyond the right ureteropelvic junction. Mild hydronephrosis on the right. 2. 6 x 6 mm nonobstructing calculus in the lower pole of the right kidney. 3. No bowel obstruction. No abscess in the abdomen or pelvis. Appendix appears normal. 4.  Gallbladder and uterus absent. 5. Postoperative change at L5 and S1. Surgical fixation through an area of spondylolisthesis at L5-S1 with apparent pars defects at L5 bilaterally. Electronically Signed   By: Bretta Bang III M.D.   On: 03/28/2019 13:14    Procedures Procedures (including critical care time)  Medications Ordered in ED Medications  ondansetron (ZOFRAN) injection 4 mg (4 mg Intravenous Given 03/28/19 1217)  HYDROmorphone (DILAUDID) injection 1 mg (1 mg Intravenous Given 03/28/19 1217)  HYDROmorphone (DILAUDID) injection 1 mg (1 mg Intravenous Given 03/28/19 1409)  tamsulosin (FLOMAX) capsule 0.4 mg (0.4 mg Oral Given 03/28/19 1409)    ED Course  I have reviewed the triage vital signs and the nursing notes.  Pertinent labs & imaging results  that were available during my care of the patient were reviewed by me and considered in my medical decision making (see chart for details).    MDM Rules/Calculators/A&P                      Imaging and labs reviewed, interpreted and discussed with patient.  She has a large amount of hemoglobin in her urine but there is no sign of a UTI.  CT is significant for a 7 x 6 mm UPJ stone.  Mild hydronephrosis.  Renal function is normal.  She was given Dilaudid and Zofran here with improved symptoms.  She was also started on Flomax prior to discharge home, oxycodone and Zofran prescribed for home use.  She was given strict return precautions including fever, uncontrolled pain or vomiting.  She was also referred to [facility] urology for close follow-up.  She knows to call for an appointment today.  She was given a urine strainer. Final Clinical Impression(s) / ED Diagnoses Final diagnoses:  Right ureteral stone    Rx / DC Orders ED Discharge Orders         Ordered    oxyCODONE-acetaminophen (PERCOCET/ROXICET) 5-325 MG tablet  Every 4 hours PRN     03/28/19 1351    ondansetron (ZOFRAN ODT) 4 MG disintegrating tablet  Every 8 hours PRN     03/28/19 1351    tamsulosin (FLOMAX) 0.4 MG CAPS capsule  Daily after supper     03/28/19 1351           Burgess Amor, PA-C 03/28/19 1439    Cathren Laine, MD 03/30/19 1610

## 2019-03-28 NOTE — Discharge Instructions (Addendum)
Use the medicines prescribed for pain and nausea.  Also, the flomax (take at night) may help relax the ureter allowing the stone to pass more easily.  Strain your urine so you will know if the stone passes.  Call [facility] for a followup appointment as soon as possible.  In the interim, return here for any fevers, uncontrolled vomiting or pain.   You may take the oxycodone prescribed for pain relief.  This will make you drowsy - do not drive within 4 hours of taking this medication.

## 2019-03-31 ENCOUNTER — Telehealth: Payer: Self-pay | Admitting: Urology

## 2019-03-31 NOTE — Telephone Encounter (Signed)
Patient called and asked how long before her stone would pass. I was unable to give patient a specific date. Did encourage pt to keep scheduled appointment, continue flomax and straining urine and to call back if symptoms worsen or have a fever.

## 2019-03-31 NOTE — Telephone Encounter (Signed)
Pt is scheduled for 04/09/19 for a hospital follow up. She requested a member of the clinical staff contact her to answer a few of her questions.

## 2019-04-09 ENCOUNTER — Other Ambulatory Visit: Payer: Self-pay

## 2019-04-09 ENCOUNTER — Other Ambulatory Visit: Payer: Self-pay | Admitting: Urology

## 2019-04-09 ENCOUNTER — Ambulatory Visit (HOSPITAL_COMMUNITY)
Admission: RE | Admit: 2019-04-09 | Discharge: 2019-04-09 | Disposition: A | Discharge status: Home / Self Care | Payer: Self-pay | Source: Ambulatory Visit | Attending: Urology | Admitting: Urology

## 2019-04-09 ENCOUNTER — Other Ambulatory Visit (HOSPITAL_COMMUNITY): Payer: Medicaid Other

## 2019-04-09 ENCOUNTER — Ambulatory Visit (INDEPENDENT_AMBULATORY_CARE_PROVIDER_SITE_OTHER): Payer: Medicaid Other | Admitting: Urology

## 2019-04-09 ENCOUNTER — Other Ambulatory Visit (HOSPITAL_COMMUNITY)
Admission: RE | Admit: 2019-04-09 | Discharge: 2019-04-09 | Disposition: A | Discharge status: Home / Self Care | Payer: Medicaid Other | Source: Ambulatory Visit | Attending: Urology | Admitting: Urology

## 2019-04-09 ENCOUNTER — Encounter: Payer: Self-pay | Admitting: Urology

## 2019-04-09 VITALS — BP 115/75 | HR 77 | Temp 95.5°F | Ht 67.0 in | Wt 230.0 lb

## 2019-04-09 DIAGNOSIS — N200 Calculus of kidney: Secondary | ICD-10-CM

## 2019-04-09 DIAGNOSIS — Z20822 Contact with and (suspected) exposure to covid-19: Secondary | ICD-10-CM | POA: Insufficient documentation

## 2019-04-09 DIAGNOSIS — Z01812 Encounter for preprocedural laboratory examination: Secondary | ICD-10-CM | POA: Insufficient documentation

## 2019-04-09 LAB — POCT URINALYSIS DIPSTICK
Glucose, UA: NEGATIVE
Ketones, UA: NEGATIVE
Leukocytes, UA: NEGATIVE
Nitrite, UA: NEGATIVE
Protein, UA: NEGATIVE
Spec Grav, UA: >=1.03 — AB (ref 1.010–1.025)
Urobilinogen, UA: NEGATIVE E.U./dL — AB
pH, UA: 5 (ref 5.0–8.0)

## 2019-04-09 LAB — SARS CORONAVIRUS 2 (TAT 6-24 HRS): SARS Coronavirus 2: NEGATIVE

## 2019-04-09 MED ORDER — TAMSULOSIN HCL 0.4 MG PO CAPS: 0.4000 mg | ORAL_CAPSULE | Freq: Every day | ORAL | 0 refills | Status: DC

## 2019-04-09 MED ORDER — OXYCODONE-ACETAMINOPHEN 5-325 MG PO TABS: 1.0000 | ORAL_TABLET | ORAL | 0 refills | Status: DC | PRN

## 2019-04-09 NOTE — Progress Notes (Signed)
Urological Symptom Review  Patient is experiencing the following symptoms: Frequent urination Leakage of urine Blood in urine   Review of Systems  Gastrointestinal (upper)  : Nausea Vomiting Indigestion/heartburn  Gastrointestinal (lower) : Diarrhea  Constitutional : Weight loss Fatigue  Skin: Negative for skin symptoms  Eyes: Negative for eye symptoms  Ear/Nose/Throat : Negative for Ear/Nose/Throat symptoms  Hematologic/Lymphatic: Negative for Hematologic/Lymphatic symptoms  Cardiovascular : Negative for cardiovascular symptoms  Respiratory : Negative for respiratory symptoms  Endocrine: Excessive thirst Negative for endocrine symptoms  Musculoskeletal: Back pain  Neurological: Negative for neurological symptoms  Psychologic: Anxiety

## 2019-04-09 NOTE — Progress Notes (Signed)
04/09/2019 8:58 AM   Teresa Vincent [birth year] [id number]  Referring provider: Mikey Kirschner, MD [address] [place],  [place] [address] Right flank pain  HPI: Teresa Vincent is a 39yo here for evaluation of nephrolithiasis. She developed sharp, severe, nonraditing right flank pain on 1/7. She presented to the ER. She underwent CT on 03/28/2019 which showed a right 39m UPJ calculus. I reviewed the images and discussed with the patient. She has associated nausea and occasional vomiting. No LUTS. No fevers. No other exacerbating/alleviating. She did have gross hematuria 1 week ago which has since resolved. She had a stone event 2 years ago and passed the calculi.    PMH: Past Medical History:  Diagnosis Date  . Abnormal Pap smear of cervix   . ANXIETY 08/30/2007  . Asthma   . Chronic back pain   . GERD (gastroesophageal reflux disease)   . History of kidney stones   . Hypertension   . 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 INSOMNIA 08/30/2007  . MIGRAINE HEADACHE 08/30/2007  . Neck pain   . Palpitations    occasionally couple times a week  . Sciatica   . Sleep apnea    cpap  . Stroke ([facility]     Surgical History: Past Surgical History:  Procedure Laterality Date  . ABDOMINAL EXPOSURE N/A 05/29/2017   Procedure: ABDOMINAL EXPOSURE;  Surgeon: DAngelia Mould MD;  [facility]  Service: Vascular;  Laterality: N/A;  . ABDOMINAL HYSTERECTOMY    . ANTERIOR LUMBAR FUSION N/A 05/29/2017   Procedure: Anterior Lumbar Interbody Fusion  - Lumbar five sacral one;  Surgeon: JEustace Moore MD;  [place] [facility]  Service: Neurosurgery;  Laterality: N/A;  . CARPAL TUNNEL RELEASE Bilateral   . CHOLECYSTECTOMY    . CHOLECYSTECTOMY N/A 12/29/2016   Procedure: LAPAROSCOPIC CHOLECYSTECTOMY;  Surgeon: BVirl Cagey MD;  [facility]: [facility];  Service: General;  Laterality: N/A;  . LAMINECTOMY WITH POSTERIOR LATERAL ARTHRODESIS LEVEL 1 N/A 02/28/2018   Procedure:  Laminectomy and Foraminotomy Lumbar Five-Sacral One, posterolateral fusion and fixation;  Surgeon: JEustace Moore MD;  [place] [facility]  Service: Neurosurgery;  Laterality: N/A;  posterior  . LUMBAR EPIDURAL INJECTION    . STERIOD INJECTION N/A 01/10/2017   Procedure: MINOR EXPAREL INJECTION;  Surgeon: BVirl Cagey MD;  Location: [facility];  Service: General;  Laterality: N/A;  . TOTAL ABDOMINAL HYSTERECTOMY W/ BILATERAL SALPINGOOPHORECTOMY N/A   . TUBAL LIGATION  2003    Home Medications:  Allergies as of 04/09/2019      Reactions   Adhesive [tape] Other (See Comments)   Skin irritation, patient prefers paper tape.    Celexa [citalopram Hydrobromide] Other (See Comments)   Make patient too carefree      Medication List       Accurate as of April 09, 2019  8:58 AM. If you have any questions, ask your nurse or doctor.        ALPRAZolam 0.5 MG tablet Commonly known as: XANAX TAKE 1 TABLET BY MOUTH AT BEDTIME AS NEEDED FOR SLEEP   amoxicillin 500 MG capsule Commonly known as: AMOXIL Take one capsule po TID for 10 days   atorvastatin 20 MG tablet Commonly known as: LIPITOR Take 1 tablet by mouth once daily   benzonatate 100 MG capsule Commonly known as: TESSALON Take one capsule po every 8 hrs prn cough   cholestyramine 4 GM/DOSE powder Commonly known as: Questran One scoop BID prn diarrhea  cyclobenzaprine 10 MG tablet Commonly known as: FLEXERIL Take 1 tablet (10 mg total) by mouth 3 (three) times daily as needed. for muscle spams   Euthyrox 175 MCG tablet Generic drug: levothyroxine TAKE 1 TABLET BY MOUTH ONCE DAILY BEFORE BREAKFAST   HYDROcodone-acetaminophen 5-325 MG tablet Commonly known as: NORCO/VICODIN Take 1 tablet by mouth every 6 (six) hours as needed for moderate pain.   lisinopril 10 MG tablet Commonly known as: ZESTRIL Take 1 tablet (10 mg total) by mouth daily.   ondansetron 4 MG disintegrating tablet Commonly known as: Zofran ODT Take  1 tablet (4 mg total) by mouth every 8 (eight) hours as needed for nausea or vomiting.   oxyCODONE-acetaminophen 5-325 MG tablet Commonly known as: PERCOCET/ROXICET Take 1 tablet by mouth every 4 (four) hours as needed.   pantoprazole 40 MG tablet Commonly known as: PROTONIX TAKE 1 TABLET BY MOUTH ONCE DAILY 30  MINUTES  PRIOR  TO  A  MEAL       Allergies:  Allergies  Allergen Reactions  . Adhesive [Tape] Other (See Comments)    Skin irritation, patient prefers paper tape.   Teresa Vincent [Citalopram Hydrobromide] Other (See Comments)    Make patient too carefree    Family History: Family History  Problem Relation Age of Onset  . Hypertension Mother   . Thyroid disease Neg Hx     Social History:  reports that she has been smoking cigarettes. She has a 26.00 pack-year smoking history. She has quit using smokeless tobacco. She reports current alcohol use. She reports current drug use. Drug: Marijuana.  ROS:                                        Physical Exam: Temp (!) 95.5 F (35.3 C)   LMP 02/25/2016   Constitutional:  Alert and oriented, No acute distress. HEENT: [place] AT, moist mucus membranes.  Trachea midline, no masses. Cardiovascular: No clubbing, cyanosis, or edema. Respiratory: Normal respiratory effort, no increased work of breathing. GI: Abdomen is soft, nontender, nondistended, no abdominal masses GU: No CVA tenderness Lymph: No cervical or inguinal lymphadenopathy. Skin: No rashes, bruises or suspicious lesions. Neurologic: Grossly intact, no focal deficits, moving all 4 extremities. Psychiatric: Normal mood and affect.  Laboratory Data: Lab Results  Component Value Date   WBC 7.3 03/28/2019   HGB 14.4 03/28/2019   HCT 44.1 03/28/2019   MCV 94.0 03/28/2019   PLT 276 03/28/2019    Lab Results  Component Value Date   CREATININE 0.73 03/28/2019    No results found for: PSA  No results found for: TESTOSTERONE  Lab Results    Component Value Date   HGBA1C 5.8 (H) 05/27/2018    Urinalysis    Component Value Date/Time   COLORURINE YELLOW 03/28/2019 1144   APPEARANCEUR CLOUDY (A) 03/28/2019 1144   LABSPEC 1.020 03/28/2019 1144   PHURINE 7.0 03/28/2019 1144   GLUCOSEU NEGATIVE 03/28/2019 1144   HGBUR LARGE (A) 03/28/2019 1144   BILIRUBINUR NEGATIVE 03/28/2019 1144   KETONESUR NEGATIVE 03/28/2019 1144   PROTEINUR 30 (A) 03/28/2019 1144   UROBILINOGEN 1.0 08/22/2016 0904   UROBILINOGEN 0.2 06/18/2014 2205   NITRITE NEGATIVE 03/28/2019 1144   LEUKOCYTESUR NEGATIVE 03/28/2019 1144    Lab Results  Component Value Date   BACTERIA RARE (A) 03/28/2019    Pertinent Imaging: KUB: right 29m proximal ureteral calculus No results found  for this or any previous visit. No results found for this or any previous visit. No results found for this or any previous visit. No results found for this or any previous visit. No results found for this or any previous visit. No results found for this or any previous visit. No results found for this or any previous visit. Results for orders placed during the hospital encounter of 03/28/19  CT Renal Stone Study   Narrative CLINICAL DATA:  Right-sided flank pain  EXAM: CT ABDOMEN AND PELVIS WITHOUT CONTRAST  TECHNIQUE: Multidetector CT imaging of the abdomen and pelvis was performed following the standard protocol without oral or IV contrast.  COMPARISON:  January 02, 2017  FINDINGS: Lower chest: There is mild bibasilar atelectatic change.  Hepatobiliary: There is a 5 mm probable cyst in the right lobe of the liver adjacent to the fissure for the ligamentum teres. No other liver lesion evident on this noncontrast enhanced study. Gallbladder is absent. There is no appreciable biliary duct dilatation.  Pancreas: There is no apparent pancreatic mass or inflammatory focus.  Spleen: No splenic lesions are evident.  Adrenals/Urinary Tract: Adrenals bilaterally  appear normal. There is a cyst arising from the lateral mid right kidney measuring 2.3 x 2.3 cm. There is mild hydronephrosis on the right. There is no hydronephrosis on the left. There is a calculus in the lower pole of the right kidney measuring 6 x 6 mm. There is a calculus measuring 7 x 6 mm in the proximal right ureter just beyond the ureteropelvic junction. No other ureteral calculi evident. Urinary bladder is midline with wall thickness within normal limits.  Stomach/Bowel: There is no appreciable bowel wall or mesenteric thickening. Terminal ileum appears normal. There is mild lipomatous infiltration of the ileocecal valve. There is no evident bowel obstruction. No free air or portal venous air.  Vascular/Lymphatic: No abdominal aortic aneurysm. There is mild calcification in the distal aorta. No adenopathy is evident in the abdomen or pelvis.  Reproductive: Uterus is absent.  There is no evident pelvic mass.  Other: The appendix appears normal. No evident abscess or ascites in the abdomen or pelvis. There is a small amount of fat in the umbilicus.  Musculoskeletal: Postoperative changes noted at L5 and S1. 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 There is surgical fixation through this area. No blastic or lytic bone lesions evident. No intramuscular lesions are apparent.  IMPRESSION: 1. 7 x 6 mm calculus in the proximal right ureter slightly beyond the right ureteropelvic junction. Mild hydronephrosis on the right.  2. 6 x 6 mm nonobstructing calculus in the lower pole of the right kidney.  3. No bowel obstruction. No abscess in the abdomen or pelvis. Appendix appears normal.  4.  Gallbladder and uterus absent.  5. Postoperative change at L5 and S1. Surgical fixation through an area of spondylolisthesis at L5-S1 with apparent pars defects at L5 bilaterally.   Electronically Signed   By: Lowella Grip III M.D.   On:  03/28/2019 13:14     Assessment & Plan:    1. Right ureteral calculus -We discussed MET, URS and ESWL and the patient elects for ESWL. Risks/benefits/alternatives discussed. rx for flomax and percocet given   No follow-ups on file.  Nicolette Bang, MD  [facility] [place]

## 2019-04-09 NOTE — Progress Notes (Signed)
Lithotripsy folder given to patient and reviewed. Pt voiced understanding.

## 2019-04-10 ENCOUNTER — Ambulatory Visit (HOSPITAL_COMMUNITY): Payer: Medicaid Other

## 2019-04-10 ENCOUNTER — Other Ambulatory Visit: Payer: Self-pay

## 2019-04-10 ENCOUNTER — Ambulatory Visit (HOSPITAL_BASED_OUTPATIENT_CLINIC_OR_DEPARTMENT_OTHER)
Admission: RE | Admit: 2019-04-10 | Discharge: 2019-04-10 | Disposition: A | Discharge status: Home / Self Care | Payer: Medicaid Other | Attending: Urology | Admitting: Urology

## 2019-04-10 ENCOUNTER — Encounter (HOSPITAL_BASED_OUTPATIENT_CLINIC_OR_DEPARTMENT_OTHER)
Admission: RE | Disposition: A | Discharge status: Home / Self Care | Payer: Self-pay | Source: Home / Self Care | Attending: Urology

## 2019-04-10 ENCOUNTER — Encounter (HOSPITAL_BASED_OUTPATIENT_CLINIC_OR_DEPARTMENT_OTHER): Payer: Self-pay | Admitting: Urology

## 2019-04-10 DIAGNOSIS — F419 Anxiety disorder, unspecified: Secondary | ICD-10-CM | POA: Insufficient documentation

## 2019-04-10 DIAGNOSIS — N200 Calculus of kidney: Secondary | ICD-10-CM

## 2019-04-10 DIAGNOSIS — Z79899 Other long term (current) drug therapy: Secondary | ICD-10-CM | POA: Insufficient documentation

## 2019-04-10 DIAGNOSIS — F17210 Nicotine dependence, cigarettes, uncomplicated: Secondary | ICD-10-CM | POA: Insufficient documentation

## 2019-04-10 DIAGNOSIS — E039 Hypothyroidism, unspecified: Secondary | ICD-10-CM | POA: Insufficient documentation

## 2019-04-10 DIAGNOSIS — K219 Gastro-esophageal reflux disease without esophagitis: Secondary | ICD-10-CM | POA: Insufficient documentation

## 2019-04-10 DIAGNOSIS — Z87442 Personal history of urinary calculi: Secondary | ICD-10-CM | POA: Insufficient documentation

## 2019-04-10 DIAGNOSIS — G4730 Sleep apnea, unspecified: Secondary | ICD-10-CM | POA: Insufficient documentation

## 2019-04-10 DIAGNOSIS — I10 Essential (primary) hypertension: Secondary | ICD-10-CM | POA: Insufficient documentation

## 2019-04-10 DIAGNOSIS — J45909 Unspecified asthma, uncomplicated: Secondary | ICD-10-CM | POA: Insufficient documentation

## 2019-04-10 DIAGNOSIS — Z8249 Family history of ischemic heart disease and other diseases of the circulatory system: Secondary | ICD-10-CM | POA: Insufficient documentation

## 2019-04-10 DIAGNOSIS — Z981 Arthrodesis status: Secondary | ICD-10-CM | POA: Insufficient documentation

## 2019-04-10 DIAGNOSIS — Z8673 Personal history of transient ischemic attack (TIA), and cerebral infarction without residual deficits: Secondary | ICD-10-CM | POA: Insufficient documentation

## 2019-04-10 DIAGNOSIS — N201 Calculus of ureter: Secondary | ICD-10-CM | POA: Insufficient documentation

## 2019-04-10 HISTORY — PX: EXTRACORPOREAL SHOCK WAVE LITHOTRIPSY: SHX1557

## 2019-04-10 HISTORY — DX: Other complications of anesthesia, initial encounter: T88.59XA

## 2019-04-10 SURGERY — LITHOTRIPSY, ESWL
Anesthesia: LOCAL | Laterality: Right

## 2019-04-10 MED ORDER — DIPHENHYDRAMINE HCL 25 MG PO CAPS
ORAL_CAPSULE | ORAL | Status: AC
  Filled 2019-04-10: qty 1

## 2019-04-10 MED ORDER — SODIUM CHLORIDE 0.9 % IV SOLN
INTRAVENOUS | Status: DC
  Filled 2019-04-10: qty 1000

## 2019-04-10 MED ORDER — DIAZEPAM 5 MG PO TABS
10.0000 mg | ORAL_TABLET | Freq: Once | ORAL | Status: AC
  Administered 2019-04-10: 10 mg via ORAL
  Filled 2019-04-10: qty 2

## 2019-04-10 MED ORDER — CIPROFLOXACIN HCL 500 MG PO TABS
ORAL_TABLET | ORAL | Status: AC
  Filled 2019-04-10: qty 1

## 2019-04-10 MED ORDER — DIPHENHYDRAMINE HCL 25 MG PO CAPS
25.0000 mg | ORAL_CAPSULE | Freq: Once | ORAL | Status: AC
  Administered 2019-04-10: 25 mg via ORAL
  Filled 2019-04-10: qty 1

## 2019-04-10 MED ORDER — CIPROFLOXACIN HCL 500 MG PO TABS
500.0000 mg | ORAL_TABLET | Freq: Once | ORAL | Status: AC
  Administered 2019-04-10: 500 mg via ORAL
  Filled 2019-04-10: qty 1

## 2019-04-10 MED ORDER — DIAZEPAM 5 MG PO TABS
ORAL_TABLET | ORAL | Status: AC
  Filled 2019-04-10: qty 2

## 2019-04-10 NOTE — Op Note (Signed)
See [facility] OP note scanned into chart. Also because of the size, density, location and other factors that cannot be anticipated I feel this will likely be a staged procedure. This fact supersedes any indication in the scanned [facility] operative note to the contrary.  

## 2019-04-10 NOTE — Interval H&P Note (Signed)
History and Physical Interval Note:  04/10/2019 9:52 AM  Teresa Vincent  has presented today for surgery, with the diagnosis of RIGHT URETERAL CALCULUS.  The various methods of treatment have been discussed with the patient and family. After consideration of risks, benefits and other options for treatment, the patient has consented to  Procedure(s): EXTRACORPOREAL SHOCK WAVE LITHOTRIPSY (ESWL) (Right) as a surgical intervention.  The patient's history has been reviewed, patient examined, no change in status, stable for surgery.  I have reviewed the patient's chart and labs.  Questions were answered to the patient's satisfaction.     Amaury Kuzel D Kagen Kunath

## 2019-04-10 NOTE — H&P (Signed)
Darchelle Nunes [place]  09/06/1980  [id number]  Referring provider: Mikey Kirschner, MD  [address]  [place], [place] [address]  Right flank pain  HPI:  Ms Debellis is a 39yo here for evaluation of nephrolithiasis. She developed sharp, severe, nonraditing right flank pain on 1/7. She presented to the ER. She underwent CT on 03/28/2019 which showed a right 64m UPJ calculus. I reviewed the images and discussed with the patient. She has associated nausea and occasional vomiting. No LUTS. No fevers. No other exacerbating/alleviating. She did have gross hematuria 1 week ago which has since resolved. She had a stone event 2 years ago and passed the calculi.  PMH:      Past Medical History:  Diagnosis Date  . Abnormal Pap smear of cervix   . ANXIETY 08/30/2007  . Asthma   . Chronic back pain   . GERD (gastroesophageal reflux disease)   . History of kidney stones   . Hypertension   . 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 INSOMNIA 08/30/2007  . MIGRAINE HEADACHE 08/30/2007  . Neck pain   . Palpitations    occasionally couple times a week  . Sciatica   . Sleep apnea    cpap  . Stroke ([facility]    Surgical History:       Past Surgical History:  Procedure Laterality Date  . ABDOMINAL EXPOSURE N/A 05/29/2017   Procedure: ABDOMINAL EXPOSURE; Surgeon: DAngelia Mould MD; [facility]; Service: Vascular; Laterality: N/A;  . ABDOMINAL HYSTERECTOMY    . ANTERIOR LUMBAR FUSION N/A 05/29/2017   Procedure: Anterior Lumbar Interbody Fusion - Lumbar five sacral one; Surgeon: JEustace Moore MD; [place] [facility] Service: Neurosurgery; Laterality: N/A;  . CARPAL TUNNEL RELEASE Bilateral   . CHOLECYSTECTOMY    . CHOLECYSTECTOMY N/A 12/29/2016   Procedure: LAPAROSCOPIC CHOLECYSTECTOMY; Surgeon: BVirl Cagey MD; Location: [facility]; Service: General; Laterality: N/A;  . LAMINECTOMY WITH POSTERIOR LATERAL ARTHRODESIS LEVEL 1 N/A 02/28/2018   Procedure: Laminectomy and Foraminotomy  Lumbar Five-Sacral One, posterolateral fusion and fixation; Surgeon: JEustace Moore MD; [place] [facility] Service: Neurosurgery; Laterality: N/A; posterior  . LUMBAR EPIDURAL INJECTION    . STERIOD INJECTION N/A 01/10/2017   Procedure: MINOR EXPAREL INJECTION; Surgeon: BVirl Cagey MD; Location: [facility]; Service: General; Laterality: N/A;  . TOTAL ABDOMINAL HYSTERECTOMY W/ BILATERAL SALPINGOOPHORECTOMY N/A   . TUBAL LIGATION  2003   Home Medications:       Allergies as of 04/09/2019      Reactions   Adhesive [tape] Other (See Comments)   Skin irritation, patient prefers paper tape.    Celexa [citalopram Hydrobromide] Other (See Comments)   Make patient too carefree           Medication List       Accurate as of April 09, 2019 8:58 AM. If you have any questions, ask your nurse or doctor.        ALPRAZolam 0.5 MG tablet  Commonly known as: XANAX  TAKE 1 TABLET BY MOUTH AT BEDTIME AS NEEDED FOR SLEEP   amoxicillin 500 MG capsule  Commonly known as: AMOXIL  Take one capsule po TID for 10 days   atorvastatin 20 MG tablet  Commonly known as: LIPITOR  Take 1 tablet by mouth once daily   benzonatate 100 MG capsule  Commonly known as: TESSALON  Take one capsule po every 8 hrs prn cough   cholestyramine 4 GM/DOSE powder  Commonly known as: Questran  One scoop  BID prn diarrhea   cyclobenzaprine 10 MG tablet  Commonly known as: FLEXERIL  Take 1 tablet (10 mg total) by mouth 3 (three) times daily as needed. for muscle spams   Euthyrox 175 MCG tablet  Generic drug: levothyroxine  TAKE 1 TABLET BY MOUTH ONCE DAILY BEFORE BREAKFAST   HYDROcodone-acetaminophen 5-325 MG tablet  Commonly known as: NORCO/VICODIN  Take 1 tablet by mouth every 6 (six) hours as needed for moderate pain.   lisinopril 10 MG tablet  Commonly known as: ZESTRIL  Take 1 tablet (10 mg total) by mouth daily.   ondansetron 4 MG disintegrating tablet  Commonly known as: Zofran ODT  Take 1 tablet (4  mg total) by mouth every 8 (eight) hours as needed for nausea or vomiting.   oxyCODONE-acetaminophen 5-325 MG tablet  Commonly known as: PERCOCET/ROXICET  Take 1 tablet by mouth every 4 (four) hours as needed.   pantoprazole 40 MG tablet  Commonly known as: PROTONIX  TAKE 1 TABLET BY MOUTH ONCE DAILY 30 MINUTES PRIOR TO A MEAL       Allergies:       Allergies  Allergen Reactions  . Adhesive [Tape] Other (See Comments)    Skin irritation, patient prefers paper tape.   Sheliah Hatch [Citalopram Hydrobromide] Other (See Comments)    Make patient too carefree   Family History:       Family History  Problem Relation Age of Onset  . Hypertension Mother   . Thyroid disease Neg Hx    Social History: reports that she has been smoking cigarettes. She has a 26.00 pack-year smoking history. She has quit using smokeless tobacco. She reports current alcohol use. She reports current drug use. Drug: Marijuana.  ROS:               Physical Exam:  Temp (!) 95.5 F (35.3 C)  LMP 02/25/2016  Constitutional: Alert and oriented, No acute distress.  HEENT:  AT, moist mucus membranes. Trachea midline, no masses.  Cardiovascular: No clubbing, cyanosis, or edema.  Respiratory: Normal respiratory effort, no increased work of breathing.  GI: Abdomen is soft, nontender, nondistended, no abdominal masses  GU: No CVA tenderness  Lymph: No cervical or inguinal lymphadenopathy.  Skin: No rashes, bruises or suspicious lesions.  Neurologic: Grossly intact, no focal deficits, moving all 4 extremities.  Psychiatric: Normal mood and affect.  Laboratory Data:  Recent Labs                                               Recent [employer]     Recent Labs     Recent Labs                      Urinalysis  Labs (Brief)  Recent Labs                       Pertinent Imaging:  KUB: right 45m proximal ureteral calculus  No results found for this or any previous visit.  No results found for this or any previous visit.  No results found for this or any previous visit.  No results found for this or any previous visit.  No results found for this or any previous visit.  No results found for this or any previous visit.  No results found for this or any previous visit.      Results for orders placed during the hospital encounter of 03/28/19  CT Renal Stone Study   Narrative CLINICAL DATA: Right-sided flank pain  EXAM:  CT ABDOMEN AND PELVIS WITHOUT CONTRAST  TECHNIQUE:  Multidetector CT imaging of the abdomen and pelvis was performed  following the standard protocol without oral or IV contrast.  COMPARISON: January 02, 2017  FINDINGS:  Lower chest: There is mild bibasilar atelectatic change.  Hepatobiliary: There is a 5 mm probable cyst in the right lobe of  the liver adjacent to the fissure for the ligamentum teres. No other  liver lesion evident on this noncontrast enhanced study. Gallbladder  is absent. There is no appreciable biliary duct dilatation.  Pancreas: There is no apparent pancreatic mass or inflammatory  focus.  Spleen: No splenic lesions are evident.  Adrenals/Urinary Tract: Adrenals bilaterally appear normal. There is  a cyst arising from the lateral mid right kidney measuring 2.3 x 2.3  cm. There is mild hydronephrosis on the right. There is no  hydronephrosis on the left. There is a calculus in the lower pole of  the right kidney measuring 6 x 6 mm. There is a calculus measuring 7  x 6 mm in the proximal right ureter just beyond the ureteropelvic  junction. No other ureteral calculi evident. Urinary bladder is  midline with wall thickness within normal limits.  Stomach/Bowel: There is no appreciable bowel wall or mesenteric  thickening. Terminal ileum appears normal. There is mild lipomatous  infiltration of  the ileocecal valve. There is no evident bowel  obstruction. No free air or portal venous air.  Vascular/Lymphatic: No abdominal aortic aneurysm. There is mild  calcification in the distal aorta. No adenopathy is evident in the  abdomen or pelvis.  Reproductive: Uterus is absent. There is no evident pelvic mass.  Other: The appendix appears normal. No evident abscess or ascites in  the abdomen or pelvis. There is a small amount of fat in the  umbilicus.  Musculoskeletal: Postoperative changes noted at L5 and S1. Pars  defects noted at L5 bilaterally with mild spondylolisthesis at  L5-S1. There is surgical fixation through this area. No blastic or  lytic bone lesions evident. No intramuscular lesions are apparent.  IMPRESSION:  1. 7 x 6 mm calculus in the proximal right ureter slightly beyond  the right ureteropelvic junction. Mild hydronephrosis on the right.  2. 6 x 6 mm nonobstructing calculus in the lower pole of the right  kidney.  3. No bowel obstruction. No abscess in the abdomen or pelvis.  Appendix appears normal.  4. Gallbladder and uterus absent.  5. Postoperative change at L5 and S1. Surgical fixation through an  area of spondylolisthesis at L5-S1 with apparent pars defects at L5  bilaterally.  Electronically Signed  By: WLowella GripIII M.D.  On: 03/28/2019 13:14   Assessment & Plan:  1. Right ureteral  calculus  -We discussed MET, URS and ESWL and the patient elects for ESWL. Risks/benefits/alternatives discussed. rx for flomax and percocet given  No follow-ups on file.   [facility] [place]

## 2019-04-10 NOTE — Discharge Instructions (Signed)
Post Anesthesia Home Care Instructions  Activity: Get plenty of rest for the remainder of the day. A responsible adult should stay with you for 24 hours following the procedure.  For the next 24 hours, DO NOT: -Drive a car -Operate machinery -Drink alcoholic beverages -Take any medication unless instructed by your physician -Make any legal decisions or sign important papers.  Meals: Start with liquid foods such as gelatin or soup. Progress to regular foods as tolerated. Avoid greasy, spicy, heavy foods. If nausea and/or vomiting occur, drink only clear liquids until the nausea and/or vomiting subsides. Call your physician if vomiting continues.  Special Instructions/Symptoms: Your throat may feel dry or sore from the anesthesia or the breathing tube placed in your throat during surgery. If this causes discomfort, gargle with warm salt water. The discomfort should disappear within 24 hours.  If you had a scopolamine patch placed behind your ear for the management of post- operative nausea and/or vomiting:  1. The medication in the patch is effective for 72 hours, after which it should be removed.  Wrap patch in a tissue and discard in the trash. Wash hands thoroughly with soap and water. 2. You may remove the patch earlier than 72 hours if you experience unpleasant side effects which may include dry mouth, dizziness or visual disturbances. 3. Avoid touching the patch. Wash your hands with soap and water after contact with the patch.    Lithotripsy, Care After This sheet gives you information about how to care for yourself after your procedure. Your health care provider may also give you more specific instructions. If you have problems or questions, contact your health care provider. What can I expect after the procedure? After the procedure, it is common to have:  Some blood in your urine. This should only last for a few days.  Soreness in your back, sides, or upper abdomen for a few  days.  Blotches or bruises on your back where the pressure wave entered the skin.  Pain, discomfort, or nausea when pieces (fragments) of the kidney stone move through the tube that carries urine from the kidney to the bladder (ureter). Stone fragments may pass soon after the procedure, but they may continue to pass for up to 4-8 weeks. ? If you have severe pain or nausea, contact your health care provider. This may be caused by a large stone that was not broken up, and this may mean that you need more treatment.  Some pain or discomfort during urination.  Some pain or discomfort in the lower abdomen or (in men) at the base of the penis. Follow these instructions at home: Medicines  Take over-the-counter and prescription medicines only as told by your health care provider.  If you were prescribed an antibiotic medicine, take it as told by your health care provider. Do not stop taking the antibiotic even if you start to feel better.  Do not drive for 24 hours if you were given a medicine to help you relax (sedative).  Do not drive or use heavy machinery while taking prescription pain medicine. Eating and drinking      Drink enough water and fluids to keep your urine clear or pale yellow. This helps any remaining pieces of the stone to pass. It can also help prevent new stones from forming.  Eat plenty of fresh fruits and vegetables.  Follow instructions from your health care provider about eating and drinking restrictions. You may be instructed: ? To reduce how much salt (sodium) you   eat or drink. Check ingredients and nutrition facts on packaged foods and beverages. ? To reduce how much meat you eat.  Eat the recommended amount of calcium for your age and gender. Ask your health care provider how much calcium you should have. General instructions  Get plenty of rest.  Most people can resume normal activities 1-2 days after the procedure. Ask your health care provider what  activities are safe for you.  Your health care provider may direct you to lie in a certain position (postural drainage) and tap firmly (percuss) over your kidney area to help stone fragments pass. Follow instructions as told by your health care provider.  If directed, strain all urine through the strainer that was provided by your health care provider. ? Keep all fragments for your health care provider to see. Any stones that are found may be sent to a medical lab for examination. The stone may be as small as a grain of salt.  Keep all follow-up visits as told by your health care provider. This is important. Contact a health care provider if:  You have pain that is severe or does not get better with medicine.  You have nausea that is severe or does not go away.  You have blood in your urine longer than your health care provider told you to expect.  You have more blood in your urine.  You have pain during urination that does not go away.  You urinate more frequently than usual and this does not go away.  You develop a rash or any other possible signs of an allergic reaction. Get help right away if:  You have severe pain in your back, sides, or upper abdomen.  You have severe pain while urinating.  Your urine is very dark red.  You have blood in your stool (feces).  You cannot pass any urine at all.  You feel a strong urge to urinate after emptying your bladder.  You have a fever or chills.  You develop shortness of breath, difficulty breathing, or chest pain.  You have severe nausea that leads to persistent vomiting.  You faint. Summary  After this procedure, it is common to have some pain, discomfort, or nausea when pieces (fragments) of the kidney stone move through the tube that carries urine from the kidney to the bladder (ureter). If this pain or nausea is severe, however, you should contact your health care provider.  Most people can resume normal activities 1-2  days after the procedure. Ask your health care provider what activities are safe for you.  Drink enough water and fluids to keep your urine clear or pale yellow. This helps any remaining pieces of the stone to pass, and it can help prevent new stones from forming.  If directed, strain your urine and keep all fragments for your health care provider to see. Fragments or stones may be as small as a grain of salt.  Get help right away if you have severe pain in your back, sides, or upper abdomen or have severe pain while urinating. This information is not intended to replace advice given to you by your health care provider. Make sure you discuss any questions you have with your health care provider. Document Revised: 06/17/2018 Document Reviewed: 01/26/2016 Elsevier Patient Education  2020 [employer]. See [facility] discharge instructions in chart. 

## 2019-04-13 ENCOUNTER — Encounter: Payer: Self-pay | Admitting: Urology

## 2019-04-18 ENCOUNTER — Ambulatory Visit: Payer: 59 | Admitting: "Endocrinology

## 2019-04-21 ENCOUNTER — Encounter: Payer: Self-pay | Admitting: Family Medicine

## 2019-04-23 ENCOUNTER — Telehealth: Payer: Self-pay | Admitting: "Endocrinology

## 2019-04-23 ENCOUNTER — Ambulatory Visit (HOSPITAL_COMMUNITY)
Admission: RE | Admit: 2019-04-23 | Discharge: 2019-04-23 | Disposition: A | Discharge status: Home / Self Care | Payer: Self-pay | Source: Ambulatory Visit | Attending: Urology | Admitting: Urology

## 2019-04-23 ENCOUNTER — Ambulatory Visit (INDEPENDENT_AMBULATORY_CARE_PROVIDER_SITE_OTHER): Payer: Self-pay | Admitting: Urology

## 2019-04-23 ENCOUNTER — Other Ambulatory Visit (HOSPITAL_COMMUNITY)
Admission: AD | Admit: 2019-04-23 | Discharge: 2019-04-23 | Disposition: A | Discharge status: Home / Self Care | Payer: Self-pay | Source: Ambulatory Visit | Attending: Urology | Admitting: Urology

## 2019-04-23 ENCOUNTER — Other Ambulatory Visit: Payer: Self-pay

## 2019-04-23 ENCOUNTER — Encounter: Payer: Self-pay | Admitting: Urology

## 2019-04-23 VITALS — BP 134/81 | HR 66 | Temp 97.5°F | Ht 67.0 in | Wt 233.0 lb

## 2019-04-23 DIAGNOSIS — N200 Calculus of kidney: Secondary | ICD-10-CM | POA: Insufficient documentation

## 2019-04-23 DIAGNOSIS — R3129 Other microscopic hematuria: Secondary | ICD-10-CM

## 2019-04-23 LAB — POCT URINALYSIS DIPSTICK
Bilirubin, UA: NEGATIVE
Glucose, UA: NEGATIVE
Ketones, UA: NEGATIVE
Leukocytes, UA: NEGATIVE
Nitrite, UA: NEGATIVE
Protein, UA: NEGATIVE
Spec Grav, UA: >=1.03 — AB (ref 1.010–1.025)
Urobilinogen, UA: NEGATIVE E.U./dL — AB
pH, UA: 5 (ref 5.0–8.0)

## 2019-04-23 MED ORDER — OXYCODONE-ACETAMINOPHEN 5-325 MG PO TABS: 1.0000 | ORAL_TABLET | ORAL | 0 refills | Status: DC | PRN

## 2019-04-23 NOTE — Telephone Encounter (Signed)
Pt went to have her labs done today and it was going to be $600.00. she has no insurance right now. She is asking what do you suggest she do. I did advise her that you would need her lab results for the appt. Please advise.

## 2019-04-23 NOTE — Progress Notes (Signed)
Urological Symptom Review  Patient is experiencing the following symptoms:  Hematuria  Review of Systems  Gastrointestinal (upper)  : Negative for upper GI symptoms  Gastrointestinal (lower) : Negative for lower GI symptoms  Constitutional : Negative for symptoms  Skin: Negative for skin symptoms  Eyes: Negative for eye symptoms  Ear/Nose/Throat : Negative for Ear/Nose/Throat symptoms  Hematologic/Lymphatic: Negative for Hematologic/Lymphatic symptoms  Cardiovascular : Negative for cardiovascular symptoms  Respiratory : Negative for respiratory symptoms  Endocrine: Negative for endocrine symptoms  Musculoskeletal: Negative for musculoskeletal symptoms  Neurological: Negative for neurological symptoms  Psychologic: Negative for psychiatric symptoms

## 2019-04-23 NOTE — Telephone Encounter (Signed)
She can only do TSH and free t4, she can ask for a non -insured discount as well.

## 2019-04-23 NOTE — Progress Notes (Signed)
04/23/2019 10:40 AM   Teresa Vincent 1980-12-07 [id number]  Referring provider: [name], MD [address] [place],  [place] [address]  nephrolithiasis  HPI: Teresa Vincent is a 38yo here for followup for nephrolithiasis. She underwent ESWL on 1/21 and passed multiple fragments which she brought with her. She has intermittent flank pain and urinary urgency/frequency. KUB images reviewed and discussed with the patient. KUB does not show any definitve calculi. No hematuria.   PMH: Past Medical History:  Diagnosis Date  . Abnormal Pap smear of cervix   . ANXIETY 08/30/2007  . Asthma   . Chronic back pain   . Complication of anesthesia    hx of waking up during anesthesia  . GERD (gastroesophageal reflux disease)   . History of kidney stones   . Hypertension   . 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 INSOMNIA 08/30/2007  . MIGRAINE HEADACHE 08/30/2007  . Neck pain   . Palpitations    occasionally couple times a week  . Sciatica   . Sleep apnea    cpap  . Stroke [facility])     Surgical History: Past Surgical History:  Procedure Laterality Date  . ABDOMINAL EXPOSURE N/A 05/29/2017   Procedure: ABDOMINAL EXPOSURE;  Surgeon: Chuck Hint, MD;  Location[facility] OR;  Service: Vascular;  Laterality: N/A;  . ABDOMINAL HYSTERECTOMY    . ANTERIOR LUMBAR FUSION N/A 05/29/2017   Procedure: Anterior Lumbar Interbody Fusion  - Lumbar five sacral one;  Surgeon: Tia Alert, MD;  Location: [facility] OR;  Service: Neurosurgery;  Laterality: N/A;  . CARPAL TUNNEL RELEASE Bilateral   . CHOLECYSTECTOMY    . CHOLECYSTECTOMY N/A 12/29/2016   Procedure: LAPAROSCOPIC CHOLECYSTECTOMY;  Surgeon: [name], MD;  Location: [facility] ORS;  Service: General;  Laterality: N/A;  . EXTRACORPOREAL SHOCK WAVE LITHOTRIPSY Right 04/10/2019   Procedure: EXTRACORPOREAL SHOCK WAVE LITHOTRIPSY (ESWL);  Surgeon: Noel Christmas, MD;  Location: [facility];  Service:  Urology;  Laterality: Right;  . LAMINECTOMY WITH POSTERIOR LATERAL ARTHRODESIS LEVEL 1 N/A 02/28/2018   Procedure: Laminectomy and Foraminotomy Lumbar Five-Sacral One, posterolateral fusion and fixation;  Surgeon: Tia Alert, MD;  Location: [facility] OR;  Service: Neurosurgery;  Laterality: N/A;  posterior  . LUMBAR EPIDURAL INJECTION    . STERIOD INJECTION N/A 01/10/2017   Procedure: MINOR EXPAREL INJECTION;  Surgeon: [name], MD;  Location: [facility] ORS;  Service: General;  Laterality: N/A;  . TOTAL ABDOMINAL HYSTERECTOMY W/ BILATERAL SALPINGOOPHORECTOMY N/A   . TUBAL LIGATION  2003    Home Medications:  Allergies as of 04/23/2019      Reactions   Adhesive [tape] Other (See Comments)   Skin irritation, patient prefers paper tape.    Celexa [citalopram Hydrobromide] Other (See Comments)   Make patient too carefree      Medication List       Accurate as of April 23, 2019 10:40 AM. If you have any questions, ask your nurse or doctor.        ALPRAZolam 0.5 MG tablet Commonly known as: XANAX TAKE 1 TABLET BY MOUTH AT BEDTIME AS NEEDED FOR SLEEP   atorvastatin 20 MG tablet Commonly known as: LIPITOR Take 1 tablet by mouth once daily   benzonatate 100 MG capsule Commonly known as: TESSALON Take one capsule po every 8 hrs prn cough   cholestyramine 4 GM/DOSE powder Commonly known as: Questran One scoop BID prn diarrhea   cyclobenzaprine 10 MG tablet Commonly known as:  FLEXERIL Take 1 tablet (10 mg total) by mouth 3 (three) times daily as needed. for muscle spams   Euthyrox 175 MCG tablet Generic drug: levothyroxine TAKE 1 TABLET BY MOUTH ONCE DAILY BEFORE BREAKFAST   lisinopril 10 MG tablet Commonly known as: ZESTRIL Take 1 tablet (10 mg total) by mouth daily.   metoprolol tartrate 50 MG tablet Commonly known as: LOPRESSOR SMARTSIG:.5 Tablet(s) By Mouth Twice Daily   ondansetron 4 MG disintegrating tablet Commonly known as: Zofran ODT Take 1 tablet (4 mg total)  by mouth every 8 (eight) hours as needed for nausea or vomiting.   oxyCODONE-acetaminophen 5-325 MG tablet Commonly known as: PERCOCET/ROXICET Take 1 tablet by mouth every 4 (four) hours as needed for moderate pain.   pantoprazole 40 MG tablet Commonly known as: PROTONIX TAKE 1 TABLET BY MOUTH ONCE DAILY 30  MINUTES  PRIOR  TO  A  MEAL   tamsulosin 0.4 MG Caps capsule Commonly known as: FLOMAX Take 1 capsule (0.4 mg total) by mouth daily after supper.       Allergies:  Allergies  Allergen Reactions  . Adhesive [Tape] Other (See Comments)    Skin irritation, patient prefers paper tape.   Budd Palmer [Citalopram Hydrobromide] Other (See Comments)    Make patient too carefree    Family History: Family History  Problem Relation Age of Onset  . Hypertension Mother   . Thyroid disease Neg Hx     Social History:  reports that she has been smoking cigarettes. She has a 26.00 pack-year smoking history. She has quit using smokeless tobacco. She reports current alcohol use. She reports current drug use. Drug: Marijuana.  ROS: All other review of systems were reviewed and are negative except what is noted above in HPI  Physical Exam: BP 134/81   Pulse 66   Temp (!) 97.5 F (36.4 C)   Ht 5\' 7"  (1.702 m)   Wt 233 lb (105.7 kg)   LMP 02/25/2016   BMI 36.49 kg/m   Constitutional:  Alert and oriented, No acute distress. HEENT: [place] AT, moist mucus membranes.  Trachea midline, no masses. Cardiovascular: No clubbing, cyanosis, or edema. Respiratory: Normal respiratory effort, no increased work of breathing. GI: Abdomen is soft, nontender, nondistended, no abdominal masses GU: No CVA tenderness Lymph: No cervical or inguinal lymphadenopathy. Skin: No rashes, bruises or suspicious lesions. Neurologic: Grossly intact, no focal deficits, moving all 4 extremities. Psychiatric: Normal mood and affect.  Laboratory Data: Lab Results  Component Value Date   WBC 7.3 03/28/2019   HGB 14.4  03/28/2019   HCT 44.1 03/28/2019   MCV 94.0 03/28/2019   PLT 276 03/28/2019    Lab Results  Component Value Date   CREATININE 0.73 03/28/2019    No results found for: PSA  No results found for: TESTOSTERONE  Lab Results  Component Value Date   HGBA1C 5.8 (H) 05/27/2018    Urinalysis    Component Value Date/Time   COLORURINE YELLOW 03/28/2019 1144   APPEARANCEUR CLOUDY (A) 03/28/2019 1144   LABSPEC 1.020 03/28/2019 1144   PHURINE 7.0 03/28/2019 1144   GLUCOSEU NEGATIVE 03/28/2019 1144   HGBUR LARGE (A) 03/28/2019 1144   BILIRUBINUR moderate 04/09/2019 0909   KETONESUR NEGATIVE 03/28/2019 1144   PROTEINUR Negative 04/09/2019 0909   PROTEINUR 30 (A) 03/28/2019 1144   UROBILINOGEN negative (A) 04/09/2019 0909   UROBILINOGEN 0.2 06/18/2014 2205   NITRITE neg 04/09/2019 0909   NITRITE NEGATIVE 03/28/2019 1144   LEUKOCYTESUR Negative 04/09/2019 0909  LEUKOCYTESUR NEGATIVE 03/28/2019 1144    Lab Results  Component Value Date   BACTERIA RARE (A) 03/28/2019    Pertinent Imaging: KUB Results for orders placed during the hospital encounter of 04/10/19  DG Abd 1 View - KUB   Narrative CLINICAL DATA:  Known RIGHT side kidney stones, pre lithotripsy  EXAM: ABDOMEN - 1 VIEW  COMPARISON:  04/09/2019  FINDINGS: 5 mm calculus inferior pole RIGHT kidney unchanged.  Questionable paraspinal calcification 6 x 3 mm projecting over RIGHT transverse process of L3, question representing the proximal RIGHT ureteral versus UPJ calculus seen on previous exam.  No other urinary tract calcifications.  Bowel gas pattern normal.  Surgical clips RIGHT upper quadrant and LEFT pelvis.  Prior lumbar fusion L5-S1.  IMPRESSION: 5 mm inferior pole RIGHT renal calculus.  Questionable visualization of a 6 x 3 mm calculus at the RIGHT spinous process of L3 question   Electronically Signed   By: Ulyses Southward M.D.   On: 04/10/2019 08:37    No results found for this or any  previous visit. No results found for this or any previous visit. No results found for this or any previous visit. No results found for this or any previous visit. No results found for this or any previous visit. No results found for this or any previous visit. Results for orders placed during the hospital encounter of 03/28/19  CT Renal Stone Study   Narrative CLINICAL DATA:  Right-sided flank pain  EXAM: CT ABDOMEN AND PELVIS WITHOUT CONTRAST  TECHNIQUE: Multidetector CT imaging of the abdomen and pelvis was performed following the standard protocol without oral or IV contrast.  COMPARISON:  January 02, 2017  FINDINGS: Lower chest: There is mild bibasilar atelectatic change.  Hepatobiliary: There is a 5 mm probable cyst in the right lobe of the liver adjacent to the fissure for the ligamentum teres. No other liver lesion evident on this noncontrast enhanced study. Gallbladder is absent. There is no appreciable biliary duct dilatation.  Pancreas: There is no apparent pancreatic mass or inflammatory focus.  Spleen: No splenic lesions are evident.  Adrenals/Urinary Tract: Adrenals bilaterally appear normal. There is a cyst arising from the lateral mid right kidney measuring 2.3 x 2.3 cm. There is mild hydronephrosis on the right. There is no hydronephrosis on the left. There is a calculus in the lower pole of the right kidney measuring 6 x 6 mm. There is a calculus measuring 7 x 6 mm in the proximal right ureter just beyond the ureteropelvic junction. No other ureteral calculi evident. Urinary bladder is midline with wall thickness within normal limits.  Stomach/Bowel: There is no appreciable bowel wall or mesenteric thickening. Terminal ileum appears normal. There is mild lipomatous infiltration of the ileocecal valve. There is no evident bowel obstruction. No free air or portal venous air.  Vascular/Lymphatic: No abdominal aortic aneurysm. There is mild calcification in  the distal aorta. No adenopathy is evident in the abdomen or pelvis.  Reproductive: Uterus is absent.  There is no evident pelvic mass.  Other: The appendix appears normal. No evident abscess or ascites in the abdomen or pelvis. There is a small amount of fat in the umbilicus.  Musculoskeletal: Postoperative changes noted at L5 and S1. 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 There is surgical fixation through this area. No blastic or lytic bone lesions evident. No intramuscular lesions are apparent.  IMPRESSION: 1. 7 x 6 mm calculus in the proximal right ureter slightly  beyond the right ureteropelvic junction. Mild hydronephrosis on the right.  2. 6 x 6 mm nonobstructing calculus in the lower pole of the right kidney.  3. No bowel obstruction. No abscess in the abdomen or pelvis. Appendix appears normal.  4.  Gallbladder and uterus absent.  5. Postoperative change at L5 and S1. Surgical fixation through an area of spondylolisthesis at L5-S1 with apparent pars defects at L5 bilaterally.   Electronically Signed   By: Lowella Grip III M.D.   On: 03/28/2019 13:14     Assessment & Plan:    1. Nephrolithiasis -continue medical expulsive therapy - Abdomen 1 view (KUB); Future -RTC 4-6 weeks with KUB     No follow-ups on file.  Nicolette Bang, MD  [facility] [place]

## 2019-04-23 NOTE — Telephone Encounter (Signed)
Pt.notified

## 2019-04-23 NOTE — Addendum Note (Signed)
Addended by: Malen Gauze on: 04/23/2019 10:47 AM   Modules accepted: Orders

## 2019-04-23 NOTE — Patient Instructions (Signed)

## 2019-04-28 ENCOUNTER — Other Ambulatory Visit: Payer: Self-pay | Admitting: Family Medicine

## 2019-04-28 LAB — CALCULI, WITH PHOTOGRAPH (CLINICAL LAB)
Calcium Oxalate Dihydrate: 30 %
Calcium Oxalate Monohydrate: 65 %
Hydroxyapatite: 5 %
Weight Calculi: 54 mg

## 2019-05-12 ENCOUNTER — Other Ambulatory Visit: Payer: Self-pay

## 2019-05-12 ENCOUNTER — Ambulatory Visit (INDEPENDENT_AMBULATORY_CARE_PROVIDER_SITE_OTHER): Payer: Self-pay | Admitting: Family Medicine

## 2019-05-12 DIAGNOSIS — K2100 Gastro-esophageal reflux disease with esophagitis, without bleeding: Secondary | ICD-10-CM

## 2019-05-12 MED ORDER — PANTOPRAZOLE SODIUM 40 MG PO TBEC: DELAYED_RELEASE_TABLET | ORAL | 11 refills | Status: DC

## 2019-05-12 NOTE — Progress Notes (Signed)
   Subjective:  Audio video  Patient ID: Teresa Vincent, female    DOB: [birth year], 38 y.o.   MRN: [id number]  HPI  Patient states she is normally on acid reflux medication and had not taken it in a week and was woke up Saturday and felt like she had swallowed fire and couldn't breathe or speak for a minute. Patient has restarted medication.  Virtual Visit via Video Note  I connected with Teresa Vincent on 05/12/19 at  4:10 PM EST by a video enabled telemedicine application and verified that I am speaking with the correct person using two identifiers.  Location: Patient: home Provider: office   I discussed the limitations of evaluation and management by telemedicine and the availability of in person appointments. The patient expressed understanding and agreed to proceed.  History of Present Illness:    Observations/Objective:   Assessment and Plan:   Follow Up Instructions:    I discussed the assessment and treatment plan with the patient. The patient was provided an opportunity to ask questions and all were answered. The patient agreed with the plan and demonstrated an understanding of the instructions.   The patient was advised to call back or seek an in-person evaluation if the symptoms worsen or if the condition fails to improve as anticipated.  I provided 21 minutes of non-face-to-face time during this encounter.   Patient has longstanding history of reflux.  Ran out of her Protonix recently.  Was out of the medication for a week plus.  Then started developing fairly severe symptoms  Very severe burning in the throat usually getting are after going to bed and at nighttime.  Accompanied by substantial laryngeal spasm and inability to speak and trouble breathing    Review of Systems No fever no chest pain no abdominal pain    Objective:   Physical Exam   Virtual     Assessment & Plan:  Impression flare of reflux.  Resume Protonix.  Add as needed  Maalox.  No major work-up needed.  Avoid caffeine if at all possible along with aggravating foods rationale discussed questions answered

## 2019-05-21 ENCOUNTER — Ambulatory Visit: Payer: Self-pay | Admitting: Urology

## 2019-05-31 ENCOUNTER — Other Ambulatory Visit: Payer: Self-pay | Admitting: Family Medicine

## 2019-06-03 ENCOUNTER — Telehealth: Payer: Self-pay | Admitting: Urology

## 2019-06-03 NOTE — Telephone Encounter (Signed)
Pt called today stating she is still having periods of frequency and small hematuria at times. Pt missed her 4/6 week fu with KUB post surgery. Instructed pt to go ahead and get KUB performed this week and MD would assess the xray regarding pt symptoms. Pt voiced understanding and stated she would " go tomorrow after work"

## 2019-06-03 NOTE — Telephone Encounter (Signed)
Pt requests a nurse return her call regarding urinary issues.

## 2019-06-04 ENCOUNTER — Ambulatory Visit (INDEPENDENT_AMBULATORY_CARE_PROVIDER_SITE_OTHER): Payer: Medicaid Other | Admitting: Urology

## 2019-06-04 ENCOUNTER — Ambulatory Visit (HOSPITAL_COMMUNITY)
Admission: RE | Admit: 2019-06-04 | Discharge: 2019-06-04 | Disposition: A | Discharge status: Home / Self Care | Payer: Self-pay | Source: Ambulatory Visit | Attending: Urology | Admitting: Urology

## 2019-06-04 ENCOUNTER — Encounter: Payer: Self-pay | Admitting: Urology

## 2019-06-04 ENCOUNTER — Ambulatory Visit (HOSPITAL_COMMUNITY)
Admission: RE | Admit: 2019-06-04 | Discharge: 2019-06-04 | Disposition: A | Discharge status: Home / Self Care | Payer: Medicaid Other | Source: Ambulatory Visit | Attending: Urology | Admitting: Urology

## 2019-06-04 ENCOUNTER — Other Ambulatory Visit: Payer: Self-pay

## 2019-06-04 VITALS — BP 143/81 | HR 76 | Temp 96.1°F

## 2019-06-04 DIAGNOSIS — N200 Calculus of kidney: Secondary | ICD-10-CM | POA: Insufficient documentation

## 2019-06-04 LAB — POCT URINALYSIS DIPSTICK
Bilirubin, UA: NEGATIVE
Glucose, UA: NEGATIVE
Ketones, UA: NEGATIVE
Leukocytes, UA: NEGATIVE
Nitrite, UA: NEGATIVE
Protein, UA: POSITIVE — AB
Spec Grav, UA: 1.025 (ref 1.010–1.025)
Urobilinogen, UA: 0.2 E.U./dL
pH, UA: 6 (ref 5.0–8.0)

## 2019-06-04 MED ORDER — OXYCODONE-ACETAMINOPHEN 5-325 MG PO TABS: 1.0000 | ORAL_TABLET | ORAL | 0 refills | Status: DC | PRN

## 2019-06-04 NOTE — Patient Instructions (Signed)

## 2019-06-04 NOTE — Progress Notes (Signed)
Urological Symptom Review  Patient is experiencing the following symptoms: Frequent urination Blood in urine   Review of Systems  Gastrointestinal (upper)  : Negative for upper GI symptoms  Gastrointestinal (lower) : Negative for lower GI symptoms  Constitutional : Negative for symptoms  Skin: Negative for skin symptoms  Eyes: Negative for eye symptoms  Ear/Nose/Throat : Negative for Ear/Nose/Throat symptoms  Hematologic/Lymphatic: Negative for Hematologic/Lymphatic symptoms  Cardiovascular : Negative for cardiovascular symptoms  Respiratory : Negative for respiratory symptoms  Endocrine: Negative for endocrine symptoms  Musculoskeletal: Back pain  Neurological: Negative for neurological symptoms  Psychologic: Negative for psychiatric symptoms

## 2019-06-04 NOTE — Progress Notes (Signed)
06/04/2019 11:50 AM   Teresa Vincent Mar 22, 1980 [id number]  Referring provider: Mikey Vincent, [place],  [place] [address]  Urinary urgency  HPI: [name] is a 39yo here for followup for nephrolithiasis and new onset urinary urgency. She underwent ESWL 1/21 and passed numerous pices. Over the past 10 days she has noted worsening right flank pain with associated urinary urgency, frequency and gross hematuria. CT scan from today shows a new right distal ureteral calculus. No fevers/chills/sweats. She has no urinary urgency frequency and urge incontinence   PMH: Past Medical History:  Diagnosis Date  . Abnormal Pap smear of cervix   . ANXIETY 08/30/2007  . Asthma   . Chronic back pain   . Complication of anesthesia    hx of waking up during anesthesia  . GERD (gastroesophageal reflux disease)   . History of kidney stones   . Hypertension   . 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 INSOMNIA 08/30/2007  . MIGRAINE HEADACHE 08/30/2007  . Neck pain   . Palpitations    occasionally couple times a week  . Sciatica   . Sleep apnea    cpap  . Stroke [facility])     Surgical History: Past Surgical History:  Procedure Laterality Date  . ABDOMINAL EXPOSURE N/A 05/29/2017   Procedure: ABDOMINAL EXPOSURE;  Surgeon: Teresa Mould, MD;  Location: [facility] OR;  Service: Vascular;  Laterality: N/A;  . ABDOMINAL HYSTERECTOMY    . ANTERIOR LUMBAR FUSION N/A 05/29/2017   Procedure: Anterior Lumbar Interbody Fusion  - Lumbar five sacral one;  Surgeon: Teresa Moore, MD;  Location: [facility];  Service: Neurosurgery;  Laterality: N/A;  . CARPAL TUNNEL RELEASE Bilateral   . CHOLECYSTECTOMY    . CHOLECYSTECTOMY N/A 12/29/2016   Procedure: LAPAROSCOPIC CHOLECYSTECTOMY;  Surgeon: Teresa Cagey, MD;  Location: [facility];  Service: General;  Laterality: N/A;  . EXTRACORPOREAL SHOCK WAVE LITHOTRIPSY Right 04/10/2019   Procedure: EXTRACORPOREAL SHOCK WAVE  LITHOTRIPSY (ESWL);  Surgeon: Teresa Fries, MD;  Location: [facility];  Service: Urology;  Laterality: Right;  . LAMINECTOMY WITH POSTERIOR LATERAL ARTHRODESIS LEVEL 1 N/A 02/28/2018   Procedure: Laminectomy and Foraminotomy Lumbar Five-Sacral One, posterolateral fusion and fixation;  Surgeon: Teresa Moore, MD;  Location: [facility];  Service: Neurosurgery;  Laterality: N/A;  posterior  . LUMBAR EPIDURAL INJECTION    . STERIOD INJECTION N/A 01/10/2017   Procedure: MINOR EXPAREL INJECTION;  Surgeon: Teresa Cagey, MD;  Location: [facility];  Service: General;  Laterality: N/A;  . TOTAL ABDOMINAL HYSTERECTOMY W/ BILATERAL SALPINGOOPHORECTOMY N/A   . TUBAL LIGATION  2003    Home Medications:  Allergies as of 06/04/2019      Reactions   Adhesive [tape] Other (See Comments)   Skin irritation, patient prefers paper tape.    Celexa [citalopram Hydrobromide] Other (See Comments)   Make patient too carefree      Medication List       Accurate as of June 04, 2019 11:50 AM. If you have any questions, ask your nurse or doctor.        ALPRAZolam 0.5 MG tablet Commonly known as: XANAX TAKE 1 TABLET BY MOUTH AT BEDTIME AS NEEDED FOR SLEEP   atorvastatin 20 MG tablet Commonly known as: LIPITOR Take 1 tablet by mouth once daily   cholestyramine 4 GM/DOSE powder Commonly known as: Questran One scoop BID prn diarrhea   cyclobenzaprine 10 MG tablet Commonly known as: FLEXERIL Take  1 tablet (10 mg total) by mouth 3 (three) times daily as needed. for muscle spams   Euthyrox 175 MCG tablet Generic drug: levothyroxine TAKE 1 TABLET BY MOUTH ONCE DAILY BEFORE BREAKFAST   lisinopril 10 MG tablet Commonly known as: ZESTRIL Take 1 tablet (10 mg total) by mouth daily.   metoprolol tartrate 50 MG tablet Commonly known as: LOPRESSOR SMARTSIG:.5 Tablet(s) By Mouth Twice Daily   ondansetron 4 MG disintegrating tablet Commonly known as: ZOFRAN-ODT DISSOLVE 1 TABLET IN MOUTH  EVERY 8 HOURS AS NEEDED FOR NAUSEA AND VOMITING   oxyCODONE-acetaminophen 5-325 MG tablet Commonly known as: PERCOCET/ROXICET Take 1 tablet by mouth every 4 (four) hours as needed for moderate pain.   pantoprazole 40 MG tablet Commonly known as: PROTONIX TAKE 1 TABLET BY MOUTH ONCE DAILY 30  MINUTES  PRIOR  TO  A  MEAL   tamsulosin 0.4 MG Caps capsule Commonly known as: FLOMAX Take 1 capsule (0.4 mg total) by mouth daily after supper.       Allergies:  Allergies  Allergen Reactions  . Adhesive [Tape] Other (See Comments)    Skin irritation, patient prefers paper tape.   Teresa Vincent [Citalopram Hydrobromide] Other (See Comments)    Make patient too carefree    Family History: Family History  Problem Relation Age of Onset  . Hypertension Mother   . Thyroid disease Neg Hx     Social History:  reports that she has been smoking cigarettes. She has a 26.00 pack-year smoking history. She has quit using smokeless tobacco. She reports current alcohol use. She reports current drug use. Drug: Marijuana.  ROS: All other review of systems were reviewed and are negative except what is noted above in HPI  Physical Exam: BP (!) 143/81   Pulse 76   Temp (!) 96.1 F (35.6 C)   LMP 02/25/2016   Constitutional:  Alert and oriented, No acute distress. HEENT: [place] AT, moist mucus membranes.  Trachea midline, no masses. Cardiovascular: No clubbing, cyanosis, or edema. Respiratory: Normal respiratory effort, no increased work of breathing. GI: Abdomen is soft, nontender, nondistended, no abdominal masses GU: No CVA tenderness Lymph: No cervical or inguinal lymphadenopathy. Skin: No rashes, bruises or suspicious lesions. Neurologic: Grossly intact, no focal deficits, moving all 4 extremities. Psychiatric: Normal mood and affect.  Laboratory Data: Lab Results  Component Value Date   WBC 7.3 03/28/2019   HGB 14.4 03/28/2019   HCT 44.1 03/28/2019   MCV 94.0 03/28/2019   PLT 276  03/28/2019    Lab Results  Component Value Date   CREATININE 0.73 03/28/2019    No results found for: PSA  No results found for: TESTOSTERONE  Lab Results  Component Value Date   HGBA1C 5.8 (H) 05/27/2018    Urinalysis    Component Value Date/Time   COLORURINE YELLOW 03/28/2019 1144   APPEARANCEUR CLOUDY (A) 03/28/2019 1144   LABSPEC 1.020 03/28/2019 1144   PHURINE 7.0 03/28/2019 1144   GLUCOSEU NEGATIVE 03/28/2019 1144   HGBUR LARGE (A) 03/28/2019 1144   BILIRUBINUR negative 06/04/2019 1102   KETONESUR NEGATIVE 03/28/2019 1144   PROTEINUR Positive (A) 06/04/2019 1102   PROTEINUR 30 (A) 03/28/2019 1144   UROBILINOGEN 0.2 06/04/2019 1102   UROBILINOGEN 0.2 06/18/2014 2205   NITRITE negative 06/04/2019 1102   NITRITE NEGATIVE 03/28/2019 1144   LEUKOCYTESUR Negative 06/04/2019 1102   LEUKOCYTESUR NEGATIVE 03/28/2019 1144    Lab Results  Component Value Date   BACTERIA RARE (A) 03/28/2019    Pertinent  Imaging: CT stone study: Images reviewed and discussed with the patient Results for orders placed during the hospital encounter of 04/23/19  Abdomen 1 view (KUB)   Narrative CLINICAL DATA:  Nephrolithiasis, right-sided lithotripsy 1/21  EXAM: ABDOMEN - 1 VIEW  COMPARISON:  04/10/2019  FINDINGS: Two supine frontal views of the abdomen and pelvis are obtained, excluding the hemidiaphragms in left flank by collimation. The right-sided renal calculus seen on previous study is no longer identified. There is a faint 3 mm calcification along the right lateral margin of the sacrum which could reflect fragmented calculus within the distal right ureter. No other urinary tract calculi. Bowel gas pattern is unremarkable.  IMPRESSION: 1. Likely 3 mm calculus fragment within the distal right ureter after interval lithotripsy.   Electronically Signed   By: Randa Ngo M.D.   On: 04/23/2019 13:52    No results found for this or any previous visit. No results  found for this or any previous visit. No results found for this or any previous visit. No results found for this or any previous visit. No results found for this or any previous visit. No results found for this or any previous visit. Results for orders placed during the hospital encounter of 03/28/19  CT Renal Stone Study   Narrative CLINICAL DATA:  Right-sided flank pain  EXAM: CT ABDOMEN AND PELVIS WITHOUT CONTRAST  TECHNIQUE: Multidetector CT imaging of the abdomen and pelvis was performed following the standard protocol without oral or IV contrast.  COMPARISON:  January 02, 2017  FINDINGS: Lower chest: There is mild bibasilar atelectatic change.  Hepatobiliary: There is a 5 mm probable cyst in the right lobe of the liver adjacent to the fissure for the ligamentum teres. No other liver lesion evident on this noncontrast enhanced study. Gallbladder is absent. There is no appreciable biliary duct dilatation.  Pancreas: There is no apparent pancreatic mass or inflammatory focus.  Spleen: No splenic lesions are evident.  Adrenals/Urinary Tract: Adrenals bilaterally appear normal. There is a cyst arising from the lateral mid right kidney measuring 2.3 x 2.3 cm. There is mild hydronephrosis on the right. There is no hydronephrosis on the left. There is a calculus in the lower pole of the right kidney measuring 6 x 6 mm. There is a calculus measuring 7 x 6 mm in the proximal right ureter just beyond the ureteropelvic junction. No other ureteral calculi evident. Urinary bladder is midline with wall thickness within normal limits.  Stomach/Bowel: There is no appreciable bowel wall or mesenteric thickening. Terminal ileum appears normal. There is mild lipomatous infiltration of the ileocecal valve. There is no evident bowel obstruction. No free air or portal venous air.  Vascular/Lymphatic: No abdominal aortic aneurysm. There is mild calcification in the distal aorta. No  adenopathy is evident in the abdomen or pelvis.  Reproductive: Uterus is absent.  There is no evident pelvic mass.  Other: The appendix appears normal. No evident abscess or ascites in the abdomen or pelvis. There is a small amount of fat in the umbilicus.  Musculoskeletal: Postoperative changes noted at L5 and S1. 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 There is surgical fixation through this area. No blastic or lytic bone lesions evident. No intramuscular lesions are apparent.  IMPRESSION: 1. 7 x 6 mm calculus in the proximal right ureter slightly beyond the right ureteropelvic junction. Mild hydronephrosis on the right.  2. 6 x 6 mm nonobstructing calculus in the lower pole of the right  kidney.  3. No bowel obstruction. No abscess in the abdomen or pelvis. Appendix appears normal.  4.  Gallbladder and uterus absent.  5. Postoperative change at L5 and S1. Surgical fixation through an area of spondylolisthesis at L5-S1 with apparent pars defects at L5 bilaterally.   Electronically Signed   By: Lowella Grip III M.D.   On: 03/28/2019 13:14     Assessment & Plan:    1. Nephrolithiasis --We discussed the management of kidney stones. These options include observation, ureteroscopy, shockwave lithotripsy (ESWL) and percutaneous nephrolithotomy (PCNL). We discussed which options are relevant to the patient's stone(s). We discussed the natural history of kidney stones as well as the complications of untreated stones and the impact on quality of life without treatment as well as with each of the above listed treatments. We also discussed the efficacy of each treatment in its ability to clear the stone burden. With any of these management options I discussed the signs and symptoms of infection and the need for emergent treatment should these be experienced. For each option we discussed the ability of each procedure to clear the patient of their  stone burden.   For observation I described the risks which include but are not limited to silent renal damage, life-threatening infection, need for emergent surgery, failure to pass stone and pain.   For ureteroscopy I described the risks which include bleeding, infection, damage to contiguous structures, positioning injury, ureteral stricture, ureteral avulsion, ureteral injury, need for prolonged ureteral stent, inability to perform ureteroscopy, need for an interval procedure, inability to clear stone burden, stent discomfort/pain, heart attack, stroke, pulmonary embolus and the inherent risks with general anesthesia.   For shockwave lithotripsy I described the risks which include arrhythmia, kidney contusion, kidney hemorrhage, need for transfusion, pain, inability to adequately break up stone, inability to pass stone fragments, Steinstrasse, infection associated with obstructing stones, need for alternate surgical procedure, need for repeat shockwave lithotripsy, MI, CVA, PE and the inherent risks with anesthesia/conscious sedation.   For PCNL I described the risks including positioning injury, pneumothorax, hydrothorax, need for chest tube, inability to clear stone burden, renal laceration, arterial venous fistula or malformation, need for embolization of kidney, loss of kidney or renal function, need for repeat procedure, need for prolonged nephrostomy tube, ureteral avulsion, MI, CVA, PE and the inherent risks of general anesthesia.   - The patient would like to proceed with MET -rx for flomax, percocet, and mirabegron given - POCT urinalysis dipstick - CT RENAL STONE STUDY; Future   No follow-ups on file.  Nicolette Bang, MD  [facility] [place]

## 2019-06-04 NOTE — H&P (View-Only) (Signed)
06/04/2019 11:50 AM   Teresa Vincent Mar 22, 1980 [id number]  Referring provider: Mikey Kirschner, [place],  [place] [address]  Urinary urgency  HPI: Teresa Vincent is a 39yo here for followup for nephrolithiasis and new onset urinary urgency. She underwent ESWL 1/21 and passed numerous pices. Over the past 10 days she has noted worsening right flank pain with associated urinary urgency, frequency and gross hematuria. CT scan from today shows a new right distal ureteral calculus. No fevers/chills/sweats. She has no urinary urgency frequency and urge incontinence   PMH: Past Medical History:  Diagnosis Date  . Abnormal Pap smear of cervix   . ANXIETY 08/30/2007  . Asthma   . Chronic back pain   . Complication of anesthesia    hx of waking up during anesthesia  . GERD (gastroesophageal reflux disease)   . History of kidney stones   . Hypertension   . 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 INSOMNIA 08/30/2007  . MIGRAINE HEADACHE 08/30/2007  . Neck pain   . Palpitations    occasionally couple times a week  . Sciatica   . Sleep apnea    cpap  . Stroke [facility])     Surgical History: Past Surgical History:  Procedure Laterality Date  . ABDOMINAL EXPOSURE N/A 05/29/2017   Procedure: ABDOMINAL EXPOSURE;  Surgeon: Angelia Mould, MD;  Location: [facility] OR;  Service: Vascular;  Laterality: N/A;  . ABDOMINAL HYSTERECTOMY    . ANTERIOR LUMBAR FUSION N/A 05/29/2017   Procedure: Anterior Lumbar Interbody Fusion  - Lumbar five sacral one;  Surgeon: Eustace Moore, MD;  Location: [facility];  Service: Neurosurgery;  Laterality: N/A;  . CARPAL TUNNEL RELEASE Bilateral   . CHOLECYSTECTOMY    . CHOLECYSTECTOMY N/A 12/29/2016   Procedure: LAPAROSCOPIC CHOLECYSTECTOMY;  Surgeon: Virl Cagey, MD;  Location: [facility];  Service: General;  Laterality: N/A;  . EXTRACORPOREAL SHOCK WAVE LITHOTRIPSY Right 04/10/2019   Procedure: EXTRACORPOREAL SHOCK WAVE  LITHOTRIPSY (ESWL);  Surgeon: Robley Fries, MD;  Location: [facility];  Service: Urology;  Laterality: Right;  . LAMINECTOMY WITH POSTERIOR LATERAL ARTHRODESIS LEVEL 1 N/A 02/28/2018   Procedure: Laminectomy and Foraminotomy Lumbar Five-Sacral One, posterolateral fusion and fixation;  Surgeon: Eustace Moore, MD;  Location: [facility];  Service: Neurosurgery;  Laterality: N/A;  posterior  . LUMBAR EPIDURAL INJECTION    . STERIOD INJECTION N/A 01/10/2017   Procedure: MINOR EXPAREL INJECTION;  Surgeon: Virl Cagey, MD;  Location: [facility];  Service: General;  Laterality: N/A;  . TOTAL ABDOMINAL HYSTERECTOMY W/ BILATERAL SALPINGOOPHORECTOMY N/A   . TUBAL LIGATION  2003    Home Medications:  Allergies as of 06/04/2019      Reactions   Adhesive [tape] Other (See Comments)   Skin irritation, patient prefers paper tape.    Celexa [citalopram Hydrobromide] Other (See Comments)   Make patient too carefree      Medication List       Accurate as of June 04, 2019 11:50 AM. If you have any questions, ask your nurse or doctor.        ALPRAZolam 0.5 MG tablet Commonly known as: XANAX TAKE 1 TABLET BY MOUTH AT BEDTIME AS NEEDED FOR SLEEP   atorvastatin 20 MG tablet Commonly known as: LIPITOR Take 1 tablet by mouth once daily   cholestyramine 4 GM/DOSE powder Commonly known as: Questran One scoop BID prn diarrhea   cyclobenzaprine 10 MG tablet Commonly known as: FLEXERIL Take  1 tablet (10 mg total) by mouth 3 (three) times daily as needed. for muscle spams   Euthyrox 175 MCG tablet Generic drug: levothyroxine TAKE 1 TABLET BY MOUTH ONCE DAILY BEFORE BREAKFAST   lisinopril 10 MG tablet Commonly known as: ZESTRIL Take 1 tablet (10 mg total) by mouth daily.   metoprolol tartrate 50 MG tablet Commonly known as: LOPRESSOR SMARTSIG:.5 Tablet(s) By Mouth Twice Daily   ondansetron 4 MG disintegrating tablet Commonly known as: ZOFRAN-ODT DISSOLVE 1 TABLET IN MOUTH  EVERY 8 HOURS AS NEEDED FOR NAUSEA AND VOMITING   oxyCODONE-acetaminophen 5-325 MG tablet Commonly known as: PERCOCET/ROXICET Take 1 tablet by mouth every 4 (four) hours as needed for moderate pain.   pantoprazole 40 MG tablet Commonly known as: PROTONIX TAKE 1 TABLET BY MOUTH ONCE DAILY 30  MINUTES  PRIOR  TO  A  MEAL   tamsulosin 0.4 MG Caps capsule Commonly known as: FLOMAX Take 1 capsule (0.4 mg total) by mouth daily after supper.       Allergies:  Allergies  Allergen Reactions  . Adhesive [Tape] Other (See Comments)    Skin irritation, patient prefers paper tape.   Sheliah Hatch [Citalopram Hydrobromide] Other (See Comments)    Make patient too carefree    Family History: Family History  Problem Relation Age of Onset  . Hypertension Mother   . Thyroid disease Neg Hx     Social History:  reports that she has been smoking cigarettes. She has a 26.00 pack-year smoking history. She has quit using smokeless tobacco. She reports current alcohol use. She reports current drug use. Drug: Marijuana.  ROS: All other review of systems were reviewed and are negative except what is noted above in HPI  Physical Exam: BP (!) 143/81   Pulse 76   Temp (!) 96.1 F (35.6 C)   LMP 02/25/2016   Constitutional:  Alert and oriented, No acute distress. HEENT: [place] AT, moist mucus membranes.  Trachea midline, no masses. Cardiovascular: No clubbing, cyanosis, or edema. Respiratory: Normal respiratory effort, no increased work of breathing. GI: Abdomen is soft, nontender, nondistended, no abdominal masses GU: No CVA tenderness Lymph: No cervical or inguinal lymphadenopathy. Skin: No rashes, bruises or suspicious lesions. Neurologic: Grossly intact, no focal deficits, moving all 4 extremities. Psychiatric: Normal mood and affect.  Laboratory Data: Lab Results  Component Value Date   WBC 7.3 03/28/2019   HGB 14.4 03/28/2019   HCT 44.1 03/28/2019   MCV 94.0 03/28/2019   PLT 276  03/28/2019    Lab Results  Component Value Date   CREATININE 0.73 03/28/2019    No results found for: PSA  No results found for: TESTOSTERONE  Lab Results  Component Value Date   HGBA1C 5.8 (H) 05/27/2018    Urinalysis    Component Value Date/Time   COLORURINE YELLOW 03/28/2019 1144   APPEARANCEUR CLOUDY (A) 03/28/2019 1144   LABSPEC 1.020 03/28/2019 1144   PHURINE 7.0 03/28/2019 1144   GLUCOSEU NEGATIVE 03/28/2019 1144   HGBUR LARGE (A) 03/28/2019 1144   BILIRUBINUR negative 06/04/2019 1102   KETONESUR NEGATIVE 03/28/2019 1144   PROTEINUR Positive (A) 06/04/2019 1102   PROTEINUR 30 (A) 03/28/2019 1144   UROBILINOGEN 0.2 06/04/2019 1102   UROBILINOGEN 0.2 06/18/2014 2205   NITRITE negative 06/04/2019 1102   NITRITE NEGATIVE 03/28/2019 1144   LEUKOCYTESUR Negative 06/04/2019 1102   LEUKOCYTESUR NEGATIVE 03/28/2019 1144    Lab Results  Component Value Date   BACTERIA RARE (A) 03/28/2019    Pertinent  Imaging: CT stone study: Images reviewed and discussed with the patient Results for orders placed during the hospital encounter of 04/23/19  Abdomen 1 view (KUB)   Narrative CLINICAL DATA:  Nephrolithiasis, right-sided lithotripsy 1/21  EXAM: ABDOMEN - 1 VIEW  COMPARISON:  04/10/2019  FINDINGS: Two supine frontal views of the abdomen and pelvis are obtained, excluding the hemidiaphragms in left flank by collimation. The right-sided renal calculus seen on previous study is no longer identified. There is a faint 3 mm calcification along the right lateral margin of the sacrum which could reflect fragmented calculus within the distal right ureter. No other urinary tract calculi. Bowel gas pattern is unremarkable.  IMPRESSION: 1. Likely 3 mm calculus fragment within the distal right ureter after interval lithotripsy.   Electronically Signed   By: Randa Ngo M.D.   On: 04/23/2019 13:52    No results found for this or any previous visit. No results  found for this or any previous visit. No results found for this or any previous visit. No results found for this or any previous visit. No results found for this or any previous visit. No results found for this or any previous visit. Results for orders placed during the hospital encounter of 03/28/19  CT Renal Stone Study   Narrative CLINICAL DATA:  Right-sided flank pain  EXAM: CT ABDOMEN AND PELVIS WITHOUT CONTRAST  TECHNIQUE: Multidetector CT imaging of the abdomen and pelvis was performed following the standard protocol without oral or IV contrast.  COMPARISON:  January 02, 2017  FINDINGS: Lower chest: There is mild bibasilar atelectatic change.  Hepatobiliary: There is a 5 mm probable cyst in the right lobe of the liver adjacent to the fissure for the ligamentum teres. No other liver lesion evident on this noncontrast enhanced study. Gallbladder is absent. There is no appreciable biliary duct dilatation.  Pancreas: There is no apparent pancreatic mass or inflammatory focus.  Spleen: No splenic lesions are evident.  Adrenals/Urinary Tract: Adrenals bilaterally appear normal. There is a cyst arising from the lateral mid right kidney measuring 2.3 x 2.3 cm. There is mild hydronephrosis on the right. There is no hydronephrosis on the left. There is a calculus in the lower pole of the right kidney measuring 6 x 6 mm. There is a calculus measuring 7 x 6 mm in the proximal right ureter just beyond the ureteropelvic junction. No other ureteral calculi evident. Urinary bladder is midline with wall thickness within normal limits.  Stomach/Bowel: There is no appreciable bowel wall or mesenteric thickening. Terminal ileum appears normal. There is mild lipomatous infiltration of the ileocecal valve. There is no evident bowel obstruction. No free air or portal venous air.  Vascular/Lymphatic: No abdominal aortic aneurysm. There is mild calcification in the distal aorta. No  adenopathy is evident in the abdomen or pelvis.  Reproductive: Uterus is absent.  There is no evident pelvic mass.  Other: The appendix appears normal. No evident abscess or ascites in the abdomen or pelvis. There is a small amount of fat in the umbilicus.  Musculoskeletal: Postoperative changes noted at L5 and S1. 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 There is surgical fixation through this area. No blastic or lytic bone lesions evident. No intramuscular lesions are apparent.  IMPRESSION: 1. 7 x 6 mm calculus in the proximal right ureter slightly beyond the right ureteropelvic junction. Mild hydronephrosis on the right.  2. 6 x 6 mm nonobstructing calculus in the lower pole of the right  kidney.  3. No bowel obstruction. No abscess in the abdomen or pelvis. Appendix appears normal.  4.  Gallbladder and uterus absent.  5. Postoperative change at L5 and S1. Surgical fixation through an area of spondylolisthesis at L5-S1 with apparent pars defects at L5 bilaterally.   Electronically Signed   By: Lowella Grip III M.D.   On: 03/28/2019 13:14     Assessment & Plan:    1. Nephrolithiasis --We discussed the management of kidney stones. These options include observation, ureteroscopy, shockwave lithotripsy (ESWL) and percutaneous nephrolithotomy (PCNL). We discussed which options are relevant to the patient's stone(s). We discussed the natural history of kidney stones as well as the complications of untreated stones and the impact on quality of life without treatment as well as with each of the above listed treatments. We also discussed the efficacy of each treatment in its ability to clear the stone burden. With any of these management options I discussed the signs and symptoms of infection and the need for emergent treatment should these be experienced. For each option we discussed the ability of each procedure to clear the patient of their  stone burden.   For observation I described the risks which include but are not limited to silent renal damage, life-threatening infection, need for emergent surgery, failure to pass stone and pain.   For ureteroscopy I described the risks which include bleeding, infection, damage to contiguous structures, positioning injury, ureteral stricture, ureteral avulsion, ureteral injury, need for prolonged ureteral stent, inability to perform ureteroscopy, need for an interval procedure, inability to clear stone burden, stent discomfort/pain, heart attack, stroke, pulmonary embolus and the inherent risks with general anesthesia.   For shockwave lithotripsy I described the risks which include arrhythmia, kidney contusion, kidney hemorrhage, need for transfusion, pain, inability to adequately break up stone, inability to pass stone fragments, Steinstrasse, infection associated with obstructing stones, need for alternate surgical procedure, need for repeat shockwave lithotripsy, MI, CVA, PE and the inherent risks with anesthesia/conscious sedation.   For PCNL I described the risks including positioning injury, pneumothorax, hydrothorax, need for chest tube, inability to clear stone burden, renal laceration, arterial venous fistula or malformation, need for embolization of kidney, loss of kidney or renal function, need for repeat procedure, need for prolonged nephrostomy tube, ureteral avulsion, MI, CVA, PE and the inherent risks of general anesthesia.   - The patient would like to proceed with MET -rx for flomax, percocet, and mirabegron given - POCT urinalysis dipstick - CT RENAL STONE STUDY; Future   No follow-ups on file.  Nicolette Bang, MD  [facility] [place]

## 2019-06-05 ENCOUNTER — Other Ambulatory Visit: Payer: Self-pay

## 2019-06-05 ENCOUNTER — Encounter (HOSPITAL_COMMUNITY): Payer: Self-pay | Admitting: *Deleted

## 2019-06-05 ENCOUNTER — Emergency Department (HOSPITAL_COMMUNITY)
Admission: EM | Admit: 2019-06-05 | Discharge: 2019-06-05 | Disposition: A | Discharge status: Home / Self Care | Payer: Medicaid Other | Attending: Emergency Medicine | Admitting: Emergency Medicine

## 2019-06-05 DIAGNOSIS — E039 Hypothyroidism, unspecified: Secondary | ICD-10-CM | POA: Insufficient documentation

## 2019-06-05 DIAGNOSIS — J45909 Unspecified asthma, uncomplicated: Secondary | ICD-10-CM | POA: Insufficient documentation

## 2019-06-05 DIAGNOSIS — N200 Calculus of kidney: Secondary | ICD-10-CM | POA: Insufficient documentation

## 2019-06-05 DIAGNOSIS — Z79899 Other long term (current) drug therapy: Secondary | ICD-10-CM | POA: Insufficient documentation

## 2019-06-05 DIAGNOSIS — F17210 Nicotine dependence, cigarettes, uncomplicated: Secondary | ICD-10-CM | POA: Insufficient documentation

## 2019-06-05 DIAGNOSIS — I10 Essential (primary) hypertension: Secondary | ICD-10-CM | POA: Insufficient documentation

## 2019-06-05 LAB — CBC WITH DIFFERENTIAL/PLATELET
Abs Immature Granulocytes: 0.02 10*3/uL (ref 0.00–0.07)
Basophils Absolute: 0 10*3/uL (ref 0.0–0.1)
Basophils Relative: 1 %
Eosinophils Absolute: 0.2 10*3/uL (ref 0.0–0.5)
Eosinophils Relative: 2 %
HCT: 41.1 % (ref 36.0–46.0)
Hemoglobin: 13.8 g/dL (ref 12.0–15.0)
Immature Granulocytes: 0 %
Lymphocytes Relative: 33 %
Lymphs Abs: 2.6 10*3/uL (ref 0.7–4.0)
MCH: 30.9 pg (ref 26.0–34.0)
MCHC: 33.6 g/dL (ref 30.0–36.0)
MCV: 91.9 fL (ref 80.0–100.0)
Monocytes Absolute: 0.6 10*3/uL (ref 0.1–1.0)
Monocytes Relative: 8 %
Neutro Abs: 4.6 10*3/uL (ref 1.7–7.7)
Neutrophils Relative %: 56 %
Platelets: 263 10*3/uL (ref 150–400)
RBC: 4.47 MIL/uL (ref 3.87–5.11)
RDW: 12.3 % (ref 11.5–15.5)
WBC: 8 10*3/uL (ref 4.0–10.5)
nRBC: 0 % (ref 0.0–0.2)

## 2019-06-05 LAB — BASIC METABOLIC PANEL
Anion gap: 9 (ref 5–15)
BUN: 14 mg/dL (ref 6–20)
CO2: 24 mmol/L (ref 22–32)
Calcium: 9 mg/dL (ref 8.9–10.3)
Chloride: 104 mmol/L (ref 98–111)
Creatinine, Ser: 0.82 mg/dL (ref 0.44–1.00)
GFR calc Af Amer: >60 mL/min (ref >60)
GFR calc non Af Amer: >60 mL/min (ref >60)
Glucose, Bld: 144 mg/dL — ABNORMAL HIGH (ref 70–99)
Potassium: 4 mmol/L (ref 3.5–5.1)
Sodium: 137 mmol/L (ref 135–145)

## 2019-06-05 LAB — URINALYSIS, ROUTINE W REFLEX MICROSCOPIC
Bilirubin Urine: NEGATIVE
Glucose, UA: NEGATIVE mg/dL
Ketones, ur: 5 mg/dL — AB
Leukocytes,Ua: NEGATIVE
Nitrite: NEGATIVE
Protein, ur: 100 mg/dL — AB
RBC / HPF: >50 RBC/hpf — ABNORMAL HIGH (ref 0–5)
Specific Gravity, Urine: 1.026 (ref 1.005–1.030)
pH: 5 (ref 5.0–8.0)

## 2019-06-05 MED ORDER — ONDANSETRON HCL 4 MG/2ML IJ SOLN
4.0000 mg | Freq: Once | INTRAMUSCULAR | Status: AC
  Administered 2019-06-05: 4 mg via INTRAVENOUS
  Filled 2019-06-05: qty 2

## 2019-06-05 MED ORDER — SODIUM CHLORIDE 0.9 % IV BOLUS
1000.0000 mL | Freq: Once | INTRAVENOUS | Status: AC
  Administered 2019-06-05: 1000 mL via INTRAVENOUS

## 2019-06-05 MED ORDER — MORPHINE SULFATE (PF) 4 MG/ML IV SOLN
4.0000 mg | Freq: Once | INTRAVENOUS | Status: AC
  Administered 2019-06-05: 4 mg via INTRAVENOUS
  Filled 2019-06-05: qty 1

## 2019-06-05 MED ORDER — KETOROLAC TROMETHAMINE 30 MG/ML IJ SOLN
30.0000 mg | Freq: Once | INTRAMUSCULAR | Status: AC
  Administered 2019-06-05: 30 mg via INTRAVENOUS
  Filled 2019-06-05: qty 1

## 2019-06-05 MED ORDER — HYDROMORPHONE HCL 1 MG/ML IJ SOLN
1.0000 mg | Freq: Once | INTRAMUSCULAR | Status: AC
  Administered 2019-06-05: 1 mg via INTRAVENOUS
  Filled 2019-06-05: qty 1

## 2019-06-05 NOTE — ED Triage Notes (Signed)
Patient returns to the ED for increased right flank pain.  Patient had been seen by her urologist yesterday and had been sent to this ED for evaluation.  Patient has been vomiting since early this morning.

## 2019-06-05 NOTE — Discharge Instructions (Addendum)
Follow-up with your urologist.  

## 2019-06-05 NOTE — ED Provider Notes (Addendum)
[facility] EMERGENCY DEPARTMENT Provider Note   CSN: [id number] Arrival date & time: 06/05/19  9798     History Chief Complaint  Patient presents with  . Flank Pain    right    Teresa Vincent is a 39 y.o. female.  Chief complaint right flank pain and vomiting.  No fever or chills.  Known history of kidney stones on the right.  Seen by urologist yesterday.  Severity of pain is moderate to severe.  Palpation to flank makes pain worse.        Past Medical History:  Diagnosis Date  . Abnormal Pap smear of cervix   . ANXIETY 08/30/2007  . Asthma   . Chronic back pain   . Complication of anesthesia    hx of waking up during anesthesia  . GERD (gastroesophageal reflux disease)   . History of kidney stones   . Hypertension   . 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 INSOMNIA 08/30/2007  . MIGRAINE HEADACHE 08/30/2007  . Neck pain   . Palpitations    occasionally couple times a week  . Sciatica   . Sleep apnea    cpap  . Stroke [facility])     Patient Active Problem List   Diagnosis Date Noted  . Nephrolithiasis 04/23/2019  . S/P lumbar fusion 02/28/2018  . Prediabetes 12/04/2017  . Current smoker 12/04/2017  . S/P lumbar spinal fusion 05/29/2017  . Rectal bleeding 02/01/2017  . Post-operative pain 12/30/2016  . Calculus of gallbladder without cholecystitis without obstruction   . Mixed hyperlipidemia 10/11/2016  . 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/10/2015  . Class 2 severe obesity due to excess calories with serious comorbidity and body mass index (BMI) of 36.0 to 36.9 in adult [facility]) 06/10/2015  . Left-sided low back pain with left-sided sciatica 05/03/2015  . Essential hypertension, benign 12/30/2012  . Esophageal reflux 12/30/2012  . 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 ANXIETY 08/30/2007  . MIGRAINE HEADACHE 08/30/2007  . Insomnia 08/30/2007    Past Surgical History:  Procedure Laterality Date    . ABDOMINAL EXPOSURE N/A 05/29/2017   Procedure: ABDOMINAL EXPOSURE;  Surgeon: Chuck Hint, MD;  Location: [facility] OR;  Service: Vascular;  Laterality: N/A;  . ABDOMINAL HYSTERECTOMY    . ANTERIOR LUMBAR FUSION N/A 05/29/2017   Procedure: Anterior Lumbar Interbody Fusion  - Lumbar five sacral one;  Surgeon: Tia Alert, MD;  Location: [facility] OR;  Service: Neurosurgery;  Laterality: N/A;  . CARPAL TUNNEL RELEASE Bilateral   . CHOLECYSTECTOMY    . CHOLECYSTECTOMY N/A 12/29/2016   Procedure: LAPAROSCOPIC CHOLECYSTECTOMY;  Surgeon: [name], MD;  Location: [facility] ORS;  Service: General;  Laterality: N/A;  . EXTRACORPOREAL SHOCK WAVE LITHOTRIPSY Right 04/10/2019   Procedure: EXTRACORPOREAL SHOCK WAVE LITHOTRIPSY (ESWL);  Surgeon: Noel Christmas, MD;  Location: [facility];  Service: Urology;  Laterality: Right;  . LAMINECTOMY WITH POSTERIOR LATERAL ARTHRODESIS LEVEL 1 N/A 02/28/2018   Procedure: Laminectomy and Foraminotomy Lumbar Five-Sacral One, posterolateral fusion and fixation;  Surgeon: Tia Alert, MD;  Location: [facility] OR;  Service: Neurosurgery;  Laterality: N/A;  posterior  . LUMBAR EPIDURAL INJECTION    . STERIOD INJECTION N/A 01/10/2017   Procedure: MINOR EXPAREL INJECTION;  Surgeon: [name], MD;  Location: [facility] ORS;  Service: General;  Laterality: N/A;  . TOTAL ABDOMINAL HYSTERECTOMY W/ BILATERAL SALPINGOOPHORECTOMY N/A   . TUBAL LIGATION  2003     OB History  Gravida  4   Para  1   Term  1   Preterm      AB  3   Living  1     SAB      TAB      Ectopic      Multiple      Live Births              Family History  Problem Relation Age of Onset  . Hypertension Mother   . Thyroid disease Neg Hx     Social History   Tobacco Use  . Smoking status: Current Every Day Smoker    Packs/day: 1.00    Years: 26.00    Pack years: 26.00    Types: Cigarettes  . Smokeless tobacco: Former Neurosurgeon  . Tobacco comment: currently 1  pack or less  Substance Use Topics  . Alcohol use: Yes    Alcohol/week: 0.0 standard drinks    Comment: occasional  . Drug use: Yes    Types: Marijuana    Home Medications Prior to Admission medications   Medication Sig Start Date End Date Taking? Authorizing Provider  ALPRAZolam Prudy Feeler) 0.5 MG tablet TAKE 1 TABLET BY MOUTH AT BEDTIME AS NEEDED FOR SLEEP 01/07/19  Yes [name], MD  atorvastatin (LIPITOR) 20 MG tablet Take 1 tablet by mouth once daily 02/11/19  Yes Nida, Denman George, MD  EUTHYROX 175 MCG tablet TAKE 1 TABLET BY MOUTH ONCE DAILY BEFORE BREAKFAST 02/17/19  Yes Nida, Denman George, MD  lisinopril (ZESTRIL) 10 MG tablet Take 1 tablet (10 mg total) by mouth daily. 01/07/19  Yes Roma Kayser, MD  metoprolol tartrate (LOPRESSOR) 50 MG tablet SMARTSIG:.5 Tablet(s) By Mouth Twice Daily 02/14/19  Yes [provider]  ondansetron (ZOFRAN-ODT) 4 MG disintegrating tablet DISSOLVE 1 TABLET IN MOUTH EVERY 8 HOURS AS NEEDED FOR NAUSEA AND VOMITING 06/02/19  Yes [name], MD  oxyCODONE-acetaminophen (PERCOCET/ROXICET) 5-325 MG tablet Take 1 tablet by mouth every 4 (four) hours as needed for moderate pain. 06/04/19  Yes McKenzie, Mardene Celeste, MD  pantoprazole (PROTONIX) 40 MG tablet TAKE 1 TABLET BY MOUTH ONCE DAILY 30  MINUTES  PRIOR  TO  A  MEAL 05/12/19  Yes [name], MD  cholestyramine Lanetta Inch) 4 GM/DOSE powder One scoop BID prn diarrhea Patient not taking: Reported on 06/05/2019 12/03/18   [name], MD  cyclobenzaprine (FLEXERIL) 10 MG tablet Take 1 tablet (10 mg total) by mouth 3 (three) times daily as needed. for muscle spams Patient not taking: Reported on 06/05/2019 02/28/18   Tia Alert, MD  tamsulosin (FLOMAX) 0.4 MG CAPS capsule Take 1 capsule (0.4 mg total) by mouth daily after supper. Patient not taking: Reported on 06/04/2019 04/09/19   Malen Gauze, MD    Allergies    Adhesive [tape] and Celexa [citalopram  hydrobromide]  Review of Systems   Review of Systems  All other systems reviewed and are negative.   Physical Exam Updated Vital Signs BP (!) 108/57   Pulse 75   Temp 97.9 F (36.6 C) (Oral)   Resp (!) 21   Ht 5\' 7"  (1.702 m)   Wt 104.3 kg   LMP 02/25/2016   SpO2 97%   BMI 36.02 kg/m   Physical Exam Vitals and nursing note reviewed.  Constitutional:      Appearance: She is well-developed.  HENT:     Head: Normocephalic and atraumatic.  Eyes:     Conjunctiva/sclera:  Conjunctivae normal.  Cardiovascular:     Rate and Rhythm: Normal rate and regular rhythm.  Pulmonary:     Effort: Pulmonary effort is normal.     Breath sounds: Normal breath sounds.  Abdominal:     General: Bowel sounds are normal.     Palpations: Abdomen is soft.  Genitourinary:    Comments: Tender right flank. Musculoskeletal:        General: Normal range of motion.     Cervical back: Neck supple.  Skin:    General: Skin is warm and dry.  Neurological:     General: No focal deficit present.     Mental Status: She is alert and oriented to person, place, and time.  Psychiatric:        Behavior: Behavior normal.     ED Results / Procedures / Treatments   Labs (all labs ordered are listed, but only abnormal results are displayed) Labs Reviewed  BASIC METABOLIC PANEL - Abnormal; Notable for the following components:      Result Value   Glucose, Bld 144 (*)    All other components within normal limits  URINALYSIS, ROUTINE W REFLEX MICROSCOPIC - Abnormal; Notable for the following components:   APPearance CLOUDY (*)    Hgb urine dipstick LARGE (*)    Ketones, ur 5 (*)    Protein, ur 100 (*)    RBC / HPF >50 (*)    Bacteria, UA RARE (*)    All other components within normal limits  CBC WITH DIFFERENTIAL/PLATELET    EKG None  Radiology Abdomen 1 view (KUB)  Result Date: 06/04/2019 CLINICAL DATA:  Nephrolithiasis EXAM: ABDOMEN - 1 VIEW COMPARISON:  04/23/2019 FINDINGS: Prior  cholecystectomy and postoperative changes in the lower lumbar spine. There is a non obstructive bowel gas pattern. No supine evidence of free air. No organomegaly or suspicious calcification. No acute bony abnormality. IMPRESSION: No acute findings. Electronically Signed   By: Charlett Nose M.D.   On: 06/04/2019 14:10   CT RENAL STONE STUDY  Result Date: 06/04/2019 CLINICAL DATA:  . RIGHT flank pain. Pain for 2 weeks. Feels like urinary tract infection. History of stones. EXAM: CT ABDOMEN AND PELVIS WITHOUT CONTRAST TECHNIQUE: Multidetector CT imaging of the abdomen and pelvis was performed following the standard protocol without IV contrast. COMPARISON:  None. FINDINGS: Lower chest: Lung bases are clear. Hepatobiliary: No focal hepatic lesion. Postcholecystectomy. No biliary dilatation. Pancreas: Pancreas is normal. No ductal dilatation. No pancreatic inflammation. Spleen: Normal spleen Adrenals/urinary tract: Adrenal glands normal. Small nonobstructing 1 mm calculus in the mid RIGHT kidney. No proximal hydroureter. Just distal to the RIGHT ovary, there is a calculus measuring 2 mm (image 63/2) which is new from CT 03/28/2019. Findings consistent with a RIGHT ureteral calculus. Calculus approximately 4 cm from the vascular medial junction No bladder calculi. No LEFT renal calculi or ureterolithiasis. Stomach/Bowel: Stomach, small bowel, appendix, and cecum are normal. The colon and rectosigmoid colon are normal. Vascular/Lymphatic: Abdominal aorta is normal caliber with atherosclerotic calcification. There is no retroperitoneal or periportal lymphadenopathy. No pelvic lymphadenopathy. Reproductive: Post hysterectomy Other: No free fluid. Musculoskeletal: No aggressive osseous lesion. IMPRESSION: 1. New distal RIGHT ureteral calculus with minimal obstruction. Calculus approximately 4 cm from the RIGHT vascular junction. 2. Tiny nonobstructing RIGHT renal calculus. 3. No LEFT nephrolithiasis. Electronically Signed    By: Genevive Bi M.D.   On: 06/04/2019 16:01    Procedures Procedures (including critical care time)  Medications Ordered in ED Medications  ondansetron (  ZOFRAN) injection 4 mg (4 mg Intravenous Given 06/05/19 0825)  sodium chloride 0.9 % bolus 1,000 mL (0 mLs Intravenous Stopped 06/05/19 0943)  morphine 4 MG/ML injection 4 mg (4 mg Intravenous Given 06/05/19 0826)  ketorolac (TORADOL) 30 MG/ML injection 30 mg (30 mg Intravenous Given 06/05/19 0827)  HYDROmorphone (DILAUDID) injection 1 mg (1 mg Intravenous Given 06/05/19 3154)    ED Course  I have reviewed the triage vital signs and the nursing notes.  Pertinent labs & imaging results that were available during my care of the patient were reviewed by me and considered in my medical decision making (see chart for details).    MDM Rules/Calculators/A&P                      CT renal from 06/04/2019 reviewed.  2 mm distal right calculus noted.  Pain management.      1120: Adequate pain management.  Discussed treatment plan.  Will follow up with urology.  Patient has pain medication at home. Final Clinical Impression(s) / ED Diagnoses Final diagnoses:  Right kidney stone    Rx / DC Orders ED Discharge Orders    None       Nat Christen, MD 06/05/19 1002    Nat Christen, MD 06/05/19 1135

## 2019-06-09 ENCOUNTER — Encounter (HOSPITAL_COMMUNITY): Payer: Self-pay | Admitting: *Deleted

## 2019-06-09 ENCOUNTER — Other Ambulatory Visit: Payer: Self-pay

## 2019-06-09 ENCOUNTER — Emergency Department (HOSPITAL_COMMUNITY)
Admission: EM | Admit: 2019-06-09 | Discharge: 2019-06-09 | Disposition: A | Discharge status: Home / Self Care | Payer: Medicaid Other | Attending: Emergency Medicine | Admitting: Emergency Medicine

## 2019-06-09 DIAGNOSIS — E039 Hypothyroidism, unspecified: Secondary | ICD-10-CM | POA: Insufficient documentation

## 2019-06-09 DIAGNOSIS — J45909 Unspecified asthma, uncomplicated: Secondary | ICD-10-CM | POA: Insufficient documentation

## 2019-06-09 DIAGNOSIS — I10 Essential (primary) hypertension: Secondary | ICD-10-CM | POA: Insufficient documentation

## 2019-06-09 DIAGNOSIS — F17210 Nicotine dependence, cigarettes, uncomplicated: Secondary | ICD-10-CM | POA: Insufficient documentation

## 2019-06-09 DIAGNOSIS — Z79899 Other long term (current) drug therapy: Secondary | ICD-10-CM | POA: Insufficient documentation

## 2019-06-09 DIAGNOSIS — N23 Unspecified renal colic: Secondary | ICD-10-CM | POA: Insufficient documentation

## 2019-06-09 LAB — URINALYSIS, ROUTINE W REFLEX MICROSCOPIC
Bilirubin Urine: NEGATIVE
Glucose, UA: NEGATIVE mg/dL
Ketones, ur: NEGATIVE mg/dL
Leukocytes,Ua: NEGATIVE
Nitrite: NEGATIVE
Protein, ur: NEGATIVE mg/dL
Specific Gravity, Urine: 1.02 (ref 1.005–1.030)
pH: 5 (ref 5.0–8.0)

## 2019-06-09 LAB — CBC WITH DIFFERENTIAL/PLATELET
Abs Immature Granulocytes: 0.03 10*3/uL (ref 0.00–0.07)
Basophils Absolute: 0 10*3/uL (ref 0.0–0.1)
Basophils Relative: 0 %
Eosinophils Absolute: 0.2 10*3/uL (ref 0.0–0.5)
Eosinophils Relative: 2 %
HCT: 40 % (ref 36.0–46.0)
Hemoglobin: 13.1 g/dL (ref 12.0–15.0)
Immature Granulocytes: 0 %
Lymphocytes Relative: 24 %
Lymphs Abs: 2.6 10*3/uL (ref 0.7–4.0)
MCH: 30.9 pg (ref 26.0–34.0)
MCHC: 32.8 g/dL (ref 30.0–36.0)
MCV: 94.3 fL (ref 80.0–100.0)
Monocytes Absolute: 1 10*3/uL (ref 0.1–1.0)
Monocytes Relative: 9 %
Neutro Abs: 6.9 10*3/uL (ref 1.7–7.7)
Neutrophils Relative %: 65 %
Platelets: 250 10*3/uL (ref 150–400)
RBC: 4.24 MIL/uL (ref 3.87–5.11)
RDW: 12.6 % (ref 11.5–15.5)
WBC: 10.7 10*3/uL — ABNORMAL HIGH (ref 4.0–10.5)
nRBC: 0 % (ref 0.0–0.2)

## 2019-06-09 LAB — BASIC METABOLIC PANEL
Anion gap: 6 (ref 5–15)
BUN: 16 mg/dL (ref 6–20)
CO2: 25 mmol/L (ref 22–32)
Calcium: 8.8 mg/dL — ABNORMAL LOW (ref 8.9–10.3)
Chloride: 105 mmol/L (ref 98–111)
Creatinine, Ser: 0.96 mg/dL (ref 0.44–1.00)
GFR calc Af Amer: >60 mL/min (ref >60)
GFR calc non Af Amer: >60 mL/min (ref >60)
Glucose, Bld: 131 mg/dL — ABNORMAL HIGH (ref 70–99)
Potassium: 3.8 mmol/L (ref 3.5–5.1)
Sodium: 136 mmol/L (ref 135–145)

## 2019-06-09 LAB — PREGNANCY, URINE: Preg Test, Ur: NEGATIVE

## 2019-06-09 MED ORDER — KETOROLAC TROMETHAMINE 30 MG/ML IJ SOLN
30.0000 mg | Freq: Once | INTRAMUSCULAR | Status: AC
  Administered 2019-06-09: 30 mg via INTRAVENOUS
  Filled 2019-06-09: qty 1

## 2019-06-09 MED ORDER — SODIUM CHLORIDE 0.9 % IV BOLUS
1000.0000 mL | Freq: Once | INTRAVENOUS | Status: AC
  Administered 2019-06-09: 1000 mL via INTRAVENOUS

## 2019-06-09 MED ORDER — HYDROMORPHONE HCL 1 MG/ML IJ SOLN
1.0000 mg | Freq: Once | INTRAMUSCULAR | Status: AC
  Administered 2019-06-09: 1 mg via INTRAVENOUS
  Filled 2019-06-09: qty 1

## 2019-06-09 MED ORDER — OXYCODONE-ACETAMINOPHEN 5-325 MG PO TABS
2.0000 | ORAL_TABLET | Freq: Once | ORAL | Status: AC
  Administered 2019-06-09: 2 via ORAL
  Filled 2019-06-09: qty 2

## 2019-06-09 MED ORDER — ONDANSETRON HCL 4 MG/2ML IJ SOLN
4.0000 mg | Freq: Once | INTRAMUSCULAR | Status: AC
  Administered 2019-06-09: 4 mg via INTRAVENOUS
  Filled 2019-06-09: qty 2

## 2019-06-09 NOTE — ED Provider Notes (Signed)
[facility] EMERGENCY DEPARTMENT Provider Note   CSN: [id number] Arrival date & time: 06/09/19  0230     History Chief Complaint  Patient presents with  . Flank Pain    Teresa Vincent is a 39 y.o. female.  Patient here with right-sided flank pain with nausea.  Has been having ongoing issues with a kidney stone and was seen in the ED on March 18 and by her urologist on March 17.  States she was doing well until about 10 PM tonight when she had worsening of her right flank pain with nausea but no vomiting.  She took 2 oxycodone pills at home without relief.  States she has not used any pain medication since the 18th until now.  Pain feels similar to previous kidney stone.  No radiation of the pain.  Some pain with urination but no hematuria.  No fever.  No chest pain or shortness of breath.  Has required lithotripsy in the past.  The history is provided by the patient.  Flank Pain Pertinent negatives include no chest pain, no abdominal pain, no headaches and no shortness of breath.       Past Medical History:  Diagnosis Date  . Abnormal Pap smear of cervix   . ANXIETY 08/30/2007  . Asthma   . Chronic back pain   . Complication of anesthesia    hx of waking up during anesthesia  . GERD (gastroesophageal reflux disease)   . History of kidney stones   . Hypertension   . 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 INSOMNIA 08/30/2007  . MIGRAINE HEADACHE 08/30/2007  . Neck pain   . Palpitations    occasionally couple times a week  . Sciatica   . Sleep apnea    cpap  . Stroke [facility])     Patient Active Problem List   Diagnosis Date Noted  . Nephrolithiasis 04/23/2019  . S/P lumbar fusion 02/28/2018  . Prediabetes 12/04/2017  . Current smoker 12/04/2017  . S/P lumbar spinal fusion 05/29/2017  . Rectal bleeding 02/01/2017  . Post-operative pain 12/30/2016  . Calculus of gallbladder without cholecystitis without obstruction   . Mixed hyperlipidemia 10/11/2016  .  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/10/2015  . Class 2 severe obesity due to excess calories with serious comorbidity and body mass index (BMI) of 36.0 to 36.9 in adult [facility]) 06/10/2015  . Left-sided low back pain with left-sided sciatica 05/03/2015  . Essential hypertension, benign 12/30/2012  . Esophageal reflux 12/30/2012  . 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 ANXIETY 08/30/2007  . MIGRAINE HEADACHE 08/30/2007  . Insomnia 08/30/2007    Past Surgical History:  Procedure Laterality Date  . ABDOMINAL EXPOSURE N/A 05/29/2017   Procedure: ABDOMINAL EXPOSURE;  Surgeon: Chuck Hint, MD;  Location: [facility] OR;  Service: Vascular;  Laterality: N/A;  . ABDOMINAL HYSTERECTOMY    . ANTERIOR LUMBAR FUSION N/A 05/29/2017   Procedure: Anterior Lumbar Interbody Fusion  - Lumbar five sacral one;  Surgeon: Tia Alert, MD;  Location: [facility] OR;  Service: Neurosurgery;  Laterality: N/A;  . CARPAL TUNNEL RELEASE Bilateral   . CHOLECYSTECTOMY    . CHOLECYSTECTOMY N/A 12/29/2016   Procedure: LAPAROSCOPIC CHOLECYSTECTOMY;  Surgeon: [name], MD;  Location: [facility];  Service: General;  Laterality: N/A;  . EXTRACORPOREAL SHOCK WAVE LITHOTRIPSY Right 04/10/2019   Procedure: EXTRACORPOREAL SHOCK WAVE LITHOTRIPSY (ESWL);  Surgeon: Noel Christmas, MD;  Location: [facility];  Service: Urology;  Laterality: Right;  . LAMINECTOMY WITH POSTERIOR LATERAL ARTHRODESIS LEVEL 1 N/A 02/28/2018   Procedure: Laminectomy and Foraminotomy Lumbar Five-Sacral One, posterolateral fusion and fixation;  Surgeon: Tia Alert, MD;  Location: [facility] OR;  Service: Neurosurgery;  Laterality: N/A;  posterior  . LUMBAR EPIDURAL INJECTION    . STERIOD INJECTION N/A 01/10/2017   Procedure: MINOR EXPAREL INJECTION;  Surgeon: [name], MD;  Location: [facility];  Service: General;  Laterality: N/A;  . TOTAL ABDOMINAL HYSTERECTOMY W/ BILATERAL  SALPINGOOPHORECTOMY N/A   . TUBAL LIGATION  2003     OB History    Gravida  4   Para  1   Term  1   Preterm      AB  3   Living  1     SAB      TAB      Ectopic      Multiple      Live Births              Family History  Problem Relation Age of Onset  . Hypertension Mother   . Thyroid disease Neg Hx     Social History   Tobacco Use  . Smoking status: Current Every Day Smoker    Packs/day: 1.00    Years: 26.00    Pack years: 26.00    Types: Cigarettes  . Smokeless tobacco: Former Neurosurgeon  . Tobacco comment: currently 1 pack or less  Substance Use Topics  . Alcohol use: Yes    Alcohol/week: 0.0 standard drinks    Comment: occasional  . Drug use: Yes    Types: Marijuana    Home Medications Prior to Admission medications   Medication Sig Start Date End Date Taking? Authorizing Provider  ALPRAZolam Prudy Feeler) 0.5 MG tablet TAKE 1 TABLET BY MOUTH AT BEDTIME AS NEEDED FOR SLEEP 01/07/19   [name], MD  atorvastatin (LIPITOR) 20 MG tablet Take 1 tablet by mouth once daily 02/11/19   Roma Kayser, MD  cholestyramine Lanetta Inch) 4 GM/DOSE powder One scoop BID prn diarrhea Patient not taking: Reported on 06/05/2019 12/03/18   [name], MD  cyclobenzaprine (FLEXERIL) 10 MG tablet Take 1 tablet (10 mg total) by mouth 3 (three) times daily as needed. for muscle spams Patient not taking: Reported on 06/05/2019 02/28/18   Tia Alert, MD  EUTHYROX 175 MCG tablet TAKE 1 TABLET BY MOUTH ONCE DAILY BEFORE BREAKFAST 02/17/19   Roma Kayser, MD  lisinopril (ZESTRIL) 10 MG tablet Take 1 tablet (10 mg total) by mouth daily. 01/07/19   Roma Kayser, MD  metoprolol tartrate (LOPRESSOR) 50 MG tablet SMARTSIG:.5 Tablet(s) By Mouth Twice Daily 02/14/19   [provider]  ondansetron (ZOFRAN-ODT) 4 MG disintegrating tablet DISSOLVE 1 TABLET IN MOUTH EVERY 8 HOURS AS NEEDED FOR NAUSEA AND VOMITING 06/02/19   [name], MD    oxyCODONE-acetaminophen (PERCOCET/ROXICET) 5-325 MG tablet Take 1 tablet by mouth every 4 (four) hours as needed for moderate pain. 06/04/19   McKenzie, Mardene Celeste, MD  pantoprazole (PROTONIX) 40 MG tablet TAKE 1 TABLET BY MOUTH ONCE DAILY 30  MINUTES  PRIOR  TO  A  MEAL 05/12/19   [name], MD  tamsulosin (FLOMAX) 0.4 MG CAPS capsule Take 1 capsule (0.4 mg total) by mouth daily after supper. Patient not taking: Reported on 06/04/2019 04/09/19   Malen Gauze, MD    Allergies    Adhesive [tape] and Celexa [citalopram hydrobromide]  Review of Systems   Review of Systems  Constitutional: Negative for activity change, appetite change and fever.  HENT: Negative for congestion and rhinorrhea.   Eyes: Negative for visual disturbance.  Respiratory: Negative for cough, chest tightness and shortness of breath.   Cardiovascular: Negative for chest pain.  Gastrointestinal: Positive for nausea. Negative for abdominal pain and vomiting.  Genitourinary: Positive for dysuria, flank pain and urgency.  Musculoskeletal: Positive for back pain.  Neurological: Negative for dizziness, weakness and headaches.   all other systems are negative except as noted in the HPI and PMH.    Physical Exam Updated Vital Signs BP 126/84   Pulse 73   Temp 98 F (36.7 C)   Resp 20   Ht 5\' 7"  (1.702 m)   Wt 104.3 kg   LMP 02/25/2016   SpO2 100%   BMI 36.01 kg/m   Physical Exam Vitals and nursing note reviewed.  Constitutional:      General: She is not in acute distress.    Appearance: She is well-developed.     Comments: uncomfortable  HENT:     Head: Normocephalic and atraumatic.     Mouth/Throat:     Pharynx: No oropharyngeal exudate.  Eyes:     Conjunctiva/sclera: Conjunctivae normal.     Pupils: Pupils are equal, round, and reactive to light.  Neck:     Comments: No meningismus. Cardiovascular:     Rate and Rhythm: Normal rate and regular rhythm.     Heart sounds: Normal heart sounds.  No murmur.  Pulmonary:     Effort: Pulmonary effort is normal. No respiratory distress.     Breath sounds: Normal breath sounds.  Abdominal:     Palpations: Abdomen is soft.     Tenderness: There is no abdominal tenderness. There is no guarding or rebound.  Musculoskeletal:        General: Tenderness present. Normal range of motion.     Cervical back: Normal range of motion and neck supple.     Comments: R CVAT  Equal strength in lower extremities. Equal PT pulses  Skin:    General: Skin is warm.  Neurological:     Mental Status: She is alert and oriented to person, place, and time.     Cranial Nerves: No cranial nerve deficit.     Motor: No abnormal muscle tone.     Coordination: Coordination normal.     Comments: No ataxia on finger to nose bilaterally. No pronator drift. 5/5 strength throughout. CN 2-12 intact.Equal grip strength. Sensation intact.   Psychiatric:        Behavior: Behavior normal.     ED Results / Procedures / Treatments   Labs (all labs ordered are listed, but only abnormal results are displayed) Labs Reviewed  URINALYSIS, ROUTINE W REFLEX MICROSCOPIC - Abnormal; Notable for the following components:      Result Value   APPearance HAZY (*)    Hgb urine dipstick SMALL (*)    Bacteria, UA RARE (*)    All other components within normal limits  CBC WITH DIFFERENTIAL/PLATELET - Abnormal; Notable for the following components:   WBC 10.7 (*)    All other components within normal limits  BASIC METABOLIC PANEL - Abnormal; Notable for the following components:   Glucose, Bld 131 (*)    Calcium 8.8 (*)    All other components within normal limits  PREGNANCY, URINE    EKG None  Radiology No results found.  Procedures Procedures (including critical care  time)  Medications Ordered in ED Medications  HYDROmorphone (DILAUDID) injection 1 mg (has no administration in time range)  ondansetron (ZOFRAN) injection 4 mg (has no administration in time range)    ketorolac (TORADOL) 30 MG/ML injection 30 mg (has no administration in time range)  sodium chloride 0.9 % bolus 1,000 mL (has no administration in time range)    ED Course  I have reviewed the triage vital signs and the nursing notes.  Pertinent labs & imaging results that were available during my care of the patient were reviewed by me and considered in my medical decision making (see chart for details).    MDM Rules/Calculators/A&P                     Right flank pain with nausea similar to previous kidney stone.  No fever.  CT scan from March 17 reviewed.  Patient at that time had 2 mm stone approximately 4 cm from the UVJ.  UA negative for infection.  Creatinine is stable.  Patient's pain and nausea improved with treatment symptomatically in the ED.  No indication for repeat imaging.  Would not change management.  Advised patient to call her urologist in the morning to schedule close follow-up.  States she has pain medication at home.  She is also taking Flomax.  Advised that she can add ibuprofen to take around the clock and use her pain medication for breakthrough pain.  Return to the ED with worsening pain, fever, intractable nausea and vomiting, inability to urinate or other concerns. Final Clinical Impression(s) / ED Diagnoses Final diagnoses:  Renal colic on right side    Rx / DC Orders ED Discharge Orders    None       Rasheeda Mulvehill, Annie Main, MD 06/09/19 [phone]

## 2019-06-09 NOTE — ED Triage Notes (Signed)
Pt c/o right flank pain that became worse tonight, has had pain medication at home with no improvement of symptoms,

## 2019-06-09 NOTE — Discharge Instructions (Signed)
Take your pain medication as prescribed.  You may take ibuprofen every 8 hours for inflammation as well.  Follow-up with your urologist.  Return to the ED with worsening pain, intractable nausea and vomiting, fever, inability to urinate or any other concerns.

## 2019-06-11 ENCOUNTER — Telehealth: Payer: Self-pay

## 2019-06-11 ENCOUNTER — Telehealth: Payer: Self-pay | Admitting: Urology

## 2019-06-11 MED ORDER — OXYCODONE-ACETAMINOPHEN 10-325 MG PO TABS: 1.0000 | ORAL_TABLET | ORAL | 0 refills | Status: DC | PRN

## 2019-06-11 NOTE — Telephone Encounter (Signed)
-----   Message from Malen Gauze, MD sent at 06/11/2019  1:31 PM EDT ----- Regarding: RE: pain med Rx sent ----- Message ----- From: Ferdinand Lango, RN Sent: 06/11/2019  12:54 PM EDT To: Malen Gauze, MD Subject: pain med                                       Pt will need stronger pain meds as discussed. Currently has 5mg  oxycodone.

## 2019-06-11 NOTE — Telephone Encounter (Signed)
Pt called and notified

## 2019-06-11 NOTE — Telephone Encounter (Signed)
Ain medication refill

## 2019-06-11 NOTE — Telephone Encounter (Signed)
Pt called this am with concerns of increased pain. Pt reports she has been to Er twice since last ov and current pain medications are not helping with stone pain. Pt was advised by ER to contact office. Pt has appt next Wednesday with f/u. Spoke with Dr. Ronne Binning who will prescribe something stronger for pain and to keep scheduled appt. Pt voiced understanding.

## 2019-06-18 ENCOUNTER — Ambulatory Visit: Payer: Medicaid Other | Admitting: Urology

## 2019-06-18 ENCOUNTER — Other Ambulatory Visit: Payer: Self-pay

## 2019-06-18 ENCOUNTER — Ambulatory Visit (INDEPENDENT_AMBULATORY_CARE_PROVIDER_SITE_OTHER): Payer: Self-pay | Admitting: Urology

## 2019-06-18 VITALS — BP 111/70 | HR 71 | Temp 97.0°F

## 2019-06-18 DIAGNOSIS — N200 Calculus of kidney: Secondary | ICD-10-CM

## 2019-06-18 DIAGNOSIS — R300 Dysuria: Secondary | ICD-10-CM | POA: Insufficient documentation

## 2019-06-18 MED ORDER — MIRABEGRON ER 25 MG PO TB24: 25.0000 mg | ORAL_TABLET | Freq: Every day | ORAL | 0 refills | Status: DC

## 2019-06-18 NOTE — Progress Notes (Signed)
06/18/2019 10:26 AM   Teresa Vincent May 05, 1980 [id number]  Referring provider: Mikey Kirschner, MD [place],   [id number]  Dysuria  HPI: Ms Spradlin is a 39yo here for followup for nephrolithiasis. She was diagnosed with a 61m right distal ureteral calculus on 06/04/2019. She has not passed her calculus. Intermittent mild flank pain.  She has new dysuria for the past 2 weeks. She has urinary urgency and frequency. No fevers/chills/sweats. No nausea/vomiting.    PMH: Past Medical History:  Diagnosis Date  . Abnormal Pap smear of cervix   . ANXIETY 08/30/2007  . Asthma   . Chronic back pain   . Complication of anesthesia    hx of waking up during anesthesia  . GERD (gastroesophageal reflux disease)   . History of kidney stones   . Hypertension   . 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 INSOMNIA 08/30/2007  . MIGRAINE HEADACHE 08/30/2007  . Neck pain   . Palpitations    occasionally couple times a week  . Sciatica   . Sleep apnea    cpap  . Stroke ([facility]     Surgical History: Past Surgical History:  Procedure Laterality Date  . ABDOMINAL EXPOSURE N/A 05/29/2017   Procedure: ABDOMINAL EXPOSURE;  Surgeon: DAngelia Mould MD;  [facility];  Service: Vascular;  Laterality: N/A;  . ABDOMINAL HYSTERECTOMY    . ANTERIOR LUMBAR FUSION N/A 05/29/2017   Procedure: Anterior Lumbar Interbody Fusion  - Lumbar five sacral one;  Surgeon: JEustace Moore MD;  [place] [facility]  Service: Neurosurgery;  Laterality: N/A;  . CARPAL TUNNEL RELEASE Bilateral   . CHOLECYSTECTOMY    . CHOLECYSTECTOMY N/A 12/29/2016   Procedure: LAPAROSCOPIC CHOLECYSTECTOMY;  Surgeon: BVirl Cagey MD;  Location: [facility];  Service: General;  Laterality: N/A;  . EXTRACORPOREAL SHOCK WAVE LITHOTRIPSY Right 04/10/2019   Procedure: EXTRACORPOREAL SHOCK WAVE LITHOTRIPSY (ESWL);  Surgeon: PRobley Fries MD;  [facility]  Service:  Urology;  Laterality: Right;  . LAMINECTOMY WITH POSTERIOR LATERAL ARTHRODESIS LEVEL 1 N/A 02/28/2018   Procedure: Laminectomy and Foraminotomy Lumbar Five-Sacral One, posterolateral fusion and fixation;  Surgeon: JEustace Moore MD;  [place]  Service: Neurosurgery;  Laterality: N/A;  posterior  . LUMBAR EPIDURAL INJECTION    . STERIOD INJECTION N/A 01/10/2017   Procedure: MINOR EXPAREL INJECTION;  Surgeon: BVirl Cagey MD;  Location: [facility];  Service: General;  Laterality: N/A;  . TOTAL ABDOMINAL HYSTERECTOMY W/ BILATERAL SALPINGOOPHORECTOMY N/A   . TUBAL LIGATION  2003    Home Medications:  Allergies as of 06/18/2019      Reactions   Adhesive [tape] Other (See Comments)   Skin irritation, patient prefers paper tape.    Celexa [citalopram Hydrobromide] Other (See Comments)   Make patient too carefree      Medication List       Accurate as of June 18, 2019 10:26 AM. If you have any questions, ask your nurse or doctor.        ALPRAZolam 0.5 MG tablet Commonly known as: XANAX TAKE 1 TABLET BY MOUTH AT BEDTIME AS NEEDED FOR SLEEP   atorvastatin 20 MG tablet Commonly known as: LIPITOR Take 1 tablet by mouth once daily   cholestyramine 4 GM/DOSE powder Commonly known as: Questran One scoop BID prn diarrhea   cyclobenzaprine 10 MG tablet Commonly known as: FLEXERIL Take 1 tablet (10 mg total) by mouth 3 (three) times daily as needed.  for muscle spams   Euthyrox 175 MCG tablet Generic drug: levothyroxine TAKE 1 TABLET BY MOUTH ONCE DAILY BEFORE BREAKFAST   lisinopril 10 MG tablet Commonly known as: ZESTRIL Take 1 tablet (10 mg total) by mouth daily.   metoprolol tartrate 50 MG tablet Commonly known as: LOPRESSOR SMARTSIG:.5 Tablet(s) By Mouth Twice Daily   ondansetron 4 MG disintegrating tablet Commonly known as: ZOFRAN-ODT DISSOLVE 1 TABLET IN MOUTH EVERY 8 HOURS AS NEEDED FOR NAUSEA AND VOMITING   oxyCODONE-acetaminophen 10-325 MG tablet Commonly  known as: Percocet Take 1 tablet by mouth every 4 (four) hours as needed for pain.   pantoprazole 40 MG tablet Commonly known as: PROTONIX TAKE 1 TABLET BY MOUTH ONCE DAILY 30  MINUTES  PRIOR  TO  A  MEAL   tamsulosin 0.4 MG Caps capsule Commonly known as: FLOMAX Take 1 capsule (0.4 mg total) by mouth daily after supper.       Allergies:  Allergies  Allergen Reactions  . Adhesive [Tape] Other (See Comments)    Skin irritation, patient prefers paper tape.   Sheliah Hatch [Citalopram Hydrobromide] Other (See Comments)    Make patient too carefree    Family History: Family History  Problem Relation Age of Onset  . Hypertension Mother   . Thyroid disease Neg Hx     Social History:  reports that she has been smoking cigarettes. She has a 26.00 pack-year smoking history. She has quit using smokeless tobacco. She reports current alcohol use. She reports current drug use. Drug: Marijuana.  ROS: All other review of systems were reviewed and are negative except what is noted above in HPI  Physical Exam: BP 111/70   Pulse 71   Temp (!) 97 F (36.1 C)   LMP 02/25/2016   Constitutional:  Alert and oriented, No acute distress. HEENT: [place] AT, moist mucus membranes.  Trachea midline, no masses. Cardiovascular: No clubbing, cyanosis, or edema. Respiratory: Normal respiratory effort, no increased work of breathing. GI: Abdomen is soft, nontender, nondistended, no abdominal masses GU: No CVA tenderness Lymph: No cervical or inguinal lymphadenopathy. Skin: No rashes, bruises or suspicious lesions. Neurologic: Grossly intact, no focal deficits, moving all 4 extremities. Psychiatric: Normal mood and affect.  Laboratory Data: Lab Results  Component Value Date   WBC 10.7 (H) 06/09/2019   HGB 13.1 06/09/2019   HCT 40.0 06/09/2019   MCV 94.3 06/09/2019   PLT 250 06/09/2019    Lab Results  Component Value Date   CREATININE 0.96 06/09/2019    No results found for: PSA  No results  found for: TESTOSTERONE  Lab Results  Component Value Date   HGBA1C 5.8 (H) 05/27/2018    Urinalysis    Component Value Date/Time   COLORURINE YELLOW 06/09/2019 0313   APPEARANCEUR HAZY (A) 06/09/2019 0313   LABSPEC 1.020 06/09/2019 0313   PHURINE 5.0 06/09/2019 0313   GLUCOSEU NEGATIVE 06/09/2019 0313   HGBUR SMALL (A) 06/09/2019 0313   BILIRUBINUR NEGATIVE 06/09/2019 0313   BILIRUBINUR negative 06/04/2019 1102   KETONESUR NEGATIVE 06/09/2019 0313   PROTEINUR NEGATIVE 06/09/2019 0313   UROBILINOGEN 0.2 06/04/2019 1102   UROBILINOGEN 0.2 06/18/2014 2205   NITRITE NEGATIVE 06/09/2019 0313   LEUKOCYTESUR NEGATIVE 06/09/2019 0313    Lab Results  Component Value Date   BACTERIA RARE (A) 06/09/2019    Pertinent Imaging: CT stone study: Images reviewed and discussed with patient Results for orders placed during the hospital encounter of 06/04/19  Abdomen 1 view (KUB)   Narrative  CLINICAL DATA:  Nephrolithiasis  EXAM: ABDOMEN - 1 VIEW  COMPARISON:  04/23/2019  FINDINGS: Prior cholecystectomy and postoperative changes in the lower lumbar spine. There is a non obstructive bowel gas pattern. No supine evidence of free air. No organomegaly or suspicious calcification. No acute bony abnormality.  IMPRESSION: No acute findings.   Electronically Signed   By: Rolm Baptise M.D.   On: 06/04/2019 14:10    No results found for this or any previous visit. No results found for this or any previous visit. No results found for this or any previous visit. No results found for this or any previous visit. No results found for this or any previous visit. No results found for this or any previous visit. Results for orders placed during the hospital encounter of 06/04/19  CT RENAL STONE STUDY   Narrative CLINICAL DATA:  . RIGHT flank pain. Pain for 2 weeks. Feels like urinary tract infection. History of stones.  EXAM: CT ABDOMEN AND PELVIS WITHOUT  CONTRAST  TECHNIQUE: Multidetector CT imaging of the abdomen and pelvis was performed following the standard protocol without IV contrast.  COMPARISON:  None.  FINDINGS: Lower chest: Lung bases are clear.  Hepatobiliary: No focal hepatic lesion. Postcholecystectomy. No biliary dilatation.  Pancreas: Pancreas is normal. No ductal dilatation. No pancreatic inflammation.  Spleen: Normal spleen  Adrenals/urinary tract: Adrenal glands normal. Small nonobstructing 1 mm calculus in the mid RIGHT kidney. No proximal hydroureter.  Just distal to the RIGHT ovary, there is a calculus measuring 2 mm (image 63/2) which is new from CT 03/28/2019. Findings consistent with a RIGHT ureteral calculus. Calculus approximately 4 cm from the vascular medial junction  No bladder calculi.  No LEFT renal calculi or ureterolithiasis.  Stomach/Bowel: Stomach, small bowel, appendix, and cecum are normal. The colon and rectosigmoid colon are normal.  Vascular/Lymphatic: Abdominal aorta is normal caliber with atherosclerotic calcification. There is no retroperitoneal or periportal lymphadenopathy. No pelvic lymphadenopathy.  Reproductive: Post hysterectomy  Other: No free fluid.  Musculoskeletal: No aggressive osseous lesion.  IMPRESSION: 1. New distal RIGHT ureteral calculus with minimal obstruction. Calculus approximately 4 cm from the RIGHT vascular junction. 2. Tiny nonobstructing RIGHT renal calculus. 3. No LEFT nephrolithiasis.   Electronically Signed   By: Suzy Bouchard M.D.   On: 06/04/2019 16:01     Assessment & Plan:    1. Dysuria -likely related to to distal ureteral calculus.  -refill mirabegron 55m daily  2. Nephrolithiasis -We discussed MET versus URS and the patient elects for URS   No follow-ups on file.  PNicolette Bang MD  [facility] [place]

## 2019-06-18 NOTE — Progress Notes (Signed)

## 2019-06-19 NOTE — Patient Instructions (Addendum)
Your procedure is scheduled on: 06/25/2019  Report to [facility] at    6:15 AM.  Call this number if you have problems the morning of surgery: [phone]   Remember:   Do not Eat or Drink after midnight         No Smoking the morning of surgery  :  Take these medicines the morning of surgery with A SIP OF WATER: Metoprolol, lisinopril, euthyrox and protonix   Do not wear jewelry, make-up or nail polish.  Do not wear lotions, powders, or perfumes. You may wear deodorant.  Do not shave 48 hours prior to surgery. Men may shave face and neck.  Do not bring valuables to the hospital.  Contacts, dentures or bridgework may not be worn into surgery.  Leave suitcase in the car. After surgery it may be brought to your room.  For patients admitted to the hospital, checkout time is 11:00 AM the day of discharge.   Patients discharged the day of surgery will not be allowed to drive home.    Special Instructions: Shower using CHG night before surgery and shower the day of surgery use CHG.  Use special wash - you have one bottle of CHG for all showers.  You should use approximately 1/2 of the bottle for each shower.  Cystoscopy Cystoscopy is a procedure that is used to help diagnose and sometimes treat conditions that affect the lower urinary tract. The lower urinary tract includes the bladder and the urethra. The urethra is the tube that drains urine from the bladder. Cystoscopy is done using a thin, tube-shaped instrument with a light and camera at the end (cystoscope). The cystoscope may be hard or flexible, depending on the goal of the procedure. The cystoscope is inserted through the urethra, into the bladder. Cystoscopy may be recommended if you have:  Urinary tract infections that keep coming back.  Blood in the urine (hematuria).  An inability to control when you urinate (urinary incontinence) or an overactive bladder.  Unusual cells found in a urine sample.  A blockage in the  urethra, such as a urinary stone.  Painful urination.  An abnormality in the bladder found during an intravenous pyelogram (IVP) or CT scan. Cystoscopy may also be done to remove a sample of tissue to be examined under a microscope (biopsy). Tell a health care provider about:  Any allergies you have.  All medicines you are taking, including vitamins, herbs, eye drops, creams, and over-the-counter medicines.  Any problems you or family members have had with anesthetic medicines.  Any blood disorders you have.  Any surgeries you have had.  Any medical conditions you have.  Whether you are pregnant or may be pregnant. What are the risks? Generally, this is a safe procedure. However, problems may occur, including:  Infection.  Bleeding.  Allergic reactions to medicines.  Damage to other structures or organs. What happens before the procedure?  Ask your health care provider about: ? Changing or stopping your regular medicines. This is especially important if you are taking diabetes medicines or blood thinners. ? Taking medicines such as aspirin and ibuprofen. These medicines can thin your blood. Do not take these medicines unless your health care provider tells you to take them. ? Taking over-the-counter medicines, vitamins, herbs, and supplements.  Follow instructions from your health care provider about eating or drinking restrictions.  Ask your health care provider what steps will be taken to help prevent infection. These may include: ? Washing skin with a  germ-killing soap. ? Taking antibiotic medicine.  You may have an exam or testing, such as: ? X-rays of the bladder, urethra, or kidneys. ? Urine tests to check for signs of infection.  Plan to have someone take you home from the hospital or clinic. What happens during the procedure?   You will be given one or more of the following: ? A medicine to help you relax (sedative). ? A medicine to numb the area (local  anesthetic).  The area around the opening of your urethra will be cleaned.  The cystoscope will be passed through your urethra into your bladder.  Germ-free (sterile) fluid will flow through the cystoscope to fill your bladder. The fluid will stretch your bladder so that your health care provider can clearly examine your bladder walls.  Your doctor will look at the urethra and bladder. Your doctor may take a biopsy or remove stones.  The cystoscope will be removed, and your bladder will be emptied. The procedure may vary among health care providers and hospitals. What can I expect after the procedure? After the procedure, it is common to have:  Some soreness or pain in your abdomen and urethra.  Urinary symptoms. These include: ? Mild pain or burning when you urinate. Pain should stop within a few minutes after you urinate. This may last for up to 1 week. ? A small amount of blood in your urine for several days. ? Feeling like you need to urinate but producing only a small amount of urine. Follow these instructions at home: Medicines  Take over-the-counter and prescription medicines only as told by your health care provider.  If you were prescribed an antibiotic medicine, take it as told by your health care provider. Do not stop taking the antibiotic even if you start to feel better. General instructions  Return to your normal activities as told by your health care provider. Ask your health care provider what activities are safe for you.  Do not drive for 24 hours if you were given a sedative during your procedure.  Watch for any blood in your urine. If the amount of blood in your urine increases, call your health care provider.  Follow instructions from your health care provider about eating or drinking restrictions.  If a tissue sample was removed for testing (biopsy) during your procedure, it is up to you to get your test results. Ask your health care provider, or the  department that is doing the test, when your results will be ready.  Drink enough fluid to keep your urine pale yellow.  Keep all follow-up visits as told by your health care provider. This is important. Contact a health care provider if you:  Have pain that gets worse or does not get better with medicine, especially pain when you urinate.  Have trouble urinating.  Have more blood in your urine. Get help right away if you:  Have blood clots in your urine.  Have abdominal pain.  Have a fever or chills.  Are unable to urinate. Summary  Cystoscopy is a procedure that is used to help diagnose and sometimes treat conditions that affect the lower urinary tract.  Cystoscopy is done using a thin, tube-shaped instrument with a light and camera at the end.  After the procedure, it is common to have some soreness or pain in your abdomen and urethra.  Watch for any blood in your urine. If the amount of blood in your urine increases, call your health care provider.  If  you were prescribed an antibiotic medicine, take it as told by your health care provider. Do not stop taking the antibiotic even if you start to feel better. This information is not intended to replace advice given to you by your health care provider. Make sure you discuss any questions you have with your health care provider. Document Revised: 02/26/2018 Document Reviewed: 02/26/2018 Elsevier Patient Education  2020 Else[employer] Inc.  General Anesthesia, Adult, Care After This sheet gives you information about how to care for yourself after your procedure. Your health care provider may also give you more specific instructions. If you have problems or questions, contact your health care provider. What can I expect after the procedure? After the procedure, the following side effects are common:  Pain or discomfort at the IV site.  Nausea.  Vomiting.  Sore throat.  Trouble concentrating.  Feeling cold or  chills.  Weak or tired.  Sleepiness and fatigue.  Soreness and body aches. These side effects can affect parts of the body that were not involved in surgery. Follow these instructions at home:  For at least 24 hours after the procedure:  Have a responsible adult stay with you. It is important to have someone help care for you until you are awake and alert.  Rest as needed.  Do not: ? Participate in activities in which you could fall or become injured. ? Drive. ? Use heavy machinery. ? Drink alcohol. ? Take sleeping pills or medicines that cause drowsiness. ? Make important decisions or sign legal documents. ? Take care of children on your own. Eating and drinking  Follow any instructions from your health care provider about eating or drinking restrictions.  When you feel hungry, start by eating small amounts of foods that are soft and easy to digest (bland), such as toast. Gradually return to your regular diet.  Drink enough fluid to keep your urine pale yellow.  If you vomit, rehydrate by drinking water, juice, or clear broth. General instructions  If you have sleep apnea, surgery and certain medicines can increase your risk for breathing problems. Follow instructions from your health care provider about wearing your sleep device: ? Anytime you are sleeping, including during daytime naps. ? While taking prescription pain medicines, sleeping medicines, or medicines that make you drowsy.  Return to your normal activities as told by your health care provider. Ask your health care provider what activities are safe for you.  Take over-the-counter and prescription medicines only as told by your health care provider.  If you smoke, do not smoke without supervision.  Keep all follow-up visits as told by your health care provider. This is important. Contact a health care provider if:  You have nausea or vomiting that does not get better with medicine.  You cannot eat or drink  without vomiting.  You have pain that does not get better with medicine.  You are unable to pass urine.  You develop a skin rash.  You have a fever.  You have redness around your IV site that gets worse. Get help right away if:  You have difficulty breathing.  You have chest pain.  You have blood in your urine or stool, or you vomit blood. Summary  After the procedure, it is common to have a sore throat or nausea. It is also common to feel tired.  Have a responsible adult stay with you for the first 24 hours after general anesthesia. It is important to have someone help care for you until  you are awake and alert.  When you feel hungry, start by eating small amounts of foods that are soft and easy to digest (bland), such as toast. Gradually return to your regular diet.  Drink enough fluid to keep your urine pale yellow.  Return to your normal activities as told by your health care provider. Ask your health care provider what activities are safe for you. This information is not intended to replace advice given to you by your health care provider. Make sure you discuss any questions you have with your health care provider. Document Revised: 03/09/2017 Document Reviewed: 10/20/2016 Elsevier Patient Education  [place].

## 2019-06-23 ENCOUNTER — Other Ambulatory Visit: Payer: Self-pay

## 2019-06-23 ENCOUNTER — Encounter (HOSPITAL_COMMUNITY): Payer: Self-pay

## 2019-06-23 ENCOUNTER — Encounter (HOSPITAL_COMMUNITY)
Admission: RE | Admit: 2019-06-23 | Discharge: 2019-06-23 | Disposition: A | Discharge status: Home / Self Care | Payer: Self-pay | Source: Ambulatory Visit | Attending: Urology | Admitting: Urology

## 2019-06-23 ENCOUNTER — Other Ambulatory Visit (HOSPITAL_COMMUNITY)
Admission: RE | Admit: 2019-06-23 | Discharge: 2019-06-23 | Disposition: A | Discharge status: Home / Self Care | Payer: Medicaid Other | Source: Ambulatory Visit | Attending: Urology | Admitting: Urology

## 2019-06-23 DIAGNOSIS — Z20822 Contact with and (suspected) exposure to covid-19: Secondary | ICD-10-CM | POA: Insufficient documentation

## 2019-06-23 DIAGNOSIS — Z01812 Encounter for preprocedural laboratory examination: Secondary | ICD-10-CM | POA: Diagnosis not present

## 2019-06-23 DIAGNOSIS — Z01810 Encounter for preprocedural cardiovascular examination: Secondary | ICD-10-CM | POA: Insufficient documentation

## 2019-06-23 DIAGNOSIS — I10 Essential (primary) hypertension: Secondary | ICD-10-CM | POA: Insufficient documentation

## 2019-06-23 HISTORY — DX: Claustrophobia: F40.240

## 2019-06-23 LAB — SARS CORONAVIRUS 2 (TAT 6-24 HRS): SARS Coronavirus 2: NEGATIVE

## 2019-06-23 NOTE — Pre-Procedure Instructions (Signed)
Patient in for PAT. She sees cardiologist at [facility]. Her last visit was 01/16/2019. Echo results and history from that day as well as meds and today's EKG shown to Dr Alva Garnet. No orders given.

## 2019-06-25 ENCOUNTER — Ambulatory Visit (HOSPITAL_COMMUNITY): Payer: Self-pay | Admitting: Anesthesiology

## 2019-06-25 ENCOUNTER — Encounter (HOSPITAL_COMMUNITY): Payer: Self-pay | Admitting: Urology

## 2019-06-25 ENCOUNTER — Encounter: Payer: Self-pay | Admitting: Urology

## 2019-06-25 ENCOUNTER — Ambulatory Visit (HOSPITAL_COMMUNITY): Payer: Self-pay

## 2019-06-25 ENCOUNTER — Ambulatory Visit (HOSPITAL_COMMUNITY)
Admission: RE | Admit: 2019-06-25 | Discharge: 2019-06-25 | Disposition: A | Discharge status: Home / Self Care | Payer: Self-pay | Attending: Urology | Admitting: Urology

## 2019-06-25 ENCOUNTER — Encounter (HOSPITAL_COMMUNITY)
Admission: RE | Disposition: A | Discharge status: Home / Self Care | Payer: Self-pay | Source: Home / Self Care | Attending: Urology

## 2019-06-25 ENCOUNTER — Other Ambulatory Visit (HOSPITAL_COMMUNITY): Admission: RE | Admit: 2019-06-25 | Payer: Medicaid Other | Admitting: Urology

## 2019-06-25 DIAGNOSIS — N132 Hydronephrosis with renal and ureteral calculous obstruction: Secondary | ICD-10-CM | POA: Insufficient documentation

## 2019-06-25 DIAGNOSIS — N201 Calculus of ureter: Secondary | ICD-10-CM

## 2019-06-25 DIAGNOSIS — M549 Dorsalgia, unspecified: Secondary | ICD-10-CM | POA: Insufficient documentation

## 2019-06-25 DIAGNOSIS — E890 Postprocedural hypothyroidism: Secondary | ICD-10-CM | POA: Insufficient documentation

## 2019-06-25 DIAGNOSIS — G8929 Other chronic pain: Secondary | ICD-10-CM | POA: Insufficient documentation

## 2019-06-25 DIAGNOSIS — I10 Essential (primary) hypertension: Secondary | ICD-10-CM | POA: Insufficient documentation

## 2019-06-25 DIAGNOSIS — Z79899 Other long term (current) drug therapy: Secondary | ICD-10-CM | POA: Insufficient documentation

## 2019-06-25 DIAGNOSIS — F419 Anxiety disorder, unspecified: Secondary | ICD-10-CM | POA: Insufficient documentation

## 2019-06-25 DIAGNOSIS — G4730 Sleep apnea, unspecified: Secondary | ICD-10-CM | POA: Insufficient documentation

## 2019-06-25 DIAGNOSIS — Z8673 Personal history of transient ischemic attack (TIA), and cerebral infarction without residual deficits: Secondary | ICD-10-CM | POA: Insufficient documentation

## 2019-06-25 DIAGNOSIS — K219 Gastro-esophageal reflux disease without esophagitis: Secondary | ICD-10-CM | POA: Insufficient documentation

## 2019-06-25 HISTORY — PX: CYSTOSCOPY/URETEROSCOPY/HOLMIUM LASER/STENT PLACEMENT: SHX6546

## 2019-06-25 SURGERY — CYSTOSCOPY/URETEROSCOPY/HOLMIUM LASER/STENT PLACEMENT
Anesthesia: General | Site: Ureter | Laterality: Right

## 2019-06-25 MED ORDER — LIDOCAINE 2% (20 MG/ML) 5 ML SYRINGE
INTRAMUSCULAR | Status: AC
  Filled 2019-06-25: qty 5

## 2019-06-25 MED ORDER — DEXAMETHASONE SODIUM PHOSPHATE 10 MG/ML IJ SOLN
INTRAMUSCULAR | Status: DC | PRN
  Administered 2019-06-25: 10 mg via INTRAVENOUS

## 2019-06-25 MED ORDER — HYDROMORPHONE HCL 1 MG/ML IJ SOLN
0.2500 mg | INTRAMUSCULAR | Status: DC | PRN
  Administered 2019-06-25 (×3): 0.5 mg via INTRAVENOUS
  Filled 2019-06-25 (×3): qty 0.5

## 2019-06-25 MED ORDER — MIDAZOLAM HCL 5 MG/5ML IJ SOLN
INTRAMUSCULAR | Status: DC | PRN
  Administered 2019-06-25: 2 mg via INTRAVENOUS

## 2019-06-25 MED ORDER — SODIUM CHLORIDE 0.9 % IR SOLN
Status: DC | PRN
  Administered 2019-06-25 (×2): 3000 mL

## 2019-06-25 MED ORDER — DIPHENHYDRAMINE HCL 50 MG/ML IJ SOLN
25.0000 mg | INTRAMUSCULAR | Status: DC | PRN
  Administered 2019-06-25: 25 mg via INTRAVENOUS
  Filled 2019-06-25: qty 1

## 2019-06-25 MED ORDER — ONDANSETRON HCL 4 MG/2ML IJ SOLN
INTRAMUSCULAR | Status: DC | PRN
  Administered 2019-06-25: 4 mg via INTRAVENOUS

## 2019-06-25 MED ORDER — MIDAZOLAM HCL 2 MG/2ML IJ SOLN
INTRAMUSCULAR | Status: AC
  Filled 2019-06-25: qty 2

## 2019-06-25 MED ORDER — OXYCODONE-ACETAMINOPHEN 5-325 MG PO TABS
ORAL_TABLET | ORAL | Status: AC
  Filled 2019-06-25: qty 2

## 2019-06-25 MED ORDER — LACTATED RINGERS IV SOLN: INTRAVENOUS | Status: DC | PRN

## 2019-06-25 MED ORDER — WATER FOR IRRIGATION, STERILE IR SOLN
Status: DC | PRN
  Administered 2019-06-25: 500 mL

## 2019-06-25 MED ORDER — PROPOFOL 10 MG/ML IV BOLUS
INTRAVENOUS | Status: AC
  Filled 2019-06-25: qty 20

## 2019-06-25 MED ORDER — CEFAZOLIN SODIUM-DEXTROSE 2-4 GM/100ML-% IV SOLN
2.0000 g | INTRAVENOUS | Status: AC
  Administered 2019-06-25: 08:00:00 2 g via INTRAVENOUS
  Filled 2019-06-25: qty 100

## 2019-06-25 MED ORDER — FENTANYL CITRATE (PF) 100 MCG/2ML IJ SOLN
INTRAMUSCULAR | Status: AC
  Filled 2019-06-25: qty 2

## 2019-06-25 MED ORDER — ONDANSETRON HCL 4 MG/2ML IJ SOLN: 4.0000 mg | Freq: Once | INTRAMUSCULAR | Status: DC | PRN

## 2019-06-25 MED ORDER — FENTANYL CITRATE (PF) 100 MCG/2ML IJ SOLN
INTRAMUSCULAR | Status: DC | PRN
  Administered 2019-06-25: 100 ug via INTRAVENOUS

## 2019-06-25 MED ORDER — DIATRIZOATE MEGLUMINE 30 % UR SOLN
URETHRAL | Status: AC
  Filled 2019-06-25: qty 100

## 2019-06-25 MED ORDER — MIDAZOLAM HCL 2 MG/2ML IJ SOLN
2.0000 mg | Freq: Once | INTRAMUSCULAR | Status: AC
  Administered 2019-06-25: 2 mg via INTRAVENOUS
  Filled 2019-06-25: qty 2

## 2019-06-25 MED ORDER — PROPOFOL 10 MG/ML IV BOLUS
INTRAVENOUS | Status: DC | PRN
  Administered 2019-06-25: 200 mg via INTRAVENOUS

## 2019-06-25 MED ORDER — ONDANSETRON HCL 4 MG/2ML IJ SOLN
INTRAMUSCULAR | Status: AC
  Filled 2019-06-25: qty 2

## 2019-06-25 MED ORDER — DIPHENHYDRAMINE HCL 50 MG/ML IJ SOLN
INTRAMUSCULAR | Status: AC
  Filled 2019-06-25: qty 1

## 2019-06-25 MED ORDER — DEXAMETHASONE SODIUM PHOSPHATE 10 MG/ML IJ SOLN
INTRAMUSCULAR | Status: AC
  Filled 2019-06-25: qty 1

## 2019-06-25 MED ORDER — MEPERIDINE HCL 50 MG/ML IJ SOLN
6.2500 mg | INTRAMUSCULAR | Status: DC | PRN
  Administered 2019-06-25 (×2): 12.5 mg via INTRAVENOUS
  Filled 2019-06-25: qty 1

## 2019-06-25 MED ORDER — OXYCODONE-ACETAMINOPHEN 10-325 MG PO TABS: 1.0000 | ORAL_TABLET | ORAL | 0 refills | Status: DC | PRN

## 2019-06-25 MED ORDER — LIDOCAINE HCL (CARDIAC) PF 100 MG/5ML IV SOSY
PREFILLED_SYRINGE | INTRAVENOUS | Status: DC | PRN
  Administered 2019-06-25: 60 mg via INTRAVENOUS

## 2019-06-25 MED ORDER — DIATRIZOATE MEGLUMINE 30 % UR SOLN
URETHRAL | Status: DC | PRN
  Administered 2019-06-25: 5 mL via URETHRAL

## 2019-06-25 MED ORDER — LACTATED RINGERS IV SOLN: Freq: Once | INTRAVENOUS | Status: AC

## 2019-06-25 MED ORDER — OXYCODONE-ACETAMINOPHEN 5-325 MG PO TABS: 1.5000 | ORAL_TABLET | Freq: Once | ORAL | Status: DC

## 2019-06-25 MED ORDER — KETOROLAC TROMETHAMINE 30 MG/ML IJ SOLN
INTRAMUSCULAR | Status: AC
  Filled 2019-06-25: qty 1

## 2019-06-25 MED ORDER — KETOROLAC TROMETHAMINE 30 MG/ML IJ SOLN
INTRAMUSCULAR | Status: DC | PRN
  Administered 2019-06-25: 30 mg via INTRAVENOUS

## 2019-06-25 SURGICAL SUPPLY — 25 items
BAG DRAIN URO TABLE W/ADPT NS (BAG) ×4 IMPLANT
BAG DRN 8 ADPR NS SKTRN CSTL (BAG) ×2
BAG HAMPER (MISCELLANEOUS) ×4 IMPLANT
CATH INTERMIT  6FR 70CM (CATHETERS) ×4 IMPLANT
CLOTH BEACON ORANGE TIMEOUT ST (SAFETY) ×4 IMPLANT
DECANTER SPIKE VIAL GLASS SM (MISCELLANEOUS) ×4 IMPLANT
EXTRACTOR STONE NITINOL NGAGE (UROLOGICAL SUPPLIES) ×4 IMPLANT
FIBER LASER FLEXIVA 200 (UROLOGICAL SUPPLIES) ×4 IMPLANT
GLOVE BIO SURGEON STRL SZ8 (GLOVE) ×4 IMPLANT
GLOVE BIOGEL PI IND STRL 7.0 (GLOVE) ×4 IMPLANT
GLOVE BIOGEL PI INDICATOR 7.0 (GLOVE) ×4
GLOVE ECLIPSE 6.5 STRL STRAW (GLOVE) ×2 IMPLANT
GOWN STRL REUS W/TWL LRG LVL3 (GOWN DISPOSABLE) ×4 IMPLANT
GOWN STRL REUS W/TWL XL LVL3 (GOWN DISPOSABLE) ×4 IMPLANT
GUIDEWIRE STR DUAL SENSOR (WIRE) ×4 IMPLANT
GUIDEWIRE STR ZIPWIRE 035X150 (MISCELLANEOUS) ×4 IMPLANT
IV NS IRRIG 3000ML ARTHROMATIC (IV SOLUTION) ×8 IMPLANT
KIT TURNOVER CYSTO (KITS) ×4 IMPLANT
MANIFOLD NEPTUNE II (INSTRUMENTS) ×4 IMPLANT
PACK CYSTO (CUSTOM PROCEDURE TRAY) ×4 IMPLANT
PAD ARMBOARD 7.5X6 YLW CONV (MISCELLANEOUS) ×4 IMPLANT
STENT URET 6FRX24 CONTOUR (STENTS) ×2 IMPLANT
SYR 10ML LL (SYRINGE) ×4 IMPLANT
TOWEL OR 17X26 4PK STRL BLUE (TOWEL DISPOSABLE) ×4 IMPLANT
WATER STERILE IRR 500ML POUR (IV SOLUTION) ×4 IMPLANT

## 2019-06-25 NOTE — Discharge Instructions (Signed)
Ureteral Stent Implantation, Care After This sheet gives you information about how to care for yourself after your procedure. Your health care provider may also give you more specific instructions. If you have problems or questions, contact your health care provider. What can I expect after the procedure? After the procedure, it is common to have:  Nausea.  Mild pain when you urinate. You may feel this pain in your lower back or lower abdomen. The pain should stop within a few minutes after you urinate. This may last for up to 1 week.  A small amount of blood in your urine for several days. Follow these instructions at home: Medicines  Take over-the-counter and prescription medicines only as told by your health care provider.  If you were prescribed an antibiotic medicine, take it as told by your health care provider. Do not stop taking the antibiotic even if you start to feel better.  Do not drive for 24 hours if you were given a sedative during your procedure.  Ask your health care provider if the medicine prescribed to you requires you to avoid driving or using heavy machinery. Activity  Rest as told by your health care provider.  Avoid sitting for a long time without moving. Get up to take short walks every 1-2 hours. This is important to improve blood flow and breathing. Ask for help if you feel weak or unsteady.  Return to your normal activities as told by your health care provider. Ask your health care provider what activities are safe for you. General instructions   Watch for any blood in your urine. Call your health care provider if the amount of blood in your urine increases.  If you have a catheter: ? Follow instructions from your health care provider about taking care of your catheter and collection bag. ? Do not take baths, swim, or use a hot tub until your health care provider approves. Ask your health care provider if you may take showers. You may only be allowed to  take sponge baths.  Drink enough fluid to keep your urine pale yellow.  Do not use any products that contain nicotine or tobacco, such as cigarettes, e-cigarettes, and chewing tobacco. These can delay healing after surgery. If you need help quitting, ask your health care provider.  Keep all follow-up visits as told by your health care provider. This is important. Contact a health care provider if:  You have pain that gets worse or does not get better with medicine, especially pain when you urinate.  You have difficulty urinating.  You feel nauseous or you vomit repeatedly during a period of more than 2 days after the procedure. Get help right away if:  Your urine is dark red or has blood clots in it.  You are leaking urine (have incontinence).  The end of the stent comes out of your urethra.  You cannot urinate.  You have sudden, sharp, or severe pain in your abdomen or lower back.  You have a fever.  You have swelling or pain in your legs.  You have difficulty breathing. Summary  After the procedure, it is common to have mild pain when you urinate that goes away within a few minutes after you urinate. This may last for up to 1 week.  Watch for any blood in your urine. Call your health care provider if the amount of blood in your urine increases.  Take over-the-counter and prescription medicines only as told by your health care provider.  Drink   enough fluid to keep your urine pale yellow. This information is not intended to replace advice given to you by your health care provider. Make sure you discuss any questions you have with your health care provider. Document Revised: 12/11/2017 Document Reviewed: 12/12/2017 Elsevier Patient Education  2020 [employer]  PLEASE REMOVE YOUR STENT IN 72 HOURS BY GENTLY PULLING THE STRING  

## 2019-06-25 NOTE — Interval H&P Note (Signed)
History and Physical Interval Note:  06/25/2019 7:34 AM  Teresa Vincent  has presented today for surgery, with the diagnosis of right ureteral calculus.  The various methods of treatment have been discussed with the patient and family. After consideration of risks, benefits and other options for treatment, the patient has consented to  Procedure(s): CYSTOSCOPY WITH RETROGRADE PYELOGRAM, URETEROSCOPY AND STENT PLACEMENT (Right) as a surgical intervention.  The patient's history has been reviewed, patient examined, no change in status, stable for surgery.  I have reviewed the patient's chart and labs.  Questions were answered to the patient's satisfaction.     Wilkie Aye

## 2019-06-25 NOTE — Anesthesia Postprocedure Evaluation (Signed)
Anesthesia Post Note  Patient: Teresa Vincent  Procedure(s) Performed: CYSTOSCOPY WITH RETROGRADE PYELOGRAM URETEROSCOPY/HOLMIUM LASER/STENT PLACEMENT (Right Ureter)  Patient location during evaluation: PACU Anesthesia Type: General Level of consciousness: awake, oriented, awake and alert and patient cooperative Pain management: pain level controlled Vital Signs Assessment: post-procedure vital signs reviewed and stable Respiratory status: spontaneous breathing, respiratory function stable and nonlabored ventilation Cardiovascular status: stable Postop Assessment: no apparent nausea or vomiting Anesthetic complications: no     Last Vitals:  Vitals:   06/25/19 0645  BP: 126/86  Pulse: 70  Resp: 14  Temp: 36.8 C  SpO2: 97%    Last Pain:  Vitals:   06/25/19 0645  TempSrc: Oral  PainSc: 3                  Marque Bango

## 2019-06-25 NOTE — Anesthesia Procedure Notes (Signed)
Procedure Name: LMA Insertion Date/Time: 06/25/2019 7:45 AM Performed by: Shanon Payor, CRNA Pre-anesthesia Checklist: Patient identified, Emergency Drugs available, Suction available, Patient being monitored and Timeout performed Patient Re-evaluated:Patient Re-evaluated prior to induction Oxygen Delivery Method: Circle system utilized Preoxygenation: Pre-oxygenation with 100% oxygen Induction Type: IV induction LMA: LMA inserted LMA Size: 4.0 Number of attempts: 1 Placement Confirmation: positive ETCO2 and breath sounds checked- equal and bilateral Tube secured with: Tape Dental Injury: Teeth and Oropharynx as per pre-operative assessment

## 2019-06-25 NOTE — Transfer of Care (Signed)
Immediate Anesthesia Transfer of Care Note  Patient: Teresa Vincent  Procedure(s) Performed: CYSTOSCOPY WITH RETROGRADE PYELOGRAM URETEROSCOPY/HOLMIUM LASER/STENT PLACEMENT (Right Ureter)  Patient Location: PACU  Anesthesia Type:General  Level of Consciousness: awake, alert, oriented, cooperative  Airway & Oxygen Therapy: Patient Spontanous Breathing  Post-op Assessment: Report given to RN, Post -op Vital signs reviewed and stable and Patient moving all extremities X 4  Post vital signs: Reviewed and stable  Last Vitals:  Vitals Value Taken Time  BP    Temp    Pulse 75 06/25/19 0825  Resp 7 06/25/19 0825  SpO2 98 % 06/25/19 0825  Vitals shown include unvalidated device data.  Last Pain:  Vitals:   06/25/19 0645  TempSrc: Oral  PainSc: 3       Patients Stated Pain Goal: 3 (06/25/19 0645)  Complications: No apparent anesthesia complications

## 2019-06-25 NOTE — Op Note (Signed)
Preoperative diagnosis: Right ureteral stone  Postoperative diagnosis: Same  Procedure: 1 cystoscopy 2 right retrograde pyelography 3.  Intraoperative fluoroscopy, under one hour, with interpretation 4.  Right ureteroscopic stone manipulation with laser lithotripsy 5.  Right 6 x 26 JJ stent placement  Attending: Cleda Mccreedy  Anesthesia: General  Estimated blood loss: None  Drains: Right 6 x 24 JJ ureteral stent with tether  Specimens: stone for analysis  Antibiotics: ancef  Findings: Mild right hydronephrosis. Right distal ureteral calculus.  Indications: Patient is a 39 year old female with a history of ureteral stone and who has failed medical expulsive therapy.  After discussing treatment options, she decided proceed with right ureteroscopic stone manipulation.  Procedure her in detail: The patient was brought to the operating room and a brief timeout was done to ensure correct patient, correct procedure, correct site.  General anesthesia was administered patient was placed in dorsal lithotomy position.  Her genitalia was then prepped and draped in usual sterile fashion.  A rigid 22 French cystoscope was passed in the urethra and the bladder.  Bladder was inspected free masses or lesions.  the ureteral orifices were in the normal orthotopic locations.  a 6 french ureteral catheter was then instilled into the right ureter orifice.  a gentle retrograde was obtained and findings noted above.  we then placed a zip wire through the ureteral catheter and advanced up to the renal pelvis.  we then removed the cystoscope and cannulated the right ureteral orifice with a semirigid ureteroscope.  we then encountered the stone in the distal ureter.  using a 200 nm laser fiber and fragmented the stone into smaller pieces.  the pieces were then removed with a Ngage basket.  Once all stone fragments were removed we then placed a 6 x 24 double-j ureteral stent over the original zip wire. We then  removed the wire and good coil was noted in the the renal pelvis under fluoroscopy and the bladder under direct vision.  the stone fragments were then removed from the bladder and sent for analysis.   the bladder was then drained and this concluded the procedure which was well tolerated by patient.  Complications: None  Condition: Stable, extubated, transferred to PACU  Plan: Patient is to be discharged home as to follow-up in one week. She is to remove her stent in 72 hours by pulling the tether

## 2019-06-25 NOTE — Anesthesia Preprocedure Evaluation (Addendum)
Anesthesia Evaluation  Patient identified by MRN, date of birth, ID band Patient awake    Reviewed: Allergy & Precautions, NPO status , Patient's Chart, lab work & pertinent test results  History of Anesthesia Complications (+) PROLONGED EMERGENCE and history of anesthetic complications  Airway Mallampati: II  TM Distance: >3 FB Neck ROM: Full    Dental  (+) Teeth Intact, Dental Advisory Given   Pulmonary asthma , sleep apnea (noncomplaint with CPAP) , Current Smoker and Patient abstained from smoking., former smoker,    Pulmonary exam normal breath sounds clear to auscultation       Cardiovascular Exercise Tolerance: Good hypertension (did not take meds this morning), Pt. on medications Normal cardiovascular exam Rhythm:Regular Rate:Normal  23-Jun-2019 13:53:49 [facility] ROUTINE RECORD Normal sinus rhythm Normal ECG Non-specific ST-t changes RESOLVED SINCE PREVIOUS Confirmed by Olga Millers ([id number]) on 06/23/2019 5:21:15 PM   Neuro/Psych  Headaches, Anxiety Sciatica  Neuromuscular disease CVA, No Residual Symptoms    GI/Hepatic GERD (Did not take medication this morning)  Medicated and Controlled,(+)     substance abuse (last use monday)  marijuana use,   Endo/Other  Hypothyroidism   Renal/GU Renal disease (stones)     Musculoskeletal Chronic Back pain - takes oxycodone   Abdominal   Peds  Hematology negative hematology ROS (+)   Anesthesia Other Findings   Reproductive/Obstetrics negative OB ROS                            Anesthesia Physical  Anesthesia Plan  ASA: III  Anesthesia Plan: General   Post-op Pain Management:    Induction: Intravenous  PONV Risk Score and Plan: 4 or greater and Treatment may vary due to age or medical condition, Ondansetron, Dexamethasone and Midazolam  Airway Management Planned: LMA  Additional Equipment:   Intra-op  Plan:   Post-operative Plan: Extubation in OR  Informed Consent: I have reviewed the patients History and Physical, chart, labs and discussed the procedure including the risks, benefits and alternatives for the proposed anesthesia with the patient or authorized representative who has indicated his/her understanding and acceptance.     Dental advisory given  Plan Discussed with: CRNA and Surgeon  Anesthesia Plan Comments:         Anesthesia Quick Evaluation

## 2019-06-26 ENCOUNTER — Telehealth: Payer: Self-pay | Admitting: Urology

## 2019-06-26 ENCOUNTER — Other Ambulatory Visit: Payer: Self-pay

## 2019-06-26 ENCOUNTER — Emergency Department (HOSPITAL_COMMUNITY)
Admission: EM | Admit: 2019-06-26 | Discharge: 2019-06-26 | Disposition: A | Discharge status: Home / Self Care | Payer: Medicaid Other | Attending: Emergency Medicine | Admitting: Emergency Medicine

## 2019-06-26 DIAGNOSIS — E890 Postprocedural hypothyroidism: Secondary | ICD-10-CM | POA: Insufficient documentation

## 2019-06-26 DIAGNOSIS — Z79899 Other long term (current) drug therapy: Secondary | ICD-10-CM | POA: Insufficient documentation

## 2019-06-26 DIAGNOSIS — I10 Essential (primary) hypertension: Secondary | ICD-10-CM | POA: Insufficient documentation

## 2019-06-26 DIAGNOSIS — Z87891 Personal history of nicotine dependence: Secondary | ICD-10-CM | POA: Insufficient documentation

## 2019-06-26 DIAGNOSIS — R300 Dysuria: Secondary | ICD-10-CM | POA: Insufficient documentation

## 2019-06-26 LAB — COMPREHENSIVE METABOLIC PANEL
ALT: 17 U/L (ref 0–44)
AST: 21 U/L (ref 15–41)
Albumin: 4.3 g/dL (ref 3.5–5.0)
Alkaline Phosphatase: 57 U/L (ref 38–126)
Anion gap: 9 (ref 5–15)
BUN: 19 mg/dL (ref 6–20)
CO2: 22 mmol/L (ref 22–32)
Calcium: 9.4 mg/dL (ref 8.9–10.3)
Chloride: 106 mmol/L (ref 98–111)
Creatinine, Ser: 0.87 mg/dL (ref 0.44–1.00)
GFR calc Af Amer: >60 mL/min (ref >60)
GFR calc non Af Amer: >60 mL/min (ref >60)
Glucose, Bld: 106 mg/dL — ABNORMAL HIGH (ref 70–99)
Potassium: 4.2 mmol/L (ref 3.5–5.1)
Sodium: 137 mmol/L (ref 135–145)
Total Bilirubin: 0.5 mg/dL (ref 0.3–1.2)
Total Protein: 7.2 g/dL (ref 6.5–8.1)

## 2019-06-26 LAB — CBC WITH DIFFERENTIAL/PLATELET
Abs Immature Granulocytes: 0.11 10*3/uL — ABNORMAL HIGH (ref 0.00–0.07)
Basophils Absolute: 0 10*3/uL (ref 0.0–0.1)
Basophils Relative: 0 %
Eosinophils Absolute: 0 10*3/uL (ref 0.0–0.5)
Eosinophils Relative: 0 %
HCT: 41.4 % (ref 36.0–46.0)
Hemoglobin: 13.9 g/dL (ref 12.0–15.0)
Immature Granulocytes: 1 %
Lymphocytes Relative: 15 %
Lymphs Abs: 2.9 10*3/uL (ref 0.7–4.0)
MCH: 31.2 pg (ref 26.0–34.0)
MCHC: 33.6 g/dL (ref 30.0–36.0)
MCV: 92.8 fL (ref 80.0–100.0)
Monocytes Absolute: 1.3 10*3/uL — ABNORMAL HIGH (ref 0.1–1.0)
Monocytes Relative: 7 %
Neutro Abs: 15.2 10*3/uL — ABNORMAL HIGH (ref 1.7–7.7)
Neutrophils Relative %: 77 %
Platelets: 299 10*3/uL (ref 150–400)
RBC: 4.46 MIL/uL (ref 3.87–5.11)
RDW: 12.8 % (ref 11.5–15.5)
WBC: 19.5 10*3/uL — ABNORMAL HIGH (ref 4.0–10.5)
nRBC: 0 % (ref 0.0–0.2)

## 2019-06-26 LAB — URINALYSIS, ROUTINE W REFLEX MICROSCOPIC

## 2019-06-26 LAB — URINALYSIS, MICROSCOPIC (REFLEX)
RBC / HPF: >50 RBC/hpf (ref 0–5)
Squamous Epithelial / HPF: NONE SEEN (ref 0–5)

## 2019-06-26 MED ORDER — CIPROFLOXACIN HCL 500 MG PO TABS: 500.0000 mg | ORAL_TABLET | Freq: Two times a day (BID) | ORAL | 0 refills | Status: DC

## 2019-06-26 MED ORDER — PHENAZOPYRIDINE HCL 200 MG PO TABS: 200.0000 mg | ORAL_TABLET | Freq: Three times a day (TID) | ORAL | 0 refills | Status: AC

## 2019-06-26 MED ORDER — PHENAZOPYRIDINE HCL 100 MG PO TABS
200.0000 mg | ORAL_TABLET | Freq: Once | ORAL | Status: AC
  Administered 2019-06-26: 13:00:00 200 mg via ORAL
  Filled 2019-06-26: qty 2

## 2019-06-26 MED ORDER — CIPROFLOXACIN HCL 250 MG PO TABS
500.0000 mg | ORAL_TABLET | Freq: Once | ORAL | Status: AC
  Administered 2019-06-26: 500 mg via ORAL
  Filled 2019-06-26: qty 2

## 2019-06-26 MED ORDER — MORPHINE SULFATE (PF) 4 MG/ML IV SOLN
4.0000 mg | Freq: Once | INTRAVENOUS | Status: AC
  Administered 2019-06-26: 4 mg via INTRAVENOUS
  Filled 2019-06-26: qty 1

## 2019-06-26 MED ORDER — ONDANSETRON HCL 4 MG/2ML IJ SOLN
4.0000 mg | Freq: Once | INTRAMUSCULAR | Status: AC
  Administered 2019-06-26: 4 mg via INTRAVENOUS
  Filled 2019-06-26: qty 2

## 2019-06-26 NOTE — Telephone Encounter (Signed)
Patient called and left message stating she is going to the ER. She states she had surgery yesterday and she is having more blood than she feels is normal. She did not request a call back but just wanted [place] to know her plan.

## 2019-06-26 NOTE — ED Provider Notes (Signed)
Teresa Vincent   CSN: [id number] Arrival date & time: 06/26/19  1002     History Chief Complaint  Patient presents with  . Pelvic Pain    Teresa Vincent is a 39 y.o. female with past medical history significant for chronic back pain, kidney stones, migraines, sciatica presents to emergency department today with chief complaint of abdominal and vaginal pain x 2 days. Patient is reporting feeling a burning sensation in her vagina and when she urinates. She sat in the bathtub last night and noticed she was having increased vaginal bleeding. She tried taking the percocet she was prescribed with minimal symptom relief. She denies fever, chills, abdominal pain, nausea, vomiting,   Patient had right 6 x 26 JJ stent placed yesterday as well as right ureteroscopic stone manipulation with laser lithotripsy.  Past Medical History:  Diagnosis Date  . Abnormal Pap smear of cervix   . ANXIETY 08/30/2007  . Asthma   . Chronic back pain   . Claustrophobia    pt requests Versed before surgery.  . Complication of anesthesia    hx of waking up during anesthesia  . GERD (gastroesophageal reflux disease)   . History of kidney stones   . Hypertension   . 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 INSOMNIA 08/30/2007  . MIGRAINE HEADACHE 08/30/2007  . Neck pain   . Palpitations    occasionally couple times a week  . Sciatica   . Sleep apnea    cpap  . Stroke [facility])    states, no deficits and on no meds now.    Patient Active Problem List   Diagnosis Date Noted  . Dysuria 06/18/2019  . Nephrolithiasis 04/23/2019  . S/P lumbar fusion 02/28/2018  . Prediabetes 12/04/2017  . Current smoker 12/04/2017  . S/P lumbar spinal fusion 05/29/2017  . Rectal bleeding 02/01/2017  . Post-operative pain 12/30/2016  . Calculus of gallbladder without cholecystitis without obstruction   . Mixed hyperlipidemia 10/11/2016  . 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 Carpal tunnel  syndrome of right wrist 06/10/2015  . Class 2 severe obesity due to excess calories with serious comorbidity and body mass index (BMI) of 36.0 to 36.9 in adult [facility]) 06/10/2015  . Left-sided low back pain with left-sided sciatica 05/03/2015  . Essential hypertension, benign 12/30/2012  . Esophageal reflux 12/30/2012  . 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 ANXIETY 08/30/2007  . MIGRAINE HEADACHE 08/30/2007  . Insomnia 08/30/2007    Past Surgical History:  Procedure Laterality Date  . ABDOMINAL EXPOSURE N/A 05/29/2017   Procedure: ABDOMINAL EXPOSURE;  Surgeon: Chuck Hint, MD;  [facility];  Service: Vascular;  Laterality: N/A;  . ABDOMINAL HYSTERECTOMY    . ANTERIOR LUMBAR FUSION N/A 05/29/2017   Procedure: Anterior Lumbar Interbody Fusion  - Lumbar five sacral one;  Surgeon: Tia Alert, MD;  Location: [facility] OR;  Service: Neurosurgery;  Laterality: N/A;  . CARPAL TUNNEL RELEASE Bilateral   . CHOLECYSTECTOMY    . CHOLECYSTECTOMY N/A 12/29/2016   Procedure: LAPAROSCOPIC CHOLECYSTECTOMY;  Surgeon: [name], MD;  Location: [facility] OR[facility];  Service: General;  Laterality: N/A;  . CYSTOSCOPY/URETEROSCOPY/HOLMIUM LASER/STENT PLACEMENT Right 06/25/2019   Procedure: CYSTOSCOPY WITH RIGHT RETROGRADE PYELOGRAM/RIGHT URETEROSCOPY/HOLMIUM LASER APPLICATION RIGHT URETERAL CALCULUS/RIGHT URETERAL STENT PLACEMENT;  Surgeon: Malen Gauze, MD;  Location: [facility];  Service: Urology;  Laterality: Right;  . EXTRACORPOREAL SHOCK WAVE LITHOTRIPSY Right 04/10/2019   Procedure: EXTRACORPOREAL SHOCK WAVE LITHOTRIPSY (ESWL);  Surgeon: Noel Christmas, MD;  Location: [place];  Service: Urology;  Laterality: Right;  . LAMINECTOMY WITH POSTERIOR LATERAL ARTHRODESIS LEVEL 1 N/A 02/28/2018   Procedure: Laminectomy and Foraminotomy Lumbar Five-Sacral One, posterolateral fusion and fixation;  Surgeon: Eustace Moore, MD;  Location: [place];  Service: Neurosurgery;   Laterality: N/A;  posterior  . LUMBAR EPIDURAL INJECTION    . STERIOD INJECTION N/A 01/10/2017   Procedure: MINOR EXPAREL INJECTION;  Surgeon: Virl Cagey, MD;  Location: [facility];  Service: General;  Laterality: N/A;  . TOTAL ABDOMINAL HYSTERECTOMY W/ BILATERAL SALPINGOOPHORECTOMY N/A   . TUBAL LIGATION  2003     OB History    Gravida  4   Para  1   Term  1   Preterm      AB  3   Living  1     SAB      TAB      Ectopic      Multiple      Live Births              Family History  Problem Relation Age of Onset  . Hypertension Mother   . Thyroid disease Neg Hx     Social History   Tobacco Use  . Smoking status: Former Smoker    Packs/day: 1.00    Years: 26.00    Pack years: 26.00    Types: Cigarettes    Quit date: 06/19/2019    Years since quitting: 0.0  . Smokeless tobacco: Former Network engineer Use Topics  . Alcohol use: Yes    Alcohol/week: 0.0 standard drinks    Comment: occasional  . Drug use: Yes    Types: Marijuana    Comment: occasional    Home Medications Prior to Admission medications   Medication Sig Start Date End Date Taking? Authorizing Provider  ALPRAZolam Duanne Moron) 0.5 MG tablet TAKE 1 TABLET BY MOUTH AT BEDTIME AS NEEDED FOR SLEEP 01/07/19  Yes Mikey Kirschner, MD  atorvastatin (LIPITOR) 20 MG tablet Take 1 tablet by mouth once daily 02/11/19  Yes Nida, Marella Chimes, MD  cholestyramine Lucrezia Starch) 4 GM/DOSE powder One scoop BID prn diarrhea 12/03/18  Yes Mikey Kirschner, MD  cyclobenzaprine (FLEXERIL) 10 MG tablet Take 1 tablet (10 mg total) by mouth 3 (three) times daily as needed. for muscle spams 02/28/18  Yes Eustace Moore, MD  EUTHYROX 175 MCG tablet TAKE 1 TABLET BY MOUTH ONCE DAILY BEFORE BREAKFAST 02/17/19  Yes Nida, Marella Chimes, MD  lisinopril (ZESTRIL) 10 MG tablet Take 1 tablet (10 mg total) by mouth daily. 01/07/19  Yes Cassandria Anger, MD  metoprolol tartrate (LOPRESSOR) 50 MG tablet Take 25 mg by  mouth 2 (two) times daily.  02/14/19  Yes [provider]  mirabegron ER (MYRBETRIQ) 25 MG TB24 tablet Take 1 tablet (25 mg total) by mouth daily. 06/18/19  Yes McKenzie, Candee Furbish, MD  ondansetron (ZOFRAN-ODT) 4 MG disintegrating tablet DISSOLVE 1 TABLET IN MOUTH EVERY 8 HOURS AS NEEDED FOR NAUSEA AND VOMITING 06/02/19  Yes Mikey Kirschner, MD  oxyCODONE-acetaminophen (PERCOCET) 10-325 MG tablet Take 1 tablet by mouth every 4 (four) hours as needed for pain. 06/25/19  Yes McKenzie, Candee Furbish, MD  pantoprazole (PROTONIX) 40 MG tablet TAKE 1 TABLET BY MOUTH ONCE DAILY 30  MINUTES  PRIOR  TO  A  MEAL 05/12/19  Yes Mikey Kirschner, MD  tamsulosin (FLOMAX) 0.4 MG CAPS capsule Take 1 capsule (0.4 mg total) by mouth  daily after supper. 04/09/19  Yes McKenzie, Mardene Celeste, MD  ciprofloxacin (CIPRO) 500 MG tablet Take 1 tablet (500 mg total) by mouth every 12 (twelve) hours. 06/27/19   Leida Luton E, PA-C  phenazopyridine (PYRIDIUM) 200 MG tablet Take 1 tablet (200 mg total) by mouth 3 (three) times daily for 2 days. 06/26/19 06/28/19  Lawrence Mitch E, PA-C    Allergies    Adhesive [tape] and Celexa [citalopram hydrobromide]  Review of Systems   Review of Systems  Physical Exam Updated Vital Signs BP 108/75 (BP Location: Left Arm)   Pulse 72   Temp 98.2 F (36.8 C) (Oral)   Resp 18   Ht 5\' 7"  (1.702 m)   Wt 104.3 kg   LMP 02/25/2016   SpO2 100%   BMI 36.02 kg/m   Physical Exam  ED Results / Procedures / Treatments   Labs (all labs ordered are listed, but only abnormal results are displayed) Labs Reviewed  CBC WITH DIFFERENTIAL/PLATELET - Abnormal; Notable for the following components:      Result Value   WBC 19.5 (*)    Neutro Abs 15.2 (*)    Monocytes Absolute 1.3 (*)    Abs Immature Granulocytes 0.11 (*)    All other components within normal limits  COMPREHENSIVE METABOLIC PANEL - Abnormal; Notable for the following components:   Glucose, Bld 106 (*)    All other  components within normal limits  URINALYSIS, ROUTINE W REFLEX MICROSCOPIC - Abnormal; Notable for the following components:   Color, Urine RED (*)    APPearance TURBID (*)    Glucose, UA   (*)    Value: TEST NOT REPORTED DUE TO COLOR INTERFERENCE OF URINE PIGMENT   Hgb urine dipstick   (*)    Value: TEST NOT REPORTED DUE TO COLOR INTERFERENCE OF URINE PIGMENT   Bilirubin Urine   (*)    Value: TEST NOT REPORTED DUE TO COLOR INTERFERENCE OF URINE PIGMENT   Ketones, ur   (*)    Value: TEST NOT REPORTED DUE TO COLOR INTERFERENCE OF URINE PIGMENT   Protein, ur   (*)    Value: TEST NOT REPORTED DUE TO COLOR INTERFERENCE OF URINE PIGMENT   Nitrite   (*)    Value: TEST NOT REPORTED DUE TO COLOR INTERFERENCE OF URINE PIGMENT   Leukocytes,Ua   (*)    Value: TEST NOT REPORTED DUE TO COLOR INTERFERENCE OF URINE PIGMENT   All other components within normal limits  URINALYSIS, MICROSCOPIC (REFLEX) - Abnormal; Notable for the following components:   Bacteria, UA FEW (*)    All other components within normal limits  URINE CULTURE    EKG None  Radiology DG C-Arm 1-60 Min-No Report  Result Date: 06/25/2019 Fluoroscopy was utilized by the requesting physician.  No radiographic interpretation.    Procedures Procedures (including critical care time)  Medications Ordered in ED Medications  phenazopyridine (PYRIDIUM) tablet 200 mg (has no administration in time range)  ciprofloxacin (CIPRO) tablet 500 mg (has no administration in time range)  morphine 4 MG/ML injection 4 mg (4 mg Intravenous Given 06/26/19 1100)  ondansetron (ZOFRAN) injection 4 mg (4 mg Intravenous Given 06/26/19 1100)    ED Course  I have reviewed the triage vital signs and the nursing notes.  Pertinent labs & imaging results that were available during my care of the patient were reviewed by me and considered in my medical decision making (see chart for details).    MDM Rules/Calculators/A&P  Patient  seen and examined. Patient presents awake, alert, hemodynamically stable, afebrile, non toxic.  She is not tachycardic or hypoxic.  On exam she has no abdominal tenderness, no peritoneal signs.  No CVA tenderness.  Her incision site is well-appearing, bandage is clear dry and intact.  Labs today show leukocytosis of 19.5, no anemia.  No severe electrolyte derangement: No renal insufficiency.  UA collected and was red and turbid so further testing not able to be performed.  Case discussed with on-call urologist Dr. Annabell Howells.  He reviewed patient's history and lab work today.  Patient did receive Ancef yesterday before her procedure giving her some antibiotic coverage.  He recommends sending her urine for culture and covering for possible infection in the interim with Cipro.  Can also give Pyridium for bladder spasms.  Patient is already scheduled for follow-up on 07/03/2019 with Dr. Ronne Binning. Will discharge with those prescriptions. Patient's vitals here are reassuring as she is febrile, no indications of sepsis.   The patient appears reasonably screened and/or stabilized for discharge and I doubt any other medical condition or other [facility] requiring further screening, evaluation, or treatment in the ED at this time prior to discharge. The patient is safe for discharge with strict return precautions discussed. Recommend patient keep her urology appointment as scheduled.   Portions of this Vincent were generated with Scientist, clinical (histocompatibility and immunogenetics). Dictation errors may occur despite best attempts at proofreading.  Final Clinical Impression(s) / ED Diagnoses Final diagnoses:  Dysuria    Rx / DC Orders ED Discharge Orders         Ordered    ciprofloxacin (CIPRO) 500 MG tablet  Every 12 hours     06/26/19 1258    phenazopyridine (PYRIDIUM) 200 MG tablet  3 times daily     06/26/19 1258           Karol Skarzynski, Caroleen Hamman, PA-C 06/26/19 1321    Vanetta Mulders, MD 06/27/19 [phone]

## 2019-06-26 NOTE — ED Triage Notes (Signed)
Had surgery for kidney stones and stent placed.  Lower abd and outer vaginal area pain all night

## 2019-06-26 NOTE — Discharge Instructions (Addendum)
You have been seen today for pelvic pain. Please read and follow all provided instructions. Return to the emergency room for worsening condition or new concerning symptoms.    1. Medications:  Prescription sent to your pharmacy for ciprofloxacin.  This is an antibiotic used to treat your possible urinary tract infection.  Start taking this medicine tomorrow.  -Prescription also sent for Pyridium.  This is used to treat bladder spasms and the burning sensation you are feeling.  You can take this medicine 3 times a day, you already had 1 dose here in the emergency department.  Take your next dose tonight before bed.  Continue usual home medications Take medications as prescribed. Please review all of the medicines and only take them if you do not have an allergy to them.   2. Treatment: rest, drink plenty of fluids  3. Follow Up:   -Keep your appointment scheduled with urology on 07/03/2019. You can call the office if you have any questions  It is also a possibility that you have an allergic reaction to any of the medicines that you have been prescribed - Everybody reacts differently to medications and while MOST people have no trouble with most medicines, you may have a reaction such as nausea, vomiting, rash, swelling, shortness of breath. If this is the case, please stop taking the medicine immediately and contact your physician.  ?

## 2019-06-27 ENCOUNTER — Ambulatory Visit: Payer: Self-pay

## 2019-06-27 ENCOUNTER — Other Ambulatory Visit: Payer: Self-pay

## 2019-06-27 ENCOUNTER — Telehealth: Payer: Self-pay | Admitting: Urology

## 2019-06-27 DIAGNOSIS — N200 Calculus of kidney: Secondary | ICD-10-CM

## 2019-06-27 NOTE — Telephone Encounter (Signed)
See attached message

## 2019-06-27 NOTE — Telephone Encounter (Signed)
Pt did not feel comfortable pulling the post op tether stent. Pt scheduled for nv to have stent pulled in office today.

## 2019-06-27 NOTE — Telephone Encounter (Signed)
Pt called and stated she went to the ER yesterday due to pain and bleeding. States she wants her stent removed and asks if she can come in today.

## 2019-06-28 LAB — URINE CULTURE: Culture: NO GROWTH

## 2019-07-01 LAB — CALCULI, WITH PHOTOGRAPH (CLINICAL LAB)
Calcium Oxalate Dihydrate: 70 %
Calcium Oxalate Monohydrate: 25 %
Hydroxyapatite: 5 %
Weight Calculi: 23 mg

## 2019-07-01 NOTE — Progress Notes (Signed)
Pt. came in and stents removed without difficulty.

## 2019-07-02 ENCOUNTER — Ambulatory Visit: Payer: Self-pay | Admitting: Urology

## 2019-07-02 ENCOUNTER — Ambulatory Visit: Payer: Medicaid Other | Admitting: Urology

## 2019-07-08 ENCOUNTER — Other Ambulatory Visit: Payer: Self-pay | Admitting: Family Medicine

## 2019-07-08 NOTE — Telephone Encounter (Signed)
6 mo 

## 2019-07-16 ENCOUNTER — Ambulatory Visit: Payer: Self-pay | Admitting: Urology

## 2019-07-23 ENCOUNTER — Encounter: Payer: Self-pay | Admitting: Family Medicine

## 2019-07-23 ENCOUNTER — Other Ambulatory Visit: Payer: Self-pay

## 2019-07-23 ENCOUNTER — Ambulatory Visit (INDEPENDENT_AMBULATORY_CARE_PROVIDER_SITE_OTHER): Payer: Self-pay | Admitting: Family Medicine

## 2019-07-23 VITALS — BP 120/72 | Temp 97.5°F | Ht 67.0 in | Wt 226.8 lb

## 2019-07-23 DIAGNOSIS — M7712 Lateral epicondylitis, left elbow: Secondary | ICD-10-CM

## 2019-07-23 NOTE — Progress Notes (Signed)
   Subjective:    Patient ID: Teresa Vincent, female    DOB: [birth year], 39 y.o.   MRN: [id number]  HPI  Patient arrives with left elbow pain for 1.5 months.   Wearing a splint on the left elbow  Provides cold support, helps a bit   Lasting now for two mo   Has tried ibu and aleave     Review of Systems No headache, no major weight loss or weight gain, no chest pain no back pain abdominal pain no change in bowel habits complete ROS otherwise negative     Objective:   Physical Exam Alert vitals stable, NAD. Blood pressure good on repeat. HEENT normal. Lungs clear. Heart regular rate and rhythm. Patient was prepped and injected with 1/2 cc Depo-Medrol and 1 cc Xylocaine       Assessment & Plan:

## 2019-07-30 ENCOUNTER — Other Ambulatory Visit: Payer: Self-pay | Admitting: *Deleted

## 2019-07-30 ENCOUNTER — Telehealth: Payer: Self-pay | Admitting: Family Medicine

## 2019-07-30 DIAGNOSIS — M7712 Lateral epicondylitis, left elbow: Secondary | ICD-10-CM

## 2019-07-30 NOTE — Telephone Encounter (Signed)
Referral in Epic, patient notified.

## 2019-07-30 NOTE — Telephone Encounter (Signed)
Ortho referral,

## 2019-07-30 NOTE — Telephone Encounter (Signed)
Teresa Vincent called and said she come in on  5/5 for shot in her elbow and it did not work and wanted to know if she can schedule  appointment for another shot or wants to know what she needs to do.

## 2019-08-06 ENCOUNTER — Other Ambulatory Visit: Payer: Self-pay | Admitting: "Endocrinology

## 2019-08-06 ENCOUNTER — Telehealth: Payer: Self-pay | Admitting: "Endocrinology

## 2019-08-06 ENCOUNTER — Other Ambulatory Visit: Payer: Self-pay | Admitting: Family Medicine

## 2019-08-06 DIAGNOSIS — E890 Postprocedural hypothyroidism: Secondary | ICD-10-CM

## 2019-08-06 DIAGNOSIS — R7303 Prediabetes: Secondary | ICD-10-CM

## 2019-08-06 NOTE — Telephone Encounter (Signed)
Can you update labs 

## 2019-08-06 NOTE — Telephone Encounter (Signed)
Lab orders updated and sent to [facility]. 

## 2019-08-07 ENCOUNTER — Encounter: Payer: Self-pay | Admitting: Family Medicine

## 2019-08-08 ENCOUNTER — Telehealth: Payer: Self-pay | Admitting: Family Medicine

## 2019-08-08 NOTE — Telephone Encounter (Signed)
Pt requesting refill on Zofran 4 mg tablet. Pt last seen 07/23/19 for lateral epicondylitis of left elbow. Please advise. Thank you.  [facility] [place].

## 2019-08-10 NOTE — Telephone Encounter (Signed)
Yes numb 30 one q 8 hrs prn two ref

## 2019-08-11 ENCOUNTER — Other Ambulatory Visit: Payer: Self-pay | Admitting: *Deleted

## 2019-08-11 MED ORDER — ONDANSETRON 4 MG PO TBDP: ORAL_TABLET | ORAL | 2 refills | Status: DC

## 2019-08-11 NOTE — Telephone Encounter (Signed)
Med sent to pharm. Pt notified.  

## 2019-08-25 ENCOUNTER — Telehealth: Payer: Self-pay | Admitting: Family Medicine

## 2019-08-25 ENCOUNTER — Other Ambulatory Visit: Payer: Self-pay

## 2019-08-25 ENCOUNTER — Ambulatory Visit
Admission: EM | Admit: 2019-08-25 | Discharge: 2019-08-25 | Disposition: A | Discharge status: Home / Self Care | Payer: 59 | Attending: Emergency Medicine | Admitting: Emergency Medicine

## 2019-08-25 DIAGNOSIS — R21 Rash and other nonspecific skin eruption: Secondary | ICD-10-CM | POA: Diagnosis not present

## 2019-08-25 MED ORDER — PREDNISONE 10 MG (21) PO TBPK
ORAL_TABLET | Freq: Every day | ORAL | 0 refills | Status: DC
Start: 2019-08-25 — End: 2019-09-01

## 2019-08-25 MED ORDER — DEXAMETHASONE SODIUM PHOSPHATE 10 MG/ML IJ SOLN
10.0000 mg | Freq: Once | INTRAMUSCULAR | Status: AC
  Administered 2019-08-25: 10 mg via INTRAMUSCULAR

## 2019-08-25 MED ORDER — VALACYCLOVIR HCL 1 G PO TABS
1000.0000 mg | ORAL_TABLET | Freq: Three times a day (TID) | ORAL | 0 refills | Status: DC
Start: 2019-08-25 — End: 2019-09-01

## 2019-08-25 NOTE — ED Provider Notes (Signed)
[facility]   [id number] 08/25/19 Arrival Time: 0930  CC: Shingles  SUBJECTIVE:  Teresa Vincent is a 39 y.o. female who presents with possible shingles rash x 3 days.  Denies precipitating event or trauma.  Localizes the rash to LT side of neck and upper back.  Describes it as painful and pins and needles.  Has tried OTC medications without relief.  Symptoms are made worse to the touch.  Reports similar symptoms in the past that improved with antiviral and pain medication.   Denies fever, chills, nausea, vomiting, erythema, swelling, SOB, chest pain.  ROS: As per HPI.  All other pertinent ROS negative.     Past Medical History:  Diagnosis Date  . Abnormal Pap smear of cervix   . ANXIETY 08/30/2007  . Asthma   . Chronic back pain   . Claustrophobia    pt requests Versed before surgery.  . Complication of anesthesia    hx of waking up during anesthesia  . GERD (gastroesophageal reflux disease)   . History of kidney stones   . Hypertension   . 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 INSOMNIA 08/30/2007  . MIGRAINE HEADACHE 08/30/2007  . Neck pain   . Palpitations    occasionally couple times a week  . Sciatica   . Sleep apnea    cpap  . Stroke [facility])    states, no deficits and on no meds now.   Past Surgical History:  Procedure Laterality Date  . ABDOMINAL EXPOSURE N/A 05/29/2017   Procedure: ABDOMINAL EXPOSURE;  Surgeon: Chuck Hint, MD;  Location: [facility] OR;  Service: Vascular;  Laterality: N/A;  . ABDOMINAL HYSTERECTOMY    . ANTERIOR LUMBAR FUSION N/A 05/29/2017   Procedure: Anterior Lumbar Interbody Fusion  - Lumbar five sacral one;  Surgeon: Tia Alert, MD;  Location: [facility] OR;  Service: Neurosurgery;  Laterality: N/A;  . CARPAL TUNNEL RELEASE Bilateral   . CHOLECYSTECTOMY    . CHOLECYSTECTOMY N/A 12/29/2016   Procedure: LAPAROSCOPIC CHOLECYSTECTOMY;  Surgeon: [name], MD;  Location: [facility] ORS;  Service: General;  Laterality: N/A;  .  CYSTOSCOPY/URETEROSCOPY/HOLMIUM LASER/STENT PLACEMENT Right 06/25/2019   Procedure: CYSTOSCOPY WITH RIGHT RETROGRADE PYELOGRAM/RIGHT URETEROSCOPY/HOLMIUM LASER APPLICATION RIGHT URETERAL CALCULUS/RIGHT URETERAL STENT PLACEMENT;  Surgeon: Malen Gauze, MD;  Location: [facility] ORS;  Service: Urology;  Laterality: Right;  . EXTRACORPOREAL SHOCK WAVE LITHOTRIPSY Right 04/10/2019   Procedure: EXTRACORPOREAL SHOCK WAVE LITHOTRIPSY (ESWL);  Surgeon: Noel Christmas, MD;  Location: [facility];  Service: Urology;  Laterality: Right;  . LAMINECTOMY WITH POSTERIOR LATERAL ARTHRODESIS LEVEL 1 N/A 02/28/2018   Procedure: Laminectomy and Foraminotomy Lumbar Five-Sacral One, posterolateral fusion and fixation;  Surgeon: Tia Alert, MD;  Location: [facility] OR;  Service: Neurosurgery;  Laterality: N/A;  posterior  . LUMBAR EPIDURAL INJECTION    . STERIOD INJECTION N/A 01/10/2017   Procedure: MINOR EXPAREL INJECTION;  Surgeon: [name], MD;  Location: [facility];  Service: General;  Laterality: N/A;  . TOTAL ABDOMINAL HYSTERECTOMY W/ BILATERAL SALPINGOOPHORECTOMY N/A   . TUBAL LIGATION  2003   Allergies  Allergen Reactions  . Adhesive [Tape] Other (See Comments)    Skin irritation, patient prefers paper tape.   Budd Palmer [Citalopram Hydrobromide] Other (See Comments)    Make patient too carefree   Current Facility-Administered Medications on File Prior to Encounter  Medication Dose Route Frequency Provider Last Rate Last Admin  . methylPREDNISolone acetate (DEPO-MEDROL) injection 40 mg  40 mg Intra-articular Once Luking,  Grace Bushy, MD       Current Outpatient Medications on File Prior to Encounter  Medication Sig Dispense Refill  . ALPRAZolam (XANAX) 0.5 MG tablet TAKE 1 TABLET BY MOUTH ONCE DAILY AT BEDTIME AS NEEDED FOR SLEEP 30 tablet 5  . atorvastatin (LIPITOR) 20 MG tablet Take 1 tablet by mouth once daily 90 tablet 0  . cholestyramine (QUESTRAN) 4 GM/DOSE powder One scoop BID prn  diarrhea 378 g 3  . cyclobenzaprine (FLEXERIL) 10 MG tablet Take 1 tablet (10 mg total) by mouth 3 (three) times daily as needed. for muscle spams 90 tablet 1  . EUTHYROX 175 MCG tablet TAKE 1 TABLET BY MOUTH ONCE DAILY BEFORE BREAKFAST 90 tablet 0  . lisinopril (ZESTRIL) 10 MG tablet Take 1 tablet (10 mg total) by mouth daily. 30 tablet 2  . metoprolol tartrate (LOPRESSOR) 50 MG tablet Take 25 mg by mouth 2 (two) times daily.     . mirabegron ER (MYRBETRIQ) 25 MG TB24 tablet Take 1 tablet (25 mg total) by mouth daily. 30 tablet 0  . ondansetron (ZOFRAN-ODT) 4 MG disintegrating tablet Take one tablet every 8 hours prn 30 tablet 2  . oxyCODONE-acetaminophen (PERCOCET) 10-325 MG tablet Take 1 tablet by mouth every 4 (four) hours as needed for pain. 30 tablet 0  . pantoprazole (PROTONIX) 40 MG tablet TAKE 1 TABLET BY MOUTH ONCE DAILY 30  MINUTES  PRIOR  TO  A  MEAL 30 tablet 11  . tamsulosin (FLOMAX) 0.4 MG CAPS capsule Take 1 capsule (0.4 mg total) by mouth daily after supper. 30 capsule 0   Social History   Socioeconomic History  . Marital status: Single    Spouse name: Not on file  . Number of children: Not on file  . Years of education: Not on file  . Highest education level: Not on file  Occupational History  . Occupation: Doesn't work outside the home  Tobacco Use  . Smoking status: Former Smoker    Packs/day: 1.00    Years: 26.00    Pack years: 26.00    Types: Cigarettes    Quit date: 06/19/2019    Years since quitting: 0.1  . Smokeless tobacco: Former Network engineer and Sexual Activity  . Alcohol use: Yes    Alcohol/week: 0.0 standard drinks    Comment: occasional  . Drug use: Yes    Types: Marijuana    Comment: occasional  . Sexual activity: Not Currently    Birth control/protection: Surgical  Other Topics Concern  . Not on file  Social History Narrative  . Not on file   Social Determinants of Health   Financial Resource Strain:   . Difficulty of Paying Living  Expenses:   Food Insecurity:   . Worried About Charity fundraiser in the Last Year:   . Arboriculturist in the Last Year:   Transportation Needs:   . Film/video editor (Medical):   Marland Kitchen Lack of Transportation (Non-Medical):   Physical Activity:   . Days of Exercise per Week:   . Minutes of Exercise per Session:   Stress:   . Feeling of Stress :   Social Connections:   . Frequency of Communication with Friends and Family:   . Frequency of Social Gatherings with Friends and Family:   . Attends Religious Services:   . Active Member of Clubs or Organizations:   . Attends Archivist Meetings:   Marland Kitchen Marital Status:   Intimate Partner Violence:   .  Fear of Current or Ex-Partner:   . Emotionally Abused:   Marland Kitchen Physically Abused:   . Sexually Abused:    Family History  Problem Relation Age of Onset  . Hypertension Mother   . Thyroid disease Neg Hx     OBJECTIVE: Vitals:   08/25/19 0941  BP: 120/84  Pulse: 65  Resp: 18  Temp: 98.5 F (36.9 C)  SpO2: 98%    General appearance: alert; no distress Head: NCAT ENT: PERRL, EOMI grossly; oropharynx clear Lungs: clear to auscultation bilaterally Heart: regular rate and rhythm.  Extremities: no edema Skin: warm and dry; mild erythema to LT superior trapezius, TTP, no obvious rash, some ecchymosis over lateral RT and LT neck Psychological: alert and cooperative; normal mood and affect  ASSESSMENT & PLAN:  1. Rash and nonspecific skin eruption     Meds ordered this encounter  Medications  . valACYclovir (VALTREX) 1000 MG tablet    Sig: Take 1 tablet (1,000 mg total) by mouth 3 (three) times daily for 10 days.    Dispense:  30 tablet    Refill:  0    Order Specific Question:   Supervising Provider    Answer:   Eustace Moore [[id number]]  . predniSONE (STERAPRED UNI-PAK 21 TAB) 10 MG (21) TBPK tablet    Sig: Take by mouth daily. Take 6 tabs by mouth daily  for 2 days, then 5 tabs for 2 days, then 4 tabs for 2 days,  then 3 tabs for 2 days, 2 tabs for 2 days, then 1 tab by mouth daily for 2 days    Dispense:  42 tablet    Refill:  0    Order Specific Question:   Supervising Provider    Answer:   Eustace Moore [[id number]]  . dexamethasone (DECADRON) injection 10 mg   Rest and use ice/heat as needed for symptomatic relief Prescribed valacyclovir 1000mg  3x/day for 10 days Prescribed prednisone taper for inflammation and pain Use OTC medications such as ibuprofen/ tylenol.  Follow up with PCP in 7-10 days if rash is still present Follow up with PCP if symptoms of burning, stinging, tingling or numbness occur after rash resolves, you may need additional treatment Return here or go to ER if you have any new or worsening symptoms (such as eye involvement, severe pain, or signs of secondary infection such as fever, chills, nausea, vomiting, discharge, redness or warmth over site of rash)   Reviewed expectations re: course of current medical issues. Questions answered. Outlined signs and symptoms indicating need for more acute intervention. Patient verbalized understanding. After Visit Summary given.   9-10, PA-C 08/25/19 1007

## 2019-08-25 NOTE — ED Triage Notes (Signed)
Pt presents with c/o skin pain on neck and also headache that developed 3 days ago. No rash visable. Pt states she has had shingle on legs and feels the same

## 2019-08-25 NOTE — Discharge Instructions (Signed)
Rest and use ice/heat as needed for symptomatic relief Prescribed valacyclovir 1000mg 3x/day for 10 days Prescribed prednisone taper for inflammation and pain Use OTC medications such as ibuprofen/ tylenol.  Follow up with PCP in 7-10 days if rash is still present Follow up with PCP if symptoms of burning, stinging, tingling or numbness occur after rash resolves, you may need additional treatment Return here or go to ER if you have any new or worsening symptoms (such as eye involvement, severe pain, or signs of secondary infection such as fever, chills, nausea, vomiting, discharge, redness or warmth over site of rash) 

## 2019-08-25 NOTE — Telephone Encounter (Signed)
Pt contacted. Spoke with provider to see when pt would be able to come in due to limited appts. Provider reviewed over Urgent Care notes. Provider states that patient can use Lidocaine patches, Tylenol and Ibuprofen. Pt has Oxycodone 10 mg on med list but states she is no longer on any pain meds. Pt had shot at Urgent Care and Prednisone will take about 24 hours. Informed patient that shot and Prednisone would take about 24 hours to begin t work and to call office on Wednesday or Thursday if no better. Pt verbalized understanding.

## 2019-08-25 NOTE — Telephone Encounter (Signed)
Please advise. Thank you

## 2019-08-25 NOTE — Telephone Encounter (Signed)
Pt was seen at Urgent Care was unable to make appt here they are treating her for shingles give her antibiotic but pain meds will need to come from Dr Ladona Ridgel.

## 2019-08-25 NOTE — Telephone Encounter (Signed)
Needs appt. Dr. Graeson Nouri  

## 2019-08-26 LAB — T4, FREE: Free T4: 1.7 ng/dL (ref 0.8–1.8)

## 2019-08-26 LAB — HEMOGLOBIN A1C
Hgb A1c MFr Bld: 5.6 % of total Hgb (ref <5.7)
Mean Plasma Glucose: 114 (calc)
eAG (mmol/L): 6.3 (calc)

## 2019-08-26 LAB — TSH: TSH: 0.13 mIU/L — ABNORMAL LOW

## 2019-09-01 ENCOUNTER — Other Ambulatory Visit: Payer: Self-pay

## 2019-09-01 ENCOUNTER — Ambulatory Visit (INDEPENDENT_AMBULATORY_CARE_PROVIDER_SITE_OTHER): Payer: 59 | Admitting: "Endocrinology

## 2019-09-01 ENCOUNTER — Encounter: Payer: Self-pay | Admitting: "Endocrinology

## 2019-09-01 VITALS — BP 105/71 | HR 59 | Ht 67.0 in | Wt 225.0 lb

## 2019-09-01 DIAGNOSIS — R7303 Prediabetes: Secondary | ICD-10-CM

## 2019-09-01 DIAGNOSIS — E782 Mixed hyperlipidemia: Secondary | ICD-10-CM | POA: Diagnosis not present

## 2019-09-01 DIAGNOSIS — E890 Postprocedural hypothyroidism: Secondary | ICD-10-CM | POA: Diagnosis not present

## 2019-09-01 MED ORDER — LEVOTHYROXINE SODIUM 150 MCG PO TABS: 150.0000 ug | ORAL_TABLET | Freq: Every day | ORAL | 1 refills | Status: DC

## 2019-09-01 NOTE — Progress Notes (Signed)
09/01/2019      Endocrinology follow-up note   Subjective:    Patient ID: Teresa Vincent, female    DOB: [birth year], PCP [name], MD  Past Medical History:  Diagnosis Date  . Abnormal Pap smear of cervix   . ANXIETY 08/30/2007  . Asthma   . Chronic back pain   . Claustrophobia    pt requests Versed before surgery.  . Complication of anesthesia    hx of waking up during anesthesia  . GERD (gastroesophageal reflux disease)   . History of kidney stones   . Hypertension   . 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 INSOMNIA 08/30/2007  . MIGRAINE HEADACHE 08/30/2007  . Neck pain   . Palpitations    occasionally couple times a week  . Sciatica   . Sleep apnea    cpap  . Stroke [facility])    states, no deficits and on no meds now.      Past Surgical History:  Procedure Laterality Date  . ABDOMINAL EXPOSURE N/A 05/29/2017   Procedure: ABDOMINAL EXPOSURE;  Surgeon: Chuck Hint, MD;  Location: [facility] OR;  Service: Vascular;  Laterality: N/A;  . ABDOMINAL HYSTERECTOMY    . ANTERIOR LUMBAR FUSION N/A 05/29/2017   Procedure: Anterior Lumbar Interbody Fusion  - Lumbar five sacral one;  Surgeon: Tia Alert, MD;  Location: [facility] OR;  Service: Neurosurgery;  Laterality: N/A;  . CARPAL TUNNEL RELEASE Bilateral   . CHOLECYSTECTOMY    . CHOLECYSTECTOMY N/A 12/29/2016   Procedure: LAPAROSCOPIC CHOLECYSTECTOMY;  Surgeon: [name], MD;  Location: [facility] ORS;  Service: General;  Laterality: N/A;  . CYSTOSCOPY/URETEROSCOPY/HOLMIUM LASER/STENT PLACEMENT Right 06/25/2019   Procedure: CYSTOSCOPY WITH RIGHT RETROGRADE PYELOGRAM/RIGHT URETEROSCOPY/HOLMIUM LASER APPLICATION RIGHT URETERAL CALCULUS/RIGHT URETERAL STENT PLACEMENT;  Surgeon: Malen Gauze, MD;  Location: [facility];  Service: Urology;  Laterality: Right;  . EXTRACORPOREAL SHOCK WAVE LITHOTRIPSY Right 04/10/2019   Procedure: EXTRACORPOREAL SHOCK WAVE LITHOTRIPSY (ESWL);  Surgeon: Noel Christmas, MD;   Location: [facility];  Service: Urology;  Laterality: Right;  . LAMINECTOMY WITH POSTERIOR LATERAL ARTHRODESIS LEVEL 1 N/A 02/28/2018   Procedure: Laminectomy and Foraminotomy Lumbar Five-Sacral One, posterolateral fusion and fixation;  Surgeon: Tia Alert, MD;  Location: [facility] OR;  Service: Neurosurgery;  Laterality: N/A;  posterior  . LUMBAR EPIDURAL INJECTION    . STERIOD INJECTION N/A 01/10/2017   Procedure: MINOR EXPAREL INJECTION;  Surgeon: [name], MD;  Location: [facility];  Service: General;  Laterality: N/A;  . TOTAL ABDOMINAL HYSTERECTOMY W/ BILATERAL SALPINGOOPHORECTOMY N/A   . TUBAL LIGATION  2003     Social Connections:   . Frequency of Communication with Friends and Family:   . Frequency of Social Gatherings with Friends and Family:   . Attends Religious Services:   . Active Member of Clubs or Organizations:   . Attends Banker Meetings:   Marland Kitchen Marital Status:       Social Connections:   . Frequency of Communication with Friends and Family:   . Frequency of Social Gatherings with Friends and Family:   . Attends Religious Services:   . Active Member of Clubs or Organizations:   . Attends Banker Meetings:   Marland Kitchen Marital Status:    Social History   Socioeconomic History  . Marital status: Single    Spouse name: Not on file  . Number of children: Not on file  . Years of education: Not on file  . Highest  education level: Not on file  Occupational History  . Occupation: Doesn't work outside the home  Tobacco Use  . Smoking status: Former Smoker    Packs/day: 1.00    Years: 26.00    Pack years: 26.00    Types: Cigarettes    Quit date: 06/19/2019    Years since quitting: 0.2  . Smokeless tobacco: Former Clinical biochemist  . Vaping Use: Every day  Substance and Sexual Activity  . Alcohol use: Yes    Alcohol/week: 0.0 standard drinks    Comment: occasional  . Drug use: Yes    Types: Marijuana    Comment: occasional   . Sexual activity: Not Currently    Birth control/protection: Surgical  Other Topics Concern  . Not on file  Social History Narrative  . Not on file   Social Determinants of Health   Financial Resource Strain:   . Difficulty of Paying Living Expenses:   Food Insecurity:   . Worried About Programme researcher, broadcasting/film/video in the Last Year:   . Barista in the Last Year:   Transportation Needs:   . Freight forwarder (Medical):   Marland Kitchen Lack of Transportation (Non-Medical):   Physical Activity:   . Days of Exercise per Week:   . Minutes of Exercise per Session:   Stress:   . Feeling of Stress :   Social Connections:   . Frequency of Communication with Friends and Family:   . Frequency of Social Gatherings with Friends and Family:   . Attends Religious Services:   . Active Member of Clubs or Organizations:   . Attends Banker Meetings:   Marland Kitchen Marital Status:   Intimate Partner Violence:   . Fear of Current or Ex-Partner:   . Emotionally Abused:   Marland Kitchen Physically Abused:   . Sexually Abused:      Current Outpatient Medications  Medication Instructions  . ALPRAZolam (XANAX) 0.5 MG tablet TAKE 1 TABLET BY MOUTH ONCE DAILY AT BEDTIME AS NEEDED FOR SLEEP  . atorvastatin (LIPITOR) 20 MG tablet Take 1 tablet by mouth once daily  . cholestyramine (QUESTRAN) 4 GM/DOSE powder One scoop BID prn diarrhea  . cyclobenzaprine (FLEXERIL) 10 mg, Oral, 3 times daily PRN, for muscle spams  . levothyroxine (EUTHYROX) 150 mcg, Oral, Daily before breakfast  . lisinopril (ZESTRIL) 10 mg, Oral, Daily  . metoprolol tartrate (LOPRESSOR) 25 mg, Oral, 2 times daily  . ondansetron (ZOFRAN-ODT) 4 MG disintegrating tablet Take one tablet every 8 hours prn  . oxyCODONE-acetaminophen (PERCOCET) 10-325 MG tablet 1 tablet, Oral, Every 4 hours PRN  . pantoprazole (PROTONIX) 40 MG tablet TAKE 1 TABLET BY MOUTH ONCE DAILY 30  MINUTES  PRIOR  TO  A  MEAL    Facility-Administered Encounter Medications as of  06/11/2018  Medication  . methylPREDNISolone acetate (DEPO-MEDROL) injection 40 mg   ALLERGIES: Allergies  Allergen Reactions  . Adhesive [Tape] Other (See Comments)    Skin irritation, patient prefers paper tape.   Budd Palmer [Citalopram Hydrobromide] Other (See Comments)    Make patient too carefree   VACCINATION STATUS: Immunization History  Administered Date(s) Administered  . Influenza-Unspecified 12/11/2015, 12/13/2016  . Td 05/11/2006  . Tdap 12/13/2016    HPI  Ms. Ordway is a 39 year old female patient with medical history as above.  She is here to follow-up for hypothyroidism related to remote past I-131 thyroid ablation.   Her history is significant for treatment for Graves' disease with RAI on  2 occasions, in June 2009 and on 01/06/2009. She was treated with various doses of levothyroxine over the years currently on levothyroxine 175 mcg p.o. every morning.  She reports compliance and more consistent taking her medication.   She reports an anxiety, intermittent palpitations, mild tremors.    -She also has hyperlipidemia on atorvastatin , prediabetes with A1c of 5.6%, and hypertension on lisinopril.  -She denies any family history of thyroid dysfunction. She denies any personal history of goiter nor family history of thyroid cancer.     Objective:     Recent Results (from the past 2160 hour(s))  POCT urinalysis dipstick     Status: Abnormal   Collection Time: 06/04/19 11:02 AM  Result Value Ref Range   Color, UA amber    Clarity, UA cloudy    Glucose, UA Negative Negative   Bilirubin, UA negative    Ketones, UA negative    Spec Grav, UA 1.025 1.010 - 1.025   Blood, UA large    pH, UA 6.0 5.0 - 8.0   Protein, UA Positive (A) Negative   Urobilinogen, UA 0.2 0.2 or 1.0 E.U./dL   Nitrite, UA negative    Leukocytes, UA Negative Negative   Appearance     Odor    Urinalysis, Routine w reflex microscopic     Status: Abnormal   Collection Time: 06/05/19  8:04  AM  Result Value Ref Range   Color, Urine YELLOW YELLOW   APPearance CLOUDY (A) CLEAR   Specific Gravity, Urine 1.026 1.005 - 1.030   pH 5.0 5.0 - 8.0   Glucose, UA NEGATIVE NEGATIVE mg/dL   Hgb urine dipstick LARGE (A) NEGATIVE   Bilirubin Urine NEGATIVE NEGATIVE   Ketones, ur 5 (A) NEGATIVE mg/dL   Protein, ur 818 (A) NEGATIVE mg/dL   Nitrite NEGATIVE NEGATIVE   Leukocytes,Ua NEGATIVE NEGATIVE   RBC / HPF >50 (H) 0 - 5 RBC/hpf   WBC, UA 11-20 0 - 5 WBC/hpf   Bacteria, UA RARE (A) NONE SEEN   Squamous Epithelial / LPF 0-5 0 - 5   Mucus PRESENT    Hyaline Casts, UA PRESENT    Uric Acid Crys, UA PRESENT     Comment: Performed at [facility], [address] [place], [place] [address]  CBC with Differential     Status: None   Collection Time: 06/05/19  8:08 AM  Result Value Ref Range   WBC 8.0 4.0 - 10.5 K/uL   RBC 4.47 3.87 - 5.11 MIL/uL   Hemoglobin 13.8 12.0 - 15.0 g/dL   HCT 97.0 36 - 46 %   MCV 91.9 80.0 - 100.0 fL   MCH 30.9 26.0 - 34.0 pg   MCHC 33.6 30.0 - 36.0 g/dL   RDW 26.3 78.5 - 88.5 %   Platelets 263 150 - 400 K/uL   nRBC 0.0 0.0 - 0.2 %   Neutrophils Relative % 56 %   Neutro Abs 4.6 1.7 - 7.7 K/uL   Lymphocytes Relative 33 %   Lymphs Abs 2.6 0.7 - 4.0 K/uL   Monocytes Relative 8 %   Monocytes Absolute 0.6 0 - 1 K/uL   Eosinophils Relative 2 %   Eosinophils Absolute 0.2 0 - 0 K/uL   Basophils Relative 1 %   Basophils Absolute 0.0 0 - 0 K/uL   Immature Granulocytes 0 %   Abs Immature Granulocytes 0.02 0.00 - 0.07 K/uL    Comment: Performed at [facility], [address], [place], [place]  [address]  Basic metabolic panel     Status: Abnormal   Collection Time: 06/05/19  8:08 AM  Result Value Ref Range   Sodium 137 135 - 145 mmol/L   Potassium 4.0 3.5 - 5.1 mmol/L   Chloride 104 98 - 111 mmol/L   CO2 24 22 - 32 mmol/L   Glucose, Bld 144 (H) 70 - 99 mg/dL    Comment: Glucose reference range applies only to samples taken after fasting for at least 8  hours.   BUN 14 6 - 20 mg/dL   Creatinine, Ser 1.91 0.44 - 1.00 mg/dL   Calcium 9.0 8.9 - 47.8 mg/dL   GFR calc non Af Amer >60 >60 mL/min   GFR calc Af Amer >60 >60 mL/min   Anion gap 9 5 - 15    Comment: Performed at [facility], [address], [place], [place] [address]  CBC with Differential/Platelet     Status: Abnormal   Collection Time: 06/09/19  3:07 AM  Result Value Ref Range   WBC 10.7 (H) 4.0 - 10.5 K/uL   RBC 4.24 3.87 - 5.11 MIL/uL   Hemoglobin 13.1 12.0 - 15.0 g/dL   HCT 13.0 36 - 46 %   MCV 94.3 80.0 - 100.0 fL   MCH 30.9 26.0 - 34.0 pg   MCHC 32.8 30.0 - 36.0 g/dL   RDW 86.5 78.4 - 69.6 %   Platelets 250 150 - 400 K/uL   nRBC 0.0 0.0 - 0.2 %   Neutrophils Relative % 65 %   Neutro Abs 6.9 1.7 - 7.7 K/uL   Lymphocytes Relative 24 %   Lymphs Abs 2.6 0.7 - 4.0 K/uL   Monocytes Relative 9 %   Monocytes Absolute 1.0 0 - 1 K/uL   Eosinophils Relative 2 %   Eosinophils Absolute 0.2 0 - 0 K/uL   Basophils Relative 0 %   Basophils Absolute 0.0 0 - 0 K/uL   Immature Granulocytes 0 %   Abs Immature Granulocytes 0.03 0.00 - 0.07 K/uL    Comment: Performed at [facility], [address], [place], [place] [address]  Basic metabolic panel     Status: Abnormal   Collection Time: 06/09/19  3:07 AM  Result Value Ref Range   Sodium 136 135 - 145 mmol/L   Potassium 3.8 3.5 - 5.1 mmol/L   Chloride 105 98 - 111 mmol/L   CO2 25 22 - 32 mmol/L   Glucose, Bld 131 (H) 70 - 99 mg/dL    Comment: Glucose reference range applies only to samples taken after fasting for at least 8 hours.   BUN 16 6 - 20 mg/dL   Creatinine, Ser 4.13 0.44 - 1.00 mg/dL   Calcium 8.8 (L) 8.9 - 10.3 mg/dL   GFR calc non Af Amer >60 >60 mL/min   GFR calc Af Amer >60 >60 mL/min   Anion gap 6 5 - 15    Comment: Performed at [facility], [address], [place], [place] [address]  Urinalysis, Routine w reflex microscopic     Status: Abnormal   Collection Time: 06/09/19  3:13 AM  Result Value Ref Range    Color, Urine YELLOW YELLOW   APPearance HAZY (A) CLEAR   Specific Gravity, Urine 1.020 1.005 - 1.030   pH 5.0 5.0 - 8.0   Glucose, UA NEGATIVE NEGATIVE mg/dL   Hgb urine dipstick SMALL (A) NEGATIVE   Bilirubin Urine NEGATIVE NEGATIVE   Ketones, ur NEGATIVE NEGATIVE mg/dL  Protein, ur NEGATIVE NEGATIVE mg/dL   Nitrite NEGATIVE NEGATIVE   Leukocytes,Ua NEGATIVE NEGATIVE   RBC / HPF 11-20 0 - 5 RBC/hpf   WBC, UA 0-5 0 - 5 WBC/hpf   Bacteria, UA RARE (A) NONE SEEN   Squamous Epithelial / LPF 0-5 0 - 5   Mucus PRESENT     Comment: Performed at [facility], [address] [place], [place] [address]  Pregnancy, urine     Status: None   Collection Time: 06/09/19  3:13 AM  Result Value Ref Range   Preg Test, Ur NEGATIVE NEGATIVE    Comment:        THE SENSITIVITY OF THIS METHODOLOGY IS >20 mIU/mL. Performed at [facility], [address], [place], [place] [address]   SARS CORONAVIRUS 2 (TAT 6-24 HRS) Nasopharyngeal Nasopharyngeal Swab     Status: None   Collection Time: 06/23/19  7:10 AM   Specimen: Nasopharyngeal Swab  Result Value Ref Range   SARS Coronavirus 2 NEGATIVE NEGATIVE    Comment: (NOTE) SARS-CoV-2 target nucleic acids are NOT DETECTED. The SARS-CoV-2 RNA is generally detectable in upper and lower respiratory specimens during the acute phase of infection. Negative results do not preclude SARS-CoV-2 infection, do not rule out co-infections with other pathogens, and should not be used as the sole basis for treatment or other patient management decisions. Negative results must be combined with clinical observations, patient history, and epidemiological information. The expected result is Negative. Fact Sheet for Patients: HairSlick.no Fact Sheet for Healthcare Providers: quierodirigir.com This test is not yet approved or cleared by the [place] FDA and  has been authorized for detection and/or diagnosis of  SARS-CoV-2 by FDA under an Emergency Use Authorization (EUA). This EUA will remain  in effect (meaning this test can be used) for the duration of the COVID-19 declaration under Section 56 4(b)(1) of the Act, 21 U.S.C. section 360bbb-3(b)(1), unless the authorization is terminated or revoked sooner. Performed at [facility], [address], [place], [place] [address]   Calculi, with Photograph (to Clinical Lab)     Status: None   Collection Time: 06/25/19  8:13 AM  Result Value Ref Range   Source Calculi Comment     Comment: Right Ureter CORRECTED ON 04/13 AT 2235: PREVIOUSLY REPORTED AS KIDNEY STONE    Color Calculi Brown    Size Calculi 2x2 mm    Comment: (NOTE) Multiple pieces received.  Dimensions of the largest piece reported.    Weight Calculi 23 mg   Composition Calculi Comment     Comment: Percentage (Represents the % composition)   Calcium Oxalate Monohydrate 25 %   Calcium Oxalate Dihydrate 70 %   Hydroxyapatite 5 %   Photo Calculi Comment     Comment: Photograph will follow under a separate cover   Comment Calculi 3 Comment     Comment: (NOTE) Physician questions regarding Calculi Analysis contact [facility] at: [phone].    Please Note: Comment     Comment: (NOTE) Calculi report will follow via computer, mail or courier delivery.    DISCLAIMER: Comment     Comment: (NOTE) This test was developed and its performance characteristics determined by [facility].  It has not been cleared or approved by the Food and Drug Administration. Performed At: [facility] [address] [place], [place] [id number] Doroteo Bradford MD VO:[id number]   CBC with Differential     Status: Abnormal   Collection Time: 06/26/19 10:37 AM  Result Value Ref Range   WBC 19.5 (H) 4.0 - 10.5 K/uL   RBC 4.46 3.87 - 5.11 MIL/uL   Hemoglobin 13.9 12.0 - 15.0 g/dL   HCT 41.4 36 - 46 %   MCV 92.8 80.0 - 100.0 fL   MCH 31.2 26.0 - 34.0 pg   MCHC 33.6 30.0 - 36.0  g/dL   RDW 12.8 11.5 - 15.5 %   Platelets 299 150 - 400 K/uL   nRBC 0.0 0.0 - 0.2 %   Neutrophils Relative % 77 %   Neutro Abs 15.2 (H) 1.7 - 7.7 K/uL   Lymphocytes Relative 15 %   Lymphs Abs 2.9 0.7 - 4.0 K/uL   Monocytes Relative 7 %   Monocytes Absolute 1.3 (H) 0 - 1 K/uL   Eosinophils Relative 0 %   Eosinophils Absolute 0.0 0 - 0 K/uL   Basophils Relative 0 %   Basophils Absolute 0.0 0 - 0 K/uL   Immature Granulocytes 1 %   Abs Immature Granulocytes 0.11 (H) 0.00 - 0.07 K/uL    Comment: Performed at [facility], [address] [place], [place] [address]  Comprehensive metabolic panel     Status: Abnormal   Collection Time: 06/26/19 10:37 AM  Result Value Ref Range   Sodium 137 135 - 145 mmol/L   Potassium 4.2 3.5 - 5.1 mmol/L   Chloride 106 98 - 111 mmol/L   CO2 22 22 - 32 mmol/L   Glucose, Bld 106 (H) 70 - 99 mg/dL    Comment: Glucose reference range applies only to samples taken after fasting for at least 8 hours.   BUN 19 6 - 20 mg/dL   Creatinine, Ser 0.87 0.44 - 1.00 mg/dL   Calcium 9.4 8.9 - 10.3 mg/dL   Total Protein 7.2 6.5 - 8.1 g/dL   Albumin 4.3 3.5 - 5.0 g/dL   AST 21 15 - 41 U/L   ALT 17 0 - 44 U/L   Alkaline Phosphatase 57 38 - 126 U/L   Total Bilirubin 0.5 0.3 - 1.2 mg/dL   GFR calc non Af Amer >60 >60 mL/min   GFR calc Af Amer >60 >60 mL/min   Anion gap 9 5 - 15    Comment: Performed at [facility], [address] [place], [place] [address]  Urinalysis, Routine w reflex microscopic     Status: Abnormal   Collection Time: 06/26/19 10:51 AM  Result Value Ref Range   Color, Urine RED (A) YELLOW    Comment: BIOCHEMICALS MAY BE AFFECTED BY COLOR   APPearance TURBID (A) CLEAR   Specific Gravity, Urine  1.005 - 1.030    TEST NOT REPORTED DUE TO COLOR INTERFERENCE OF URINE PIGMENT   pH  5.0 - 8.0    TEST NOT REPORTED DUE TO COLOR INTERFERENCE OF URINE PIGMENT   Glucose, UA (A) NEGATIVE mg/dL    TEST NOT REPORTED DUE TO COLOR INTERFERENCE OF URINE  PIGMENT   Hgb urine dipstick (A) NEGATIVE    TEST NOT REPORTED DUE TO COLOR INTERFERENCE OF URINE PIGMENT   Bilirubin Urine (A) NEGATIVE    TEST NOT REPORTED DUE TO COLOR INTERFERENCE OF URINE PIGMENT   Ketones, ur (A) NEGATIVE mg/dL    TEST NOT REPORTED DUE TO COLOR INTERFERENCE OF URINE PIGMENT   Protein, ur (A) NEGATIVE mg/dL    TEST NOT REPORTED DUE TO COLOR INTERFERENCE OF URINE PIGMENT   Nitrite (A) NEGATIVE    TEST NOT REPORTED DUE TO COLOR  INTERFERENCE OF URINE PIGMENT   Leukocytes,Ua (A) NEGATIVE    TEST NOT REPORTED DUE TO COLOR INTERFERENCE OF URINE PIGMENT    Comment: Performed at [facility], [address] [place], [place] [address]  Urinalysis, Microscopic (reflex)     Status: Abnormal   Collection Time: 06/26/19 10:51 AM  Result Value Ref Range   RBC / HPF >50 0 - 5 RBC/hpf   WBC, UA 0-5 0 - 5 WBC/hpf   Bacteria, UA FEW (A) NONE SEEN   Squamous Epithelial / LPF NONE SEEN 0 - 5    Comment: Performed at [facility], [address] [place], [place] [address]  Urine culture     Status: None   Collection Time: 06/26/19 12:42 PM   Specimen: Urine, Clean Catch  Result Value Ref Range   Specimen Description      URINE, CLEAN CATCH Performed at [facility], [address] [place], [place] [address]    Special Requests      NONE Performed at [facility], [address], [place], [place] [address]    Culture      NO GROWTH Performed at [facility], [address], [place], [place] [address]    Report Status 06/28/2019 FINAL   T4, Free     Status: None   Collection Time: 08/25/19  9:08 AM  Result Value Ref Range   Free T4 1.7 0.8 - 1.8 ng/dL  Hemoglobin V9D     Status: None   Collection Time: 08/25/19  9:08 AM  Result Value Ref Range   Hgb A1c MFr Bld 5.6 <5.7 % of total Hgb    Comment: For the purpose of screening for the presence of diabetes: . <5.7%       Consistent with the absence of diabetes 5.7-6.4%    Consistent with increased risk for  diabetes             (prediabetes) > or =6.5%  Consistent with diabetes . This assay result is consistent with a decreased risk of diabetes. . Currently, no consensus exists regarding use of hemoglobin A1c for diagnosis of diabetes in children. . According to American Diabetes Association (ADA) guidelines, hemoglobin A1c <7.0% represents optimal control in non-pregnant diabetic patients. Different metrics may apply to specific patient populations.  Standards of Medical Care in Diabetes(ADA). .    Mean Plasma Glucose 114 (calc)   eAG (mmol/L) 6.3 (calc)  TSH     Status: Abnormal   Collection Time: 08/25/19  9:08 AM  Result Value Ref Range   TSH 0.13 (L) mIU/L    Comment:           Reference Range .           > or = 20 Years  0.40-4.50 .                Pregnancy Ranges           First trimester    0.26-2.66           Second trimester   0.55-2.73           Third trimester    0.43-2.91    Assessment & Plan:   1. 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  I discussed and lowered her levothyroxine to 150 mcg p.o. daily before breakfast.    - We discussed about the correct intake of her thyroid hormone, on empty stomach at fasting, with water,  separated by at least 30 minutes from breakfast and other medications,  and separated by more than 4 hours from calcium, iron, multivitamins, acid reflux medications (PPIs). -Patient is made aware of the fact that thyroid hormone replacement is needed for life, dose to be adjusted by periodic monitoring of thyroid function tests.  2. Hyperlipidemia-  -She has responded significantly for atorvastatin treatment, with improving LDL.  She is advised to continue atorvastatin 20 mg p.o. nightly.   She will have fasting lipid panel along with her next labs.   3) prediabetes-A1c of 5.6% -She is not on medications.  She was previously counseled on diet and exercise.   - I advised patient  to maintain close follow up with Merlyn AlbertLuking, William S, MD for primary care needs.     - Time spent on this patient care encounter:  20 minutes of which 50% was spent in  counseling and the rest reviewing  her current and  previous labs / studies and medications  doses and developing a plan for long term care. Bartholome BillCarolyn J Barreira  participated in the discussions, expressed understanding, and voiced agreement with the above plans.  All questions were answered to her satisfaction. she is encouraged to contact clinic should she have any questions or concerns prior to her return visit.   Marquis LunchGebre Colston Pyle, MD Phone: [phone]  Fax: [phone]  -  This note was partially dictated with voice recognition software. Similar sounding words can be transcribed inadequately or may not  be corrected upon review.  06/11/2018, 2:32 PM

## 2019-09-19 ENCOUNTER — Other Ambulatory Visit (HOSPITAL_COMMUNITY): Payer: Self-pay | Admitting: Sports Medicine

## 2019-09-19 ENCOUNTER — Other Ambulatory Visit: Payer: Self-pay | Admitting: Sports Medicine

## 2019-09-19 DIAGNOSIS — M25562 Pain in left knee: Secondary | ICD-10-CM

## 2019-10-15 ENCOUNTER — Other Ambulatory Visit: Payer: Self-pay

## 2019-10-15 ENCOUNTER — Ambulatory Visit (HOSPITAL_COMMUNITY)
Admission: RE | Admit: 2019-10-15 | Discharge: 2019-10-15 | Disposition: A | Discharge status: Home / Self Care | Payer: 59 | Source: Ambulatory Visit | Attending: Sports Medicine | Admitting: Sports Medicine

## 2019-10-15 DIAGNOSIS — M25562 Pain in left knee: Secondary | ICD-10-CM | POA: Diagnosis present

## 2019-10-16 ENCOUNTER — Ambulatory Visit (HOSPITAL_COMMUNITY)
Admission: RE | Admit: 2019-10-16 | Discharge: 2019-10-16 | Disposition: A | Discharge status: Home / Self Care | Payer: 59 | Source: Ambulatory Visit | Attending: Sports Medicine | Admitting: Sports Medicine

## 2019-10-16 ENCOUNTER — Other Ambulatory Visit: Payer: Self-pay | Admitting: Sports Medicine

## 2019-10-16 ENCOUNTER — Other Ambulatory Visit (HOSPITAL_COMMUNITY): Payer: Self-pay | Admitting: Sports Medicine

## 2019-10-16 DIAGNOSIS — M79662 Pain in left lower leg: Secondary | ICD-10-CM

## 2019-10-16 DIAGNOSIS — M79605 Pain in left leg: Secondary | ICD-10-CM

## 2019-11-05 ENCOUNTER — Other Ambulatory Visit: Payer: Self-pay | Admitting: "Endocrinology

## 2019-11-18 ENCOUNTER — Telehealth: Payer: Self-pay | Admitting: Oncology

## 2019-11-18 ENCOUNTER — Encounter: Payer: Self-pay | Admitting: Oncology

## 2019-11-18 NOTE — Telephone Encounter (Signed)
Called to Discuss with patient about Covid symptoms and the use of regeneron, a monoclonal antibody infusion for those with mild to moderate Covid symptoms and at a high risk of hospitalization.     Pt is qualified for this infusion at the [facility] infusion center due to co-morbid conditions and/or a member of an at-risk group.     Spoke to patient about Mab infusion.  Information given.  Daughter tested positive for Covid this morning.  She is getting a rapid Covid test from [facility] as we speak.  She will call back if she test positive for possible Mab infusion.  Past Medical History:  Diagnosis Date  . Abnormal Pap smear of cervix   . ANXIETY 08/30/2007  . Asthma   . Chronic back pain   . Claustrophobia    pt requests Versed before surgery.  . Complication of anesthesia    hx of waking up during anesthesia  . GERD (gastroesophageal reflux disease)   . History of kidney stones   . Hypertension   . 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 INSOMNIA 08/30/2007  . MIGRAINE HEADACHE 08/30/2007  . Neck pain   . Palpitations    occasionally couple times a week  . Sciatica   . Sleep apnea    cpap  . Stroke [facility])    states, no deficits and on no meds now.   Mignon Pine, AGNP-C [phone] (Infusion Center Hotline)

## 2019-11-26 ENCOUNTER — Ambulatory Visit (INDEPENDENT_AMBULATORY_CARE_PROVIDER_SITE_OTHER): Payer: 59

## 2019-11-26 ENCOUNTER — Ambulatory Visit (INDEPENDENT_AMBULATORY_CARE_PROVIDER_SITE_OTHER): Payer: 59 | Admitting: Orthopaedic Surgery

## 2019-11-26 ENCOUNTER — Encounter: Payer: Self-pay | Admitting: Orthopaedic Surgery

## 2019-11-26 ENCOUNTER — Other Ambulatory Visit: Payer: Self-pay

## 2019-11-26 DIAGNOSIS — G8929 Other chronic pain: Secondary | ICD-10-CM

## 2019-11-26 DIAGNOSIS — M25562 Pain in left knee: Secondary | ICD-10-CM

## 2019-11-26 MED ORDER — BUPIVACAINE HCL 0.5 % IJ SOLN
2.0000 mL | INTRAMUSCULAR | Status: AC | PRN
  Administered 2019-11-26: 2 mL via INTRA_ARTICULAR

## 2019-11-26 MED ORDER — METHYLPREDNISOLONE ACETATE 40 MG/ML IJ SUSP
80.0000 mg | INTRAMUSCULAR | Status: AC | PRN
  Administered 2019-11-26: 80 mg via INTRA_ARTICULAR

## 2019-11-26 MED ORDER — LIDOCAINE HCL 1 % IJ SOLN
2.0000 mL | INTRAMUSCULAR | Status: AC | PRN
  Administered 2019-11-26: 2 mL
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
## 2019-12-04 ENCOUNTER — Ambulatory Visit: Payer: 59 | Admitting: Orthopaedic Surgery

## 2019-12-04 ENCOUNTER — Other Ambulatory Visit: Payer: Self-pay

## 2020-01-01 ENCOUNTER — Other Ambulatory Visit: Payer: Self-pay

## 2020-01-01 ENCOUNTER — Telehealth: Payer: Self-pay | Admitting: *Deleted

## 2020-01-01 ENCOUNTER — Encounter: Payer: Self-pay | Admitting: Internal Medicine

## 2020-01-01 ENCOUNTER — Ambulatory Visit (INDEPENDENT_AMBULATORY_CARE_PROVIDER_SITE_OTHER): Payer: 59 | Admitting: Internal Medicine

## 2020-01-01 VITALS — BP 119/76 | HR 64 | Temp 97.5°F | Ht 67.0 in | Wt 240.0 lb

## 2020-01-01 DIAGNOSIS — K219 Gastro-esophageal reflux disease without esophagitis: Secondary | ICD-10-CM | POA: Diagnosis not present

## 2020-01-01 DIAGNOSIS — K625 Hemorrhage of anus and rectum: Secondary | ICD-10-CM

## 2020-01-01 MED ORDER — PANTOPRAZOLE SODIUM 40 MG PO TBEC
40.0000 mg | DELAYED_RELEASE_TABLET | Freq: Two times a day (BID) | ORAL | 11 refills | Status: DC
Start: 2020-01-01 — End: 2020-02-17

## 2020-01-01 NOTE — Progress Notes (Signed)
Referring Provider: Annalee Genta, DO Primary Care Physician:  Annalee Genta, DO Primary GI:  Dr. Marletta Lor  Chief Complaint  Patient presents with  . Rectal Bleeding    hemorrhoids    HPI:   Teresa Vincent is a 39 y.o. female who presents to clinic today for follow-up visit.  She has multiple GI complaints for me.  She notes over the last 1 to 2 months she has had worsening rectal bleeding.  Primarily with bowel movements but also will "leak out" during urination as well.  She is post undergo colonoscopy by Dr. Darrick Penna 2 years ago for similar issues but had back surgery which delayed things.  Denies any rectal pain or discomfort.  No family history of colorectal malignancy.  Also has had worsening acid reflux despite PPI therapy with Protonix 40 mg daily.  States she will have nearly daily symptoms that are worse depending on what she eats.  No dysphagia or odynophagia.  No chronic NSAID use.  No history of PUD or H. pylori that she is aware of.  Past Medical History:  Diagnosis Date  . Abnormal Pap smear of cervix   . ANXIETY 08/30/2007  . Asthma   . Chronic back pain   . Claustrophobia    pt requests Versed before surgery.  . Complication of anesthesia    hx of waking up during anesthesia  . GERD (gastroesophageal reflux disease)   . History of kidney stones   . Hypertension   . 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 INSOMNIA 08/30/2007  . MIGRAINE HEADACHE 08/30/2007  . Neck pain   . Palpitations    occasionally couple times a week  . Sciatica   . Sleep apnea    cpap  . Stroke [facility])    states, no deficits and on no meds now.    Past Surgical History:  Procedure Laterality Date  . ABDOMINAL EXPOSURE N/A 05/29/2017   Procedure: ABDOMINAL EXPOSURE;  Surgeon: Chuck Hint, MD;  Location: [facility] OR;  Service: Vascular;  Laterality: N/A;  . ABDOMINAL HYSTERECTOMY    . ANTERIOR LUMBAR FUSION N/A 05/29/2017   Procedure: Anterior Lumbar Interbody Fusion  -  Lumbar five sacral one;  Surgeon: Tia Alert, MD;  Location: [facility] OR;  Service: Neurosurgery;  Laterality: N/A;  . CARPAL TUNNEL RELEASE Bilateral   . CHOLECYSTECTOMY    . CHOLECYSTECTOMY N/A 12/29/2016   Procedure: LAPAROSCOPIC CHOLECYSTECTOMY;  Surgeon: [name], MD;  Location: [facility];  Service: General;  Laterality: N/A;  . CYSTOSCOPY/URETEROSCOPY/HOLMIUM LASER/STENT PLACEMENT Right 06/25/2019   Procedure: CYSTOSCOPY WITH RIGHT RETROGRADE PYELOGRAM/RIGHT URETEROSCOPY/HOLMIUM LASER APPLICATION RIGHT URETERAL CALCULUS/RIGHT URETERAL STENT PLACEMENT;  Surgeon: Malen Gauze, MD;  Location: [facility];  Service: Urology;  Laterality: Right;  . EXTRACORPOREAL SHOCK WAVE LITHOTRIPSY Right 04/10/2019   Procedure: EXTRACORPOREAL SHOCK WAVE LITHOTRIPSY (ESWL);  Surgeon: Noel Christmas, MD;  Location: [facility];  Service: Urology;  Laterality: Right;  . LAMINECTOMY WITH POSTERIOR LATERAL ARTHRODESIS LEVEL 1 N/A 02/28/2018   Procedure: Laminectomy and Foraminotomy Lumbar Five-Sacral One, posterolateral fusion and fixation;  Surgeon: Tia Alert, MD;  Location: [facility] OR;  Service: Neurosurgery;  Laterality: N/A;  posterior  . LUMBAR EPIDURAL INJECTION    . STERIOD INJECTION N/A 01/10/2017   Procedure: MINOR EXPAREL INJECTION;  Surgeon: [name], MD;  Location: [facility];  Service: General;  Laterality: N/A;  . TOTAL ABDOMINAL HYSTERECTOMY W/ BILATERAL SALPINGOOPHORECTOMY N/A   . TUBAL LIGATION  2003  Current Outpatient Medications  Medication Sig Dispense Refill  . ALPRAZolam (XANAX) 0.5 MG tablet TAKE 1 TABLET BY MOUTH ONCE DAILY AT BEDTIME AS NEEDED FOR SLEEP 30 tablet 5  . atorvastatin (LIPITOR) 20 MG tablet Take 1 tablet by mouth once daily 90 tablet 0  . cholestyramine (QUESTRAN) 4 GM/DOSE powder One scoop BID prn diarrhea (Patient taking differently: as needed. One scoop BID prn diarrhea) 378 g 3  . cyclobenzaprine (FLEXERIL) 10 MG tablet Take 1 tablet (10  mg total) by mouth 3 (three) times daily as needed. for muscle spams (Patient taking differently: Take 10 mg by mouth as needed. for muscle spams) 90 tablet 1  . levothyroxine (EUTHYROX) 150 MCG tablet Take 1 tablet (150 mcg total) by mouth daily before breakfast. 90 tablet 1  . lisinopril (ZESTRIL) 10 MG tablet Take 1 tablet (10 mg total) by mouth daily. (Patient taking differently: Take 20 mg by mouth daily. Takes 20 mg daily.) 30 tablet 2  . metoprolol tartrate (LOPRESSOR) 50 MG tablet Take 50 mg by mouth daily.     . ondansetron (ZOFRAN-ODT) 4 MG disintegrating tablet Take one tablet every 8 hours prn (Patient taking differently: as needed. Take one tablet every 8 hours prn) 30 tablet 2  . pantoprazole (PROTONIX) 40 MG tablet TAKE 1 TABLET BY MOUTH ONCE DAILY 30  MINUTES  PRIOR  TO  A  MEAL 30 tablet 11  . oxyCODONE-acetaminophen (PERCOCET) 10-325 MG tablet Take 1 tablet by mouth every 4 (four) hours as needed for pain. (Patient not taking: Reported on 01/01/2020) 30 tablet 0   Current Facility-Administered Medications  Medication Dose Route Frequency Provider Last Rate Last Admin  . methylPREDNISolone acetate (DEPO-MEDROL) injection 40 mg  40 mg Intra-articular Once [name], MD        Allergies as of 01/01/2020 - Review Complete 01/01/2020  Allergen Reaction Noted  . Adhesive [tape] Other (See Comments) 09/06/2014  . Celexa [citalopram hydrobromide] Other (See Comments) 08/09/2012    Family History  Problem Relation Age of Onset  . Hypertension Mother   . Thyroid disease Neg Hx     Social History   Socioeconomic History  . Marital status: Married    Spouse name: Not on file  . Number of children: Not on file  . Years of education: Not on file  . Highest education level: Not on file  Occupational History  . Occupation: Doesn't work outside the home  Tobacco Use  . Smoking status: Former Smoker    Packs/day: 1.00    Years: 26.00    Pack years: 26.00    Types:  Cigarettes    Quit date: 06/19/2019    Years since quitting: 0.5  . Smokeless tobacco: Former Clinical biochemist  . Vaping Use: Every day  Substance and Sexual Activity  . Alcohol use: Yes    Alcohol/week: 0.0 standard drinks    Comment: occasional  . Drug use: Not Currently    Types: Marijuana    Comment: occasional  . Sexual activity: Not Currently    Birth control/protection: Surgical  Other Topics Concern  . Not on file  Social History Narrative  . Not on file   Social Determinants of Health   Financial Resource Strain:   . Difficulty of Paying Living Expenses: Not on file  Food Insecurity:   . Worried About Programme researcher, broadcasting/film/video in the Last Year: Not on file  . Ran Out of Food in the Last Year: Not on file  Transportation Needs:   . Freight forwarder (Medical): Not on file  . Lack of Transportation (Non-Medical): Not on file  Physical Activity:   . Days of Exercise per Week: Not on file  . Minutes of Exercise per Session: Not on file  Stress:   . Feeling of Stress : Not on file  Social Connections:   . Frequency of Communication with Friends and Family: Not on file  . Frequency of Social Gatherings with Friends and Family: Not on file  . Attends Religious Services: Not on file  . Active Member of Clubs or Organizations: Not on file  . Attends Banker Meetings: Not on file  . Marital Status: Not on file    Subjective: Review of Systems  Constitutional: Negative for chills and fever.  HENT: Negative for congestion and hearing loss.   Eyes: Negative for blurred vision and double vision.  Respiratory: Negative for cough and shortness of breath.   Cardiovascular: Negative for chest pain and palpitations.  Gastrointestinal: Positive for blood in stool and heartburn. Negative for abdominal pain, constipation, diarrhea, melena and vomiting.  Genitourinary: Negative for dysuria and urgency.  Musculoskeletal: Negative for joint pain and myalgias.  Skin:  Negative for itching and rash.  Neurological: Negative for dizziness and headaches.  Psychiatric/Behavioral: Negative for depression. The patient is not nervous/anxious.      Objective: BP 119/76   Pulse 64   Temp (!) 97.5 F (36.4 C) (Temporal)   Ht 5\' 7"  (1.702 m)   Wt 240 lb (108.9 kg)   LMP 02/25/2016   BMI 37.59 kg/m  Physical Exam Constitutional:      Appearance: Normal appearance.  HENT:     Head: Normocephalic and atraumatic.  Eyes:     Extraocular Movements: Extraocular movements intact.     Conjunctiva/sclera: Conjunctivae normal.  Cardiovascular:     Rate and Rhythm: Normal rate and regular rhythm.  Pulmonary:     Effort: Pulmonary effort is normal.     Breath sounds: Normal breath sounds.  Abdominal:     General: Bowel sounds are normal.     Palpations: Abdomen is soft.  Musculoskeletal:        General: No swelling. Normal range of motion.     Cervical back: Normal range of motion and neck supple.  Skin:    General: Skin is warm and dry.     Coloration: Skin is not jaundiced.  Neurological:     General: No focal deficit present.     Mental Status: She is alert and oriented to person, place, and time.  Psychiatric:        Mood and Affect: Mood normal.        Behavior: Behavior normal.      Assessment: *Rectal bleeding-worsening *Chronic reflux-uncontrolled, breakthrough symptoms on Protonix  Plan: For patient's worsening chronic reflux, Will schedule for EGD to evaluate for peptic ulcer disease, esophagitis, gastritis, H. Pylori, duodenitis, or other. Will also evaluate for esophageal stricture, Schatzki's ring, esophageal web or other.   At the same time, we will perform colonoscopy to evaluate patient's rectal bleeding.  The risks including infection, bleed, or perforation as well as benefits, limitations, alternatives and imponderables have been reviewed with the patient. Potential for esophageal dilation, biopsy, etc. have also been reviewed.   Questions have been answered. All parties agreeable.  If no other identifiable cause of patient's bleeding found, we will consider hemorrhoid banding in the outpatient clinic.  I will increase patient's Protonix to 40  mg twice daily today in clinic, prescription sent to pharmacy.  01/01/2020 10:03 AM   Disclaimer: This note was dictated with voice recognition software. Similar sounding words can inadvertently be transcribed and may not be corrected upon review.

## 2020-01-01 NOTE — Telephone Encounter (Signed)
Called pt. She has been scheduled for TCS/EGD with Dr. Marletta Lor, asa 3 on 11/30 at 1:00pm. Aware will mail prep instructions with pre-op/covid test appt.

## 2020-01-01 NOTE — Patient Instructions (Signed)
We will schedule you for upper endoscopy to evaluate your worsening reflux.  I will increase your pantoprazole to 40 mg twice daily, prescription sent to your pharmacy.  We will also perform colonoscopy at the same time to evaluate your rectal bleeding.  If no other identifiable cause of bleeding is found besides internal hemorrhoids, we will consider hemorrhoid banding in the outpatient clinic.  Further recommendations to follow.  At [facility] we value your feedback. You may receive a survey about your visit today. Please share your experience as we strive to create trusting relationships with our patients to provide genuine, compassionate, quality care.  We appreciate your understanding and patience as we review any laboratory studies, imaging, and other diagnostic tests that are ordered as we care for you. Our office policy is 5 business days for review of these results, and any emergent or urgent results are addressed in a timely manner for your best interest. If you do not hear from our office in 1 week, please contact us.   We also encourage the use of MyChart, which contains your medical information for your review as well. If you are not enrolled in this feature, an access code is on this after visit summary for your convenience. Thank you for allowing [place] to be involved in your care.  It was great to see you today!  I hope you have a great rest of your fall!!    Joud Ingwersen K. Marletta Lor, D.O. Gastroenterology and Hepatology [facility]

## 2020-01-02 ENCOUNTER — Encounter: Payer: Self-pay | Admitting: *Deleted

## 2020-01-08 ENCOUNTER — Other Ambulatory Visit: Payer: Self-pay | Admitting: Neurological Surgery

## 2020-01-08 DIAGNOSIS — M461 Sacroiliitis, not elsewhere classified: Secondary | ICD-10-CM

## 2020-01-19 ENCOUNTER — Other Ambulatory Visit: Payer: Self-pay | Admitting: Neurological Surgery

## 2020-01-19 DIAGNOSIS — M461 Sacroiliitis, not elsewhere classified: Secondary | ICD-10-CM

## 2020-02-02 ENCOUNTER — Ambulatory Visit: Payer: 59 | Admitting: "Endocrinology

## 2020-02-03 ENCOUNTER — Other Ambulatory Visit: Payer: Self-pay

## 2020-02-03 ENCOUNTER — Ambulatory Visit
Admission: RE | Admit: 2020-02-03 | Discharge: 2020-02-03 | Disposition: A | Discharge status: Home / Self Care | Payer: 59 | Source: Ambulatory Visit | Attending: Neurological Surgery | Admitting: Neurological Surgery

## 2020-02-03 DIAGNOSIS — M461 Sacroiliitis, not elsewhere classified: Secondary | ICD-10-CM

## 2020-02-03 MED ORDER — DIPHENHYDRAMINE HCL 50 MG/ML IJ SOLN
50.0000 mg | Freq: Once | INTRAMUSCULAR | Status: AC
  Administered 2020-02-03: 50 mg via INTRAMUSCULAR

## 2020-02-03 NOTE — Progress Notes (Signed)
Pt had CT guided SI joint injection without complications earlier today. Pt presented back to [facility] after leaving reporting "the left side of my face is swollen". Pt brought back to nurses station. Dr. Mosetta Putt assessed pt. Pt denies SOB, wheezing, or any other complaints at this time. Pt talking in complete sentences. Left side of face is swollen, tongue does not appear to be swollen. IM benadryl given per Dr. Mosetta Putt. Will keep pt in nurses station until Dr. Mosetta Putt discharges pt.

## 2020-02-04 ENCOUNTER — Other Ambulatory Visit: Payer: Self-pay

## 2020-02-04 ENCOUNTER — Encounter (HOSPITAL_COMMUNITY): Payer: Self-pay | Admitting: Emergency Medicine

## 2020-02-04 DIAGNOSIS — R42 Dizziness and giddiness: Secondary | ICD-10-CM | POA: Diagnosis not present

## 2020-02-04 DIAGNOSIS — R519 Headache, unspecified: Secondary | ICD-10-CM | POA: Diagnosis not present

## 2020-02-04 DIAGNOSIS — T413X5A Adverse effect of local anesthetics, initial encounter: Secondary | ICD-10-CM | POA: Diagnosis not present

## 2020-02-04 DIAGNOSIS — R220 Localized swelling, mass and lump, head: Secondary | ICD-10-CM | POA: Insufficient documentation

## 2020-02-04 DIAGNOSIS — Z5321 Procedure and treatment not carried out due to patient leaving prior to being seen by health care provider: Secondary | ICD-10-CM | POA: Insufficient documentation

## 2020-02-04 NOTE — ED Triage Notes (Signed)
Pt c/o a headache and dizziness as a result from Bupivacaine injection yesterday. Pt was given benadryl yesterday and facial swelling has resolved.

## 2020-02-05 ENCOUNTER — Emergency Department (HOSPITAL_COMMUNITY)
Admission: EM | Admit: 2020-02-05 | Discharge: 2020-02-05 | Disposition: A | Discharge status: Home / Self Care | Payer: 59 | Attending: Emergency Medicine | Admitting: Emergency Medicine

## 2020-02-09 NOTE — Patient Instructions (Signed)
Arley Garant Pontius  02/09/2020     @PREFPERIOPPHARMACY @   Your procedure is scheduled on  02/17/2020.  Report to 02/19/2020 at  1045  A.M.  Call this number if you have problems the morning of surgery:  [phone]   Remember:  Follow the diet and prep instrucrions given to you by the office.                      Take these medicines the morning of surgery with A SIP OF WATER  Flexeril, levothyroxine, metoprolol, zofran(if needed), protonix.    Do not wear jewelry, make-up or nail polish.  Do not wear lotions, powders, or perfumes. Please wear deodorant and brush your teeth.  Do not shave 48 hours prior to surgery.  Men may shave face and neck.  Do not bring valuables to the hospital.  [facility] is not responsible for any belongings or valuables.  Contacts, dentures or bridgework may not be worn into surgery.  Leave your suitcase in the car.  After surgery it may be brought to your room.  For patients admitted to the hospital, discharge time will be determined by your treatment team.  Patients discharged the day of surgery will not be allowed to drive home.   Name and phone number of your driver:   family Special instructions:  DO NOT smoke the morning of your procedure.  Please read over the following fact sheets that you were given. Anesthesia Post-op Instructions and Care and Recovery After Surgery       Upper Endoscopy, Adult, Care After This sheet gives you information about how to care for yourself after your procedure. Your health care provider may also give you more specific instructions. If you have problems or questions, contact your health care provider. What can I expect after the procedure? After the procedure, it is common to have:  A sore throat.  Mild stomach pain or discomfort.  Bloating.  Nausea. Follow these instructions at home:   Follow instructions from your health care provider about what to eat or drink after your  procedure.  Return to your normal activities as told by your health care provider. Ask your health care provider what activities are safe for you.  Take over-the-counter and prescription medicines only as told by your health care provider.  Do not drive for 24 hours if you were given a sedative during your procedure.  Keep all follow-up visits as told by your health care provider. This is important. Contact a health care provider if you have:  A sore throat that lasts longer than one day.  Trouble swallowing. Get help right away if:  You vomit blood or your vomit looks like coffee grounds.  You have: ? A fever. ? Bloody, black, or tarry stools. ? A severe sore throat or you cannot swallow. ? Difficulty breathing. ? Severe pain in your chest or abdomen. Summary  After the procedure, it is common to have a sore throat, mild stomach discomfort, bloating, and nausea.  Do not drive for 24 hours if you were given a sedative during the procedure.  Follow instructions from your health care provider about what to eat or drink after your procedure.  Return to your normal activities as told by your health care provider. This information is not intended to replace advice given to you by your health care provider. Make sure you discuss any questions you have with your health  care provider. Document Revised: 08/28/2017 Document Reviewed: 08/06/2017 Elsevier Patient Education  [place].  Colonoscopy, Adult, Care After This sheet gives you information about how to care for yourself after your procedure. Your health care provider may also give you more specific instructions. If you have problems or questions, contact your health care provider. What can I expect after the procedure? After the procedure, it is common to have:  A small amount of blood in your stool for 24 hours after the procedure.  Some gas.  Mild cramping or bloating of your abdomen. Follow these instructions  at home: Eating and drinking   Drink enough fluid to keep your urine pale yellow.  Follow instructions from your health care provider about eating or drinking restrictions.  Resume your normal diet as instructed by your health care provider. Avoid heavy or fried foods that are hard to digest. Activity  Rest as told by your health care provider.  Avoid sitting for a long time without moving. Get up to take short walks every 1-2 hours. This is important to improve blood flow and breathing. Ask for help if you feel weak or unsteady.  Return to your normal activities as told by your health care provider. Ask your health care provider what activities are safe for you. Managing cramping and bloating   Try walking around when you have cramps or feel bloated.  Apply heat to your abdomen as told by your health care provider. Use the heat source that your health care provider recommends, such as a moist heat pack or a heating pad. ? Place a towel between your skin and the heat source. ? Leave the heat on for 20-30 minutes. ? Remove the heat if your skin turns bright red. This is especially important if you are unable to feel pain, heat, or cold. You may have a greater risk of getting burned. General instructions  For the first 24 hours after the procedure: ? Do not drive or use machinery. ? Do not sign important documents. ? Do not drink alcohol. ? Do your regular daily activities at a slower pace than normal. ? Eat soft foods that are easy to digest.  Take over-the-counter and prescription medicines only as told by your health care provider.  Keep all follow-up visits as told by your health care provider. This is important. Contact a health care provider if:  You have blood in your stool 2-3 days after the procedure. Get help right away if you have:  More than a small spotting of blood in your stool.  Large blood clots in your stool.  Swelling of your abdomen.  Nausea or  vomiting.  A fever.  Increasing pain in your abdomen that is not relieved with medicine. Summary  After the procedure, it is common to have a small amount of blood in your stool. You may also have mild cramping and bloating of your abdomen.  For the first 24 hours after the procedure, do not drive or use machinery, sign important documents, or drink alcohol.  Get help right away if you have a lot of blood in your stool, nausea or vomiting, a fever, or increased pain in your abdomen. This information is not intended to replace advice given to you by your health care provider. Make sure you discuss any questions you have with your health care provider. Document Revised: 09/30/2018 Document Reviewed: 09/30/2018 Elsevier Patient Education  [place] After These instructions provide you with information about  caring for yourself after your procedure. Your health care provider may also give you more specific instructions. Your treatment has been planned according to current medical practices, but problems sometimes occur. Call your health care provider if you have any problems or questions after your procedure. What can I expect after the procedure? After your procedure, you may:  Feel sleepy for several hours.  Feel clumsy and have poor balance for several hours.  Feel forgetful about what happened after the procedure.  Have poor judgment for several hours.  Feel nauseous or vomit.  Have a sore throat if you had a breathing tube during the procedure. Follow these instructions at home: For at least 24 hours after the procedure:      Have a responsible adult stay with you. It is important to have someone help care for you until you are awake and alert.  Rest as needed.  Do not: ? Participate in activities in which you could fall or become injured. ? Drive. ? Use heavy machinery. ? Drink alcohol. ? Take sleeping pills or medicines that  cause drowsiness. ? Make important decisions or sign legal documents. ? Take care of children on your own. Eating and drinking  Follow the diet that is recommended by your health care provider.  If you vomit, drink water, juice, or soup when you can drink without vomiting.  Make sure you have little or no nausea before eating solid foods. General instructions  Take over-the-counter and prescription medicines only as told by your health care provider.  If you have sleep apnea, surgery and certain medicines can increase your risk for breathing problems. Follow instructions from your health care provider about wearing your sleep device: ? Anytime you are sleeping, including during daytime naps. ? While taking prescription pain medicines, sleeping medicines, or medicines that make you drowsy.  If you smoke, do not smoke without supervision.  Keep all follow-up visits as told by your health care provider. This is important. Contact a health care provider if:  You keep feeling nauseous or you keep vomiting.  You feel light-headed.  You develop a rash.  You have a fever. Get help right away if:  You have trouble breathing. Summary  For several hours after your procedure, you may feel sleepy and have poor judgment.  Have a responsible adult stay with you for at least 24 hours or until you are awake and alert. This information is not intended to replace advice given to you by your health care provider. Make sure you discuss any questions you have with your health care provider. Document Revised: 06/04/2017 Document Reviewed: 06/27/2015 Elsevier Patient Education  [place].

## 2020-02-10 ENCOUNTER — Encounter (HOSPITAL_COMMUNITY): Payer: Self-pay

## 2020-02-10 ENCOUNTER — Other Ambulatory Visit: Payer: Self-pay

## 2020-02-10 ENCOUNTER — Encounter (HOSPITAL_COMMUNITY)
Admission: RE | Admit: 2020-02-10 | Discharge: 2020-02-10 | Disposition: A | Discharge status: Home / Self Care | Payer: 59 | Source: Ambulatory Visit | Attending: Internal Medicine | Admitting: Internal Medicine

## 2020-02-13 ENCOUNTER — Other Ambulatory Visit: Payer: Self-pay | Admitting: "Endocrinology

## 2020-02-16 ENCOUNTER — Other Ambulatory Visit: Payer: Self-pay

## 2020-02-16 ENCOUNTER — Other Ambulatory Visit (HOSPITAL_COMMUNITY)
Admission: RE | Admit: 2020-02-16 | Discharge: 2020-02-16 | Disposition: A | Discharge status: Home / Self Care | Payer: 59 | Source: Ambulatory Visit | Attending: Internal Medicine | Admitting: Internal Medicine

## 2020-02-16 DIAGNOSIS — Z01818 Encounter for other preprocedural examination: Secondary | ICD-10-CM | POA: Diagnosis present

## 2020-02-16 DIAGNOSIS — Z20822 Contact with and (suspected) exposure to covid-19: Secondary | ICD-10-CM | POA: Insufficient documentation

## 2020-02-16 LAB — SARS CORONAVIRUS 2 (TAT 6-24 HRS): SARS Coronavirus 2: NEGATIVE

## 2020-02-17 ENCOUNTER — Ambulatory Visit (HOSPITAL_COMMUNITY): Payer: 59

## 2020-02-17 ENCOUNTER — Ambulatory Visit (HOSPITAL_COMMUNITY)
Admission: RE | Admit: 2020-02-17 | Discharge: 2020-02-17 | Disposition: A | Discharge status: Home / Self Care | Payer: 59 | Attending: Internal Medicine | Admitting: Internal Medicine

## 2020-02-17 ENCOUNTER — Other Ambulatory Visit: Payer: Self-pay

## 2020-02-17 ENCOUNTER — Encounter (HOSPITAL_COMMUNITY)
Admission: RE | Disposition: A | Discharge status: Home / Self Care | Payer: Self-pay | Source: Home / Self Care | Attending: Internal Medicine

## 2020-02-17 ENCOUNTER — Encounter (HOSPITAL_COMMUNITY): Payer: Self-pay

## 2020-02-17 DIAGNOSIS — Z79899 Other long term (current) drug therapy: Secondary | ICD-10-CM | POA: Insufficient documentation

## 2020-02-17 DIAGNOSIS — K30 Functional dyspepsia: Secondary | ICD-10-CM | POA: Diagnosis not present

## 2020-02-17 DIAGNOSIS — R12 Heartburn: Secondary | ICD-10-CM | POA: Diagnosis not present

## 2020-02-17 DIAGNOSIS — K625 Hemorrhage of anus and rectum: Secondary | ICD-10-CM | POA: Insufficient documentation

## 2020-02-17 DIAGNOSIS — Z87891 Personal history of nicotine dependence: Secondary | ICD-10-CM | POA: Diagnosis not present

## 2020-02-17 DIAGNOSIS — K648 Other hemorrhoids: Secondary | ICD-10-CM | POA: Insufficient documentation

## 2020-02-17 DIAGNOSIS — K2970 Gastritis, unspecified, without bleeding: Secondary | ICD-10-CM | POA: Diagnosis not present

## 2020-02-17 DIAGNOSIS — Z79890 Hormone replacement therapy (postmenopausal): Secondary | ICD-10-CM | POA: Insufficient documentation

## 2020-02-17 DIAGNOSIS — K3189 Other diseases of stomach and duodenum: Secondary | ICD-10-CM | POA: Insufficient documentation

## 2020-02-17 HISTORY — PX: BIOPSY: SHX5522

## 2020-02-17 HISTORY — PX: ESOPHAGOGASTRODUODENOSCOPY (EGD) WITH PROPOFOL: SHX5813

## 2020-02-17 HISTORY — PX: COLONOSCOPY WITH PROPOFOL: SHX5780

## 2020-02-17 SURGERY — COLONOSCOPY WITH PROPOFOL
Anesthesia: General

## 2020-02-17 MED ORDER — LACTATED RINGERS IV SOLN: INTRAVENOUS | Status: DC

## 2020-02-17 MED ORDER — PROPOFOL 10 MG/ML IV BOLUS
INTRAVENOUS | Status: DC | PRN
  Administered 2020-02-17: 100 mg via INTRAVENOUS

## 2020-02-17 MED ORDER — CHLORHEXIDINE GLUCONATE CLOTH 2 % EX PADS: 6.0000 | MEDICATED_PAD | Freq: Once | CUTANEOUS | Status: DC

## 2020-02-17 MED ORDER — PROPOFOL 500 MG/50ML IV EMUL
INTRAVENOUS | Status: DC | PRN
  Administered 2020-02-17: 150 ug/kg/min via INTRAVENOUS

## 2020-02-17 MED ORDER — MIDAZOLAM HCL 2 MG/2ML IJ SOLN
INTRAMUSCULAR | Status: AC
  Filled 2020-02-17: qty 2

## 2020-02-17 MED ORDER — STERILE WATER FOR IRRIGATION IR SOLN
Status: DC | PRN
  Administered 2020-02-17: 1.5 mL

## 2020-02-17 MED ORDER — OMEPRAZOLE 40 MG PO CPDR: 40.0000 mg | DELAYED_RELEASE_CAPSULE | Freq: Two times a day (BID) | ORAL | 5 refills | Status: DC

## 2020-02-17 MED ORDER — LIDOCAINE HCL (CARDIAC) PF 100 MG/5ML IV SOSY: PREFILLED_SYRINGE | INTRAVENOUS | Status: DC | PRN

## 2020-02-17 MED ORDER — MIDAZOLAM HCL 2 MG/2ML IJ SOLN
INTRAMUSCULAR | Status: DC | PRN
  Administered 2020-02-17: 2 mg via INTRAVENOUS

## 2020-02-17 NOTE — Op Note (Addendum)
[facility] Patient Name: Teresa Vincent Procedure Date: 02/17/2020 11:55 AM MRN: [id number] Date of Birth: 1980/11/16 Attending MD: Elon Alas. Abbey Chatters DO CSN: [id number] Age: 39 Admit Type: Outpatient Procedure:                Colonoscopy Indications:              Rectal bleeding Providers:                Elon Alas. Abbey Chatters, DO, Caprice Kluver, Casimer Bilis, Technician Referring MD:              Medicines:                See the Anesthesia note for documentation of the                            administered medications Complications:            No immediate complications. Estimated Blood Loss:     Estimated blood loss: none. Estimated blood loss:                            none. Procedure:                Pre-Anesthesia Assessment:                           - The anesthesia plan was to use monitored                            anesthesia care (MAC).                           After obtaining informed consent, the colonoscope                            was passed under direct vision. Throughout the                            procedure, the patient's blood pressure, pulse, and                            oxygen saturations were monitored continuously. The                            [id number]-[id number] ([id number]) scope was introduced through                            the anus and advanced to the the cecum, identified                            by appendiceal orifice and ileocecal valve. The                            colonoscopy was performed without difficulty. The  patient tolerated the procedure well. The quality                            of the bowel preparation was evaluated using the                            BBPS [facility] Bowel Preparation Scale) with scores                            of: Right Colon = 3, Transverse Colon = 3 and Left                            Colon = 3 (entire mucosa seen well with no residual                             staining, small fragments of stool or opaque                            liquid). The total BBPS score equals 9. Scope In: 11:57:22 AM Scope Out: 12:07:44 PM Scope Withdrawal Time: 0 hours 6 minutes 3 seconds  Total Procedure Duration: 0 hours 10 minutes 22 seconds  Findings:      The perianal and digital rectal examinations were normal.      Non-bleeding internal hemorrhoids were found during retroflexion.      The exam was otherwise without abnormality. Impression:               - Non-bleeding internal hemorrhoids.                           - The examination was otherwise normal.                           - No specimens collected. Moderate Sedation:      Per Anesthesia Care Recommendation:           - Patient has a contact number available for                            emergencies. The signs and symptoms of potential                            delayed complications were discussed with the                            patient. Return to normal activities tomorrow.                            Written discharge instructions were provided to the                            patient.                           - Resume previous diet.                           -  Continue present medications.                           - Repeat colonoscopy in 10 years for screening                            purposes.                           - Return to GI clinic in 2 months with Roseanne Kaufman                            for hemorrhoid banding. Procedure Code(s):        --- Professional ---                           [phone], Colonoscopy, flexible; diagnostic, including                            collection of specimen(s) by brushing or washing,                            when performed (separate procedure) Diagnosis Code(s):        --- Professional ---                           K64.8, Other hemorrhoids                           K62.5, Hemorrhage of anus and rectum CPT copyright 2019 American Medical  Association. All rights reserved. The codes documented in this report are preliminary and upon coder review may  be revised to meet current compliance requirements. Elon Alas. Abbey Chatters, DO [place] Abbey Chatters, DO 02/17/2020 12:10:14 PM This report has been signed electronically. Number of Addenda: 0

## 2020-02-17 NOTE — Anesthesia Preprocedure Evaluation (Signed)
Anesthesia Evaluation  Patient identified by MRN, date of birth, ID band Patient awake    Reviewed: Allergy & Precautions, H&P , NPO status , Patient's Chart, lab work & pertinent test results, reviewed documented beta blocker date and time   History of Anesthesia Complications (+) AWARENESS UNDER ANESTHESIA and history of anesthetic complications  Airway Mallampati: II  TM Distance: >3 FB Neck ROM: full    Dental no notable dental hx.    Pulmonary asthma , sleep apnea and Continuous Positive Airway Pressure Ventilation , former smoker,    Pulmonary exam normal breath sounds clear to auscultation       Cardiovascular Exercise Tolerance: Good hypertension, negative cardio ROS   Rhythm:regular Rate:Normal     Neuro/Psych  Headaches, PSYCHIATRIC DISORDERS Anxiety  Neuromuscular disease CVA, No Residual Symptoms    GI/Hepatic Neg liver ROS, GERD  Medicated,  Endo/Other  Hypothyroidism   Renal/GU Renal disease  negative genitourinary   Musculoskeletal   Abdominal   Peds  Hematology negative hematology ROS (+)   Anesthesia Other Findings   Reproductive/Obstetrics negative OB ROS                             Anesthesia Physical Anesthesia Plan  ASA: III  Anesthesia Plan: General   Post-op Pain Management:    Induction:   PONV Risk Score and Plan: Propofol infusion  Airway Management Planned:   Additional Equipment:   Intra-op Plan:   Post-operative Plan:   Informed Consent: I have reviewed the patients History and Physical, chart, labs and discussed the procedure including the risks, benefits and alternatives for the proposed anesthesia with the patient or authorized representative who has indicated his/her understanding and acceptance.     Dental Advisory Given  Plan Discussed with: CRNA  Anesthesia Plan Comments:         Anesthesia Quick Evaluation

## 2020-02-17 NOTE — Transfer of Care (Signed)
Immediate Anesthesia Transfer of Care Note  Patient: Teresa Vincent  Procedure(s) Performed: COLONOSCOPY WITH PROPOFOL (N/A ) ESOPHAGOGASTRODUODENOSCOPY (EGD) WITH PROPOFOL (N/A ) BIOPSY  Patient Location: PACU  Anesthesia Type:MAC  Level of Consciousness: awake, alert  and oriented  Airway & Oxygen Therapy: Patient Spontanous Breathing  Post-op Assessment: Report given to RN and Post -op Vital signs reviewed and stable  Post vital signs: Reviewed and stable  Last Vitals:  Vitals Value Taken Time  BP    Temp    Pulse    Resp    SpO2      Last Pain:  Vitals:   02/17/20 1148  TempSrc:   PainSc: 0-No pain      Patients Stated Pain Goal: 5 (62/13/08 6578)  Complications: No complications documented.

## 2020-02-17 NOTE — Discharge Instructions (Signed)
EGD Discharge instructions Please read the instructions outlined below and refer to this sheet in the next few weeks. These discharge instructions provide you with general information on caring for yourself after you leave the hospital. Your doctor may also give you specific instructions. While your treatment has been planned according to the most current medical practices available, unavoidable complications occasionally occur. If you have any problems or questions after discharge, please call your doctor. ACTIVITY  You may resume your regular activity but move at a slower pace for the next 24 hours.   Take frequent rest periods for the next 24 hours.   Walking will help expel (get rid of) the air and reduce the bloated feeling in your abdomen.   No driving for 24 hours (because of the anesthesia (medicine) used during the test).   You may shower.   Do not sign any important legal documents or operate any machinery for 24 hours (because of the anesthesia used during the test).  NUTRITION  Drink plenty of fluids.   You may resume your normal diet.   Begin with a light meal and progress to your normal diet.   Avoid alcoholic beverages for 24 hours or as instructed by your caregiver.  MEDICATIONS  You may resume your normal medications unless your caregiver tells you otherwise.  WHAT YOU CAN EXPECT TODAY  You may experience abdominal discomfort such as a feeling of fullness or "gas" pains.  FOLLOW-UP  Your doctor will discuss the results of your test with you.  SEEK IMMEDIATE MEDICAL ATTENTION IF ANY OF THE FOLLOWING OCCUR:  Excessive nausea (feeling sick to your stomach) and/or vomiting.   Severe abdominal pain and distention (swelling).   Trouble swallowing.   Temperature over 101 F (37.8 C).   Rectal bleeding or vomiting of blood.     Colonoscopy Discharge Instructions  Read the instructions outlined below and refer to this sheet in the next few weeks. These  discharge instructions provide you with general information on caring for yourself after you leave the hospital. Your doctor may also give you specific instructions. While your treatment has been planned according to the most current medical practices available, unavoidable complications occasionally occur.   ACTIVITY  You may resume your regular activity, but move at a slower pace for the next 24 hours.   Take frequent rest periods for the next 24 hours.   Walking will help get rid of the air and reduce the bloated feeling in your belly (abdomen).   No driving for 24 hours (because of the medicine (anesthesia) used during the test).    Do not sign any important legal documents or operate any machinery for 24 hours (because of the anesthesia used during the test).  NUTRITION  Drink plenty of fluids.   You may resume your normal diet as instructed by your doctor.   Begin with a light meal and progress to your normal diet. Heavy or fried foods are harder to digest and may make you feel sick to your stomach (nauseated).   Avoid alcoholic beverages for 24 hours or as instructed.  MEDICATIONS  You may resume your normal medications unless your doctor tells you otherwise.  WHAT YOU CAN EXPECT TODAY  Some feelings of bloating in the abdomen.   Passage of more gas than usual.   Spotting of blood in your stool or on the toilet paper.  IF YOU HAD POLYPS REMOVED DURING THE COLONOSCOPY:  No aspirin products for 7 days or as instructed.  No alcohol for 7 days or as instructed.   Eat a soft diet for the next 24 hours.  FINDING OUT THE RESULTS OF YOUR TEST Not all test results are available during your visit. If your test results are not back during the visit, make an appointment with your caregiver to find out the results. Do not assume everything is normal if you have not heard from your caregiver or the medical facility. It is important for you to follow up on all of your test results.    SEEK IMMEDIATE MEDICAL ATTENTION IF:  You have more than a spotting of blood in your stool.   Your belly is swollen (abdominal distention).   You are nauseated or vomiting.   You have a temperature over 101.   You have abdominal pain or discomfort that is severe or gets worse throughout the day.   Your EGD showed a mild amount inflammation in your stomach.  I biopsied this to rule out infection with a bacteria called H. pylori.  I am going to change your pantoprazole to omeprazole 40 mg twice daily.  I sent this to your pharmacy.  Await pathology results, my office will contact you.  Your colonoscopy was relatively unremarkable.  You do have large internal hemorrhoids which is likely causing your bleeding.  I did not find any colon cancer or polyps.  I did not find any evidence of underlying inflammatory bowel disease such as Crohn's disease or ulcerative colitis.  If you are amenable, we will schedule you for hemorrhoid banding with Lewie Loron in 6 to 8 weeks.  Repeat colonoscopy in 10 years for screening or sooner if high risk.  I hope you have a great rest of your week!  Hennie Duos. Marletta Lor, D.O. Gastroenterology and Hepatology [facility]

## 2020-02-17 NOTE — Op Note (Signed)
[facility] Patient Name: Teresa Vincent Procedure Date: 02/17/2020 11:37 AM MRN: [id number] Date of Birth: 02/23/1981 Attending MD: Elon Alas. Abbey Chatters DO CSN: [id number] Age: 39 Admit Type: Outpatient Procedure:                Upper GI endoscopy Indications:              Functional Dyspepsia, Heartburn Providers:                Elon Alas. Abbey Chatters, DO, Caprice Kluver, Casimer Bilis, Technician Referring MD:              Medicines:                See the Anesthesia note for documentation of the                            administered medications Complications:            No immediate complications. Estimated Blood Loss:     Estimated blood loss was minimal. Procedure:                Pre-Anesthesia Assessment:                           - The anesthesia plan was to use monitored                            anesthesia care (MAC).                           After obtaining informed consent, the endoscope was                            passed under direct vision. Throughout the                            procedure, the patient's blood pressure, pulse, and                            oxygen saturations were monitored continuously. The                            [id number]-H190 ([id number]) scope was introduced through the                            mouth, and advanced to the second part of duodenum.                            The upper GI endoscopy was accomplished without                            difficulty. The patient tolerated the procedure                            well. Scope In: 11:49:55 AM  Scope Out: 11:52:30 AM Total Procedure Duration: 0 hours 2 minutes 35 seconds  Findings:      The Z-line was regular and was found 39 cm from the incisors.      Diffuse mild inflammation characterized by erosions and erythema was       found in the entire examined stomach. Biopsies were taken with a cold       forceps for Helicobacter pylori testing.      The  duodenal bulb, first portion of the duodenum and second portion of       the duodenum were normal. Impression:               - Z-line regular, 39 cm from the incisors.                           - Gastritis. Biopsied.                           - Normal duodenal bulb, first portion of the                            duodenum and second portion of the duodenum. Moderate Sedation:      Per Anesthesia Care Recommendation:           - Patient has a contact number available for                            emergencies. The signs and symptoms of potential                            delayed complications were discussed with the                            patient. Return to normal activities tomorrow.                            Written discharge instructions were provided to the                            patient.                           - Resume previous diet.                           - Continue present medications.                           - Await pathology results.                           - Use Prilosec (omeprazole) 40 mg PO BID for 8                            weeks then once daily therafter.                           - No  ibuprofen, naproxen, or other non-steroidal                            anti-inflammatory drugs. Procedure Code(s):        --- Professional ---                           [phone], Esophagogastroduodenoscopy, flexible,                            transoral; with biopsy, single or multiple Diagnosis Code(s):        --- Professional ---                           K29.70, Gastritis, unspecified, without bleeding                           K30, Functional dyspepsia                           R12, Heartburn CPT copyright 2019 American Medical Association. All rights reserved. The codes documented in this report are preliminary and upon coder review may  be revised to meet current compliance requirements. Elon Alas. Abbey Chatters, DO [place] Abbey Chatters, DO 02/17/2020 11:54:52 AM This  report has been signed electronically. Number of Addenda: 0

## 2020-02-17 NOTE — Anesthesia Postprocedure Evaluation (Signed)
Anesthesia Post Note  Patient: Teresa Vincent  Procedure(s) Performed: COLONOSCOPY WITH PROPOFOL (N/A ) ESOPHAGOGASTRODUODENOSCOPY (EGD) WITH PROPOFOL (N/A ) BIOPSY  Patient location during evaluation: PACU Anesthesia Type: General Level of consciousness: awake, oriented and awake and alert Pain management: pain level controlled Vital Signs Assessment: post-procedure vital signs reviewed and stable Respiratory status: spontaneous breathing, respiratory function stable and nonlabored ventilation Cardiovascular status: blood pressure returned to baseline and stable Postop Assessment: no apparent nausea or vomiting Anesthetic complications: no   No complications documented.   Last Vitals:  Vitals:   02/17/20 1052 02/17/20 1059  BP: 112/73 119/80  Pulse: 83 72  Resp: 17 14  Temp: 37.2 C 37.2 C  SpO2: 97% 97%    Last Pain:  Vitals:   02/17/20 1148  TempSrc:   PainSc: 0-No pain                 Lorin Glass

## 2020-02-17 NOTE — H&P (Signed)
Primary Care Physician:  Annalee Genta, DO Primary Gastroenterologist:  Dr. Marletta Lor  Pre-Procedure History & Physical: HPI:  Teresa Vincent is a 39 y.o. female is here for an EGD due to reflux despite PPI therapy and colonoscopy to be performed for rectal bleeding. Patient denies any family history of colorectal cancer.  No melena.  No abdominal pain or unintentional weight loss.  No change in bowel habits.    Past Medical History:  Diagnosis Date  . Abnormal Pap smear of cervix   . ANXIETY 08/30/2007  . Asthma   . Chronic back pain   . Claustrophobia    pt requests Versed before surgery.  . Complication of anesthesia    hx of waking up during anesthesia  . GERD (gastroesophageal reflux disease)   . History of kidney stones   . Hypertension   . 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 INSOMNIA 08/30/2007  . MIGRAINE HEADACHE 08/30/2007  . Neck pain   . Palpitations    occasionally couple times a week  . Sciatica   . Sleep apnea    cpap  . Stroke [facility]) 2010   states, no deficits and on no meds now.    Past Surgical History:  Procedure Laterality Date  . ABDOMINAL EXPOSURE N/A 05/29/2017   Procedure: ABDOMINAL EXPOSURE;  Surgeon: Chuck Hint, MD;  Location: [facility] OR;  Service: Vascular;  Laterality: N/A;  . ABDOMINAL HYSTERECTOMY    . ANTERIOR LUMBAR FUSION N/A 05/29/2017   Procedure: Anterior Lumbar Interbody Fusion  - Lumbar five sacral one;  Surgeon: Tia Alert, MD;  Location: [facility] OR;  Service: Neurosurgery;  Laterality: N/A;  . CARPAL TUNNEL RELEASE Bilateral   . CHOLECYSTECTOMY    . CHOLECYSTECTOMY N/A 12/29/2016   Procedure: LAPAROSCOPIC CHOLECYSTECTOMY;  Surgeon: [name], MD;  Location: [facility] ORS;  Service: General;  Laterality: N/A;  . CYSTOSCOPY/URETEROSCOPY/HOLMIUM LASER/STENT PLACEMENT Right 06/25/2019   Procedure: CYSTOSCOPY WITH RIGHT RETROGRADE PYELOGRAM/RIGHT URETEROSCOPY/HOLMIUM LASER APPLICATION RIGHT URETERAL CALCULUS/RIGHT URETERAL  STENT PLACEMENT;  Surgeon: Malen Gauze, MD;  Location: [facility] ORS;  Service: Urology;  Laterality: Right;  . EXTRACORPOREAL SHOCK WAVE LITHOTRIPSY Right 04/10/2019   Procedure: EXTRACORPOREAL SHOCK WAVE LITHOTRIPSY (ESWL);  Surgeon: Noel Christmas, MD;  Location: [facility];  Service: Urology;  Laterality: Right;  . LAMINECTOMY WITH POSTERIOR LATERAL ARTHRODESIS LEVEL 1 N/A 02/28/2018   Procedure: Laminectomy and Foraminotomy Lumbar Five-Sacral One, posterolateral fusion and fixation;  Surgeon: Tia Alert, MD;  Location: [facility] OR;  Service: Neurosurgery;  Laterality: N/A;  posterior  . LUMBAR EPIDURAL INJECTION    . STERIOD INJECTION N/A 01/10/2017   Procedure: MINOR EXPAREL INJECTION;  Surgeon: [name], MD;  Location: [facility] ORS;  Service: General;  Laterality: N/A;  . TOTAL ABDOMINAL HYSTERECTOMY W/ BILATERAL SALPINGOOPHORECTOMY N/A   . TUBAL LIGATION  2003    Prior to Admission medications   Medication Sig Start Date End Date Taking? Authorizing Provider  ALPRAZolam (XANAX) 0.5 MG tablet TAKE 1 TABLET BY MOUTH ONCE DAILY AT BEDTIME AS NEEDED FOR SLEEP Patient taking differently: Take 0.25 mg by mouth at bedtime as needed for sleep.  07/09/19  Yes [name], MD  alum & mag hydroxide-simeth (MAALOX/MYLANTA) 200-200-20 MG/5ML suspension Take 30 mLs by mouth every 6 (six) hours as needed for indigestion or heartburn.   Yes [provider]  Aspirin-Salicylamide-Caffeine (BC HEADACHE PO) Take 1 packet by mouth daily as needed (headaches).   Yes [provider]  cholestyramine Lanetta Inch)  4 GM/DOSE powder One scoop BID prn diarrhea Patient taking differently: Take 4 g by mouth 2 (two) times daily as needed (diarrhea).  12/03/18  Yes [name], MD  cyclobenzaprine (FLEXERIL) 10 MG tablet Take 1 tablet (10 mg total) by mouth 3 (three) times daily as needed. for muscle spams Patient taking differently: Take 10 mg by mouth daily as needed for  muscle spasms.  02/28/18  Yes Tia Alert, MD  Evening Primrose Oil 1000 MG CAPS Take 1,000 mg by mouth 2 (two) times a week.   Yes [provider]  ibuprofen (ADVIL) 200 MG tablet Take 600-800 mg by mouth every 6 (six) hours as needed for headache or moderate pain.   Yes [provider]  levothyroxine (SYNTHROID) 150 MCG tablet TAKE 1 TABLET BY MOUTH ONCE DAILY BEFORE BREAKFAST 02/16/20  Yes Nida, Denman George, MD  lisinopril (ZESTRIL) 20 MG tablet Take 20 mg by mouth daily.   Yes [provider]  metoprolol succinate (TOPROL-XL) 50 MG 24 hr tablet Take 50 mg by mouth daily. Take with or immediately following a meal.   Yes [provider]  ondansetron (ZOFRAN-ODT) 4 MG disintegrating tablet Take one tablet every 8 hours prn Patient taking differently: Take 4 mg by mouth every 8 (eight) hours as needed for nausea or vomiting.  08/11/19  Yes [name], MD  pantoprazole (PROTONIX) 40 MG tablet Take 1 tablet (40 mg total) by mouth 2 (two) times daily. 01/01/20 12/31/20 Yes Lanelle Bal, DO  atorvastatin (LIPITOR) 20 MG tablet Take 1 tablet by mouth once daily 02/16/20   Roma Kayser, MD    Allergies as of 01/01/2020 - Review Complete 01/01/2020  Allergen Reaction Noted  . Adhesive [tape] Other (See Comments) 09/06/2014  . Celexa [citalopram hydrobromide] Other (See Comments) 08/09/2012    Family History  Problem Relation Age of Onset  . Hypertension Mother   . Thyroid disease Neg Hx     Social History   Socioeconomic History  . Marital status: Married    Spouse name: Not on file  . Number of children: Not on file  . Years of education: Not on file  . Highest education level: Not on file  Occupational History  . Occupation: Doesn't work outside the home  Tobacco Use  . Smoking status: Former Smoker    Packs/day: 1.00    Years: 26.00    Pack years: 26.00    Types: Cigarettes    Quit date: 06/19/2019    Years since  quitting: 0.6  . Smokeless tobacco: Former Clinical biochemist  . Vaping Use: Every day  . Substances: Nicotine, Flavoring  Substance and Sexual Activity  . Alcohol use: Yes    Alcohol/week: 0.0 standard drinks    Comment: occasional  . Drug use: Not Currently    Types: Marijuana    Comment: occasional  . Sexual activity: Not Currently    Birth control/protection: Surgical  Other Topics Concern  . Not on file  Social History Narrative  . Not on file   Social Determinants of Health   Financial Resource Strain:   . Difficulty of Paying Living Expenses: Not on file  Food Insecurity:   . Worried About Programme researcher, broadcasting/film/video in the Last Year: Not on file  . Ran Out of Food in the Last Year: Not on file  Transportation Needs:   . Lack of Transportation (Medical): Not on file  . Lack of Transportation (Non-Medical): Not on  file  Physical Activity:   . Days of Exercise per Week: Not on file  . Minutes of Exercise per Session: Not on file  Stress:   . Feeling of Stress : Not on file  Social Connections:   . Frequency of Communication with Friends and Family: Not on file  . Frequency of Social Gatherings with Friends and Family: Not on file  . Attends Religious Services: Not on file  . Active Member of Clubs or Organizations: Not on file  . Attends Banker Meetings: Not on file  . Marital Status: Not on file  Intimate Partner Violence:   . Fear of Current or Ex-Partner: Not on file  . Emotionally Abused: Not on file  . Physically Abused: Not on file  . Sexually Abused: Not on file    Review of Systems: See HPI, otherwise negative ROS  Impression/Plan: Teresa Vincent is here for an EGD due to reflux despite PPI therapy and colonoscopy to be performed for rectal bleeding.  The risks of the procedure including infection, bleed, or perforation as well as benefits, limitations, alternatives and imponderables have been reviewed with the patient. Questions have been  answered. All parties agreeable.

## 2020-02-18 ENCOUNTER — Telehealth: Payer: Self-pay

## 2020-02-18 ENCOUNTER — Other Ambulatory Visit: Payer: Self-pay

## 2020-02-18 DIAGNOSIS — E890 Postprocedural hypothyroidism: Secondary | ICD-10-CM

## 2020-02-18 DIAGNOSIS — E782 Mixed hyperlipidemia: Secondary | ICD-10-CM

## 2020-02-18 MED ORDER — LEVOTHYROXINE SODIUM 150 MCG PO TABS: ORAL_TABLET | ORAL | 0 refills | Status: DC

## 2020-02-18 NOTE — Telephone Encounter (Signed)
Pt said that [facility] does not have the refill for levothyroxine (SYNTHROID) 150 MCG tablet. Can you re send?

## 2020-02-18 NOTE — Telephone Encounter (Signed)
Orders updated and sent to [facility]. 

## 2020-02-18 NOTE — Telephone Encounter (Signed)
Can you update these orders because she is not going to go until end of December and change it to [facility], please

## 2020-02-18 NOTE — Telephone Encounter (Signed)
resent

## 2020-02-19 ENCOUNTER — Other Ambulatory Visit: Payer: Self-pay

## 2020-02-19 LAB — SURGICAL PATHOLOGY

## 2020-02-23 ENCOUNTER — Encounter (HOSPITAL_COMMUNITY): Payer: Self-pay | Admitting: Internal Medicine

## 2020-02-25 ENCOUNTER — Ambulatory Visit: Payer: 59 | Admitting: "Endocrinology

## 2020-03-24 ENCOUNTER — Ambulatory Visit: Payer: 59 | Admitting: "Endocrinology

## 2020-04-09 ENCOUNTER — Ambulatory Visit: Payer: 59 | Admitting: Gastroenterology

## 2020-04-13 ENCOUNTER — Ambulatory Visit: Payer: 59 | Admitting: Gastroenterology

## 2020-04-21 ENCOUNTER — Telehealth: Payer: Self-pay | Admitting: Family Medicine

## 2020-04-21 NOTE — Telephone Encounter (Signed)
Pharmacy requesting refill on Alprazolam 0.5 mg tablet. Take one tablet po once daily prn for sleep. Please advise. Thank you

## 2020-04-23 NOTE — Telephone Encounter (Signed)
Pt has not established care.  Needs appt. Dr. Ladona Ridgel

## 2020-04-23 NOTE — Telephone Encounter (Signed)
Please contact patient to have her set up appt. Thank you 

## 2020-04-24 LAB — LIPID PANEL
Cholesterol: 167 mg/dL (ref <200)
HDL: 38 mg/dL — ABNORMAL LOW (ref >=50)
LDL Cholesterol (Calc): 98 mg/dL (calc)
Non-HDL Cholesterol (Calc): 129 mg/dL (calc) (ref <130)
Total CHOL/HDL Ratio: 4.4 (calc) (ref <5.0)
Triglycerides: 214 mg/dL — ABNORMAL HIGH (ref <150)

## 2020-04-24 LAB — T4, FREE: Free T4: 1.4 ng/dL (ref 0.8–1.8)

## 2020-04-24 LAB — TSH: TSH: 1.22 mIU/L

## 2020-04-26 ENCOUNTER — Other Ambulatory Visit: Payer: Self-pay

## 2020-04-26 ENCOUNTER — Ambulatory Visit (INDEPENDENT_AMBULATORY_CARE_PROVIDER_SITE_OTHER): Payer: 59 | Admitting: Family Medicine

## 2020-04-26 ENCOUNTER — Encounter: Payer: Self-pay | Admitting: Family Medicine

## 2020-04-26 VITALS — BP 132/78 | HR 85 | Temp 97.3°F | Ht 67.0 in | Wt 252.6 lb

## 2020-04-26 DIAGNOSIS — I10 Essential (primary) hypertension: Secondary | ICD-10-CM | POA: Diagnosis not present

## 2020-04-26 DIAGNOSIS — F411 Generalized anxiety disorder: Secondary | ICD-10-CM

## 2020-04-26 DIAGNOSIS — Z981 Arthrodesis status: Secondary | ICD-10-CM

## 2020-04-26 DIAGNOSIS — F5101 Primary insomnia: Secondary | ICD-10-CM

## 2020-04-26 DIAGNOSIS — M5442 Lumbago with sciatica, left side: Secondary | ICD-10-CM

## 2020-04-26 DIAGNOSIS — G8929 Other chronic pain: Secondary | ICD-10-CM

## 2020-04-26 MED ORDER — METOPROLOL SUCCINATE ER 50 MG PO TB24
50.0000 mg | ORAL_TABLET | Freq: Every day | ORAL | 1 refills | Status: DC
Start: 2020-04-26 — End: 2020-07-20

## 2020-04-26 MED ORDER — CLONAZEPAM 0.5 MG PO TABS: ORAL_TABLET | ORAL | 0 refills | Status: DC

## 2020-04-26 MED ORDER — INDOMETHACIN 50 MG PO CAPS: 50.0000 mg | ORAL_CAPSULE | Freq: Three times a day (TID) | ORAL | 1 refills | Status: DC

## 2020-04-26 MED ORDER — LISINOPRIL 20 MG PO TABS
20.0000 mg | ORAL_TABLET | Freq: Every day | ORAL | 1 refills | Status: DC
Start: 2020-04-26 — End: 2020-09-10

## 2020-04-26 MED ORDER — VENLAFAXINE HCL ER 37.5 MG PO CP24: 37.5000 mg | ORAL_CAPSULE | Freq: Every day | ORAL | 1 refills | Status: DC

## 2020-04-26 NOTE — Progress Notes (Signed)
Patient ID: Teresa Vincent, female    DOB: 23-Jul-1980, 40 y.o.   MRN: [id number]   Chief Complaint  Patient presents with  . Hypertension   Subjective:    HPI Pt here to establish care. Pt has h/o lumbar pain/surgery and HTN. Pt not able to be active due to back, knee pain and thyroid disease. Pt was on 5mg  Oxycodone for back pain but Dr.Steve "took her off them." Pt states that she took one half of a 5 mg tablet only as needed. Pt states one script would last 6 months.  Per the chart appears pt was referred to pain management in 3/21. Pt doesn't know why she's not seeing them anymore.  Pt taking bp meds daily.  No new concerns. Has h/o hld.   Seeing Dr. 4/21 for thyroid disease. Had some labs recently.   Not seeing gyn.  Had partial hysterectomy. Still has ovaries. In 2/18.  Not needing pap smear H/o endometriosis and cysts on ovaries.  H/o lumbar fusion- 2x surgery. Back surgery 12/19. Used to see a back doctor.  Pain in left lower back pain. Hasn't been there in a while, last seen a few months ago. Wanted to do an xray or mri. Had CT lumbar spine-  Pt found to be allergic to iodine contrast and bupivocaine. Pt was on steroids to counter act this.  Went to see ortho for left knee.  Had injection in left knee, it is helping. Pt was referred to pain management in 12/20 per last pcp. Has had h/o spinal injections 6x.   H/o anxiety and insomnia- long standing per pt.  Has tried effexor in past and was on celexa.  Pt wasn't able to tolerate celexa, due to having a feeling of "not caring." Pt tried amytriptaline in past and pt didn't like it, made her groggy for days.  Pt has been on xanax 0.5mg  1/2 tab qhs.  Pt stating taking it daily. Pt stating this helps with her anxiety, insomnia, and RLS. Pt feels that she isn't depressed.  She does have anxiety and insomnia. Pt was given effexor in past, she is not sure why it was discontinued.  She said she did well on  this medication.  Medical History Teresa Vincent has a past medical history of Abnormal Pap smear of cervix, ANXIETY (08/30/2007), Asthma, Chronic back pain, Claustrophobia, Complication of anesthesia, GERD (gastroesophageal reflux disease), History of kidney stones, Hypertension, HYPOTHYROIDISM, POST-RADIATION (02/08/2009), INSOMNIA (08/30/2007), MIGRAINE HEADACHE (08/30/2007), Neck pain, Palpitations, Sciatica, Sleep apnea, and Stroke (HCC) (2010).   Outpatient Encounter Medications as of 04/26/2020  Medication Sig  . Aspirin-Salicylamide-Caffeine (BC HEADACHE PO) Take 1 packet by mouth daily as needed (headaches).  06/24/2020 atorvastatin (LIPITOR) 20 MG tablet Take 1 tablet by mouth once daily  . cholestyramine (QUESTRAN) 4 GM/DOSE powder One scoop BID prn diarrhea (Patient taking differently: Take 4 g by mouth 2 (two) times daily as needed (diarrhea).)  . clonazePAM (KLONOPIN) 0.5 MG tablet Take 1/2 tab p.o. qhs for insomnia.  . cyclobenzaprine (FLEXERIL) 10 MG tablet Take 1 tablet (10 mg total) by mouth 3 (three) times daily as needed. for muscle spams (Patient taking differently: Take 10 mg by mouth daily as needed for muscle spasms.)  . Evening Primrose Oil 1000 MG CAPS Take 1,000 mg by mouth 2 (two) times a week.  Marland Kitchen ibuprofen (ADVIL) 200 MG tablet Take 600-800 mg by mouth every 6 (six) hours as needed for headache or moderate pain.  . indomethacin (INDOCIN) 50  MG capsule Take 1 capsule (50 mg total) by mouth 3 (three) times daily with meals.  Marland Kitchen levothyroxine (SYNTHROID) 150 MCG tablet TAKE 1 TABLET BY MOUTH ONCE DAILY BEFORE BREAKFAST  . omeprazole (PRILOSEC) 40 MG capsule Take 1 capsule (40 mg total) by mouth 2 (two) times daily before a meal.  . ondansetron (ZOFRAN-ODT) 4 MG disintegrating tablet Take one tablet every 8 hours prn (Patient taking differently: Take 4 mg by mouth every 8 (eight) hours as needed for nausea or vomiting.)  . venlafaxine XR (EFFEXOR XR) 37.5 MG 24 hr capsule Take 1 capsule (37.5 mg  total) by mouth daily with breakfast.  . [DISCONTINUED] ALPRAZolam (XANAX) 0.5 MG tablet TAKE 1 TABLET BY MOUTH ONCE DAILY AT BEDTIME AS NEEDED FOR SLEEP (Patient taking differently: Take 0.25 mg by mouth at bedtime as needed for sleep.)  . [DISCONTINUED] lisinopril (ZESTRIL) 20 MG tablet Take 20 mg by mouth daily.  . [DISCONTINUED] metoprolol succinate (TOPROL-XL) 50 MG 24 hr tablet Take 50 mg by mouth daily. Take with or immediately following a meal.  . lisinopril (ZESTRIL) 20 MG tablet Take 1 tablet (20 mg total) by mouth daily.  . metoprolol succinate (TOPROL-XL) 50 MG 24 hr tablet Take 1 tablet (50 mg total) by mouth daily. Take with or immediately following a meal.  . [DISCONTINUED] alum & mag hydroxide-simeth (MAALOX/MYLANTA) 200-200-20 MG/5ML suspension Take 30 mLs by mouth every 6 (six) hours as needed for indigestion or heartburn.   Facility-Administered Encounter Medications as of 04/26/2020  Medication  . methylPREDNISolone acetate (DEPO-MEDROL) injection 40 mg     Review of Systems  Constitutional: Negative for chills and fever.  HENT: Negative for congestion, rhinorrhea and sore throat.   Respiratory: Negative for cough, shortness of breath and wheezing.   Cardiovascular: Negative for chest pain and leg swelling.  Gastrointestinal: Negative for abdominal pain, diarrhea, nausea and vomiting.  Genitourinary: Negative for dysuria and frequency.  Musculoskeletal: Negative for arthralgias and back pain.  Skin: Negative for rash.  Neurological: Negative for dizziness, weakness and headaches.  Psychiatric/Behavioral: Negative for dysphoric mood, self-injury, sleep disturbance and suicidal ideas. The patient is nervous/anxious.      Vitals BP 132/78   Pulse 85   Temp (!) 97.3 F (36.3 C)   Ht 5\' 7"  (1.702 m)   Wt 252 lb 9.6 oz (114.6 kg)   LMP 02/25/2016   SpO2 98%   BMI 39.56 kg/m   Objective:   Physical Exam Vitals and nursing note reviewed.  Constitutional:       Appearance: Normal appearance.  HENT:     Head: Normocephalic and atraumatic.     Nose: Nose normal.     Mouth/Throat:     Mouth: Mucous membranes are moist.     Pharynx: Oropharynx is clear.  Eyes:     Extraocular Movements: Extraocular movements intact.     Conjunctiva/sclera: Conjunctivae normal.     Pupils: Pupils are equal, round, and reactive to light.  Cardiovascular:     Rate and Rhythm: Normal rate and regular rhythm.     Pulses: Normal pulses.     Heart sounds: Normal heart sounds.  Pulmonary:     Effort: Pulmonary effort is normal.     Breath sounds: Normal breath sounds. No wheezing, rhonchi or rales.  Musculoskeletal:        General: Normal range of motion.     Right lower leg: No edema.     Left lower leg: No edema.  Skin:  General: Skin is warm and dry.     Findings: No lesion or rash.  Neurological:     General: No focal deficit present.     Mental Status: She is alert and oriented to person, place, and time.  Psychiatric:        Mood and Affect: Mood normal.        Behavior: Behavior normal.        Thought Content: Thought content normal.        Judgment: Judgment normal.      Assessment and Plan   1. Essential hypertension, benign  2. Chronic left-sided low back pain with left-sided sciatica - Ambulatory referral to Pain Clinic  3. S/P lumbar fusion - Ambulatory referral to Pain Clinic  4. Anxiety state  5. Primary insomnia   Htn- stable. Cont meds.  Anxiety/ insomnia- Reviewed PMP. Pt give small course of Klonapin 0.5mg  for anxiety/insomnia.  Pt to restart effexor 37.5mg  xr.  See how pt is doing on next visit.  And eventually would recommending tapering off the klonapin and trying trazodone for insomnia.  Chronic back pain and sciatica- recommending starting indomethacin tid prn pain. Heat/ice to back and gave referral to pain management.  F/u 4 wks with phone visit.

## 2020-04-29 ENCOUNTER — Encounter: Payer: Self-pay | Admitting: "Endocrinology

## 2020-04-29 ENCOUNTER — Other Ambulatory Visit: Payer: Self-pay

## 2020-04-29 ENCOUNTER — Ambulatory Visit (INDEPENDENT_AMBULATORY_CARE_PROVIDER_SITE_OTHER): Payer: 59 | Admitting: "Endocrinology

## 2020-04-29 VITALS — BP 112/76 | HR 72 | Ht 67.0 in | Wt 249.0 lb

## 2020-04-29 DIAGNOSIS — E782 Mixed hyperlipidemia: Secondary | ICD-10-CM

## 2020-04-29 DIAGNOSIS — E0500 Thyrotoxicosis with diffuse goiter without thyrotoxic crisis or storm: Secondary | ICD-10-CM

## 2020-04-29 DIAGNOSIS — E890 Postprocedural hypothyroidism: Secondary | ICD-10-CM | POA: Diagnosis not present

## 2020-04-29 DIAGNOSIS — R7303 Prediabetes: Secondary | ICD-10-CM | POA: Diagnosis not present

## 2020-04-29 MED ORDER — LEVOTHYROXINE SODIUM 150 MCG PO TABS: ORAL_TABLET | ORAL | 1 refills | Status: DC

## 2020-04-29 NOTE — Progress Notes (Signed)
04/29/2020    Endocrinology follow-up note   Subjective:    Patient ID: Teresa Vincent, female    DOB: 1980-10-22, PCP [name], MD  Past Medical History:  Diagnosis Date  . Abnormal Pap smear of cervix   . ANXIETY 08/30/2007  . Asthma   . Chronic back pain   . Claustrophobia    pt requests Versed before surgery.  . Complication of anesthesia    hx of waking up during anesthesia  . GERD (gastroesophageal reflux disease)   . History of kidney stones   . Hypertension   . 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 INSOMNIA 08/30/2007  . MIGRAINE HEADACHE 08/30/2007  . Neck pain   . Palpitations    occasionally couple times a week  . Sciatica   . Sleep apnea    cpap  . Stroke [facility]) 2010   states, no deficits and on no meds now.     Past Surgical History:  Procedure Laterality Date  . ABDOMINAL EXPOSURE N/A 05/29/2017   Procedure: ABDOMINAL EXPOSURE;  Surgeon: Chuck Hint, MD;  Location: [facility] OR;  Service: Vascular;  Laterality: N/A;  . ABDOMINAL HYSTERECTOMY    . ANTERIOR LUMBAR FUSION N/A 05/29/2017   Procedure: Anterior Lumbar Interbody Fusion  - Lumbar five sacral one;  Surgeon: Tia Alert, MD;  Location: [facility] OR;  Service: Neurosurgery;  Laterality: N/A;  . BIOPSY  02/17/2020   Procedure: BIOPSY;  Surgeon: Lanelle Bal, DO;  Location: [facility] SUITE;  Service: Endoscopy;;  gastric  . CARPAL TUNNEL RELEASE Bilateral   . CHOLECYSTECTOMY    . CHOLECYSTECTOMY N/A 12/29/2016   Procedure: LAPAROSCOPIC CHOLECYSTECTOMY;  Surgeon: [name], MD;  Location: [facility] ORS;  Service: General;  Laterality: N/A;  . COLONOSCOPY WITH PROPOFOL N/A 02/17/2020   Procedure: COLONOSCOPY WITH PROPOFOL;  Surgeon: Lanelle Bal, DO;  Location: [facility];  Service: Endoscopy;  Laterality: N/A;  1:00pm  . CYSTOSCOPY/URETEROSCOPY/HOLMIUM LASER/STENT PLACEMENT Right 06/25/2019   Procedure: CYSTOSCOPY WITH RIGHT RETROGRADE PYELOGRAM/RIGHT URETEROSCOPY/HOLMIUM  LASER APPLICATION RIGHT URETERAL CALCULUS/RIGHT URETERAL STENT PLACEMENT;  Surgeon: Malen Gauze, MD;  Location: [facility] ORS;  Service: Urology;  Laterality: Right;  . ESOPHAGOGASTRODUODENOSCOPY (EGD) WITH PROPOFOL N/A 02/17/2020   Procedure: ESOPHAGOGASTRODUODENOSCOPY (EGD) WITH PROPOFOL;  Surgeon: Lanelle Bal, DO;  [facility];  Service: Endoscopy;  Laterality: N/A;  . EXTRACORPOREAL SHOCK WAVE LITHOTRIPSY Right 04/10/2019   Procedure: EXTRACORPOREAL SHOCK WAVE LITHOTRIPSY (ESWL);  Surgeon: Noel Christmas, MD;  Location: [facility];  Service: Urology;  Laterality: Right;  . LAMINECTOMY WITH POSTERIOR LATERAL ARTHRODESIS LEVEL 1 N/A 02/28/2018   Procedure: Laminectomy and Foraminotomy Lumbar Five-Sacral One, posterolateral fusion and fixation;  Surgeon: Tia Alert, MD;  Location: [facility] OR;  Service: Neurosurgery;  Laterality: N/A;  posterior  . LUMBAR EPIDURAL INJECTION    . STERIOD INJECTION N/A 01/10/2017   Procedure: MINOR EXPAREL INJECTION;  Surgeon: [name], MD;  Location: [facility];  Service: General;  Laterality: N/A;  . TOTAL ABDOMINAL HYSTERECTOMY W/ BILATERAL SALPINGOOPHORECTOMY N/A   . TUBAL LIGATION  2003   Social History   Social History   Socioeconomic History  . Marital status: Married    Spouse name: Not on file  . Number of children: Not on file  . Years of education: Not on file  . Highest education level: Not on file  Occupational History  . Occupation: Doesn't work outside the home  Tobacco Use  . Smoking status:  Current Every Day Smoker    Packs/day: 1.00    Years: 26.00    Pack years: 26.00    Types: Cigarettes    Last attempt to quit: 06/19/2019    Years since quitting: 0.8  . Smokeless tobacco: Former Clinical biochemist  . Vaping Use: Every day  . Substances: Nicotine, Flavoring  Substance and Sexual Activity  . Alcohol use: Yes    Alcohol/week: 0.0 standard drinks    Comment: occasional  . Drug use: Not  Currently    Types: Marijuana    Comment: occasional  . Sexual activity: Not Currently    Birth control/protection: Surgical  Other Topics Concern  . Not on file  Social History Narrative  . Not on file   Social Determinants of Health   Financial Resource Strain: Not on file  Food Insecurity: Not on file  Transportation Needs: Not on file  Physical Activity: Not on file  Stress: Not on file  Social Connections: Not on file  Intimate Partner Violence: Not on file     Current Outpatient Medications  Medication Instructions  . Aspirin-Salicylamide-Caffeine (BC HEADACHE PO) 1 packet, Oral, Daily PRN  . atorvastatin (LIPITOR) 20 MG tablet Take 1 tablet by mouth once daily  . cholestyramine (QUESTRAN) 4 GM/DOSE powder One scoop BID prn diarrhea  . clonazePAM (KLONOPIN) 0.5 MG tablet Take 1/2 tab p.o. qhs for insomnia.  . cyclobenzaprine (FLEXERIL) 10 mg, Oral, 3 times daily PRN, for muscle spams  . Evening Primrose Oil 1,000 mg, Oral, 2 times weekly  . ibuprofen (ADVIL) 600-800 mg, Oral, Every 6 hours PRN  . indomethacin (INDOCIN) 50 mg, Oral, 3 times daily with meals  . levothyroxine (SYNTHROID) 150 MCG tablet TAKE 1 TABLET BY MOUTH ONCE DAILY BEFORE BREAKFAST  . lisinopril (ZESTRIL) 20 mg, Oral, Daily  . metoprolol succinate (TOPROL-XL) 50 mg, Oral, Daily, Take with or immediately following a meal.   . omeprazole (PRILOSEC) 40 mg, Oral, 2 times daily before meals  . ondansetron (ZOFRAN-ODT) 4 MG disintegrating tablet Take one tablet every 8 hours prn  . venlafaxine XR (EFFEXOR XR) 37.5 mg, Oral, Daily with breakfast      HPI  Teresa Vincent is a 40 year old female patient with medical history as above.  She is being seen in follow-up for hypothyroidism related to remote past thyroid ablation with I-131 for Graves' disease.     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 She was treated with various  doses of levothyroxine over the years currently on levothyroxine over the years.  Over the last several visits, her dose was adjusted and stabilized at current dose of 150 mcg p.o. daily before breakfast.    She denies palpitations, tremors, heat/cold intolerance.  Her previsit thyroid function tests are consistent with appropriate replacement.  Her complaint is that she is getting worsening eye symptoms including sensitivity, tearing alternating with redness and dryness at times.  She has seen ophthalmology in the remote past, however lost contact. -She also has hyperlipidemia on atorvastatin , prediabetes with A1c of 5.6%, and hypertension on lisinopril.  -She denies any family history of thyroid dysfunction. She denies any personal history of goiter nor family history of thyroid cancer.     Objective:     HEENT: She has prominent eyes, however no significant lid lag.  Recent Results (from the past 2160 hour(s))  SARS CORONAVIRUS 2 (TAT 6-24 HRS) Nasopharyngeal Nasopharyngeal Swab  Status: None   Collection Time: 02/16/20  9:03 AM   Specimen: Nasopharyngeal Swab  Result Value Ref Range   SARS Coronavirus 2 NEGATIVE NEGATIVE    Comment: (NOTE) SARS-CoV-2 target nucleic acids are NOT DETECTED.  The SARS-CoV-2 RNA is generally detectable in upper and lower respiratory specimens during the acute phase of infection. Negative results do not preclude SARS-CoV-2 infection, do not rule out co-infections with other pathogens, and should not be used as the sole basis for treatment or other patient management decisions. Negative results must be combined with clinical observations, patient history, and epidemiological information. The expected result is Negative.  Fact Sheet for Patients: HairSlick.no  Fact Sheet for Healthcare Providers: quierodirigir.com  This test is not yet approved or cleared by the [place] FDA and  has  been authorized for detection and/or diagnosis of SARS-CoV-2 by FDA under an Emergency Use Authorization (EUA). This EUA will remain  in effect (meaning this test can be used) for the duration of the COVID-19 declaration under Se ction 564(b)(1) of the Act, 21 U.S.C. section 360bbb-3(b)(1), unless the authorization is terminated or revoked sooner.  Performed at [facility], [address], [place], [place] [address]   Surgical pathology     Status: None   Collection Time: 02/17/20 11:50 AM  Result Value Ref Range   SURGICAL PATHOLOGY      SURGICAL PATHOLOGY CASE: [id number] PATIENT: Teresa Vincent Surgical Pathology Report     Clinical History: rectal bleeding, GERD     FINAL MICROSCOPIC DIAGNOSIS:  A. STOMACH, ANTRAL BODY, BIOPSY: - Antral and oxyntic mucosa with hyperemia. - Warthin-Starry negative for Helicobacter pylori. - No intestinal metaplasia, dysplasia or carcinoma.   GROSS DESCRIPTION:  Received in formalin are tan, soft tissue fragments that are submitted in toto. Number: 3 size: Range from 0.3 to 0.4 cm blocks: 1 Lovey Newcomer 02/18/2020)    Final Diagnosis performed by Jimmy Picket, MD.   Electronically signed 02/19/2020 Technical component performed at [facility], [address], [place], [place] [address].  Professional component performed at [employer], [address], [place], [place] [address].  Immunohistochemistry Technical component (if applicable) was performed at [facility]. [address], [place], [place] [address].   IMMUNOHISTOCHEMISTRY DISCLAIMER (if applicable): Some of these immunohistochemical stains may have been developed and the performance characteristics determine by [facility]. Some may not have been cleared or approved by the U.S. Food and Drug Administration. The FDA has determined that such clearance or approval is not necessary. This  test is used for clinical purposes. It should not be regarded as investigational or for research. This laboratory is certified under the Clinical Laboratory Improvement Amendments of 1988 (CLIA-88) as qualified to perform high complexity clinical laboratory testing.  The controls stained appropriately.   TSH     Status: None   Collection Time: 04/23/20  7:34 AM  Result Value Ref Range   TSH 1.22 mIU/L    Comment:           Reference Range .           > or = 20 Years  0.40-4.50 .                Pregnancy Ranges           First trimester    0.26-2.66           Second trimester   0.55-2.73  Third trimester    0.43-2.91   T4, free     Status: None   Collection Time: 04/23/20  7:34 AM  Result Value Ref Range   Free T4 1.4 0.8 - 1.8 ng/dL  Lipid panel     Status: Abnormal   Collection Time: 04/23/20  7:34 AM  Result Value Ref Range   Cholesterol 167 <200 mg/dL   HDL 38 (L) > OR = 50 mg/dL   Triglycerides 712 (H) <150 mg/dL    Comment: . If a non-fasting specimen was collected, consider repeat triglyceride testing on a fasting specimen if clinically indicated.  Perry Mount et al. J. of Clin. Lipidol. 2015;9:129-169. Marland Kitchen    LDL Cholesterol (Calc) 98 mg/dL (calc)    Comment: Reference range: <100 . Desirable range <100 mg/dL for primary prevention;   <70 mg/dL for patients with CHD or diabetic patients  with > or = 2 CHD risk factors. Marland Kitchen LDL-C is now calculated using the Martin-Hopkins  calculation, which is a validated novel method providing  better accuracy than the Friedewald equation in the  estimation of LDL-C.  Horald Pollen et al. Lenox Ahr. 1975;883(25): 2061-2068  (http://education.[employer].com/faq/FAQ164)    Total CHOL/HDL Ratio 4.4 <5.0 (calc)   Non-HDL Cholesterol (Calc) 129 <130 mg/dL (calc)    Comment: For patients with diabetes plus 1 major ASCVD risk  factor, treating to a non-HDL-C goal of <100 mg/dL  (LDL-C of <49 mg/dL) is considered a therapeutic   option.    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  She is advised to continue levothyroxine 150 mcg daily before breakfast.  - We discussed about the correct intake of her thyroid hormone, on empty stomach at fasting, with water, separated by at least 30 minutes from breakfast and other medications,  and separated by more than 4 hours from calcium, iron, multivitamins, acid reflux medications (PPIs). -Patient is made aware of the fact that thyroid hormone replacement is needed for life, dose to be adjusted by periodic monitoring of thyroid function tests.   2. Hyperlipidemia-  -She has responded significantly for atorvastatin treatment, with improving LDL.  She is advised to continue atorvastatin 20 mg p.o. daily at bedtime.    3) prediabetes-A1c of 5.8% -She is not on medications.  She was previously counseled on diet and exercise.  4) Graves' orbitopathy: She would benefit from evaluation by ophthalmology, will be referred to [facility] for better assessment.   - I advised patient to maintain close follow up with [name], MD for primary care needs.  -She will return in 6 months with previsit labs.    - Time spent on this patient care encounter:  30 minutes of which 50% was spent in  counseling and the rest reviewing  her current and  previous labs / studies and medications  doses and developing a plan for long term care, and documenting this care. Insiyah Hanover [employer]ocks  participated in the discussions, expressed understanding, and voiced agreement with the above plans.  All questions were answered to her satisfaction. she is encouraged to contact clinic should she have any questions or concerns prior to her return visit.   Marquis Lunch, MD Phone: [phone]  Fax: [phone]  -  This note was partially dictated with voice recognition software. Similar  sounding words can be transcribed inadequately or may not  be corrected upon review.  06/11/2018, 2:32 PM

## 2020-04-29 NOTE — Progress Notes (Signed)
Pt states she's experiencing worsening eye sensitivity, watering, staying dry and red x 2-68mths.

## 2020-05-07 ENCOUNTER — Encounter: Payer: Self-pay | Admitting: Physical Medicine and Rehabilitation

## 2020-05-16 ENCOUNTER — Other Ambulatory Visit: Payer: Self-pay | Admitting: "Endocrinology

## 2020-05-20 ENCOUNTER — Ambulatory Visit (INDEPENDENT_AMBULATORY_CARE_PROVIDER_SITE_OTHER): Payer: 59 | Admitting: Gastroenterology

## 2020-05-20 ENCOUNTER — Other Ambulatory Visit: Payer: Self-pay

## 2020-05-20 ENCOUNTER — Encounter: Payer: Self-pay | Admitting: Gastroenterology

## 2020-05-20 VITALS — BP 132/84 | HR 64 | Temp 97.1°F | Ht 67.0 in | Wt 246.4 lb

## 2020-05-20 DIAGNOSIS — K641 Second degree hemorrhoids: Secondary | ICD-10-CM | POA: Diagnosis not present

## 2020-05-20 NOTE — Patient Instructions (Signed)
I recommend adding fiber daily (benefiber 2 teaspoons mixed in beverage of your choice) to help with overall bowel habits.  Please call if any rectal pain.  Avoid prolonged toilet time, with a max of 2-3 minutes on toilet. Avoid straining and constipation.   We will see you in 2-3 weeks for repeat banding!   It was a pleasure to see you today. I want to create trusting relationships with patients to provide genuine, compassionate, and quality care. I value your feedback. If you receive a survey regarding your visit,  I greatly appreciate you taking time to fill this out.   Gelene Mink, PhD, ANP-BC [facility]

## 2020-05-20 NOTE — Progress Notes (Signed)
[facility] Banding Note:   Pleasant 40 year old female presenting today with history of symptomatic hemorrhoids, intermittent constipation/diarrhea. Notes prolonged toilet time. Rectal bleeding only symptoms. Colonoscopy Nov 2021: non-bleeding internal hemorrhoids, otherwise normal. No prolapsing tissue. She has very sensitive rectum per her report. No pain with defecation. No signs/symptoms of anal fissure. States she is just very hesitant when it comes to rectal procedures.   The patient presents with symptomatic grade 2 hemorrhoids, unresponsive to maximal medical therapy, requesting rubber band ligation of her hemorrhoidal disease. All risks, benefits, and alternative forms of therapy were described and informed consent was obtained.  Latex-free bands were used. The decision was made to band the right posterior internal hemorrhoid, and the [facility] O'Regan System was used to perform band ligation without complication. She did note some lower abdominal cramping and was assured this would subside. By end of procedure, this had subsided. NO pain. Digital anorectal examination was then performed to assure proper positioning of the band, and to adjust the banded tissue as required. The patient was discharged home without pain or other issues. Dietary and behavioral recommendations were given and (if necessary prescriptions were given), along with follow-up instructions. The patient will return in 2-3 weeks for followup and possible additional banding as required. She would prefer to not have appt in April due to getting married.   No complications were encountered and the patient tolerated the procedure well.  Gelene Mink, PhD, ANP-BC [facility]

## 2020-05-24 ENCOUNTER — Telehealth: Payer: 59 | Admitting: Family Medicine

## 2020-05-27 ENCOUNTER — Telehealth (INDEPENDENT_AMBULATORY_CARE_PROVIDER_SITE_OTHER): Payer: 59 | Admitting: Family Medicine

## 2020-05-27 ENCOUNTER — Other Ambulatory Visit: Payer: Self-pay

## 2020-05-27 DIAGNOSIS — F411 Generalized anxiety disorder: Secondary | ICD-10-CM | POA: Diagnosis not present

## 2020-05-27 DIAGNOSIS — M5442 Lumbago with sciatica, left side: Secondary | ICD-10-CM | POA: Diagnosis not present

## 2020-05-27 DIAGNOSIS — G8929 Other chronic pain: Secondary | ICD-10-CM | POA: Diagnosis not present

## 2020-05-27 MED ORDER — CLONAZEPAM 0.5 MG PO TABS: ORAL_TABLET | ORAL | 1 refills | Status: DC

## 2020-05-27 MED ORDER — DICLOFENAC SODIUM 75 MG PO TBEC: 75.0000 mg | DELAYED_RELEASE_TABLET | Freq: Two times a day (BID) | ORAL | 0 refills | Status: DC

## 2020-05-27 MED ORDER — VENLAFAXINE HCL ER 37.5 MG PO CP24: 37.5000 mg | ORAL_CAPSULE | Freq: Every day | ORAL | 1 refills | Status: DC

## 2020-05-27 NOTE — Progress Notes (Signed)
Patient ID: Teresa Vincent, female    DOB: June 26, 1980, 40 y.o.   MRN: [id number]  Virtual Visit via Telephone Note  I connected with Teresa Vincent on 05/27/20 at 11:00 AM EST by telephone and verified that I am speaking with the correct person using two identifiers.  Location: Patient: work Provider: office   I discussed the limitations, risks, security and privacy concerns of performing an evaluation and management service by telephone and the availability of in person appointments. I also discussed with the patient that there may be a patient responsible charge related to this service. The patient expressed understanding and agreed to proceed.    Chief Complaint  Patient presents with  . Back Pain  . Anxiety  . Depression   Subjective:    HPI  pt doing a med check up. F/u on anxiety/depression and low back pain. Not able to check bp at home.   Back pain- Pt quit taking the indomethacin and having reverse effect and bp went up.  Felt dizziness and about to pass out.  Had to call EMS to come due to feeling so bad.  bg went down.   Since quit taking it feeling better.  Seen neurosurgeon in past year in 2021.  Had injection in back but pt didn't follow back up with them and was allergic to back pain medication bupivacaine they gave in the injection.  Has appt already with pain management in April or may.  gad7 and phq9 done.   For anxiety- pt taking clonazepam and currently taking it 1/2 tab at night. Feels this is helping with the sleep. Taking the effexor and feeling it's helping with the anxiety and depression.   Medical History Teresa Vincent has a past medical history of Abnormal Pap smear of cervix, ANXIETY (08/30/2007), Asthma, Chronic back pain, Claustrophobia, Complication of anesthesia, GERD (gastroesophageal reflux disease), History of kidney stones, Hypertension, HYPOTHYROIDISM, POST-RADIATION (02/08/2009), INSOMNIA (08/30/2007), MIGRAINE HEADACHE (08/30/2007),  Neck pain, Palpitations, Sciatica, Sleep apnea, and Stroke (HCC) (2010).   Outpatient Encounter Medications as of 05/27/2020  Medication Sig  . Aspirin-Salicylamide-Caffeine (BC HEADACHE PO) Take 1 packet by mouth daily as needed (headaches).  Marland Kitchen atorvastatin (LIPITOR) 20 MG tablet Take 1 tablet by mouth once daily  . cholestyramine (QUESTRAN) 4 GM/DOSE powder One scoop BID prn diarrhea (Patient taking differently: Take 4 g by mouth 2 (two) times daily as needed (diarrhea).)  . cyclobenzaprine (FLEXERIL) 10 MG tablet Take 1 tablet (10 mg total) by mouth 3 (three) times daily as needed. for muscle spams (Patient taking differently: Take 10 mg by mouth daily as needed for muscle spasms.)  . diclofenac (VOLTAREN) 75 MG EC tablet Take 1 tablet (75 mg total) by mouth 2 (two) times daily.  . Evening Primrose Oil 1000 MG CAPS Take 1,000 mg by mouth 2 (two) times a week.  Marland Kitchen ibuprofen (ADVIL) 200 MG tablet Take 600-800 mg by mouth every 6 (six) hours as needed for headache or moderate pain.  Marland Kitchen levothyroxine (SYNTHROID) 150 MCG tablet TAKE 1 TABLET BY MOUTH ONCE DAILY BEFORE BREAKFAST  . lisinopril (ZESTRIL) 20 MG tablet Take 1 tablet (20 mg total) by mouth daily.  . metoprolol succinate (TOPROL-XL) 50 MG 24 hr tablet Take 1 tablet (50 mg total) by mouth daily. Take with or immediately following a meal.  . omeprazole (PRILOSEC) 40 MG capsule Take 1 capsule (40 mg total) by mouth 2 (two) times daily before a meal.  . ondansetron (ZOFRAN-ODT) 4 MG disintegrating tablet Take  one tablet every 8 hours prn (Patient taking differently: Take 4 mg by mouth every 8 (eight) hours as needed for nausea or vomiting.)  . [DISCONTINUED] clonazePAM (KLONOPIN) 0.5 MG tablet Take 1/2 tab p.o. qhs for insomnia.  . [DISCONTINUED] venlafaxine XR (EFFEXOR XR) 37.5 MG 24 hr capsule Take 1 capsule (37.5 mg total) by mouth daily with breakfast.  . clonazePAM (KLONOPIN) 0.5 MG tablet Take 1/2 tab p.o. qhs for insomnia.  Marland Kitchen venlafaxine  XR (EFFEXOR XR) 37.5 MG 24 hr capsule Take 1 capsule (37.5 mg total) by mouth daily with breakfast.   Facility-Administered Encounter Medications as of 05/27/2020  Medication  . methylPREDNISolone acetate (DEPO-MEDROL) injection 40 mg     Review of Systems  Constitutional: Negative for chills and fever.  HENT: Negative for congestion, rhinorrhea and sore throat.   Respiratory: Negative for cough, shortness of breath and wheezing.   Cardiovascular: Negative for chest pain and leg swelling.  Gastrointestinal: Negative for abdominal pain, diarrhea, nausea and vomiting.  Genitourinary: Negative for dysuria and frequency.  Musculoskeletal: Positive for back pain (chronic). Negative for arthralgias.  Skin: Negative for rash.  Neurological: Negative for dizziness, weakness and headaches.     Vitals LMP 02/25/2016   Objective:   Physical Exam  No PE due to phone visit.  Assessment and Plan   1. Anxiety state - clonazePAM (KLONOPIN) 0.5 MG tablet; Take 1/2 tab p.o. qhs for insomnia.  Dispense: 30 tablet; Refill: 1 - venlafaxine XR (EFFEXOR XR) 37.5 MG 24 hr capsule; Take 1 capsule (37.5 mg total) by mouth daily with breakfast.  Dispense: 30 capsule; Refill: 1  2. Chronic left-sided low back pain with left-sided sciatica - diclofenac (VOLTAREN) 75 MG EC tablet; Take 1 tablet (75 mg total) by mouth 2 (two) times daily.  Dispense: 60 tablet; Refill: 0    Unable to assess the back pain on our phone visit.  Pt to make in-person visit if needed. Pt has appt to f/u with pain management in April.  Gave diclofenac for pain in meantime, heat/ice to back and stretches.   Return in about 2 months (around 07/27/2020) for f/u anxiety.     Follow Up Instructions:    I discussed the assessment and treatment plan with the patient. The patient was provided an opportunity to ask questions and all were answered. The patient agreed with the plan and demonstrated an understanding of the  instructions.   The patient was advised to call back or seek an in-person evaluation if the symptoms worsen or if the condition fails to improve as anticipated.  I provided 12 minutes of non-face-to-face time during this encounter.

## 2020-05-28 ENCOUNTER — Telehealth: Payer: Self-pay | Admitting: Internal Medicine

## 2020-05-28 NOTE — Telephone Encounter (Signed)
PATIENT CALLED AND SAID THAT SHE HAS TO TAKE 2 OMEPRAZOLE 40MG  DAILY AND NEEDS HER PRESCRIPTION TO BE FOR 2 A DAY INSTEAD OF 1

## 2020-05-31 ENCOUNTER — Other Ambulatory Visit: Payer: Self-pay

## 2020-05-31 ENCOUNTER — Telehealth: Payer: Self-pay | Admitting: Internal Medicine

## 2020-05-31 NOTE — Telephone Encounter (Signed)
FYI: Spoke with the pt and the Rx will continue to stay @ 2 a day unless Dr. Marletta Lor changes it. Plus she still have 5 refills on that script. Unless you want it changed I will leave it until she needs to get another Rx after 5 months.

## 2020-05-31 NOTE — Telephone Encounter (Signed)
Thank you for letting me know.  Appears on her med list that the prescription is already written for twice daily?  If she needs a new prescription please send her in omeprazole 40 mg twice daily with 11 refills.  Thank you

## 2020-05-31 NOTE — Telephone Encounter (Signed)
Open in error

## 2020-05-31 NOTE — Telephone Encounter (Signed)
FYI: pt has switched back to twice a day (per Pharmacist) since her heartburn has increased and thought we should know this. It was just twice a day for 8 weeks then back to one. Sometime last week her heartburn got worse and this is what the pharmacist recommended.

## 2020-06-01 ENCOUNTER — Encounter: Payer: Self-pay | Admitting: Family Medicine

## 2020-06-23 ENCOUNTER — Encounter: Payer: 59 | Attending: Physical Medicine and Rehabilitation | Admitting: Physical Medicine and Rehabilitation

## 2020-06-23 ENCOUNTER — Encounter: Payer: Self-pay | Admitting: Physical Medicine and Rehabilitation

## 2020-06-23 ENCOUNTER — Other Ambulatory Visit: Payer: Self-pay

## 2020-06-23 VITALS — BP 124/73 | HR 75 | Temp 98.7°F | Ht 67.0 in | Wt 245.0 lb

## 2020-06-23 DIAGNOSIS — Z5181 Encounter for therapeutic drug level monitoring: Secondary | ICD-10-CM | POA: Diagnosis not present

## 2020-06-23 DIAGNOSIS — Z79891 Long term (current) use of opiate analgesic: Secondary | ICD-10-CM | POA: Diagnosis present

## 2020-06-23 DIAGNOSIS — G8929 Other chronic pain: Secondary | ICD-10-CM | POA: Insufficient documentation

## 2020-06-23 DIAGNOSIS — M25562 Pain in left knee: Secondary | ICD-10-CM | POA: Diagnosis not present

## 2020-06-23 DIAGNOSIS — Z981 Arthrodesis status: Secondary | ICD-10-CM | POA: Insufficient documentation

## 2020-06-23 NOTE — Addendum Note (Signed)
Addended by: Angela Nevin D on: 06/23/2020 11:01 AM   Modules accepted: Orders

## 2020-06-23 NOTE — Patient Instructions (Addendum)
1) Ginger 2) Blueberries 3) Salmon 4) Pumpkin seeds 5) dark chocolate 6) turmeric 7) tart cherries 8) virgin olive oil 9) chilli peppers 10) mint 11) garlic  Link to further information on diet for chronic pain: http://www.bray.com/   Discussed foods that can assist in weight loss:  1) Eggs  2) Leafy greens  3) Salmon  4) Cruciferous vegetables  5) Lean beef and chicken breast  6) Boiled potatoes  7) Tuna  8) Beans and legumes  9) Soups  10) Cottage cheese  11) Avocados  12) Apple cider vinegar  13) Nuts  14) Whole grains  15) Chili pepper  16) Fruit- berries are some of the best  17) Grapefruit  18) Chia seeds  19) Coconut oil  20) Full-fat yogurt

## 2020-06-23 NOTE — Progress Notes (Addendum)
Subjective:    Patient ID: Teresa Vincent, female    DOB: 1980/04/16, 40 y.o.   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  -Pain has been present for years -She has had two back surgeries.  -She has had 7 injections but got no relief from these.  -She has gone back to Dr. Yetta Barre who did her surgery.  -She was on oxycodone 5mg  that she broke in half to 2.5s which helped. This helped her to get through the day. She has been on it for years.  -Pain is 7/10.  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   2) left knee pain: -has had steroid injection with benefit.    Pain Inventory Average Pain 7 Pain Right Now 5 My pain is sharp, burning, dull, stabbing, tingling and aching  In the last 24 hours, has pain interfered with the following? General activity 10 Relation with others 10 Enjoyment of life 10 What TIME of day is your pain at its worst? morning , daytime, evening and night Sleep (in general) Poor  Pain is worse with: walking, bending, sitting, inactivity, standing and some activites Pain improves with: medication Relief from Meds: 7  walk without assistance ability to climb steps?  yes do you drive?  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 Hypertension Mother   . Thyroid disease Neg Hx    Social History   Socioeconomic History  . Marital status: Married    Spouse name: Not on file  . Number of children: Not on file  . Years of education: Not on file  . Highest education level: Not on file  Occupational History  . Occupation: Doesn't work outside the home  Tobacco Use  . Smoking status: Current Every Day Smoker     Packs/day: 1.00    Years: 26.00    Pack years: 26.00    Types: Cigarettes    Last attempt to quit: 06/19/2019    Years since quitting: 1.0  . Smokeless tobacco: Former 08/19/2019  . Vaping Use: Every day  . Substances: Nicotine, Flavoring  Substance and Sexual Activity  . Alcohol use: Yes    Alcohol/week: 0.0 standard drinks    Comment: occasional  . Drug use: Not Currently    Types: Marijuana    Comment: occasional  . Sexual activity: Not Currently    Birth control/protection: Surgical  Other Topics Concern  . Not on file  Social History Narrative  . Not on file   Social Determinants of Health   Financial Resource Strain: Not on file  Food Insecurity: Not on file  Transportation Needs: Not on file  Physical Activity: Not on file  Stress: Not on file  Social Connections: Not on file   Past Surgical History:  Procedure Laterality Date  . ABDOMINAL EXPOSURE N/A 05/29/2017   Procedure: ABDOMINAL EXPOSURE;  Surgeon: 07/29/2017, MD;  Location: [facility] OR;  Service: Vascular;  Laterality: N/A;  . ABDOMINAL HYSTERECTOMY    . ANTERIOR LUMBAR FUSION N/A 05/29/2017   Procedure: Anterior Lumbar Interbody Fusion  - Lumbar five sacral one;  Surgeon: Tia Alert, MD;  Location: [facility] OR;  Service: Neurosurgery;  Laterality: N/A;  . BIOPSY  02/17/2020   Procedure: BIOPSY;  Surgeon: Lanelle Bal, DO;  [facility];  Service: Endoscopy;;  gastric  . CARPAL TUNNEL RELEASE Bilateral   . CHOLECYSTECTOMY    . CHOLECYSTECTOMY N/A 12/29/2016   Procedure: LAPAROSCOPIC CHOLECYSTECTOMY;  Surgeon: [name], MD;  Location: [facility] ORS;  Service: General;  Laterality: N/A;  . COLONOSCOPY WITH PROPOFOL N/A 02/17/2020   non-bleeding internal hemorrhoids, otherwise normal.  . CYSTOSCOPY/URETEROSCOPY/HOLMIUM LASER/STENT PLACEMENT Right 06/25/2019   Procedure: CYSTOSCOPY WITH RIGHT RETROGRADE PYELOGRAM/RIGHT URETEROSCOPY/HOLMIUM LASER APPLICATION RIGHT URETERAL  CALCULUS/RIGHT URETERAL STENT PLACEMENT;  Surgeon: Malen Gauze, MD;  Location: [facility];  Service: Urology;  Laterality: Right;  . ESOPHAGOGASTRODUODENOSCOPY (EGD) WITH PROPOFOL N/A 02/17/2020   Procedure: ESOPHAGOGASTRODUODENOSCOPY (EGD) WITH PROPOFOL;  Surgeon: Lanelle Bal, DO;  [facility];  Service: Endoscopy;  Laterality: N/A;  . EXTRACORPOREAL SHOCK WAVE LITHOTRIPSY Right 04/10/2019   Procedure: EXTRACORPOREAL SHOCK WAVE LITHOTRIPSY (ESWL);  Surgeon: Noel Christmas, MD;  Location: [facility];  Service: Urology;  Laterality: Right;  . LAMINECTOMY WITH POSTERIOR LATERAL ARTHRODESIS LEVEL 1 N/A 02/28/2018   Procedure: Laminectomy and Foraminotomy Lumbar Five-Sacral One, posterolateral fusion and fixation;  Surgeon: Tia Alert, MD;  Location: [facility] OR;  Service: Neurosurgery;  Laterality: N/A;  posterior  . LUMBAR EPIDURAL INJECTION    . STERIOD INJECTION N/A 01/10/2017   Procedure: MINOR EXPAREL INJECTION;  Surgeon: [name], MD;  Location: [facility];  Service: General;  Laterality: N/A;  . TOTAL ABDOMINAL HYSTERECTOMY W/ BILATERAL SALPINGOOPHORECTOMY N/A   . TUBAL LIGATION  2003   Past Medical History:  Diagnosis Date  . Abnormal Pap smear of cervix   . ANXIETY 08/30/2007  . Asthma   . Chronic back pain   . Claustrophobia    pt requests Versed before surgery.  . Complication of anesthesia    hx of waking up during anesthesia  . GERD (gastroesophageal reflux disease)   . History of kidney stones   . Hypertension   . 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 INSOMNIA 08/30/2007  . MIGRAINE HEADACHE 08/30/2007  . Neck pain   . Palpitations    occasionally couple times a week  . Sciatica   . Sleep apnea    cpap  . Stroke [facility]) 2010   states, no deficits and on no meds now.   BP 124/73   Pulse 75   Temp 98.7 F (37.1 C)   Ht 5\' 7"  (1.702 m)   Wt 245 lb (111.1 kg)   LMP 02/25/2016   SpO2 98%   BMI 38.37 kg/m   Opioid  Risk Score:   Fall Risk Score:  `1  Depression screen PHQ 2/9  Depression screen [facility] 2/9 05/27/2020 04/26/2020 12/04/2017 10/11/2016 04/11/2016  Decreased Interest 0 0 0 0 0  Down, Depressed, Hopeless 0 1 0 0 0  PHQ - 2 Score 0 1 0 0 0  Altered sleeping 3 - - - -  Tired, decreased energy 1 - - - -  Change in appetite 3 - - - -  Feeling bad or failure about yourself  0 - - - -  Trouble concentrating 0 - - - -  Moving slowly or fidgety/restless 0 - - - -  Suicidal thoughts 0 - - - -  PHQ-9 Score 7 - - - -  Some recent data might be hidden  Review of Systems  Constitutional: Positive for unexpected weight change.  HENT: Negative.   Eyes: Negative.   Respiratory: Negative.   Cardiovascular: Positive for leg swelling.  Genitourinary: Negative.   Musculoskeletal: Positive for arthralgias and back pain.  Skin: Negative.   Allergic/Immunologic: Negative.   Neurological: Positive for numbness.       Tingling  Psychiatric/Behavioral: The patient is nervous/anxious.   All other systems reviewed and are negative.      Objective:   Physical Exam  Gen: no distress, normal appearing HEENT: oral mucosa pink and moist, NCAT Cardio: Reg rate Chest: normal effort, normal rate of breathing Abd: soft, non-distended Ext: no edema Psych: pleasant, normal affect Skin: intact Neuro: Alert and oriented x3 Musculoskeletal: Tenderness to palpation in bilateral lower back. Slump test reproduces pain in lower back. 5/5 strength throughout. Pain is present with both flexion and extension.    Assessment & Plan:  Mrs. Godshall is a 40 year old woman who presents to establish care for lumbar spinal stenosis.  1) Lumbar spinal stenosis with lumbar neurogenic claudication.  -Discussed benefits of exercise in reducing pain. -She is allergic to bupivicaine, she has tried surgery, injections, medication. Oxycodone 5mg  helped. She has never failed a drug screen. She works at . She takes one  oxycodone per day.  -Will obtain UDS and pain contract today. If results negative, can prescribe Percocet 5mg -325 monthly PRN.  Lumbar MRI reviewed as follows:  IMPRESSION: 1. No impingement identified in the lumbar spine. 2. 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 3. Degenerative disc disease at T11-12, L2- 3, L3-4, and L5-S1. 4. Cholelithiasis.  -Discussed following foods that may reduce pain: 1) Ginger 2) Blueberries 3) Salmon 4) Pumpkin seeds 5) dark chocolate 6) turmeric 7) tart cherries 8) virgin olive oil 9) chilli peppers 10) mint 11) garlic  Link to further information on diet for chronic pain: [employer]   2) Obesity: weight 245 lbs, BMI 38.37.  -Educated regarding health benefits of weight loss- for pain, general health, chronic disease prevention, immune health, mental health.  -Will monitor weight every visit.  -Consider Roobois tea daily.  -Discussed the benefits of intermittent fasting. -Discussed foods that can assist in weight loss:  1) Eggs  2) Leafy greens  3) Salmon  4) Cruciferous vegetables  5) Lean beef and chicken breast  6) Boiled potatoes  7) Tuna  8) Beans and legumes  9) Soups  10) Cottage cheese  11) Avocados  12) Apple cider vinegar  13) Nuts  14) Whole grains  15) Chili pepper  16) Fruit- berries are some of the best  17) Grapefruit  18) Chia seeds  19) Coconut oil  20) Full-fat yogurt

## 2020-06-29 LAB — TOXASSURE SELECT,+ANTIDEPR,UR

## 2020-06-30 ENCOUNTER — Other Ambulatory Visit: Payer: Self-pay | Admitting: Physical Medicine and Rehabilitation

## 2020-06-30 ENCOUNTER — Telehealth: Payer: Self-pay | Admitting: *Deleted

## 2020-06-30 MED ORDER — OXYCODONE-ACETAMINOPHEN 5-325 MG PO TABS
1.0000 | ORAL_TABLET | Freq: Every day | ORAL | 0 refills | Status: DC | PRN
Start: 2020-06-30 — End: 2020-07-21

## 2020-06-30 NOTE — Telephone Encounter (Signed)
Urine drug screen for this encounter is consistent for having no prescribed or unprescribed controlled medication. 

## 2020-07-01 ENCOUNTER — Ambulatory Visit
Admission: EM | Admit: 2020-07-01 | Discharge: 2020-07-01 | Disposition: A | Discharge status: Home / Self Care | Payer: 59 | Attending: Internal Medicine | Admitting: Internal Medicine

## 2020-07-01 ENCOUNTER — Encounter: Payer: Self-pay | Admitting: Emergency Medicine

## 2020-07-01 DIAGNOSIS — W57XXXA Bitten or stung by nonvenomous insect and other nonvenomous arthropods, initial encounter: Secondary | ICD-10-CM | POA: Diagnosis not present

## 2020-07-01 DIAGNOSIS — S60562A Insect bite (nonvenomous) of left hand, initial encounter: Secondary | ICD-10-CM | POA: Diagnosis not present

## 2020-07-01 MED ORDER — PREDNISONE 10 MG PO TABS: 20.0000 mg | ORAL_TABLET | Freq: Every day | ORAL | 0 refills | Status: DC

## 2020-07-01 MED ORDER — MUPIROCIN CALCIUM 2 % EX CREA: 1.0000 "application " | TOPICAL_CREAM | Freq: Two times a day (BID) | CUTANEOUS | 0 refills | Status: AC

## 2020-07-01 NOTE — ED Provider Notes (Signed)
[facility]    CSN: [id number] Arrival date & time: 07/01/20  [id number]      History   Chief Complaint No chief complaint on file.   HPI Teresa Vincent is a [age] y.o. female who presents with pain and insect bite of L small finger. She does not recall a particulaer time she felt a sting. She works with [employer] and is always poking herself with them. She has marks from rose scratches and is used to this, and states the bite site on her L small finger had a white head which she popped yesterday. This am woke up with her L hand feeling tight, and pain on the sting site with palpation, but is able to use her hands just fine.     Past Medical History:  Diagnosis Date  . Abnormal Pap smear of cervix   . ANXIETY 08/30/2007  . Asthma   . Chronic back pain   . Claustrophobia    pt requests Versed before surgery.  . Complication of anesthesia    hx of waking up during anesthesia  . GERD (gastroesophageal reflux disease)   . History of kidney stones   . Hypertension   . 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 INSOMNIA 08/30/2007  . MIGRAINE HEADACHE 08/30/2007  . Neck pain   . Palpitations    occasionally couple times a week  . Sciatica   . Sleep apnea    cpap  . Stroke [facility]) 2010   states, no deficits and on no meds now.    Patient Active Problem List   Diagnosis Date Noted  . Grade II hemorrhoids 05/20/2020  . Pain in left knee 11/26/2019  . Dysuria 06/18/2019  . Nephrolithiasis 04/23/2019  . S/P lumbar fusion 02/28/2018  . Prediabetes 12/04/2017  . Current smoker 12/04/2017  . S/P lumbar spinal fusion 05/29/2017  . Rectal bleeding 02/01/2017  . Post-operative pain 12/30/2016  . Calculus of gallbladder without cholecystitis without obstruction   . Mixed hyperlipidemia 10/11/2016  . 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/10/2015  . Class 2 severe obesity due to excess calories with serious comorbidity and body mass index (BMI)  of 36.0 to 36.9 in adult [facility](HCC) 06/10/2015  . Left-sided low back pain with left-sided sciatica 05/03/2015  . Essential hypertension, benign 12/30/2012  . Esophageal reflux 12/30/2012  . 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 Anxiety state 08/30/2007  . MIGRAINE HEADACHE 08/30/2007  . Insomnia 08/30/2007    Past Surgical History:  Procedure Laterality Date  . ABDOMINAL EXPOSURE N/A 05/29/2017   Procedure: ABDOMINAL EXPOSURE;  Surgeon: Chuck Hintickson, Christopher S, MD;  [facility] OR;  Service: Vascular;  Laterality: N/A;  . ABDOMINAL HYSTERECTOMY    . ANTERIOR LUMBAR FUSION N/A 05/29/2017   Procedure: Anterior Lumbar Interbody Fusion  - Lumbar five sacral one;  Surgeon: Tia AlertJones, David S, MD;  [facility];  Service: Neurosurgery;  Laterality: N/A;  . BIOPSY  02/17/2020   Procedure: BIOPSY;  Surgeon: Lanelle Balarver, Charles K, DO;  [facility];  Service: Endoscopy;;  gastric  . CARPAL TUNNEL RELEASE Bilateral   . CHOLECYSTECTOMY    . CHOLECYSTECTOMY N/A 12/29/2016   Procedure: LAPAROSCOPIC CHOLECYSTECTOMY;  Surgeon: Lucretia RoersBridges, Lindsay C, MD;  Location: [facility];  Service: General;  Laterality: N/A;  . COLONOSCOPY WITH PROPOFOL N/A 02/17/2020   non-bleeding internal hemorrhoids, otherwise normal.  . CYSTOSCOPY/URETEROSCOPY/HOLMIUM LASER/STENT PLACEMENT Right 06/25/2019   Procedure: CYSTOSCOPY WITH RIGHT RETROGRADE PYELOGRAM/RIGHT URETEROSCOPY/HOLMIUM LASER APPLICATION RIGHT URETERAL  CALCULUS/RIGHT URETERAL STENT PLACEMENT;  Surgeon: Malen Gauze, MD;  Location: [facility];  Service: Urology;  Laterality: Right;  . ESOPHAGOGASTRODUODENOSCOPY (EGD) WITH PROPOFOL N/A 02/17/2020   Procedure: ESOPHAGOGASTRODUODENOSCOPY (EGD) WITH PROPOFOL;  Surgeon: Lanelle Bal, DO;  [facility];  Service: Endoscopy;  Laterality: N/A;  . EXTRACORPOREAL SHOCK WAVE LITHOTRIPSY Right 04/10/2019   Procedure: EXTRACORPOREAL SHOCK WAVE LITHOTRIPSY (ESWL);  Surgeon: Noel Christmas,  MD;  [facility];  Service: Urology;  Laterality: Right;  . LAMINECTOMY WITH POSTERIOR LATERAL ARTHRODESIS LEVEL 1 N/A 02/28/2018   Procedure: Laminectomy and Foraminotomy Lumbar Five-Sacral One, posterolateral fusion and fixation;  Surgeon: Tia Alert, MD;  Location: [facility] OR;  Service: Neurosurgery;  Laterality: N/A;  posterior  . LUMBAR EPIDURAL INJECTION    . STERIOD INJECTION N/A 01/10/2017   Procedure: MINOR EXPAREL INJECTION;  Surgeon: [name], MD;  Location: [facility];  Service: General;  Laterality: N/A;  . TOTAL ABDOMINAL HYSTERECTOMY W/ BILATERAL SALPINGOOPHORECTOMY N/A   . TUBAL LIGATION  2003    OB History    Gravida  4   Para  1   Term  1   Preterm      AB  3   Living  1     SAB      IAB      Ectopic      Multiple      Live Births               Home Medications    Prior to Admission medications   Medication Sig Start Date End Date Taking? Authorizing Provider  mupirocin cream (BACTROBAN) 2 % Apply 1 application topically 2 (two) times daily for 7 days. 07/01/20 07/08/20 Yes Rodriguez-Southworth, Nettie Elm, PA-C  predniSONE (DELTASONE) 10 MG tablet Take 2 tablets (20 mg total) by mouth daily. 07/01/20  Yes Rodriguez-Southworth, Nettie Elm, PA-C  Aspirin-Salicylamide-Caffeine (BC HEADACHE PO) Take 1 packet by mouth daily as needed (headaches).    [provider]  atorvastatin (LIPITOR) 20 MG tablet Take 1 tablet by mouth once daily 05/17/20   Roma Kayser, MD  cholestyramine Lanetta Inch) 4 GM/DOSE powder One scoop BID prn diarrhea Patient taking differently: Take 4 g by mouth 2 (two) times daily as needed (diarrhea). 12/03/18   [name], MD  clonazePAM (KLONOPIN) 0.5 MG tablet Take 1/2 tab p.o. qhs for insomnia. 05/27/20   Laroy Apple M, DO  cyclobenzaprine (FLEXERIL) 10 MG tablet Take 1 tablet (10 mg total) by mouth 3 (three) times daily as needed. for muscle spams Patient taking differently: Take 10 mg  by mouth daily as needed for muscle spasms. 02/28/18   Tia Alert, MD  diclofenac (VOLTAREN) 75 MG EC tablet Take 1 tablet (75 mg total) by mouth 2 (two) times daily. 05/27/20   Ladona Ridgel, Malena M, DO  Evening Primrose Oil 1000 MG CAPS Take 1,000 mg by mouth 2 (two) times a week.    [provider]  ibuprofen (ADVIL) 200 MG tablet Take 600-800 mg by mouth every 6 (six) hours as needed for headache or moderate pain.    [provider]  levothyroxine (SYNTHROID) 150 MCG tablet TAKE 1 TABLET BY MOUTH ONCE DAILY BEFORE BREAKFAST 04/29/20   Roma Kayser, MD  lisinopril (ZESTRIL) 20 MG tablet Take 1 tablet (20 mg total) by mouth daily. 04/26/20   Laroy Apple M, DO  metoprolol succinate (TOPROL-XL) 50 MG 24 hr tablet Take 1 tablet (50 mg total) by  mouth daily. Take with or immediately following a meal. 04/26/20   Ladona Ridgel, Malena M, DO  omeprazole (PRILOSEC) 40 MG capsule Take 1 capsule (40 mg total) by mouth 2 (two) times daily before a meal. 02/17/20 08/15/20  Lanelle Bal, DO  ondansetron (ZOFRAN-ODT) 4 MG disintegrating tablet Take one tablet every 8 hours prn Patient taking differently: Take 4 mg by mouth every 8 (eight) hours as needed for nausea or vomiting. 08/11/19   [name], MD  oxyCODONE-acetaminophen (PERCOCET) 5-325 MG tablet Take 1 tablet by mouth daily as needed for severe pain. 06/30/20 06/30/21  Horton Chin, MD    Family History Family History  Problem Relation Age of Onset  . Hypertension Mother   . Thyroid disease Neg Hx     Social History Social History   Tobacco Use  . Smoking status: Current Every Day Smoker    Packs/day: 1.00    Years: 26.00    Pack years: 26.00    Types: Cigarettes    Last attempt to quit: 06/19/2019    Years since quitting: 1.0  . Smokeless tobacco: Former Clinical biochemist  . Vaping Use: Every day  . Substances: Nicotine, Flavoring  Substance Use Topics  . Alcohol use: Yes    Alcohol/week: 0.0 standard  drinks    Comment: occasional  . Drug use: Not Currently    Types: Marijuana    Comment: occasional     Allergies   Bupivacaine, Contrast media [iodinated diagnostic agents], Indomethacin, Adhesive [tape], and Celexa [citalopram hydrobromide]   Review of Systems Review of Systems  Constitutional: Negative for fever.  Eyes: Negative for discharge and itching.       Her lids felt swollen this am  Musculoskeletal: Negative for myalgias.  Skin: Positive for wound. Negative for color change, pallor and rash.  Hematological: Negative for adenopathy.     Physical Exam Triage Vital Signs ED Triage Vitals  Enc Vitals Group     BP 07/01/20 0823 124/83     Pulse Rate 07/01/20 0823 74     Resp 07/01/20 0823 18     Temp 07/01/20 0823 98.3 F (36.8 C)     Temp Source 07/01/20 0823 Oral     SpO2 07/01/20 0823 98 %     Weight --      Height --      Head Circumference --      Peak Flow --      Pain Score 07/01/20 0828 5     Pain Loc --      Pain Edu? --      Excl. in GC? --    No data found.  Updated Vital Signs BP 124/83   Pulse 74   Temp 98.3 F (36.8 C) (Oral)   Resp 18   LMP 02/25/2016   SpO2 98%   Visual Acuity Right Eye Distance:   Left Eye Distance:   Bilateral Distance:    Right Eye Near:   Left Eye Near:    Bilateral Near:     Physical Exam Vitals and nursing note reviewed.  Constitutional:      General: She is not in acute distress.    Appearance: She is obese. She is not toxic-appearing.  HENT:     Head: Normocephalic.     Right Ear: External ear normal.     Left Ear: External ear normal.  Eyes:     General: No scleral icterus.       Right eye:  No discharge.        Left eye: No discharge.     Extraocular Movements: Extraocular movements intact.     Conjunctiva/sclera: Conjunctivae normal.     Comments: Her upper lids look a little puffy  Pulmonary:     Effort: Pulmonary effort is normal.  Chest:  Breasts:     Left: No axillary adenopathy.     Musculoskeletal:        General: Normal range of motion.     Cervical back: Neck supple.  Lymphadenopathy:     Upper Body:     Left upper body: No axillary adenopathy.  Skin:    General: Skin is warm and dry.     Comments: Has several scratches and punctate marks on her hands, the one on dorsal L small finger is a little firm around the border, minimally tender and red, and no streaking noted. Her L hand is mildly swollen compared to the R.   Neurological:     Mental Status: She is alert and oriented to person, place, and time.     Gait: Gait normal.  Psychiatric:        Mood and Affect: Mood normal.        Behavior: Behavior normal.        Thought Content: Thought content normal.        Judgment: Judgment normal.      UC Treatments / Results  Labs (all labs ordered are listed, but only abnormal results are displayed) Labs Reviewed - No data to display  EKG   Radiology No results found.  Procedures Procedures (including critical care time)  Medications Ordered in UC Medications - No data to display  Initial Impression / Assessment and Plan / UC Course  I have reviewed the triage vital signs and the nursing notes. L small finger insect bite with minimal infection, but more inflammation. I placed her on Bactroban and Prednisone as noted. Advised to return if the mark on her small finger gets painful, red, larger or develops a streak   Final Clinical Impressions(s) / UC Diagnoses   Final diagnoses:  Insect bite hand, left, initial encounter     Discharge Instructions     Watch out for worse redness or pain, if so come back.     ED Prescriptions    Medication Sig Dispense Auth. Provider   predniSONE (DELTASONE) 10 MG tablet Take 2 tablets (20 mg total) by mouth daily. 10 tablet Rodriguez-Southworth, Nettie Elm, PA-C   mupirocin cream (BACTROBAN) 2 % Apply 1 application topically 2 (two) times daily for 7 days. 14 g Rodriguez-Southworth, Nettie Elm, PA-C     PDMP  not reviewed this encounter.   Garey Ham, [place] 07/01/20 [id number]

## 2020-07-01 NOTE — Discharge Instructions (Signed)
Watch out for worse redness or pain, if so come back.

## 2020-07-01 NOTE — ED Triage Notes (Signed)
Poss insect bite to LT fifth finger,  Now LT hand  is swollen and painful and eyes are puffy.

## 2020-07-06 ENCOUNTER — Encounter: Payer: Self-pay | Admitting: Emergency Medicine

## 2020-07-06 ENCOUNTER — Other Ambulatory Visit: Payer: Self-pay

## 2020-07-06 ENCOUNTER — Ambulatory Visit
Admission: EM | Admit: 2020-07-06 | Discharge: 2020-07-06 | Disposition: A | Discharge status: Home / Self Care | Payer: 59 | Attending: Family Medicine | Admitting: Family Medicine

## 2020-07-06 DIAGNOSIS — J069 Acute upper respiratory infection, unspecified: Secondary | ICD-10-CM

## 2020-07-06 DIAGNOSIS — J209 Acute bronchitis, unspecified: Secondary | ICD-10-CM

## 2020-07-06 DIAGNOSIS — M940 Chondrocostal junction syndrome [Tietze]: Secondary | ICD-10-CM

## 2020-07-06 MED ORDER — IBUPROFEN 800 MG PO TABS
800.0000 mg | ORAL_TABLET | Freq: Once | ORAL | Status: AC
  Administered 2020-07-06: 800 mg via ORAL

## 2020-07-06 MED ORDER — GUAIFENESIN-CODEINE 100-10 MG/5ML PO SYRP: 5.0000 mL | ORAL_SOLUTION | Freq: Three times a day (TID) | ORAL | 0 refills | Status: DC | PRN

## 2020-07-06 MED ORDER — AZITHROMYCIN 250 MG PO TABS: 250.0000 mg | ORAL_TABLET | Freq: Every day | ORAL | 0 refills | Status: DC

## 2020-07-06 MED ORDER — METHYLPREDNISOLONE SODIUM SUCC 125 MG IJ SOLR
125.0000 mg | Freq: Once | INTRAMUSCULAR | Status: AC
  Administered 2020-07-06: 125 mg via INTRAMUSCULAR

## 2020-07-06 MED ORDER — PREDNISONE 10 MG (21) PO TBPK: ORAL_TABLET | Freq: Every day | ORAL | 0 refills | Status: AC

## 2020-07-06 MED ORDER — ALBUTEROL SULFATE HFA 108 (90 BASE) MCG/ACT IN AERS
2.0000 | INHALATION_SPRAY | Freq: Once | RESPIRATORY_TRACT | Status: AC
  Administered 2020-07-06: 2 via RESPIRATORY_TRACT

## 2020-07-06 NOTE — Discharge Instructions (Addendum)
I have sent in azithromycin for you to take. Take 2 tablets today, then one tablet daily for the next 4 days.  I have sent in a prednisone taper for you to take for 6 days. 6 tablets on day one, 5 tablets on day two, 4 tablets on day three, 3 tablets on day four, 2 tablets on day five, and 1 tablet on day six.  We have given you a steroid injection in the office today  I have sent in cough syrup for you to take. This medication can make you sleepy. Do not drive while taking this medication.  We have given you an inhaler in the office today to use 2 puffs every 4-6 hours as needed for cough, shortness of breath, wheezing  May take tylenol/ibuprofen as needed for aches, headaches, fever  Follow up with this office or with primary care if symptoms are persisting.  Follow up in the ER for high fever, trouble swallowing, trouble breathing, other concerning symptoms.

## 2020-07-06 NOTE — ED Triage Notes (Signed)
Chest congestion and cough that started Monday

## 2020-07-06 NOTE — ED Provider Notes (Signed)
[facility]   [id number] 07/06/20 Arrival Time: 6803   CC: COVID symptoms  SUBJECTIVE: History from: patient.  Teresa Vincent is a 40 y.o. female who presents with cough, headache, chest pain with coughing, low grade fever. Denies sick exposure to COVID, flu or strep. Denies recent travel. Has negative history of Covid. Has completed Covid vaccines. Has completed flu vaccine. Reports that she is a current daily smoker. Has not taken OTC medications for this. There are no aggravating or alleviating factors. Denies previous symptoms in the past. Denies sinus pain, rhinorrhea, nausea, changes in bowel or bladder habits.    ROS: As per HPI.  All other pertinent ROS negative.     Past Medical History:  Diagnosis Date  . Abnormal Pap smear of cervix   . ANXIETY 08/30/2007  . Asthma   . Chronic back pain   . Claustrophobia    pt requests Versed before surgery.  . Complication of anesthesia    hx of waking up during anesthesia  . GERD (gastroesophageal reflux disease)   . History of kidney stones   . Hypertension   . 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 INSOMNIA 08/30/2007  . MIGRAINE HEADACHE 08/30/2007  . Neck pain   . Palpitations    occasionally couple times a week  . Sciatica   . Sleep apnea    cpap  . Stroke [facility]) 2010   states, no deficits and on no meds now.   Past Surgical History:  Procedure Laterality Date  . ABDOMINAL EXPOSURE N/A 05/29/2017   Procedure: ABDOMINAL EXPOSURE;  Surgeon: Chuck Hint, MD;  Location: [facility] OR;  Service: Vascular;  Laterality: N/A;  . ABDOMINAL HYSTERECTOMY    . ANTERIOR LUMBAR FUSION N/A 05/29/2017   Procedure: Anterior Lumbar Interbody Fusion  - Lumbar five sacral one;  Surgeon: Tia Alert, MD;  Location: [facility] OR;  Service: Neurosurgery;  Laterality: N/A;  . BIOPSY  02/17/2020   Procedure: BIOPSY;  Surgeon: Lanelle Bal, DO;  [facility];  Service: Endoscopy;;  gastric  . CARPAL TUNNEL  RELEASE Bilateral   . CHOLECYSTECTOMY    . CHOLECYSTECTOMY N/A 12/29/2016   Procedure: LAPAROSCOPIC CHOLECYSTECTOMY;  Surgeon: [name], MD;  Location: [facility] ORS;  Service: General;  Laterality: N/A;  . COLONOSCOPY WITH PROPOFOL N/A 02/17/2020   non-bleeding internal hemorrhoids, otherwise normal.  . CYSTOSCOPY/URETEROSCOPY/HOLMIUM LASER/STENT PLACEMENT Right 06/25/2019   Procedure: CYSTOSCOPY WITH RIGHT RETROGRADE PYELOGRAM/RIGHT URETEROSCOPY/HOLMIUM LASER APPLICATION RIGHT URETERAL CALCULUS/RIGHT URETERAL STENT PLACEMENT;  Surgeon: Malen Gauze, MD;  Location: [facility] ORS;  Service: Urology;  Laterality: Right;  . ESOPHAGOGASTRODUODENOSCOPY (EGD) WITH PROPOFOL N/A 02/17/2020   Procedure: ESOPHAGOGASTRODUODENOSCOPY (EGD) WITH PROPOFOL;  Surgeon: Lanelle Bal, DO;  [facility];  Service: Endoscopy;  Laterality: N/A;  . EXTRACORPOREAL SHOCK WAVE LITHOTRIPSY Right 04/10/2019   Procedure: EXTRACORPOREAL SHOCK WAVE LITHOTRIPSY (ESWL);  Surgeon: Noel Christmas, MD;  Location: [facility];  Service: Urology;  Laterality: Right;  . LAMINECTOMY WITH POSTERIOR LATERAL ARTHRODESIS LEVEL 1 N/A 02/28/2018   Procedure: Laminectomy and Foraminotomy Lumbar Five-Sacral One, posterolateral fusion and fixation;  Surgeon: Tia Alert, MD;  Location: [facility] OR;  Service: Neurosurgery;  Laterality: N/A;  posterior  . LUMBAR EPIDURAL INJECTION    . STERIOD INJECTION N/A 01/10/2017   Procedure: MINOR EXPAREL INJECTION;  Surgeon: [name], MD;  Location: [facility] ORS;  Service: General;  Laterality: N/A;  . TOTAL ABDOMINAL HYSTERECTOMY W/ BILATERAL SALPINGOOPHORECTOMY N/A   .  TUBAL LIGATION  2003   Allergies  Allergen Reactions  . Bupivacaine Swelling    Pt received CT contrast and Bupivacaine at the same time. Unsure which one caused the reaction. Dr. Mosetta Putt recommends a 13 hour prep before either one  . Contrast Media [Iodinated Diagnostic Agents] Swelling    Pt received  CT contrast and Bupivacaine at the same time. Unsure which one caused the reaction. Dr. Mosetta Putt recommends a 13 hour prep before either one  . Indomethacin Other (See Comments)    Dizziness, near syncope, bp went up and BG went down.  . Adhesive [Tape] Other (See Comments)    Skin irritation, patient prefers paper tape.   Budd Palmer [Citalopram Hydrobromide] Other (See Comments)    Make patient too carefree   No current facility-administered medications on file prior to encounter.   Current Outpatient Medications on File Prior to Encounter  Medication Sig Dispense Refill  . Aspirin-Salicylamide-Caffeine (BC HEADACHE PO) Take 1 packet by mouth daily as needed (headaches).    Marland Kitchen atorvastatin (LIPITOR) 20 MG tablet Take 1 tablet by mouth once daily 90 tablet 0  . cholestyramine (QUESTRAN) 4 GM/DOSE powder One scoop BID prn diarrhea (Patient taking differently: Take 4 g by mouth 2 (two) times daily as needed (diarrhea).) 378 g 3  . clonazePAM (KLONOPIN) 0.5 MG tablet Take 1/2 tab p.o. qhs for insomnia. 30 tablet 1  . cyclobenzaprine (FLEXERIL) 10 MG tablet Take 1 tablet (10 mg total) by mouth 3 (three) times daily as needed. for muscle spams (Patient taking differently: Take 10 mg by mouth daily as needed for muscle spasms.) 90 tablet 1  . diclofenac (VOLTAREN) 75 MG EC tablet Take 1 tablet (75 mg total) by mouth 2 (two) times daily. 60 tablet 0  . Evening Primrose Oil 1000 MG CAPS Take 1,000 mg by mouth 2 (two) times a week.    Marland Kitchen ibuprofen (ADVIL) 200 MG tablet Take 600-800 mg by mouth every 6 (six) hours as needed for headache or moderate pain.    Marland Kitchen levothyroxine (SYNTHROID) 150 MCG tablet TAKE 1 TABLET BY MOUTH ONCE DAILY BEFORE BREAKFAST 90 tablet 1  . lisinopril (ZESTRIL) 20 MG tablet Take 1 tablet (20 mg total) by mouth daily. 90 tablet 1  . metoprolol succinate (TOPROL-XL) 50 MG 24 hr tablet Take 1 tablet (50 mg total) by mouth daily. Take with or immediately following a meal. 90 tablet 1  .  mupirocin cream (BACTROBAN) 2 % Apply 1 application topically 2 (two) times daily for 7 days. 14 g 0  . omeprazole (PRILOSEC) 40 MG capsule Take 1 capsule (40 mg total) by mouth 2 (two) times daily before a meal. 60 capsule 5  . ondansetron (ZOFRAN-ODT) 4 MG disintegrating tablet Take one tablet every 8 hours prn (Patient taking differently: Take 4 mg by mouth every 8 (eight) hours as needed for nausea or vomiting.) 30 tablet 2  . oxyCODONE-acetaminophen (PERCOCET) 5-325 MG tablet Take 1 tablet by mouth daily as needed for severe pain. 30 tablet 0   Social History   Socioeconomic History  . Marital status: Married    Spouse name: Not on file  . Number of children: Not on file  . Years of education: Not on file  . Highest education level: Not on file  Occupational History  . Occupation: Doesn't work outside the home  Tobacco Use  . Smoking status: Current Every Day Smoker    Packs/day: 1.00    Years: 26.00    Pack  years: 26.00    Types: Cigarettes    Last attempt to quit: 06/19/2019    Years since quitting: 1.0  . Smokeless tobacco: Former Clinical biochemistUser  Vaping Use  . Vaping Use: Every day  . Substances: Nicotine, Flavoring  Substance and Sexual Activity  . Alcohol use: Yes    Alcohol/week: 0.0 standard drinks    Comment: occasional  . Drug use: Not Currently    Types: Marijuana    Comment: occasional  . Sexual activity: Not Currently    Birth control/protection: Surgical  Other Topics Concern  . Not on file  Social History Narrative  . Not on file   Social Determinants of Health   Financial Resource Strain: Not on file  Food Insecurity: Not on file  Transportation Needs: Not on file  Physical Activity: Not on file  Stress: Not on file  Social Connections: Not on file  Intimate Partner Violence: Not on file   Family History  Problem Relation Age of Onset  . Hypertension Mother   . Thyroid disease Neg Hx     OBJECTIVE:  Vitals:   07/06/20 0913  BP: 127/84  Pulse: 83   Resp: 18  Temp: 99.2 F (37.3 C)  TempSrc: Oral  SpO2: 97%     General appearance: alert; appears fatigued, but nontoxic; speaking in full sentences and tolerating own secretions HEENT: NCAT; Ears: EACs clear, TMs pearly gray; Eyes: PERRL.  EOM grossly intact. Sinuses: nontender; Nose: nares patent with clear rhinorrhea, Throat: oropharynx erythematous, cobblestoning present, tonsils non erythematous or enlarged, uvula midline  Neck: supple with LAD Lungs: unlabored respirations, symmetrical air entry; cough: moderate; no respiratory distress; diffuse wheezing throughout bilateral lung fields Heart: regular rate and rhythm.  Radial pulses 2+ symmetrical bilaterally Skin: warm and dry Psychological: alert and cooperative; normal mood and affect  LABS:  No results found for this or any previous visit (from the past 24 hour(s)).   ASSESSMENT & PLAN:  1. Acute bronchitis, unspecified organism   2. URI with cough and congestion   3. Costochondritis     Meds ordered this encounter  Medications  . albuterol (VENTOLIN HFA) 108 (90 Base) MCG/ACT inhaler 2 puff  . predniSONE (STERAPRED UNI-PAK 21 TAB) 10 MG (21) TBPK tablet    Sig: Take by mouth daily for 6 days. Take 6 tablets on day 1, 5 tablets on day 2, 4 tablets on day 3, 3 tablets on day 4, 2 tablets on day 5, 1 tablet on day 6    Dispense:  21 tablet    Refill:  0    Order Specific Question:   Supervising Provider    Answer:   Merrilee JanskyLAMPTEY, PHILIP O [id number]]  . azithromycin (ZITHROMAX) 250 MG tablet    Sig: Take 1 tablet (250 mg total) by mouth daily. Take first 2 tablets together, then 1 every day until finished.    Dispense:  6 tablet    Refill:  0    Order Specific Question:   Supervising Provider    Answer:   Merrilee JanskyLAMPTEY, PHILIP O [id number]]  . guaiFENesin-codeine (ROBITUSSIN AC) 100-10 MG/5ML syrup    Sig: Take 5 mLs by mouth 3 (three) times daily as needed for cough.    Dispense:  120 mL    Refill:  0    Order Specific  Question:   Supervising Provider    Answer:   Merrilee JanskyLAMPTEY, PHILIP O [id number]]  . methylPREDNISolone sodium succinate (SOLU-MEDROL) 125 mg/2 mL injection 125 mg  . ibuprofen (ADVIL)  tablet 800 mg    Solumedrol 125mg  IM in office today Albuterol inhaler 2 puffs in office today Prescribed azithromycin Prescribed steroid taper Prescribed Cheratussin Continue supportive care at home Get plenty of rest and push fluids Use OTC zyrtec for nasal congestion, runny nose, and/or sore throat Use OTC flonase for nasal congestion and runny nose Use medications daily for symptom relief Use OTC medications like ibuprofen or tylenol as needed fever or pain Call or go to the ED if you have any new or worsening symptoms such as fever, worsening cough, shortness of breath, chest tightness, chest pain, turning blue, changes in mental status.  Reviewed expectations re: course of current medical issues. Questions answered. Outlined signs and symptoms indicating need for more acute intervention. Patient verbalized understanding. After Visit Summary given.         , NP 07/06/20 1648

## 2020-07-15 ENCOUNTER — Ambulatory Visit (INDEPENDENT_AMBULATORY_CARE_PROVIDER_SITE_OTHER): Payer: 59 | Admitting: Gastroenterology

## 2020-07-15 ENCOUNTER — Other Ambulatory Visit: Payer: Self-pay

## 2020-07-15 ENCOUNTER — Encounter: Payer: Self-pay | Admitting: Gastroenterology

## 2020-07-15 DIAGNOSIS — K648 Other hemorrhoids: Secondary | ICD-10-CM | POA: Insufficient documentation

## 2020-07-15 NOTE — Patient Instructions (Signed)
We will see you in August for additional banding!  Please avoid straining, limit toilet time as you are doing, and avoid constipation.  Have a great summer!  I enjoyed seeing you again today! As you know, I value our relationship and want to provide genuine, compassionate, and quality care. I welcome your feedback. If you receive a survey regarding your visit,  I greatly appreciate you taking time to fill this out. See you next time!  Gelene Mink, PhD, ANP-BC [facility]

## 2020-07-15 NOTE — Progress Notes (Signed)
[facility] Banding Note:  Pleasant 40 year old female presenting today with history of symptomatic hemorrhoids, intermittent constipation/diarrhea. Notes prolonged toilet time. Rectal bleeding only symptoms. Colonoscopy Nov 2021: non-bleeding internal hemorrhoids, otherwise normal. She has undergone right posterior-banding in March 2022.   The patient presents with symptomatic grade 2 hemorrhoids, unresponsive to maximal medical therapy, requesting rubber band ligation of his/her hemorrhoidal disease. All risks, benefits, and alternative forms of therapy were described and informed consent was obtained.  The decision was made to band the left lateral internal hemorrhoid using latex-free bands, and the [facility] O'Regan System was used to perform band ligation without complication. Digital anorectal examination was then performed to assure proper positioning of the band, and to adjust the banded tissue as required. The patient was discharged home without pain or other issues. Dietary and behavioral recommendations were given and (if necessary prescriptions were given), along with follow-up instructions. The patient will return in several weeks for followup and possible additional banding as required.  No complications were encountered and the patient tolerated the procedure well.  Gelene Mink, PhD, ANP-BC [facility]

## 2020-07-21 ENCOUNTER — Encounter: Payer: 59 | Attending: Registered Nurse | Admitting: Registered Nurse

## 2020-07-21 ENCOUNTER — Encounter: Payer: Self-pay | Admitting: Registered Nurse

## 2020-07-21 ENCOUNTER — Other Ambulatory Visit: Payer: Self-pay

## 2020-07-21 VITALS — BP 103/68 | HR 80 | Temp 98.4°F | Ht 67.0 in | Wt 244.0 lb

## 2020-07-21 DIAGNOSIS — M79672 Pain in left foot: Secondary | ICD-10-CM | POA: Diagnosis present

## 2020-07-21 DIAGNOSIS — Z981 Arthrodesis status: Secondary | ICD-10-CM | POA: Insufficient documentation

## 2020-07-21 DIAGNOSIS — G8929 Other chronic pain: Secondary | ICD-10-CM | POA: Diagnosis present

## 2020-07-21 DIAGNOSIS — G894 Chronic pain syndrome: Secondary | ICD-10-CM | POA: Diagnosis present

## 2020-07-21 DIAGNOSIS — M5416 Radiculopathy, lumbar region: Secondary | ICD-10-CM | POA: Insufficient documentation

## 2020-07-21 DIAGNOSIS — Z5181 Encounter for therapeutic drug level monitoring: Secondary | ICD-10-CM | POA: Diagnosis present

## 2020-07-21 DIAGNOSIS — Z79891 Long term (current) use of opiate analgesic: Secondary | ICD-10-CM | POA: Diagnosis present

## 2020-07-21 MED ORDER — OXYCODONE-ACETAMINOPHEN 5-325 MG PO TABS: 1.0000 | ORAL_TABLET | Freq: Every day | ORAL | 0 refills | Status: DC | PRN

## 2020-07-21 NOTE — Progress Notes (Signed)
Subjective:    Patient ID: Teresa Vincent, female    DOB: 16-Apr-1980, 40 y.o.   MRN: [id number]  HPI: Teresa Vincent is a 40 y.o. female who returns for follow up appointment for chronic pain and medication refill. She states her pain is located in her lower back radiating into her left lower extremity and left foot pain. She rates her  Pain 4. Her current exercise regime is walking and going to [employer] Q[employer].   Teresa Vincent Morphine equivalent is 7.5 MME.She is also prescribed Clonazepam by Dr. Ladona Ridgel. We have discussed the black box warning of using opioids and benzodiazepines. I highlighted the dangers of using these drugs together and discussed the adverse events including respiratory suppression, overdose, cognitive impairment and importance of compliance with current regimen. We will continue to monitor and adjust as indicated.     Last UDS was on 06/23/2020, it was consistent.    Pain Inventory Average Pain 7 Pain Right Now 4 My pain is constant, sharp, burning, stabbing, tingling and aching  In the last 24 hours, has pain interfered with the following? General activity 6 Relation with others 5 Enjoyment of life 5 What TIME of day is your pain at its worst? morning , daytime, evening and night Sleep (in general) Fair  Pain is worse with: walking, bending, sitting, inactivity, standing and some activites Pain improves with: rest and medication Relief from Meds: 9  Family History  Problem Relation Age of Onset  . Hypertension Mother   . Thyroid disease Neg Hx    Social History   Socioeconomic History  . Marital status: Married    Spouse name: Not on file  . Number of children: Not on file  . Years of education: Not on file  . Highest education level: Not on file  Occupational History  . Occupation: Doesn't work outside the home  Tobacco Use  . Smoking status: Current Every Day Smoker    Packs/day: 1.00    Years: 26.00    Pack years: 26.00    Types:  Cigarettes    Last attempt to quit: 06/19/2019    Years since quitting: 1.0  . Smokeless tobacco: Former Clinical biochemist  . Vaping Use: Every day  . Substances: Nicotine, Flavoring  Substance and Sexual Activity  . Alcohol use: Yes    Alcohol/week: 0.0 standard drinks    Comment: occasional  . Drug use: Not Currently    Types: Marijuana    Comment: occasional  . Sexual activity: Not Currently    Birth control/protection: Surgical  Other Topics Concern  . Not on file  Social History Narrative  . Not on file   Social Determinants of Health   Financial Resource Strain: Not on file  Food Insecurity: Not on file  Transportation Needs: Not on file  Physical Activity: Not on file  Stress: Not on file  Social Connections: Not on file   Past Surgical History:  Procedure Laterality Date  . ABDOMINAL EXPOSURE N/A 05/29/2017   Procedure: ABDOMINAL EXPOSURE;  Surgeon: Chuck Hint, MD;  Location: [facility] OR;  Service: Vascular;  Laterality: N/A;  . ABDOMINAL HYSTERECTOMY    . ANTERIOR LUMBAR FUSION N/A 05/29/2017   Procedure: Anterior Lumbar Interbody Fusion  - Lumbar five sacral one;  Surgeon: Tia Alert, MD;  Location: [facility] OR;  Service: Neurosurgery;  Laterality: N/A;  . BIOPSY  02/17/2020   Procedure: BIOPSY;  Surgeon: Lanelle Bal, DO;  [facility];  Service: Endoscopy;;  gastric  . CARPAL TUNNEL RELEASE Bilateral   . CHOLECYSTECTOMY    . CHOLECYSTECTOMY N/A 12/29/2016   Procedure: LAPAROSCOPIC CHOLECYSTECTOMY;  Surgeon: [name], MD;  Location: [facility];  Service: General;  Laterality: N/A;  . COLONOSCOPY WITH PROPOFOL N/A 02/17/2020   non-bleeding internal hemorrhoids, otherwise normal.  . CYSTOSCOPY/URETEROSCOPY/HOLMIUM LASER/STENT PLACEMENT Right 06/25/2019   Procedure: CYSTOSCOPY WITH RIGHT RETROGRADE PYELOGRAM/RIGHT URETEROSCOPY/HOLMIUM LASER APPLICATION RIGHT URETERAL CALCULUS/RIGHT URETERAL STENT PLACEMENT;  Surgeon: Malen Gauze, MD;   Location: [facility];  Service: Urology;  Laterality: Right;  . ESOPHAGOGASTRODUODENOSCOPY (EGD) WITH PROPOFOL N/A 02/17/2020   Procedure: ESOPHAGOGASTRODUODENOSCOPY (EGD) WITH PROPOFOL;  Surgeon: Lanelle Bal, DO;  [facility];  Service: Endoscopy;  Laterality: N/A;  . EXTRACORPOREAL SHOCK WAVE LITHOTRIPSY Right 04/10/2019   Procedure: EXTRACORPOREAL SHOCK WAVE LITHOTRIPSY (ESWL);  Surgeon: Noel Christmas, MD;  Location: [facility];  Service: Urology;  Laterality: Right;  . LAMINECTOMY WITH POSTERIOR LATERAL ARTHRODESIS LEVEL 1 N/A 02/28/2018   Procedure: Laminectomy and Foraminotomy Lumbar Five-Sacral One, posterolateral fusion and fixation;  Surgeon: Tia Alert, MD;  Location: [facility] OR;  Service: Neurosurgery;  Laterality: N/A;  posterior  . LUMBAR EPIDURAL INJECTION    . STERIOD INJECTION N/A 01/10/2017   Procedure: MINOR EXPAREL INJECTION;  Surgeon: [name], MD;  Location: [facility];  Service: General;  Laterality: N/A;  . TOTAL ABDOMINAL HYSTERECTOMY W/ BILATERAL SALPINGOOPHORECTOMY N/A   . TUBAL LIGATION  2003   Past Surgical History:  Procedure Laterality Date  . ABDOMINAL EXPOSURE N/A 05/29/2017   Procedure: ABDOMINAL EXPOSURE;  Surgeon: Chuck Hint, MD;  Location: [facility] OR;  Service: Vascular;  Laterality: N/A;  . ABDOMINAL HYSTERECTOMY    . ANTERIOR LUMBAR FUSION N/A 05/29/2017   Procedure: Anterior Lumbar Interbody Fusion  - Lumbar five sacral one;  Surgeon: Tia Alert, MD;  Location: [facility] OR;  Service: Neurosurgery;  Laterality: N/A;  . BIOPSY  02/17/2020   Procedure: BIOPSY;  Surgeon: Lanelle Bal, DO;  [facility];  Service: Endoscopy;;  gastric  . CARPAL TUNNEL RELEASE Bilateral   . CHOLECYSTECTOMY    . CHOLECYSTECTOMY N/A 12/29/2016   Procedure: LAPAROSCOPIC CHOLECYSTECTOMY;  Surgeon: [name], MD;  Location: [facility];  Service: General;  Laterality: N/A;  . COLONOSCOPY WITH PROPOFOL N/A 02/17/2020    non-bleeding internal hemorrhoids, otherwise normal.  . CYSTOSCOPY/URETEROSCOPY/HOLMIUM LASER/STENT PLACEMENT Right 06/25/2019   Procedure: CYSTOSCOPY WITH RIGHT RETROGRADE PYELOGRAM/RIGHT URETEROSCOPY/HOLMIUM LASER APPLICATION RIGHT URETERAL CALCULUS/RIGHT URETERAL STENT PLACEMENT;  Surgeon: Malen Gauze, MD;  Location: [facility];  Service: Urology;  Laterality: Right;  . ESOPHAGOGASTRODUODENOSCOPY (EGD) WITH PROPOFOL N/A 02/17/2020   Procedure: ESOPHAGOGASTRODUODENOSCOPY (EGD) WITH PROPOFOL;  Surgeon: Lanelle Bal, DO;  [facility];  Service: Endoscopy;  Laterality: N/A;  . EXTRACORPOREAL SHOCK WAVE LITHOTRIPSY Right 04/10/2019   Procedure: EXTRACORPOREAL SHOCK WAVE LITHOTRIPSY (ESWL);  Surgeon: Noel Christmas, MD;  Location: [facility];  Service: Urology;  Laterality: Right;  . LAMINECTOMY WITH POSTERIOR LATERAL ARTHRODESIS LEVEL 1 N/A 02/28/2018   Procedure: Laminectomy and Foraminotomy Lumbar Five-Sacral One, posterolateral fusion and fixation;  Surgeon: Tia Alert, MD;  Location: [facility] OR;  Service: Neurosurgery;  Laterality: N/A;  posterior  . LUMBAR EPIDURAL INJECTION    . STERIOD INJECTION N/A 01/10/2017   Procedure: MINOR EXPAREL INJECTION;  Surgeon: [name], MD;  Location: [facility];  Service: General;  Laterality: N/A;  . TOTAL ABDOMINAL HYSTERECTOMY  W/ BILATERAL SALPINGOOPHORECTOMY N/A   . TUBAL LIGATION  2003   Past Medical History:  Diagnosis Date  . Abnormal Pap smear of cervix   . ANXIETY 08/30/2007  . Asthma   . Chronic back pain   . Claustrophobia    pt requests Versed before surgery.  . Complication of anesthesia    hx of waking up during anesthesia  . GERD (gastroesophageal reflux disease)   . History of kidney stones   . Hypertension   . 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 INSOMNIA 08/30/2007  . MIGRAINE HEADACHE 08/30/2007  . Neck pain   . Palpitations    occasionally couple times a week  . Sciatica   .  Sleep apnea    cpap  . Stroke [facility]) 2010   states, no deficits and on no meds now.   BP 103/68   Pulse 80   Temp 98.4 F (36.9 C)   Ht 5\' 7"  (1.702 m)   Wt 244 lb (110.7 kg)   LMP 02/25/2016   SpO2 98%   BMI 38.22 kg/m   Opioid Risk Score:   Fall Risk Score:  `1  Depression screen PHQ 2/9  Depression screen [facility] 06/23/2020 05/27/2020 04/26/2020 12/04/2017 10/11/2016 04/11/2016  Decreased Interest 3 0 0 0 0 0  Down, Depressed, Hopeless 0 0 1 0 0 0  PHQ - 2 Score 3 0 1 0 0 0  Altered sleeping 3 3 - - - -  Tired, decreased energy 1 1 - - - -  Change in appetite 2 3 - - - -  Feeling bad or failure about yourself  0 0 - - - -  Trouble concentrating 0 0 - - - -  Moving slowly or fidgety/restless 0 0 - - - -  Suicidal thoughts 0 0 - - - -  PHQ-9 Score 9 7 - - - -  Difficult doing work/chores Somewhat difficult - - - - -  Some recent data might be hidden   Review of Systems  Musculoskeletal: Positive for back pain.       Pain in left hip down to left ankle & foot.   All other systems reviewed and are negative.      Objective:   Physical Exam Vitals and nursing note reviewed.  Constitutional:      Appearance: Normal appearance.  Cardiovascular:     Rate and Rhythm: Normal rate and regular rhythm.     Pulses: Normal pulses.     Heart sounds: Normal heart sounds.  Pulmonary:     Effort: Pulmonary effort is normal.     Breath sounds: Normal breath sounds.  Musculoskeletal:     Cervical back: Normal range of motion and neck supple.     Comments: Normal Muscle Bulk and Muscle Testing Reveals:  Upper Extremities: Full ROM and Muscle Strength 5/5  Lumbar Paraspinal Tenderness: L-4-L-5 Lower Extremities: Full ROM and Muscle Strength 5/5 Arises from Table with ease Narrow Based Gait   Skin:    General: Skin is warm and dry.  Neurological:     Mental Status: She is alert and oriented to person, place, and time.  Psychiatric:        Mood and Affect: Mood normal.         Behavior: Behavior normal.           Assessment & Plan:  1. S/P Lumbar Spinal Fusion: S/P Laminectomy and Foraminotomy Lumbar Five-Sacral One, posterolateral fusion and fixation by Dr Yetta BarreJones on 02/28/2018. Continue  HEP as Tolerated. Continue to Monitor.  2. Left Lumbar Radiculitis.  Continue current medication regimen. Continue to Monitor.  3. Chronic Pain Syndrome: Refilled Oxycodone 5 mg/325 mg daily as needed for pain. We will continue the opioid monitoring program, this consists of regular clinic visits, examinations, urine drug screen, pill counts as well as use of [place] Controlled Substance Reporting system. A 12 month History has been reviewed on the [place] Controlled Substance Reporting System on 07/21/2020 4. Left Foot Pain: She has an appointment with Podiatry she states. Continue to monitor.   F/U in 1 month

## 2020-07-24 ENCOUNTER — Ambulatory Visit: Admission: EM | Admit: 2020-07-24 | Discharge: 2020-07-24 | Discharge status: Against Medical Advice | Payer: 59

## 2020-07-24 ENCOUNTER — Other Ambulatory Visit: Payer: Self-pay

## 2020-07-24 ENCOUNTER — Encounter: Payer: Self-pay | Admitting: Emergency Medicine

## 2020-07-24 DIAGNOSIS — Z5320 Procedure and treatment not carried out because of patient's decision for unspecified reasons: Secondary | ICD-10-CM

## 2020-07-24 NOTE — ED Triage Notes (Signed)
LT heel, top of foot and outer side of foot pain of since November , denies any injury.

## 2020-07-24 NOTE — ED Provider Notes (Signed)
Patient presented for evaluation of left foot pain ongoing since November. She is followed by [facility] and was seen for this problem and has a pending follow-up with podiatry. She has not had an injury of the left foot. When questioned about her pain medication she became irate and stated that she wasn't here for pain medicine. At no point was it indicated that she was here for pain medication, I attempting to ascertain if she was getting any pain relief from medication.   She became angry, stormed out of the exam room before any additional information could be ascertained and went into the lobby used profanity and threw her mask (patient access notified provider of behavior in lobby).    Bing Neighbors, FNP 07/24/20 [phone]

## 2020-08-04 ENCOUNTER — Ambulatory Visit: Payer: 59 | Admitting: Family Medicine

## 2020-08-13 ENCOUNTER — Other Ambulatory Visit: Payer: Self-pay | Admitting: "Endocrinology

## 2020-08-13 ENCOUNTER — Other Ambulatory Visit: Payer: Self-pay | Admitting: Internal Medicine

## 2020-08-20 ENCOUNTER — Ambulatory Visit: Payer: 59 | Admitting: Physical Medicine and Rehabilitation

## 2020-08-23 ENCOUNTER — Encounter: Payer: Self-pay | Admitting: Registered Nurse

## 2020-08-23 ENCOUNTER — Encounter: Payer: 59 | Attending: Registered Nurse | Admitting: Registered Nurse

## 2020-08-23 ENCOUNTER — Other Ambulatory Visit: Payer: Self-pay

## 2020-08-23 VITALS — BP 109/74 | HR 76 | Temp 98.5°F | Ht 67.0 in | Wt 244.4 lb

## 2020-08-23 DIAGNOSIS — G8929 Other chronic pain: Secondary | ICD-10-CM | POA: Diagnosis present

## 2020-08-23 DIAGNOSIS — Z981 Arthrodesis status: Secondary | ICD-10-CM

## 2020-08-23 DIAGNOSIS — G894 Chronic pain syndrome: Secondary | ICD-10-CM | POA: Diagnosis present

## 2020-08-23 DIAGNOSIS — M5416 Radiculopathy, lumbar region: Secondary | ICD-10-CM | POA: Diagnosis present

## 2020-08-23 DIAGNOSIS — Z5181 Encounter for therapeutic drug level monitoring: Secondary | ICD-10-CM

## 2020-08-23 DIAGNOSIS — Z79891 Long term (current) use of opiate analgesic: Secondary | ICD-10-CM | POA: Diagnosis present

## 2020-08-23 DIAGNOSIS — M79672 Pain in left foot: Secondary | ICD-10-CM | POA: Diagnosis present

## 2020-08-23 MED ORDER — OXYCODONE-ACETAMINOPHEN 5-325 MG PO TABS: 1.0000 | ORAL_TABLET | Freq: Every day | ORAL | 0 refills | Status: DC | PRN

## 2020-08-23 NOTE — Progress Notes (Signed)
Subjective:    Patient ID: Teresa Vincent, female    DOB: 1980-09-27, 40 y.o.   MRN: [id number]  HPI: Teresa Vincent is a 40 y.o. female who returns for follow up appointment for chronic pain and medication refill. She states her pain is located in her lower back mainly left side and left foot pain. She has an appointment with podiatry this afternoon she reports. She rates her pain 5. Her current exercise regime is walking and performing stretching exercises.  Teresa Vincent went to Urgent Care on 07/24/2020 reporting left foot pain, note was reviewed. Teresa Vincent states " she felt like she was being judged, due to being on oxycodone", her MME is only 8.00. Emotional support given and all questions answered. She has an appointment with podiatry today.   Teresa Vincent Morphine equivalent is 8.00 MME.  Last UDS was Performed on 06/23/2020, it was consistent.    Pain Inventory Average Pain 7 Pain Right Now 5 My pain is sharp, burning, dull, stabbing and aching  In the last 24 hours, has pain interfered with the following? General activity 10 Relation with others 7 Enjoyment of life 10 What TIME of day is your pain at its worst? morning , daytime, evening and night Sleep (in general) Fair  Pain is worse with: walking, bending, sitting, inactivity and standing Pain improves with: medication Relief from Meds: 5  Family History  Problem Relation Age of Onset  . Hypertension Mother   . Thyroid disease Neg Hx    Social History   Socioeconomic History  . Marital status: Married    Spouse name: Not on file  . Number of children: Not on file  . Years of education: Not on file  . Highest education level: Not on file  Occupational History  . Occupation: Doesn't work outside the home  Tobacco Use  . Smoking status: Current Every Day Smoker    Packs/day: 1.00    Years: 26.00    Pack years: 26.00    Types: Cigarettes    Last attempt to quit: 06/19/2019    Years since  quitting: 1.1  . Smokeless tobacco: Former Clinical biochemist  . Vaping Use: Every day  . Substances: Nicotine, Flavoring  Substance and Sexual Activity  . Alcohol use: Yes    Alcohol/week: 0.0 standard drinks    Comment: occasional  . Drug use: Not Currently    Types: Marijuana    Comment: occasional  . Sexual activity: Not Currently    Birth control/protection: Surgical  Other Topics Concern  . Not on file  Social History Narrative  . Not on file   Social Determinants of Health   Financial Resource Strain: Not on file  Food Insecurity: Not on file  Transportation Needs: Not on file  Physical Activity: Not on file  Stress: Not on file  Social Connections: Not on file   Past Surgical History:  Procedure Laterality Date  . ABDOMINAL EXPOSURE N/A 05/29/2017   Procedure: ABDOMINAL EXPOSURE;  Surgeon: Chuck Hint, MD;  Location: [facility] OR;  Service: Vascular;  Laterality: N/A;  . ABDOMINAL HYSTERECTOMY    . ANTERIOR LUMBAR FUSION N/A 05/29/2017   Procedure: Anterior Lumbar Interbody Fusion  - Lumbar five sacral one;  Surgeon: Tia Alert, MD;  Location: [facility] OR;  Service: Neurosurgery;  Laterality: N/A;  . BIOPSY  02/17/2020   Procedure: BIOPSY;  Surgeon: Lanelle Bal, DO;  [facility];  Service: Endoscopy;;  gastric  .  CARPAL TUNNEL RELEASE Bilateral   . CHOLECYSTECTOMY    . CHOLECYSTECTOMY N/A 12/29/2016   Procedure: LAPAROSCOPIC CHOLECYSTECTOMY;  Surgeon: Lucretia RoersBridges, Lindsay C, MD;  Location: [facility] ORS;  Service: General;  Laterality: N/A;  . COLONOSCOPY WITH PROPOFOL N/A 02/17/2020   non-bleeding internal hemorrhoids, otherwise normal.  . CYSTOSCOPY/URETEROSCOPY/HOLMIUM LASER/STENT PLACEMENT Right 06/25/2019   Procedure: CYSTOSCOPY WITH RIGHT RETROGRADE PYELOGRAM/RIGHT URETEROSCOPY/HOLMIUM LASER APPLICATION RIGHT URETERAL CALCULUS/RIGHT URETERAL STENT PLACEMENT;  Surgeon: Malen GauzeMcKenzie, Patrick L, MD;  Location: [facility];  Service: Urology;  Laterality: Right;  .  ESOPHAGOGASTRODUODENOSCOPY (EGD) WITH PROPOFOL N/A 02/17/2020   Procedure: ESOPHAGOGASTRODUODENOSCOPY (EGD) WITH PROPOFOL;  Surgeon: Lanelle Balarver, Charles K, DO;  [facility];  Service: Endoscopy;  Laterality: N/A;  . EXTRACORPOREAL SHOCK WAVE LITHOTRIPSY Right 04/10/2019   Procedure: EXTRACORPOREAL SHOCK WAVE LITHOTRIPSY (ESWL);  Surgeon: Noel ChristmasPace, Maryellen D, MD;  [facility];  Service: Urology;  Laterality: Right;  . LAMINECTOMY WITH POSTERIOR LATERAL ARTHRODESIS LEVEL 1 N/A 02/28/2018   Procedure: Laminectomy and Foraminotomy Lumbar Five-Sacral One, posterolateral fusion and fixation;  Surgeon: Tia AlertJones, David S, MD;  [facility];  Service: Neurosurgery;  Laterality: N/A;  posterior  . LUMBAR EPIDURAL INJECTION    . STERIOD INJECTION N/A 01/10/2017   Procedure: MINOR EXPAREL INJECTION;  Surgeon: Lucretia RoersBridges, Lindsay C, MD;  Location: [facility];  Service: General;  Laterality: N/A;  . TOTAL ABDOMINAL HYSTERECTOMY W/ BILATERAL SALPINGOOPHORECTOMY N/A   . TUBAL LIGATION  2003   Past Surgical History:  Procedure Laterality Date  . ABDOMINAL EXPOSURE N/A 05/29/2017   Procedure: ABDOMINAL EXPOSURE;  Surgeon: Chuck Hintickson, Christopher S, MD;  [facility];  Service: Vascular;  Laterality: N/A;  . ABDOMINAL HYSTERECTOMY    . ANTERIOR LUMBAR FUSION N/A 05/29/2017   Procedure: Anterior Lumbar Interbody Fusion  - Lumbar five sacral one;  Surgeon: Tia AlertJones, David S, MD;  [facility];  Service: Neurosurgery;  Laterality: N/A;  . BIOPSY  02/17/2020   Procedure: BIOPSY;  Surgeon: Lanelle Balarver, Charles K, DO;  [facility];  Service: Endoscopy;;  gastric  . CARPAL TUNNEL RELEASE Bilateral   . CHOLECYSTECTOMY    . CHOLECYSTECTOMY N/A 12/29/2016   Procedure: LAPAROSCOPIC CHOLECYSTECTOMY;  Surgeon: Lucretia RoersBridges, Lindsay C, MD;  Location: [facility];  Service: General;  Laterality: N/A;  . COLONOSCOPY WITH PROPOFOL N/A 02/17/2020   non-bleeding internal hemorrhoids, otherwise normal.  .  CYSTOSCOPY/URETEROSCOPY/HOLMIUM LASER/STENT PLACEMENT Right 06/25/2019   Procedure: CYSTOSCOPY WITH RIGHT RETROGRADE PYELOGRAM/RIGHT URETEROSCOPY/HOLMIUM LASER APPLICATION RIGHT URETERAL CALCULUS/RIGHT URETERAL STENT PLACEMENT;  Surgeon: Malen GauzeMcKenzie, Patrick L, MD;  Location: [facility];  Service: Urology;  Laterality: Right;  . ESOPHAGOGASTRODUODENOSCOPY (EGD) WITH PROPOFOL N/A 02/17/2020   Procedure: ESOPHAGOGASTRODUODENOSCOPY (EGD) WITH PROPOFOL;  Surgeon: Lanelle Balarver, Charles K, DO;  [facility];  Service: Endoscopy;  Laterality: N/A;  . EXTRACORPOREAL SHOCK WAVE LITHOTRIPSY Right 04/10/2019   Procedure: EXTRACORPOREAL SHOCK WAVE LITHOTRIPSY (ESWL);  Surgeon: Noel ChristmasPace, Maryellen D, MD;  [facility];  Service: Urology;  Laterality: Right;  . LAMINECTOMY WITH POSTERIOR LATERAL ARTHRODESIS LEVEL 1 N/A 02/28/2018   Procedure: Laminectomy and Foraminotomy Lumbar Five-Sacral One, posterolateral fusion and fixation;  Surgeon: Tia AlertJones, David S, MD;  [facility];  Service: Neurosurgery;  Laterality: N/A;  posterior  . LUMBAR EPIDURAL INJECTION    . STERIOD INJECTION N/A 01/10/2017   Procedure: MINOR EXPAREL INJECTION;  Surgeon: Lucretia RoersBridges, Lindsay C, MD;  Location: [facility];  Service: General;  Laterality: N/A;  . TOTAL ABDOMINAL HYSTERECTOMY W/ BILATERAL SALPINGOOPHORECTOMY N/A   .  TUBAL LIGATION  2003   Past Medical History:  Diagnosis Date  . Abnormal Pap smear of cervix   . ANXIETY 08/30/2007  . Asthma   . Chronic back pain   . Claustrophobia    pt requests Versed before surgery.  . Complication of anesthesia    hx of waking up during anesthesia  . GERD (gastroesophageal reflux disease)   . History of kidney stones   . Hypertension   . 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 INSOMNIA 08/30/2007  . MIGRAINE HEADACHE 08/30/2007  . Neck pain   . Palpitations    occasionally couple times a week  . Sciatica   . Sleep apnea    cpap  . Stroke [facility]) 2010   states, no  deficits and on no meds now.   BP 109/74   Pulse 76   Temp 98.5 F (36.9 C)   Ht 5\' 7"  (1.702 m)   Wt 244 lb 6.4 oz (110.9 kg)   LMP 02/25/2016   SpO2 97%   BMI 38.28 kg/m   Opioid Risk Score:   Fall Risk Score:  `1  Depression screen PHQ 2/9  Depression screen [facility] 2/9 07/21/2020 06/23/2020 05/27/2020 04/26/2020 12/04/2017 10/11/2016  Decreased Interest 0 3 0 0 0 0  Down, Depressed, Hopeless 0 0 0 1 0 0  PHQ - 2 Score 0 3 0 1 0 0  Altered sleeping - 3 3 - - -  Tired, decreased energy - 1 1 - - -  Change in appetite - 2 3 - - -  Feeling bad or failure about yourself  - 0 0 - - -  Trouble concentrating - 0 0 - - -  Moving slowly or fidgety/restless - 0 0 - - -  Suicidal thoughts - 0 0 - - -  PHQ-9 Score - 9 7 - - -  Difficult doing work/chores - Somewhat difficult - - - -  Some recent data might be hidden      Review of Systems  Constitutional: Negative.   HENT: Negative.   Eyes: Negative.   Respiratory: Negative.   Cardiovascular: Negative.   Gastrointestinal: Negative.   Endocrine: Negative.   Genitourinary: Negative.   Musculoskeletal: Positive for back pain.       LOWER BACK PIAN INTO THE GLUTEAL AREA , LEFT HIP PSIN , LEFT LEG PAIN , LEFT FOOT  Skin: Negative.   Allergic/Immunologic: Negative.   Neurological: Negative.   Hematological: Negative.   Psychiatric/Behavioral: Negative.        Objective:   Physical Exam Vitals and nursing note reviewed.  Constitutional:      Appearance: Normal appearance.  Cardiovascular:     Rate and Rhythm: Normal rate and regular rhythm.     Pulses: Normal pulses.     Heart sounds: Normal heart sounds.  Pulmonary:     Effort: Pulmonary effort is normal.     Breath sounds: Normal breath sounds.  Musculoskeletal:     Cervical back: Normal range of motion and neck supple.     Comments: Normal Muscle Bulk and Muscle Testing Reveals:  Upper Extremities: Full ROM and Muscle Strength 5/5 Lumbar Paraspinal Tenderness:  L-4-L-5 Lower Extremities: Full ROM and Muscle Strength 5/5 Left Lower Extremity Flexion Produces Pain into her Left Heel Arises from Table Slowly  Antalgic  Gait   Skin:    General: Skin is warm and dry.  Neurological:     Mental Status: She is alert and oriented to person, place, and time.  Psychiatric:        Mood and Affect: Mood normal.        Behavior: Behavior normal.           Assessment & Plan:  1. S/P Lumbar Spinal Fusion: S/P Laminectomy and Foraminotomy Lumbar Five-Sacral One, posterolateral fusion and fixation by Dr Yetta Barre on 02/28/2018. Continue HEP as Tolerated. Continue to Monitor. 08/23/2020 2. Left Lumbar Radiculitis.  Continue current medication regimen. Continue to Monitor. 08/23/2020 3. Chronic Pain Syndrome: Refilled Oxycodone 5 mg/325 mg daily as needed for pain #30. Second script sent to accommodate scheduled appointment with Dr Carlis Abbott.We will continue the opioid monitoring program, this consists of regular clinic visits, examinations, urine drug screen, pill counts as well as use of [place] Controlled Substance Reporting system. A 12 month History has been reviewed on the [place] Controlled Substance Reporting System on 08/23/2020 4. Left Foot Pain: She has an appointment with Podiatry today.  Continue to monitor.   F/U in 1 month

## 2020-09-10 ENCOUNTER — Telehealth: Payer: Self-pay | Admitting: *Deleted

## 2020-09-10 ENCOUNTER — Other Ambulatory Visit: Payer: Self-pay

## 2020-09-10 ENCOUNTER — Ambulatory Visit (INDEPENDENT_AMBULATORY_CARE_PROVIDER_SITE_OTHER): Payer: 59 | Admitting: Family Medicine

## 2020-09-10 DIAGNOSIS — F411 Generalized anxiety disorder: Secondary | ICD-10-CM | POA: Diagnosis not present

## 2020-09-10 MED ORDER — CLONAZEPAM 0.5 MG PO TABS: ORAL_TABLET | ORAL | 1 refills | Status: DC

## 2020-09-10 MED ORDER — LISINOPRIL 20 MG PO TABS
20.0000 mg | ORAL_TABLET | Freq: Every day | ORAL | 1 refills | Status: DC
Start: 2020-09-10 — End: 2021-11-28

## 2020-09-10 NOTE — Telephone Encounter (Signed)
[name], Teresa Vincent are scheduled for a virtual visit with your provider today.    Just as we do with appointments in the office, we must obtain your consent to participate.  Your consent will be active for this visit and any virtual visit you may have with one of our providers in the next 365 days.    If you have a MyChart account, I can also send a copy of this consent to you electronically.  All virtual visits are billed to your insurance company just like a traditional visit in the office.  As this is a virtual visit, video technology does not allow for your provider to perform a traditional examination.  This may limit your provider's ability to fully assess your condition.  If your provider identifies any concerns that need to be evaluated in person or the need to arrange testing such as labs, EKG, etc, we will make arrangements to do so.    Although advances in technology are sophisticated, we cannot ensure that it will always work on either your end or our end.  If the connection with a video visit is poor, we may have to switch to a telephone visit.  With either a video or telephone visit, we are not always able to ensure that we have a secure connection.   I need to obtain your verbal consent now.   Are you willing to proceed with your visit today?   Teresa Vincent has provided verbal consent on 09/10/2020 for a virtual visit (video or telephone).   Kathleen Lime, RN 09/10/2020  1:36 PM

## 2020-09-10 NOTE — Progress Notes (Signed)
Patient ID: Teresa Vincent, female    DOB: Jul 26, 1980, 40 y.o.   MRN: [id number]   Virtual Visit via Telephone Note  I connected with Kynzli Rease Bogle on 09/10/20 at  3:30 PM EDT by telephone and verified that I am speaking with the correct person using two identifiers.  Location: Patient: home Provider: office    I discussed the limitations, risks, security and privacy concerns of performing an evaluation and management service by telephone and the availability of in person appointments. I also discussed with the patient that there may be a patient responsible charge related to this service. The patient expressed understanding and agreed to proceed.  Chief Complaint  Patient presents with   Anxiety    Follow up   Subjective:    HPI F/u anxiety and depression.  Pt stopped taking the Effexor felt was making her worse with anxiety.  Taking klonapin is helping more for sleep.   Not having worse problems with depression or anxiety.  Has some anxiety at night.  Doing well during the day.  Taking 1/4 tab or less p.o. at night of clonazepam.  Taking the medication about 9pm at night and asleep about 10pm. Getting sleep till about 5pm.  Getting oxycodone from pain medication.  Bp- 109/74, last office.  Seeing cardiology for heart doctor and getting metoprolol from them.   Medical History Mickala has a past medical history of Abnormal Pap smear of cervix, ANXIETY (08/30/2007), Asthma, Chronic back pain, Claustrophobia, Complication of anesthesia, GERD (gastroesophageal reflux disease), History of kidney stones, Hypertension, HYPOTHYROIDISM, POST-RADIATION (02/08/2009), INSOMNIA (08/30/2007), MIGRAINE HEADACHE (08/30/2007), Neck pain, Palpitations, Sciatica, Sleep apnea, and Stroke (HCC) (2010).   Outpatient Encounter Medications as of 09/10/2020  Medication Sig   atorvastatin (LIPITOR) 20 MG tablet Take 1 tablet by mouth once daily   Cholecalciferol (D3) 50 MCG (2000 UT)  TABS Take by mouth daily.   cholestyramine (QUESTRAN) 4 GM/DOSE powder One scoop BID prn diarrhea (Patient taking differently: Take 4 g by mouth 2 (two) times daily as needed (diarrhea).)   clonazePAM (KLONOPIN) 0.5 MG tablet Take 1/2 tab p.o. qhs for insomnia.   cyclobenzaprine (FLEXERIL) 10 MG tablet Take 1 tablet (10 mg total) by mouth 3 (three) times daily as needed. for muscle spams (Patient not taking: Reported on 08/23/2020)   Evening Primrose Oil 1000 MG CAPS Take 1,000 mg by mouth 2 (two) times a week. (Patient not taking: Reported on 09/10/2020)   levothyroxine (SYNTHROID) 150 MCG tablet TAKE 1 TABLET BY MOUTH ONCE DAILY BEFORE BREAKFAST   lisinopril (ZESTRIL) 20 MG tablet Take 1 tablet (20 mg total) by mouth daily.   metoprolol succinate (TOPROL-XL) 50 MG 24 hr tablet Take 1 tablet by mouth daily.   omeprazole (PRILOSEC) 40 MG capsule TAKE 1 CAPSULE BY MOUTH TWICE DAILY BEFORE A MEAL   ondansetron (ZOFRAN-ODT) 4 MG disintegrating tablet Take one tablet every 8 hours prn (Patient taking differently: Take 4 mg by mouth every 8 (eight) hours as needed for nausea or vomiting.)   oxyCODONE-acetaminophen (PERCOCET) 5-325 MG tablet Take 1 tablet by mouth daily as needed for severe pain.   [DISCONTINUED] clonazePAM (KLONOPIN) 0.5 MG tablet Take 1/2 tab p.o. qhs for insomnia.   [DISCONTINUED] lisinopril (ZESTRIL) 20 MG tablet Take 1 tablet (20 mg total) by mouth daily.   No facility-administered encounter medications on file as of 09/10/2020.     Review of Systems  Constitutional:  Negative for chills and fever.  HENT:  Negative for congestion,  rhinorrhea and sore throat.   Respiratory:  Negative for cough, shortness of breath and wheezing.   Cardiovascular:  Negative for chest pain and leg swelling.  Gastrointestinal:  Negative for abdominal pain, diarrhea, nausea and vomiting.  Genitourinary:  Negative for dysuria and frequency.  Musculoskeletal:  Negative for arthralgias and back pain.   Skin:  Negative for rash.  Neurological:  Negative for dizziness, weakness and headaches.    Vitals LMP 02/25/2016   Objective:   Physical Exam  No PE due to phone visit.  Assessment and Plan   1. Anxiety state - clonazePAM (KLONOPIN) 0.5 MG tablet; Take 1/2 tab p.o. qhs for insomnia.  Dispense: 30 tablet; Refill: 1   Anxiety- stable.  Tried celexa in  past, didn't tolerate.  Will cont with clonazepam. Pmp reviewed- last oxycodone 08/25/20-  30 tab. Last fill 07/06/20- clonazepam 0.5mg   Reviewed with pt my departure from the office, but pt needing to find another pcp, and to start calling now to get into office with someone.  Pt in agreement.  Return in about 3 months (around 12/11/2020) for f/u anxiety.    Follow Up Instructions:    I discussed the assessment and treatment plan with the patient. The patient was provided an opportunity to ask questions and all were answered. The patient agreed with the plan and demonstrated an understanding of the instructions.   The patient was advised to call back or seek an in-person evaluation if the symptoms worsen or if the condition fails to improve as anticipated.  I provided 15 minutes of non-face-to-face time during this encounter.

## 2020-09-30 ENCOUNTER — Ambulatory Visit: Payer: 59 | Admitting: Physical Medicine and Rehabilitation

## 2020-10-11 ENCOUNTER — Encounter: Payer: 59 | Admitting: Physical Medicine and Rehabilitation

## 2020-10-12 ENCOUNTER — Encounter: Payer: 59 | Attending: Registered Nurse | Admitting: Physical Medicine and Rehabilitation

## 2020-10-12 ENCOUNTER — Other Ambulatory Visit: Payer: Self-pay

## 2020-10-12 VITALS — BP 104/71 | HR 80 | Temp 98.2°F | Ht 67.0 in | Wt 246.2 lb

## 2020-10-12 DIAGNOSIS — G894 Chronic pain syndrome: Secondary | ICD-10-CM

## 2020-10-12 DIAGNOSIS — Z79891 Long term (current) use of opiate analgesic: Secondary | ICD-10-CM | POA: Diagnosis present

## 2020-10-12 DIAGNOSIS — Z5181 Encounter for therapeutic drug level monitoring: Secondary | ICD-10-CM | POA: Insufficient documentation

## 2020-10-12 MED ORDER — OXYCODONE-ACETAMINOPHEN 5-325 MG PO TABS: 1.0000 | ORAL_TABLET | Freq: Every day | ORAL | 0 refills | Status: DC | PRN

## 2020-10-12 NOTE — Progress Notes (Signed)
Subjective:    Patient ID: Teresa Vincent, female    DOB: 03/22/1980, 40 y.o.   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 Teresa Vincent is a 40 year old woman who presents to establish care for left sided lower back pain that radiates into her left leg.  -Pain has been present for years -She has had two back surgeries.  -She has had 7 injections but got no relief from these.  -She has gone back to Dr. Yetta Barre who did her surgery.  -She was on oxycodone 5mg  that she broke in half to 2.5s which helped. This helped her to get through the day. She has been on it for years. She has been following with .  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   2) left knee pain: -has had steroid injection with benefit.  -this has continued to benefit.    Pain Inventory Average Pain 8 Pain Right Now 5 My pain is sharp, burning, dull, stabbing, tingling and aching  In the last 24 hours, has pain interfered with the following? General activity 5 Relation with others 5 Enjoyment of life 8 What TIME of day is your pain at its worst? morning , daytime, evening and night Sleep (in general) Poor  Pain is worse with: walking, bending, sitting, inactivity, standing and some activites Pain improves with: medication Relief from Meds: 9     Family History  Problem Relation Age of Onset   Hypertension Mother    Thyroid disease Neg Hx    Social History   Socioeconomic History   Marital status: Married    Spouse name: Not on file   Number of children: Not on file   Years of education: Not on file   Highest education level: Not on file  Occupational History   Occupation: Doesn't work outside the home  Tobacco Use   Smoking status: Every Day    Packs/day: 1.00    Years: 26.00    Pack years: 26.00    Types: Cigarettes    Last attempt to quit:  06/19/2019    Years since quitting: 1.3   Smokeless tobacco: Former  08/19/2019 Use: Every day   Substances: Nicotine, Flavoring  Substance and Sexual Activity   Alcohol use: Yes    Alcohol/week: 0.0 standard drinks    Comment: occasional   Drug use: Not Currently    Types: Marijuana    Comment: occasional   Sexual activity: Not Currently    Birth control/protection: Surgical  Other Topics Concern   Not on file  Social History Narrative   Not on file   Social Determinants of Health   Financial Resource Strain: Not on file  Food Insecurity: Not on file  Transportation Needs: Not on file  Physical Activity: Not on file  Stress: Not on file  Social Connections: Not on file   Past Surgical History:  Procedure Laterality Date   ABDOMINAL EXPOSURE N/A 05/29/2017   Procedure: ABDOMINAL EXPOSURE;  Surgeon: 07/29/2017, MD;  Location: [facility];  Service: Vascular;  Laterality: N/A;   ABDOMINAL HYSTERECTOMY     ANTERIOR LUMBAR FUSION N/A 05/29/2017   Procedure: Anterior Lumbar Interbody Fusion  - Lumbar five sacral one;  Surgeon: 07/29/2017, MD;  Location: [facility] OR;  Service: Neurosurgery;  Laterality: N/A;   BIOPSY  02/17/2020   Procedure: BIOPSY;  Surgeon: Lanelle Balarver, Charles K, DO;  [facility];  Service: Endoscopy;;  gastric   CARPAL TUNNEL RELEASE Bilateral    CHOLECYSTECTOMY     CHOLECYSTECTOMY N/A 12/29/2016   Procedure: LAPAROSCOPIC CHOLECYSTECTOMY;  Surgeon: Lucretia RoersBridges, Lindsay C, MD;  Location: [facility];  Service: General;  Laterality: N/A;   COLONOSCOPY WITH PROPOFOL N/A 02/17/2020   non-bleeding internal hemorrhoids, otherwise normal.   CYSTOSCOPY/URETEROSCOPY/HOLMIUM LASER/STENT PLACEMENT Right 06/25/2019   Procedure: CYSTOSCOPY WITH RIGHT RETROGRADE PYELOGRAM/RIGHT URETEROSCOPY/HOLMIUM LASER APPLICATION RIGHT URETERAL CALCULUS/RIGHT URETERAL STENT PLACEMENT;  Surgeon: Malen GauzeMcKenzie, Patrick L, MD;  Location: [facility];  Service: Urology;  Laterality: Right;    ESOPHAGOGASTRODUODENOSCOPY (EGD) WITH PROPOFOL N/A 02/17/2020   Procedure: ESOPHAGOGASTRODUODENOSCOPY (EGD) WITH PROPOFOL;  Surgeon: Lanelle Balarver, Charles K, DO;  [facility];  Service: Endoscopy;  Laterality: N/A;   EXTRACORPOREAL SHOCK WAVE LITHOTRIPSY Right 04/10/2019   Procedure: EXTRACORPOREAL SHOCK WAVE LITHOTRIPSY (ESWL);  Surgeon: Noel ChristmasPace, Maryellen D, MD;  [facility];  Service: Urology;  Laterality: Right;   LAMINECTOMY WITH POSTERIOR LATERAL ARTHRODESIS LEVEL 1 N/A 02/28/2018   Procedure: Laminectomy and Foraminotomy Lumbar Five-Sacral One, posterolateral fusion and fixation;  Surgeon: Tia AlertJones, David S, MD;  [facility];  Service: Neurosurgery;  Laterality: N/A;  posterior   LUMBAR EPIDURAL INJECTION     STERIOD INJECTION N/A 01/10/2017   Procedure: MINOR EXPAREL INJECTION;  Surgeon: Lucretia RoersBridges, Lindsay C, MD;  Location: [facility];  Service: General;  Laterality: N/A;   TOTAL ABDOMINAL HYSTERECTOMY W/ BILATERAL SALPINGOOPHORECTOMY N/A    TUBAL LIGATION  2003   Past Medical History:  Diagnosis Date   Abnormal Pap smear of cervix    ANXIETY 08/30/2007   Asthma    Chronic back pain    Claustrophobia    pt requests Versed before surgery.   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   BP 104/71   Pulse 80   Temp 98.2 F (36.8 C)   Ht 5\' 7"  (1.702 m)   Wt 246 lb 3.2 oz (111.7 kg)   LMP 02/25/2016   SpO2 96%   BMI 38.56 kg/m   Opioid Risk Score:   Fall Risk Score:  `1  Depression screen PHQ 2/9  [facility] 09/10/2020 07/21/2020 06/23/2020 05/27/2020 04/26/2020 12/04/2017  10/11/2016  Decreased Interest 0 0 3 0 0 0 0  Down, Depressed, Hopeless 0 0 0 0 1 0 0  PHQ - 2 Score 0 0 3 0 1 0 0  Altered sleeping 3 - 3 3 - - -  Tired, decreased energy 1 - 1 1 - - -  Change in appetite 0 - 2 3 - - -  Feeling bad or failure about yourself  0 - 0 0 - - -  Trouble concentrating 0 - 0 0 - - -  Moving slowly or fidgety/restless 0 - 0 0 - - -  Suicidal thoughts 0 - 0 0 - - -  PHQ-9 Score 4 - 9 7 - - -  Difficult doing work/chores Not difficult at all - Somewhat difficult - - - -  Some recent data might be hidden    Review of Systems  Constitutional:  Positive for unexpected weight change.  HENT: Negative.    Eyes: Negative.   Respiratory: Negative.    Cardiovascular:  Positive for leg swelling.  Genitourinary: Negative.   Musculoskeletal:  Positive for arthralgias and back pain.  Skin: Negative.   Allergic/Immunologic: Negative.   Neurological:  Positive for numbness.       Tingling  Psychiatric/Behavioral:  The patient is nervous/anxious.   All other systems reviewed and are negative.     Objective:   Physical Exam  Gen: no distress, normal appearing HEENT: oral mucosa pink and moist, NCAT Cardio: Reg rate Chest: normal effort, normal rate of breathing Abd: soft, non-distended Ext: no edema Psych: pleasant, normal affect Skin: intact Neuro: Alert and oriented x3 Musculoskeletal: Tenderness to palpation in bilateral lower back. Slump test reproduces pain in lower back. 5/5 strength throughout. Pain is present with both flexion and extension.    Assessment & Plan:  Mrs. Collyer is a 40 year old woman who presents to establish care for lumbar spinal stenosis.  1) Lumbar spinal stenosis with lumbar neurogenic claudication.  -Discussed benefits of exercise in reducing pain. -She is allergic to bupivicaine, she has tried surgery, injections, medication. Oxycodone 5mg  helped. She has never failed a drug screen. She works at . She takes one oxycodone  per day.  -Will obtain UDS and pain contract today. If results negative, can prescribe Percocet 5mg -325 monthly PRN.  Lumbar MRI reviewed as follows:  IMPRESSION: 1. No impingement identified in the lumbar spine. 2. 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 3. Degenerative disc disease at T11-12, L2- 3, L3-4, and L5-S1. 4. Cholelithiasis.  -Discussed following foods that may reduce pain: 1) Ginger (especially studied for arthritis)- reduce leukotriene production to decrease inflammation 2) Blueberries- high in phytonutrients that decrease inflammation 3) Salmon- marine omega-3s reduce joint swelling and pain 4) Pumpkin seeds- reduce inflammation 5) dark chocolate- reduces inflammation 6) turmeric- reduces inflammation 7) tart cherries - reduce pain and stiffness 8) extra virgin olive oil - its compound olecanthal helps to block prostaglandins  9) chili peppers- can be eaten or applied topically via capsaicin 10) mint- helpful for headache, muscle aches, joint pain, and itching 11) garlic- reduces inflammation  Link to further information on diet for chronic pain: [employer]    2) Obesity: weight 246 lbs, BMI 38.37.  -Educated regarding health benefits of weight loss- for pain, general health, chronic disease prevention, immune health, mental health.  -Will monitor weight every visit.  Recommended Roobois tea daily.  -Discussed foods that can assist in weight loss: 1) leafy greens- high in fiber and nutrients 2) dark chocolate- improves metabolism (if prefer sweetened, best to sweeten with honey instead of sugar).  3) cruciferous vegetables- high in fiber and protein 4) full fat yogurt: high in healthy fat, protein, calcium, and probiotics 5) apples- high in a variety of phytochemicals 6) nuts- high in fiber and protein that increase feelings of fullness 7) grapefruit:  rich in nutrients, antioxidants, and fiber (not to be taken with anticoagulation) 8) beans- high in protein and fiber 9) salmon- has high quality protein and healthy fats 10) green tea- rich in polyphenols 11) eggs- rich in choline and vitamin D 12) tuna- high protein, boosts metabolism 13) avocado- decreases visceral abdominal fat 14) chicken (pasture raised): high in  protein and iron 15) blueberries- reduce abdominal fat and cholesterol 16) whole grains- decreases calories retained during digestion, speeds metabolism 17) chia seeds- curb appetite 18) chilies- increases fat metabolism

## 2020-10-12 NOTE — Patient Instructions (Addendum)
-  Discussed following foods that may reduce pain: 1) Ginger (especially studied for arthritis)- reduce leukotriene production to decrease inflammation 2) Blueberries- high in phytonutrients that decrease inflammation 3) Salmon- marine omega-3s reduce joint swelling and pain 4) Pumpkin seeds- reduce inflammation 5) dark chocolate- reduces inflammation 6) turmeric- reduces inflammation 7) tart cherries - reduce pain and stiffness 8) extra virgin olive oil - its compound olecanthal helps to block prostaglandins  9) chili peppers- can be eaten or applied topically via capsaicin 10) mint- helpful for headache, muscle aches, joint pain, and itching 11) garlic- reduces inflammation  Link to further information on diet for chronic pain: [email or web address]    Roobois tea- curbs appetite  -Discussed foods that can assist in weight loss:  1) Eggs  2) Leafy greens  3) Salmon  4) Cruciferous vegetables  5) Lean beef and chicken breast  6) Boiled potatoes  7) Tuna  8) Beans and legumes  9) Soups  10) Cottage cheese  11) Avocados  12) Apple cider vinegar  13) Nuts  14) Whole grains  15) Chili pepper  16) Fruit- berries are some of the best  17) Grapefruit  18) Chia seeds  19) Coconut oil  20) Full-fat yogurt

## 2020-10-14 LAB — TOXASSURE SELECT,+ANTIDEPR,UR

## 2020-10-19 ENCOUNTER — Telehealth: Payer: Self-pay | Admitting: *Deleted

## 2020-10-19 NOTE — Telephone Encounter (Signed)
Urine drug screen for this encounter is consistent for prescribed medication 

## 2020-10-21 ENCOUNTER — Other Ambulatory Visit: Payer: Self-pay | Admitting: "Endocrinology

## 2020-10-21 DIAGNOSIS — E890 Postprocedural hypothyroidism: Secondary | ICD-10-CM

## 2020-10-23 LAB — T4, FREE: Free T4: 1.1 ng/dL (ref 0.8–1.8)

## 2020-10-23 LAB — TSH: TSH: 15.06 mIU/L — ABNORMAL HIGH

## 2020-10-27 ENCOUNTER — Other Ambulatory Visit: Payer: Self-pay

## 2020-10-27 ENCOUNTER — Encounter: Payer: Self-pay | Admitting: "Endocrinology

## 2020-10-27 ENCOUNTER — Ambulatory Visit (INDEPENDENT_AMBULATORY_CARE_PROVIDER_SITE_OTHER): Payer: 59 | Admitting: "Endocrinology

## 2020-10-27 VITALS — BP 120/82 | HR 80 | Ht 67.0 in | Wt 249.2 lb

## 2020-10-27 DIAGNOSIS — E890 Postprocedural hypothyroidism: Secondary | ICD-10-CM

## 2020-10-27 DIAGNOSIS — R7303 Prediabetes: Secondary | ICD-10-CM

## 2020-10-27 LAB — POCT GLYCOSYLATED HEMOGLOBIN (HGB A1C): HbA1c, POC (controlled diabetic range): 6 % (ref 0.0–7.0)

## 2020-10-27 MED ORDER — METFORMIN HCL ER 500 MG PO TB24: 500.0000 mg | ORAL_TABLET | Freq: Every day | ORAL | 1 refills | Status: DC

## 2020-10-27 MED ORDER — LEVOTHYROXINE SODIUM 175 MCG PO TABS: ORAL_TABLET | ORAL | 1 refills | Status: DC

## 2020-10-27 NOTE — Progress Notes (Signed)
10/27/2020    Endocrinology follow-up note   Subjective:    Patient ID: Teresa Vincent, female    DOB: 07-21-1980, PCP Teresa Albert, MD  Past Medical History:  Diagnosis Date   Abnormal Pap smear of cervix    ANXIETY 08/30/2007   Asthma    Chronic back pain    Claustrophobia    pt requests Versed before surgery.   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     Past Surgical History:  Procedure Laterality Date   ABDOMINAL EXPOSURE N/A 05/29/2017   Procedure: ABDOMINAL EXPOSURE;  Surgeon: Teresa Hint, MD;  Location: [facility] OR;  Service: Vascular;  Laterality: N/A;   ABDOMINAL HYSTERECTOMY     ANTERIOR LUMBAR FUSION N/A 05/29/2017   Procedure: Anterior Lumbar Interbody Fusion  - Lumbar five sacral one;  Surgeon: Teresa Alert, MD;  Location: [facility] OR;  Service: Neurosurgery;  Laterality: N/A;   BIOPSY  02/17/2020   Procedure: BIOPSY;  Surgeon: Teresa Bal, DO;  [facility];  Service: Endoscopy;;  gastric   CARPAL TUNNEL RELEASE Bilateral    CHOLECYSTECTOMY     CHOLECYSTECTOMY N/A 12/29/2016   Procedure: LAPAROSCOPIC CHOLECYSTECTOMY;  Surgeon: Teresa Roers, MD;  Location: [facility];  Service: General;  Laterality: N/A;   COLONOSCOPY WITH PROPOFOL N/A 02/17/2020   non-bleeding internal hemorrhoids, otherwise normal.   CYSTOSCOPY/URETEROSCOPY/HOLMIUM LASER/STENT PLACEMENT Right 06/25/2019   Procedure: CYSTOSCOPY WITH RIGHT RETROGRADE PYELOGRAM/RIGHT URETEROSCOPY/HOLMIUM LASER APPLICATION RIGHT URETERAL CALCULUS/RIGHT URETERAL STENT PLACEMENT;  Surgeon: Teresa Gauze, MD;   Location: [facility];  Service: Urology;  Laterality: Right;   ESOPHAGOGASTRODUODENOSCOPY (EGD) WITH PROPOFOL N/A 02/17/2020   Procedure: ESOPHAGOGASTRODUODENOSCOPY (EGD) WITH PROPOFOL;  Surgeon: Teresa Bal, DO;  [facility];  Service: Endoscopy;  Laterality: N/A;   EXTRACORPOREAL SHOCK WAVE LITHOTRIPSY Right 04/10/2019   Procedure: EXTRACORPOREAL SHOCK WAVE LITHOTRIPSY (ESWL);  Surgeon: Teresa Christmas, MD;  Location: [facility];  Service: Urology;  Laterality: Right;   LAMINECTOMY WITH POSTERIOR LATERAL ARTHRODESIS LEVEL 1 N/A 02/28/2018   Procedure: Laminectomy and Foraminotomy Lumbar Five-Sacral One, posterolateral fusion and fixation;  Surgeon: Teresa Alert, MD;  Location: [facility] OR;  Service: Neurosurgery;  Laterality: N/A;  posterior   LUMBAR EPIDURAL INJECTION     STERIOD INJECTION N/A 01/10/2017   Procedure: MINOR EXPAREL INJECTION;  Surgeon: Teresa Roers, MD;  Location: [facility];  Service: General;  Laterality: N/A;   TOTAL ABDOMINAL HYSTERECTOMY W/ BILATERAL SALPINGOOPHORECTOMY N/A    TUBAL LIGATION  2003   Social History   Social History   Socioeconomic History   Marital status: Married    Spouse name: Not on file   Number of children: Not on file   Years of education: Not on file   Highest education level: Not on file  Occupational History   Occupation: Doesn't work outside the home  Tobacco Use   Smoking status: Every Day    Packs/day: 1.00    Years: 26.00    Pack years: 26.00  Types: Cigarettes    Last attempt to quit: 06/19/2019    Years since quitting: 1.3   Smokeless tobacco: Former  Building services engineer Use: Every day   Substances: Nicotine, Flavoring  Substance and Sexual Activity   Alcohol use: Yes    Alcohol/week: 0.0 standard drinks    Comment: occasional   Drug use: Not Currently    Types: Marijuana    Comment: occasional   Sexual activity: Not Currently    Birth control/protection: Surgical  Other Topics Concern    Not on file  Social History Narrative   Not on file   Social Determinants of Health   Financial Resource Strain: Not on file  Food Insecurity: Not on file  Transportation Needs: Not on file  Physical Activity: Not on file  Stress: Not on file  Social Connections: Not on file  Intimate Partner Violence: Not on file     Current Outpatient Medications  Medication Instructions   atorvastatin (LIPITOR) 20 MG tablet Take 1 tablet by mouth once daily   Cholecalciferol (D3) 50 MCG (2000 UT) TABS Oral, Daily   cholestyramine (QUESTRAN) 4 GM/DOSE powder One scoop BID prn diarrhea   clonazePAM (KLONOPIN) 0.5 MG tablet Take 1/2 tab p.o. qhs for insomnia.   cyclobenzaprine (FLEXERIL) 10 mg, Oral, 3 times daily PRN, for muscle spams   Evening Primrose Oil 1,000 mg, 2 times weekly   levothyroxine (SYNTHROID) 175 MCG tablet TAKE 1 TABLET BY MOUTH ONCE DAILY BEFORE BREAKFAST   lisinopril (ZESTRIL) 20 mg, Oral, Daily   metFORMIN (GLUCOPHAGE XR) 500 mg, Oral, Daily with breakfast   metoprolol succinate (TOPROL-XL) 50 MG 24 hr tablet 1 tablet, Oral, Daily   omeprazole (PRILOSEC) 40 MG capsule TAKE 1 CAPSULE BY MOUTH TWICE DAILY BEFORE A MEAL   ondansetron (ZOFRAN-ODT) 4 MG disintegrating tablet Take one tablet every 8 hours prn   oxyCODONE-acetaminophen (PERCOCET) 5-325 MG tablet 1 tablet, Oral, Daily PRN      HPI  Teresa Vincent is a 40 year old female patient with medical history as above.  She is being seen in follow-up for hypothyroidism related to remote past thyroid ablation with I-131 for Graves' disease.     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 She was treated with various doses of levothyroxine over the years currently on levothyroxine over the years.  Over the last several visits, her dose was adjusted to current dose of 150 mcg p.o. daily before breakfast.  She reports compliance and consistency taking  her medication.  She has no new complaints today.    Her previsit thyroid function tests are consistent with under replacement.     -She also has hyperlipidemia on atorvastatin , prediabetes with A1c of 5.6%, and hypertension on lisinopril.  -She denies any family history of thyroid dysfunction. She denies any personal history of goiter nor family history of thyroid cancer.  She also has prediabetes with A1c of 6%.   Objective:     HEENT: She has prominent eyes, however no significant lid lag.  Recent Results (from the past 2160 hour(s))  [facility] Plus     Status: None   Collection Time: 10/12/20  3:52 PM  Result Value Ref Range   Summary Note     Comment: ==================================================================== [facility] Select,+Antidepr,UR ==================================================================== Test  Result       Flag       Units  Drug Present   Oxycodone                      152                     ng/mg creat   Oxymorphone                    230                     ng/mg creat   Noroxycodone                   247                     ng/mg creat   Noroxymorphone                 68                      ng/mg creat    Sources of oxycodone are scheduled prescription medications.    Oxymorphone, noroxycodone, and noroxymorphone are expected    metabolites of oxycodone. Oxymorphone is also available as a    scheduled prescription medication.  ==================================================================== Test                      Result    Flag   Units      Ref Range   Creatinine              293              mg/dL      >=16>=20 ======================================== ============================ Declared Medications:  Medication list was not provided. ==================================================================== For clinical consultation, please call [phone])  [phone]. ====================================================================   TSH     Status: Abnormal   Collection Time: 10/22/20  7:47 AM  Result Value Ref Range   TSH 15.06 (H) mIU/L    Comment:           Reference Range .           > or = 20 Years  0.40-4.50 .                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  I discussed and increased her levothyroxine to 175 mcg p.o. daily before breakfast.   - We discussed about the correct intake of her thyroid hormone, on empty stomach  at fasting, with water, separated by at least 30 minutes from breakfast and other medications,  and separated by more than 4 hours from calcium, iron, multivitamins, acid reflux medications (PPIs). -Patient is made aware of the fact that thyroid hormone replacement is needed for life, dose to be adjusted by periodic monitoring of thyroid function tests.   2. Hyperlipidemia-  -She has responded significantly for atorvastatin treatment, with improving LDL.  She is advised to continue atorvastatin 20 mg p.o. daily at bedtime.    3) prediabetes-A1c of 6%.  She has been hesitant to go on metformin.  This time she is accepting metformin 500 mg p.o. daily at breakfast.   Side effects and precautions discussed with her.  4) Graves' orbitopathy: She would benefit from evaluation by ophthalmology, will be referred to [facility] for better assessment.   - I advised patient to maintain close follow up with Teresa Albert, MD for primary care needs.  -She will return in 6 months with previsit labs.   I spent 21 minutes in the care of the patient today including review of labs from Thyroid Function, CMP, and other relevant labs ; imaging/biopsy records (current and previous including abstractions from other facilities); face-to-face time discussing  her lab results and symptoms, medications doses, her options of short and long term treatment based on the latest standards of care / guidelines;   and documenting the encounter.  Hazely Sealey Newmann  participated in the discussions, expressed understanding, and voiced agreement with the above plans.  All questions were answered to her satisfaction. she is encouraged to contact clinic should she have any questions or concerns prior to her return visit.   Marquis Lunch, MD Phone: [phone]  Fax: [phone]  -  This note was partially dictated with voice recognition software. Similar sounding words can be transcribed inadequately or may not  be corrected upon review.  06/11/2018, 2:32 PM

## 2020-10-27 NOTE — Patient Instructions (Signed)

## 2020-11-02 ENCOUNTER — Encounter: Payer: Self-pay | Admitting: Internal Medicine

## 2020-11-02 ENCOUNTER — Ambulatory Visit: Payer: 59 | Admitting: Gastroenterology

## 2020-11-08 ENCOUNTER — Encounter: Payer: Self-pay | Admitting: Registered Nurse

## 2020-11-08 ENCOUNTER — Other Ambulatory Visit: Payer: Self-pay

## 2020-11-08 ENCOUNTER — Encounter: Payer: 59 | Attending: Registered Nurse | Admitting: Registered Nurse

## 2020-11-08 VITALS — BP 131/86 | HR 84 | Temp 98.4°F | Ht 67.0 in | Wt 250.0 lb

## 2020-11-08 DIAGNOSIS — M79672 Pain in left foot: Secondary | ICD-10-CM | POA: Diagnosis present

## 2020-11-08 DIAGNOSIS — Z5181 Encounter for therapeutic drug level monitoring: Secondary | ICD-10-CM | POA: Insufficient documentation

## 2020-11-08 DIAGNOSIS — Z981 Arthrodesis status: Secondary | ICD-10-CM | POA: Insufficient documentation

## 2020-11-08 DIAGNOSIS — M5416 Radiculopathy, lumbar region: Secondary | ICD-10-CM | POA: Diagnosis present

## 2020-11-08 DIAGNOSIS — Z79891 Long term (current) use of opiate analgesic: Secondary | ICD-10-CM | POA: Diagnosis present

## 2020-11-08 DIAGNOSIS — G894 Chronic pain syndrome: Secondary | ICD-10-CM

## 2020-11-08 DIAGNOSIS — G8929 Other chronic pain: Secondary | ICD-10-CM

## 2020-11-08 MED ORDER — OXYCODONE-ACETAMINOPHEN 5-325 MG PO TABS: 1.0000 | ORAL_TABLET | Freq: Every day | ORAL | 0 refills | Status: DC | PRN

## 2020-11-08 NOTE — Progress Notes (Signed)
Subjective:    Patient ID: Teresa LanceCarolyn J Vincent, female    DOB: 01/06/1981, 40 y.o.   MRN: [id number]  JXB:[id number]:Teresa Vincent is a 40 y.o. female who returns for follow up appointment for chronic pain and medication refill. She states her pain is located in her lower back. She rates her pain 7. Her current exercise regime is walking and performing stretching exercises.  Teresa Vincent Morphine equivalent is 7.5 MME.   Last UDS was Performed on 10/12/2020, it was consistent.      Pain Inventory Average Pain 9 Pain Right Now 7 My pain is sharp, burning, dull, and aching  In the last 24 hours, has pain interfered with the following? General activity 5 Relation with others 5 Enjoyment of life 7 What TIME of day is your pain at its worst? morning , daytime, evening, and night Sleep (in general) Poor  Pain is worse with: walking, bending, sitting, inactivity, and standing Pain improves with: medication Relief from Meds: 5  Family History  Problem Relation Age of Onset   Hypertension Mother    Thyroid disease Neg Hx    Social History   Socioeconomic History   Marital status: Married    Spouse name: Not on file   Number of children: Not on file   Years of education: Not on file   Highest education level: Not on file  Occupational History   Occupation: Doesn't work outside the home  Tobacco Use   Smoking status: Every Day    Packs/day: 1.00    Years: 26.00    Pack years: 26.00    Types: Cigarettes    Last attempt to quit: 06/19/2019    Years since quitting: 1.3   Smokeless tobacco: Former  Building services engineerVaping Use   Vaping Use: Every day   Substances: Nicotine, Flavoring  Substance and Sexual Activity   Alcohol use: Yes    Alcohol/week: 0.0 standard drinks    Comment: occasional   Drug use: Not Currently    Types: Marijuana    Comment: occasional   Sexual activity: Not Currently    Birth control/protection: Surgical  Other Topics Concern   Not on file  Social History Narrative    Not on file   Social Determinants of Health   Financial Resource Strain: Not on file  Food Insecurity: Not on file  Transportation Needs: Not on file  Physical Activity: Not on file  Stress: Not on file  Social Connections: Not on file   Past Surgical History:  Procedure Laterality Date   ABDOMINAL EXPOSURE N/A 05/29/2017   Procedure: ABDOMINAL EXPOSURE;  Surgeon: Chuck Hintickson, Christopher S, MD;  [facility];  Service: Vascular;  Laterality: N/A;   ABDOMINAL HYSTERECTOMY     ANTERIOR LUMBAR FUSION N/A 05/29/2017   Procedure: Anterior Lumbar Interbody Fusion  - Lumbar five sacral one;  Surgeon: Tia AlertJones, David S, MD;  [facility];  Service: Neurosurgery;  Laterality: N/A;   BIOPSY  02/17/2020   Procedure: BIOPSY;  Surgeon: Lanelle Balarver, Charles K, DO;  [facility];  Service: Endoscopy;;  gastric   CARPAL TUNNEL RELEASE Bilateral    CHOLECYSTECTOMY     CHOLECYSTECTOMY N/A 12/29/2016   Procedure: LAPAROSCOPIC CHOLECYSTECTOMY;  Surgeon: Lucretia RoersBridges, Lindsay C, MD;  Location: [facility] ORS;  Service: General;  Laterality: N/A;   COLONOSCOPY WITH PROPOFOL N/A 02/17/2020   non-bleeding internal hemorrhoids, otherwise normal.   CYSTOSCOPY/URETEROSCOPY/HOLMIUM LASER/STENT PLACEMENT Right 06/25/2019   Procedure: CYSTOSCOPY WITH RIGHT RETROGRADE PYELOGRAM/RIGHT URETEROSCOPY/HOLMIUM LASER APPLICATION RIGHT URETERAL CALCULUS/RIGHT  URETERAL STENT PLACEMENT;  Surgeon: Malen Gauze, MD;  Location: [facility] ORS;  Service: Urology;  Laterality: Right;   ESOPHAGOGASTRODUODENOSCOPY (EGD) WITH PROPOFOL N/A 02/17/2020   Procedure: ESOPHAGOGASTRODUODENOSCOPY (EGD) WITH PROPOFOL;  Surgeon: Lanelle Bal, DO;  [facility];  Service: Endoscopy;  Laterality: N/A;   EXTRACORPOREAL SHOCK WAVE LITHOTRIPSY Right 04/10/2019   Procedure: EXTRACORPOREAL SHOCK WAVE LITHOTRIPSY (ESWL);  Surgeon: Noel Christmas, MD;  Location[facility];  Service: Urology;  Laterality: Right;   LAMINECTOMY  WITH POSTERIOR LATERAL ARTHRODESIS LEVEL 1 N/A 02/28/2018   Procedure: Laminectomy and Foraminotomy Lumbar Five-Sacral One, posterolateral fusion and fixation;  Surgeon: Tia Alert, MD;  Location: [facility] OR;  Service: Neurosurgery;  Laterality: N/A;  posterior   LUMBAR EPIDURAL INJECTION     STERIOD INJECTION N/A 01/10/2017   Procedure: MINOR EXPAREL INJECTION;  Surgeon: [name], MD;  Location: [facility];  Service: General;  Laterality: N/A;   TOTAL ABDOMINAL HYSTERECTOMY W/ BILATERAL SALPINGOOPHORECTOMY N/A    TUBAL LIGATION  2003   Past Surgical History:  Procedure Laterality Date   ABDOMINAL EXPOSURE N/A 05/29/2017   Procedure: ABDOMINAL EXPOSURE;  Surgeon: Chuck Hint, MD;  Location: [facility] OR;  Service: Vascular;  Laterality: N/A;   ABDOMINAL HYSTERECTOMY     ANTERIOR LUMBAR FUSION N/A 05/29/2017   Procedure: Anterior Lumbar Interbody Fusion  - Lumbar five sacral one;  Surgeon: Tia Alert, MD;  Location: [facility] OR;  Service: Neurosurgery;  Laterality: N/A;   BIOPSY  02/17/2020   Procedure: BIOPSY;  Surgeon: Lanelle Bal, DO;  [facility];  Service: Endoscopy;;  gastric   CARPAL TUNNEL RELEASE Bilateral    CHOLECYSTECTOMY     CHOLECYSTECTOMY N/A 12/29/2016   Procedure: LAPAROSCOPIC CHOLECYSTECTOMY;  Surgeon: [name], MD;  Location: [facility];  Service: General;  Laterality: N/A;   COLONOSCOPY WITH PROPOFOL N/A 02/17/2020   non-bleeding internal hemorrhoids, otherwise normal.   CYSTOSCOPY/URETEROSCOPY/HOLMIUM LASER/STENT PLACEMENT Right 06/25/2019   Procedure: CYSTOSCOPY WITH RIGHT RETROGRADE PYELOGRAM/RIGHT URETEROSCOPY/HOLMIUM LASER APPLICATION RIGHT URETERAL CALCULUS/RIGHT URETERAL STENT PLACEMENT;  Surgeon: Malen Gauze, MD;  Location: [facility];  Service: Urology;  Laterality: Right;   ESOPHAGOGASTRODUODENOSCOPY (EGD) WITH PROPOFOL N/A 02/17/2020   Procedure: ESOPHAGOGASTRODUODENOSCOPY (EGD) WITH PROPOFOL;  Surgeon: Lanelle Bal, DO;   [facility];  Service: Endoscopy;  Laterality: N/A;   EXTRACORPOREAL SHOCK WAVE LITHOTRIPSY Right 04/10/2019   Procedure: EXTRACORPOREAL SHOCK WAVE LITHOTRIPSY (ESWL);  Surgeon: Noel Christmas, MD;  Location: [facility];  Service: Urology;  Laterality: Right;   LAMINECTOMY WITH POSTERIOR LATERAL ARTHRODESIS LEVEL 1 N/A 02/28/2018   Procedure: Laminectomy and Foraminotomy Lumbar Five-Sacral One, posterolateral fusion and fixation;  Surgeon: Tia Alert, MD;  Location: [facility] OR;  Service: Neurosurgery;  Laterality: N/A;  posterior   LUMBAR EPIDURAL INJECTION     STERIOD INJECTION N/A 01/10/2017   Procedure: MINOR EXPAREL INJECTION;  Surgeon: [name], MD;  Location: [facility];  Service: General;  Laterality: N/A;   TOTAL ABDOMINAL HYSTERECTOMY W/ BILATERAL SALPINGOOPHORECTOMY N/A    TUBAL LIGATION  2003   Past Medical History:  Diagnosis Date   Abnormal Pap smear of cervix    ANXIETY 08/30/2007   Asthma    Chronic back pain    Claustrophobia    pt requests Versed before surgery.   Complication of anesthesia    hx of waking up during anesthesia   GERD (gastroesophageal reflux disease)    History of  kidney stones    Hypertension    HYPOTHYROIDISM, POST-RADIATION 02/08/2009   INSOMNIA 08/30/2007   MIGRAINE HEADACHE 08/30/2007   Neck pain    Palpitations    occasionally couple times a week   Sciatica    Sleep apnea    cpap   Stroke [facility]) 2010   states, no deficits and on no meds now.   BP 131/86   Pulse 84   Temp 98.4 F (36.9 C)   Ht 5\' 7"  (1.702 m)   Wt 250 lb (113.4 kg)   LMP 02/25/2016   SpO2 94%   BMI 39.16 kg/m   Opioid Risk Score:   Fall Risk Score:  `1  Depression screen PHQ 2/9  Depression screen [facility] 2/9 10/12/2020 09/10/2020 07/21/2020 06/23/2020 05/27/2020 04/26/2020 12/04/2017  Decreased Interest 0 0 0 3 0 0 0  Down, Depressed, Hopeless 0 0 0 0 0 1 0  PHQ - 2 Score 0 0 0 3 0 1 0  Altered sleeping - 3 - 3 3 - -  Tired, decreased  energy - 1 - 1 1 - -  Change in appetite - 0 - 2 3 - -  Feeling bad or failure about yourself  - 0 - 0 0 - -  Trouble concentrating - 0 - 0 0 - -  Moving slowly or fidgety/restless - 0 - 0 0 - -  Suicidal thoughts - 0 - 0 0 - -  PHQ-9 Score - 4 - 9 7 - -  Difficult doing work/chores - Not difficult at all - Somewhat difficult - - -  Some recent data might be hidden    Review of Systems  Constitutional: Negative.   HENT: Negative.    Eyes: Negative.   Respiratory: Negative.    Cardiovascular: Negative.   Gastrointestinal: Negative.   Endocrine: Negative.   Genitourinary: Negative.   Musculoskeletal:  Positive for back pain.  Skin: Negative.   Allergic/Immunologic: Negative.   Neurological: Negative.   Hematological: Negative.   Psychiatric/Behavioral: Negative.    All other systems reviewed and are negative.     Objective:   Physical Exam Vitals and nursing note reviewed.  Constitutional:      Appearance: Normal appearance.  Cardiovascular:     Rate and Rhythm: Normal rate and regular rhythm.     Pulses: Normal pulses.     Heart sounds: Normal heart sounds.  Pulmonary:     Effort: Pulmonary effort is normal.     Breath sounds: Normal breath sounds.  Musculoskeletal:     Cervical back: Normal range of motion and neck supple.     Comments: Normal Muscle Bulk and Muscle Testing Reveals:  Upper Extremities: Full ROM and Muscle Strength 5/5 Lower Extremities : Full ROM and Muscle Strength 5/5 Arises from Table with ease Narrow Based Gait     Skin:    General: Skin is warm and dry.  Neurological:     Mental Status: She is alert and oriented to person, place, and time.  Psychiatric:        Mood and Affect: Mood normal.        Behavior: Behavior normal.         Assessment & Plan:  1. S/P Lumbar Spinal Fusion: S/P Laminectomy and Foraminotomy Lumbar Five-Sacral One, posterolateral fusion and fixation by Dr 12/06/2017 on 02/28/2018. Continue HEP as Tolerated. Continue to  Monitor. 11/08/2020 2. Left Lumbar Radiculitis.  Continue current medication regimen. Continue to Monitor. 11/08/2020 3. Chronic Pain Syndrome: Refilled Oxycodone  5 mg/325 mg daily as needed for pain #30. Second script sent to accommodate scheduled appointment with Dr Carlis Abbott.We will continue the opioid monitoring program, this consists of regular clinic visits, examinations, urine drug screen, pill counts as well as use of [place] Controlled Substance Reporting system. A 12 month History has been reviewed on the [place] Controlled Substance Reporting System on 11/08/2020    F/U in 1 month

## 2020-11-08 NOTE — Patient Instructions (Signed)
Send a My Chart Message in September between 9/13-9/15th, regarding your medication.

## 2020-11-26 ENCOUNTER — Ambulatory Visit: Payer: 59 | Admitting: Family Medicine

## 2020-11-29 ENCOUNTER — Ambulatory Visit: Payer: 59 | Admitting: Internal Medicine

## 2020-11-30 ENCOUNTER — Telehealth: Payer: Self-pay | Admitting: Registered Nurse

## 2020-11-30 NOTE — Telephone Encounter (Signed)
Received a My-Chart messenge from Ms. Habermann with her pill count. She already has a prescription at the pharmacy to accommodate her scheduled appointment with Dr Carlis Abbott.

## 2020-12-09 ENCOUNTER — Telehealth: Payer: Self-pay | Admitting: Family Medicine

## 2020-12-09 MED ORDER — ONDANSETRON 4 MG PO TBDP: ORAL_TABLET | ORAL | 0 refills | Status: DC

## 2020-12-09 NOTE — Telephone Encounter (Signed)
Prescription sent electronically to pharmacy. Patient notified and scheduled office visit to establish care with Dr Adriana Simas 12/28/20.

## 2020-12-09 NOTE — Telephone Encounter (Signed)
Have refill of medication of Zofran  Please initiate process to help set her up with Dr. Adriana Simas thank you

## 2020-12-09 NOTE — Telephone Encounter (Signed)
Patient states she seen you in [facility]  and explain to you needing refill on ondansetron 4 mg  it was lasted filled 08/11/19 by Dr. Brett Canales and she was last seen 09/10/20. [facility]

## 2020-12-15 ENCOUNTER — Encounter: Payer: 59 | Admitting: Registered Nurse

## 2020-12-20 ENCOUNTER — Encounter: Payer: 59 | Attending: Registered Nurse | Admitting: Registered Nurse

## 2020-12-20 ENCOUNTER — Other Ambulatory Visit: Payer: Self-pay

## 2020-12-20 VITALS — BP 100/70 | HR 71 | Temp 98.0°F | Ht 67.0 in | Wt 247.0 lb

## 2020-12-20 DIAGNOSIS — Z5181 Encounter for therapeutic drug level monitoring: Secondary | ICD-10-CM | POA: Diagnosis present

## 2020-12-20 DIAGNOSIS — G8929 Other chronic pain: Secondary | ICD-10-CM | POA: Diagnosis present

## 2020-12-20 DIAGNOSIS — M5416 Radiculopathy, lumbar region: Secondary | ICD-10-CM

## 2020-12-20 DIAGNOSIS — F411 Generalized anxiety disorder: Secondary | ICD-10-CM | POA: Diagnosis present

## 2020-12-20 DIAGNOSIS — Z79891 Long term (current) use of opiate analgesic: Secondary | ICD-10-CM | POA: Diagnosis present

## 2020-12-20 DIAGNOSIS — G894 Chronic pain syndrome: Secondary | ICD-10-CM | POA: Diagnosis present

## 2020-12-20 DIAGNOSIS — M79672 Pain in left foot: Secondary | ICD-10-CM | POA: Diagnosis present

## 2020-12-20 DIAGNOSIS — Z981 Arthrodesis status: Secondary | ICD-10-CM | POA: Diagnosis not present

## 2020-12-20 NOTE — Patient Instructions (Signed)
Send a My Chart Message no later than 11/21 regarding medication.

## 2020-12-20 NOTE — Progress Notes (Signed)
Subjective:    Patient ID: Teresa Vincent, female    DOB: 03-27-1980, 40 y.o.   MRN: [id number]  HPI: Teresa Vincent is a 40 y.o. female who returns for follow up appointment for chronic pain and medication refill. She states her pain is located in her lower back radiating into left buttock and left foot. She rates her pain 6. Her current exercise regime is walking and performing stretching exercises.  Teresa Vincent Morphine equivalent is 7.50 MME.   Last UDS was Performed on 10/12/2020, it was consistent.    Pain Inventory Average Pain 8 Pain Right Now 6 My pain is sharp, burning, stabbing, and aching  In the last 24 hours, has pain interfered with the following? General activity 10 Relation with others 7 Enjoyment of life 5 What TIME of day is your pain at its worst? morning , daytime, evening, and night Sleep (in general) Poor  Pain is worse with: walking, bending, sitting, inactivity, standing, and some activites Pain improves with: medication Relief from Meds: 6  Family History  Problem Relation Age of Onset   Hypertension Mother    Thyroid disease Neg Hx    Social History   Socioeconomic History   Marital status: Married    Spouse name: Not on file   Number of children: Not on file   Years of education: Not on file   Highest education level: Not on file  Occupational History   Occupation: Doesn't work outside the home  Tobacco Use   Smoking status: Every Day    Packs/day: 1.00    Years: 26.00    Pack years: 26.00    Types: Cigarettes    Last attempt to quit: 06/19/2019    Years since quitting: 1.5   Smokeless tobacco: Former  Building services engineer Use: Every day   Substances: Nicotine, Flavoring  Substance and Sexual Activity   Alcohol use: Yes    Alcohol/week: 0.0 standard drinks    Comment: occasional   Drug use: Not Currently    Types: Marijuana    Comment: occasional   Sexual activity: Not Currently    Birth control/protection: Surgical   Other Topics Concern   Not on file  Social History Narrative   Not on file   Social Determinants of Health   Financial Resource Strain: Not on file  Food Insecurity: Not on file  Transportation Needs: Not on file  Physical Activity: Not on file  Stress: Not on file  Social Connections: Not on file   Past Surgical History:  Procedure Laterality Date   ABDOMINAL EXPOSURE N/A 05/29/2017   Procedure: ABDOMINAL EXPOSURE;  Surgeon: Chuck Hint, MD;  Location: [facility] OR;  Service: Vascular;  Laterality: N/A;   ABDOMINAL HYSTERECTOMY     ANTERIOR LUMBAR FUSION N/A 05/29/2017   Procedure: Anterior Lumbar Interbody Fusion  - Lumbar five sacral one;  Surgeon: Tia Alert, MD;  Location: [facility] OR;  Service: Neurosurgery;  Laterality: N/A;   BIOPSY  02/17/2020   Procedure: BIOPSY;  Surgeon: Lanelle Bal, DO;  [facility];  Service: Endoscopy;;  gastric   CARPAL TUNNEL RELEASE Bilateral    CHOLECYSTECTOMY     CHOLECYSTECTOMY N/A 12/29/2016   Procedure: LAPAROSCOPIC CHOLECYSTECTOMY;  Surgeon: [name], MD;  Location: [facility];  Service: General;  Laterality: N/A;   COLONOSCOPY WITH PROPOFOL N/A 02/17/2020   non-bleeding internal hemorrhoids, otherwise normal.   CYSTOSCOPY/URETEROSCOPY/HOLMIUM LASER/STENT PLACEMENT Right 06/25/2019   Procedure: CYSTOSCOPY WITH RIGHT  RETROGRADE PYELOGRAM/RIGHT URETEROSCOPY/HOLMIUM LASER APPLICATION RIGHT URETERAL CALCULUS/RIGHT URETERAL STENT PLACEMENT;  Surgeon: Malen Gauze, MD;  Location: [facility];  Service: Urology;  Laterality: Right;   ESOPHAGOGASTRODUODENOSCOPY (EGD) WITH PROPOFOL N/A 02/17/2020   Procedure: ESOPHAGOGASTRODUODENOSCOPY (EGD) WITH PROPOFOL;  Surgeon: Lanelle Bal, DO;  [facility];  Service: Endoscopy;  Laterality: N/A;   EXTRACORPOREAL SHOCK WAVE LITHOTRIPSY Right 04/10/2019   Procedure: EXTRACORPOREAL SHOCK WAVE LITHOTRIPSY (ESWL);  Surgeon: Noel Christmas, MD;  Location: [facility];  Service: Urology;  Laterality: Right;   LAMINECTOMY WITH POSTERIOR LATERAL ARTHRODESIS LEVEL 1 N/A 02/28/2018   Procedure: Laminectomy and Foraminotomy Lumbar Five-Sacral One, posterolateral fusion and fixation;  Surgeon: Tia Alert, MD;  Location: [facility] OR;  Service: Neurosurgery;  Laterality: N/A;  posterior   LUMBAR EPIDURAL INJECTION     STERIOD INJECTION N/A 01/10/2017   Procedure: MINOR EXPAREL INJECTION;  Surgeon: [name], MD;  Location: [facility];  Service: General;  Laterality: N/A;   TOTAL ABDOMINAL HYSTERECTOMY W/ BILATERAL SALPINGOOPHORECTOMY N/A    TUBAL LIGATION  2003   Past Surgical History:  Procedure Laterality Date   ABDOMINAL EXPOSURE N/A 05/29/2017   Procedure: ABDOMINAL EXPOSURE;  Surgeon: Chuck Hint, MD;  Location: [facility] OR;  Service: Vascular;  Laterality: N/A;   ABDOMINAL HYSTERECTOMY     ANTERIOR LUMBAR FUSION N/A 05/29/2017   Procedure: Anterior Lumbar Interbody Fusion  - Lumbar five sacral one;  Surgeon: Tia Alert, MD;  Location: [facility] OR;  Service: Neurosurgery;  Laterality: N/A;   BIOPSY  02/17/2020   Procedure: BIOPSY;  Surgeon: Lanelle Bal, DO;  [facility];  Service: Endoscopy;;  gastric   CARPAL TUNNEL RELEASE Bilateral    CHOLECYSTECTOMY     CHOLECYSTECTOMY N/A 12/29/2016   Procedure: LAPAROSCOPIC CHOLECYSTECTOMY;  Surgeon: [name], MD;  Location: [facility];  Service: General;  Laterality: N/A;   COLONOSCOPY WITH PROPOFOL N/A 02/17/2020   non-bleeding internal hemorrhoids, otherwise normal.   CYSTOSCOPY/URETEROSCOPY/HOLMIUM LASER/STENT PLACEMENT Right 06/25/2019   Procedure: CYSTOSCOPY WITH RIGHT RETROGRADE PYELOGRAM/RIGHT URETEROSCOPY/HOLMIUM LASER APPLICATION RIGHT URETERAL CALCULUS/RIGHT URETERAL STENT PLACEMENT;  Surgeon: Malen Gauze, MD;  Location: [facility];  Service: Urology;  Laterality: Right;   ESOPHAGOGASTRODUODENOSCOPY (EGD) WITH PROPOFOL N/A 02/17/2020   Procedure:  ESOPHAGOGASTRODUODENOSCOPY (EGD) WITH PROPOFOL;  Surgeon: Lanelle Bal, DO;  [facility];  Service: Endoscopy;  Laterality: N/A;   EXTRACORPOREAL SHOCK WAVE LITHOTRIPSY Right 04/10/2019   Procedure: EXTRACORPOREAL SHOCK WAVE LITHOTRIPSY (ESWL);  Surgeon: Noel Christmas, MD;  Location: [facility];  Service: Urology;  Laterality: Right;   LAMINECTOMY WITH POSTERIOR LATERAL ARTHRODESIS LEVEL 1 N/A 02/28/2018   Procedure: Laminectomy and Foraminotomy Lumbar Five-Sacral One, posterolateral fusion and fixation;  Surgeon: Tia Alert, MD;  Location: [facility] OR;  Service: Neurosurgery;  Laterality: N/A;  posterior   LUMBAR EPIDURAL INJECTION     STERIOD INJECTION N/A 01/10/2017   Procedure: MINOR EXPAREL INJECTION;  Surgeon: [name], MD;  Location: [facility];  Service: General;  Laterality: N/A;   TOTAL ABDOMINAL HYSTERECTOMY W/ BILATERAL SALPINGOOPHORECTOMY N/A    TUBAL LIGATION  2003   Past Medical History:  Diagnosis Date   Abnormal Pap smear of cervix    ANXIETY 08/30/2007   Asthma    Chronic back pain    Claustrophobia    pt requests Versed before surgery.   Complication of anesthesia    hx of waking up during anesthesia   GERD (  gastroesophageal reflux disease)    History of kidney stones    Hypertension    HYPOTHYROIDISM, POST-RADIATION 02/08/2009   INSOMNIA 08/30/2007   MIGRAINE HEADACHE 08/30/2007   Neck pain    Palpitations    occasionally couple times a week   Sciatica    Sleep apnea    cpap   Stroke [facility]) 2010   states, no deficits and on no meds now.   BP 100/70   Pulse 71   Temp 98 F (36.7 C) (Oral)   Ht 5\' 7"  (1.702 m)   Wt 247 lb (112 kg)   LMP 02/25/2016   SpO2 95%   BMI 38.69 kg/m   Opioid Risk Score:   Fall Risk Score:  `1  Depression screen PHQ 2/9  Depression screen [facility] 2/9 12/20/2020 10/12/2020 09/10/2020 07/21/2020 06/23/2020 05/27/2020 04/26/2020  Decreased Interest 0 0 0 0 3 0 0  Down, Depressed, Hopeless 0 0 0 0 0 0 1   PHQ - 2 Score 0 0 0 0 3 0 1  Altered sleeping - - 3 - 3 3 -  Tired, decreased energy - - 1 - 1 1 -  Change in appetite - - 0 - 2 3 -  Feeling bad or failure about yourself  - - 0 - 0 0 -  Trouble concentrating - - 0 - 0 0 -  Moving slowly or fidgety/restless - - 0 - 0 0 -  Suicidal thoughts - - 0 - 0 0 -  PHQ-9 Score - - 4 - 9 7 -  Difficult doing work/chores - - Not difficult at all - Somewhat difficult - -  Some recent data might be hidden      Review of Systems  Musculoskeletal:  Positive for back pain.       Left hamstring pain Left heel pain  All other systems reviewed and are negative.     Objective:   Physical Exam Vitals and nursing note reviewed.  Constitutional:      Appearance: Normal appearance.  Cardiovascular:     Rate and Rhythm: Normal rate and regular rhythm.     Pulses: Normal pulses.     Heart sounds: Normal heart sounds.  Pulmonary:     Effort: Pulmonary effort is normal.     Breath sounds: Normal breath sounds.  Musculoskeletal:     Cervical back: Normal range of motion and neck supple.     Comments: Normal Muscle Bulk and Muscle Testing Reveals:  Upper Extremities: Full ROM and Muscle Strength 5/5  Lumbar Paraspinal Tenderness: L-3-L-5 Lower Extremities: Full ROM and Muscle Strength 5/5 Right Lower Extremity Flexion Produces Pain into her Patella Arises from Table with ease Narrow Based  Gait     Skin:    General: Skin is warm and dry.  Neurological:     Mental Status: She is alert and oriented to person, place, and time.  Psychiatric:        Mood and Affect: Mood normal.        Behavior: Behavior normal.         Assessment & Plan:  1. S/P Lumbar Spinal Fusion: S/P Laminectomy and Foraminotomy Lumbar Five-Sacral One, posterolateral fusion and fixation by Dr 06/24/2020 on 02/28/2018. Continue HEP as Tolerated. Continue to Monitor. 12/20/2020 2. Left Lumbar Radiculitis.  Continue current medication regimen. Continue to Monitor. 12/20/2020 3.  Chronic Pain Syndrome: No script given. Continue Oxycodone 5 mg/325 mg daily as needed for pain #30. We will continue the opioid monitoring  program, this consists of regular clinic visits, examinations, urine drug screen, pill counts as well as use of [place] Controlled Substance Reporting system. A 12 month History has been reviewed on the [place] Controlled Substance Reporting System on 12/20/2020     F/U in 1 month

## 2020-12-21 ENCOUNTER — Encounter: Payer: Self-pay | Admitting: Registered Nurse

## 2020-12-28 ENCOUNTER — Encounter: Payer: Self-pay | Admitting: Family Medicine

## 2020-12-28 ENCOUNTER — Ambulatory Visit (INDEPENDENT_AMBULATORY_CARE_PROVIDER_SITE_OTHER): Payer: 59 | Admitting: Family Medicine

## 2020-12-28 ENCOUNTER — Other Ambulatory Visit: Payer: Self-pay

## 2020-12-28 VITALS — BP 131/84 | HR 70 | Ht 67.0 in | Wt 248.0 lb

## 2020-12-28 DIAGNOSIS — F411 Generalized anxiety disorder: Secondary | ICD-10-CM | POA: Diagnosis not present

## 2020-12-28 DIAGNOSIS — I10 Essential (primary) hypertension: Secondary | ICD-10-CM | POA: Diagnosis not present

## 2020-12-28 DIAGNOSIS — M79672 Pain in left foot: Secondary | ICD-10-CM

## 2020-12-28 DIAGNOSIS — G43709 Chronic migraine without aura, not intractable, without status migrainosus: Secondary | ICD-10-CM

## 2020-12-28 DIAGNOSIS — F17200 Nicotine dependence, unspecified, uncomplicated: Secondary | ICD-10-CM

## 2020-12-28 DIAGNOSIS — Z130 Encounter for screening for diseases of the blood and blood-forming organs and certain disorders involving the immune mechanism: Secondary | ICD-10-CM

## 2020-12-28 DIAGNOSIS — H9209 Otalgia, unspecified ear: Secondary | ICD-10-CM | POA: Insufficient documentation

## 2020-12-28 DIAGNOSIS — E782 Mixed hyperlipidemia: Secondary | ICD-10-CM

## 2020-12-28 DIAGNOSIS — H9202 Otalgia, left ear: Secondary | ICD-10-CM

## 2020-12-28 MED ORDER — BUSPIRONE HCL 7.5 MG PO TABS: 7.5000 mg | ORAL_TABLET | Freq: Two times a day (BID) | ORAL | 1 refills | Status: DC

## 2020-12-28 MED ORDER — MELOXICAM 15 MG PO TABS: 15.0000 mg | ORAL_TABLET | Freq: Every day | ORAL | 0 refills | Status: DC | PRN

## 2020-12-28 MED ORDER — ATORVASTATIN CALCIUM 20 MG PO TABS: 20.0000 mg | ORAL_TABLET | Freq: Every day | ORAL | 0 refills | Status: DC

## 2020-12-28 NOTE — Assessment & Plan Note (Signed)
Patient states that this is uncontrolled at this time. Advised to continue Klonopin at this time.  We will try a trial of BuSpar.

## 2020-12-28 NOTE — Assessment & Plan Note (Signed)
Exam normal.  Supportive care.

## 2020-12-28 NOTE — Progress Notes (Signed)
Subjective:  Patient ID: Teresa Vincent, female    DOB: Jan 30, 1981  Age: 40 y.o. MRN: [id number]  CC: Chief Complaint  Patient presents with   Hyperlipidemia    Follow up- establish care Patient states her left ear is bothering her Heel spur in left foot getting worse Needs refill Lipitor     HPI:  40 year old female presents for follow up and to establish care with me.   Left foot pain Patient reports that she has had ongoing left foot pain. Has seen podiatry and had an injection.  Told she had a calcaneal spur.  Patient states that she was also placed in a boot and states that she improved following this but has worsened yet again. She has not followed up with the podiatrist. Pain is localized to the plantar aspect of the left heel.  She reports associated swelling. She has tried stretching without resolution.  She has also tried new inserts in her shoes.  No other interventions tried. Patient states that the pain is quite troublesome and interferes with her gait.  Left ear pain Patient reports left ear pain for the past few days.  Some associated dizziness. No respiratory symptoms. No fever. No relieving factors.  Hyperlipidemia Stable. Last LDL 98 (04/23/20). Tolerating Lipitor without any side effects/concerns. Needs refill today.  Anxiety Patient reports chronic anxiety. Previously on Xanax and was transitioned to Klonopin. Reports that Klonopin makes her drowsy (helps with sleep); Cannot use during the day due to this. Would like to discuss alternative treatment options today.  Has previously taken Effexor, Celexa, and Lexapro and did not respond well. Anxiety is quite troublesome particularly  during the day and when she is by herself.  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/10/2015   Class 2 severe obesity due to excess calories with serious comorbidity and body mass index (BMI) of 36.0 to 36.9 in adult [facility]) 06/10/2015   Essential hypertension, benign 12/30/2012   Esophageal reflux 12/30/2012   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   Anxiety state 08/30/2007   Migraine headache 08/30/2007   Insomnia 08/30/2007    Social Hx   Social History   Socioeconomic History   Marital status: Married    Spouse name: Not on file   Number of children: Not on file   Years of education: Not on file   Highest education level: Not on file  Occupational History   Occupation: Doesn't work outside the home  Tobacco Use   Smoking status: Every Day    Packs/day: 1.00    Years: 26.00    Pack years: 26.00    Types: Cigarettes    Last attempt to quit: 06/19/2019    Years since quitting: 1.5   Smokeless tobacco: Former  Building services engineer Use: Every day   Substances: Nicotine, Flavoring  Substance and Sexual Activity   Alcohol use: Yes    Alcohol/week: 0.0 standard drinks    Comment: occasional   Drug use: Not Currently    Types: Marijuana    Comment: occasional   Sexual activity: Not Currently  Birth control/protection: Surgical  Other Topics Concern   Not on file  Social History Narrative   Not on file   Social Determinants of Health   Financial Resource Strain: Not on file  Food Insecurity: Not on file  Transportation Needs: Not on file  Physical Activity: Not on file  Stress: Not on file  Social Connections: Not on file    Review of Systems Per HPI  Objective:  BP 131/84   Pulse 70   Ht 5\' 7"  (1.702 m)   Wt 248 lb (112.5 kg)   LMP 02/25/2016   SpO2 98%   BMI 38.84 kg/m   BP/Weight 12/28/2020 12/20/2020 11/08/2020   Systolic BP 131 100 131  Diastolic BP 84 70 86  Wt. (Lbs) 248 247 250  BMI 38.84 38.69 39.16    Physical Exam Vitals and nursing note reviewed.  Constitutional:      General: She is not in acute distress.    Appearance: Normal appearance. She is obese. She is not ill-appearing.  HENT:     Head: Normocephalic and atraumatic.     Right Ear: Tympanic membrane normal.     Left Ear: Tympanic membrane normal.  Eyes:     General:        Right eye: No discharge.        Left eye: No discharge.     Conjunctiva/sclera: Conjunctivae normal.  Cardiovascular:     Rate and Rhythm: Normal rate and regular rhythm.  Pulmonary:     Effort: Pulmonary effort is normal.     Breath sounds: Normal breath sounds. No wheezing or rales.  Musculoskeletal:     Comments: Left foot -tenderness over the plantar aspect of left heel at the attachment site of the plantar fascia.  Mild swelling.  Neurological:     Mental Status: She is alert.  Psychiatric:        Mood and Affect: Mood normal.        Behavior: Behavior normal.    Lab Results  Component Value Date   WBC 19.5 (H) 06/26/2019   HGB 13.9 06/26/2019   HCT 41.4 06/26/2019   PLT 299 06/26/2019   GLUCOSE 106 (H) 06/26/2019   CHOL 167 04/23/2020   TRIG 214 (H) 04/23/2020   HDL 38 (L) 04/23/2020   LDLCALC 98 04/23/2020   ALT 17 06/26/2019   AST 21 06/26/2019   NA 137 06/26/2019   K 4.2 06/26/2019   CL 106 06/26/2019   CREATININE 0.87 06/26/2019   BUN 19 06/26/2019   CO2 22 06/26/2019   TSH 15.06 (H) 10/22/2020   INR 0.89 02/20/2018   HGBA1C 6.0 10/27/2020     Assessment & Plan:   Problem List Items Addressed This Visit       Cardiovascular and Mediastinum   Essential hypertension, benign - Primary   Relevant Medications   atorvastatin (LIPITOR) 20 MG tablet   Other Relevant Orders   Comprehensive metabolic panel   Migraine headache   Relevant Medications   atorvastatin (LIPITOR) 20 MG tablet   meloxicam (MOBIC) 15 MG  tablet     Other   Anxiety state    Patient states that this is uncontrolled at this time. Advised to continue Klonopin at this time.  We will try a trial of BuSpar.      Relevant Medications   busPIRone (BUSPAR) 7.5 MG tablet   Current smoker   Mixed hyperlipidemia    Stable. Refilled Lipitor today.  Relevant Medications   atorvastatin (LIPITOR) 20 MG tablet   Otalgia    Exam normal.  Supportive care.      Pain of left heel    Advised use of heel cups.  Advised ice and elevation.  Meloxicam as directed. If persist, will need to return to podiatry.      Other Visit Diagnoses     Screening for deficiency anemia       Relevant Orders   CBC       Follow-up:  Return 1-3 months, for Anxiety.  Everlene Other DO [facility]

## 2020-12-28 NOTE — Patient Instructions (Signed)
Labs today.  Medication as prescribed.   Follow up in 1-3 months regarding the anxiety.  Heel cups and Mobic for the foot pain.  Take care  Dr. Adriana Simas

## 2020-12-28 NOTE — Assessment & Plan Note (Signed)
Advised use of heel cups.  Advised ice and elevation.  Meloxicam as directed. If persist, will need to return to podiatry.

## 2020-12-28 NOTE — Assessment & Plan Note (Signed)
Stable. Refilled Lipitor today.

## 2020-12-29 LAB — COMPREHENSIVE METABOLIC PANEL
ALT: 13 IU/L (ref 0–32)
AST: 15 IU/L (ref 0–40)
Albumin/Globulin Ratio: 1.8 (ref 1.2–2.2)
Albumin: 4.5 g/dL (ref 3.8–4.8)
Alkaline Phosphatase: 81 IU/L (ref 44–121)
BUN/Creatinine Ratio: 14 (ref 9–23)
BUN: 11 mg/dL (ref 6–24)
Bilirubin Total: 0.3 mg/dL (ref 0.0–1.2)
CO2: 23 mmol/L (ref 20–29)
Calcium: 9.7 mg/dL (ref 8.7–10.2)
Chloride: 102 mmol/L (ref 96–106)
Creatinine, Ser: 0.81 mg/dL (ref 0.57–1.00)
Globulin, Total: 2.5 g/dL (ref 1.5–4.5)
Glucose: 101 mg/dL — ABNORMAL HIGH (ref 70–99)
Potassium: 5.2 mmol/L (ref 3.5–5.2)
Sodium: 140 mmol/L (ref 134–144)
Total Protein: 7 g/dL (ref 6.0–8.5)
eGFR: 94 mL/min/{1.73_m2} (ref >59)

## 2020-12-29 LAB — CBC
Hematocrit: 43.5 % (ref 34.0–46.6)
Hemoglobin: 14.6 g/dL (ref 11.1–15.9)
MCH: 30.5 pg (ref 26.6–33.0)
MCHC: 33.6 g/dL (ref 31.5–35.7)
MCV: 91 fL (ref 79–97)
Platelets: 250 10*3/uL (ref 150–450)
RBC: 4.79 x10E6/uL (ref 3.77–5.28)
RDW: 12.7 % (ref 11.7–15.4)
WBC: 8.7 10*3/uL (ref 3.4–10.8)

## 2021-01-07 ENCOUNTER — Encounter (HOSPITAL_BASED_OUTPATIENT_CLINIC_OR_DEPARTMENT_OTHER): Payer: 59 | Admitting: Physical Medicine and Rehabilitation

## 2021-01-07 ENCOUNTER — Encounter: Payer: Self-pay | Admitting: Physical Medicine and Rehabilitation

## 2021-01-07 ENCOUNTER — Other Ambulatory Visit: Payer: Self-pay

## 2021-01-07 VITALS — BP 111/75 | HR 76 | Ht 67.0 in | Wt 254.6 lb

## 2021-01-07 DIAGNOSIS — Z981 Arthrodesis status: Secondary | ICD-10-CM

## 2021-01-07 DIAGNOSIS — F411 Generalized anxiety disorder: Secondary | ICD-10-CM | POA: Diagnosis not present

## 2021-01-07 MED ORDER — OXYCODONE-ACETAMINOPHEN 5-325 MG PO TABS: 1.0000 | ORAL_TABLET | Freq: Two times a day (BID) | ORAL | 0 refills | Status: DC | PRN

## 2021-01-07 NOTE — Patient Instructions (Signed)
Anxiety: -exercise and meditation are tools to decrease anxiety. -Down Dog Yoga app -Spend time outdoors. -Positive re-framing of anxiety.  -Discussed the following foods that have been show to reduce anxiety: 1) [place] nuts, mushrooms, soy beans due to their high selenium content. Upper limit of toxicity of selenium is 469mcg/day so no more than 3-4 [place] nuts per day.  2) Fatty fish such as salmon, mackerel, sardines, trout, and herring- high in omega-3 fatty acids 3) Eggs- increases serotonin and dopamine 4) Pumpkin seeds- high in omega-3 fatty acids 5) dark chocolate- high in flavanols that increase blood flow to brain 6) turmeric- take with black pepper to increase absorption 7) chamomile tea- antioxidant and anti-inflammatory properties 8) yogurt without sugar- supports gut-brain axis 9) green tea- contains L- theanine 10) blueberries- high in vitamin C and antioxidants 11) [place]- high in tryptophan which gets converted to serotonin 12) bell peppers- rich in vitamin C and antioxidants 13) citrus fruits- rich in vitamin C and antioxidants 14) almonds- high in vitamin E and healthy fats 15) chia seeds- high in omega-3 fatty acids 16) saffron

## 2021-01-07 NOTE — Progress Notes (Signed)
Subjective:    Patient ID: [name], female    DOB: Aug 16, 1980, 40 y.o.   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  -Pain has been present for years -She has had two back surgeries.  -She has had 7 injections but got no relief from these.  -She has gone back to Dr. Yetta Barre who did her surgery.  -She was on oxycodone 5mg  that she broke in half to 2.5s which helped. This helped her to get through the day. She has been on it for years. She has been following with .  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  -feels better when laying down, but also painful while sitting -today has been a bad day -she uses her electric blanket and heating pad -she has been working at 05-03-1984- she is the cold at 5am in the morning and this is when the pain is worse.  -the colder it is the worse it is hurting  2) left knee pain: -has had steroid injection with benefit.  -this has continued to benefit.   3) left foot pain -prescribed meloxicam by Dr. [employer]  4) Anxiety -she gets anxious about driving and she gets palpitations -she has had a prior stroke and has felt this anxiety since this time.    Pain Inventory Average Pain 8 Pain Right Now 8 My pain is sharp, burning, dull, stabbing, tingling and aching  In the last 24 hours, has pain interfered with the following? General activity 5 Relation with others 5 Enjoyment of life 8 What TIME of day is your pain at its worst? morning , daytime, evening and night Sleep (in general) Poor  Pain is worse with: walking, bending, sitting, inactivity, standing and some activites Pain improves with: medication Relief from Meds: 7     Family History  Problem Relation  Age of Onset   Hypertension Mother    Thyroid disease Neg Hx    Social History   Socioeconomic History   Marital status: Married    Spouse name: Not on file   Number of children: Not on file   Years of education: Not on file   Highest education level: Not on file  Occupational History   Occupation: Doesn't work outside the home  Tobacco Use   Smoking status: Every Day    Packs/day: 1.00    Years: 26.00    Pack years: 26.00    Types: Cigarettes    Last attempt to quit: 06/19/2019    Years since quitting: 1.5   Smokeless tobacco: Former  08/19/2019 Use: Every day   Substances: Nicotine, Flavoring  Substance and Sexual Activity   Alcohol use: Yes    Alcohol/week: 0.0 standard drinks    Comment: occasional   Drug use: Not Currently    Types: Marijuana    Comment: occasional   Sexual activity: Not Currently    Birth control/protection: Surgical  Other Topics Concern   Not on file  Social History Narrative   Not on file   Social Determinants of Health   Financial Resource Strain: Not on file  Food Insecurity: Not on file  Transportation Needs: Not on file  Physical Activity: Not on  file  Stress: Not on file  Social Connections: Not on file   Past Surgical History:  Procedure Laterality Date   ABDOMINAL EXPOSURE N/A 05/29/2017   Procedure: ABDOMINAL EXPOSURE;  Surgeon: Chuck Hint, MD;  Location: [facility] OR;  Service: Vascular;  Laterality: N/A;   ABDOMINAL HYSTERECTOMY     ANTERIOR LUMBAR FUSION N/A 05/29/2017   Procedure: Anterior Lumbar Interbody Fusion  - Lumbar five sacral one;  Surgeon: Tia Alert, MD;  Location: [facility] OR;  Service: Neurosurgery;  Laterality: N/A;   BIOPSY  02/17/2020   Procedure: BIOPSY;  Surgeon: Lanelle Bal, DO;  Location: [facility];  Service: Endoscopy;;  gastric   CARPAL TUNNEL RELEASE Bilateral    CHOLECYSTECTOMY     CHOLECYSTECTOMY N/A 12/29/2016   Procedure: LAPAROSCOPIC CHOLECYSTECTOMY;  Surgeon: [name], MD;  Location: [facility] ORS;  Service: General;  Laterality: N/A;   COLONOSCOPY WITH PROPOFOL N/A 02/17/2020   non-bleeding internal hemorrhoids, otherwise normal.   CYSTOSCOPY/URETEROSCOPY/HOLMIUM LASER/STENT PLACEMENT Right 06/25/2019   Procedure: CYSTOSCOPY WITH RIGHT RETROGRADE PYELOGRAM/RIGHT URETEROSCOPY/HOLMIUM LASER APPLICATION RIGHT URETERAL CALCULUS/RIGHT URETERAL STENT PLACEMENT;  Surgeon: Malen Gauze, MD;  Location: [facility];  Service: Urology;  Laterality: Right;   ESOPHAGOGASTRODUODENOSCOPY (EGD) WITH PROPOFOL N/A 02/17/2020   Procedure: ESOPHAGOGASTRODUODENOSCOPY (EGD) WITH PROPOFOL;  Surgeon: Lanelle Bal, DO;  Location: [facility];  Service: Endoscopy;  Laterality: N/A;   EXTRACORPOREAL SHOCK WAVE LITHOTRIPSY Right 04/10/2019   Procedure: EXTRACORPOREAL SHOCK WAVE LITHOTRIPSY (ESWL);  Surgeon: Noel Christmas, MD;  Location: [facility];  Service: Urology;  Laterality: Right;   LAMINECTOMY WITH POSTERIOR LATERAL ARTHRODESIS LEVEL 1 N/A 02/28/2018   Procedure: Laminectomy and Foraminotomy Lumbar Five-Sacral One, posterolateral fusion and fixation;  Surgeon: Tia Alert, MD;  Location: [facility] OR;  Service: Neurosurgery;  Laterality: N/A;  posterior   LUMBAR EPIDURAL INJECTION     STERIOD INJECTION N/A 01/10/2017   Procedure: MINOR EXPAREL INJECTION;  Surgeon: [name], MD;  Location: [facility];  Service: General;  Laterality: N/A;   TOTAL ABDOMINAL HYSTERECTOMY W/ BILATERAL SALPINGOOPHORECTOMY N/A    TUBAL LIGATION  2003   Past Medical History:  Diagnosis Date   Abnormal Pap smear of cervix    ANXIETY 08/30/2007   Asthma    Chronic back pain    Claustrophobia    pt requests Versed before surgery.   Complication of anesthesia    hx of waking up during anesthesia   GERD (gastroesophageal reflux disease)    History of kidney stones    Hypertension    HYPOTHYROIDISM, POST-RADIATION 02/08/2009   INSOMNIA 08/30/2007   MIGRAINE HEADACHE  08/30/2007   Neck pain    Palpitations    occasionally couple times a week   Sciatica    Sleep apnea    cpap   Stroke (HCC) 2010   states, no deficits and on no meds now.   BP 111/75   Pulse 76   Ht 5\' 7"  (1.702 m)   Wt 254 lb 9.6 oz (115.5 kg)   LMP 02/25/2016   SpO2 96%   BMI 39.88 kg/m   Opioid Risk Score:   Fall Risk Score:  `1  Depression screen PHQ 2/9  Depression screen [facility] 2/9 12/20/2020 10/12/2020 09/10/2020 07/21/2020 06/23/2020 05/27/2020 04/26/2020  Decreased Interest 0 0 0 0 3 0 0  Down, Depressed, Hopeless 0 0 0 0 0 0 1  PHQ - 2 Score 0 0 0 0 3 0 1  Altered sleeping - -  3 - 3 3 -  Tired, decreased energy - - 1 - 1 1 -  Change in appetite - - 0 - 2 3 -  Feeling bad or failure about yourself  - - 0 - 0 0 -  Trouble concentrating - - 0 - 0 0 -  Moving slowly or fidgety/restless - - 0 - 0 0 -  Suicidal thoughts - - 0 - 0 0 -  PHQ-9 Score - - 4 - 9 7 -  Difficult doing work/chores - - Not difficult at all - Somewhat difficult - -  Some recent data might be hidden    Review of Systems  Constitutional:  Positive for unexpected weight change.  HENT: Negative.    Eyes: Negative.   Respiratory: Negative.    Cardiovascular:  Positive for leg swelling.  Gastrointestinal: Negative.   Endocrine: Negative.   Genitourinary: Negative.   Musculoskeletal:  Positive for arthralgias and back pain.  Skin: Negative.   Allergic/Immunologic: Negative.   Neurological:  Positive for numbness.       Tingling  Hematological: Negative.   Psychiatric/Behavioral:  The patient is nervous/anxious.   All other systems reviewed and are negative.     Objective:   Physical Exam Gen: no distress, normal appearing HEENT: oral mucosa pink and moist, NCAT Cardio: Reg rate Chest: normal effort, normal rate of breathing Abd: soft, non-distended Ext: no edema Psych: pleasant, normal affect Skin: intact Neuro: Alert and oriented x3 Musculoskeletal: Tenderness to palpation in bilateral  lower back. Slump test reproduces pain in lower back. 5/5 strength throughout. Pain is present with both flexion and extension.    Assessment & Plan:  Mrs. Nellums is a 40 year old woman who presents to establish care for lumbar spinal stenosis.  1) Lumbar spinal stenosis with lumbar neurogenic claudication.  -Discussed benefits of exercise in reducing pain. -She is allergic to bupivicaine, she has tried surgery, injections, medication. Oxycodone 5mg  helped. She has never failed a drug screen. She works at . Increase oxycodone to 5mg  daily.  -Will obtain UDS and pain contract today. If results negative, can prescribe Percocet 5mg -325 monthly PRN.  Lumbar MRI reviewed as follows:  IMPRESSION: 1. No impingement identified in the lumbar spine. 2. 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 3. Degenerative disc disease at T11-12, L2- 3, L3-4, and L5-S1. 4. Cholelithiasis.  -Discussed following foods that may reduce pain: 1) Ginger (especially studied for arthritis)- reduce leukotriene production to decrease inflammation 2) Blueberries- high in phytonutrients that decrease inflammation 3) Salmon- marine omega-3s reduce joint swelling and pain 4) Pumpkin seeds- reduce inflammation 5) dark chocolate- reduces inflammation 6) turmeric- reduces inflammation 7) tart cherries - reduce pain and stiffness 8) extra virgin olive oil - its compound olecanthal helps to block prostaglandins  9) chili peppers- can be eaten or applied topically via capsaicin 10) mint- helpful for headache, muscle aches, joint pain, and itching 11) garlic- reduces inflammation  Link to further information on diet for chronic pain: [employer]    2) Obesity: weight 246 lbs, BMI 38.37.  -Educated regarding health benefits of weight loss- for pain, general health, chronic disease prevention, immune health,  mental health.  -Will monitor weight every visit.  Recommended [place] tea daily.  -Discussed foods that can assist in weight loss: 1) leafy greens- high in fiber and nutrients 2) dark chocolate- improves metabolism (if prefer sweetened, best to sweeten with honey instead of sugar).  3) cruciferous vegetables- high in fiber and  protein 4) full fat yogurt: high in healthy fat, protein, calcium, and probiotics 5) apples- high in a variety of phytochemicals 6) nuts- high in fiber and protein that increase feelings of fullness 7) grapefruit: rich in nutrients, antioxidants, and fiber (not to be taken with anticoagulation) 8) beans- high in protein and fiber 9) salmon- has high quality protein and healthy fats 10) green tea- rich in polyphenols 11) eggs- rich in choline and vitamin D 12) tuna- high protein, boosts metabolism 13) avocado- decreases visceral abdominal fat 14) chicken (pasture raised): high in protein and iron 15) blueberries- reduce abdominal fat and cholesterol 16) whole grains- decreases calories retained during digestion, speeds metabolism 17) chia seeds- curb appetite 18) chilies- increases fat metabolism    3) Anxiety: -Discussed exercise and meditation as tools to decrease anxiety. -Recommended Down Dog Yoga app -Discussed spending time outdoors. -Discussed positive re-framing of anxiety.  -Discussed neuropsych follow-up- she will consider.  -Discussed the following foods that have been show to reduce anxiety: 1) [place] nuts, mushrooms, soy beans due to their high selenium content. Upper limit of toxicity of selenium is 45mcg/day so no more than 3-4 [place] nuts per day.  2) Fatty fish such as salmon, mackerel, sardines, trout, and herring- high in omega-3 fatty acids 3) Eggs- increases serotonin and dopamine 4) Pumpkin seeds- high in omega-3 fatty acids 5) dark chocolate- high in flavanols that increase blood flow to brain 6) turmeric- take with black pepper to  increase absorption 7) chamomile tea- antioxidant and anti-inflammatory properties 8) yogurt without sugar- supports gut-brain axis 9) green tea- contains L- theanine 10) blueberries- high in vitamin C and antioxidants 11) [place]- high in tryptophan which gets converted to serotonin 12) bell peppers- rich in vitamin C and antioxidants 13) citrus fruits- rich in vitamin C and antioxidants 14) almonds- high in vitamin E and healthy fats 15) chia seeds- high in omega-3 fatty acids 16) saffron

## 2021-01-24 ENCOUNTER — Ambulatory Visit
Admission: EM | Admit: 2021-01-24 | Discharge: 2021-01-24 | Disposition: A | Discharge status: Home / Self Care | Payer: 59 | Attending: Urgent Care | Admitting: Urgent Care

## 2021-01-24 ENCOUNTER — Other Ambulatory Visit: Payer: Self-pay

## 2021-01-24 DIAGNOSIS — R070 Pain in throat: Secondary | ICD-10-CM | POA: Insufficient documentation

## 2021-01-24 DIAGNOSIS — Z1152 Encounter for screening for COVID-19: Secondary | ICD-10-CM | POA: Insufficient documentation

## 2021-01-24 DIAGNOSIS — R509 Fever, unspecified: Secondary | ICD-10-CM | POA: Diagnosis present

## 2021-01-24 DIAGNOSIS — R052 Subacute cough: Secondary | ICD-10-CM | POA: Diagnosis present

## 2021-01-24 DIAGNOSIS — B349 Viral infection, unspecified: Secondary | ICD-10-CM | POA: Insufficient documentation

## 2021-01-24 LAB — POCT RAPID STREP A (OFFICE): Rapid Strep A Screen: NEGATIVE

## 2021-01-24 MED ORDER — OSELTAMIVIR PHOSPHATE 75 MG PO CAPS: 75.0000 mg | ORAL_CAPSULE | Freq: Two times a day (BID) | ORAL | 0 refills | Status: DC

## 2021-01-24 MED ORDER — IBUPROFEN 800 MG PO TABS
800.0000 mg | ORAL_TABLET | Freq: Once | ORAL | Status: AC
  Administered 2021-01-24: 800 mg via ORAL

## 2021-01-24 MED ORDER — PSEUDOEPHEDRINE HCL 30 MG PO TABS: 30.0000 mg | ORAL_TABLET | Freq: Three times a day (TID) | ORAL | 0 refills | Status: DC | PRN

## 2021-01-24 MED ORDER — CETIRIZINE HCL 10 MG PO TABS: 10.0000 mg | ORAL_TABLET | Freq: Every day | ORAL | 0 refills | Status: DC

## 2021-01-24 MED ORDER — BENZONATATE 100 MG PO CAPS: 100.0000 mg | ORAL_CAPSULE | Freq: Three times a day (TID) | ORAL | 0 refills | Status: DC | PRN

## 2021-01-24 MED ORDER — PROMETHAZINE-DM 6.25-15 MG/5ML PO SYRP: 5.0000 mL | ORAL_SOLUTION | Freq: Every evening | ORAL | 0 refills | Status: DC | PRN

## 2021-01-24 NOTE — ED Triage Notes (Signed)
Patient presents to Urgent Care with complaints of fever and body aches since today. Sore throat since last night. Last dose of Percocet at 0927 today.

## 2021-01-24 NOTE — ED Provider Notes (Signed)
[facility]   MRN: [id number] DOB: 1980-04-18  Subjective:   Teresa Vincent is a 40 y.o. female presenting for 1 day history of acute onset fever, body aches, throat pain, throat congestion.  Denies chest pain, shortness of breath or wheezing.  Patient quit vaping recently.  She is not smoking.  Has used Percocet twice today.  No current facility-administered medications for this encounter.  Current Outpatient Medications:    atorvastatin (LIPITOR) 20 MG tablet, Take 1 tablet (20 mg total) by mouth daily., Disp: 90 tablet, Rfl: 0   busPIRone (BUSPAR) 7.5 MG tablet, Take 1 tablet (7.5 mg total) by mouth 2 (two) times daily. (Patient not taking: Reported on 01/07/2021), Disp: 60 tablet, Rfl: 1   Cholecalciferol (D3) 50 MCG (2000 UT) TABS, Take by mouth daily., Disp: , Rfl:    cholestyramine (QUESTRAN) 4 GM/DOSE powder, One scoop BID prn diarrhea (Patient taking differently: Take 4 g by mouth 2 (two) times daily as needed (diarrhea).), Disp: 378 g, Rfl: 3   clonazePAM (KLONOPIN) 0.5 MG tablet, Take 1/2 tab p.o. qhs for insomnia., Disp: 30 tablet, Rfl: 1   cyclobenzaprine (FLEXERIL) 10 MG tablet, Take 1 tablet (10 mg total) by mouth 3 (three) times daily as needed. for muscle spams, Disp: 90 tablet, Rfl: 1   levothyroxine (SYNTHROID) 175 MCG tablet, TAKE 1 TABLET BY MOUTH ONCE DAILY BEFORE BREAKFAST, Disp: 90 tablet, Rfl: 1   lisinopril (ZESTRIL) 20 MG tablet, Take 1 tablet (20 mg total) by mouth daily., Disp: 90 tablet, Rfl: 1   meloxicam (MOBIC) 15 MG tablet, Take 1 tablet (15 mg total) by mouth daily as needed for pain. (Patient not taking: Reported on 01/07/2021), Disp: 30 tablet, Rfl: 0   metoprolol succinate (TOPROL-XL) 50 MG 24 hr tablet, Take 1 tablet by mouth daily., Disp: , Rfl:    omeprazole (PRILOSEC) 40 MG capsule, TAKE 1 CAPSULE BY MOUTH TWICE DAILY BEFORE A MEAL, Disp: 180 capsule, Rfl: 1   ondansetron (ZOFRAN-ODT) 4 MG disintegrating tablet, Take one tablet  every 8 hours prn, Disp: 30 tablet, Rfl: 0   oxyCODONE-acetaminophen (PERCOCET) 5-325 MG tablet, Take 1 tablet by mouth 2 (two) times daily as needed for severe pain. Do Not Fill Before 12/03/2020, Disp: 60 tablet, Rfl: 0   Allergies  Allergen Reactions   Bupivacaine Swelling    Pt received CT contrast and Bupivacaine at the same time. Unsure which one caused the reaction. Dr. Mosetta Putt recommends a 13 hour prep before either one   Contrast Media [Iodinated Diagnostic Agents] Swelling    Pt received CT contrast and Bupivacaine at the same time. Unsure which one caused the reaction. Dr. Mosetta Putt recommends a 13 hour prep before either one   Indomethacin Other (See Comments)    Dizziness, near syncope, bp went up and BG went down.   Adhesive [Tape] Other (See Comments)    Skin irritation, patient prefers paper tape.    Celexa [Citalopram Hydrobromide] Other (See Comments)    Make patient too carefree    Past Medical History:  Diagnosis Date   Abnormal Pap smear of cervix    ANXIETY 08/30/2007   Asthma    Chronic back pain    Claustrophobia    pt requests Versed before surgery.   Complication of anesthesia    hx of waking up during anesthesia   GERD (gastroesophageal reflux disease)    History of kidney stones    Hypertension    HYPOTHYROIDISM, POST-RADIATION 02/08/2009   INSOMNIA 08/30/2007  MIGRAINE HEADACHE 08/30/2007   Neck pain    Palpitations    occasionally couple times a week   Sciatica    Sleep apnea    cpap   Stroke [facility]) 2010   states, no deficits and on no meds now.     Past Surgical History:  Procedure Laterality Date   ABDOMINAL EXPOSURE N/A 05/29/2017   Procedure: ABDOMINAL EXPOSURE;  Surgeon: Chuck Hint, MD;  Location: [facility] OR;  Service: Vascular;  Laterality: N/A;   ABDOMINAL HYSTERECTOMY     ANTERIOR LUMBAR FUSION N/A 05/29/2017   Procedure: Anterior Lumbar Interbody Fusion  - Lumbar five sacral one;  Surgeon: Tia Alert, MD;  Location: [facility] OR;   Service: Neurosurgery;  Laterality: N/A;   BIOPSY  02/17/2020   Procedure: BIOPSY;  Surgeon: Lanelle Bal, DO;  [facility];  Service: Endoscopy;;  gastric   CARPAL TUNNEL RELEASE Bilateral    CHOLECYSTECTOMY     CHOLECYSTECTOMY N/A 12/29/2016   Procedure: LAPAROSCOPIC CHOLECYSTECTOMY;  Surgeon: [name], MD;  Location: [facility];  Service: General;  Laterality: N/A;   COLONOSCOPY WITH PROPOFOL N/A 02/17/2020   non-bleeding internal hemorrhoids, otherwise normal.   CYSTOSCOPY/URETEROSCOPY/HOLMIUM LASER/STENT PLACEMENT Right 06/25/2019   Procedure: CYSTOSCOPY WITH RIGHT RETROGRADE PYELOGRAM/RIGHT URETEROSCOPY/HOLMIUM LASER APPLICATION RIGHT URETERAL CALCULUS/RIGHT URETERAL STENT PLACEMENT;  Surgeon: Malen Gauze, MD;  Location: [facility];  Service: Urology;  Laterality: Right;   ESOPHAGOGASTRODUODENOSCOPY (EGD) WITH PROPOFOL N/A 02/17/2020   Procedure: ESOPHAGOGASTRODUODENOSCOPY (EGD) WITH PROPOFOL;  Surgeon: Lanelle Bal, DO;  [facility];  Service: Endoscopy;  Laterality: N/A;   EXTRACORPOREAL SHOCK WAVE LITHOTRIPSY Right 04/10/2019   Procedure: EXTRACORPOREAL SHOCK WAVE LITHOTRIPSY (ESWL);  Surgeon: Noel Christmas, MD;  Location: [facility];  Service: Urology;  Laterality: Right;   LAMINECTOMY WITH POSTERIOR LATERAL ARTHRODESIS LEVEL 1 N/A 02/28/2018   Procedure: Laminectomy and Foraminotomy Lumbar Five-Sacral One, posterolateral fusion and fixation;  Surgeon: Tia Alert, MD;  Location: [facility] OR;  Service: Neurosurgery;  Laterality: N/A;  posterior   LUMBAR EPIDURAL INJECTION     STERIOD INJECTION N/A 01/10/2017   Procedure: MINOR EXPAREL INJECTION;  Surgeon: [name], MD;  Location: [facility];  Service: General;  Laterality: N/A;   TOTAL ABDOMINAL HYSTERECTOMY W/ BILATERAL SALPINGOOPHORECTOMY N/A    TUBAL LIGATION  2003    Family History  Problem Relation Age of Onset   Hypertension Mother    Thyroid disease Neg Hx      Social History   Tobacco Use   Smoking status: Every Day    Packs/day: 1.00    Years: 26.00    Pack years: 26.00    Types: Cigarettes    Last attempt to quit: 06/19/2019    Years since quitting: 1.6   Smokeless tobacco: Former  Building services engineer Use: Every day   Substances: Nicotine, Flavoring  Substance Use Topics   Alcohol use: Yes    Alcohol/week: 0.0 standard drinks    Comment: occasional   Drug use: Not Currently    Types: Marijuana    Comment: occasional    ROS   Objective:   Vitals: BP (!) 146/86 (BP Location: Right Arm)   Pulse (!) 105   Temp (!) 101.6 F (38.7 C) (Oral)   Resp 18   LMP 02/25/2016   SpO2 98%   Physical Exam Constitutional:      General: She is not in acute distress.    Appearance: Normal appearance.  She is well-developed. She is not ill-appearing, toxic-appearing or diaphoretic.  HENT:     Head: Normocephalic and atraumatic.     Right Ear: Tympanic membrane and ear canal normal. No drainage or tenderness. No middle ear effusion. Tympanic membrane is not erythematous.     Left Ear: Tympanic membrane and ear canal normal. No drainage or tenderness.  No middle ear effusion. Tympanic membrane is not erythematous.     Nose: Nose normal. No congestion or rhinorrhea.     Mouth/Throat:     Mouth: Mucous membranes are moist. No oral lesions.     Pharynx: Posterior oropharyngeal erythema present. No pharyngeal swelling, oropharyngeal exudate or uvula swelling.     Tonsils: No tonsillar exudate or tonsillar abscesses.  Eyes:     Extraocular Movements: Extraocular movements intact.     Right eye: Normal extraocular motion.     Left eye: Normal extraocular motion.     Conjunctiva/sclera: Conjunctivae normal.     Pupils: Pupils are equal, round, and reactive to light.  Cardiovascular:     Rate and Rhythm: Normal rate and regular rhythm.     Pulses: Normal pulses.     Heart sounds: Normal heart sounds. No murmur heard.   No friction rub. No  gallop.  Pulmonary:     Effort: Pulmonary effort is normal. No respiratory distress.     Breath sounds: Normal breath sounds. No stridor. No wheezing, rhonchi or rales.  Musculoskeletal:     Cervical back: Normal range of motion and neck supple.  Lymphadenopathy:     Cervical: No cervical adenopathy.  Skin:    General: Skin is warm and dry.     Findings: No rash.  Neurological:     General: No focal deficit present.     Mental Status: She is alert and oriented to person, place, and time.  Psychiatric:        Mood and Affect: Mood normal.        Behavior: Behavior normal.        Thought Content: Thought content normal.    Results for orders placed or performed during the hospital encounter of 01/24/21 (from the past 24 hour(s))  POCT rapid strep A     Status: None   Collection Time: 01/24/21 11:55 AM  Result Value Ref Range   Rapid Strep A Screen Negative Negative   Patient given ibuprofen for her fever.  Assessment and Plan :   PDMP not reviewed this encounter.  1. Viral syndrome   2. Encounter for screening for COVID-19   3. Fever, unspecified   4. Throat pain   5. Subacute cough    Deferred imaging given clear cardiopulmonary exam, hemodynamically stable vital signs. Will cover for influenza with Tamiflu given symptom set, acute onset, physical exam findings.  Use supportive care, rest, fluids, hydration, light meals, schedule Tylenol and ibuprofen. Counseled patient on potential for adverse effects with medications prescribed today, patient verbalized understanding. ER and return-to-clinic precautions discussed, patient verbalized understanding.    Wallis Bamberg, PA-C 01/24/21 1211

## 2021-01-25 LAB — COVID-19, FLU A+B NAA
Influenza A, NAA: NOT DETECTED
Influenza B, NAA: NOT DETECTED
SARS-CoV-2, NAA: DETECTED — AB

## 2021-01-27 LAB — CULTURE, GROUP A STREP (THRC)

## 2021-02-03 ENCOUNTER — Ambulatory Visit
Admission: EM | Admit: 2021-02-03 | Discharge: 2021-02-03 | Disposition: A | Discharge status: Home / Self Care | Payer: 59 | Attending: Emergency Medicine | Admitting: Emergency Medicine

## 2021-02-03 ENCOUNTER — Other Ambulatory Visit: Payer: Self-pay

## 2021-02-03 ENCOUNTER — Encounter: Payer: Self-pay | Admitting: Emergency Medicine

## 2021-02-03 DIAGNOSIS — J0140 Acute pansinusitis, unspecified: Secondary | ICD-10-CM | POA: Diagnosis not present

## 2021-02-03 DIAGNOSIS — M26622 Arthralgia of left temporomandibular joint: Secondary | ICD-10-CM | POA: Diagnosis not present

## 2021-02-03 DIAGNOSIS — H9202 Otalgia, left ear: Secondary | ICD-10-CM

## 2021-02-03 MED ORDER — AMOXICILLIN-POT CLAVULANATE 875-125 MG PO TABS: 1.0000 | ORAL_TABLET | Freq: Two times a day (BID) | ORAL | 0 refills | Status: DC

## 2021-02-03 MED ORDER — FLUTICASONE PROPIONATE 50 MCG/ACT NA SUSP: 2.0000 | Freq: Every day | NASAL | 0 refills | Status: DC

## 2021-02-03 MED ORDER — PREDNISONE 20 MG PO TABS: 40.0000 mg | ORAL_TABLET | Freq: Every day | ORAL | 0 refills | Status: AC

## 2021-02-03 MED ORDER — ALBUTEROL SULFATE HFA 108 (90 BASE) MCG/ACT IN AERS: 1.0000 | INHALATION_SPRAY | RESPIRATORY_TRACT | 0 refills | Status: DC | PRN

## 2021-02-03 MED ORDER — NAPROXEN 500 MG PO TABS: 500.0000 mg | ORAL_TABLET | Freq: Two times a day (BID) | ORAL | 0 refills | Status: DC

## 2021-02-03 NOTE — Discharge Instructions (Addendum)
Saline nasal irrigation, Mucinex, 2 puffs from albuterol inhaler using a spacer every 4-6 hours as needed.  Try some Mucinex.  This will help break up the nasal congestion.  Finish the Augmentin, if you feel better.  Soft diet, Naprosyn combined with 1000 mg of Tylenol twice a day, Flexeril at night to see if this does not help with your ear

## 2021-02-03 NOTE — ED Provider Notes (Signed)
HPI  SUBJECTIVE:  Teresa Vincent is a 40 y.o. female who presents with persistent nasal congestion, worsening chest congestion, cough productive of brown sputum with sinus pain and pressure, left ear and jaw pain, and wheezing.  She was diagnosed with flu 12 days ago.  No fevers, change in hearing, otorrhea, upper dental pain, facial swelling, shortness of breath.  No antibiotics in the past month.  No antipyretic in the past 6 hours.  She has been taking onion water, garlic/honey and she tried DayQuil once without improvement in her symptoms.  Symptoms worse with lying down.  She is unable to sleep at night secondary to the cough.  She has a past medical history of hypertension, prediabetes, palpitations, hypothyroidism, hypercholesterolemia.  She has an albuterol inhaler with a spacer already.  XTK:[id number], [name], DO   Past Medical History:  Diagnosis Date   Abnormal Pap smear of cervix    ANXIETY 08/30/2007   Asthma    Chronic back pain    Claustrophobia    pt requests Versed before surgery.   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    Past Surgical History:  Procedure Laterality Date   ABDOMINAL EXPOSURE N/A 05/29/2017   Procedure: ABDOMINAL EXPOSURE;  Surgeon: Chuck Hint, MD;  Location[facility] OR;  Service: Vascular;  Laterality: N/A;   ABDOMINAL HYSTERECTOMY     ANTERIOR LUMBAR FUSION N/A 05/29/2017   Procedure: Anterior Lumbar Interbody Fusion  - Lumbar five sacral one;  Surgeon: Tia Alert, MD;  Location: [facility] OR;  Service: Neurosurgery;  Laterality: N/A;   BIOPSY  02/17/2020   Procedure: BIOPSY;   Surgeon: Lanelle Bal, DO;  [facility];  Service: Endoscopy;;  gastric   CARPAL TUNNEL RELEASE Bilateral    CHOLECYSTECTOMY     CHOLECYSTECTOMY N/A 12/29/2016   Procedure: LAPAROSCOPIC CHOLECYSTECTOMY;  Surgeon: [name], MD;  Location: [facility];  Service: General;  Laterality: N/A;   COLONOSCOPY WITH PROPOFOL N/A 02/17/2020   non-bleeding internal hemorrhoids, otherwise normal.   CYSTOSCOPY/URETEROSCOPY/HOLMIUM LASER/STENT PLACEMENT Right 06/25/2019   Procedure: CYSTOSCOPY WITH RIGHT RETROGRADE PYELOGRAM/RIGHT URETEROSCOPY/HOLMIUM LASER APPLICATION RIGHT URETERAL CALCULUS/RIGHT URETERAL STENT PLACEMENT;  Surgeon: Malen Gauze, MD;  Location: [facility];  Service: Urology;  Laterality: Right;   ESOPHAGOGASTRODUODENOSCOPY (EGD) WITH PROPOFOL N/A 02/17/2020   Procedure: ESOPHAGOGASTRODUODENOSCOPY (EGD) WITH PROPOFOL;  Surgeon: Lanelle Bal, DO;  [facility];  Service: Endoscopy;  Laterality: N/A;   EXTRACORPOREAL SHOCK WAVE LITHOTRIPSY Right 04/10/2019   Procedure: EXTRACORPOREAL SHOCK WAVE LITHOTRIPSY (ESWL);  Surgeon: Noel Christmas, MD;  Location: [facility];  Service: Urology;  Laterality: Right;   LAMINECTOMY WITH POSTERIOR LATERAL ARTHRODESIS LEVEL 1 N/A 02/28/2018   Procedure: Laminectomy and Foraminotomy Lumbar Five-Sacral One, posterolateral fusion and fixation;  Surgeon: Tia Alert, MD;  Location: [facility] OR;  Service: Neurosurgery;  Laterality: N/A;  posterior   LUMBAR EPIDURAL INJECTION     STERIOD INJECTION N/A 01/10/2017   Procedure: MINOR EXPAREL INJECTION;  Surgeon: [name],  MD;  Location: [facility];  Service: General;  Laterality: N/A;   TOTAL ABDOMINAL HYSTERECTOMY W/ BILATERAL SALPINGOOPHORECTOMY N/A    TUBAL LIGATION  2003    Family History  Problem Relation Age of Onset   Hypertension Mother    Thyroid disease Neg Hx     Social History   Tobacco Use   Smoking status: Every Day    Packs/day: 1.00     Years: 26.00    Pack years: 26.00    Types: Cigarettes    Last attempt to quit: 06/19/2019    Years since quitting: 1.6   Smokeless tobacco: Former  Building services engineer Use: Every day   Substances: Nicotine, Flavoring  Substance Use Topics   Alcohol use: Yes    Alcohol/week: 0.0 standard drinks    Comment: occasional   Drug use: Not Currently    Types: Marijuana    Comment: occasional    No current facility-administered medications for this encounter.  Current Outpatient Medications:    albuterol (VENTOLIN HFA) 108 (90 Base) MCG/ACT inhaler, Inhale 1-2 puffs into the lungs every 4 (four) hours as needed for wheezing or shortness of breath., Disp: 1 each, Rfl: 0   amoxicillin-clavulanate (AUGMENTIN) 875-125 MG tablet, Take 1 tablet by mouth 2 (two) times daily. X 7 days, Disp: 14 tablet, Rfl: 0   fluticasone (FLONASE) 50 MCG/ACT nasal spray, Place 2 sprays into both nostrils daily., Disp: 16 g, Rfl: 0   naproxen (NAPROSYN) 500 MG tablet, Take 1 tablet (500 mg total) by mouth 2 (two) times daily., Disp: 20 tablet, Rfl: 0   predniSONE (DELTASONE) 20 MG tablet, Take 2 tablets (40 mg total) by mouth daily with breakfast for 5 days., Disp: 10 tablet, Rfl: 0   atorvastatin (LIPITOR) 20 MG tablet, Take 1 tablet (20 mg total) by mouth daily., Disp: 90 tablet, Rfl: 0   benzonatate (TESSALON) 100 MG capsule, Take 1-2 capsules (100-200 mg total) by mouth 3 (three) times daily as needed for cough., Disp: 60 capsule, Rfl: 0   busPIRone (BUSPAR) 7.5 MG tablet, Take 1 tablet (7.5 mg total) by mouth 2 (two) times daily. (Patient not taking: Reported on 01/07/2021), Disp: 60 tablet, Rfl: 1   Cholecalciferol (D3) 50 MCG (2000 UT) TABS, Take by mouth daily., Disp: , Rfl:    cholestyramine (QUESTRAN) 4 GM/DOSE powder, One scoop BID prn diarrhea (Patient taking differently: Take 4 g by mouth 2 (two) times daily as needed (diarrhea).), Disp: 378 g, Rfl: 3   clonazePAM (KLONOPIN) 0.5 MG tablet, Take 1/2 tab  p.o. qhs for insomnia., Disp: 30 tablet, Rfl: 1   cyclobenzaprine (FLEXERIL) 10 MG tablet, Take 1 tablet (10 mg total) by mouth 3 (three) times daily as needed. for muscle spams, Disp: 90 tablet, Rfl: 1   levothyroxine (SYNTHROID) 175 MCG tablet, TAKE 1 TABLET BY MOUTH ONCE DAILY BEFORE BREAKFAST, Disp: 90 tablet, Rfl: 1   lisinopril (ZESTRIL) 20 MG tablet, Take 1 tablet (20 mg total) by mouth daily., Disp: 90 tablet, Rfl: 1   metoprolol succinate (TOPROL-XL) 50 MG 24 hr tablet, Take 1 tablet by mouth daily., Disp: , Rfl:    omeprazole (PRILOSEC) 40 MG capsule, TAKE 1 CAPSULE BY MOUTH TWICE DAILY BEFORE A MEAL, Disp: 180 capsule, Rfl: 1   ondansetron (ZOFRAN-ODT) 4 MG disintegrating tablet, Take one tablet every 8 hours prn, Disp: 30 tablet, Rfl: 0   oseltamivir (TAMIFLU) 75 MG capsule, Take 1 capsule (75 mg total) by mouth 2 (two) times daily.,  Disp: 10 capsule, Rfl: 0   oxyCODONE-acetaminophen (PERCOCET) 5-325 MG tablet, Take 1 tablet by mouth 2 (two) times daily as needed for severe pain. Do Not Fill Before 12/03/2020, Disp: 60 tablet, Rfl: 0   promethazine-dextromethorphan (PROMETHAZINE-DM) 6.25-15 MG/5ML syrup, Take 5 mLs by mouth at bedtime as needed for cough., Disp: 100 mL, Rfl: 0   pseudoephedrine (SUDAFED) 30 MG tablet, Take 1 tablet (30 mg total) by mouth every 8 (eight) hours as needed for congestion., Disp: 30 tablet, Rfl: 0  Allergies  Allergen Reactions   Bupivacaine Swelling    Pt received CT contrast and Bupivacaine at the same time. Unsure which one caused the reaction. Dr. Mosetta Putt recommends a 13 hour prep before either one   Contrast Media [Iodinated Diagnostic Agents] Swelling    Pt received CT contrast and Bupivacaine at the same time. Unsure which one caused the reaction. Dr. Mosetta Putt recommends a 13 hour prep before either one   Indomethacin Other (See Comments)    Dizziness, near syncope, bp went up and BG went down.   Adhesive [Tape] Other (See Comments)    Skin irritation,  patient prefers paper tape.    Celexa [Citalopram Hydrobromide] Other (See Comments)    Make patient too carefree     ROS  As noted in HPI.   Physical Exam  BP 130/85   Pulse 71   Temp 98.6 F (37 C) (Oral)   Resp 16   LMP 02/25/2016   SpO2 99%   Constitutional: Well developed, well nourished, no acute distress Eyes:  EOMI, conjunctiva normal bilaterally HENT: Normocephalic, atraumatic,mucus membranes moist purulent nasal discharge.  Erythematous, swollen turbinates.  No maxillary, frontal sinus tenderness.  TMs normal bilaterally.  Tenderness at the left TMJ joint.  No pain with traction on pinna, palpation of tragus or mastoid left ear.  Left external ear, EAC normal.  No obvious postnasal drip Respiratory: Normal inspiratory effort, lungs clear bilaterally Cardiovascular: Normal rate GI: nondistended skin: No rash, skin intact Musculoskeletal: no deformities Neurologic: Alert & oriented x 3, no focal neuro deficits Psychiatric: Speech and behavior appropriate   ED Course   Medications - No data to display  No orders of the defined types were placed in this encounter.   No results found for this or any previous visit (from the past 24 hour(s)). No results found.  ED Clinical Impression  1. Acute non-recurrent pansinusitis   2. Otalgia, left   3. Arthralgia of left temporomandibular joint      ED Assessment/Plan  Concern for secondary sinusitis secondary to recent influenza/URI.  She has had symptoms for over 10 days.  She does not have an otitis media.  Sending home with Augmentin, Mucinex, saline nasal irrigation, prednisone 40 mg for 5.  Patient states that she cannot do Flonase secondary to hypothyroidism.  Refilling her albuterol because of the wheezing.  She does not need a spacer.  Advised her to take 2 puffs of this every 4-6 hours as needed.    2.  Otalgia.  Suspect TMJ arthralgia.  Naprosyn/Tylenol, soft diet, Flexeril at night.  She does not need a  prescription for this.  Follow-up with PMD as needed.  Discussed MDM, treatment plan, and plan for follow-up with patient. . patient agrees with plan.   Meds ordered this encounter  Medications   fluticasone (FLONASE) 50 MCG/ACT nasal spray    Sig: Place 2 sprays into both nostrils daily.    Dispense:  16 g    Refill:  0   naproxen (NAPROSYN) 500 MG tablet    Sig: Take 1 tablet (500 mg total) by mouth 2 (two) times daily.    Dispense:  20 tablet    Refill:  0   albuterol (VENTOLIN HFA) 108 (90 Base) MCG/ACT inhaler    Sig: Inhale 1-2 puffs into the lungs every 4 (four) hours as needed for wheezing or shortness of breath.    Dispense:  1 each    Refill:  0   predniSONE (DELTASONE) 20 MG tablet    Sig: Take 2 tablets (40 mg total) by mouth daily with breakfast for 5 days.    Dispense:  10 tablet    Refill:  0   amoxicillin-clavulanate (AUGMENTIN) 875-125 MG tablet    Sig: Take 1 tablet by mouth 2 (two) times daily. X 7 days    Dispense:  14 tablet    Refill:  0      *This clinic note was created using Scientist, clinical (histocompatibility and immunogenetics). Therefore, there may be occasional mistakes despite careful proofreading.  ?    Domenick Gong, MD 02/04/21 [id number]

## 2021-02-03 NOTE — ED Triage Notes (Signed)
PT was seen here 10 days ago and diagnosed with flu. Reports ongoing cough and worsening facial / nasal congestion. Left ear bothering her as well. Requests abx

## 2021-02-23 ENCOUNTER — Encounter: Payer: Self-pay | Admitting: Physical Medicine and Rehabilitation

## 2021-02-28 ENCOUNTER — Ambulatory Visit (INDEPENDENT_AMBULATORY_CARE_PROVIDER_SITE_OTHER): Payer: 59 | Admitting: Family Medicine

## 2021-02-28 ENCOUNTER — Encounter: Payer: Self-pay | Admitting: Family Medicine

## 2021-02-28 ENCOUNTER — Other Ambulatory Visit: Payer: Self-pay

## 2021-02-28 ENCOUNTER — Encounter: Payer: 59 | Attending: Registered Nurse | Admitting: Physical Medicine and Rehabilitation

## 2021-02-28 VITALS — BP 132/90 | HR 92 | Temp 97.9°F | Ht 67.0 in | Wt 245.6 lb

## 2021-02-28 DIAGNOSIS — G43109 Migraine with aura, not intractable, without status migrainosus: Secondary | ICD-10-CM

## 2021-02-28 DIAGNOSIS — G894 Chronic pain syndrome: Secondary | ICD-10-CM | POA: Insufficient documentation

## 2021-02-28 DIAGNOSIS — Z79891 Long term (current) use of opiate analgesic: Secondary | ICD-10-CM | POA: Insufficient documentation

## 2021-02-28 DIAGNOSIS — Z5181 Encounter for therapeutic drug level monitoring: Secondary | ICD-10-CM | POA: Insufficient documentation

## 2021-02-28 DIAGNOSIS — M5416 Radiculopathy, lumbar region: Secondary | ICD-10-CM | POA: Insufficient documentation

## 2021-02-28 DIAGNOSIS — F411 Generalized anxiety disorder: Secondary | ICD-10-CM | POA: Diagnosis not present

## 2021-02-28 DIAGNOSIS — Z981 Arthrodesis status: Secondary | ICD-10-CM | POA: Insufficient documentation

## 2021-02-28 MED ORDER — SUMATRIPTAN SUCCINATE 100 MG PO TABS: ORAL_TABLET | ORAL | 1 refills | Status: DC

## 2021-02-28 NOTE — Assessment & Plan Note (Signed)
Improving at this time.  May use BuSpar if needed.  Patient will continue her current medications.

## 2021-02-28 NOTE — Progress Notes (Signed)
Subjective:  Patient ID: Teresa Vincent, female    DOB: 26-Oct-1980  Age: 39 y.o. MRN: [id number]  CC: Chief Complaint  Patient presents with   Anxiety    Pt has not taken Buspar due to potential to cause dizziness. Pt had 4 migraines last week from Tuesday-Friday. Meloxicam(not on current med list but pt states was prescribed) is not helping    HPI:  39 year old female presents for follow-up regarding anxiety.  Patient states that her anxiety has been slightly improved following changes in her lifestyle/at home.  She is trying to distract her self more and to keep her self occupied.  She is trying to eat healthier.  She has noticed an improvement in her anxiety.  GAD-7 -3 today.  She has not started the BuSpar as she states that she is concerned that it may make her dizzy.  Additionally, patient states that she has recently been experiencing frequent migraines.  This is not typical for her.  She states that last week she had several migraines, from Thursday to Friday.  She states that the migraines were severe.  She states that she has preceding aura with visual disturbance.  No associated nausea.  Patient states that she got relief with ibuprofen and her home pain medication.  She is not on any medication for migraine prophylaxis.  Patient Active Problem List   Diagnosis Date Noted   Chronic pain syndrome 02/28/2021   Hemorrhoids, internal 07/15/2020   Nephrolithiasis 04/23/2019   S/P lumbar fusion 02/28/2018   Prediabetes 12/04/2017   S/P lumbar spinal fusion 05/29/2017   Mixed hyperlipidemia 10/11/2016   Class 2 severe obesity due to excess calories with serious comorbidity and body mass index (BMI) of 36.0 to 36.9 in adult [facility]) 06/10/2015   Essential hypertension, benign 12/30/2012   Esophageal reflux 12/30/2012   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   Anxiety state 08/30/2007   Migraine headache 08/30/2007   Insomnia 08/30/2007    Social Hx   Social  History   Socioeconomic History   Marital status: Married    Spouse name: Not on file   Number of children: Not on file   Years of education: Not on file   Highest education level: Not on file  Occupational History   Occupation: Doesn't work outside the home  Tobacco Use   Smoking status: Every Day    Packs/day: 1.00    Years: 26.00    Pack years: 26.00    Types: Cigarettes    Last attempt to quit: 06/19/2019    Years since quitting: 1.6   Smokeless tobacco: Former  Scientific laboratory technician Use: Every day   Substances: Nicotine, Flavoring  Substance and Sexual Activity   Alcohol use: Yes    Alcohol/week: 0.0 standard drinks    Comment: occasional   Drug use: Not Currently    Types: Marijuana    Comment: occasional   Sexual activity: Not Currently    Birth control/protection: Surgical  Other Topics Concern   Not on file  Social History Narrative   Not on file   Social Determinants of Health   Financial Resource Strain: Not on file  Food Insecurity: Not on file  Transportation Needs: Not on file  Physical Activity: Not on file  Stress: Not on file  Social Connections: Not on file    Review of Systems Per HPI  Objective:  BP 132/90   Pulse 92   Temp 97.9 F (36.6 C)   Ht 5\' 7"  (  1.702 m)   Wt 245 lb 9.6 oz (111.4 kg)   LMP 02/25/2016   SpO2 97%   BMI 38.47 kg/m   BP/Weight 02/28/2021 02/03/2021 01/24/2021  Systolic BP 132 130 146  Diastolic BP 90 85 86  Wt. (Lbs) 245.6 - -  BMI 38.47 - -    Physical Exam Vitals and nursing note reviewed.  Constitutional:      General: She is not in acute distress.    Appearance: Normal appearance. She is obese. She is not ill-appearing.  Cardiovascular:     Rate and Rhythm: Normal rate and regular rhythm.  Pulmonary:     Effort: Pulmonary effort is normal.     Breath sounds: Normal breath sounds. No wheezing, rhonchi or rales.  Neurological:     Mental Status: She is alert.  Psychiatric:        Mood and Affect:  Mood normal.        Behavior: Behavior normal.    Lab Results  Component Value Date   WBC 8.7 12/28/2020   HGB 14.6 12/28/2020   HCT 43.5 12/28/2020   PLT 250 12/28/2020   GLUCOSE 101 (H) 12/28/2020   CHOL 167 04/23/2020   TRIG 214 (H) 04/23/2020   HDL 38 (L) 04/23/2020   LDLCALC 98 04/23/2020   ALT 13 12/28/2020   AST 15 12/28/2020   NA 140 12/28/2020   K 5.2 12/28/2020   CL 102 12/28/2020   CREATININE 0.81 12/28/2020   BUN 11 12/28/2020   CO2 23 12/28/2020   TSH 15.06 (H) 10/22/2020   INR 0.89 02/20/2018   HGBA1C 6.0 10/27/2020     Assessment & Plan:   Problem List Items Addressed This Visit       Cardiovascular and Mediastinum   Migraine headache - Primary    Migraines are uncontrolled/worsening at this time.  Placing on Imitrex.      Relevant Medications   SUMAtriptan (IMITREX) 100 MG tablet     Other   Anxiety state    Improving at this time.  May use BuSpar if needed.  Patient will continue her current medications.       Meds ordered this encounter  Medications   SUMAtriptan (IMITREX) 100 MG tablet    Sig: 1 tablet at the first sign of migraine. Additional dose in 2 hours if headache persists or recurs.    Dispense:  10 tablet    Refill:  1    Follow-up:  Return in about 6 months (around 08/29/2021).  Everlene Other DO [facility]

## 2021-02-28 NOTE — Patient Instructions (Signed)
Imitrex if needed for Migraine.  Consider the buspar.  Follow up in 6 months.  Take care  Dr. Adriana Simas

## 2021-02-28 NOTE — Progress Notes (Signed)
Subjective:    Patient ID: Teresa Vincent, female    DOB: 06/07/1980, 40 y.o.   MRN: [id number]  HPI  An audio/video tele-health visit is felt to be the most appropriate encounter for this patient at this time. This is a follow up tele-visit via [facility]. The patient is at home. MD is at office.     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  -Pain has been present for years -She has had two back surgeries.  -She has had 7 injections but got no relief from these.  -She has gone back to Dr. Yetta Barre who did her surgery.  -She was on oxycodone 5mg  that she broke in half to 2.5s which helped. This helped her to get through the day. She has been on it for years. She has been following with .  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  -feels better when laying down, but also painful while sitting -today has been a bad day -she uses her electric blanket and heating pad -she has been working at [employer]- she is the cold at 5am in the morning and this is when the pain is worse.  -the colder it is the worse it is hurting  2) left knee pain: -has had steroid injection with benefit.  -this has continued to benefit.   3) left foot pain -prescribed meloxicam by Dr. [employer]  4) Anxiety -she gets anxious about driving and she gets palpitations -she has had a prior stroke and has felt this anxiety since this time.   5) migraines -has at baseline but last week was very severe, particularly Wednesday -she considered going to the ED on Wednesday due to her pain but pain improved without new intervention by Thursday -she saw her PCP this morning and was recommended to take Imitrex -she does have a history of remote stroke which was thought to be  related to her thyroid disease   Pain Inventory Average Pain 8 Pain Right Now 8 My pain is sharp, burning, dull, stabbing, tingling and aching  In the last 24 hours, has pain interfered with the following? General activity 5 Relation with others 5 Enjoyment of life 8 What TIME of day is your pain at its worst? morning , daytime, evening and night Sleep (in general) Poor  Pain is worse with: walking, bending, sitting, inactivity, standing and some activites Pain improves with: medication Relief from Meds: 7     Family History  Problem Relation Age of Onset   Hypertension Mother    Thyroid disease Neg Hx    Social History   Socioeconomic History   Marital status: Married    Spouse name: Not on file   Number of children: Not on file   Years of education: Not on file   Highest education level: Not on file  Occupational History   Occupation: Doesn't work outside the home  Tobacco Use   Smoking status: Every Day    Packs/day: 1.00    Years: 26.00    Pack years: 26.00    Types: Cigarettes    Last attempt to quit: 06/19/2019    Years since quitting: 1.6   Smokeless tobacco: Former  08/19/2019 Use: Every day  Substances: Nicotine, Flavoring  Substance and Sexual Activity   Alcohol use: Yes    Alcohol/week: 0.0 standard drinks    Comment: occasional   Drug use: Not Currently    Types: Marijuana    Comment: occasional   Sexual activity: Not Currently    Birth control/protection: Surgical  Other Topics Concern   Not on file  Social History Narrative   Not on file   Social Determinants of Health   Financial Resource Strain: Not on file  Food Insecurity: Not on file  Transportation Needs: Not on file  Physical Activity: Not on file  Stress: Not on file  Social Connections: Not on file   Past Surgical History:  Procedure Laterality Date   ABDOMINAL EXPOSURE N/A 05/29/2017   Procedure: ABDOMINAL EXPOSURE;  Surgeon: Angelia Mould, MD;   Location: [place];  Service: Vascular;  Laterality: N/A;   ABDOMINAL HYSTERECTOMY     ANTERIOR LUMBAR FUSION N/A 05/29/2017   Procedure: Anterior Lumbar Interbody Fusion  - Lumbar five sacral one;  Surgeon: Eustace Moore, MD;  Location: [place];  Service: Neurosurgery;  Laterality: N/A;   BIOPSY  02/17/2020   Procedure: BIOPSY;  Surgeon: Eloise Harman, DO;  [facility];  Service: Endoscopy;;  gastric   CARPAL TUNNEL RELEASE Bilateral    CHOLECYSTECTOMY     CHOLECYSTECTOMY N/A 12/29/2016   Procedure: LAPAROSCOPIC CHOLECYSTECTOMY;  Surgeon: Virl Cagey, MD;  Location: [facility];  Service: General;  Laterality: N/A;   COLONOSCOPY WITH PROPOFOL N/A 02/17/2020   non-bleeding internal hemorrhoids, otherwise normal.   CYSTOSCOPY/URETEROSCOPY/HOLMIUM LASER/STENT PLACEMENT Right 06/25/2019   Procedure: CYSTOSCOPY WITH RIGHT RETROGRADE PYELOGRAM/RIGHT URETEROSCOPY/HOLMIUM LASER APPLICATION RIGHT URETERAL CALCULUS/RIGHT URETERAL STENT PLACEMENT;  Surgeon: Cleon Gustin, MD;  Location: [facility];  Service: Urology;  Laterality: Right;   ESOPHAGOGASTRODUODENOSCOPY (EGD) WITH PROPOFOL N/A 02/17/2020   Procedure: ESOPHAGOGASTRODUODENOSCOPY (EGD) WITH PROPOFOL;  Surgeon: Eloise Harman, DO;  [facility];  Service: Endoscopy;  Laterality: N/A;   EXTRACORPOREAL SHOCK WAVE LITHOTRIPSY Right 04/10/2019   Procedure: EXTRACORPOREAL SHOCK WAVE LITHOTRIPSY (ESWL);  Surgeon: Robley Fries, MD;  [facility];  Service: Urology;  Laterality: Right;   LAMINECTOMY WITH POSTERIOR LATERAL ARTHRODESIS LEVEL 1 N/A 02/28/2018   Procedure: Laminectomy and Foraminotomy Lumbar Five-Sacral One, posterolateral fusion and fixation;  Surgeon: Eustace Moore, MD;  Location: [place];  Service: Neurosurgery;  Laterality: N/A;  posterior   LUMBAR EPIDURAL INJECTION     STERIOD INJECTION N/A 01/10/2017   Procedure: MINOR EXPAREL INJECTION;  Surgeon: Virl Cagey, MD;  [facility];  Service: General;  Laterality: N/A;   TOTAL ABDOMINAL HYSTERECTOMY W/ BILATERAL SALPINGOOPHORECTOMY N/A    TUBAL LIGATION  2003   Past Medical History:  Diagnosis Date   Abnormal Pap smear of cervix    ANXIETY 08/30/2007   Asthma    Chronic back pain    Claustrophobia    pt requests Versed before surgery.   Complication of anesthesia    hx of waking up during anesthesia   GERD (gastroesophageal reflux disease)    History of kidney stones    Hypertension    HYPOTHYROIDISM, POST-RADIATION 02/08/2009   INSOMNIA 08/30/2007   MIGRAINE HEADACHE 08/30/2007   Neck pain    Palpitations    occasionally couple times a week   Sciatica    Sleep apnea    cpap   Stroke ([place]) 2010   states, no deficits and on no meds now.  LMP 02/25/2016   Opioid Risk Score:   Fall Risk Score:  `1  Depression screen PHQ 2/9  Depression screen [facility] 2/9 01/07/2021 12/20/2020 10/12/2020 09/10/2020 07/21/2020 06/23/2020 05/27/2020  Decreased Interest 0 0 0 0 0 3 0  Down, Depressed, Hopeless 0 0 0 0 0 0 0  PHQ - 2 Score 0 0 0 0 0 3 0  Altered sleeping - - - 3 - 3 3  Tired, decreased energy - - - 1 - 1 1  Change in appetite - - - 0 - 2 3  Feeling bad or failure about yourself  - - - 0 - 0 0  Trouble concentrating - - - 0 - 0 0  Moving slowly or fidgety/restless - - - 0 - 0 0  Suicidal thoughts - - - 0 - 0 0  PHQ-9 Score - - - 4 - 9 7  Difficult doing work/chores - - - Not difficult at all - Somewhat difficult -  Some recent data might be hidden    Review of Systems  Constitutional:  Positive for unexpected weight change.  HENT: Negative.    Eyes: Negative.   Respiratory: Negative.    Cardiovascular:  Positive for leg swelling.  Gastrointestinal: Negative.   Endocrine: Negative.   Genitourinary: Negative.   Musculoskeletal:  Positive for arthralgias and back pain.  Skin: Negative.   Allergic/Immunologic: Negative.   Neurological:  Positive for numbness.       Tingling  Hematological:  Negative.   Psychiatric/Behavioral:  The patient is nervous/anxious.   All other systems reviewed and are negative.     Objective:   Physical Exam Not performed as patient was seen via phone    Assessment & Plan:  Mrs. Pichardo is a 40 year old woman who presents to establish care for lumbar spinal stenosis.  1) Lumbar spinal stenosis with lumbar neurogenic claudication.  -Discussed benefits of exercise in reducing pain. -She is allergic to bupivicaine, she has tried surgery, injections, medication. Oxycodone 5mg  helped. She has never failed a drug screen. She works at [employer]. Increase oxycodone to 5mg  daily.  -Will obtain UDS and pain contract today. If results negative, can prescribe Percocet 5mg -325 monthly PRN.  Lumbar MRI reviewed as follows:  IMPRESSION: 1. No impingement identified in the lumbar spine. 2. 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 3. Degenerative disc disease at T11-12, L2- 3, L3-4, and L5-S1. 4. Cholelithiasis.  -Discussed following foods that may reduce pain: 1) Ginger (especially studied for arthritis)- reduce leukotriene production to decrease inflammation 2) Blueberries- high in phytonutrients that decrease inflammation 3) Salmon- marine omega-3s reduce joint swelling and pain 4) Pumpkin seeds- reduce inflammation 5) dark chocolate- reduces inflammation 6) turmeric- reduces inflammation 7) tart cherries - reduce pain and stiffness 8) extra virgin olive oil - its compound olecanthal helps to block prostaglandins  9) chili peppers- can be eaten or applied topically via capsaicin 10) mint- helpful for headache, muscle aches, joint pain, and itching 11) garlic- reduces inflammation  Link to further information on diet for chronic pain: [email or web address]    2) Obesity: weight 246 lbs, BMI 38.37.  -Educated regarding health benefits of weight loss- for  pain, general health, chronic disease prevention, immune health, mental health.  -Will monitor weight every visit.  Recommended [place] tea daily.  -Discussed foods that can assist in weight loss: 1) leafy greens- high in fiber and nutrients 2) dark chocolate- improves metabolism (if prefer sweetened, best to sweeten with  honey instead of sugar).  3) cruciferous vegetables- high in fiber and protein 4) full fat yogurt: high in healthy fat, protein, calcium, and probiotics 5) apples- high in a variety of phytochemicals 6) nuts- high in fiber and protein that increase feelings of fullness 7) grapefruit: rich in nutrients, antioxidants, and fiber (not to be taken with anticoagulation) 8) beans- high in protein and fiber 9) salmon- has high quality protein and healthy fats 10) green tea- rich in polyphenols 11) eggs- rich in choline and vitamin D 12) tuna- high protein, boosts metabolism 13) avocado- decreases visceral abdominal fat 14) chicken (pasture raised): high in protein and iron 15) blueberries- reduce abdominal fat and cholesterol 16) whole grains- decreases calories retained during digestion, speeds metabolism 17) chia seeds- curb appetite 18) chilies- increases fat metabolism    3) Anxiety: -Discussed exercise and meditation as tools to decrease anxiety. -Recommended Down Dog Yoga app -Discussed spending time outdoors. -Discussed positive re-framing of anxiety.  -Discussed neuropsych follow-up- she will consider.  -Discussed the following foods that have been show to reduce anxiety: 1) [place] nuts, mushrooms, soy beans due to their high selenium content. Upper limit of toxicity of selenium is 423mcg/day so no more than 3-4 [place] nuts per day.  2) Fatty fish such as salmon, mackerel, sardines, trout, and herring- high in omega-3 fatty acids 3) Eggs- increases serotonin and dopamine 4) Pumpkin seeds- high in omega-3 fatty acids 5) dark chocolate- high in flavanols that  increase blood flow to brain 6) turmeric- take with black pepper to increase absorption 7) chamomile tea- antioxidant and anti-inflammatory properties 8) yogurt without sugar- supports gut-brain axis 9) green tea- contains L- theanine 10) blueberries- high in vitamin C and antioxidants 11) [place]- high in tryptophan which gets converted to serotonin 12) bell peppers- rich in vitamin C and antioxidants 13) citrus fruits- rich in vitamin C and antioxidants 14) almonds- high in vitamin E and healthy fats 15) chia seeds- high in omega-3 fatty acids 16) saffron  4) migraines -agree with PCP that imitrex is a great option. Discussed with her that there is increased risk of stroke. She states thyroid function is currently well controlled -discussed any potential triggers- she notes new perfume last week which could have been a trigger -advised to continue to be attentive for other food or chemical related triggers  7 minutes spent in discussion of her migraines last week, discussion of trial of imitrex and increased risk of stroke, migraine triggers, other medication options if she does not find relief from imitrex

## 2021-02-28 NOTE — Assessment & Plan Note (Signed)
Migraines are uncontrolled/worsening at this time.  Placing on Imitrex.

## 2021-03-04 ENCOUNTER — Encounter: Payer: Self-pay | Admitting: Registered Nurse

## 2021-03-04 ENCOUNTER — Encounter (HOSPITAL_BASED_OUTPATIENT_CLINIC_OR_DEPARTMENT_OTHER): Payer: 59 | Admitting: Registered Nurse

## 2021-03-04 ENCOUNTER — Other Ambulatory Visit: Payer: Self-pay

## 2021-03-04 VITALS — BP 110/73 | HR 62 | Temp 98.9°F | Ht 67.0 in | Wt 248.0 lb

## 2021-03-04 DIAGNOSIS — Z981 Arthrodesis status: Secondary | ICD-10-CM

## 2021-03-04 DIAGNOSIS — M5416 Radiculopathy, lumbar region: Secondary | ICD-10-CM

## 2021-03-04 DIAGNOSIS — Z5181 Encounter for therapeutic drug level monitoring: Secondary | ICD-10-CM | POA: Diagnosis present

## 2021-03-04 DIAGNOSIS — G43109 Migraine with aura, not intractable, without status migrainosus: Secondary | ICD-10-CM

## 2021-03-04 DIAGNOSIS — Z79891 Long term (current) use of opiate analgesic: Secondary | ICD-10-CM

## 2021-03-04 DIAGNOSIS — G894 Chronic pain syndrome: Secondary | ICD-10-CM

## 2021-03-04 NOTE — Progress Notes (Signed)
Subjective:    Patient ID: Teresa Vincent, female    DOB: 1981/02/24, 40 y.o.   MRN: [id number]  HPI: Teresa Vincent is a 40 y.o. female who returns for follow up appointment for chronic pain and medication refill. She states her pain is located in her lower back. She rates her pain 9. Her current exercise regime is walking and performing stretching exercises.  Teresa Vincent Morphine equivalent is 10.00 MME.   Teresa Vincent uses her oxycodonecodone sparingly.Last UDS was Performed on 10/12/2020, it was consistent.      Pain Inventory Average Pain 9 Pain Right Now 9 My pain is sharp, burning, dull, stabbing, tingling, and aching  In the last 24 hours, has pain interfered with the following? General activity 7 Relation with others 3 Enjoyment of life 8 What TIME of day is your pain at its worst? morning , daytime, evening, and night Sleep (in general) Poor  Pain is worse with: walking, inactivity, standing, and some activites Pain improves with: therapy/exercise Relief from Meds: 8  Family History  Problem Relation Age of Onset   Hypertension Mother    Thyroid disease Neg Hx    Social History   Socioeconomic History   Marital status: Married    Spouse name: Not on file   Number of children: Not on file   Years of education: Not on file   Highest education level: Not on file  Occupational History   Occupation: Doesn't work outside the home  Tobacco Use   Smoking status: Every Day    Packs/day: 1.00    Years: 26.00    Pack years: 26.00    Types: Cigarettes    Last attempt to quit: 06/19/2019    Years since quitting: 1.7   Smokeless tobacco: Former  Building services engineer Use: Every day   Substances: Nicotine, Flavoring  Substance and Sexual Activity   Alcohol use: Yes    Alcohol/week: 0.0 standard drinks    Comment: occasional   Drug use: Not Currently    Types: Marijuana    Comment: occasional   Sexual activity: Not Currently    Birth  control/protection: Surgical  Other Topics Concern   Not on file  Social History Narrative   Not on file   Social Determinants of Health   Financial Resource Strain: Not on file  Food Insecurity: Not on file  Transportation Needs: Not on file  Physical Activity: Not on file  Stress: Not on file  Social Connections: Not on file   Past Surgical History:  Procedure Laterality Date   ABDOMINAL EXPOSURE N/A 05/29/2017   Procedure: ABDOMINAL EXPOSURE;  Surgeon: Chuck Hint, MD;  Location: [facility] OR;  Service: Vascular;  Laterality: N/A;   ABDOMINAL HYSTERECTOMY     ANTERIOR LUMBAR FUSION N/A 05/29/2017   Procedure: Anterior Lumbar Interbody Fusion  - Lumbar five sacral one;  Surgeon: Tia Alert, MD;  Location: [facility] OR;  Service: Neurosurgery;  Laterality: N/A;   BIOPSY  02/17/2020   Procedure: BIOPSY;  Surgeon: Lanelle Bal, DO;  [facility];  Service: Endoscopy;;  gastric   CARPAL TUNNEL RELEASE Bilateral    CHOLECYSTECTOMY     CHOLECYSTECTOMY N/A 12/29/2016   Procedure: LAPAROSCOPIC CHOLECYSTECTOMY;  Surgeon: [name], MD;  Location: [facility];  Service: General;  Laterality: N/A;   COLONOSCOPY WITH PROPOFOL N/A 02/17/2020   non-bleeding internal hemorrhoids, otherwise normal.   CYSTOSCOPY/URETEROSCOPY/HOLMIUM LASER/STENT PLACEMENT Right 06/25/2019   Procedure: CYSTOSCOPY WITH RIGHT  RETROGRADE PYELOGRAM/RIGHT URETEROSCOPY/HOLMIUM LASER APPLICATION RIGHT URETERAL CALCULUS/RIGHT URETERAL STENT PLACEMENT;  Surgeon: Malen Gauze, MD;  Location: [facility];  Service: Urology;  Laterality: Right;   ESOPHAGOGASTRODUODENOSCOPY (EGD) WITH PROPOFOL N/A 02/17/2020   Procedure: ESOPHAGOGASTRODUODENOSCOPY (EGD) WITH PROPOFOL;  Surgeon: Lanelle Bal, DO;  [facility];  Service: Endoscopy;  Laterality: N/A;   EXTRACORPOREAL SHOCK WAVE LITHOTRIPSY Right 04/10/2019   Procedure: EXTRACORPOREAL SHOCK WAVE LITHOTRIPSY (ESWL);  Surgeon: Noel Christmas, MD;   Location: [facility];  Service: Urology;  Laterality: Right;   LAMINECTOMY WITH POSTERIOR LATERAL ARTHRODESIS LEVEL 1 N/A 02/28/2018   Procedure: Laminectomy and Foraminotomy Lumbar Five-Sacral One, posterolateral fusion and fixation;  Surgeon: Tia Alert, MD;  Location: [facility] OR;  Service: Neurosurgery;  Laterality: N/A;  posterior   LUMBAR EPIDURAL INJECTION     STERIOD INJECTION N/A 01/10/2017   Procedure: MINOR EXPAREL INJECTION;  Surgeon: [name], MD;  Location: [facility];  Service: General;  Laterality: N/A;   TOTAL ABDOMINAL HYSTERECTOMY W/ BILATERAL SALPINGOOPHORECTOMY N/A    TUBAL LIGATION  2003   Past Surgical History:  Procedure Laterality Date   ABDOMINAL EXPOSURE N/A 05/29/2017   Procedure: ABDOMINAL EXPOSURE;  Surgeon: Chuck Hint, MD;  Location: [facility] OR;  Service: Vascular;  Laterality: N/A;   ABDOMINAL HYSTERECTOMY     ANTERIOR LUMBAR FUSION N/A 05/29/2017   Procedure: Anterior Lumbar Interbody Fusion  - Lumbar five sacral one;  Surgeon: Tia Alert, MD;  Location: [facility] OR;  Service: Neurosurgery;  Laterality: N/A;   BIOPSY  02/17/2020   Procedure: BIOPSY;  Surgeon: Lanelle Bal, DO;  [facility];  Service: Endoscopy;;  gastric   CARPAL TUNNEL RELEASE Bilateral    CHOLECYSTECTOMY     CHOLECYSTECTOMY N/A 12/29/2016   Procedure: LAPAROSCOPIC CHOLECYSTECTOMY;  Surgeon: [name], MD;  [facility];  Service: General;  Laterality: N/A;   COLONOSCOPY WITH PROPOFOL N/A 02/17/2020   non-bleeding internal hemorrhoids, otherwise normal.   CYSTOSCOPY/URETEROSCOPY/HOLMIUM LASER/STENT PLACEMENT Right 06/25/2019   Procedure: CYSTOSCOPY WITH RIGHT RETROGRADE PYELOGRAM/RIGHT URETEROSCOPY/HOLMIUM LASER APPLICATION RIGHT URETERAL CALCULUS/RIGHT URETERAL STENT PLACEMENT;  Surgeon: Malen Gauze, MD;  Location: [facility];  Service: Urology;  Laterality: Right;   ESOPHAGOGASTRODUODENOSCOPY (EGD) WITH PROPOFOL N/A 02/17/2020    Procedure: ESOPHAGOGASTRODUODENOSCOPY (EGD) WITH PROPOFOL;  Surgeon: Lanelle Bal, DO;  [facility];  Service: Endoscopy;  Laterality: N/A;   EXTRACORPOREAL SHOCK WAVE LITHOTRIPSY Right 04/10/2019   Procedure: EXTRACORPOREAL SHOCK WAVE LITHOTRIPSY (ESWL);  Surgeon: Noel Christmas, MD;  Location: [facility];  Service: Urology;  Laterality: Right;   LAMINECTOMY WITH POSTERIOR LATERAL ARTHRODESIS LEVEL 1 N/A 02/28/2018   Procedure: Laminectomy and Foraminotomy Lumbar Five-Sacral One, posterolateral fusion and fixation;  Surgeon: Tia Alert, MD;  Location: [facility] OR;  Service: Neurosurgery;  Laterality: N/A;  posterior   LUMBAR EPIDURAL INJECTION     STERIOD INJECTION N/A 01/10/2017   Procedure: MINOR EXPAREL INJECTION;  Surgeon: [name], MD;  [facility];  Service: General;  Laterality: N/A;   TOTAL ABDOMINAL HYSTERECTOMY W/ BILATERAL SALPINGOOPHORECTOMY N/A    TUBAL LIGATION  2003   Past Medical History:  Diagnosis Date   Abnormal Pap smear of cervix    ANXIETY 08/30/2007   Asthma    Chronic back pain    Claustrophobia    pt requests Versed before surgery.   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   Ht 5\' 7"  (1.702 m)    Wt 248 lb (112.5 kg)    LMP 02/25/2016    BMI 38.84 kg/m   Opioid Risk Score:   Fall Risk Score:  `1  Depression screen PHQ 2/9  Depression screen [facility] 2/9 03/04/2021 01/07/2021 12/20/2020 10/12/2020 09/10/2020 07/21/2020 06/23/2020  Decreased Interest 0 0 0 0 0 0 3  Down, Depressed, Hopeless 0 0 0 0 0 0 0  PHQ - 2 Score 0 0 0 0 0 0 3  Altered sleeping -  - - - 3 - 3  Tired, decreased energy - - - - 1 - 1  Change in appetite - - - - 0 - 2  Feeling bad or failure about yourself  - - - - 0 - 0  Trouble concentrating - - - - 0 - 0  Moving slowly or fidgety/restless - - - - 0 - 0  Suicidal thoughts - - - - 0 - 0  PHQ-9 Score - - - - 4 - 9  Difficult doing work/chores - - - - Not difficult at all - Somewhat difficult  Some recent data might be hidden     Review of Systems  Constitutional: Negative.   HENT: Negative.    Eyes: Negative.   Respiratory: Negative.    Cardiovascular: Negative.   Gastrointestinal: Negative.   Endocrine: Negative.   Genitourinary: Negative.   Musculoskeletal:  Positive for back pain.  Skin: Negative.   Allergic/Immunologic: Negative.   Neurological: Negative.   Hematological: Negative.   Psychiatric/Behavioral:  Positive for sleep disturbance.       Objective:   Physical Exam Vitals and nursing note reviewed.  Constitutional:      Appearance: Normal appearance.  Cardiovascular:     Rate and Rhythm: Normal rate and regular rhythm.     Pulses: Normal pulses.     Heart sounds: Normal heart sounds.  Pulmonary:     Effort: Pulmonary effort is normal.     Breath sounds: Normal breath sounds.  Musculoskeletal:     Cervical back: Normal range of motion and neck supple.     Comments: Normal Muscle Bulk and Muscle Testing Reveals:  Upper Extremities: Full ROM and Muscle Strength 5/5  Lumbar Paraspinal Tenderness: L-3-L-5 Lower Extremities: Full ROM and Muscle Strength 5/5 Arises from Table with ease Narrow Based  Gait     Skin:    General: Skin is warm and dry.  Neurological:     Mental Status: She is alert and oriented to person, place, and time.  Psychiatric:        Mood and Affect: Mood normal.        Behavior: Behavior normal.         Assessment & Plan:  1. S/P Lumbar Spinal Fusion: S/P Laminectomy and Foraminotomy Lumbar Five-Sacral One, posterolateral fusion and fixation by Dr 08/23/2020 on  02/28/2018. Continue HEP as Tolerated. Continue to Monitor. 03/03/2021 2. Left Lumbar Radiculitis.  Continue current medication regimen. Continue to Monitor. 03/03/2021 3. Chronic Pain Syndrome: No script given. Continue Oxycodone 5 mg/325 mg  daily as needed for pain #30. We will continue the opioid monitoring program, this consists of regular clinic visits, examinations, urine drug screen, pill counts as well as use of [place] Controlled Substance Reporting system. A 12 month History has been reviewed on the [place] Controlled Substance Reporting System on 03/03/2021     F/U in 2 months

## 2021-03-19 ENCOUNTER — Other Ambulatory Visit: Payer: Self-pay | Admitting: Gastroenterology

## 2021-04-25 LAB — TSH: TSH: 9.41 mIU/L — ABNORMAL HIGH

## 2021-04-25 LAB — T4, FREE: Free T4: 1.3 ng/dL (ref 0.8–1.8)

## 2021-05-02 ENCOUNTER — Other Ambulatory Visit: Payer: Self-pay

## 2021-05-02 ENCOUNTER — Encounter: Payer: Self-pay | Admitting: "Endocrinology

## 2021-05-02 ENCOUNTER — Ambulatory Visit: Payer: 59 | Admitting: "Endocrinology

## 2021-05-02 ENCOUNTER — Ambulatory Visit (INDEPENDENT_AMBULATORY_CARE_PROVIDER_SITE_OTHER): Payer: Self-pay | Admitting: "Endocrinology

## 2021-05-02 VITALS — BP 104/60 | HR 76 | Ht 67.0 in | Wt 250.0 lb

## 2021-05-02 DIAGNOSIS — E782 Mixed hyperlipidemia: Secondary | ICD-10-CM

## 2021-05-02 DIAGNOSIS — E890 Postprocedural hypothyroidism: Secondary | ICD-10-CM

## 2021-05-02 DIAGNOSIS — R7303 Prediabetes: Secondary | ICD-10-CM

## 2021-05-02 LAB — POCT GLYCOSYLATED HEMOGLOBIN (HGB A1C): HbA1c, POC (controlled diabetic range): 6 % (ref 0.0–7.0)

## 2021-05-02 MED ORDER — LEVOTHYROXINE SODIUM 175 MCG PO TABS: ORAL_TABLET | ORAL | 1 refills | Status: DC

## 2021-05-02 NOTE — Progress Notes (Signed)
05/02/2021    Endocrinology follow-up note   Subjective:    Patient ID: Teresa Vincent, female    DOB: 1981-03-05, PCP Mikey Kirschner, MD  Past Medical History:  Diagnosis Date   Abnormal Pap smear of cervix    ANXIETY 08/30/2007   Asthma    Chronic back pain    Claustrophobia    pt requests Versed before surgery.   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     Past Surgical History:  Procedure Laterality Date   ABDOMINAL EXPOSURE N/A 05/29/2017   Procedure: ABDOMINAL EXPOSURE;  Surgeon: Angelia Mould, MD;  Location: [facility] OR;  Service: Vascular;  Laterality: N/A;   ABDOMINAL HYSTERECTOMY     ANTERIOR LUMBAR FUSION N/A 05/29/2017   Procedure: Anterior Lumbar Interbody Fusion  - Lumbar five sacral one;  Surgeon: Eustace Moore, MD;  Location: [place];  Service: Neurosurgery;  Laterality: N/A;   BIOPSY  02/17/2020   Procedure: BIOPSY;  Surgeon: Eloise Harman, DO;  [facility];  Service: Endoscopy;;  gastric   CARPAL TUNNEL RELEASE Bilateral    CHOLECYSTECTOMY     CHOLECYSTECTOMY N/A 12/29/2016   Procedure: LAPAROSCOPIC CHOLECYSTECTOMY;  Surgeon: Virl Cagey, MD;  Location: [facility];  Service: General;  Laterality: N/A;   COLONOSCOPY WITH PROPOFOL N/A 02/17/2020   non-bleeding internal hemorrhoids, otherwise normal.   CYSTOSCOPY/URETEROSCOPY/HOLMIUM LASER/STENT PLACEMENT Right 06/25/2019   Procedure: CYSTOSCOPY WITH RIGHT RETROGRADE PYELOGRAM/RIGHT URETEROSCOPY/HOLMIUM LASER APPLICATION RIGHT URETERAL CALCULUS/RIGHT URETERAL STENT PLACEMENT;  Surgeon: Cleon Gustin, MD;   Location: [facility];  Service: Urology;  Laterality: Right;   ESOPHAGOGASTRODUODENOSCOPY (EGD) WITH PROPOFOL N/A 02/17/2020   Procedure: ESOPHAGOGASTRODUODENOSCOPY (EGD) WITH PROPOFOL;  Surgeon: Eloise Harman, DO;  [facility];  Service: Endoscopy;  Laterality: N/A;   EXTRACORPOREAL SHOCK WAVE LITHOTRIPSY Right 04/10/2019   Procedure: EXTRACORPOREAL SHOCK WAVE LITHOTRIPSY (ESWL);  Surgeon: Robley [facility], MD;  Location[facility];  Service: Urology;  Laterality: Right;   LAMINECTOMY WITH POSTERIOR LATERAL ARTHRODESIS LEVEL 1 N/A 02/28/2018   Procedure: Laminectomy and Foraminotomy Lumbar Five-Sacral One, posterolateral fusion and fixation;  Surgeon: Eustace Moore, MD;  Location: [place];  Service: Neurosurgery;  Laterality: N/A;  posterior   LUMBAR EPIDURAL INJECTION     STERIOD INJECTION N/A 01/10/2017   Procedure: MINOR EXPAREL INJECTION;  Surgeon: Virl Cagey, MD;  Location: [facility];  Service: General;  Laterality: N/A;   TOTAL ABDOMINAL HYSTERECTOMY W/ BILATERAL SALPINGOOPHORECTOMY N/A    TUBAL LIGATION  2003   Social History   Social History   Socioeconomic History   Marital status: Married    Spouse name: Not on file   Number of children: Not on file   Years of education: Not on file   Highest education level: Not on file  Occupational History   Occupation: Doesn't work outside the home  Tobacco Use   Smoking status: Former    Packs/day: 1.00    Years: 26.00    Pack years: 26.00  Types: Cigarettes    Quit date: 01/07/2021    Years since quitting: 0.3   Smokeless tobacco: Former  Scientific laboratory technician Use: Every day   Substances: Nicotine, Flavoring  Substance and Sexual Activity   Alcohol use: Yes    Alcohol/week: 0.0 standard drinks    Comment: occasional   Drug use: Not Currently    Types: Marijuana    Comment: occasional   Sexual activity: Not Currently    Birth control/protection: Surgical  Other Topics Concern   Not on  file  Social History Narrative   Not on file   Social Determinants of Health   Financial Resource Strain: Not on file  Food Insecurity: Not on file  Transportation Needs: Not on file  Physical Activity: Not on file  Stress: Not on file  Social Connections: Not on file  Intimate Partner Violence: Not on file     Current Outpatient Medications  Medication Instructions   albuterol (VENTOLIN HFA) 108 (90 Base) MCG/ACT inhaler 1-2 puffs, Inhalation, Every 4 hours PRN   atorvastatin (LIPITOR) 20 mg, Oral, Daily   Cholecalciferol (D3) 50 MCG (2000 UT) TABS Oral, Daily   clonazePAM (KLONOPIN) 0.5 MG tablet Take 1/2 tab p.o. qhs for insomnia.   cyclobenzaprine (FLEXERIL) 10 mg, Oral, 3 times daily PRN, for muscle spams   levothyroxine (SYNTHROID) 175 MCG tablet TAKE 1 TABLET BY MOUTH ONCE DAILY BEFORE BREAKFAST, TAKE AN EXTRA TABLET ONE DAY A  WEEK   lisinopril (ZESTRIL) 20 mg, Oral, Daily   metoprolol succinate (TOPROL-XL) 50 MG 24 hr tablet 1 tablet, Oral, Daily   omeprazole (PRILOSEC) 40 MG capsule TAKE 1 CAPSULE BY MOUTH TWICE DAILY BEFORE A MEAL   ondansetron (ZOFRAN-ODT) 4 MG disintegrating tablet Take one tablet every 8 hours prn   oxyCODONE-acetaminophen (PERCOCET) 5-325 MG tablet 1 tablet, Oral, 2 times daily PRN, Do Not Fill Before 12/03/2020   SUMAtriptan (IMITREX) 100 MG tablet 1 tablet at the first sign of migraine. Additional dose in 2 hours if headache persists or recurs.      HPI  Teresa Vincent is a 41 year old female patient with medical history as above.  She is being seen in follow-up for hypothyroidism related to remote past thyroid ablation with I-131 for Graves' disease.     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 She was treated with various doses of levothyroxine over the years currently on levothyroxine over the years.  Over the last several visits, her dose was adjusted to current  dose of 150 mcg p.o. daily before breakfast.  She reports compliance and consistency taking her medication.  She has no new complaints today.    Her previsit thyroid function tests are consistent with improvement, still consistent with under replacement.    -She also has hyperlipidemia on atorvastatin , prediabetes with A1c of 6% worsening from 5.6%.  She did not stay on metformin.      Objective:     HEENT: She has prominent eyes, however no significant lid lag.  Recent Results (from the past 2160 hour(s))  TSH     Status: Abnormal   Collection Time: 04/25/21  7:26 AM  Result Value Ref Range   TSH 9.41 (H) mIU/L    Comment:           Reference Range .           > or = 20 Years  0.40-4.50 .  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  She would benefit from slight increase in her levothyroxine.  I advised her to take 1 extra tablet of levothyroxine 175 mcg 1 day a week on top of her daily doses.   - We discussed about the correct intake of her thyroid hormone, on empty stomach at fasting, with water, separated by at least 30 minutes from breakfast and other medications,  and separated by more than 4 hours from calcium, iron, multivitamins, acid reflux medications (PPIs). -Patient is made aware of the fact that thyroid hormone replacement is needed for life, dose to be adjusted by periodic  monitoring of thyroid function tests.    2. Hyperlipidemia-  -She has responded significantly for atorvastatin treatment, with improving LDL.  She is advised to continue atorvastatin 20 mg p.o. daily at bedtime.     3) prediabetes-A1c of 6%.  She has stopped her metformin.  She is promising to work on her diet and exercise.    4) Graves' orbitopathy: She would benefit from evaluation by ophthalmology, will be referred to [facility] for better assessment.   - I advised patient to maintain close follow up with Mikey Kirschner, MD for primary care needs.  -She will return in 6 months with previsit labs.   I spent 23 minutes in the care of the patient today including review of labs from Thyroid Function, CMP, and other relevant labs ; imaging/biopsy records (current and previous including abstractions from other facilities); face-to-face time discussing  her lab results and symptoms, medications doses, her options of short and long term treatment based on the latest standards of care / guidelines;   and documenting the encounter.  Harlequin Colar Raju  participated in the discussions, expressed understanding, and voiced agreement with the above plans.  All questions were answered to her satisfaction. she is encouraged to contact clinic should she have any questions or concerns prior to her return visit.    Glade Lloyd, MD Phone: [phone]  Fax: [phone]  -  This note was partially dictated with voice recognition software. Similar sounding words can be transcribed inadequately or may not  be corrected upon review.  06/11/2018, 2:32 PM

## 2021-05-02 NOTE — Patient Instructions (Signed)

## 2021-05-10 ENCOUNTER — Other Ambulatory Visit: Payer: Self-pay

## 2021-05-10 ENCOUNTER — Telehealth: Payer: Self-pay

## 2021-05-10 ENCOUNTER — Telehealth: Payer: Self-pay | Admitting: Registered Nurse

## 2021-05-10 ENCOUNTER — Ambulatory Visit: Payer: 59 | Admitting: Physical Medicine and Rehabilitation

## 2021-05-10 ENCOUNTER — Encounter: Payer: BC Managed Care – PPO | Admitting: Registered Nurse

## 2021-05-10 MED ORDER — OXYCODONE-ACETAMINOPHEN 5-325 MG PO TABS: 1.0000 | ORAL_TABLET | Freq: Two times a day (BID) | ORAL | 0 refills | Status: DC | PRN

## 2021-05-10 NOTE — Telephone Encounter (Signed)
Patient unable to keep appt due to insurance being Out of Network. She is scheduled for 3/20. Please send in medications.

## 2021-05-10 NOTE — Telephone Encounter (Signed)
Prior auth for [employer] sent to insurance through [employer]

## 2021-05-10 NOTE — Telephone Encounter (Signed)
PMP was Reviewed.  Ms. Endres will have new insurance on 05/18/2021, she is scheduled for an appointment next month. UDS was Reviewed.  Oxycodone e-scribed today.

## 2021-05-11 ENCOUNTER — Telehealth: Payer: Self-pay | Admitting: Registered Nurse

## 2021-05-11 ENCOUNTER — Telehealth: Payer: Self-pay

## 2021-05-11 NOTE — Telephone Encounter (Signed)
Received fax and message from patient stating more information was needed for PA for Percocet. Spoke with Dahlia Client from [employer] and gave her diagnosis code and directions for use.

## 2021-05-11 NOTE — Telephone Encounter (Signed)
Patient states there is missing information for RX to be filled and it can be called in [phone] and give over phone dx and directions for use .

## 2021-05-11 NOTE — Telephone Encounter (Signed)
Received a denial for the Percocet from [employer]. Called [facility] in [place] to see if the insurance paid for 7 day supply like the denial stated. They stated it is good for a 7 day supply. Spoke with patient and she stated she has a new insurance starting on 05/18/21. Informed patient that we can send in a new prescription for the Percocet when her new insurance starts.

## 2021-05-11 NOTE — Telephone Encounter (Signed)
Spoke with Dahlia Client at [employer] and gave diagnosis code and directions for use.

## 2021-05-11 NOTE — Telephone Encounter (Signed)
Spoke with patient and she has new insurance starting on 05/18/21. She will need a new prescription for the Oxycodone-APAP.

## 2021-05-16 MED ORDER — OXYCODONE-ACETAMINOPHEN 5-325 MG PO TABS: 1.0000 | ORAL_TABLET | Freq: Two times a day (BID) | ORAL | 0 refills | Status: DC | PRN

## 2021-05-16 NOTE — Telephone Encounter (Signed)
PMP was Reviewed.  Oxycodone e-scribed today, Placed a call to Ms. Acevedo regarding the above, she verbalizes understanding.

## 2021-05-16 NOTE — Addendum Note (Signed)
Addended by: Jones Bales on: 05/16/2021 03:01 PM   Modules accepted: Orders

## 2021-05-17 ENCOUNTER — Telehealth: Payer: Self-pay | Admitting: "Endocrinology

## 2021-05-17 ENCOUNTER — Other Ambulatory Visit: Payer: Self-pay | Admitting: "Endocrinology

## 2021-05-17 DIAGNOSIS — E890 Postprocedural hypothyroidism: Secondary | ICD-10-CM

## 2021-05-17 NOTE — Telephone Encounter (Signed)
Called the patient and gave her the message. Patient verbalized an understanding.

## 2021-05-17 NOTE — Telephone Encounter (Signed)
Pt is calling and would like to know what kind of b12 she can take since she is on thyroid medication. She heard it was good for energy and multiple things and would like to try it  [phone]

## 2021-05-18 ENCOUNTER — Telehealth: Payer: Self-pay

## 2021-05-18 NOTE — Telephone Encounter (Signed)
PA for Oxycodone-APAP sent to insurance through CoverMyMeds ?

## 2021-05-18 NOTE — Telephone Encounter (Signed)
Oxycodone- APAP approved 05/18/21-11/14/21 ?

## 2021-05-19 ENCOUNTER — Other Ambulatory Visit: Payer: Self-pay | Admitting: Family Medicine

## 2021-05-19 DIAGNOSIS — E782 Mixed hyperlipidemia: Secondary | ICD-10-CM

## 2021-06-06 ENCOUNTER — Other Ambulatory Visit: Payer: Self-pay

## 2021-06-06 ENCOUNTER — Encounter: Payer: BC Managed Care – PPO | Attending: Registered Nurse | Admitting: Registered Nurse

## 2021-06-06 ENCOUNTER — Encounter: Payer: Self-pay | Admitting: Registered Nurse

## 2021-06-06 VITALS — BP 118/76 | HR 83 | Ht 67.0 in | Wt 252.2 lb

## 2021-06-06 DIAGNOSIS — G894 Chronic pain syndrome: Secondary | ICD-10-CM | POA: Diagnosis not present

## 2021-06-06 DIAGNOSIS — Z981 Arthrodesis status: Secondary | ICD-10-CM | POA: Insufficient documentation

## 2021-06-06 DIAGNOSIS — G43109 Migraine with aura, not intractable, without status migrainosus: Secondary | ICD-10-CM | POA: Insufficient documentation

## 2021-06-06 DIAGNOSIS — M5416 Radiculopathy, lumbar region: Secondary | ICD-10-CM | POA: Insufficient documentation

## 2021-06-06 DIAGNOSIS — Z5181 Encounter for therapeutic drug level monitoring: Secondary | ICD-10-CM | POA: Diagnosis not present

## 2021-06-06 DIAGNOSIS — Z79891 Long term (current) use of opiate analgesic: Secondary | ICD-10-CM | POA: Insufficient documentation

## 2021-06-06 MED ORDER — OXYCODONE-ACETAMINOPHEN 5-325 MG PO TABS: 1.0000 | ORAL_TABLET | Freq: Two times a day (BID) | ORAL | 0 refills | Status: DC | PRN

## 2021-06-06 NOTE — Progress Notes (Signed)
? ?Subjective:  ? ? Patient ID: Teresa Vincent, female    DOB: [birth year], 41 y.o.   MRN: [id number] ? ?HPI: Teresa Vincent is a 41 y.o. female who returns for follow up appointment for chronic pain and medication refill. She states her pain is located in her lower back radiating into her buttocks. She rates her pain 10. Her current exercise regime is walking. ? ?Ms. Meng  is also prescribed clonazepam  by Dr. Lovena Le .We have discussed the black box warning of using opioids and benzodiazepines. I highlighted the dangers of using these drugs together and discussed the adverse events including respiratory suppression, overdose, cognitive impairment and importance of compliance with current regimen. We will continue to monitor and adjust as indicated.  ? ?Last Oral Swab was Performed on 10/12/2020, it was consistent.  ?  ?  ? ?Pain Inventory ?Average Pain 9 ?Pain Right Now 10 ?My pain is sharp, burning, and stabbing ? ?In the last 24 hours, has pain interfered with the following? ?General activity 4 ?Relation with others 3 ?Enjoyment of life 8 ?What TIME of day is your pain at its worst? morning , daytime, evening, and night ?Sleep (in general) Poor ? ?Pain is worse with: walking, bending, sitting, and standing ?Pain improves with: rest and medication ?Relief from Meds: 8 ? ?Family History  ?Problem Relation Age of Onset  ? Hypertension Mother   ? Thyroid disease Neg Hx   ? ?Social History  ? ?Socioeconomic History  ? Marital status: Married  ?  Spouse name: Not on file  ? Number of children: Not on file  ? Years of education: Not on file  ? Highest education level: Not on file  ?Occupational History  ? Occupation: Doesn't work outside the home  ?Tobacco Use  ? Smoking status: Former  ?  Packs/day: 1.00  ?  Years: 26.00  ?  Pack years: 26.00  ?  Types: Cigarettes  ?  Quit date: 01/07/2021  ?  Years since quitting: 0.4  ? Smokeless tobacco: Former  ?Vaping Use  ? Vaping Use: Every day  ? Substances:  Nicotine, Flavoring  ?Substance and Sexual Activity  ? Alcohol use: Yes  ?  Alcohol/week: 0.0 standard drinks  ?  Comment: occasional  ? Drug use: Not Currently  ?  Types: Marijuana  ?  Comment: occasional  ? Sexual activity: Not Currently  ?  Birth control/protection: Surgical  ?Other Topics Concern  ? Not on file  ?Social History Narrative  ? Not on file  ? ?Social Determinants of Health  ? ?Financial Resource Strain: Not on file  ?Food Insecurity: Not on file  ?Transportation Needs: Not on file  ?Physical Activity: Not on file  ?Stress: Not on file  ?Social Connections: Not on file  ? ?Past Surgical History:  ?Procedure Laterality Date  ? ABDOMINAL EXPOSURE N/A 05/29/2017  ? Procedure: ABDOMINAL EXPOSURE;  Surgeon: Angelia Mould, MD;  Location: [facility] OR;  Service: Vascular;  Laterality: N/A;  ? ABDOMINAL HYSTERECTOMY    ? ANTERIOR LUMBAR FUSION N/A 05/29/2017  ? Procedure: Anterior Lumbar Interbody Fusion  - Lumbar five sacral one;  Surgeon: Eustace Moore, MD;  Location: [place];  Service: Neurosurgery;  Laterality: N/A;  ? BIOPSY  02/17/2020  ? Procedure: BIOPSY;  Surgeon: Eloise Harman, DO;  [facility];  Service: Endoscopy;;  gastric  ? CARPAL TUNNEL RELEASE Bilateral   ? CHOLECYSTECTOMY    ? CHOLECYSTECTOMY N/A 12/29/2016  ? Procedure: LAPAROSCOPIC  CHOLECYSTECTOMY;  Surgeon: Virl Cagey, MD;  [facility];  Service: General;  Laterality: N/A;  ? COLONOSCOPY WITH PROPOFOL N/A 02/17/2020  ? non-bleeding internal hemorrhoids, otherwise normal.  ? CYSTOSCOPY/URETEROSCOPY/HOLMIUM LASER/STENT PLACEMENT Right 06/25/2019  ? Procedure: CYSTOSCOPY WITH RIGHT RETROGRADE PYELOGRAM/RIGHT URETEROSCOPY/HOLMIUM LASER APPLICATION RIGHT URETERAL CALCULUS/RIGHT URETERAL STENT PLACEMENT;  Surgeon: Cleon Gustin, MD;  [facility];  Service: Urology;  Laterality: Right;  ? ESOPHAGOGASTRODUODENOSCOPY (EGD) WITH PROPOFOL N/A 02/17/2020  ? Procedure: ESOPHAGOGASTRODUODENOSCOPY (EGD) WITH  PROPOFOL;  Surgeon: Eloise Harman, DO;  [facility];  Service: Endoscopy;  Laterality: N/A;  ? EXTRACORPOREAL SHOCK WAVE LITHOTRIPSY Right 04/10/2019  ? Procedure: EXTRACORPOREAL SHOCK WAVE LITHOTRIPSY (ESWL);  Surgeon: Robley Fries, MD;  Location[facility];  Service: Urology;  Laterality: Right;  ? LAMINECTOMY WITH POSTERIOR LATERAL ARTHRODESIS LEVEL 1 N/A 02/28/2018  ? Procedure: Laminectomy and Foraminotomy Lumbar Five-Sacral One, posterolateral fusion and fixation;  Surgeon: Eustace Moore, MD;  Location: [place];  Service: Neurosurgery;  Laterality: N/A;  posterior  ? LUMBAR EPIDURAL INJECTION    ? STERIOD INJECTION N/A 01/10/2017  ? Procedure: MINOR EXPAREL INJECTION;  Surgeon: Virl Cagey, MD;  [facility];  Service: General;  Laterality: N/A;  ? TOTAL ABDOMINAL HYSTERECTOMY W/ BILATERAL SALPINGOOPHORECTOMY N/A   ? TUBAL LIGATION  2003  ? ?Past Surgical History:  ?Procedure Laterality Date  ? ABDOMINAL EXPOSURE N/A 05/29/2017  ? Procedure: ABDOMINAL EXPOSURE;  Surgeon: Angelia Mould, MD;  Location: [facility] OR;  Service: Vascular;  Laterality: N/A;  ? ABDOMINAL HYSTERECTOMY    ? ANTERIOR LUMBAR FUSION N/A 05/29/2017  ? Procedure: Anterior Lumbar Interbody Fusion  - Lumbar five sacral one;  Surgeon: Eustace Moore, MD;  Location: [place];  Service: Neurosurgery;  Laterality: N/A;  ? BIOPSY  02/17/2020  ? Procedure: BIOPSY;  Surgeon: Eloise Harman, DO;  [facility];  Service: Endoscopy;;  gastric  ? CARPAL TUNNEL RELEASE Bilateral   ? CHOLECYSTECTOMY    ? CHOLECYSTECTOMY N/A 12/29/2016  ? Procedure: LAPAROSCOPIC CHOLECYSTECTOMY;  Surgeon: Virl Cagey, MD;  [facility];  Service: General;  Laterality: N/A;  ? COLONOSCOPY WITH PROPOFOL N/A 02/17/2020  ? non-bleeding internal hemorrhoids, otherwise normal.  ? CYSTOSCOPY/URETEROSCOPY/HOLMIUM LASER/STENT PLACEMENT Right 06/25/2019  ? Procedure: CYSTOSCOPY WITH RIGHT RETROGRADE PYELOGRAM/RIGHT  URETEROSCOPY/HOLMIUM LASER APPLICATION RIGHT URETERAL CALCULUS/RIGHT URETERAL STENT PLACEMENT;  Surgeon: Cleon Gustin, MD;  [facility];  Service: Urology;  Laterality: Right;  ? ESOPHAGOGASTRODUODENOSCOPY (EGD) WITH PROPOFOL N/A 02/17/2020  ? Procedure: ESOPHAGOGASTRODUODENOSCOPY (EGD) WITH PROPOFOL;  Surgeon: Eloise Harman, DO;  [facility];  Service: Endoscopy;  Laterality: N/A;  ? EXTRACORPOREAL SHOCK WAVE LITHOTRIPSY Right 04/10/2019  ? Procedure: EXTRACORPOREAL SHOCK WAVE LITHOTRIPSY (ESWL);  Surgeon: Robley Fries, MD;  Location: [facility];  Service: Urology;  Laterality: Right;  ? LAMINECTOMY WITH POSTERIOR LATERAL ARTHRODESIS LEVEL 1 N/A 02/28/2018  ? Procedure: Laminectomy and Foraminotomy Lumbar Five-Sacral One, posterolateral fusion and fixation;  Surgeon: Eustace Moore, MD;  Location: [place];  Service: Neurosurgery;  Laterality: N/A;  posterior  ? LUMBAR EPIDURAL INJECTION    ? STERIOD INJECTION N/A 01/10/2017  ? Procedure: MINOR EXPAREL INJECTION;  Surgeon: Virl Cagey, MD;  [facility];  Service: General;  Laterality: N/A;  ? TOTAL ABDOMINAL HYSTERECTOMY W/ BILATERAL SALPINGOOPHORECTOMY N/A   ? TUBAL LIGATION  2003  ? ?Past Medical History:  ?Diagnosis Date  ? Abnormal Pap smear of  cervix   ? ANXIETY 08/30/2007  ? Asthma   ? Chronic back pain   ? Claustrophobia   ? pt requests Versed before surgery.  ? Complication of anesthesia   ? hx of waking up during anesthesia  ? GERD (gastroesophageal reflux disease)   ? History of kidney stones   ? Hypertension   ? HYPOTHYROIDISM, POST-RADIATION 02/08/2009  ? INSOMNIA 08/30/2007  ? MIGRAINE HEADACHE 08/30/2007  ? Neck pain   ? Palpitations   ? occasionally couple times a week  ? Sciatica   ? Sleep apnea   ? cpap  ? Stroke [facility]) 2010  ? states, no deficits and on no meds now.  ? ?BP 118/76   Pulse 83   Ht 5\' 7"  (1.702 m)   Wt 252 lb 3.2 oz (114.4 kg)   LMP 02/25/2016   SpO2 95%   BMI 39.50 kg/m?   ? ?Opioid Risk Score:   ?Fall Risk Score:  `1 ? ?Depression screen PHQ 2/9 ? ?Depression screen [facility] 2/9 06/06/2021 03/04/2021 01/07/2021 12/20/2020 10/12/2020 09/10/2020 07/21/2020  ?Decreased Interest 0 0 0 0 0 0 0  ?Down, Depre

## 2021-06-06 NOTE — Patient Instructions (Addendum)
Please send a My-Chart Message: Regarding picking up your medication .  ?

## 2021-06-09 ENCOUNTER — Other Ambulatory Visit: Payer: Self-pay | Admitting: Family Medicine

## 2021-06-09 DIAGNOSIS — E782 Mixed hyperlipidemia: Secondary | ICD-10-CM

## 2021-06-21 ENCOUNTER — Telehealth: Payer: Self-pay | Admitting: Registered Nurse

## 2021-06-21 MED ORDER — OXYCODONE-ACETAMINOPHEN 5-325 MG PO TABS: 1.0000 | ORAL_TABLET | Freq: Two times a day (BID) | ORAL | 0 refills | Status: DC | PRN

## 2021-06-21 NOTE — Telephone Encounter (Signed)
PMP was Reviewed.  ?Oxycodone e-scribed today.  ?Ms. Mckneely is aware via My- Chart message  ?

## 2021-07-16 ENCOUNTER — Other Ambulatory Visit: Payer: Self-pay | Admitting: Family Medicine

## 2021-07-17 ENCOUNTER — Encounter: Payer: Self-pay | Admitting: Physical Medicine and Rehabilitation

## 2021-07-18 ENCOUNTER — Telehealth: Payer: Self-pay | Admitting: Registered Nurse

## 2021-07-18 MED ORDER — OXYCODONE-ACETAMINOPHEN 5-325 MG PO TABS: 1.0000 | ORAL_TABLET | Freq: Two times a day (BID) | ORAL | 0 refills | Status: DC | PRN

## 2021-07-18 NOTE — Telephone Encounter (Signed)
PMP was Reviewed.  ?Oxycodone e-scribed today ?Teresa Vincent is aware of the above via My-Chart Message. ?

## 2021-07-21 ENCOUNTER — Ambulatory Visit: Payer: BC Managed Care – PPO | Admitting: Orthopedic Surgery

## 2021-07-21 ENCOUNTER — Encounter: Payer: Self-pay | Admitting: Orthopedic Surgery

## 2021-07-21 VITALS — BP 100/59 | HR 70 | Ht 67.0 in | Wt 252.0 lb

## 2021-07-21 DIAGNOSIS — M76829 Posterior tibial tendinitis, unspecified leg: Secondary | ICD-10-CM | POA: Diagnosis not present

## 2021-07-21 DIAGNOSIS — M79672 Pain in left foot: Secondary | ICD-10-CM | POA: Diagnosis not present

## 2021-07-21 DIAGNOSIS — M722 Plantar fascial fibromatosis: Secondary | ICD-10-CM

## 2021-07-21 MED ORDER — DICLOFENAC SODIUM 50 MG PO TBEC: 50.0000 mg | DELAYED_RELEASE_TABLET | Freq: Two times a day (BID) | ORAL | 2 refills | Status: DC

## 2021-07-21 NOTE — Patient Instructions (Addendum)
Physical therapy has been ordered for you at [facility]. They should call you to schedule, [phone] is the phone number to call, if you want to call to schedule.   ? ?Buy TULI HEEL CUPS to put in your shoes for heel spurs ? ?Wear your boot as directed until after your next appt. You have 2 weeks to bring it back if for some reason it isn't working for you.  ? ?Return in 4 weeks ?

## 2021-07-21 NOTE — Progress Notes (Signed)
NEW PROBLEM//OFFICE VISIT ? ? ?Chief Complaint  ?Patient presents with  ? Foot Pain  ?  Lt foot and ankle pain for over a year. Pt states she has been told she has a heel spur and the pain is getting worse.   ? Ankle Pain  ? ?HPI 42 year old female seen at [facility] foot [facility] diagnosed with Planter fasciitis placed in a cam walker for 4 weeks and after the cam walker was taken off she was given an injection and told that all that could be done she is here for a new opinion regarding the pain in her left foot which she says is becoming unbearable and she needs something done.  She comes in with notes from the [facility] foot [facility] ankle [facility] seen by Dr. Posey Pronto ? ?She says she did get good relief from the cam walker but it interferes with her ability to work because the boot is open at the toe ? ?We also cause her some back pain as the leg lengths became unequal while wearing ? ?She has really tried several different things including anti-inflammatories, different types of shoe inserts, gel heel pads but not Tuli heel cups, ice and massage therapy on her foot by her husband which actually helped ? ?She complains of pain basically in 2 areas 1 on the plantar aspect of the foot this increases after she has been sitting down and gets back up and just around the medial malleolus ? ? ?ROS: Back pain chronic recurrent after walking with a cam walker some left lower extremity pain on the lateral side of the foot and some left lower back pain ?ROS ?No chest pain or shortness of breath ? ?BP (!) 100/59   Pulse 70   Ht 5\' 7"  (1.702 m)   Wt 252 lb (114.3 kg)   LMP 02/25/2016   BMI 39.47 kg/m?  ? ?Body mass index is 39.47 kg/m?. ? ?General appearance: Well-developed well-nourished no gross deformities ? ?Cardiovascular normal pulse and perfusion normal color without edema ? ?Neurologically no sensation loss or deficits or pathologic reflexes ? ?Psychological: Awake alert and oriented x3 mood and affect  normal ? ?Skin no lacerations or ulcerations no nodularity no palpable masses, no erythema or nodularity ? ?Musculoskeletal:  ? ?She is ambulatory but she has obvious heel pain when she is walking it is noticeable.  She has tenderness on the plantar aspect of the heel the area does looks full and swollen especially near the arch area and she has tenderness around the medial malleolus.  She has a normal anterior drawer test her ankle range of motion is normal Achilles is nontender ? ?She is noted to have BMI of 39 ? ? ? ? ? ?Past Medical History:  ?Diagnosis Date  ? Abnormal Pap smear of cervix   ? ANXIETY 08/30/2007  ? Asthma   ? Chronic back pain   ? Claustrophobia   ? pt requests Versed before surgery.  ? Complication of anesthesia   ? hx of waking up during anesthesia  ? GERD (gastroesophageal reflux disease)   ? History of kidney stones   ? Hypertension   ? HYPOTHYROIDISM, POST-RADIATION 02/08/2009  ? INSOMNIA 08/30/2007  ? MIGRAINE HEADACHE 08/30/2007  ? Neck pain   ? Palpitations   ? occasionally couple times a week  ? Sciatica   ? Sleep apnea   ? cpap  ? Stroke [facility]) 2010  ? states, no deficits and on no meds now.  ? ? ?Past Surgical  History:  ?Procedure Laterality Date  ? ABDOMINAL EXPOSURE N/A 05/29/2017  ? Procedure: ABDOMINAL EXPOSURE;  Surgeon: Angelia Mould, MD;  Location: [facility] OR;  Service: Vascular;  Laterality: N/A;  ? ABDOMINAL HYSTERECTOMY    ? ANTERIOR LUMBAR FUSION N/A 05/29/2017  ? Procedure: Anterior Lumbar Interbody Fusion  - Lumbar five sacral one;  Surgeon: Eustace Moore, MD;  Location: [place];  Service: Neurosurgery;  Laterality: N/A;  ? BIOPSY  02/17/2020  ? Procedure: BIOPSY;  Surgeon: Eloise Harman, DO;  [facility];  Service: Endoscopy;;  gastric  ? CARPAL TUNNEL RELEASE Bilateral   ? CHOLECYSTECTOMY    ? CHOLECYSTECTOMY N/A 12/29/2016  ? Procedure: LAPAROSCOPIC CHOLECYSTECTOMY;  Surgeon: Virl Cagey, MD;  [facility];  Service: General;  Laterality: N/A;   ? COLONOSCOPY WITH PROPOFOL N/A 02/17/2020  ? non-bleeding internal hemorrhoids, otherwise normal.  ? CYSTOSCOPY/URETEROSCOPY/HOLMIUM LASER/STENT PLACEMENT Right 06/25/2019  ? Procedure: CYSTOSCOPY WITH RIGHT RETROGRADE PYELOGRAM/RIGHT URETEROSCOPY/HOLMIUM LASER APPLICATION RIGHT URETERAL CALCULUS/RIGHT URETERAL STENT PLACEMENT;  Surgeon: Cleon Gustin, MD;  [facility];  Service: Urology;  Laterality: Right;  ? ESOPHAGOGASTRODUODENOSCOPY (EGD) WITH PROPOFOL N/A 02/17/2020  ? Procedure: ESOPHAGOGASTRODUODENOSCOPY (EGD) WITH PROPOFOL;  Surgeon: Eloise Harman, DO;  [facility];  Service: Endoscopy;  Laterality: N/A;  ? EXTRACORPOREAL SHOCK WAVE LITHOTRIPSY Right 04/10/2019  ? Procedure: EXTRACORPOREAL SHOCK WAVE LITHOTRIPSY (ESWL);  Surgeon: Robley Fries, MD;  Location: [facility];  Service: Urology;  Laterality: Right;  ? LAMINECTOMY WITH POSTERIOR LATERAL ARTHRODESIS LEVEL 1 N/A 02/28/2018  ? Procedure: Laminectomy and Foraminotomy Lumbar Five-Sacral One, posterolateral fusion and fixation;  Surgeon: Eustace Moore, MD;  Location: [place];  Service: Neurosurgery;  Laterality: N/A;  posterior  ? LUMBAR EPIDURAL INJECTION    ? STERIOD INJECTION N/A 01/10/2017  ? Procedure: MINOR EXPAREL INJECTION;  Surgeon: Virl Cagey, MD;  [facility];  Service: General;  Laterality: N/A;  ? TOTAL ABDOMINAL HYSTERECTOMY W/ BILATERAL SALPINGOOPHORECTOMY N/A   ? TUBAL LIGATION  2003  ? ? ?Family History  ?Problem Relation Age of Onset  ? Hypertension Mother   ? Thyroid disease Neg Hx   ? ?Social History  ? ?Tobacco Use  ? Smoking status: Former  ?  Packs/day: 1.00  ?  Years: 26.00  ?  Pack years: 26.00  ?  Types: Cigarettes  ?  Quit date: 01/07/2021  ?  Years since quitting: 0.5  ? Smokeless tobacco: Former  ?Vaping Use  ? Vaping Use: Every day  ? Substances: Nicotine, Flavoring  ?Substance Use Topics  ? Alcohol use: Yes  ?  Alcohol/week: 0.0 standard drinks  ?  Comment:  occasional  ? Drug use: Not Currently  ?  Types: Marijuana  ?  Comment: occasional  ? ? ?Allergies  ?Allergen Reactions  ? Bupivacaine Swelling  ?  Pt received CT contrast and Bupivacaine at the same time. Unsure which one caused the reaction. Dr. Jeralyn Ruths recommends a 13 hour prep before either one  ? Contrast Media [Iodinated Contrast Media] Swelling  ?  Pt received CT contrast and Bupivacaine at the same time. Unsure which one caused the reaction. Dr. Jeralyn Ruths recommends a 13 hour prep before either one  ? Indomethacin Other (See Comments)  ?  Dizziness, near syncope, bp went up and BG went down.  ? Adhesive [Tape] Other (See Comments)  ?  Skin irritation, patient prefers paper tape.   ? Celexa [Citalopram Hydrobromide] Other (See Comments)  ?  Make patient too carefree  ? ? ?Current Meds  ?Medication Sig  ? albuterol (VENTOLIN HFA) 108 (90 Base) MCG/ACT inhaler Inhale 1-2 puffs into the lungs every 4 (four) hours as needed for wheezing or shortness of breath.  ? atorvastatin (LIPITOR) 20 MG tablet Take 1 tablet by mouth once daily  ? Cholecalciferol 50 MCG (2000 UT) CAPS Take by mouth.  ? clonazePAM (KLONOPIN) 0.5 MG tablet Take 1/2 tab p.o. qhs for insomnia.  ? cyclobenzaprine (FLEXERIL) 10 MG tablet Take 1 tablet (10 mg total) by mouth 3 (three) times daily as needed. for muscle spams  ? diclofenac (VOLTAREN) 50 MG EC tablet Take 1 tablet (50 mg total) by mouth 2 (two) times daily.  ? levothyroxine (SYNTHROID) 175 MCG tablet TAKE 1 TABLET BY MOUTH ONCE DAILY BEFORE BREAKFAST, TAKE AN EXTRA TABLET ONE DAY A  WEEK  ? lisinopril (ZESTRIL) 20 MG tablet Take 1 tablet (20 mg total) by mouth daily.  ? metoprolol succinate (TOPROL-XL) 50 MG 24 hr tablet TAKE 1 TABLET BY MOUTH ONCE DAILY WITH OR IMMEDIATELY FOLLOWING A MEAL  ? omeprazole (PRILOSEC) 40 MG capsule Take by mouth.  ? ondansetron (ZOFRAN-ODT) 4 MG disintegrating tablet DISSOLVE 1 TABLET IN MOUTH EVERY 8 HOURS AS NEEDED  ? oxyCODONE-acetaminophen (PERCOCET) 5-325  MG tablet Take 1 tablet by mouth 2 (two) times daily as needed for moderate pain.  ? SUMAtriptan (IMITREX) 100 MG tablet 1 tablet at the first sign of migraine. Additional dose in 2 hours if headache persists or recurs.  ? ?

## 2021-08-22 NOTE — Therapy (Incomplete)
OUTPATIENT PHYSICAL THERAPY LOWER EXTREMITY EVALUATION   Patient Name: Teresa Vincent MRN: [id number] DOB:05/27/1980, 41 y.o., female Today's Date: 08/22/2021    Past Medical History:  Diagnosis Date   Abnormal Pap smear of cervix    ANXIETY 08/30/2007   Asthma    Chronic back pain    Claustrophobia    pt requests Versed before surgery.   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   Past Surgical History:  Procedure Laterality Date   ABDOMINAL EXPOSURE N/A 05/29/2017   Procedure: ABDOMINAL EXPOSURE;  Surgeon: Chuck Hint, MD;  Location: [facility] OR;  Service: Vascular;  Laterality: N/A;   ABDOMINAL HYSTERECTOMY     ANTERIOR LUMBAR FUSION N/A 05/29/2017   Procedure: Anterior Lumbar Interbody Fusion  - Lumbar five sacral one;  Surgeon: Tia Alert, MD;  Location[facility] OR;  Service: Neurosurgery;  Laterality: N/A;   BIOPSY  02/17/2020   Procedure: BIOPSY;  Surgeon: Lanelle Bal, DO;  Location: [facility];  Service: Endoscopy;;  gastric   CARPAL TUNNEL RELEASE Bilateral    CHOLECYSTECTOMY     CHOLECYSTECTOMY N/A 12/29/2016   Procedure: LAPAROSCOPIC CHOLECYSTECTOMY;  Surgeon: [name], MD;  Location: [facility] ORS;  Service: General;  Laterality: N/A;   COLONOSCOPY WITH PROPOFOL N/A 02/17/2020   non-bleeding internal hemorrhoids, otherwise normal.   CYSTOSCOPY/URETEROSCOPY/HOLMIUM LASER/STENT PLACEMENT Right 06/25/2019   Procedure: CYSTOSCOPY WITH RIGHT RETROGRADE PYELOGRAM/RIGHT URETEROSCOPY/HOLMIUM LASER APPLICATION RIGHT URETERAL CALCULUS/RIGHT URETERAL STENT PLACEMENT;  Surgeon: Malen Gauze, MD;  Location: [facility];  Service: Urology;  Laterality: Right;   ESOPHAGOGASTRODUODENOSCOPY (EGD) WITH PROPOFOL N/A 02/17/2020   Procedure: ESOPHAGOGASTRODUODENOSCOPY (EGD) WITH PROPOFOL;  Surgeon: Lanelle Bal, DO;  [facility];  Service: Endoscopy;  Laterality: N/A;   EXTRACORPOREAL SHOCK WAVE LITHOTRIPSY Right 04/10/2019   Procedure: EXTRACORPOREAL SHOCK WAVE LITHOTRIPSY (ESWL);  Surgeon: Noel Christmas, MD;  Location: [facility];  Service: Urology;  Laterality: Right;   LAMINECTOMY WITH POSTERIOR LATERAL ARTHRODESIS LEVEL 1 N/A 02/28/2018   Procedure: Laminectomy and Foraminotomy Lumbar Five-Sacral One, posterolateral fusion and fixation;  Surgeon: Tia Alert, MD;  Location: [facility] OR;  Service: Neurosurgery;  Laterality: N/A;  posterior   LUMBAR EPIDURAL INJECTION     STERIOD INJECTION N/A 01/10/2017   Procedure: MINOR EXPAREL INJECTION;  Surgeon: [name], MD;  Location: [facility];  Service: General;  Laterality: N/A;   TOTAL ABDOMINAL HYSTERECTOMY W/ BILATERAL SALPINGOOPHORECTOMY N/A    TUBAL LIGATION  2003   Patient Active Problem List   Diagnosis Date Noted   Chronic pain syndrome 02/28/2021   Hemorrhoids, internal 07/15/2020   Nephrolithiasis 04/23/2019   S/P lumbar fusion 02/28/2018   Prediabetes 12/04/2017   S/P lumbar spinal fusion 05/29/2017   Mixed hyperlipidemia 10/11/2016   Class 2 severe obesity due to excess calories with serious comorbidity and body mass index (BMI) of 36.0 to 36.9 in adult [facility]) 06/10/2015   Essential hypertension, benign 12/30/2012   Esophageal reflux 12/30/2012   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  Rationale for Evaluation and Treatment {HABREHAB:27488}  ONSET DATE: ***  SUBJECTIVE:   SUBJECTIVE  STATEMENT: ***  PERTINENT HISTORY: ***  PAIN:  Are you having pain? {OPRCPAIN:27236}  PRECAUTIONS: {Therapy precautions:24002}  WEIGHT BEARING RESTRICTIONS {Yes ***/No:24003}  FALLS:  Has patient fallen in last 6 months? {fallsyesno:27318}  LIVING ENVIRONMENT: Lives with: {OPRC lives with:25569::"lives with their family"} Lives in: {Lives in:25570} Stairs: {opstairs:27293} Has following equipment at home: {Assistive devices:23999}  OCCUPATION: ***  PLOF: {PLOF:24004}  PATIENT GOALS ***   OBJECTIVE:   DIAGNOSTIC FINDINGS: ***  PATIENT SURVEYS:  {rehab surveys:24030}  COGNITION:  Overall cognitive status: {cognition:24006}     SENSATION: {sensation:27233}  EDEMA:  {edema:24020}  MUSCLE LENGTH: Hamstrings: Right *** deg; Left *** deg Thomas test: Right *** deg; Left *** deg  POSTURE: {posture:25561}  PALPATION: ***  LOWER EXTREMITY ROM:  {AROM/PROM:27142} ROM Right eval Left eval  Hip flexion    Hip extension    Hip abduction    Hip adduction    Hip internal rotation    Hip external rotation    Knee flexion    Knee extension    Ankle dorsiflexion    Ankle plantarflexion    Ankle inversion    Ankle eversion     (Blank rows = not tested)  LOWER EXTREMITY MMT:  MMT Right eval Left eval  Hip flexion    Hip extension    Hip abduction    Hip adduction    Hip internal rotation    Hip external rotation    Knee flexion    Knee extension    Ankle dorsiflexion    Ankle plantarflexion    Ankle inversion    Ankle eversion     (Blank rows = not tested)  LOWER EXTREMITY SPECIAL TESTS:  {LEspecialtests:26242}  FUNCTIONAL TESTS:  {Functional tests:24029}  GAIT: Distance walked: *** Assistive device utilized: {Assistive devices:23999} Level of assistance: {Levels of assistance:24026} Comments: ***    TODAY'S TREATMENT: ***   PATIENT EDUCATION:  Education details: *** Person educated: {Person educated:25204} Education method:  {Education Method:25205} Education comprehension: {Education Comprehension:25206}   HOME EXERCISE PROGRAM: ***  ASSESSMENT:  CLINICAL IMPRESSION: Patient is a *** y.o. *** who was seen today for physical therapy evaluation and treatment for ***.    OBJECTIVE IMPAIRMENTS {opptimpairments:25111}.   ACTIVITY LIMITATIONS {activitylimitations:27494}  PARTICIPATION LIMITATIONS: {participationrestrictions:25113}  PERSONAL FACTORS {Personal factors:25162} are also affecting patient's functional outcome.   REHAB POTENTIAL: {rehabpotential:25112}  CLINICAL DECISION MAKING: {clinical decision making:25114}  EVALUATION COMPLEXITY: {Evaluation complexity:25115}   GOALS: Goals reviewed with patient? {yes/no:20286}  SHORT TERM GOALS: Target date: {follow up:25551}   *** Baseline: Goal status: {GOALSTATUS:25110}  2.  *** Baseline:  Goal status: {GOALSTATUS:25110}  3.  *** Baseline:  Goal status: {GOALSTATUS:25110}  4.  *** Baseline:  Goal status: {GOALSTATUS:25110}  5.  *** Baseline:  Goal status: {GOALSTATUS:25110}  6.  *** Baseline:  Goal status: {GOALSTATUS:25110}  LONG TERM GOALS: Target date: {follow up:25551}   *** Baseline:  Goal status: {GOALSTATUS:25110}  2.  *** Baseline:  Goal status: {GOALSTATUS:25110}  3.  *** Baseline:  Goal status: {GOALSTATUS:25110}  4.  *** Baseline:  Goal status: {GOALSTATUS:25110}  5.  *** Baseline:  Goal status: {GOALSTATUS:25110}  6.  *** Baseline:  Goal status: {GOALSTATUS:25110}   PLAN: PT FREQUENCY: {rehab frequency:25116}  PT  DURATION: {rehab duration:25117}  PLANNED INTERVENTIONS: {rehab planned interventions:25118::"Therapeutic exercises","Therapeutic activity","Neuromuscular re-education","Balance training","Gait training","Patient/Family education","Joint mobilization"}  PLAN FOR NEXT SESSION: ***   Marisue Brooklyn, PT 08/22/2021, 3:28 PM

## 2021-08-23 ENCOUNTER — Ambulatory Visit (HOSPITAL_COMMUNITY): Payer: BC Managed Care – PPO

## 2021-08-25 ENCOUNTER — Ambulatory Visit (INDEPENDENT_AMBULATORY_CARE_PROVIDER_SITE_OTHER): Payer: BC Managed Care – PPO | Admitting: Orthopedic Surgery

## 2021-08-25 ENCOUNTER — Encounter: Payer: Self-pay | Admitting: Physical Medicine and Rehabilitation

## 2021-08-25 ENCOUNTER — Encounter: Payer: Self-pay | Admitting: Orthopedic Surgery

## 2021-08-25 ENCOUNTER — Encounter: Payer: BC Managed Care – PPO | Attending: Registered Nurse | Admitting: Physical Medicine and Rehabilitation

## 2021-08-25 VITALS — Ht 67.0 in | Wt 253.0 lb

## 2021-08-25 VITALS — BP 141/94 | HR 84 | Ht 67.0 in | Wt 253.0 lb

## 2021-08-25 DIAGNOSIS — G5752 Tarsal tunnel syndrome, left lower limb: Secondary | ICD-10-CM | POA: Diagnosis not present

## 2021-08-25 DIAGNOSIS — M79672 Pain in left foot: Secondary | ICD-10-CM | POA: Insufficient documentation

## 2021-08-25 DIAGNOSIS — G8929 Other chronic pain: Secondary | ICD-10-CM | POA: Diagnosis not present

## 2021-08-25 DIAGNOSIS — M722 Plantar fascial fibromatosis: Secondary | ICD-10-CM

## 2021-08-25 DIAGNOSIS — Z79891 Long term (current) use of opiate analgesic: Secondary | ICD-10-CM | POA: Insufficient documentation

## 2021-08-25 DIAGNOSIS — G894 Chronic pain syndrome: Secondary | ICD-10-CM | POA: Diagnosis not present

## 2021-08-25 DIAGNOSIS — Z5181 Encounter for therapeutic drug level monitoring: Secondary | ICD-10-CM | POA: Diagnosis not present

## 2021-08-25 MED ORDER — OXYCODONE-ACETAMINOPHEN 5-325 MG PO TABS: 1.0000 | ORAL_TABLET | Freq: Two times a day (BID) | ORAL | 0 refills | Status: DC | PRN

## 2021-08-25 NOTE — Patient Instructions (Addendum)
Foods that may reduce pain: 1) Ginger (especially studied for arthritis)- reduce leukotriene production to decrease inflammation 2) Blueberries- high in phytonutrients that decrease inflammation 3) Salmon- marine omega-3s reduce joint swelling and pain 4) Pumpkin seeds- reduce inflammation 5) dark chocolate- reduces inflammation 6) turmeric- reduces inflammation 7) tart cherries - reduce pain and stiffness 8) extra virgin olive oil - its compound olecanthal helps to block prostaglandins  9) chili peppers- can be eaten or applied topically via capsaicin 10) mint- helpful for headache, muscle aches, joint pain, and itching 11) garlic- reduces inflammation  Link to further information on diet for chronic pain: [email or web address]   Foods for weight loss: -Discussed foods that can assist in weight loss: 1) leafy greens- high in fiber and nutrients 2) dark chocolate- improves metabolism (if prefer sweetened, best to sweeten with honey instead of sugar).  3) cruciferous vegetables- high in fiber and protein 4) full fat yogurt: high in healthy fat, protein, calcium, and probiotics 5) apples- high in a variety of phytochemicals 6) nuts- high in fiber and protein that increase feelings of fullness 7) grapefruit: rich in nutrients, antioxidants, and fiber (not to be taken with anticoagulation) 8) beans- high in protein and fiber 9) salmon- has high quality protein and healthy fats 10) green tea- rich in polyphenols 11) eggs- rich in choline and vitamin D 12) tuna- high protein, boosts metabolism 13) avocado- decreases visceral abdominal fat 14) chicken (pasture raised): high in protein and iron 15) blueberries- reduce abdominal fat and cholesterol 16) whole grains- decreases calories retained during digestion, speeds metabolism 17) chia seeds- curb appetite 18) chilies- increases fat metabolism  -Discussed supplements  that can be used:  1) Metatrim 400mg  BID 30 minutes before breakfast and dinner  2) Sphaeranthus indicus and Garcinia mangostana (combinations of these and #1 can be found in capsicum and zychrome  3) green coffee bean extract 400mg  twice per day or Irvingia (african mango) 150 to 300mg  twice per day.

## 2021-08-25 NOTE — Progress Notes (Signed)
Chief Complaint  Patient presents with   Foot Pain    Lt foot pain not getting any better, didn't go to therapy due to schedules being so backed up.    History is a 41 year old female seen at [facility] diagnosed with Planter fasciitis placed in a cam walker for 4 weeks.  After the cam walker was removed she was given an injection and she said that was all that can be done.  She presented to [place] for second opinion still complaining of vague medial and plantar ankle and foot pain  She did try anti-inflammatories common different types of shoe inserts, gel heel pads although they were not Tuli cups, ice, massage therapy.  She did not improve  Our exam revealed tenderness on the plantar aspect of the heel the area had a fullness but out without swelling near the arch she had tenderness around the medial malleolus she had a normal anterior drawer test and normal range of motion Achilles tendon not tender x-rays 3 weightbearing views of the left foot no acute fracture or dislocation  I feel she had a element of PTTD as well as planter fasciitis  I changed her anti-inflammatory to diclofenac 50 twice a day  I put her back in the cam walker but it seemed to cause her back and hip pain  I recommended physical therapy but she was unable to go  At this point she continues with a vague heel/pain around the medial malleolus  I did a Tinel's I did not get a positive finding but she seems to have an element of perhaps tarsal tunnel syndrome though this is a rare diagnosis  I am really not sure what she has some good to send her for consultation with the foot and ankle specialist  She will wear the boot intermittently to decrease some of the hip pain and continue with anti-inflammatories

## 2021-08-25 NOTE — Patient Instructions (Signed)
Driving Directions to [facility] from [facility] Office address is [address] [place] The phone number is [phone] Dr Marcus Duda   1. Start out going north on [address] toward [address].  Then 0.02 miles0.02 total miles 2. Take the 1st right onto [address]. Continue to follow [address]  If you reach [address] you've gone a little too far  Then 0.58 miles0.60 total miles 3. Turn right onto [address]  [address] just past [address]  Then 2.25 miles2.85 total miles 4. Take the [address] ramp toward [place].  Then 0.25 miles3.10 total miles 5. Merge onto [address]  Then 18.17 miles21.28 total miles 6. Merge onto [address]  Then 1.47 miles22.74 total miles 7. Turn right onto [address]  [address] just past [address]  Then 0.11 miles22.85 total miles  8. [address], [place], [place] [id number], [address] is on the left.  

## 2021-08-25 NOTE — Progress Notes (Signed)
Subjective:    Patient ID: Teresa Vincent, female    DOB: 1980/06/23, 41 y.o.   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  -pain has been severe recently -twice a month she is working 7 days a week and she talked with her boss's boss about slowing down a little and he is seeing if this possible.  -she takes care of the garden center at [employer] -Pain has been present for years -She has had two back surgeries.  -She has had 7 injections but got no relief from these.  -She has gone back to Dr. Ronnald Ramp who did her surgery.  -She was on oxycodone 5mg  that she broke in half to 2.5s which helped. This helped her to get through the day. She has been on it for years. She has been following with Zella Ball.  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  -feels better when laying down, but also painful while sitting -today has been a bad day -she uses her electric blanket and heating pad -she has been working at [employer]- she is the cold at 5am in the morning and this is when the pain is worse.  -the colder it is the worse it is hurting -she got a year membership with chiropractor and has been there for a month. She has not found benefit yet.  2) left knee pain: -has had steroid injection with benefit.  -this has continued to benefit.   3) left foot pain -prescribed meloxicam by Dr. Lacinda Axon -seeing a doctor today and will told whether she needs surgical pain. She wants whatever is necessary to stop the pain -her arch stays swollen -she was supposed to do PT last month but was unable to get in until 6/6  4) Anxiety -she gets anxious about driving and she gets palpitations -she has had a prior stroke and has felt this anxiety  since this time.   5) migraines -has at baseline but last week was very severe, particularly Wednesday -she considered going to the ED on Wednesday due to her pain but pain improved without new intervention by Thursday -she saw her PCP this morning and was recommended to take Imitrex -she does have a history of remote stroke which was thought to be related to her thyroid disease  6) Loss of cervical lordosis: -working with chiropractor on improving this and has already seen improvements -joinged a gym   Pain Inventory Average Pain 8 Pain Right Now 5 My pain is sharp, burning, stabbing, and aching  In the last 24 hours, has pain interfered with the following? General activity 8 Relation with others 7 Enjoyment of life 7 What TIME of day is your pain at its worst? morning , daytime, evening and night Sleep (in general) Poor  Pain is worse with: walking, bending, inactivity, standing, and some activites Pain improves with: medication Relief from Meds: 8     Family History  Problem Relation Age of Onset   Hypertension Mother    Thyroid disease Neg Hx    Social History   Socioeconomic History   Marital status: Married    Spouse name: Not on file   Number of children: Not on file   Years  of education: Not on file   Highest education level: Not on file  Occupational History   Occupation: Doesn't work outside the home  Tobacco Use   Smoking status: Former    Packs/day: 1.00    Years: 26.00    Total pack years: 26.00    Types: Cigarettes    Quit date: 01/07/2021    Years since quitting: 0.6   Smokeless tobacco: Former  Scientific laboratory technician Use: Every day   Substances: Nicotine, Flavoring  Substance and Sexual Activity   Alcohol use: Yes    Alcohol/week: 0.0 standard drinks of alcohol    Comment: occasional   Drug use: Not Currently    Types: Marijuana    Comment: occasional   Sexual activity: Not Currently    Birth control/protection: Surgical  Other Topics  Concern   Not on file  Social History Narrative   Not on file   Social Determinants of Health   Financial Resource Strain: Not on file  Food Insecurity: Not on file  Transportation Needs: Not on file  Physical Activity: Not on file  Stress: Not on file  Social Connections: Not on file   Past Surgical History:  Procedure Laterality Date   ABDOMINAL EXPOSURE N/A 05/29/2017   Procedure: ABDOMINAL EXPOSURE;  Surgeon: Angelia Mould, MD;  Location: [place];  Service: Vascular;  Laterality: N/A;   ABDOMINAL HYSTERECTOMY     ANTERIOR LUMBAR FUSION N/A 05/29/2017   Procedure: Anterior Lumbar Interbody Fusion  - Lumbar five sacral one;  Surgeon: Eustace Moore, MD;  Location: [place];  Service: Neurosurgery;  Laterality: N/A;   BIOPSY  02/17/2020   Procedure: BIOPSY;  Surgeon: Eloise Harman, DO;  [facility];  Service: Endoscopy;;  gastric   CARPAL TUNNEL RELEASE Bilateral    CHOLECYSTECTOMY     CHOLECYSTECTOMY N/A 12/29/2016   Procedure: LAPAROSCOPIC CHOLECYSTECTOMY;  Surgeon: Virl Cagey, MD;  Location: [facility];  Service: General;  Laterality: N/A;   COLONOSCOPY WITH PROPOFOL N/A 02/17/2020   non-bleeding internal hemorrhoids, otherwise normal.   CYSTOSCOPY/URETEROSCOPY/HOLMIUM LASER/STENT PLACEMENT Right 06/25/2019   Procedure: CYSTOSCOPY WITH RIGHT RETROGRADE PYELOGRAM/RIGHT URETEROSCOPY/HOLMIUM LASER APPLICATION RIGHT URETERAL CALCULUS/RIGHT URETERAL STENT PLACEMENT;  Surgeon: Cleon Gustin, MD;  Location: [facility];  Service: Urology;  Laterality: Right;   ESOPHAGOGASTRODUODENOSCOPY (EGD) WITH PROPOFOL N/A 02/17/2020   Procedure: ESOPHAGOGASTRODUODENOSCOPY (EGD) WITH PROPOFOL;  Surgeon: Eloise Harman, DO;  [facility];  Service: Endoscopy;  Laterality: N/A;   EXTRACORPOREAL SHOCK WAVE LITHOTRIPSY Right 04/10/2019   Procedure: EXTRACORPOREAL SHOCK WAVE LITHOTRIPSY (ESWL);  Surgeon: Robley Fries, MD;  [facility];   Service: Urology;  Laterality: Right;   LAMINECTOMY WITH POSTERIOR LATERAL ARTHRODESIS LEVEL 1 N/A 02/28/2018   Procedure: Laminectomy and Foraminotomy Lumbar Five-Sacral One, posterolateral fusion and fixation;  Surgeon: Eustace Moore, MD;  Location: [place];  Service: Neurosurgery;  Laterality: N/A;  posterior   LUMBAR EPIDURAL INJECTION     STERIOD INJECTION N/A 01/10/2017   Procedure: MINOR EXPAREL INJECTION;  Surgeon: Virl Cagey, MD;  Location: [facility];  Service: General;  Laterality: N/A;   TOTAL ABDOMINAL HYSTERECTOMY W/ BILATERAL SALPINGOOPHORECTOMY N/A    TUBAL LIGATION  2003   Past Medical History:  Diagnosis Date   Abnormal Pap smear of cervix    ANXIETY 08/30/2007   Asthma    Chronic back pain    Claustrophobia    pt requests Versed before surgery.   Complication of  anesthesia    hx of waking up during anesthesia   GERD (gastroesophageal reflux disease)    History of kidney stones    Hypertension    HYPOTHYROIDISM, POST-RADIATION 02/08/2009   INSOMNIA 08/30/2007   MIGRAINE HEADACHE 08/30/2007   Neck pain    Palpitations    occasionally couple times a week   Sciatica    Sleep apnea    cpap   Stroke ([place]) 2010   states, no deficits and on no meds now.   BP (!) 141/94   Pulse 84   Ht 5\' 7"  (1.702 m)   Wt 253 lb (114.8 kg)   LMP 02/25/2016   SpO2 95%   BMI 39.63 kg/m   Opioid Risk Score:   Fall Risk Score:  `1  Depression screen [facility] 2/9     08/25/2021    9:27 AM 06/06/2021    3:27 PM 03/04/2021    1:01 PM 01/07/2021   12:54 PM 12/20/2020    1:08 PM 10/12/2020    3:05 PM 09/10/2020    1:33 PM  Depression screen PHQ 2/9  Decreased Interest 0 0 0 0 0 0 0  Down, Depressed, Hopeless 0 0 0 0 0 0 0  PHQ - 2 Score 0 0 0 0 0 0 0  Altered sleeping       3  Tired, decreased energy       1  Change in appetite       0  Feeling bad or failure about yourself        0  Trouble concentrating       0  Moving slowly or fidgety/restless       0  Suicidal thoughts        0  PHQ-9 Score       4  Difficult doing work/chores       Not difficult at all    Review of Systems  Constitutional:  Positive for unexpected weight change.  HENT: Negative.    Eyes: Negative.   Respiratory: Negative.    Cardiovascular:  Positive for leg swelling.  Gastrointestinal: Negative.   Endocrine: Negative.   Genitourinary: Negative.   Musculoskeletal:  Positive for arthralgias and back pain.  Skin: Negative.   Allergic/Immunologic: Negative.   Neurological:  Positive for numbness.       Tingling  Hematological: Negative.   Psychiatric/Behavioral:  The patient is nervous/anxious.   All other systems reviewed and are negative.      Objective:   Physical Exam Gen: no distress, normal appearing HEENT: oral mucosa pink and moist, NCAT Cardio: Reg rate Chest: normal effort, normal rate of breathing Abd: soft, non-distended Ext: no edema Psych: pleasant, normal affect Skin: intact Neuro: Alert and oriented x3.  Musculoskeletal: Normal ambulation    Assessment & Plan:  Teresa Vincent is a 42 year old woman who presents to establish care for lumbar spinal stenosis.  1) Lumbar spinal stenosis with lumbar neurogenic claudication.  -Discussed benefits of exercise in reducing pain. -Refilled percocet -Discussed current symptoms -She is allergic to bupivicaine, she has tried surgery, injections, medication. Oxycodone 5mg  helped. She has never failed a drug screen. She works at [employer]. Increase oxycodone to 5mg  daily.  -Will obtain UDS and pain contract today. If results negative, can prescribe Percocet 5mg -325 monthly PRN.  Lumbar MRI reviewed as follows:  IMPRESSION: 1. No impingement identified in the lumbar spine. 2. 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 3. Degenerative disc  disease at T11-12, L2- 3, L3-4, and L5-S1. 4. Cholelithiasis.  -Discussed following foods that may reduce pain: 1) Ginger (especially studied for  arthritis)- reduce leukotriene production to decrease inflammation 2) Blueberries- high in phytonutrients that decrease inflammation 3) Salmon- marine omega-3s reduce joint swelling and pain 4) Pumpkin seeds- reduce inflammation 5) dark chocolate- reduces inflammation 6) turmeric- reduces inflammation 7) tart cherries - reduce pain and stiffness 8) extra virgin olive oil - its compound olecanthal helps to block prostaglandins  9) chili peppers- can be eaten or applied topically via capsaicin 10) mint- helpful for headache, muscle aches, joint pain, and itching 11) garlic- reduces inflammation  Link to further information on diet for chronic pain: [email or web address].randall.[email or web address]    2) Obesity: weight 253 lbs, BMI 39.63.  -Educated regarding health benefits of weight loss- for pain, general health, chronic disease prevention, immune health, mental health.  -Will monitor weight every visit.  -Consider Ro[place]ois tea daily.  -Discussed the benefits of intermittent fasting. -Discussed foods that can assist in weight loss: 1) leafy greens- high in fiber and nutrients 2) dark chocolate- improves metabolism (if prefer sweetened, best to sweeten with honey instead of sugar).  3) cruciferous vegetables- high in fiber and protein 4) full fat yogurt: high in healthy fat, protein, calcium, and probiotics 5) apples- high in a variety of phytochemicals 6) nuts- high in fiber and protein that increase feelings of fullness 7) grapefruit: rich in nutrients, antioxidants, and fiber (not to be taken with anticoagulation) 8) beans- high in protein and fiber 9) salmon- has high quality protein and healthy fats 10) green tea- rich in polyphenols 11) eggs- rich in choline and vitamin D 12) tuna- high protein, boosts metabolism 13) avocado- decreases visceral abdominal fat 14) chicken (pasture raised): high in protein and iron 15) blueberries- reduce  abdominal fat and cholesterol 16) whole grains- decreases calories retained during digestion, speeds metabolism 17) chia seeds- curb appetite 18) chilies- increases fat metabolism  -Discussed supplements that can be used:  1) Metatrim 400mg  BID 30 minutes before breakfast and dinner  2) Sphaeranthus indicus and Garcinia mangostana (combinations of these and #1 can be found in capsicum and zychrome  3) green coffee bean extract 400mg  twice per day or Irvingia (african mango) 150 to 300mg  twice per day.   3) Anxiety: -Discussed exercise and meditation as tools to decrease anxiety. -Recommended Down Dog Yoga app -Discussed spending time outdoors. -Discussed positive re-framing of anxiety.  -Discussed neuropsych follow-up- she will consider.  -Discussed the following foods that have been show to reduce anxiety: 1) [place] nuts, mushrooms, soy beans due to their high selenium content. Upper limit of toxicity of selenium is 415mcg/day so no more than 3-4 [place] nuts per day.  2) Fatty fish such as salmon, mackerel, sardines, trout, and herring- high in omega-3 fatty acids 3) Eggs- increases serotonin and dopamine 4) Pumpkin seeds- high in omega-3 fatty acids 5) dark chocolate- high in flavanols that increase blood flow to brain 6) turmeric- take with black pepper to increase absorption 7) chamomile tea- antioxidant and anti-inflammatory properties 8) yogurt without sugar- supports gut-brain axis 9) green tea- contains L- theanine 10) blueberries- high in vitamin C and antioxidants 11) [place]- high in tryptophan which gets converted to serotonin 12) bell peppers- rich in vitamin C and antioxidants 13) citrus fruits- rich in vitamin C and antioxidants 14) almonds- high in vitamin E and healthy fats 15) chia seeds- high in omega-3 fatty acids 16) saffron  4) migraines -agree with PCP that  imitrex is a great option. Discussed with her that there is increased risk of stroke. She states thyroid  function is currently well controlled -discussed any potential triggers- she notes new perfume last week which could have been a trigger -advised to continue to be attentive for other food or chemical related triggers

## 2021-08-26 ENCOUNTER — Encounter: Payer: Self-pay | Admitting: Physical Medicine and Rehabilitation

## 2021-08-29 ENCOUNTER — Encounter: Payer: Self-pay | Admitting: Family Medicine

## 2021-08-29 ENCOUNTER — Ambulatory Visit (INDEPENDENT_AMBULATORY_CARE_PROVIDER_SITE_OTHER): Payer: BC Managed Care – PPO

## 2021-08-29 ENCOUNTER — Ambulatory Visit (INDEPENDENT_AMBULATORY_CARE_PROVIDER_SITE_OTHER): Payer: BC Managed Care – PPO | Admitting: Family Medicine

## 2021-08-29 ENCOUNTER — Ambulatory Visit: Payer: BC Managed Care – PPO | Admitting: Orthopedic Surgery

## 2021-08-29 ENCOUNTER — Ambulatory Visit: Payer: Self-pay

## 2021-08-29 VITALS — BP 132/88 | HR 71 | Temp 98.0°F | Wt 256.2 lb

## 2021-08-29 DIAGNOSIS — M79671 Pain in right foot: Secondary | ICD-10-CM

## 2021-08-29 DIAGNOSIS — M25571 Pain in right ankle and joints of right foot: Secondary | ICD-10-CM

## 2021-08-29 DIAGNOSIS — F411 Generalized anxiety disorder: Secondary | ICD-10-CM

## 2021-08-29 DIAGNOSIS — R32 Unspecified urinary incontinence: Secondary | ICD-10-CM | POA: Insufficient documentation

## 2021-08-29 DIAGNOSIS — M25572 Pain in left ankle and joints of left foot: Secondary | ICD-10-CM | POA: Diagnosis not present

## 2021-08-29 DIAGNOSIS — I10 Essential (primary) hypertension: Secondary | ICD-10-CM

## 2021-08-29 MED ORDER — CYCLOBENZAPRINE HCL 10 MG PO TABS: 10.0000 mg | ORAL_TABLET | Freq: Three times a day (TID) | ORAL | 1 refills | Status: DC | PRN

## 2021-08-29 MED ORDER — CLONAZEPAM 0.5 MG PO TABS: ORAL_TABLET | ORAL | 1 refills | Status: DC

## 2021-08-29 MED ORDER — ALBUTEROL SULFATE HFA 108 (90 BASE) MCG/ACT IN AERS: 1.0000 | INHALATION_SPRAY | RESPIRATORY_TRACT | 3 refills | Status: AC | PRN

## 2021-08-29 NOTE — Assessment & Plan Note (Signed)
Concern for overflow incontinence.  Needs thorough evaluation by urology.  Placing referral.

## 2021-08-29 NOTE — Assessment & Plan Note (Signed)
Hypertension is stable.  Continue lisinopril and metoprolol.

## 2021-08-29 NOTE — Progress Notes (Signed)
Subjective:  Patient ID: Teresa Vincent, female    DOB: 17-Jun-1980  Age: 41 y.o. MRN: [id number]  CC: Chief Complaint  Patient presents with   Hypertension    Blood pressure has been ok. Has been seeing foot doctor. Total hysterectomy 2018; pt states about a year she has had a vibrating feeling in bladder and has had some bed wetting(every night and has worsened). Does not happen during the day; only at night. Throat has been feeling scratchy/dry for the last 3 days-no other symptoms.     HPI:  41 year old female with hypertension, migraine, hypothyroidism, history of nephrolithiasis, chronic pain, hyperlipidemia presents for follow-up.  Patient reports that she has had ongoing issues with incontinence.  She states that this has been going on approximately 1 year.  She states that she has no frequency or urgency during the day.  At night she gets up 4-5 times a night and uses restroom.  She states that she often wets the bed and is unaware of this occurring.  She uses incontinence pads but will often leak through these as well.  She states that she urinates normally during the day.  Patient's hypertension is stable on metoprolol and lisinopril.  Patient has anxiety and uses Klonopin.  Needs refill today.  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   Anxiety state 08/30/2007   Migraine headache 08/30/2007   Insomnia 08/30/2007    Social Hx   Social History   Socioeconomic History   Marital status: Married     Spouse name: Not on file   Number of children: Not on file   Years of education: Not on file   Highest education level: Not on file  Occupational History   Occupation: Doesn't work outside the home  Tobacco Use   Smoking status: Former    Packs/day: 1.00    Years: 26.00    Total pack years: 26.00    Types: Cigarettes    Quit date: 01/07/2021    Years since quitting: 0.6   Smokeless tobacco: Former  Scientific laboratory technician Use: Every day   Substances: Nicotine, Flavoring  Substance and Sexual Activity   Alcohol use: Yes    Alcohol/week: 0.0 standard drinks of alcohol    Comment: occasional   Drug use: Not Currently    Types: Marijuana    Comment: occasional   Sexual activity: Not Currently    Birth control/protection: Surgical  Other Topics Concern   Not on file  Social History Narrative   Not on file   Social Determinants of Health   Financial Resource Strain: Not on file  Food Insecurity: Not on file  Transportation Needs: Not on file  Physical Activity: Not on file  Stress: Not on file  Social Connections: Not on file    Review of Systems Per HPI  Objective:  BP 132/88   Pulse 71   Temp 98 F (36.7 C)   Wt 256 lb 3.2 oz (116.2 kg)   LMP 02/25/2016   SpO2 96%   BMI 40.13 kg/m  08/29/2021   10:36 AM 08/25/2021    1:06 PM 08/25/2021    9:04 AM  BP/Weight  Systolic BP Q000111Q  Q000111Q  Diastolic BP 88  94  Wt. (Lbs) 256.2 253 253  BMI 40.13 kg/m2 39.63 kg/m2 39.63 kg/m2    Physical Exam Vitals and nursing note reviewed.  Constitutional:      Appearance: Normal appearance. She is obese.  Cardiovascular:     Rate and Rhythm: Normal rate and regular rhythm.  Pulmonary:     Effort: Pulmonary effort is normal.     Breath sounds: Normal breath sounds. No wheezing, rhonchi or rales.  Abdominal:     General: There is no distension.     Palpations: Abdomen is soft.     Tenderness: There is no abdominal tenderness.  Neurological:     Mental Status: She is  alert.  Psychiatric:        Mood and Affect: Mood normal.        Behavior: Behavior normal.     Lab Results  Component Value Date   WBC 8.7 12/28/2020   HGB 14.6 12/28/2020   HCT 43.5 12/28/2020   PLT 250 12/28/2020   GLUCOSE 101 (H) 12/28/2020   CHOL 167 04/23/2020   TRIG 214 (H) 04/23/2020   HDL 38 (L) 04/23/2020   LDLCALC 98 04/23/2020   ALT 13 12/28/2020   AST 15 12/28/2020   NA 140 12/28/2020   K 5.2 12/28/2020   CL 102 12/28/2020   CREATININE 0.81 12/28/2020   BUN 11 12/28/2020   CO2 23 12/28/2020   TSH 9.41 (H) 04/25/2021   INR 0.89 02/20/2018   HGBA1C 6.0 05/02/2021     Assessment & Plan:   Problem List Items Addressed This Visit       Cardiovascular and Mediastinum   Essential hypertension, benign    Hypertension is stable.  Continue lisinopril and metoprolol.        Other   Anxiety state    Stable.  Continue Klonopin.  Refilled today.      Relevant Medications   clonazePAM (KLONOPIN) 0.5 MG tablet   Urinary incontinence - Primary    Concern for overflow incontinence.  Needs thorough evaluation by urology.  Placing referral.      Relevant Orders   Ambulatory referral to Urology    Meds ordered this encounter  Medications   albuterol (VENTOLIN HFA) 108 (90 Base) MCG/ACT inhaler    Sig: Inhale 1-2 puffs into the lungs every 4 (four) hours as needed for wheezing or shortness of breath.    Dispense:  18 g    Refill:  3   clonazePAM (KLONOPIN) 0.5 MG tablet    Sig: Take 1/2 tab p.o. qhs for insomnia.    Dispense:  30 tablet    Refill:  1   cyclobenzaprine (FLEXERIL) 10 MG tablet    Sig: Take 1 tablet (10 mg total) by mouth 3 (three) times daily as needed for muscle spasms.    Dispense:  90 tablet    Refill:  1    Please consider 90 day supplies to promote better adherence    Follow-up:  Return in about 6 months (around 02/28/2022).  [place]

## 2021-08-29 NOTE — Assessment & Plan Note (Signed)
Stable.  Continue Klonopin.  Refilled today. °

## 2021-08-29 NOTE — Patient Instructions (Signed)
I have refilled your medication and placed a referral to urology.  Follow up in 6 months.

## 2021-08-31 LAB — TOXASSURE SELECT,+ANTIDEPR,UR

## 2021-09-05 ENCOUNTER — Telehealth: Payer: Self-pay | Admitting: *Deleted

## 2021-09-05 NOTE — Telephone Encounter (Signed)
Urine drug screen for this encounter is consistent for prescribed medication 

## 2021-09-06 ENCOUNTER — Encounter: Payer: Self-pay | Admitting: Orthopedic Surgery

## 2021-09-06 NOTE — Progress Notes (Signed)
Office Visit Note   Patient: Teresa Vincent           Date of Birth: 1980/07/28           MRN: [id number] Visit Date: 08/29/2021              Requested by: Vickki Hearing, MD [address] [place],  [place] [address] PCP: Tommie Sams, DO  Chief Complaint  Patient presents with   Left Foot - Pain   Right Foot - Pain   Left Ankle - Pain      HPI: Patient is a 41 year old woman who is seen for bilateral heel pain for about a year no known injury.  Patient states she feels like she is walking on glass.  She states she has swelling in the arch and burning pain on the entire foot pain with ambulation.  Patient has been treated with a fracture boot on the left without relief she states she has tried injections in her heels without relief.  Patient is currently under pain management and taking oxycodone.  Patient has had back surgery x2.  Assessment & Plan: Visit Diagnoses:  1. Pain in left ankle and joints of left foot   2. Pain in right foot     Plan: Patient has a cavovarus foot bilaterally recommended Achilles stretching this was demonstrated recommended arch supports.  Follow-Up Instructions: Return in about 4 weeks (around 09/26/2021).   Ortho Exam  Patient is alert, oriented, no adenopathy, well-dressed, normal affect, normal respiratory effort. On examination patient has a good dorsalis pedis pulse bilaterally she does have Achilles contracture bilaterally with dorsiflexion short of neutral with the knee extended she has numbness across the metatarsal heads secondary to the Achilles contracture and the cavus foot.  She does have a short second metatarsal bilaterally.  She has pain to palpation of the origin of the plantar fascia.  Imaging: No results found. No images are attached to the encounter.  Labs: Lab Results  Component Value Date   HGBA1C 6.0 05/02/2021   HGBA1C 6.0 10/27/2020   HGBA1C 5.6 08/25/2019   REPTSTATUS 01/27/2021 FINAL 01/24/2021    CULT  01/24/2021    NO GROUP A STREP (S.PYOGENES) ISOLATED Performed at [facility], [address], [place], [place] [address]      Lab Results  Component Value Date   ALBUMIN 4.5 12/28/2020   ALBUMIN 4.3 06/26/2019   ALBUMIN 4.1 03/28/2019    No results found for: "MG" No results found for: "VD25OH"  No results found for: "PREALBUMIN"    Latest Ref Rng & Units 12/28/2020    9:57 AM 06/26/2019   10:37 AM 06/09/2019    3:07 AM  CBC EXTENDED  WBC 3.4 - 10.8 x10E3/uL 8.7  19.5  10.7   RBC 3.77 - 5.28 x10E6/uL 4.79  4.46  4.24   Hemoglobin 11.1 - 15.9 g/dL 35.4  56.2  56.3   HCT 34.0 - 46.6 % 43.5  41.4  40.0   Platelets 150 - 450 x10E3/uL 250  299  250   NEUT# 1.7 - 7.7 K/uL  15.2  6.9   Lymph# 0.7 - 4.0 K/uL  2.9  2.6      There is no height or weight on file to calculate BMI.  Orders:  Orders Placed This Encounter  Procedures   XR Foot 2 Views Right   XR Foot 2 Views Left   XR Ankle 2 Views Left   No orders  of the defined types were placed in this encounter.    Procedures: No procedures performed  Clinical Data: No additional findings.  ROS:  All other systems negative, except as noted in the HPI. Review of Systems  Objective: Vital Signs: LMP 02/25/2016   Specialty Comments:  No specialty comments available.  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    Family History  Problem Relation Age of Onset   Hypertension Mother    Thyroid disease Neg Hx     Past Surgical History:  Procedure Laterality Date   ABDOMINAL EXPOSURE N/A 05/29/2017   Procedure: ABDOMINAL EXPOSURE;  Surgeon: Chuck Hint, MD;  Location: [facility] OR;  Service: Vascular;  Laterality: N/A;   ABDOMINAL HYSTERECTOMY     ANTERIOR LUMBAR FUSION N/A 05/29/2017   Procedure: Anterior Lumbar Interbody Fusion  - Lumbar five sacral one;  Surgeon: Tia Alert, MD;  Location: [facility] OR;  Service: Neurosurgery;  Laterality: N/A;   BIOPSY  02/17/2020   Procedure: BIOPSY;  Surgeon: Lanelle Bal, DO;  [facility];  Service: Endoscopy;;  gastric   CARPAL TUNNEL RELEASE Bilateral    CHOLECYSTECTOMY     CHOLECYSTECTOMY N/A 12/29/2016   Procedure: LAPAROSCOPIC CHOLECYSTECTOMY;  Surgeon: [name], MD;  Location: [facility];  Service: General;  Laterality: N/A;   COLONOSCOPY WITH PROPOFOL N/A 02/17/2020   non-bleeding internal hemorrhoids, otherwise normal.   CYSTOSCOPY/URETEROSCOPY/HOLMIUM LASER/STENT PLACEMENT Right 06/25/2019   Procedure: CYSTOSCOPY WITH RIGHT RETROGRADE PYELOGRAM/RIGHT URETEROSCOPY/HOLMIUM LASER APPLICATION RIGHT URETERAL CALCULUS/RIGHT URETERAL STENT PLACEMENT;  Surgeon: Malen Gauze, MD;  Location: [facility];  Service: Urology;  Laterality: Right;    ESOPHAGOGASTRODUODENOSCOPY (EGD) WITH PROPOFOL N/A 02/17/2020   Procedure: ESOPHAGOGASTRODUODENOSCOPY (EGD) WITH PROPOFOL;  Surgeon: Lanelle Bal, DO;  [facility];  Service: Endoscopy;  Laterality: N/A;   EXTRACORPOREAL SHOCK WAVE LITHOTRIPSY Right 04/10/2019   Procedure: EXTRACORPOREAL SHOCK WAVE LITHOTRIPSY (ESWL);  Surgeon: Noel Christmas, MD;  Location: [facility];  Service: Urology;  Laterality: Right;   LAMINECTOMY WITH POSTERIOR LATERAL ARTHRODESIS LEVEL 1 N/A 02/28/2018   Procedure: Laminectomy and Foraminotomy Lumbar Five-Sacral One, posterolateral fusion and fixation;  Surgeon: Tia Alert, MD;  Location: [facility] OR;  Service: Neurosurgery;  Laterality: N/A;  posterior   LUMBAR EPIDURAL INJECTION     STERIOD INJECTION N/A 01/10/2017   Procedure: MINOR EXPAREL INJECTION;  Surgeon: [name], MD;  Location: [facility];  Service: General;  Laterality: N/A;   TOTAL ABDOMINAL HYSTERECTOMY W/ BILATERAL SALPINGOOPHORECTOMY N/A    TUBAL LIGATION  2003   Social History   Occupational History   Occupation: Doesn't work outside the home  Tobacco Use   Smoking status: Former    Packs/day: 1.00    Years: 26.00    Total pack years: 26.00    Types: Cigarettes    Quit date: 01/07/2021    Years since quitting: 0.6   Smokeless tobacco: Former  Building services engineer Use: Every day   Substances: Nicotine, Flavoring  Substance and Sexual Activity   Alcohol use: Yes    Alcohol/week: 0.0 standard drinks of alcohol    Comment: occasional   Drug use: Not Currently    Types: Marijuana    Comment: occasional   Sexual activity: Not Currently    Birth control/protection: Surgical

## 2021-09-07 ENCOUNTER — Ambulatory Visit: Payer: BC Managed Care – PPO | Admitting: Urology

## 2021-09-07 ENCOUNTER — Encounter: Payer: Self-pay | Admitting: Urology

## 2021-09-07 VITALS — BP 135/86 | HR 72

## 2021-09-07 DIAGNOSIS — N3944 Nocturnal enuresis: Secondary | ICD-10-CM

## 2021-09-07 DIAGNOSIS — R32 Unspecified urinary incontinence: Secondary | ICD-10-CM | POA: Diagnosis not present

## 2021-09-07 DIAGNOSIS — N3942 Incontinence without sensory awareness: Secondary | ICD-10-CM

## 2021-09-07 LAB — MICROSCOPIC EXAMINATION: Renal Epithel, UA: NONE SEEN /hpf

## 2021-09-07 LAB — URINALYSIS, ROUTINE W REFLEX MICROSCOPIC
Bilirubin, UA: NEGATIVE
Glucose, UA: NEGATIVE
Ketones, UA: NEGATIVE
Leukocytes,UA: NEGATIVE
Nitrite, UA: NEGATIVE
Protein,UA: NEGATIVE
Specific Gravity, UA: 1.025 (ref 1.005–1.030)
Urobilinogen, Ur: 0.2 mg/dL (ref 0.2–1.0)
pH, UA: 5.5 (ref 5.0–7.5)

## 2021-09-07 LAB — BLADDER SCAN AMB NON-IMAGING: Scan Result: 17

## 2021-09-07 MED ORDER — MIRABEGRON ER 25 MG PO TB24: 25.0000 mg | ORAL_TABLET | Freq: Every day | ORAL | 0 refills | Status: DC

## 2021-09-07 NOTE — Progress Notes (Signed)
09/07/2021 3:10 PM   Teresa Vincent 03-07-1981 [id number]  Referring provider: Tommie Sams, DO [address] [place],  [place] [address]  nocturia   HPI: Teresa Vincent is a 41yo here for evaluation of urinary incontinence and nocturia. Over the past year she has noted new onset nocturia and nocturnal enuresis. She has nocturia 4-5x which are large volume and she has nightly nocturnal enuresis. She uses 2-3 pads per night. She has OSA but does not wear her CPAP due to poor fitment. No urinary issues during the day. Urine stream strong. No straining to urinate. PVR 0cc. UA normal    PMH: Past Medical History:  Diagnosis Date   Abnormal Pap smear of cervix    ANXIETY 08/30/2007   Asthma    Chronic back pain    Claustrophobia    pt requests Versed before surgery.   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    Surgical History: Past Surgical History:  Procedure Laterality Date   ABDOMINAL EXPOSURE N/A 05/29/2017   Procedure: ABDOMINAL EXPOSURE;  Surgeon: Chuck Hint, MD;  Location: [facility] OR;  Service: Vascular;  Laterality: N/A;   ABDOMINAL HYSTERECTOMY     ANTERIOR LUMBAR FUSION N/A 05/29/2017   Procedure: Anterior Lumbar Interbody Fusion  - Lumbar five sacral one;  Surgeon: Tia Alert, MD;  Location: [facility] OR;  Service: Neurosurgery;  Laterality: N/A;   BIOPSY  02/17/2020   Procedure: BIOPSY;  Surgeon: Lanelle Bal, DO;  [facility];  Service: Endoscopy;;  gastric   CARPAL TUNNEL RELEASE Bilateral    CHOLECYSTECTOMY     CHOLECYSTECTOMY N/A 12/29/2016   Procedure: LAPAROSCOPIC  CHOLECYSTECTOMY;  Surgeon: [name], MD;  Location: [facility];  Service: General;  Laterality: N/A;   COLONOSCOPY WITH PROPOFOL N/A 02/17/2020   non-bleeding internal hemorrhoids, otherwise normal.   CYSTOSCOPY/URETEROSCOPY/HOLMIUM LASER/STENT PLACEMENT Right 06/25/2019   Procedure: CYSTOSCOPY WITH RIGHT RETROGRADE PYELOGRAM/RIGHT URETEROSCOPY/HOLMIUM LASER APPLICATION RIGHT URETERAL CALCULUS/RIGHT URETERAL STENT PLACEMENT;  Surgeon: Malen Gauze, MD;  Location: [facility];  Service: Urology;  Laterality: Right;   ESOPHAGOGASTRODUODENOSCOPY (EGD) WITH PROPOFOL N/A 02/17/2020   Procedure: ESOPHAGOGASTRODUODENOSCOPY (EGD) WITH PROPOFOL;  Surgeon: Lanelle Bal, DO;  [facility];  Service: Endoscopy;  Laterality: N/A;   EXTRACORPOREAL SHOCK WAVE LITHOTRIPSY Right 04/10/2019   Procedure: EXTRACORPOREAL SHOCK WAVE LITHOTRIPSY (ESWL);  Surgeon: Noel Christmas, MD;  Location: [facility];  Service: Urology;  Laterality: Right;   LAMINECTOMY WITH POSTERIOR LATERAL ARTHRODESIS LEVEL 1 N/A 02/28/2018   Procedure: Laminectomy and Foraminotomy Lumbar Five-Sacral One, posterolateral fusion and fixation;  Surgeon: Tia Alert, MD;  Location: [facility] OR;  Service: Neurosurgery;  Laterality: N/A;  posterior   LUMBAR EPIDURAL INJECTION     STERIOD INJECTION N/A 01/10/2017   Procedure: MINOR EXPAREL INJECTION;  Surgeon: [name], MD;  Location: [facility];  Service: General;  Laterality: N/A;   TOTAL ABDOMINAL HYSTERECTOMY W/ BILATERAL SALPINGOOPHORECTOMY N/A    TUBAL LIGATION  2003  Home Medications:  Allergies as of 09/07/2021       Reactions   Bupivacaine Swelling   Pt received CT contrast and Bupivacaine at the same time. Unsure which one caused the reaction. Dr. Mosetta Putt recommends a 13 hour prep before either one   Contrast Media [iodinated Contrast Media] Swelling   Pt received CT contrast and Bupivacaine at the same time. Unsure which one caused the reaction.  Dr. Mosetta Putt recommends a 13 hour prep before either one   Indomethacin Other (See Comments)   Dizziness, near syncope, bp went up and BG went down.   Adhesive [tape] Other (See Comments)   Skin irritation, patient prefers paper tape.    Celexa [citalopram Hydrobromide] Other (See Comments)   Make patient too carefree        Medication List        Accurate as of September 07, 2021  3:10 PM. If you have any questions, ask your nurse or doctor.          albuterol 108 (90 Base) MCG/ACT inhaler Commonly known as: VENTOLIN HFA Inhale 1-2 puffs into the lungs every 4 (four) hours as needed for wheezing or shortness of breath.   atorvastatin 20 MG tablet Commonly known as: LIPITOR Take 1 tablet by mouth once daily   Cholecalciferol 50 MCG (2000 UT) Caps Take by mouth.   clonazePAM 0.5 MG tablet Commonly known as: KLONOPIN Take 1/2 tab p.o. qhs for insomnia.   cyclobenzaprine 10 MG tablet Commonly known as: FLEXERIL Take 1 tablet (10 mg total) by mouth 3 (three) times daily as needed for muscle spasms.   levothyroxine 175 MCG tablet Commonly known as: SYNTHROID TAKE 1 TABLET BY MOUTH ONCE DAILY BEFORE BREAKFAST, TAKE AN EXTRA TABLET ONE DAY A  WEEK   lisinopril 20 MG tablet Commonly known as: ZESTRIL Take 1 tablet (20 mg total) by mouth daily.   metoprolol succinate 50 MG 24 hr tablet Commonly known as: TOPROL-XL TAKE 1 TABLET BY MOUTH ONCE DAILY WITH OR IMMEDIATELY FOLLOWING A MEAL   omeprazole 40 MG capsule Commonly known as: PRILOSEC Take by mouth.   ondansetron 4 MG disintegrating tablet Commonly known as: ZOFRAN-ODT DISSOLVE 1 TABLET IN MOUTH EVERY 8 HOURS AS NEEDED   oxyCODONE-acetaminophen 5-325 MG tablet Commonly known as: Percocet Take 1 tablet by mouth 2 (two) times daily as needed for moderate pain.   SUMAtriptan 100 MG tablet Commonly known as: Imitrex 1 tablet at the first sign of migraine. Additional dose in 2 hours if headache persists or recurs.         Allergies:  Allergies  Allergen Reactions   Bupivacaine Swelling    Pt received CT contrast and Bupivacaine at the same time. Unsure which one caused the reaction. Dr. Mosetta Putt recommends a 13 hour prep before either one   Contrast Media [Iodinated Contrast Media] Swelling    Pt received CT contrast and Bupivacaine at the same time. Unsure which one caused the reaction. Dr. Mosetta Putt recommends a 13 hour prep before either one   Indomethacin Other (See Comments)    Dizziness, near syncope, bp went up and BG went down.   Adhesive [Tape] Other (See Comments)    Skin irritation, patient prefers paper tape.    Celexa [Citalopram Hydrobromide] Other (See Comments)    Make patient too carefree    Family History: Family History  Problem Relation Age of Onset   Hypertension Mother    Thyroid disease Neg Hx     Social History:  reports that she quit smoking about 7 months ago. Her smoking use included cigarettes. She has a 26.00 pack-year smoking history. She has quit using smokeless tobacco. She reports current alcohol use. She reports that she does not currently use drugs after having used the following drugs: Marijuana.  ROS: All other review of systems were reviewed and are negative except what is noted above in HPI  Physical Exam: BP 135/86   Pulse 72   LMP 02/25/2016   Constitutional:  Alert and oriented, No acute distress. HEENT: [place] AT, moist mucus membranes.  Trachea midline, no masses. Cardiovascular: No clubbing, cyanosis, or edema. Respiratory: Normal respiratory effort, no increased work of breathing. GI: Abdomen is soft, nontender, nondistended, no abdominal masses GU: No CVA tenderness.  Lymph: No cervical or inguinal lymphadenopathy. Skin: No rashes, bruises or suspicious lesions. Neurologic: Grossly intact, no focal deficits, moving all 4 extremities. Psychiatric: Normal mood and affect.  Laboratory Data: Lab Results  Component Value Date   WBC 8.7 12/28/2020    HGB 14.6 12/28/2020   HCT 43.5 12/28/2020   MCV 91 12/28/2020   PLT 250 12/28/2020    Lab Results  Component Value Date   CREATININE 0.81 12/28/2020    No results found for: "PSA"  No results found for: "TESTOSTERONE"  Lab Results  Component Value Date   HGBA1C 6.0 05/02/2021    Urinalysis    Component Value Date/Time   COLORURINE RED (A) 06/26/2019 1051   APPEARANCEUR TURBID (A) 06/26/2019 1051   LABSPEC  06/26/2019 1051    TEST NOT REPORTED DUE TO COLOR INTERFERENCE OF URINE PIGMENT   PHURINE  06/26/2019 1051    TEST NOT REPORTED DUE TO COLOR INTERFERENCE OF URINE PIGMENT   GLUCOSEU (A) 06/26/2019 1051    TEST NOT REPORTED DUE TO COLOR INTERFERENCE OF URINE PIGMENT   HGBUR (A) 06/26/2019 1051    TEST NOT REPORTED DUE TO COLOR INTERFERENCE OF URINE PIGMENT   BILIRUBINUR (A) 06/26/2019 1051    TEST NOT REPORTED DUE TO COLOR INTERFERENCE OF URINE PIGMENT   BILIRUBINUR negative 06/04/2019 1102   KETONESUR (A) 06/26/2019 1051    TEST NOT REPORTED DUE TO COLOR INTERFERENCE OF URINE PIGMENT   PROTEINUR (A) 06/26/2019 1051    TEST NOT REPORTED DUE TO COLOR INTERFERENCE OF URINE PIGMENT   UROBILINOGEN 0.2 06/04/2019 1102   UROBILINOGEN 0.2 06/18/2014 2205   NITRITE (A) 06/26/2019 1051    TEST NOT REPORTED DUE TO COLOR INTERFERENCE OF URINE PIGMENT   LEUKOCYTESUR (A) 06/26/2019 1051    TEST NOT REPORTED DUE TO COLOR INTERFERENCE OF URINE PIGMENT    Lab Results  Component Value Date   BACTERIA FEW (A) 06/26/2019    Pertinent Imaging:  Results for orders placed during the hospital encounter of 06/04/19  Abdomen 1 view (KUB)  Narrative CLINICAL DATA:  Nephrolithiasis  EXAM: ABDOMEN - 1 VIEW  COMPARISON:  04/23/2019  FINDINGS: Prior cholecystectomy and postoperative changes in the lower lumbar spine. There is a non obstructive bowel gas pattern. No supine evidence of free air. No organomegaly or suspicious calcification. No acute bony  abnormality.  IMPRESSION: No acute findings.   Electronically Signed By: Charlett Nose M.D. On: 06/04/2019 14:10  No results found for this or any previous visit.  No results found for this or any previous visit.  No results found for this or any previous visit.  No results found for this or any previous visit.  No results found for this or any previous  visit.  No results found for this or any previous visit.  Results for orders placed during the hospital encounter of 06/04/19  CT RENAL STONE STUDY  Narrative CLINICAL DATA:  . RIGHT flank pain. Pain for 2 weeks. Feels like urinary tract infection. History of stones.  EXAM: CT ABDOMEN AND PELVIS WITHOUT CONTRAST  TECHNIQUE: Multidetector CT imaging of the abdomen and pelvis was performed following the standard protocol without IV contrast.  COMPARISON:  None.  FINDINGS: Lower chest: Lung bases are clear.  Hepatobiliary: No focal hepatic lesion. Postcholecystectomy. No biliary dilatation.  Pancreas: Pancreas is normal. No ductal dilatation. No pancreatic inflammation.  Spleen: Normal spleen  Adrenals/urinary tract: Adrenal glands normal. Small nonobstructing 1 mm calculus in the mid RIGHT kidney. No proximal hydroureter.  Just distal to the RIGHT ovary, there is a calculus measuring 2 mm (image 63/2) which is new from CT 03/28/2019. Findings consistent with a RIGHT ureteral calculus. Calculus approximately 4 cm from the vascular medial junction  No bladder calculi.  No LEFT renal calculi or ureterolithiasis.  Stomach/Bowel: Stomach, small bowel, appendix, and cecum are normal. The colon and rectosigmoid colon are normal.  Vascular/Lymphatic: Abdominal aorta is normal caliber with atherosclerotic calcification. There is no retroperitoneal or periportal lymphadenopathy. No pelvic lymphadenopathy.  Reproductive: Post hysterectomy  Other: No free fluid.  Musculoskeletal: No aggressive osseous  lesion.  IMPRESSION: 1. New distal RIGHT ureteral calculus with minimal obstruction. Calculus approximately 4 cm from the RIGHT vascular junction. 2. Tiny nonobstructing RIGHT renal calculus. 3. No LEFT nephrolithiasis.   Electronically Signed By: Genevive Bi M.D. On: 06/04/2019 16:01   Assessment & Plan:    1. Urinary incontinence, unspecified type -Patient counseled to wear her CPAP since here incontinence and nocturia is related to untreated OSA - Urinalysis, Routine w reflex microscopic - BLADDER SCAN AMB NON-IMAGING  2. Nocturnal enuresis We will trial mirabegron 25mg  daily until she can get her CPAP mask fitted.   No follow-ups on file.  , MD  [facility] [place]

## 2021-09-07 NOTE — Patient Instructions (Signed)

## 2021-09-07 NOTE — Progress Notes (Signed)
post void residual=17 

## 2021-09-23 ENCOUNTER — Other Ambulatory Visit: Payer: Self-pay

## 2021-09-23 DIAGNOSIS — E890 Postprocedural hypothyroidism: Secondary | ICD-10-CM

## 2021-09-23 MED ORDER — LEVOTHYROXINE SODIUM 175 MCG PO TABS: ORAL_TABLET | ORAL | 0 refills | Status: DC

## 2021-09-26 ENCOUNTER — Encounter: Payer: Self-pay | Admitting: Physical Medicine and Rehabilitation

## 2021-09-26 ENCOUNTER — Other Ambulatory Visit: Payer: Self-pay | Admitting: Physical Medicine and Rehabilitation

## 2021-09-26 MED ORDER — OXYCODONE-ACETAMINOPHEN 5-325 MG PO TABS: 1.0000 | ORAL_TABLET | Freq: Two times a day (BID) | ORAL | 0 refills | Status: DC | PRN

## 2021-10-04 ENCOUNTER — Ambulatory Visit: Payer: BC Managed Care – PPO | Admitting: Orthopedic Surgery

## 2021-10-04 DIAGNOSIS — M6701 Short Achilles tendon (acquired), right ankle: Secondary | ICD-10-CM | POA: Diagnosis not present

## 2021-10-10 ENCOUNTER — Ambulatory Visit: Payer: BC Managed Care – PPO | Admitting: Urology

## 2021-10-18 ENCOUNTER — Encounter: Payer: Self-pay | Admitting: Orthopedic Surgery

## 2021-10-18 NOTE — Progress Notes (Signed)
Office Visit Note   Patient: Teresa Vincent           Date of Birth: Jan 16, 1981           MRN: [id number] Visit Date: 10/04/2021              Requested by: Tommie Sams, DO [address] [place],  [place] [address] PCP: Tommie Sams, DO  Chief Complaint  Patient presents with   Right Foot - Follow-up   Left Foot - Follow-up      HPI: Patient is a 41 year old woman with Achilles contracture cavovarus feet and pain with ambulation at the origin of the plantar fascia.  Patient has tried stretching without relief.  Patient complains of pain primarily over the origin of the plantar fascia and anterior medial left ankle pain.  Assessment & Plan: Visit Diagnoses:  1. Achilles tendon contracture, right     Plan: Due to failure of conservative treatment with Achilles stretching and chronic plantar fascia pain on the right patient would like to proceed with a gastrocnemius recession.  Risks and benefits were discussed including the potential for persistent pain.  Patient states she understands wished to proceed at this time we will set this up as outpatient surgery.  Follow-Up Instructions: Return if symptoms worsen or fail to improve.   Ortho Exam  Patient is alert, oriented, no adenopathy, well-dressed, normal affect, normal respiratory effort. Examination patient has palpable pulses.  With her knee extended she has dorsiflexion 20 degrees short of neutral on the right.  She has tenderness to palpation of the origin of the plantar fascia there is no palpable defects of the Achilles or plantar fascia.  Imaging: No results found. No images are attached to the encounter.  Labs: Lab Results  Component Value Date   HGBA1C 6.0 05/02/2021   HGBA1C 6.0 10/27/2020   HGBA1C 5.6 08/25/2019   REPTSTATUS 01/27/2021 FINAL 01/24/2021   CULT  01/24/2021    NO GROUP A STREP (S.PYOGENES) ISOLATED Performed at [facility], [address], [place], [place] [address]       Lab Results  Component Value Date   ALBUMIN 4.5 12/28/2020   ALBUMIN 4.3 06/26/2019   ALBUMIN 4.1 03/28/2019    No results found for: "MG" No results found for: "VD25OH"  No results found for: "PREALBUMIN"    Latest Ref Rng & Units 12/28/2020    9:57 AM 06/26/2019   10:37 AM 06/09/2019    3:07 AM  CBC EXTENDED  WBC 3.4 - 10.8 x10E3/uL 8.7  19.5  10.7   RBC 3.77 - 5.28 x10E6/uL 4.79  4.46  4.24   Hemoglobin 11.1 - 15.9 g/dL 07.3  71.0  62.6   HCT 34.0 - 46.6 % 43.5  41.4  40.0   Platelets 150 - 450 x10E3/uL 250  299  250   NEUT# 1.7 - 7.7 K/uL  15.2  6.9   Lymph# 0.7 - 4.0 K/uL  2.9  2.6      There is no height or weight on file to calculate BMI.  Orders:  No orders of the defined types were placed in this encounter.  No orders of the defined types were placed in this encounter.    Procedures: No procedures performed  Clinical Data: No additional findings.  ROS:  All other systems negative, except as noted in the HPI. Review of Systems  Objective: Vital Signs: LMP 02/25/2016   Specialty Comments:  No specialty comments available.  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   Complication of anesthesia     hx of waking up during anesthesia   GERD (gastroesophageal reflux disease)    History of kidney stones    Hypertension    HYPOTHYROIDISM, POST-RADIATION 02/08/2009   INSOMNIA 08/30/2007   MIGRAINE HEADACHE 08/30/2007   Neck pain    Palpitations    occasionally couple times a week   Sciatica    Sleep apnea    cpap   Stroke ([place]) 2010   states, no deficits and on no meds now.    Family History  Problem Relation Age of Onset   Hypertension Mother    Thyroid disease Neg Hx     Past Surgical History:  Procedure Laterality Date   ABDOMINAL EXPOSURE N/A 05/29/2017   Procedure: ABDOMINAL EXPOSURE;  Surgeon: Angelia Mould, MD;  Location: [facility] OR;  Service: Vascular;  Laterality: N/A;   ABDOMINAL HYSTERECTOMY     ANTERIOR LUMBAR FUSION N/A 05/29/2017   Procedure: Anterior Lumbar Interbody Fusion  - Lumbar five sacral one;  Surgeon: Eustace Moore, MD;  Location: [facility];  Service: Neurosurgery;  Laterality: N/A;   BIOPSY  02/17/2020   Procedure: BIOPSY;  Surgeon: Eloise Harman, DO;  [facility];  Service: Endoscopy;;  gastric   CARPAL TUNNEL RELEASE Bilateral    CHOLECYSTECTOMY     CHOLECYSTECTOMY N/A 12/29/2016   Procedure: LAPAROSCOPIC CHOLECYSTECTOMY;  Surgeon: Virl Cagey, MD;  Location: [facility];  Service: General;  Laterality: N/A;   COLONOSCOPY WITH PROPOFOL N/A 02/17/2020   non-bleeding internal hemorrhoids, otherwise normal.   CYSTOSCOPY/URETEROSCOPY/HOLMIUM LASER/STENT PLACEMENT Right 06/25/2019   Procedure: CYSTOSCOPY WITH RIGHT RETROGRADE PYELOGRAM/RIGHT URETEROSCOPY/HOLMIUM LASER APPLICATION RIGHT URETERAL CALCULUS/RIGHT URETERAL STENT PLACEMENT;  Surgeon: Cleon Gustin, MD;  Location: [facility];  Service: Urology;  Laterality: Right;   ESOPHAGOGASTRODUODENOSCOPY (EGD) WITH PROPOFOL N/A 02/17/2020   Procedure: ESOPHAGOGASTRODUODENOSCOPY (EGD) WITH PROPOFOL;  Surgeon: Eloise Harman, DO;  [facility];  Service: Endoscopy;  Laterality:  N/A;   EXTRACORPOREAL SHOCK WAVE LITHOTRIPSY Right 04/10/2019   Procedure: EXTRACORPOREAL SHOCK WAVE LITHOTRIPSY (ESWL);  Surgeon: Robley Fries, MD;  Location: [facility];  Service: Urology;  Laterality: Right;   LAMINECTOMY WITH POSTERIOR LATERAL ARTHRODESIS LEVEL 1 N/A 02/28/2018   Procedure: Laminectomy and Foraminotomy Lumbar Five-Sacral One, posterolateral fusion and fixation;  Surgeon: Eustace Moore, MD;  Location: [facility];  Service: Neurosurgery;  Laterality: N/A;  posterior   LUMBAR EPIDURAL INJECTION     STERIOD INJECTION N/A 01/10/2017   Procedure: MINOR EXPAREL INJECTION;  Surgeon: Virl Cagey, MD;  Location: [facility];  Service: General;  Laterality: N/A;   TOTAL ABDOMINAL HYSTERECTOMY W/ BILATERAL  SALPINGOOPHORECTOMY N/A    TUBAL LIGATION  2003   Social History   Occupational History   Occupation: Doesn't work outside the home  Tobacco Use   Smoking status: Former    Packs/day: 1.00    Years: 26.00    Total pack years: 26.00    Types: Cigarettes    Quit date: 01/07/2021    Years since quitting: 0.7   Smokeless tobacco: Former  Building services engineer Use: Every day   Substances: Nicotine, Flavoring  Substance and Sexual Activity   Alcohol use: Yes    Alcohol/week: 0.0 standard drinks of alcohol    Comment: occasional   Drug use: Not Currently    Types: Marijuana    Comment: occasional   Sexual activity: Not Currently    Birth control/protection: Surgical

## 2021-10-24 ENCOUNTER — Encounter: Payer: BC Managed Care – PPO | Admitting: Physical Medicine and Rehabilitation

## 2021-10-24 ENCOUNTER — Other Ambulatory Visit: Payer: Self-pay | Admitting: Physical Medicine and Rehabilitation

## 2021-10-24 ENCOUNTER — Telehealth: Payer: Self-pay | Admitting: *Deleted

## 2021-10-24 MED ORDER — OXYCODONE-ACETAMINOPHEN 5-325 MG PO TABS: 1.0000 | ORAL_TABLET | Freq: Two times a day (BID) | ORAL | 0 refills | Status: DC | PRN

## 2021-10-24 NOTE — Telephone Encounter (Signed)
Teresa Vincent's appt was cancelled today due to provider out.  She will need her oxycodone acetaminophen refilled by 10/30/21.

## 2021-10-25 DIAGNOSIS — E890 Postprocedural hypothyroidism: Secondary | ICD-10-CM | POA: Diagnosis not present

## 2021-10-26 LAB — T4, FREE: Free T4: 0.8 ng/dL (ref 0.8–1.8)

## 2021-10-26 LAB — TSH: TSH: 19.04 mIU/L — ABNORMAL HIGH

## 2021-10-28 ENCOUNTER — Other Ambulatory Visit: Payer: Self-pay | Admitting: Family Medicine

## 2021-10-28 DIAGNOSIS — E782 Mixed hyperlipidemia: Secondary | ICD-10-CM

## 2021-10-31 ENCOUNTER — Other Ambulatory Visit: Payer: Self-pay

## 2021-10-31 ENCOUNTER — Observation Stay (HOSPITAL_COMMUNITY)
Admission: EM | Admit: 2021-10-31 | Discharge: 2021-11-01 | Disposition: A | Discharge status: Home / Self Care | Payer: BC Managed Care – PPO | Attending: Family Medicine | Admitting: Family Medicine

## 2021-10-31 ENCOUNTER — Emergency Department (HOSPITAL_COMMUNITY): Payer: BC Managed Care – PPO

## 2021-10-31 ENCOUNTER — Encounter (HOSPITAL_COMMUNITY): Payer: Self-pay

## 2021-10-31 ENCOUNTER — Other Ambulatory Visit (HOSPITAL_COMMUNITY): Payer: BC Managed Care – PPO

## 2021-10-31 ENCOUNTER — Observation Stay (HOSPITAL_BASED_OUTPATIENT_CLINIC_OR_DEPARTMENT_OTHER): Payer: BC Managed Care – PPO

## 2021-10-31 DIAGNOSIS — R42 Dizziness and giddiness: Secondary | ICD-10-CM | POA: Diagnosis not present

## 2021-10-31 DIAGNOSIS — E782 Mixed hyperlipidemia: Secondary | ICD-10-CM | POA: Diagnosis not present

## 2021-10-31 DIAGNOSIS — I6389 Other cerebral infarction: Secondary | ICD-10-CM

## 2021-10-31 DIAGNOSIS — R4789 Other speech disturbances: Secondary | ICD-10-CM

## 2021-10-31 DIAGNOSIS — Z87891 Personal history of nicotine dependence: Secondary | ICD-10-CM | POA: Insufficient documentation

## 2021-10-31 DIAGNOSIS — R200 Anesthesia of skin: Secondary | ICD-10-CM | POA: Diagnosis not present

## 2021-10-31 DIAGNOSIS — Z8673 Personal history of transient ischemic attack (TIA), and cerebral infarction without residual deficits: Secondary | ICD-10-CM | POA: Insufficient documentation

## 2021-10-31 DIAGNOSIS — Z6836 Body mass index (BMI) 36.0-36.9, adult: Secondary | ICD-10-CM | POA: Diagnosis not present

## 2021-10-31 DIAGNOSIS — E890 Postprocedural hypothyroidism: Secondary | ICD-10-CM | POA: Diagnosis not present

## 2021-10-31 DIAGNOSIS — G43909 Migraine, unspecified, not intractable, without status migrainosus: Secondary | ICD-10-CM | POA: Diagnosis not present

## 2021-10-31 DIAGNOSIS — F411 Generalized anxiety disorder: Secondary | ICD-10-CM | POA: Diagnosis not present

## 2021-10-31 DIAGNOSIS — K219 Gastro-esophageal reflux disease without esophagitis: Secondary | ICD-10-CM | POA: Diagnosis not present

## 2021-10-31 DIAGNOSIS — G459 Transient cerebral ischemic attack, unspecified: Secondary | ICD-10-CM | POA: Diagnosis not present

## 2021-10-31 DIAGNOSIS — R29701 NIHSS score 1: Secondary | ICD-10-CM | POA: Insufficient documentation

## 2021-10-31 DIAGNOSIS — J45909 Unspecified asthma, uncomplicated: Secondary | ICD-10-CM | POA: Insufficient documentation

## 2021-10-31 DIAGNOSIS — G8929 Other chronic pain: Secondary | ICD-10-CM | POA: Diagnosis not present

## 2021-10-31 DIAGNOSIS — G43809 Other migraine, not intractable, without status migrainosus: Secondary | ICD-10-CM

## 2021-10-31 DIAGNOSIS — Z20822 Contact with and (suspected) exposure to covid-19: Secondary | ICD-10-CM | POA: Insufficient documentation

## 2021-10-31 DIAGNOSIS — I639 Cerebral infarction, unspecified: Secondary | ICD-10-CM | POA: Diagnosis not present

## 2021-10-31 DIAGNOSIS — Z79899 Other long term (current) drug therapy: Secondary | ICD-10-CM | POA: Insufficient documentation

## 2021-10-31 DIAGNOSIS — I10 Essential (primary) hypertension: Secondary | ICD-10-CM | POA: Diagnosis not present

## 2021-10-31 DIAGNOSIS — E66812 Obesity, class 2: Secondary | ICD-10-CM

## 2021-10-31 DIAGNOSIS — R29818 Other symptoms and signs involving the nervous system: Secondary | ICD-10-CM | POA: Diagnosis not present

## 2021-10-31 DIAGNOSIS — M549 Dorsalgia, unspecified: Secondary | ICD-10-CM | POA: Insufficient documentation

## 2021-10-31 DIAGNOSIS — G43109 Migraine with aura, not intractable, without status migrainosus: Secondary | ICD-10-CM

## 2021-10-31 DIAGNOSIS — G4730 Sleep apnea, unspecified: Secondary | ICD-10-CM | POA: Diagnosis not present

## 2021-10-31 LAB — URINALYSIS, ROUTINE W REFLEX MICROSCOPIC
Bilirubin Urine: NEGATIVE
Glucose, UA: NEGATIVE mg/dL
Hgb urine dipstick: NEGATIVE
Ketones, ur: NEGATIVE mg/dL
Leukocytes,Ua: NEGATIVE
Nitrite: NEGATIVE
Protein, ur: NEGATIVE mg/dL
Specific Gravity, Urine: 1.012 (ref 1.005–1.030)
pH: 6 (ref 5.0–8.0)

## 2021-10-31 LAB — ECHOCARDIOGRAM COMPLETE BUBBLE STUDY
AR max vel: 2.2 cm2
AV Area VTI: 2.39 cm2
AV Area mean vel: 2.5 cm2
AV Mean grad: 5 mmHg
AV Peak grad: 11.6 mmHg
Ao pk vel: 1.7 m/s
Area-P 1/2: 3.13 cm2
MV VTI: 2.39 cm2
S' Lateral: 3.2 cm

## 2021-10-31 LAB — COMPREHENSIVE METABOLIC PANEL
ALT: 21 U/L (ref 0–44)
AST: 21 U/L (ref 15–41)
Albumin: 4.2 g/dL (ref 3.5–5.0)
Alkaline Phosphatase: 64 U/L (ref 38–126)
Anion gap: 7 (ref 5–15)
BUN: 13 mg/dL (ref 6–20)
CO2: 26 mmol/L (ref 22–32)
Calcium: 9.1 mg/dL (ref 8.9–10.3)
Chloride: 104 mmol/L (ref 98–111)
Creatinine, Ser: 0.85 mg/dL (ref 0.44–1.00)
GFR, Estimated: >60 mL/min (ref >60)
Glucose, Bld: 131 mg/dL — ABNORMAL HIGH (ref 70–99)
Potassium: 3.6 mmol/L (ref 3.5–5.1)
Sodium: 137 mmol/L (ref 135–145)
Total Bilirubin: 0.2 mg/dL — ABNORMAL LOW (ref 0.3–1.2)
Total Protein: 7.8 g/dL (ref 6.5–8.1)

## 2021-10-31 LAB — CBC
HCT: 40.4 % (ref 36.0–46.0)
Hemoglobin: 13.7 g/dL (ref 12.0–15.0)
MCH: 30.8 pg (ref 26.0–34.0)
MCHC: 33.9 g/dL (ref 30.0–36.0)
MCV: 90.8 fL (ref 80.0–100.0)
Platelets: 268 10*3/uL (ref 150–400)
RBC: 4.45 MIL/uL (ref 3.87–5.11)
RDW: 13.1 % (ref 11.5–15.5)
WBC: 8.2 10*3/uL (ref 4.0–10.5)
nRBC: 0 % (ref 0.0–0.2)

## 2021-10-31 LAB — I-STAT CHEM 8, ED
BUN: 12 mg/dL (ref 6–20)
Calcium, Ion: 1.18 mmol/L (ref 1.15–1.40)
Chloride: 102 mmol/L (ref 98–111)
Creatinine, Ser: 0.9 mg/dL (ref 0.44–1.00)
Glucose, Bld: 129 mg/dL — ABNORMAL HIGH (ref 70–99)
HCT: 42 % (ref 36.0–46.0)
Hemoglobin: 14.3 g/dL (ref 12.0–15.0)
Potassium: 3.5 mmol/L (ref 3.5–5.1)
Sodium: 139 mmol/L (ref 135–145)
TCO2: 27 mmol/L (ref 22–32)

## 2021-10-31 LAB — DIFFERENTIAL
Abs Immature Granulocytes: 0.03 10*3/uL (ref 0.00–0.07)
Basophils Absolute: 0 10*3/uL (ref 0.0–0.1)
Basophils Relative: 1 %
Eosinophils Absolute: 0.2 10*3/uL (ref 0.0–0.5)
Eosinophils Relative: 2 %
Immature Granulocytes: 0 %
Lymphocytes Relative: 30 %
Lymphs Abs: 2.4 10*3/uL (ref 0.7–4.0)
Monocytes Absolute: 0.5 10*3/uL (ref 0.1–1.0)
Monocytes Relative: 6 %
Neutro Abs: 5.1 10*3/uL (ref 1.7–7.7)
Neutrophils Relative %: 61 %

## 2021-10-31 LAB — APTT: aPTT: 30 seconds (ref 24–36)

## 2021-10-31 LAB — RAPID URINE DRUG SCREEN, HOSP PERFORMED
Amphetamines: NOT DETECTED
Barbiturates: NOT DETECTED
Benzodiazepines: NOT DETECTED
Cocaine: NOT DETECTED
Opiates: NOT DETECTED
Tetrahydrocannabinol: NOT DETECTED

## 2021-10-31 LAB — HEMOGLOBIN A1C
Hgb A1c MFr Bld: 5.9 % — ABNORMAL HIGH (ref 4.8–5.6)
Mean Plasma Glucose: 122.63 mg/dL

## 2021-10-31 LAB — HIV ANTIBODY (ROUTINE TESTING W REFLEX): HIV Screen 4th Generation wRfx: NONREACTIVE

## 2021-10-31 LAB — PROTIME-INR
INR: 1 (ref 0.8–1.2)
Prothrombin Time: 12.7 seconds (ref 11.4–15.2)

## 2021-10-31 LAB — ETHANOL: Alcohol, Ethyl (B): <10 mg/dL (ref <10)

## 2021-10-31 LAB — RESP PANEL BY RT-PCR (FLU A&B, COVID) ARPGX2
Influenza A by PCR: NEGATIVE
Influenza B by PCR: NEGATIVE
SARS Coronavirus 2 by RT PCR: NEGATIVE

## 2021-10-31 LAB — LDL CHOLESTEROL, DIRECT: Direct LDL: 134 mg/dL — ABNORMAL HIGH (ref 0–99)

## 2021-10-31 LAB — TSH: TSH: 40.91 u[IU]/mL — ABNORMAL HIGH (ref 0.350–4.500)

## 2021-10-31 LAB — CBG MONITORING, ED: Glucose-Capillary: 124 mg/dL — ABNORMAL HIGH (ref 70–99)

## 2021-10-31 LAB — PREGNANCY, URINE: Preg Test, Ur: NEGATIVE

## 2021-10-31 MED ORDER — PANTOPRAZOLE SODIUM 40 MG PO TBEC
40.0000 mg | DELAYED_RELEASE_TABLET | Freq: Every day | ORAL | Status: DC
  Filled 2021-10-31: qty 1

## 2021-10-31 MED ORDER — ONDANSETRON 4 MG PO TBDP: 4.0000 mg | ORAL_TABLET | Freq: Three times a day (TID) | ORAL | Status: DC | PRN

## 2021-10-31 MED ORDER — KETOROLAC TROMETHAMINE 30 MG/ML IJ SOLN
30.0000 mg | Freq: Once | INTRAMUSCULAR | Status: AC
  Administered 2021-10-31: 30 mg via INTRAVENOUS
  Filled 2021-10-31: qty 1

## 2021-10-31 MED ORDER — ACETAMINOPHEN 650 MG RE SUPP: 650.0000 mg | RECTAL | Status: DC | PRN

## 2021-10-31 MED ORDER — OXYCODONE-ACETAMINOPHEN 5-325 MG PO TABS: 1.0000 | ORAL_TABLET | Freq: Two times a day (BID) | ORAL | Status: DC | PRN

## 2021-10-31 MED ORDER — CLONAZEPAM 0.5 MG PO TABS
1.0000 mg | ORAL_TABLET | Freq: Three times a day (TID) | ORAL | Status: DC | PRN
  Administered 2021-10-31: 1 mg via ORAL
  Filled 2021-10-31: qty 2

## 2021-10-31 MED ORDER — ENOXAPARIN SODIUM 60 MG/0.6ML IJ SOSY
60.0000 mg | PREFILLED_SYRINGE | INTRAMUSCULAR | Status: DC
  Administered 2021-10-31: 60 mg via SUBCUTANEOUS
  Filled 2021-10-31: qty 0.6

## 2021-10-31 MED ORDER — SUMATRIPTAN SUCCINATE 50 MG PO TABS
100.0000 mg | ORAL_TABLET | ORAL | Status: DC | PRN
Start: 2021-10-31 — End: 2021-11-01

## 2021-10-31 MED ORDER — MIRABEGRON ER 25 MG PO TB24: 25.0000 mg | ORAL_TABLET | Freq: Every day | ORAL | Status: DC

## 2021-10-31 MED ORDER — ALBUTEROL SULFATE (2.5 MG/3ML) 0.083% IN NEBU: 3.0000 mL | INHALATION_SOLUTION | RESPIRATORY_TRACT | Status: DC | PRN

## 2021-10-31 MED ORDER — SODIUM CHLORIDE 0.9 % IV SOLN: INTRAVENOUS | Status: DC

## 2021-10-31 MED ORDER — METOCLOPRAMIDE HCL 5 MG/ML IJ SOLN
10.0000 mg | Freq: Once | INTRAMUSCULAR | Status: AC
  Administered 2021-10-31: 10 mg via INTRAVENOUS
  Filled 2021-10-31: qty 2

## 2021-10-31 MED ORDER — ACETAMINOPHEN 325 MG PO TABS: 650.0000 mg | ORAL_TABLET | ORAL | Status: DC | PRN

## 2021-10-31 MED ORDER — CLOPIDOGREL BISULFATE 75 MG PO TABS
75.0000 mg | ORAL_TABLET | Freq: Every day | ORAL | Status: DC
  Administered 2021-10-31 – 2021-11-01 (×2): 75 mg via ORAL
  Filled 2021-10-31 (×2): qty 1

## 2021-10-31 MED ORDER — ENOXAPARIN SODIUM 40 MG/0.4ML IJ SOSY: 40.0000 mg | PREFILLED_SYRINGE | INTRAMUSCULAR | Status: DC

## 2021-10-31 MED ORDER — ATORVASTATIN CALCIUM 20 MG PO TABS
20.0000 mg | ORAL_TABLET | Freq: Every day | ORAL | Status: DC
  Administered 2021-11-01: 20 mg via ORAL
  Filled 2021-10-31 (×2): qty 1

## 2021-10-31 MED ORDER — ACETAMINOPHEN 160 MG/5ML PO SOLN: 650.0000 mg | ORAL | Status: DC | PRN

## 2021-10-31 MED ORDER — SENNOSIDES-DOCUSATE SODIUM 8.6-50 MG PO TABS: 1.0000 | ORAL_TABLET | Freq: Every evening | ORAL | Status: DC | PRN

## 2021-10-31 MED ORDER — STROKE: EARLY STAGES OF RECOVERY BOOK
Freq: Once | Status: AC
  Filled 2021-10-31: qty 1

## 2021-10-31 MED ORDER — DIPHENHYDRAMINE HCL 50 MG/ML IJ SOLN
25.0000 mg | Freq: Once | INTRAMUSCULAR | Status: AC
  Administered 2021-10-31: 25 mg via INTRAVENOUS
  Filled 2021-10-31: qty 1

## 2021-10-31 MED ORDER — LEVOTHYROXINE SODIUM 75 MCG PO TABS
175.0000 ug | ORAL_TABLET | Freq: Every day | ORAL | Status: DC
  Administered 2021-11-01: 175 ug via ORAL
  Filled 2021-10-31: qty 1

## 2021-10-31 MED ORDER — PANTOPRAZOLE SODIUM 40 MG PO TBEC
40.0000 mg | DELAYED_RELEASE_TABLET | Freq: Two times a day (BID) | ORAL | Status: DC
  Administered 2021-10-31 – 2021-11-01 (×2): 40 mg via ORAL
  Filled 2021-10-31 (×2): qty 1

## 2021-10-31 NOTE — Assessment & Plan Note (Addendum)
-   Continue as needed benzodiazepine, including Klonopin

## 2021-10-31 NOTE — Progress Notes (Signed)
*  PRELIMINARY RESULTS* Echocardiogram 2D Echocardiogram has been performed.  Teresa Vincent 10/31/2021, 2:15 PM

## 2021-10-31 NOTE — Assessment & Plan Note (Signed)
Body mass index is 39.16 kg/m. -Discussed with patient regarding healthier diet and exercise, weight loss programs

## 2021-10-31 NOTE — Assessment & Plan Note (Signed)
-   In ED patient treated with a cocktail including Tylenol, Reglan, Toradol and Benadryl -We will monitor closely, as needed analgesics

## 2021-10-31 NOTE — ED Provider Notes (Signed)
[facility] EMERGENCY DEPARTMENT Provider Note   CSN: [id number] Arrival date & time: 10/31/21  6270  An emergency department physician performed an initial assessment on this suspected stroke patient at 0951.  History {Add pertinent medical, surgical, social history, OB history to HPI:1} Chief Complaint  Patient presents with   Dizziness    Teresa Vincent is a 41 y.o. female.  Patient complains of a headache today and numbness in her left hand with some dizziness.  She has a history of thyroid disease elevated lipids and asthma   Dizziness      Home Medications Prior to Admission medications   Medication Sig Start Date End Date Taking? Authorizing Provider  albuterol (VENTOLIN HFA) 108 (90 Base) MCG/ACT inhaler Inhale 1-2 puffs into the lungs every 4 (four) hours as needed for wheezing or shortness of breath. 08/29/21   Tommie Sams, DO  atorvastatin (LIPITOR) 20 MG tablet Take 1 tablet by mouth once daily 10/28/21   Tommie Sams, DO  Cholecalciferol 50 MCG (2000 UT) CAPS Take by mouth.    [provider]  clonazePAM (KLONOPIN) 0.5 MG tablet Take 1/2 tab p.o. qhs for insomnia. 08/29/21   Tommie Sams, DO  cyclobenzaprine (FLEXERIL) 10 MG tablet Take 1 tablet (10 mg total) by mouth 3 (three) times daily as needed for muscle spasms. 08/29/21   Tommie Sams, DO  levothyroxine (SYNTHROID) 175 MCG tablet TAKE 1 TABLET BY MOUTH ONCE DAILY BEFORE BREAKFAST, TAKE AN EXTRA TABLET ONE DAY A  WEEK 09/23/21   Nida, Denman George, MD  lisinopril (ZESTRIL) 20 MG tablet Take 1 tablet (20 mg total) by mouth daily. 09/10/20   Laroy Apple M, DO  metoprolol succinate (TOPROL-XL) 50 MG 24 hr tablet TAKE 1 TABLET BY MOUTH ONCE DAILY WITH OR IMMEDIATELY FOLLOWING A MEAL 07/18/21   Cook, Jayce G, DO  mirabegron ER (MYRBETRIQ) 25 MG TB24 tablet Take 1 tablet (25 mg total) by mouth daily. 09/07/21   McKenzie, Mardene Celeste, MD  omeprazole (PRILOSEC) 40 MG capsule Take by mouth. 11/13/20    [provider]  ondansetron (ZOFRAN-ODT) 4 MG disintegrating tablet DISSOLVE 1 TABLET IN MOUTH EVERY 8 HOURS AS NEEDED 05/19/21   Tommie Sams, DO  oxyCODONE-acetaminophen (PERCOCET) 5-325 MG tablet Take 1 tablet by mouth 2 (two) times daily as needed for moderate pain. 10/24/21 10/24/22  Horton Chin, MD  SUMAtriptan (IMITREX) 100 MG tablet 1 tablet at the first sign of migraine. Additional dose in 2 hours if headache persists or recurs. 02/28/21   Tommie Sams, DO      Allergies    Bupivacaine, Contrast media [iodinated contrast media], Indomethacin, Adhesive [tape], and Celexa [citalopram hydrobromide]    Review of Systems   Review of Systems  Neurological:  Positive for dizziness.    Physical Exam Updated Vital Signs BP 118/75   Pulse 73   Resp 18   Ht 5\' 7"  (1.702 m)   Wt 113.4 kg   LMP 02/25/2016   SpO2 96%   BMI 39.16 kg/m  Physical Exam  ED Results / Procedures / Treatments   Labs (all labs ordered are listed, but only abnormal results are displayed) Labs Reviewed  COMPREHENSIVE METABOLIC PANEL - Abnormal; Notable for the following components:      Result Value   Glucose, Bld 131 (*)    Total Bilirubin 0.2 (*)    All other components within normal limits  CBG MONITORING, ED - Abnormal; Notable for the following  components:   Glucose-Capillary 124 (*)    All other components within normal limits  I-STAT CHEM 8, ED - Abnormal; Notable for the following components:   Glucose, Bld 129 (*)    All other components within normal limits  RESP PANEL BY RT-PCR (FLU A&B, COVID) ARPGX2  ETHANOL  PROTIME-INR  APTT  CBC  DIFFERENTIAL  RAPID URINE DRUG SCREEN, HOSP PERFORMED  URINALYSIS, ROUTINE W REFLEX MICROSCOPIC  LDL CHOLESTEROL, DIRECT  HEMOGLOBIN A1C  HIV ANTIBODY (ROUTINE TESTING W REFLEX)  POC URINE PREG, ED    EKG None  Radiology CT HEAD CODE STROKE WO CONTRAST  Result Date: 10/31/2021 CLINICAL DATA:  Code stroke.  Numbness in left hand  EXAM: CT HEAD WITHOUT CONTRAST TECHNIQUE: Contiguous axial images were obtained from the base of the skull through the vertex without intravenous contrast. RADIATION DOSE REDUCTION: This exam was performed according to the departmental dose-optimization program which includes automated exposure control, adjustment of the mA and/or kV according to patient size and/or use of iterative reconstruction technique. COMPARISON:  None Available. FINDINGS: Brain: There is no acute intracranial hemorrhage, extra-axial fluid collection, or acute infarct. Parenchymal volume is normal. The ventricles are normal in size. Gray-white differentiation is preserved There is no mass lesion.  There is no mass effect or midline shift. Vascular: Choose Skull: Normal. Negative for fracture or focal lesion. Sinuses/Orbits: The paranasal sinuses are clear. The globes and orbits are unremarkable. Other: None. ASPECTS [facility] Stroke Program Early CT Score) - Ganglionic level infarction (caudate, lentiform nuclei, internal capsule, insula, M1-M3 cortex): 7 - Supraganglionic infarction (M4-M6 cortex): 3 Total score (0-10 with 10 being normal): 10 IMPRESSION: 1. No acute intracranial pathology. 2. ASPECTS is 10 Electronically Signed   By: Lesia Hausen M.D.   On: 10/31/2021 10:05    Procedures Procedures  {Document cardiac monitor, telemetry assessment procedure when appropriate:1}  Medications Ordered in ED Medications  clopidogrel (PLAVIX) tablet 75 mg (75 mg Oral Given 10/31/21 1045)   stroke: early stages of recovery book (has no administration in time range)  0.9 %  sodium chloride infusion (has no administration in time range)  acetaminophen (TYLENOL) tablet 650 mg (has no administration in time range)    Or  acetaminophen (TYLENOL) 160 MG/5ML solution 650 mg (has no administration in time range)    Or  acetaminophen (TYLENOL) suppository 650 mg (has no administration in time range)  senna-docusate (Senokot-S) tablet 1 tablet  (has no administration in time range)  enoxaparin (LOVENOX) injection 60 mg (has no administration in time range)  ketorolac (TORADOL) 30 MG/ML injection 30 mg (30 mg Intravenous Given 10/31/21 1119)  diphenhydrAMINE (BENADRYL) injection 25 mg (25 mg Intravenous Given 10/31/21 1119)  metoCLOPramide (REGLAN) injection 10 mg (10 mg Intravenous Given 10/31/21 1118)    ED Course/ Medical Decision Making/ A&P  CRITICAL CARE Performed by: Bethann Berkshire Total critical care time: 40 minutes Critical care time was exclusive of separately billable procedures and treating other patients. Critical care was necessary to treat or prevent imminent or life-threatening deterioration. Critical care was time spent personally by me on the following activities: development of treatment plan with patient and/or surrogate as well as nursing, discussions with consultants, evaluation of patient's response to treatment, examination of patient, obtaining history from patient or surrogate, ordering and performing treatments and interventions, ordering and review of laboratory studies, ordering and review of radiographic studies, pulse oximetry and re-evaluation of patient's condition.  Medical Decision Making Amount and/or Complexity of Data Reviewed Labs: ordered. Radiology: ordered.  Risk Prescription drug management. Decision regarding hospitalization.   Patient with headache and numbness in left hand.  She was seen by neurology and thought the patient is having a migraine related symptoms or vertigo but neurology felt like she needed to be admitted for stroke work-up  {Document critical care time when appropriate:1} {Document review of labs and clinical decision tools ie heart score, Chads2Vasc2 etc:1}  {Document your independent review of radiology images, and any outside records:1} {Document your discussion with family members, caretakers, and with consultants:1} {Document social  determinants of health affecting pt's care:1} {Document your decision making why or why not admission, treatments were needed:1} Final Clinical Impression(s) / ED Diagnoses Final diagnoses:  None    Rx / DC Orders ED Discharge Orders     None

## 2021-10-31 NOTE — Progress Notes (Signed)
Pt arrived to room 324 via wheelchair from the ED. Pt ambulated from wheelchair to bed. Received report from [place], Charity fundraiser. See assessment. Will continue to monitor.

## 2021-10-31 NOTE — Assessment & Plan Note (Signed)
-   Continue current dose statins -Back in fasting lipid panel

## 2021-10-31 NOTE — Plan of Care (Signed)
  Problem: Education: Goal: Knowledge of General Education information will improve Description: Including pain rating scale, medication(s)/side effects and non-pharmacologic comfort measures Outcome: Progressing   Problem: Health Behavior/Discharge Planning: Goal: Ability to manage health-related needs will improve Outcome: Progressing   Problem: Clinical Measurements: Goal: Diagnostic test results will improve Outcome: Progressing Goal: Respiratory complications will improve Outcome: Progressing   Problem: Activity: Goal: Risk for activity intolerance will decrease Outcome: Progressing   Problem: Nutrition: Goal: Adequate nutrition will be maintained Outcome: Progressing   Problem: Coping: Goal: Level of anxiety will decrease Outcome: Progressing   Problem: Pain Managment: Goal: General experience of comfort will improve Outcome: Progressing   Problem: Safety: Goal: Ability to remain free from injury will improve Outcome: Progressing   Problem: Skin Integrity: Goal: Risk for impaired skin integrity will decrease Outcome: Progressing   

## 2021-10-31 NOTE — ED Notes (Signed)
To MRI

## 2021-10-31 NOTE — Hospital Course (Signed)
Teresa Vincent is a 41 yo woman with hx migraine headaches, chronic back pain, chronic neck pain, HTN who presents with dizziness described as room spinning that started this morning.  Last known well for this was 8:30 AM.  She also has a headache consistent with her history of migraines involving pressure in the right temple that at times radiates to the right neck and shoulder.  The headache did come on after the dizziness today.  She initially reported some slight numbness in her left pinky and ring finger.  But denied of having any focal neurological finding including asymmetric weaknesses, denied having any double vision.  She also states that over the past week she has been having trouble with her words getting jumbled up.  This was a new problem a week ago and is seem to be getting slowly worse since that time.   ED: Blood pressure 118/75, pulse 73, resp. rate 18, height 5\' 7"  (1.702 m), weight 113.4 kg, last menstrual period 02/25/2016, SpO2 96 %. CBC, BMP within normal limits - On examination she did have decreased sensation to light touch more proximally on her left upper extremity as well.  - Outside the TNK window.   - Head CT showed no acute intracranial process (personal review).  NIH stroke scale was 1 for sensory deficit.   Requested by neurologist to be admitted for further stroke work-up

## 2021-10-31 NOTE — Assessment & Plan Note (Signed)
-  Resume home medication of lisinopril, and metoprolol

## 2021-10-31 NOTE — Assessment & Plan Note (Addendum)
-  Possible subjective finding of decreased sensation to light touch more proximally on her left upper extremity as well.  -No other focal neurological findings aside from migraine headaches -CT of the head negative -Medical telemetry floor -Teleneurology consulted..  Continue stroke work-up -Neurochecks, PT/OT/speech evaluation -2D echocardiogram -MRA/MRI of the head -Labs including A1c and LDL -Apparently allergic to aspirin, neurology initiated Plavix -Continue home medication of atorvastatin

## 2021-10-31 NOTE — ED Triage Notes (Signed)
Patient reports that she got dizzy while at work, developed right neck and posterior head pain that she describes as pressure. States that she has headache, BP was elevated at the time and she has left hand (two fingers) numbness. MD called to room for eval.

## 2021-10-31 NOTE — ED Notes (Signed)
Code Stroke called CT and beeped out.

## 2021-10-31 NOTE — Progress Notes (Signed)
  Transition of Care [facility]) Screening Note   Patient Details  Name: Teresa Vincent Date of Birth: 12-26-1980   Transition of Care [facility]) CM/SW Contact:    Villa Herb, LCSWA Phone Number: 10/31/2021, 11:40 AM    Transition of Care Department [facility]) has reviewed patient and no TOC needs have been identified at this time. We will continue to monitor patient advancement through interdisciplinary progression rounds. If new patient transition needs arise, please place a TOC consult.

## 2021-10-31 NOTE — ED Notes (Signed)
Arrived at Ct at this time

## 2021-10-31 NOTE — H&P (Signed)
History and Physical   Patient: Teresa Vincent                            PCP: Teresa Sams, DO                    DOB: 03/07/1981            DOA: 10/31/2021 [id number]:[id number]             DOS: 10/31/2021, 11:28 AM  Teresa Sams, DO  Patient coming from:   HOME  I have personally reviewed patient's medical records, in electronic medical records, including:  [place] link, and care everywhere.    Chief Complaint:   Chief Complaint  Patient presents with   Dizziness    History of present illness:    Teresa Vincent is a 41 yo woman with hx migraine headaches, chronic back pain, chronic neck pain, HTN who presents with dizziness described as room spinning that started this morning.  Last known well for this was 8:30 AM.  She also has a headache consistent with her history of migraines involving pressure in the right temple that at times radiates to the right neck and shoulder.  The headache did come on after the dizziness today.  She initially reported some slight numbness in her left pinky and ring finger.  But denied of having any focal neurological finding including asymmetric weaknesses, denied having any double vision.  She also states that over the past week she has been having trouble with her words getting jumbled up.  This was a new problem a week ago and is seem to be getting slowly worse since that time.   ED: Blood pressure 118/75, pulse 73, resp. rate 18, height 5\' 7"  (1.702 m), weight 113.4 kg, last menstrual period 02/25/2016, SpO2 96 %. CBC, BMP within normal limits - On examination she did have decreased sensation to light touch more proximally on her left upper extremity as well.  - Outside the TNK window.   - Head CT showed no acute intracranial process (personal review).  NIH stroke scale was 1 for sensory deficit.   Requested by neurologist to be admitted for further stroke work-up    Patient Denies having: Fever, Chills, Cough, SOB, Chest Pain, Abd pain,  N/V/D, dizziness, lightheadedness,  Dysuria, Joint pain, rash, open wounds   Review of Systems: As per HPI, otherwise 10 point review of systems were negative.   ----------------------------------------------------------------------------------------------------------------------  Allergies  Allergen Reactions   Bupivacaine Swelling    Pt received CT contrast and Bupivacaine at the same time. Unsure which one caused the reaction. Dr. 14/10/2015 recommends a 13 hour prep before either one   Contrast Media [Iodinated Contrast Media] Swelling    Pt received CT contrast and Bupivacaine at the same time. Unsure which one caused the reaction. Dr. Mosetta Putt recommends a 13 hour prep before either one   Indomethacin Other (See Comments)    Dizziness, near syncope, bp went up and BG went down.   Adhesive [Tape] Other (See Comments)    Skin irritation, patient prefers paper tape.    Celexa [Citalopram Hydrobromide] Other (See Comments)    Make patient too carefree    Home MEDs:  Prior to Admission medications   Medication Sig Start Date End Date Taking? Authorizing Provider  albuterol (VENTOLIN HFA) 108 (90 Base) MCG/ACT inhaler Inhale 1-2 puffs into the lungs every 4 (four) hours as  needed for wheezing or shortness of breath. 08/29/21   Teresa Sams, DO  atorvastatin (LIPITOR) 20 MG tablet Take 1 tablet by mouth once daily 10/28/21   Teresa Sams, DO  Cholecalciferol 50 MCG (2000 UT) CAPS Take by mouth.    [provider]  clonazePAM (KLONOPIN) 0.5 MG tablet Take 1/2 tab p.o. qhs for insomnia. 08/29/21   Teresa Sams, DO  cyclobenzaprine (FLEXERIL) 10 MG tablet Take 1 tablet (10 mg total) by mouth 3 (three) times daily as needed for muscle spasms. 08/29/21   Teresa Sams, DO  levothyroxine (SYNTHROID) 175 MCG tablet TAKE 1 TABLET BY MOUTH ONCE DAILY BEFORE BREAKFAST, TAKE AN EXTRA TABLET ONE DAY A  WEEK 09/23/21   Nida, Denman George, MD  lisinopril (ZESTRIL) 20 MG tablet Take 1 tablet (20 mg  total) by mouth daily. 09/10/20   Laroy Apple M, DO  metoprolol succinate (TOPROL-XL) 50 MG 24 hr tablet TAKE 1 TABLET BY MOUTH ONCE DAILY WITH OR IMMEDIATELY FOLLOWING A MEAL 07/18/21   Cook, Jayce G, DO  mirabegron ER (MYRBETRIQ) 25 MG TB24 tablet Take 1 tablet (25 mg total) by mouth daily. 09/07/21   McKenzie, Mardene Celeste, MD  omeprazole (PRILOSEC) 40 MG capsule Take by mouth. 11/13/20   [provider]  ondansetron (ZOFRAN-ODT) 4 MG disintegrating tablet DISSOLVE 1 TABLET IN MOUTH EVERY 8 HOURS AS NEEDED 05/19/21   Teresa Sams, DO  oxyCODONE-acetaminophen (PERCOCET) 5-325 MG tablet Take 1 tablet by mouth 2 (two) times daily as needed for moderate pain. 10/24/21 10/24/22  Horton Chin, MD  SUMAtriptan (IMITREX) 100 MG tablet 1 tablet at the first sign of migraine. Additional dose in 2 hours if headache persists or recurs. 02/28/21   Teresa Sams, DO    PRN MEDs: acetaminophen **OR** acetaminophen (TYLENOL) oral liquid 160 mg/5 mL **OR** acetaminophen, senna-docusate  Past Medical History:  Diagnosis Date   Abnormal Pap smear of cervix    ANXIETY 08/30/2007   Asthma    Chronic back pain    Claustrophobia    pt requests Versed before surgery.   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    Past Surgical History:  Procedure Laterality Date   ABDOMINAL EXPOSURE N/A 05/29/2017   Procedure: ABDOMINAL EXPOSURE;  Surgeon: Chuck Hint, MD;  Location: [facility] OR;  Service: Vascular;  Laterality: N/A;   ABDOMINAL HYSTERECTOMY     ANTERIOR LUMBAR FUSION N/A 05/29/2017   Procedure: Anterior Lumbar Interbody Fusion  -  Lumbar five sacral one;  Surgeon: Tia Alert, MD;  Location: [facility] OR;  Service: Neurosurgery;  Laterality: N/A;   BIOPSY  02/17/2020   Procedure: BIOPSY;  Surgeon: Lanelle Bal, DO;  Location: [facility];  Service: Endoscopy;;  gastric   CARPAL TUNNEL RELEASE Bilateral    CHOLECYSTECTOMY     CHOLECYSTECTOMY N/A 12/29/2016   Procedure: LAPAROSCOPIC CHOLECYSTECTOMY;  Surgeon: [name], MD;  Location: [facility] ORS;  Service: General;  Laterality: N/A;   COLONOSCOPY WITH PROPOFOL N/A 02/17/2020  non-bleeding internal hemorrhoids, otherwise normal.   CYSTOSCOPY/URETEROSCOPY/HOLMIUM LASER/STENT PLACEMENT Right 06/25/2019   Procedure: CYSTOSCOPY WITH RIGHT RETROGRADE PYELOGRAM/RIGHT URETEROSCOPY/HOLMIUM LASER APPLICATION RIGHT URETERAL CALCULUS/RIGHT URETERAL STENT PLACEMENT;  Surgeon: Malen Gauze, MD;  Location: [facility];  Service: Urology;  Laterality: Right;   ESOPHAGOGASTRODUODENOSCOPY (EGD) WITH PROPOFOL N/A 02/17/2020   Procedure: ESOPHAGOGASTRODUODENOSCOPY (EGD) WITH PROPOFOL;  Surgeon: Lanelle Bal, DO;  Location: [facility];  Service: Endoscopy;  Laterality: N/A;   EXTRACORPOREAL SHOCK WAVE LITHOTRIPSY Right 04/10/2019   Procedure: EXTRACORPOREAL SHOCK WAVE LITHOTRIPSY (ESWL);  Surgeon: Noel Christmas, MD;  Location: [facility];  Service: Urology;  Laterality: Right;   LAMINECTOMY WITH POSTERIOR LATERAL ARTHRODESIS LEVEL 1 N/A 02/28/2018   Procedure: Laminectomy and Foraminotomy Lumbar Five-Sacral One, posterolateral fusion and fixation;  Surgeon: Tia Alert, MD;  Location: [facility] OR;  Service: Neurosurgery;  Laterality: N/A;  posterior   LUMBAR EPIDURAL INJECTION     STERIOD INJECTION N/A 01/10/2017   Procedure: MINOR EXPAREL INJECTION;  Surgeon: [name], MD;  Location: [facility];  Service: General;  Laterality: N/A;   TOTAL ABDOMINAL HYSTERECTOMY W/ BILATERAL SALPINGOOPHORECTOMY N/A    TUBAL LIGATION  2003     reports that she quit  smoking about 9 months ago. Her smoking use included cigarettes. She has a 26.00 pack-year smoking history. She has quit using smokeless tobacco. She reports current alcohol use. She reports that she does not currently use drugs after having used the following drugs: Marijuana.   Family History  Problem Relation Age of Onset   Hypertension Mother    Thyroid disease Neg Hx     Physical Exam:   Vitals:   10/31/21 0951 10/31/21 1015 10/31/21 1045 10/31/21 1100  BP:  (!) 134/90 119/69 118/75  Pulse:  75 69 73  Resp:  20 15 18   SpO2:  99% 97% 96%  Weight: 113.4 kg     Height: 5\' 7"  (1.702 m)      Constitutional: NAD, calm, comfortable complaining of headaches Eyes: PERRL, lids and conjunctivae normal ENMT: Mucous membranes are moist. Posterior pharynx clear of any exudate or lesions.Normal dentition.  Neck: normal, supple, no masses, no thyromegaly Respiratory: clear to auscultation bilaterally, no wheezing, no crackles. Normal respiratory effort. No accessory muscle use.  Cardiovascular: Regular rate and rhythm, no murmurs / rubs / gallops. No extremity edema. 2+ pedal pulses. No carotid bruits.  Abdomen: no tenderness, no masses palpated. No hepatosplenomegaly. Bowel sounds positive.  Musculoskeletal: no clubbing / cyanosis. No joint deformity upper and lower extremities. Good ROM, no contractures. Normal muscle tone.  Neurologic: decreased sensation to light touch more proximally on her left upper extremity as well. CN II-XII grossly intact. Sensation intact, DTR normal. Strength 5/5 in all 4.  Psychiatric: Normal judgment and insight. Alert and oriented x 3. Normal mood.  Skin: no rashes, lesions, ulcers. No induration Decubitus/ulcers:  Wounds: per nursing documentation         Labs on admission:    I have personally reviewed following labs and imaging studies  CBC: Recent Labs  Lab 10/31/21 0953 10/31/21 0958  WBC 8.2  --   NEUTROABS 5.1  --   HGB 13.7 14.3  HCT  40.4 42.0  MCV 90.8  --   PLT 268  --    Basic Metabolic Panel: Recent Labs  Lab 10/31/21 0953 10/31/21 0958  NA 137 139  K 3.6 3.5  CL 104 102  CO2 26  --   GLUCOSE 131*  129*  BUN 13 12  CREATININE 0.85 0.90  CALCIUM 9.1  --    GFR: Estimated Creatinine Clearance: 106.9 mL/min (by C-G formula based on SCr of 0.9 mg/dL). Liver Function Tests: Recent Labs  Lab 10/31/21 0953  AST 21  ALT 21  ALKPHOS 64  BILITOT 0.2*  PROT 7.8  ALBUMIN 4.2   No results for input(s): "LIPASE", "AMYLASE" in the last 168 hours. No results for input(s): "AMMONIA" in the last 168 hours. Coagulation Profile: Recent Labs  Lab 10/31/21 0953  INR 1.0    CBG: Recent Labs  Lab 10/31/21 0950  GLUCAP 124*    Urine analysis:    Component Value Date/Time   COLORURINE RED (A) 06/26/2019 1051   APPEARANCEUR Clear 09/07/2021 1453   LABSPEC  06/26/2019 1051    TEST NOT REPORTED DUE TO COLOR INTERFERENCE OF URINE PIGMENT   PHURINE  06/26/2019 1051    TEST NOT REPORTED DUE TO COLOR INTERFERENCE OF URINE PIGMENT   GLUCOSEU Negative 09/07/2021 1453   HGBUR (A) 06/26/2019 1051    TEST NOT REPORTED DUE TO COLOR INTERFERENCE OF URINE PIGMENT   BILIRUBINUR Negative 09/07/2021 1453   KETONESUR (A) 06/26/2019 1051    TEST NOT REPORTED DUE TO COLOR INTERFERENCE OF URINE PIGMENT   PROTEINUR Negative 09/07/2021 1453   PROTEINUR (A) 06/26/2019 1051    TEST NOT REPORTED DUE TO COLOR INTERFERENCE OF URINE PIGMENT   UROBILINOGEN 0.2 06/04/2019 1102   UROBILINOGEN 0.2 06/18/2014 2205   NITRITE Negative 09/07/2021 1453   NITRITE (A) 06/26/2019 1051    TEST NOT REPORTED DUE TO COLOR INTERFERENCE OF URINE PIGMENT   LEUKOCYTESUR Negative 09/07/2021 1453   LEUKOCYTESUR (A) 06/26/2019 1051    TEST NOT REPORTED DUE TO COLOR INTERFERENCE OF URINE PIGMENT    Last A1C:  Lab Results  Component Value Date   HGBA1C 6.0 05/02/2021     Radiologic Exams on Admission:   CT HEAD CODE STROKE WO  CONTRAST  Result Date: 10/31/2021 CLINICAL DATA:  Code stroke.  Numbness in left hand EXAM: CT HEAD WITHOUT CONTRAST TECHNIQUE: Contiguous axial images were obtained from the base of the skull through the vertex without intravenous contrast. RADIATION DOSE REDUCTION: This exam was performed according to the departmental dose-optimization program which includes automated exposure control, adjustment of the mA and/or kV according to patient size and/or use of iterative reconstruction technique. COMPARISON:  None Available. FINDINGS: Brain: There is no acute intracranial hemorrhage, extra-axial fluid collection, or acute infarct. Parenchymal volume is normal. The ventricles are normal in size. Gray-white differentiation is preserved There is no mass lesion.  There is no mass effect or midline shift. Vascular: Choose Skull: Normal. Negative for fracture or focal lesion. Sinuses/Orbits: The paranasal sinuses are clear. The globes and orbits are unremarkable. Other: None. ASPECTS [facility] Stroke Program Early CT Score) - Ganglionic level infarction (caudate, lentiform nuclei, internal capsule, insula, M1-M3 cortex): 7 - Supraganglionic infarction (M4-M6 cortex): 3 Total score (0-10 with 10 being normal): 10 IMPRESSION: 1. No acute intracranial pathology. 2. ASPECTS is 10 Electronically Signed   By: Lesia Hausen M.D.   On: 10/31/2021 10:05    EKG:   Independently reviewed.  Orders placed or performed during the hospital encounter of 10/31/21   ED EKG   ED EKG   EKG 12-Lead   EKG 12-Lead   ---------------------------------------------------------------------------------------------------------------------------------------    Assessment / Plan:   Principal Problem:   Stroke [facility]) Active Problems:   Migraine headache   Hypothyroidism following  radioiodine therapy   Anxiety state   Essential hypertension, benign   Class 2 severe obesity due to excess calories with serious comorbidity and body mass  index (BMI) of 36.0 to 36.9 in adult [facility](HCC)   Mixed hyperlipidemia   Assessment and Plan: * Stroke (HCC) -Possible subjective finding of decreased sensation to light touch more proximally on her left upper extremity as well.  -No other focal neurological findings aside from migraine headaches -CT of the head negative -Medical telemetry floor -Teleneurology consulted..  Continue stroke work-up -Neurochecks, PT/OT/speech evaluation -2D echocardiogram -MRA/MRI of the head -Labs including A1c and LDL -Apparently allergic to aspirin, neurology initiated Plavix -Continue home medication of atorvastatin  Migraine headache - In ED patient treated with a cocktail including Tylenol, Reglan, Toradol and Benadryl -We will monitor closely, as needed analgesics  Mixed hyperlipidemia - Continue current dose statins -Back in fasting lipid panel  Class 2 severe obesity due to excess calories with serious comorbidity and body mass index (BMI) of 36.0 to 36.9 in adult (HCC) Body mass index is 39.16 kg/m. -Discussed with patient regarding healthier diet and exercise, weight loss programs   Essential hypertension, benign - We will holding home medication of lisinopril, and metoprolol for now -Allowing for permissive hypertension  Anxiety state - As needed benzodiazepine, including Klonopin  Hypothyroidism following radioiodine therapy - Continue home medication of Synthroid -Checking TSH      Consults called: Teleneurology -------------------------------------------------------------------------------------------------------------------------------------------- DVT prophylaxis:  SCD's Start: 10/31/21 1113   Code Status:   Code Status: Full Code   Admission status: Patient will be admitted as Observation, with a less than 2 midnight length of stay. Level of care: Telemetry   Family Communication:  none at bedside  (The above findings and plan of care has been discussed with  patient in detail, the patient expressed understanding and agreement of above plan)  --------------------------------------------------------------------------------------------------------------------------------------------------  Disposition Plan:  Anticipated 1-2 days Status is: Observation The patient remains OBS appropriate and will d/c before 2 midnights.     ----------------------------------------------------------------------------------------------------------------------------------------------------  Time spent: > than  55  Min.   SIGNED: Kendell BaneSeyed A Maleena Eddleman, MD, FHM. [facility],  Pager (Please use [email or web address] to page to text)  If 7PM-7AM, please contact night-coverage [email or web address],  10/31/2021, 11:28 AM

## 2021-10-31 NOTE — Assessment & Plan Note (Addendum)
-   Continue home medication of Synthroid -TSH: 40.9  Free T4/T3

## 2021-10-31 NOTE — Consult Note (Signed)
NEUROLOGY TELECONSULTATION NOTE   Date of service: October 31, 2021 Patient Name: Teresa Vincent MRN:  [id number] DOB:  October 28, 1980 Reason for consult: telestroke  Requesting Provider: Dr. Estell Harpin Consult Participants: myself, bedside RN, telestroke RN Location of the provider: [facility] Location of the patient: [facility]  This consult was provided via telemedicine with 2-way video and audio communication. The patient/family was informed that care would be provided in this way and agreed to receive care in this manner.   _ _ _   _ __   _ __ _ _  __ __   _ __   __ _  History of Present Illness   41 yo woman with hx migraine headaches, chronic back pain, chronic neck pain, HTN who presents with dizziness described as room spinning that started this morning.  Last known well for this was 8:30 AM.  She also has a headache consistent with her history of migraines involving pressure in the right temple that at times radiates to the right neck and shoulder.  The headache did come on after the dizziness today.  She initially reported some slight numbness in her left pinky and ring finger.  On examination she did have decreased sensation to light touch more proximally on her left upper extremity as well.  She also states that over the past week she has been having trouble with her words getting jumbled up.  This was a new problem a week ago and is seem to be getting slowly worse since that time.  As a result she was felt to be outside the TNK window.  Head CT showed no acute intracranial process (personal review).  NIH stroke scale was 1 for sensory deficit.  Vascular imaging was not performed as part of the stroke code due to examination not being consistent with LVO.   ROS   Per HPI; all other systems reviewed and are negative  Past History   The following was personally reviewed:  Past Medical History:  Diagnosis Date   Abnormal Pap smear of cervix    ANXIETY 08/30/2007   Asthma    Chronic back  pain    Claustrophobia    pt requests Versed before surgery.   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   Past Surgical History:  Procedure Laterality Date   ABDOMINAL EXPOSURE N/A 05/29/2017   Procedure: ABDOMINAL EXPOSURE;  Surgeon: Chuck Hint, MD;  Location: [facility];  Service: Vascular;  Laterality: N/A;   ABDOMINAL HYSTERECTOMY     ANTERIOR LUMBAR FUSION N/A 05/29/2017   Procedure: Anterior Lumbar Interbody Fusion  - Lumbar five sacral one;  Surgeon: Tia Alert, MD;  Location: [facility] OR;  Service: Neurosurgery;  Laterality: N/A;   BIOPSY  02/17/2020   Procedure: BIOPSY;  Surgeon: Lanelle Bal, DO;  Location: [facility];  Service: Endoscopy;;  gastric   CARPAL TUNNEL RELEASE Bilateral    CHOLECYSTECTOMY     CHOLECYSTECTOMY N/A 12/29/2016   Procedure: LAPAROSCOPIC CHOLECYSTECTOMY;  Surgeon: [name], MD;  Location: [facility];  Service: General;  Laterality: N/A;   COLONOSCOPY WITH PROPOFOL N/A 02/17/2020   non-bleeding internal hemorrhoids, otherwise normal.   CYSTOSCOPY/URETEROSCOPY/HOLMIUM LASER/STENT PLACEMENT Right 06/25/2019   Procedure: CYSTOSCOPY WITH RIGHT RETROGRADE PYELOGRAM/RIGHT URETEROSCOPY/HOLMIUM LASER APPLICATION RIGHT URETERAL CALCULUS/RIGHT URETERAL STENT PLACEMENT;  Surgeon: Malen Gauze, MD;  Location: [facility];  Service: Urology;  Laterality: Right;   ESOPHAGOGASTRODUODENOSCOPY (EGD) WITH PROPOFOL N/A 02/17/2020   Procedure: ESOPHAGOGASTRODUODENOSCOPY (EGD) WITH PROPOFOL;  Surgeon: Lanelle Bal, DO;  Location: [facility];  Service: Endoscopy;  Laterality: N/A;    EXTRACORPOREAL SHOCK WAVE LITHOTRIPSY Right 04/10/2019   Procedure: EXTRACORPOREAL SHOCK WAVE LITHOTRIPSY (ESWL);  Surgeon: Noel Christmas, MD;  Location: [facility];  Service: Urology;  Laterality: Right;   LAMINECTOMY WITH POSTERIOR LATERAL ARTHRODESIS LEVEL 1 N/A 02/28/2018   Procedure: Laminectomy and Foraminotomy Lumbar Five-Sacral One, posterolateral fusion and fixation;  Surgeon: Tia Alert, MD;  Location: [facility] OR;  Service: Neurosurgery;  Laterality: N/A;  posterior   LUMBAR EPIDURAL INJECTION     STERIOD INJECTION N/A 01/10/2017   Procedure: MINOR EXPAREL INJECTION;  Surgeon: [name], MD;  Location: [facility];  Service: General;  Laterality: N/A;   TOTAL ABDOMINAL HYSTERECTOMY W/ BILATERAL SALPINGOOPHORECTOMY N/A    TUBAL LIGATION  2003   Family History  Problem Relation Age of Onset   Hypertension Mother    Thyroid disease Neg Hx    Social History   Socioeconomic History   Marital status: Married    Spouse name: Not on file   Number of children: Not on file   Years of education: Not on file   Highest education level: Not on file  Occupational History   Occupation: Doesn't work outside the home  Tobacco Use   Smoking status: Former    Packs/day: 1.00    Years: 26.00    Total pack years: 26.00    Types: Cigarettes    Quit date: 01/07/2021    Years since quitting: 0.8   Smokeless tobacco: Former  Building services engineer Use: Every day   Substances: Nicotine, Flavoring  Substance and Sexual Activity   Alcohol use: Yes    Alcohol/week: 0.0 standard drinks of alcohol    Comment: occasional   Drug use: Not Currently    Types: Marijuana    Comment: occasional   Sexual activity: Not Currently    Birth control/protection: Surgical  Other Topics Concern   Not on file  Social History Narrative   Not on file   Social Determinants of Health   Financial Resource Strain: Not on file  Food Insecurity: Not on file  Transportation Needs: Not on  file  Physical Activity: Not on file  Stress: Not on file  Social Connections: Not on file   Allergies  Allergen Reactions   Bupivacaine Swelling    Pt received CT contrast and Bupivacaine at the same time. Unsure which one caused the reaction. Dr. Mosetta Putt recommends a 13 hour prep before either one   Contrast Media [Iodinated Contrast Media] Swelling    Pt received CT contrast and Bupivacaine at the same time. Unsure which one caused the reaction. Dr. Mosetta Putt recommends a 13 hour prep before either one   Indomethacin Other (See Comments)    Dizziness, near syncope, bp went up and BG went down.   Adhesive [Tape] Other (See Comments)    Skin irritation, patient prefers paper tape.    Celexa [Citalopram Hydrobromide] Other (See Comments)  Make patient too carefree    Medications   (Not in a hospital admission)    No current facility-administered medications for this encounter.  Current Outpatient Medications:    albuterol (VENTOLIN HFA) 108 (90 Base) MCG/ACT inhaler, Inhale 1-2 puffs into the lungs every 4 (four) hours as needed for wheezing or shortness of breath., Disp: 18 g, Rfl: 3   atorvastatin (LIPITOR) 20 MG tablet, Take 1 tablet by mouth once daily, Disp: 90 tablet, Rfl: 0   Cholecalciferol 50 MCG (2000 UT) CAPS, Take by mouth., Disp: , Rfl:    clonazePAM (KLONOPIN) 0.5 MG tablet, Take 1/2 tab p.o. qhs for insomnia., Disp: 30 tablet, Rfl: 1   cyclobenzaprine (FLEXERIL) 10 MG tablet, Take 1 tablet (10 mg total) by mouth 3 (three) times daily as needed for muscle spasms., Disp: 90 tablet, Rfl: 1   levothyroxine (SYNTHROID) 175 MCG tablet, TAKE 1 TABLET BY MOUTH ONCE DAILY BEFORE BREAKFAST, TAKE AN EXTRA TABLET ONE DAY A  WEEK, Disp: 95 tablet, Rfl: 0   lisinopril (ZESTRIL) 20 MG tablet, Take 1 tablet (20 mg total) by mouth daily., Disp: 90 tablet, Rfl: 1   metoprolol succinate (TOPROL-XL) 50 MG 24 hr tablet, TAKE 1 TABLET BY MOUTH ONCE DAILY WITH OR IMMEDIATELY FOLLOWING A MEAL,  Disp: 90 tablet, Rfl: 0   mirabegron ER (MYRBETRIQ) 25 MG TB24 tablet, Take 1 tablet (25 mg total) by mouth daily., Disp: 30 tablet, Rfl: 0   omeprazole (PRILOSEC) 40 MG capsule, Take by mouth., Disp: , Rfl:    ondansetron (ZOFRAN-ODT) 4 MG disintegrating tablet, DISSOLVE 1 TABLET IN MOUTH EVERY 8 HOURS AS NEEDED, Disp: 30 tablet, Rfl: 0   oxyCODONE-acetaminophen (PERCOCET) 5-325 MG tablet, Take 1 tablet by mouth 2 (two) times daily as needed for moderate pain., Disp: 60 tablet, Rfl: 0   SUMAtriptan (IMITREX) 100 MG tablet, 1 tablet at the first sign of migraine. Additional dose in 2 hours if headache persists or recurs., Disp: 10 tablet, Rfl: 1  Vitals   Vitals:   10/31/21 0950 10/31/21 0951  BP: (!) 155/105   Pulse: 84   Resp: 11   SpO2: 100%   Weight:  113.4 kg  Height:  5\' 7"  (1.702 m)     Body mass index is 39.16 kg/m.  Physical Exam   Exam performed over telemedicine with 2-way video and audio communication and with assistance of bedside RN  Physical Exam Gen: A&O x4, NAD Resp: normal WOB CV: extremities appear well-perfused  Neuro: *MS: A&O x4. Follows multi-step commands.  *Speech: nondysarthric, no aphasia, able to name and repeat *CN: PERRL 33mm, EOMI, VFF by confrontation, sensation intact, smile symmetric, hearing intact to voice *Motor:   Normal bulk.  No tremor, rigidity or bradykinesia. No pronator drift. Extremities 5/5.  *Sensory: Slight impairment to LT in LUE others SILT *Coordination: FNF intact bilat *Reflexes:  UTA 2/2 tele-exam *Gait: at baseline per patient (examiner watched her walk across the room with RN assistance on video   NIHSS  1a Level of Conscious.: 0 1b LOC Questions: 0 1c LOC Commands: 0 2 Best Gaze: 0 3 Visual: 0 4 Facial Palsy: 0 5a Motor Arm - left: 0 5b Motor Arm - Right: 0 6a Motor Leg - Left: 0 6b Motor Leg - Right: 0 7 Limb Ataxia: 0 8 Sensory: 1 9 Best Language: 0 10 Dysarthria: 0 11 Extinct. and Inatten.:  0  TOTAL: 1   Premorbid mRS = 0   Labs   CBC:  Recent  Labs  Lab 10/31/21 0953 10/31/21 0958  WBC 8.2  --   NEUTROABS 5.1  --   HGB 13.7 14.3  HCT 40.4 42.0  MCV 90.8  --   PLT 268  --     Basic Metabolic Panel:  Lab Results  Component Value Date   NA 139 10/31/2021   K 3.5 10/31/2021   CO2 23 12/28/2020   GLUCOSE 129 (H) 10/31/2021   BUN 12 10/31/2021   CREATININE 0.90 10/31/2021   CALCIUM 9.7 12/28/2020   GFRNONAA >60 06/26/2019   GFRAA >60 06/26/2019   Lipid Panel:  Lab Results  Component Value Date   LDLCALC 98 04/23/2020   HgbA1c:  Lab Results  Component Value Date   HGBA1C 6.0 05/02/2021   Urine Drug Screen:     Component Value Date/Time   LABOPIA NONE DETECTED 06/18/2014 2205   COCAINSCRNUR NONE DETECTED 06/18/2014 2205   LABBENZ NONE DETECTED 06/18/2014 2205   AMPHETMU NONE DETECTED 06/18/2014 2205   THCU NONE DETECTED 06/18/2014 2205   LABBARB NONE DETECTED 06/18/2014 2205    Alcohol Level No results found for: "ETH"   Impression   41 yo woman with hx migraine headaches, chronic back pain, chronic neck pain, HTN who presents with dizziness described as room spinning that started this morning.  NIHSS = 1. Head CT NAICP. She also reported word finding difficulty over the past week therefore she was thought to be outside the window for TNK. Ddx for vertigo is central or peripheral, or possibly 2/2 migraine.    Recommendations   - Admit for stroke workup (can admit to [facility]) - Migraine cocktail in ED, even if sx improve with tx of headache she should still be admitted for stroke/TIA workup - Permissive HTN x48 hrs from sx onset or until stroke ruled out by MRI goal BP <220/110. PRN labetalol or hydralazine if BP above these parameters. Avoid oral antihypertensives. - MRI brain wo contrast - MRA H&N wo contrast  -  Please not patient is allergic to iodinated contrast - TTE w/ bubble - Check A1c and LDL + add statin per guidelines -  Plavix 75mg  daily (pt has listed allergy to ASA) - q4 hr neuro checks - STAT head CT for any change in neuro exam - Tele - PT/OT/SLP - Stroke education - I have placed relevant orders for stroke workup. If any further neurologic concerns arise after today please place a routine teleconsult to Dr. (in [facility]) between 8am-4pm M-F. - Amb referral to neurology upon discharge   ______________________________________________________________________   Thank you for the opportunity to take part in the care of this patient. If you have any further questions, please contact the neurology consultation attending.  Signed,  [facility], MD [facility] [phone]  If 7pm- 7am, please page neurology on call as listed in AMION.

## 2021-10-31 NOTE — Progress Notes (Signed)
Telestroke RN Note  [id number]: Code stroke activated via cart.  LWK 0830 MRS 0  0955: Pt taken to CT.   1005: Pt back in room from CT.  1011: Dr. Selina Cooley virtually present and assessing patient. Notified Dr. Selina Cooley of CT head results.   1025: Signed off cart.

## 2021-10-31 NOTE — ED Notes (Signed)
Returned from CT at this time.

## 2021-11-01 ENCOUNTER — Ambulatory Visit: Payer: Medicaid Other | Admitting: "Endocrinology

## 2021-11-01 DIAGNOSIS — E782 Mixed hyperlipidemia: Secondary | ICD-10-CM | POA: Diagnosis not present

## 2021-11-01 DIAGNOSIS — Z6836 Body mass index (BMI) 36.0-36.9, adult: Secondary | ICD-10-CM | POA: Diagnosis not present

## 2021-11-01 DIAGNOSIS — E890 Postprocedural hypothyroidism: Secondary | ICD-10-CM | POA: Diagnosis not present

## 2021-11-01 DIAGNOSIS — R29701 NIHSS score 1: Secondary | ICD-10-CM | POA: Diagnosis not present

## 2021-11-01 DIAGNOSIS — I10 Essential (primary) hypertension: Secondary | ICD-10-CM | POA: Diagnosis not present

## 2021-11-01 DIAGNOSIS — G459 Transient cerebral ischemic attack, unspecified: Secondary | ICD-10-CM | POA: Diagnosis present

## 2021-11-01 DIAGNOSIS — I639 Cerebral infarction, unspecified: Secondary | ICD-10-CM | POA: Diagnosis not present

## 2021-11-01 DIAGNOSIS — Z87891 Personal history of nicotine dependence: Secondary | ICD-10-CM | POA: Diagnosis not present

## 2021-11-01 DIAGNOSIS — F411 Generalized anxiety disorder: Secondary | ICD-10-CM | POA: Diagnosis not present

## 2021-11-01 DIAGNOSIS — J45909 Unspecified asthma, uncomplicated: Secondary | ICD-10-CM | POA: Diagnosis not present

## 2021-11-01 DIAGNOSIS — R42 Dizziness and giddiness: Secondary | ICD-10-CM | POA: Diagnosis not present

## 2021-11-01 DIAGNOSIS — Z79899 Other long term (current) drug therapy: Secondary | ICD-10-CM | POA: Diagnosis not present

## 2021-11-01 DIAGNOSIS — Z20822 Contact with and (suspected) exposure to covid-19: Secondary | ICD-10-CM | POA: Diagnosis not present

## 2021-11-01 LAB — T4, FREE: Free T4: 0.63 ng/dL (ref 0.61–1.12)

## 2021-11-01 MED ORDER — CLOPIDOGREL BISULFATE 75 MG PO TABS: 75.0000 mg | ORAL_TABLET | Freq: Every day | ORAL | 0 refills | Status: AC

## 2021-11-01 NOTE — Evaluation (Signed)
Physical Therapy Evaluation Patient Details Name: Teresa Vincent MRN: [id number] DOB: January 17, 1981 Today's Date: 11/01/2021  History of Present Illness  Teresa Vincent is a 41 yo woman with hx migraine headaches, chronic back pain, chronic neck pain, HTN who presents with dizziness described as room spinning that started this morning.  Last known well for this was 8:30 AM.  She also has a headache consistent with her history of migraines involving pressure in the right temple that at times radiates to the right neck and shoulder.  The headache did come on after the dizziness today.  She initially reported some slight numbness in her left pinky and ring finger.  But denied of having any focal neurological finding including asymmetric weaknesses, denied having any double vision.     She also states that over the past week she has been having trouble with her words getting jumbled up.  This was a new problem a week ago and is seem to be getting slowly worse since that time.   Clinical Impression  Patient functioning near baseline for functional mobility and gait demonstrating good return for bed mobility, transfers and ambulation on level, inclined and declined surfaces without loss of balance.  Plan:  Patient discharged from physical therapy to care of nursing for ambulation daily as tolerated for length of stay.         Recommendations for follow up therapy are one component of a multi-disciplinary discharge planning process, led by the attending physician.  Recommendations may be updated based on patient status, additional functional criteria and insurance authorization.  Follow Up Recommendations No PT follow up      Assistance Recommended at Discharge PRN  Patient can return home with the following  Other (comment) (near baseline)    Equipment Recommendations None recommended by PT  Recommendations for Other Services       Functional Status Assessment       Precautions /  Restrictions Precautions Precautions: None Restrictions Weight Bearing Restrictions: No      Mobility  Bed Mobility Overal bed mobility: Independent                  Transfers Overall transfer level: Independent                      Ambulation/Gait Ambulation/Gait assistance: Modified independent (Device/Increase time), Independent Gait Distance (Feet): 200 Feet Assistive device: None Gait Pattern/deviations: WFL(Within Functional Limits) Gait velocity: near normal     General Gait Details: demonstrates good return for ambulation on level, inclined and declined surfaces without loss of balance  Stairs            Wheelchair Mobility    Modified Rankin (Stroke Patients Only)       Balance Overall balance assessment: No apparent balance deficits (not formally assessed)                                           Pertinent Vitals/Pain Pain Assessment Pain Assessment: No/denies pain    Home Living Family/patient expects to be discharged to:: Private residence Living Arrangements: Spouse/significant other Available Help at Discharge: Family;Available PRN/intermittently Type of Home: House Home Access: Stairs to enter Entrance Stairs-Rails: Left Entrance Stairs-Number of Steps: 2   Home Layout: One level Home Equipment: Agricultural consultant (2 wheels);Cane - single point;BSC/3in1      Prior Function Prior Level of  Function : Independent/Modified Independent             Mobility Comments: independent ADLs Comments: independent     Hand Dominance   Dominant Hand: Right    Extremity/Trunk Assessment   Upper Extremity Assessment Upper Extremity Assessment: Defer to OT evaluation    Lower Extremity Assessment Lower Extremity Assessment: Overall WFL for tasks assessed    Cervical / Trunk Assessment Cervical / Trunk Assessment: Normal  Communication   Communication: No difficulties  Cognition Arousal/Alertness:  Awake/alert Behavior During Therapy: WFL for tasks assessed/performed Overall Cognitive Status: Within Functional Limits for tasks assessed                                          General Comments      Exercises     Assessment/Plan    PT Assessment Patient does not need any further PT services  PT Problem List         PT Treatment Interventions      PT Goals (Current goals can be found in the Care Plan section)  Acute Rehab PT Goals Patient Stated Goal: return home with family to assist PT Goal Formulation: With patient Time For Goal Achievement: 11/01/21 Potential to Achieve Goals: Good    Frequency       Co-evaluation PT/OT/SLP Co-Evaluation/Treatment: Yes Reason for Co-Treatment: To address functional/ADL transfers PT goals addressed during session: Mobility/safety with mobility;Balance OT goals addressed during session: ADL's and self-care       AM-PAC PT "6 Clicks" Mobility  Outcome Measure Help needed turning from your back to your side while in a flat bed without using bedrails?: None Help needed moving from lying on your back to sitting on the side of a flat bed without using bedrails?: None Help needed moving to and from a bed to a chair (including a wheelchair)?: None Help needed standing up from a chair using your arms (e.g., wheelchair or bedside chair)?: None Help needed to walk in hospital room?: None Help needed climbing 3-5 steps with a railing? : None 6 Click Score: 24    End of Session   Activity Tolerance: Patient tolerated treatment well Patient left: in bed   PT Visit Diagnosis: Unsteadiness on feet (R26.81);Other abnormalities of gait and mobility (R26.89);Muscle weakness (generalized) (M62.81)    Time: 4259-5638 PT Time Calculation (min) (ACUTE ONLY): 20 min   Charges:   PT Evaluation $PT Eval Moderate Complexity: 1 Mod PT Treatments $Therapeutic Activity: 8-22 mins        10:42 AM, 11/01/21 Teresa Vincent,  MPT Physical Therapist with [facility] 336 [phone] office [phone] mobile phone

## 2021-11-01 NOTE — Evaluation (Signed)
Occupational Therapy Evaluation Patient Details Name: Teresa Vincent MRN: [id number] DOB: [birth year] Today's Date: 11/01/2021   History of Present Illness Teresa Vincent is a 41 yo woman with hx migraine headaches, chronic back pain, chronic neck pain, HTN who presents with dizziness described as room spinning that started this morning.  Last known well for this was 8:30 AM.  She also has a headache consistent with her history of migraines involving pressure in the right temple that at times radiates to the right neck and shoulder.  The headache did come on after the dizziness today.  She initially reported some slight numbness in her left pinky and ring finger.  But denied of having any focal neurological finding including asymmetric weaknesses, denied having any double vision.     She also states that over the past week she has been having trouble with her words getting jumbled up.  This was a new problem a week ago and is seem to be getting slowly worse since that time. (per MD)   Clinical Impression   Pt reports that symptoms have resolved. Pt appears to be at or near baseline with independent bed mobility and functional transfers. Pt demonstrates good B UE strength and no visual deficits. Pt will benefit from continued OT in the hospital and recommended venue below to increase strength, balance, and endurance for safe ADL's.         Recommendations for follow up therapy are one component of a multi-disciplinary discharge planning process, led by the attending physician.  Recommendations may be updated based on patient status, additional functional criteria and insurance authorization.   Follow Up Recommendations  No OT follow up    Assistance Recommended at Discharge None        Functional Status Assessment  Patient has not had a recent decline in their functional status               Precautions / Restrictions Precautions Precautions: None Restrictions Weight Bearing  Restrictions: No      Mobility Bed Mobility Overal bed mobility: Independent                  Transfers Overall transfer level: Independent Equipment used: None               General transfer comment: Ambulatory transfer from bed to hall and back to bed.      Balance Overall balance assessment: Independent                                         ADL either performed or assessed with clinical judgement   ADL Overall ADL's : Independent                                       General ADL Comments: Able to doff and don socks and demonstrate appropriate mobility for indepndent standing if needed for ADL's.     Vision Baseline Vision/History: 1 Wears glasses Ability to See in Adequate Light: 1 Impaired Patient Visual Report: No change from baseline Vision Assessment?: No apparent visual deficits     Perception     Praxis      Pertinent Vitals/Pain Pain Assessment Pain Assessment: No/denies pain     Hand Dominance Right   Extremity/Trunk Assessment Upper Extremity Assessment Upper Extremity  Assessment: Overall WFL for tasks assessed   Lower Extremity Assessment Lower Extremity Assessment: Defer to PT evaluation   Cervical / Trunk Assessment Cervical / Trunk Assessment: Normal   Communication Communication Communication: No difficulties   Cognition Arousal/Alertness: Awake/alert Behavior During Therapy: WFL for tasks assessed/performed Overall Cognitive Status: Within Functional Limits for tasks assessed                                                        Home Living Family/patient expects to be discharged to:: Private residence Living Arrangements: Spouse/significant other Available Help at Discharge: Family;Available PRN/intermittently Type of Home: House Home Access: Stairs to enter [employer] of Steps: 2 Entrance Stairs-Rails: Left (going up) Home Layout: One level      Bathroom Shower/Tub: Chief Strategy Officer: Handicapped height     Home Equipment: Agricultural consultant (2 wheels);Cane - single point;BSC/3in1          Prior Functioning/Environment Prior Level of Function : Independent/Modified Independent             Mobility Comments: independent ADLs Comments: independent                                Co-evaluation PT/OT/SLP Co-Evaluation/Treatment: Yes Reason for Co-Treatment: To address functional/ADL transfers   OT goals addressed during session: ADL's and self-care      AM-PAC OT "6 Clicks" Daily Activity     Outcome Measure Help from another person eating meals?: None Help from another person taking care of personal grooming?: None Help from another person toileting, which includes using toliet, bedpan, or urinal?: None Help from another person bathing (including washing, rinsing, drying)?: None Help from another person to put on and taking off regular upper body clothing?: None Help from another person to put on and taking off regular lower body clothing?: None 6 Click Score: 24   End of Session    Activity Tolerance: Patient tolerated treatment well Patient left: in bed;with call bell/phone within reach  OT Visit Diagnosis: Other symptoms and signs involving the nervous system (Z66.063)                Time: 0160-1093 OT Time Calculation (min): 14 min Charges:  OT General Charges $OT Visit: 1 Visit OT Evaluation $OT Eval Low Complexity: 1 Low  Malick Netz OT, MOT  Danie Chandler 11/01/2021, 8:35 AM

## 2021-11-01 NOTE — Plan of Care (Signed)

## 2021-11-01 NOTE — Discharge Summary (Signed)
Physician Discharge Summary   Patient: Teresa Vincent MRN: [id number] DOB: 07/30/1980  Admit date:     10/31/2021  Discharge date: 11/01/21  Discharge Physician: Kendell Bane   PCP: Tommie Sams, DO   Recommendations at discharge:  Follow-up with PCP within 1 week Follow-up with your endocrinologist next 1-2 days for adjustment of your thyroid medications Neurologist in 2-4 weeks  Discharge Diagnoses: Principal Problem:   Stroke [facility]) Active Problems:   Migraine headache   Hypothyroidism following radioiodine therapy   Anxiety state   Essential hypertension, benign   Class 2 severe obesity due to excess calories with serious comorbidity and body mass index (BMI) of 36.0 to 36.9 in adult [facility])   Mixed hyperlipidemia  Resolved Problems:   * No resolved hospital problems. *  Hospital Course: Teresa Vincent is a 40 yo woman with hx migraine headaches, chronic back pain, chronic neck pain, HTN who presents with dizziness described as room spinning that started this morning.  Last known well for this was 8:30 AM.  She also has a headache consistent with her history of migraines involving pressure in the right temple that at times radiates to the right neck and shoulder.  The headache did come on after the dizziness today.  She initially reported some slight numbness in her left pinky and ring finger.  But denied of having any focal neurological finding including asymmetric weaknesses, denied having any double vision.  She also states that over the past week she has been having trouble with her words getting jumbled up.  This was a new problem a week ago and is seem to be getting slowly worse since that time.   ED: Blood pressure 118/75, pulse 73, resp. rate 18, height  (1.702 m), weight 113.4 kg, last menstrual period 02/25/2016, SpO2 96 %. CBC, BMP within normal limits - On examination she did have decreased sensation to light touch more proximally on her left upper  extremity as well.  - Outside the TNK window.   - Head CT showed no acute intracranial process (personal review).  NIH stroke scale was 1 for sensory deficit.   Requested by neurologist to be admitted for further stroke work-up    Assessment and Plan: * Stroke [facility]) -Acute CVA/stroke was ruled out -Possible TIA, continue Plavix  -Possible subjective finding of decreased sensation to light touch more proximally on her left upper extremity as well.  -No other focal neurological findings aside from migraine headaches -CT of the head negative -MRA MRI of the head was negative -The echocardiogram within normal limits with ejection fraction approximately 60% mild LVH -Teleneurology was consulted..   -Neurochecks, PT/OT/speech evaluation--normal function  -Labs including A1: 5.9  -Apparently allergic to aspirin, neurology initiated Plavix -Continue home medication of atorvastatin  Migraine headache - In ED patient treated with a cocktail including Tylenol, Reglan, Toradol and Benadryl -Migraine headaches resolved, patient advised to continue as needed her Imitrex  Mixed hyperlipidemia - Continue current dose statins   Class 2 severe obesity due to excess calories with serious comorbidity and body mass index (BMI) of 36.0 to 36.9 in adult (HCC) Body mass index is 39.16 kg/m. -Discussed with patient regarding healthier diet and exercise, weight loss programs   Essential hypertension, benign -Resume home medication of lisinopril, and metoprolol   Anxiety state - Continue as needed benzodiazepine, including Klonopin  Hypothyroidism following radioiodine therapy - Continue home medication of Synthroid -TSH: 40.9  Free T4/T3  Consultants: Teleneurology Procedures performed: CT/MRI of the head, 2D echocardiogram Disposition: Home Diet recommendation:  Discharge Diet Orders (From admission, onward)     Start     Ordered   11/01/21 0000  Diet - low sodium heart  healthy        11/01/21 0745           Cardiac and Carb modified diet DISCHARGE MEDICATION: Allergies as of 11/01/2021       Reactions   Bupivacaine Swelling   Pt received CT contrast and Bupivacaine at the same time. Unsure which one caused the reaction. Dr. Mosetta Putt recommends a 13 hour prep before either one   Contrast Media [iodinated Contrast Media] Swelling   Pt received CT contrast and Bupivacaine at the same time. Unsure which one caused the reaction. Dr. Mosetta Putt recommends a 13 hour prep before either one   Indomethacin Other (See Comments)   Dizziness, near syncope, bp went up and BG went down.   Adhesive [tape] Other (See Comments)   Skin irritation, patient prefers paper tape.    Celexa [citalopram Hydrobromide] Other (See Comments)   Make patient too carefree        Medication List     STOP taking these medications    mirabegron ER 25 MG Tb24 tablet Commonly known as: MYRBETRIQ       TAKE these medications    albuterol 108 (90 Base) MCG/ACT inhaler Commonly known as: VENTOLIN HFA Inhale 1-2 puffs into the lungs every 4 (four) hours as needed for wheezing or shortness of breath.   atorvastatin 20 MG tablet Commonly known as: LIPITOR Take 1 tablet by mouth once daily   Cholecalciferol 50 MCG (2000 UT) Caps Take 1 capsule by mouth daily.   clonazePAM 0.5 MG tablet Commonly known as: KLONOPIN Take 1/2 tab p.o. qhs for insomnia. What changed:  how much to take how to take this when to take this additional instructions   clopidogrel 75 MG tablet Commonly known as: PLAVIX Take 1 tablet (75 mg total) by mouth daily.   levothyroxine 175 MCG tablet Commonly known as: SYNTHROID TAKE 1 TABLET BY MOUTH ONCE DAILY BEFORE BREAKFAST, TAKE AN EXTRA TABLET ONE DAY A  WEEK What changed:  how much to take how to take this when to take this additional instructions   lisinopril 20 MG tablet Commonly known as: ZESTRIL Take 1 tablet (20 mg total) by mouth  daily.   metoprolol succinate 50 MG 24 hr tablet Commonly known as: TOPROL-XL TAKE 1 TABLET BY MOUTH ONCE DAILY WITH OR IMMEDIATELY FOLLOWING A MEAL What changed: See the new instructions.   omeprazole 40 MG capsule Commonly known as: PRILOSEC Take 40 mg by mouth daily.   ondansetron 4 MG disintegrating tablet Commonly known as: ZOFRAN-ODT DISSOLVE 1 TABLET IN MOUTH EVERY 8 HOURS AS NEEDED What changed:  how much to take how to take this when to take this reasons to take this additional instructions   oxyCODONE-acetaminophen 5-325 MG tablet Commonly known as: Percocet Take 1 tablet by mouth 2 (two) times daily as needed for moderate pain.   SUMAtriptan 100 MG tablet Commonly known as: Imitrex 1 tablet at the first sign of migraine. Additional dose in 2 hours if headache persists or recurs.        Discharge Exam: Filed Weights   10/31/21 0951  Weight: 113.4 kg      Physical Exam:   General:  AAO x 3,  cooperative, no distress;   HEENT:  Normocephalic,  PERRL, otherwise with in Normal limits   Neuro:  CNII-XII intact. , normal motor and sensation, reflexes intact   Lungs:   Clear to auscultation BL, Respirations unlabored,  No wheezes / crackles  Cardio:    S1/S2, RRR, No murmure, No Rubs or Gallops   Abdomen:  Soft, non-tender, bowel sounds active all four quadrants, no guarding or peritoneal signs.  Muscular  skeletal:  Limited exam -global generalized weaknesses - in bed, able to move all 4 extremities,   2+ pulses,  symmetric, No pitting edema  Skin:  Dry, warm to touch, negative for any Rashes,  Wounds: Please see nursing documentation          Condition at discharge: good  The results of significant diagnostics from this hospitalization (including imaging, microbiology, ancillary and laboratory) are listed below for reference.   Imaging Studies: ECHOCARDIOGRAM COMPLETE BUBBLE STUDY  Result Date: 10/31/2021    ECHOCARDIOGRAM REPORT   Patient  Name:   Teresa Vincent Isidore Date of Exam: 10/31/2021 Medical Rec #:  [id number]           Height:       67.0 in Accession #:    [id number]          Weight:       250.0 lb Date of Birth:  1980-08-04            BSA:          2.223 m Patient Age:    41 years            BP:           121/70 mmHg Patient Gender: F                   HR:           62 bpm. [facility] Procedure: 2D Echo, Cardiac Doppler, Color Doppler and Saline Contrast Bubble            Study Indications:    Stroke  History:        Patient has no prior history of Echocardiogram examinations.                 Stroke; Risk Factors:Hypertension and Dyslipidemia.  Sonographer:    Mikki Harbor Referring Phys: [id number] Malachi Carl STACK IMPRESSIONS  1. Left ventricular ejection fraction, by estimation, is 60 to 65%. The left ventricle has normal function. The left ventricle has no regional wall motion abnormalities. There is mild left ventricular hypertrophy. Left ventricular diastolic parameters were normal.  2. Right ventricular systolic function is normal. The right ventricular size is normal. Tricuspid regurgitation signal is inadequate for assessing PA pressure.  3. Left atrial size was upper normal.  4. The mitral valve is grossly normal. Trivial mitral valve regurgitation.  5. The aortic valve is tricuspid. Aortic valve regurgitation is not visualized.  6. The inferior vena cava is normal in size with greater than 50% respiratory variability, suggesting right atrial pressure of 3 mmHg.  7. Agitated saline contrast bubble study was negative, with no evidence of any interatrial shunt. Comparison(s): No prior Echocardiogram. FINDINGS  Left Ventricle: Left ventricular ejection fraction, by estimation, is 60 to 65%. The left ventricle has normal function. The left ventricle has no regional wall motion abnormalities. The left ventricular internal cavity size was normal in size. There is  mild left ventricular hypertrophy. Left ventricular diastolic  parameters were normal. Right Ventricle: The right ventricular size is normal. No  increase in right ventricular wall thickness. Right ventricular systolic function is normal. Tricuspid regurgitation signal is inadequate for assessing PA pressure. Left Atrium: Left atrial size was upper normal. Right Atrium: Right atrial size was normal in size. Pericardium: There is no evidence of pericardial effusion. Mitral Valve: The mitral valve is grossly normal. Trivial mitral valve regurgitation. MV peak gradient, 5.4 mmHg. The mean mitral valve gradient is 2.0 mmHg. Tricuspid Valve: The tricuspid valve is grossly normal. Tricuspid valve regurgitation is trivial. Aortic Valve: The aortic valve is tricuspid. There is mild aortic valve annular calcification. Aortic valve regurgitation is not visualized. Aortic valve mean gradient measures 5.0 mmHg. Aortic valve peak gradient measures 11.6 mmHg. Aortic valve area, by VTI measures 2.39 cm. Pulmonic Valve: The pulmonic valve was grossly normal. Pulmonic valve regurgitation is trivial. Aorta: The aortic root is normal in size and structure. Venous: The inferior vena cava is normal in size with greater than 50% respiratory variability, suggesting right atrial pressure of 3 mmHg. IAS/Shunts: No atrial level shunt detected by color flow Doppler. Agitated saline contrast was given intravenously to evaluate for intracardiac shunting. Agitated saline contrast bubble study was negative, with no evidence of any interatrial shunt.  LEFT VENTRICLE PLAX 2D LVIDd:         5.50 cm   Diastology LVIDs:         3.20 cm   LV e' medial:    9.79 cm/s LV PW:         1.10 cm   LV E/e' medial:  9.3 LV IVS:        1.00 cm   LV e' lateral:   11.90 cm/s LVOT diam:     2.00 cm   LV E/e' lateral: 7.6 LV SV:         90 LV SV Index:   41 LVOT Area:     3.14 cm  RIGHT VENTRICLE RV Basal diam:  3.10 cm RV Mid diam:    2.80 cm RV S prime:     11.70 cm/s TAPSE (M-mode): 3.0 cm LEFT ATRIUM             Index         RIGHT ATRIUM           Index LA diam:        4.20 cm 1.89 cm/m   RA Area:     16.30 cm LA Vol (A2C):   72.9 ml 32.79 ml/m  RA Volume:   38.80 ml  17.45 ml/m LA Vol (A4C):   70.8 ml 31.84 ml/m LA Biplane Vol: 72.3 ml 32.52 ml/m  AORTIC VALVE                    PULMONIC VALVE AV Area (Vmax):    2.20 cm     PV Vmax:       0.93 m/s AV Area (Vmean):   2.50 cm     PV Peak grad:  3.5 mmHg AV Area (VTI):     2.39 cm AV Vmax:           170.00 cm/s AV Vmean:          95.400 cm/s AV VTI:            0.378 m AV Peak Grad:      11.6 mmHg AV Mean Grad:      5.0 mmHg LVOT Vmax:         119.00 cm/s LVOT Vmean:  75.800 cm/s LVOT VTI:          0.287 m LVOT/AV VTI ratio: 0.76  AORTA Ao Root diam: 2.80 cm MITRAL VALVE MV Area (PHT): 3.13 cm    SHUNTS MV Area VTI:   2.39 cm    Systemic VTI:  0.29 m MV Peak grad:  5.4 mmHg    Systemic Diam: 2.00 cm MV Mean grad:  2.0 mmHg MV Vmax:       1.16 m/s MV Vmean:      60.5 cm/s MV Decel Time: 242 msec MV E velocity: 90.80 cm/s MV A velocity: 89.50 cm/s MV E/A ratio:  1.01 Nona Dell MD Electronically signed by Nona Dell MD Signature Date/Time: 10/31/2021/2:19:40 PM    Final    MR BRAIN WO CONTRAST  Result Date: 10/31/2021 CLINICAL DATA:  Neuro deficit, acute, stroke suspected EXAM: MRI HEAD WITHOUT CONTRAST MRA HEAD WITHOUT CONTRAST MRA NECK WITHOUT CONTRAST TECHNIQUE: Multiplanar, multiecho pulse sequences of the brain and surrounding structures were obtained without intravenous contrast. Angiographic images of the Circle of Willis were obtained using MRA technique without intravenous contrast. Angiographic images of the neck were obtained using MRA technique without intravenous contrast. Carotid stenosis measurements (when applicable) are obtained utilizing NASCET criteria, using the distal internal carotid diameter as the denominator. COMPARISON:  None Available. FINDINGS: MRI HEAD Brain: There is no acute infarction or intracranial hemorrhage. There is no  intracranial mass, mass effect, or edema. There is no hydrocephalus or extra-axial fluid collection. Vascular: Major vessel flow voids at the skull base are preserved. Skull and upper cervical spine: Normal marrow signal is preserved. Sinuses/Orbits: Paranasal sinuses are aerated. Orbits are unremarkable. Other: Sella is unremarkable.  Mastoid air cells are clear. MRA HEAD Intracranial internal carotid arteries are patent. Middle and anterior cerebral arteries are patent. Anterior communicating artery is present. Intracranial vertebral arteries, basilar artery, posterior cerebral arteries are patent. Left posterior communicating artery is present. There is no significant stenosis or aneurysm. MRA NECK Motion artifact. Common, internal, and external carotid arteries are patent. There is bilateral early bifurcation with partially retropharyngeal course of the internal carotids. Codominant extracranial vertebral arteries are patent. Origins are not well evaluated due to artifact. No hemodynamically significant stenosis. IMPRESSION: No acute infarction, hemorrhage, or mass. No large vessel occlusion or hemodynamically significant stenosis. Electronically Signed   By: Guadlupe Spanish M.D.   On: 10/31/2021 12:39   MR ANGIO HEAD WO CONTRAST  Result Date: 10/31/2021 CLINICAL DATA:  Neuro deficit, acute, stroke suspected EXAM: MRI HEAD WITHOUT CONTRAST MRA HEAD WITHOUT CONTRAST MRA NECK WITHOUT CONTRAST TECHNIQUE: Multiplanar, multiecho pulse sequences of the brain and surrounding structures were obtained without intravenous contrast. Angiographic images of the Circle of Willis were obtained using MRA technique without intravenous contrast. Angiographic images of the neck were obtained using MRA technique without intravenous contrast. Carotid stenosis measurements (when applicable) are obtained utilizing NASCET criteria, using the distal internal carotid diameter as the denominator. COMPARISON:  None Available. FINDINGS:  MRI HEAD Brain: There is no acute infarction or intracranial hemorrhage. There is no intracranial mass, mass effect, or edema. There is no hydrocephalus or extra-axial fluid collection. Vascular: Major vessel flow voids at the skull base are preserved. Skull and upper cervical spine: Normal marrow signal is preserved. Sinuses/Orbits: Paranasal sinuses are aerated. Orbits are unremarkable. Other: Sella is unremarkable.  Mastoid air cells are clear. MRA HEAD Intracranial internal carotid arteries are patent. Middle and anterior cerebral arteries are patent. Anterior communicating artery is present. Intracranial vertebral  arteries, basilar artery, posterior cerebral arteries are patent. Left posterior communicating artery is present. There is no significant stenosis or aneurysm. MRA NECK Motion artifact. Common, internal, and external carotid arteries are patent. There is bilateral early bifurcation with partially retropharyngeal course of the internal carotids. Codominant extracranial vertebral arteries are patent. Origins are not well evaluated due to artifact. No hemodynamically significant stenosis. IMPRESSION: No acute infarction, hemorrhage, or mass. No large vessel occlusion or hemodynamically significant stenosis. Electronically Signed   By: Guadlupe Spanish M.D.   On: 10/31/2021 12:39   MR ANGIO NECK WO CONTRAST  Result Date: 10/31/2021 CLINICAL DATA:  Neuro deficit, acute, stroke suspected EXAM: MRI HEAD WITHOUT CONTRAST MRA HEAD WITHOUT CONTRAST MRA NECK WITHOUT CONTRAST TECHNIQUE: Multiplanar, multiecho pulse sequences of the brain and surrounding structures were obtained without intravenous contrast. Angiographic images of the Circle of Willis were obtained using MRA technique without intravenous contrast. Angiographic images of the neck were obtained using MRA technique without intravenous contrast. Carotid stenosis measurements (when applicable) are obtained utilizing NASCET criteria, using the distal  internal carotid diameter as the denominator. COMPARISON:  None Available. FINDINGS: MRI HEAD Brain: There is no acute infarction or intracranial hemorrhage. There is no intracranial mass, mass effect, or edema. There is no hydrocephalus or extra-axial fluid collection. Vascular: Major vessel flow voids at the skull base are preserved. Skull and upper cervical spine: Normal marrow signal is preserved. Sinuses/Orbits: Paranasal sinuses are aerated. Orbits are unremarkable. Other: Sella is unremarkable.  Mastoid air cells are clear. MRA HEAD Intracranial internal carotid arteries are patent. Middle and anterior cerebral arteries are patent. Anterior communicating artery is present. Intracranial vertebral arteries, basilar artery, posterior cerebral arteries are patent. Left posterior communicating artery is present. There is no significant stenosis or aneurysm. MRA NECK Motion artifact. Common, internal, and external carotid arteries are patent. There is bilateral early bifurcation with partially retropharyngeal course of the internal carotids. Codominant extracranial vertebral arteries are patent. Origins are not well evaluated due to artifact. No hemodynamically significant stenosis. IMPRESSION: No acute infarction, hemorrhage, or mass. No large vessel occlusion or hemodynamically significant stenosis. Electronically Signed   By: Guadlupe Spanish M.D.   On: 10/31/2021 12:39   CT HEAD CODE STROKE WO CONTRAST  Result Date: 10/31/2021 CLINICAL DATA:  Code stroke.  Numbness in left hand EXAM: CT HEAD WITHOUT CONTRAST TECHNIQUE: Contiguous axial images were obtained from the base of the skull through the vertex without intravenous contrast. RADIATION DOSE REDUCTION: This exam was performed according to the departmental dose-optimization program which includes automated exposure control, adjustment of the mA and/or kV according to patient size and/or use of iterative reconstruction technique. COMPARISON:  None  Available. FINDINGS: Brain: There is no acute intracranial hemorrhage, extra-axial fluid collection, or acute infarct. Parenchymal volume is normal. The ventricles are normal in size. Gray-white differentiation is preserved There is no mass lesion.  There is no mass effect or midline shift. Vascular: Choose Skull: Normal. Negative for fracture or focal lesion. Sinuses/Orbits: The paranasal sinuses are clear. The globes and orbits are unremarkable. Other: None. ASPECTS [facility] Stroke Program Early CT Score) - Ganglionic level infarction (caudate, lentiform nuclei, internal capsule, insula, M1-M3 cortex): 7 - Supraganglionic infarction (M4-M6 cortex): 3 Total score (0-10 with 10 being normal): 10 IMPRESSION: 1. No acute intracranial pathology. 2. ASPECTS is 10 Electronically Signed   By: Lesia Hausen M.D.   On: 10/31/2021 10:05    Microbiology: Results for orders placed or performed during the hospital encounter of 10/31/21  Resp Panel by RT-PCR (Flu A&B, Covid) Anterior Nasal Swab     Status: None   Collection Time: 10/31/21 10:49 AM   Specimen: Anterior Nasal Swab  Result Value Ref Range Status   SARS Coronavirus 2 by RT PCR NEGATIVE NEGATIVE Final    Comment: (NOTE) SARS-CoV-2 target nucleic acids are NOT DETECTED.  The SARS-CoV-2 RNA is generally detectable in upper respiratory specimens during the acute phase of infection. The lowest concentration of SARS-CoV-2 viral copies this assay can detect is 138 copies/mL. A negative result does not preclude SARS-Cov-2 infection and should not be used as the sole basis for treatment or other patient management decisions. A negative result may occur with  improper specimen collection/handling, submission of specimen other than nasopharyngeal swab, presence of viral mutation(s) within the areas targeted by this assay, and inadequate number of viral copies(<138 copies/mL). A negative result must be combined with clinical observations, patient history,  and epidemiological information. The expected result is Negative.  Fact Sheet for Patients:  BloggerCourse.com  Fact Sheet for Healthcare Providers:  SeriousBroker.it  This test is no t yet approved or cleared by the [place] FDA and  has been authorized for detection and/or diagnosis of SARS-CoV-2 by FDA under an Emergency Use Authorization (EUA). This EUA will remain  in effect (meaning this test can be used) for the duration of the COVID-19 declaration under Section 564(b)(1) of the Act, 21 U.S.C.section 360bbb-3(b)(1), unless the authorization is terminated  or revoked sooner.       Influenza A by PCR NEGATIVE NEGATIVE Final   Influenza B by PCR NEGATIVE NEGATIVE Final    Comment: (NOTE) The Xpert Xpress SARS-CoV-2/FLU/RSV plus assay is intended as an aid in the diagnosis of influenza from Nasopharyngeal swab specimens and should not be used as a sole basis for treatment. Nasal washings and aspirates are unacceptable for Xpert Xpress SARS-CoV-2/FLU/RSV testing.  Fact Sheet for Patients: BloggerCourse.com  Fact Sheet for Healthcare Providers: SeriousBroker.it  This test is not yet approved or cleared by the [place] FDA and has been authorized for detection and/or diagnosis of SARS-CoV-2 by FDA under an Emergency Use Authorization (EUA). This EUA will remain in effect (meaning this test can be used) for the duration of the COVID-19 declaration under Section 564(b)(1) of the Act, 21 U.S.C. section 360bbb-3(b)(1), unless the authorization is terminated or revoked.  Performed at [facility], [address], [place], [place] [address]     Labs: CBC: Recent Labs  Lab 10/31/21 0953 10/31/21 0958  WBC 8.2  --   NEUTROABS 5.1  --   HGB 13.7 14.3  HCT 40.4 42.0  MCV 90.8  --   PLT 268  --    Basic Metabolic Panel: Recent Labs  Lab 10/31/21 0953  10/31/21 0958  NA 137 139  K 3.6 3.5  CL 104 102  CO2 26  --   GLUCOSE 131* 129*  BUN 13 12  CREATININE 0.85 0.90  CALCIUM 9.1  --    Liver Function Tests: Recent Labs  Lab 10/31/21 0953  AST 21  ALT 21  ALKPHOS 64  BILITOT 0.2*  PROT 7.8  ALBUMIN 4.2   CBG: Recent Labs  Lab 10/31/21 0950  GLUCAP 124*    Discharge time spent: greater than 30 minutes.  Signed: Kendell Bane, MD [facility] 11/01/2021

## 2021-11-01 NOTE — Progress Notes (Signed)
RT asked patient if they wanted to wear CPAP for the night they said yes and explained the style of mask they used at home. RT set patient up and patient had mask on less than 2 seconds and ripped the mask off and stated they wouldn't wear it and she would be fine until she got home. Rt left mask and hose in room and removed machine.

## 2021-11-02 ENCOUNTER — Encounter: Payer: Self-pay | Admitting: "Endocrinology

## 2021-11-02 ENCOUNTER — Ambulatory Visit (INDEPENDENT_AMBULATORY_CARE_PROVIDER_SITE_OTHER): Payer: BC Managed Care – PPO | Admitting: "Endocrinology

## 2021-11-02 VITALS — BP 136/86 | HR 80 | Ht 67.0 in | Wt 264.8 lb

## 2021-11-02 DIAGNOSIS — R7303 Prediabetes: Secondary | ICD-10-CM | POA: Diagnosis not present

## 2021-11-02 DIAGNOSIS — E890 Postprocedural hypothyroidism: Secondary | ICD-10-CM

## 2021-11-02 DIAGNOSIS — E782 Mixed hyperlipidemia: Secondary | ICD-10-CM | POA: Diagnosis not present

## 2021-11-02 LAB — T3, FREE: T3, Free: 1.9 pg/mL — ABNORMAL LOW (ref 2.0–4.4)

## 2021-11-02 MED ORDER — LEVOTHYROXINE SODIUM 200 MCG PO TABS: 200.0000 ug | ORAL_TABLET | Freq: Every day | ORAL | 2 refills | Status: DC

## 2021-11-02 NOTE — Progress Notes (Signed)
11/02/2021    Endocrinology follow-up note   Subjective:    Patient ID: Teresa Vincent, female    DOB: 1980-07-11, PCP [name], MD  Past Medical History:  Diagnosis Date   Abnormal Pap smear of cervix    ANXIETY 08/30/2007   Asthma    Chronic back pain    Claustrophobia    pt requests Versed before surgery.   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     Past Surgical History:  Procedure Laterality Date   ABDOMINAL EXPOSURE N/A 05/29/2017   Procedure: ABDOMINAL EXPOSURE;  Surgeon: Chuck Hint, MD;  Location: [facility];  Service: Vascular;  Laterality: N/A;   ABDOMINAL HYSTERECTOMY     ANTERIOR LUMBAR FUSION N/A 05/29/2017   Procedure: Anterior Lumbar Interbody Fusion  - Lumbar five sacral one;  Surgeon: Tia Alert, MD;  Location: [facility] OR;  Service: Neurosurgery;  Laterality: N/A;   BIOPSY  02/17/2020   Procedure: BIOPSY;  Surgeon: Lanelle Bal, DO;  [facility];  Service: Endoscopy;;  gastric   CARPAL TUNNEL RELEASE Bilateral    CHOLECYSTECTOMY     CHOLECYSTECTOMY N/A 12/29/2016   Procedure: LAPAROSCOPIC CHOLECYSTECTOMY;  Surgeon: [name], MD;  Location: [facility] ORS;  Service: General;  Laterality: N/A;   COLONOSCOPY WITH PROPOFOL N/A 02/17/2020   non-bleeding internal hemorrhoids, otherwise normal.   CYSTOSCOPY/URETEROSCOPY/HOLMIUM LASER/STENT PLACEMENT Right 06/25/2019   Procedure: CYSTOSCOPY WITH RIGHT RETROGRADE PYELOGRAM/RIGHT URETEROSCOPY/HOLMIUM LASER APPLICATION RIGHT URETERAL CALCULUS/RIGHT URETERAL STENT PLACEMENT;  Surgeon: Malen Gauze, MD;   Location: [facility];  Service: Urology;  Laterality: Right;   ESOPHAGOGASTRODUODENOSCOPY (EGD) WITH PROPOFOL N/A 02/17/2020   Procedure: ESOPHAGOGASTRODUODENOSCOPY (EGD) WITH PROPOFOL;  Surgeon: Lanelle Bal, DO;  [facility];  Service: Endoscopy;  Laterality: N/A;   EXTRACORPOREAL SHOCK WAVE LITHOTRIPSY Right 04/10/2019   Procedure: EXTRACORPOREAL SHOCK WAVE LITHOTRIPSY (ESWL);  Surgeon: Noel Christmas, MD;  Location: [facility];  Service: Urology;  Laterality: Right;   LAMINECTOMY WITH POSTERIOR LATERAL ARTHRODESIS LEVEL 1 N/A 02/28/2018   Procedure: Laminectomy and Foraminotomy Lumbar Five-Sacral One, posterolateral fusion and fixation;  Surgeon: Tia Alert, MD;  Location: [facility] OR;  Service: Neurosurgery;  Laterality: N/A;  posterior   LUMBAR EPIDURAL INJECTION     STERIOD INJECTION N/A 01/10/2017   Procedure: MINOR EXPAREL INJECTION;  Surgeon: [name], MD;  Location: [facility];  Service: General;  Laterality: N/A;   TOTAL ABDOMINAL HYSTERECTOMY W/ BILATERAL SALPINGOOPHORECTOMY N/A    TUBAL LIGATION  2003   Social History   Social History   Socioeconomic History   Marital status: Married    Spouse name: Not on file   Number of children: Not on file   Years of education: Not on file   Highest education level: Not on file  Occupational History   Occupation: Doesn't work outside the home  Tobacco Use   Smoking status: Former    Packs/day: 1.00    Years: 26.00    Total pack years: 26.00  Types: Cigarettes    Quit date: 01/07/2021    Years since quitting: 0.8   Smokeless tobacco: Former  Building services engineer Use: Every day   Substances: Nicotine, Flavoring  Substance and Sexual Activity   Alcohol use: Yes    Alcohol/week: 0.0 standard drinks of alcohol    Comment: occasional   Drug use: Not Currently    Types: Marijuana    Comment: occasional   Sexual activity: Not Currently    Birth control/protection: Surgical  Other Topics  Concern   Not on file  Social History Narrative   Not on file   Social Determinants of Health   Financial Resource Strain: Not on file  Food Insecurity: Not on file  Transportation Needs: Not on file  Physical Activity: Not on file  Stress: Not on file  Social Connections: Not on file  Intimate Partner Violence: Not on file     Current Outpatient Medications  Medication Instructions   albuterol (VENTOLIN HFA) 108 (90 Base) MCG/ACT inhaler 1-2 puffs, Inhalation, Every 4 hours PRN   atorvastatin (LIPITOR) 20 MG tablet Take 1 tablet by mouth once daily   Cholecalciferol 50 MCG (2000 UT) CAPS 1 capsule, Oral, Daily   clonazePAM (KLONOPIN) 0.5 MG tablet Take 1/2 tab p.o. qhs for insomnia.   clopidogrel (PLAVIX) 75 mg, Oral, Daily   levothyroxine (SYNTHROID) 200 mcg, Oral, Daily before breakfast   lisinopril (ZESTRIL) 20 mg, Oral, Daily   metoprolol succinate (TOPROL-XL) 50 MG 24 hr tablet TAKE 1 TABLET BY MOUTH ONCE DAILY WITH OR IMMEDIATELY FOLLOWING A MEAL   omeprazole (PRILOSEC) 40 mg, Oral, Daily   ondansetron (ZOFRAN-ODT) 4 MG disintegrating tablet DISSOLVE 1 TABLET IN MOUTH EVERY 8 HOURS AS NEEDED   oxyCODONE-acetaminophen (PERCOCET) 5-325 MG tablet 1 tablet, Oral, 2 times daily PRN   SUMAtriptan (IMITREX) 100 MG tablet 1 tablet at the first sign of migraine. Additional dose in 2 hours if headache persists or recurs.      HPI  Teresa Vincent is a 41 year old female patient with medical history as above.  She is being seen in follow-up for hypothyroidism related to remote past thyroid ablation with I-131 for Graves' disease.   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 She was treated with various doses of levothyroxine over the years currently on levothyroxine over the years.  Over the last several visits, her dose was adjusted to current dose of 200 mcg p.o. daily before breakfast.    She reports  consistency and compliance with medication.  However, recent labs showed high TSH and low free T4 consistent with inadequate replacement.      She has no new complaints today.  She visited ER with dizziness and was diagnosed with CVA for which she was initiated on Plavix.  She presents with significant weight gain.    -She also has hyperlipidemia on atorvastatin , prediabetes with A1c of 5.9 % .  She did not stay on metformin.  Does have significant dyslipidemia, on low-dose statin.    Objective:     HEENT: She has prominent eyes, however no significant lid lag.  Recent Results (from the past 2160 hour(s))  ToxAssure Select,+Antidepr,UR     Status: None   Collection Time: 08/25/21  9:14 AM  Result Value Ref Range   Summary Note     Comment: ==================================================================== ToxAssure Select,+Antidepr,UR ==================================================================== Test  Result       Flag       Units  Drug Present   Oxymorphone                    124                     ng/mg creat   Noroxycodone                   91                      ng/mg creat   Noroxymorphone                 69                      ng/mg creat    Oxymorphone, noroxycodone and noroxymorphone are expected    metabolites of oxycodone. Noroxymorphone is an expected metabolite    of oxymorphone. Sources of oxycodone and/or oxymorphone include    scheduled prescription medications.  ==================================================================== Test                      Result    Flag   Units      Ref Range   Creatinine              148              mg/dL      >=40 ==================================================================== Declared Medications:  Medicat ion list was not provided. ==================================================================== For clinical consultation, please call [phone]. ====================================================================   Urinalysis, Routine w reflex microscopic     Status: Abnormal   Collection Time: 09/07/21  2:53 PM  Result Value Ref Range   Specific Gravity, UA 1.025 1.005 - 1.030   pH, UA 5.5 5.0 - 7.5   Color, UA Yellow Yellow   Appearance Ur Clear Clear   Leukocytes,UA Negative Negative   Protein,UA Negative Negative/Trace   Glucose, UA Negative Negative   Ketones, UA Negative Negative   RBC, UA Trace (A) Negative   Bilirubin, UA Negative Negative   Urobilinogen, Ur 0.2 0.2 - 1.0 mg/dL   Nitrite, UA Negative Negative   Microscopic Examination See below:   Microscopic Examination     Status: Abnormal   Collection Time: 09/07/21  2:53 PM   Urine  Result Value Ref Range   WBC, UA 0-5 0 - 5 /hpf   RBC, Urine 0-2 0 - 2 /hpf   Epithelial Cells (non renal) 0-10 0 - 10 /hpf   Renal Epithel, UA None seen None seen /hpf   Bacteria, UA Few (A) None seen/Few  BLADDER SCAN AMB NON-IMAGING     Status: None   Collection Time: 09/07/21  2:57 PM  Result Value Ref Range   Scan Result 17   TSH     Status: Abnormal   Collection Time: 10/25/21  7:06 AM  Result Value Ref Range   TSH 19.04 (H) mIU/L    Comment:           Reference Range .           > or = 20 Years  0.40-4.50 .                Pregnancy Ranges           First trimester    0.26-2.66  Second trimester   0.55-2.73           Third trimester    0.43-2.91   T4, free     Status: None   Collection Time: 10/25/21  7:06 AM  Result Value Ref Range   Free T4 0.8 0.8 - 1.8 ng/dL  CBG monitoring, ED     Status: Abnormal   Collection Time: 10/31/21  9:50 AM  Result Value Ref Range   Glucose-Capillary 124 (H) 70 - 99 mg/dL    Comment: Glucose reference range applies only to samples taken after fasting for at least 8 hours.  Protime-INR     Status: None   Collection Time: 10/31/21  9:53 AM  Result Value Ref Range   Prothrombin Time 12.7 11.4 - 15.2 seconds    INR 1.0 0.8 - 1.2    Comment: (NOTE) INR goal varies based on device and disease states. Performed at [facility], [address], [place], [place] [address]   APTT     Status: None   Collection Time: 10/31/21  9:53 AM  Result Value Ref Range   aPTT 30 24 - 36 seconds    Comment: Performed at [facility], [address] [place], [place] [address]  CBC     Status: None   Collection Time: 10/31/21  9:53 AM  Result Value Ref Range   WBC 8.2 4.0 - 10.5 K/uL   RBC 4.45 3.87 - 5.11 MIL/uL   Hemoglobin 13.7 12.0 - 15.0 g/dL   HCT 88.4 16.6 - 06.3 %   MCV 90.8 80.0 - 100.0 fL   MCH 30.8 26.0 - 34.0 pg   MCHC 33.9 30.0 - 36.0 g/dL   RDW 01.6 01.0 - 93.2 %   Platelets 268 150 - 400 K/uL   nRBC 0.0 0.0 - 0.2 %    Comment: Performed at [facility], [address] [place], [place] [address]  Differential     Status: None   Collection Time: 10/31/21  9:53 AM  Result Value Ref Range   Neutrophils Relative % 61 %   Neutro Abs 5.1 1.7 - 7.7 K/uL   Lymphocytes Relative 30 %   Lymphs Abs 2.4 0.7 - 4.0 K/uL   Monocytes Relative 6 %   Monocytes Absolute 0.5 0.1 - 1.0 K/uL   Eosinophils Relative 2 %   Eosinophils Absolute 0.2 0.0 - 0.5 K/uL   Basophils Relative 1 %   Basophils Absolute 0.0 0.0 - 0.1 K/uL   Immature Granulocytes 0 %   Abs Immature Granulocytes 0.03 0.00 - 0.07 K/uL    Comment: Performed at [facility], [address] [place], [place] [address]  Comprehensive metabolic panel     Status: Abnormal   Collection Time: 10/31/21  9:53 AM  Result Value Ref Range   Sodium 137 135 - 145 mmol/L   Potassium 3.6 3.5 - 5.1 mmol/L   Chloride 104 98 - 111 mmol/L   CO2 26 22 - 32 mmol/L   Glucose, Bld 131 (H) 70 - 99 mg/dL    Comment: Glucose reference range applies only to samples taken after fasting for at least 8 hours.   BUN 13 6 - 20 mg/dL   Creatinine, Ser 4.27 0.44 - 1.00 mg/dL   Calcium 9.1 8.9 - 06.2 mg/dL   Total Protein 7.8 6.5 - 8.1 g/dL   Albumin 4.2 3.5 - 5.0 g/dL    AST 21 15 - 41 U/L   ALT 21 0 - 44 U/L  Alkaline Phosphatase 64 38 - 126 U/L   Total Bilirubin 0.2 (L) 0.3 - 1.2 mg/dL   GFR, Estimated >40>60 >98>60 mL/min    Comment: (NOTE) Calculated using the CKD-EPI Creatinine Equation (2021)    Anion gap 7 5 - 15    Comment: Performed at [facility], [address] [place], [place] [address]  Ethanol     Status: None   Collection Time: 10/31/21  9:56 AM  Result Value Ref Range   Alcohol, Ethyl (B) <10 <10 mg/dL    Comment: (NOTE) Lowest detectable limit for serum alcohol is 10 mg/dL.  For medical purposes only. Performed at [facility], [address] [place], [place] [address]   Dickie LaI-stat chem 8, ED     Status: Abnormal   Collection Time: 10/31/21  9:58 AM  Result Value Ref Range   Sodium 139 135 - 145 mmol/L   Potassium 3.5 3.5 - 5.1 mmol/L   Chloride 102 98 - 111 mmol/L   BUN 12 6 - 20 mg/dL   Creatinine, Ser 6.210.90 0.44 - 1.00 mg/dL   Glucose, Bld 308129 (H) 70 - 99 mg/dL    Comment: Glucose reference range applies only to samples taken after fasting for at least 8 hours.   Calcium, Ion 1.18 1.15 - 1.40 mmol/L   TCO2 27 22 - 32 mmol/L   Hemoglobin 14.3 12.0 - 15.0 g/dL   HCT 65.742.0 84.636.0 - 96.246.0 %  Resp Panel by RT-PCR (Flu A&B, Covid) Anterior Nasal Swab     Status: None   Collection Time: 10/31/21 10:49 AM   Specimen: Anterior Nasal Swab  Result Value Ref Range   SARS Coronavirus 2 by RT PCR NEGATIVE NEGATIVE    Comment: (NOTE) SARS-CoV-2 target nucleic acids are NOT DETECTED.  The SARS-CoV-2 RNA is generally detectable in upper respiratory specimens during the acute phase of infection. The lowest concentration of SARS-CoV-2 viral copies this assay can detect is 138 copies/mL. A negative result does not preclude SARS-Cov-2 infection and should not be used as the sole basis for treatment or other patient management decisions. A negative result may occur with  improper specimen collection/handling, submission of specimen other than  nasopharyngeal swab, presence of viral mutation(s) within the areas targeted by this assay, and inadequate number of viral copies(<138 copies/mL). A negative result must be combined with clinical observations, patient history, and epidemiological information. The expected result is Negative.  Fact Sheet for Patients:  BloggerCourse.comhttps://www.fda.gov/media/152166/download  Fact Sheet for Healthcare Providers:  SeriousBroker.ithttps://www.fda.gov/media/152162/download  This test is no t yet approved or cleared by the [place] FDA and  has been authorized for detection and/or diagnosis of SARS-CoV-2 by FDA under an Emergency Use Authorization (EUA). This EUA will remain  in effect (meaning this test can be used) for the duration of the COVID-19 declaration under Section 564(b)(1) of the Act, 21 U.S.C.section 360bbb-3(b)(1), unless the authorization is terminated  or revoked sooner.       Influenza A by PCR NEGATIVE NEGATIVE   Influenza B by PCR NEGATIVE NEGATIVE    Comment: (NOTE) The Xpert Xpress SARS-CoV-2/FLU/RSV plus assay is intended as an aid in the diagnosis of influenza from Nasopharyngeal swab specimens and should not be used as a sole basis for treatment. Nasal washings and aspirates are unacceptable for Xpert Xpress SARS-CoV-2/FLU/RSV testing.  Fact Sheet for Patients: BloggerCourse.comhttps://www.fda.gov/media/152166/download  Fact Sheet for Healthcare Providers: SeriousBroker.ithttps://www.fda.gov/media/152162/download  This test is not yet approved or cleared by the [place] FDA and has been authorized for  detection and/or diagnosis of SARS-CoV-2 by FDA under an Emergency Use Authorization (EUA). This EUA will remain in effect (meaning this test can be used) for the duration of the COVID-19 declaration under Section 564(b)(1) of the Act, 21 U.S.C. section 360bbb-3(b)(1), unless the authorization is terminated or revoked.  Performed at [facility], [address] [place], [place] [address]   LDL cholesterol,  direct     Status: Abnormal   Collection Time: 10/31/21 11:01 AM  Result Value Ref Range   Direct LDL 134 (H) 0 - 99 mg/dL    Comment: Performed at [facility], [address], [place], [place] [address]  Hemoglobin A1c     Status: Abnormal   Collection Time: 10/31/21 11:01 AM  Result Value Ref Range   Hgb A1c MFr Bld 5.9 (H) 4.8 - 5.6 %    Comment: (NOTE) Pre diabetes:          5.7%-6.4%  Diabetes:              >6.4%  Glycemic control for   <7.0% adults with diabetes    Mean Plasma Glucose 122.63 mg/dL    Comment: Performed at [facility], [address], [place], [place] [address]  HIV Antibody (routine testing w rflx)     Status: None   Collection Time: 10/31/21 11:13 AM  Result Value Ref Range   HIV Screen 4th Generation wRfx Non Reactive Non Reactive    Comment: Performed at [facility], [address] [place], [place] [address]  Urine rapid drug screen (hosp performed)     Status: None   Collection Time: 10/31/21 11:46 AM  Result Value Ref Range   Opiates NONE DETECTED NONE DETECTED   Cocaine NONE DETECTED NONE DETECTED   Benzodiazepines NONE DETECTED NONE DETECTED   Amphetamines NONE DETECTED NONE DETECTED   Tetrahydrocannabinol NONE DETECTED NONE DETECTED   Barbiturates NONE DETECTED NONE DETECTED    Comment: (NOTE) DRUG SCREEN FOR MEDICAL PURPOSES ONLY.  IF CONFIRMATION IS NEEDED FOR ANY PURPOSE, NOTIFY LAB WITHIN 5 DAYS.  LOWEST DETECTABLE LIMITS FOR URINE DRUG SCREEN Drug Class                     Cutoff (ng/mL) Amphetamine and metabolites    1000 Barbiturate and metabolites    200 Benzodiazepine                 200 Tricyclics and metabolites     300 Opiates and metabolites        300 Cocaine and metabolites        300 THC                            50 Performed at [facility], [address], [place], [place] [address]   Urinalysis, Routine w reflex microscopic     Status: None   Collection Time: 10/31/21 11:46 AM  Result Value  Ref Range   Color, Urine YELLOW YELLOW   APPearance CLEAR CLEAR   Specific Gravity, Urine 1.012 1.005 - 1.030   pH 6.0 5.0 - 8.0   Glucose, UA NEGATIVE NEGATIVE mg/dL   Hgb urine dipstick NEGATIVE NEGATIVE   Bilirubin Urine NEGATIVE NEGATIVE   Ketones, ur NEGATIVE NEGATIVE mg/dL   Protein, ur NEGATIVE NEGATIVE mg/dL   Nitrite NEGATIVE NEGATIVE   Leukocytes,Ua NEGATIVE NEGATIVE    Comment: Performed at [facility], [address]  [place], [place] [address]  Pregnancy, urine     Status: None   Collection Time: 10/31/21 11:46 AM  Result Value Ref Range   Preg Test, Ur NEGATIVE NEGATIVE    Comment:        THE SENSITIVITY OF THIS METHODOLOGY IS >20 mIU/mL. Performed at [facility], [address] [place], [place] [address]   TSH     Status: Abnormal   Collection Time: 10/31/21  1:11 PM  Result Value Ref Range   TSH 40.910 (H) 0.350 - 4.500 uIU/mL    Comment: Performed by a 3rd Generation assay with a functional sensitivity of <=0.01 uIU/mL. Performed at [facility], [address], [place], [place] [address]   ECHOCARDIOGRAM COMPLETE BUBBLE STUDY     Status: None   Collection Time: 10/31/21  2:15 PM  Result Value Ref Range   AR max vel 2.20 cm2   AV Area VTI 2.39 cm2   AV Mean grad 5.0 mmHg   AV Peak grad 11.6 mmHg   Ao pk vel 1.70 m/s   AV Area mean vel 2.50 cm2   MV VTI 2.39 cm2   Area-P 1/2 3.13 cm2   S' Lateral 3.20 cm  T4, free     Status: None   Collection Time: 11/01/21  9:04 AM  Result Value Ref Range   Free T4 0.63 0.61 - 1.12 ng/dL    Comment: (NOTE) Biotin ingestion may interfere with free T4 tests. If the results are inconsistent with the TSH level, previous test results, or the clinical presentation, then consider biotin interference. If needed, order repeat testing after stopping biotin. Performed at [facility], [address], [place], [place] [address]   T3, free     Status: Abnormal   Collection Time: 11/01/21  9:04 AM  Result Value Ref  Range   T3, Free 1.9 (L) 2.0 - 4.4 pg/mL    Comment: (NOTE) Performed At: [facility] [address] 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  There is questionable consistency and compliance in her intake of levothyroxine.  I made a decision to increase her levothyroxine to 200 mcg p.o. daily before breakfast with plan to measure in 3 months.    - We discussed about the correct intake of her thyroid hormone, on empty stomach at fasting, with water, separated by at least 30 minutes from breakfast and other medications,  and separated by more than 4 hours from calcium, iron, multivitamins, acid reflux medications (PPIs). -Patient is made aware of the fact that thyroid hormone replacement is needed for life, dose to be adjusted by periodic monitoring of thyroid function tests.   2. Hyperlipidemia-  -Her recent labs show significantly elevated LDL at 134.  In light of her recent CVA, prediabetes, hyperlipidemia, obesity she is a perfect candidate for lifestyle medicine.   She is accepted the package incidentally. - she acknowledges that there is a room for improvement in her food and drink choices. - Suggestion is made for her to avoid simple carbohydrates  from her diet including Cakes, Sweet Desserts, Ice Cream, Soda (diet and regular), Sweet Tea, Candies, Chips, Cookies, Store Bought Juices, Alcohol , Artificial Sweeteners,  Coffee Creamer, and "Sugar-free" Products, Lemonade. This will help patient to have more stable blood glucose profile and potentially avoid unintended weight gain.  The following Lifestyle Medicine recommendations according  to [employer] of Lifestyle Medicine  [facility]) were discussed and and offered to patient and she  agrees to start the journey:  A. Whole Foods, Plant-Based Nutrition comprising of fruits  and vegetables, plant-based proteins, whole-grain carbohydrates was discussed in detail with the patient.   A list for source of those nutrients were also provided to the patient.  Patient will use only water or unsweetened tea for hydration. B.  The need to stay away from risky substances including alcohol, smoking; obtaining 7 to 9 hours of restorative sleep, at least 150 minutes of moderate intensity exercise weekly, the importance of healthy social connections,  and stress management techniques were discussed. C.  A full color page of  Calorie density of various food groups per pound showing examples of each food groups was provided to the patient.   She is advised to continue atorvastatin 20 mg p.o. daily at bedtime.     3) prediabetes-A1c of 5.9.  She has stopped her metformin.  Lifestyle medicine nutrition discussed above will address prediabetes/diabetes as well.     - I advised patient to maintain close follow up with [name], MD for primary care needs.  -She will return in 3 months with previsit labs.   I spent 35 minutes in the care of the patient today including review of labs from Thyroid Function, CMP, and other relevant labs ; imaging/biopsy records (current and previous including abstractions from other facilities); face-to-face time discussing  her lab results and symptoms, medications doses, her options of short and long term treatment based on the latest standards of care / guidelines;   and documenting the encounter. Risk reduction counseling performed per USPSTF guidelines to reduce obesity and cardiovascular risk factors.   Jonquil Stubbe Saab  participated in the discussions, expressed understanding, and voiced agreement with the above plans.  All questions were answered to her satisfaction. she is encouraged to contact clinic should she have any questions or concerns prior to her return visit.   Marquis Lunch, MD Phone: [phone]  Fax: [phone]  -  This note  was partially dictated with voice recognition software. Similar sounding words can be transcribed inadequately or may not  be corrected upon review.  06/11/2018, 2:32 PM

## 2021-11-02 NOTE — Patient Instructions (Signed)

## 2021-11-04 ENCOUNTER — Telehealth: Payer: Self-pay | Admitting: "Endocrinology

## 2021-11-04 NOTE — Telephone Encounter (Signed)
New message   Patient on plant living diet with no meat.   In the grocery store and seen hamburger plant based meal in the freezer section would that be okay.

## 2021-11-04 NOTE — Telephone Encounter (Signed)
Notified pt that Dr nida was out of the office but to look  at the label on these meals and check ingredients.

## 2021-11-11 ENCOUNTER — Ambulatory Visit (INDEPENDENT_AMBULATORY_CARE_PROVIDER_SITE_OTHER): Payer: BC Managed Care – PPO | Admitting: Family Medicine

## 2021-11-11 DIAGNOSIS — G4733 Obstructive sleep apnea (adult) (pediatric): Secondary | ICD-10-CM

## 2021-11-11 NOTE — Assessment & Plan Note (Signed)
Order for new CPAP supplies has been sent.  Patient will continue on AutoPap settings.  If she continues to have difficulty will arrange for titration sleep study.

## 2021-11-11 NOTE — Progress Notes (Signed)
Subjective:  Patient ID: Teresa Vincent, female    DOB: 21-Oct-1980  Age: 41 y.o. MRN: [id number]  CC: Chief Complaint  Patient presents with   follow up sleep apnea    Was Dx 2019 but didn't start CPAP regularly due to claustrophobia but started using it regularly a few months ago. Patient stated she is having difficulty with machine - it is auto titrate and gets strong before falls a sleep and feels it goes to high and drys out mouth and not comfortable Uses [facility] for CPAP supplies    HPI:  41 year old female presents for follow up regarding OSA.  Patient was diagnosed with sleep apnea via a sleep study in 2019.  This was reviewed today.  She is currently using CPAP on an AutoPap setting.  She states that she has periods of time, particularly early in the morning, where she is experiencing a lot of pressure and the mass does not seem to be sealing appropriately.  Patient is compliant.  Patient therefore needs new supplies to ensure proper seal and fit.  Patient Active Problem List   Diagnosis Date Noted   OSA (obstructive sleep apnea) 11/11/2021   TIA (transient ischemic attack) 11/01/2021   Urinary incontinence 08/29/2021   Chronic pain syndrome 02/28/2021   Nephrolithiasis 04/23/2019   S/P lumbar fusion 02/28/2018   Prediabetes 12/04/2017   Mixed hyperlipidemia 10/11/2016   Morbid obesity (HCC) 06/10/2015   Essential hypertension, benign 12/30/2012   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   Anxiety state 08/30/2007   Migraine headache 08/30/2007   Insomnia 08/30/2007    Social Hx   Social History   Socioeconomic History   Marital status: Married    Spouse name: Not on file   Number of children: Not on file   Years of education: Not on file   Highest education level: Not on file  Occupational History   Occupation: Doesn't work outside the home  Tobacco Use   Smoking status: Former    Packs/day: 1.00    Years: 26.00    Total  pack years: 26.00    Types: Cigarettes    Quit date: 01/07/2021    Years since quitting: 0.8   Smokeless tobacco: Former  Building services engineer Use: Every day   Substances: Nicotine, Flavoring  Substance and Sexual Activity   Alcohol use: Yes    Alcohol/week: 0.0 standard drinks of alcohol    Comment: occasional   Drug use: Not Currently    Types: Marijuana    Comment: occasional   Sexual activity: Not Currently    Birth control/protection: Surgical  Other Topics Concern   Not on file  Social History Narrative   Not on file   Social Determinants of Health   Financial Resource Strain: Not on file  Food Insecurity: Not on file  Transportation Needs: Not on file  Physical Activity: Not on file  Stress: Not on file  Social Connections: Not on file    Review of Systems Per HPI  Objective:  BP 114/69   Pulse 75   Ht 5\' 7"  (1.702 m)   Wt 261 lb 9.6 oz (118.7 kg)   LMP 02/25/2016   BMI 40.97 kg/m      11/11/2021    1:36 PM 11/02/2021    1:50 PM 11/01/2021    6:24 AM  BP/Weight  Systolic BP 114 136 129  Diastolic BP 69 86 76  Wt. (Lbs) 261.6 264.8   BMI 40.97 kg/m2  41.47 kg/m2     Physical Exam Constitutional:      General: She is not in acute distress.    Appearance: Normal appearance. She is obese.  HENT:     Head: Normocephalic and atraumatic.  Pulmonary:     Effort: Pulmonary effort is normal. No respiratory distress.  Neurological:     Mental Status: She is alert.  Psychiatric:        Mood and Affect: Mood normal.        Behavior: Behavior normal.     Lab Results  Component Value Date   WBC 8.2 10/31/2021   HGB 14.3 10/31/2021   HCT 42.0 10/31/2021   PLT 268 10/31/2021   GLUCOSE 129 (H) 10/31/2021   CHOL 167 04/23/2020   TRIG 214 (H) 04/23/2020   HDL 38 (L) 04/23/2020   LDLDIRECT 134 (H) 10/31/2021   LDLCALC 98 04/23/2020   ALT 21 10/31/2021   AST 21 10/31/2021   NA 139 10/31/2021   K 3.5 10/31/2021   CL 102 10/31/2021   CREATININE 0.90  10/31/2021   BUN 12 10/31/2021   CO2 26 10/31/2021   TSH 40.910 (H) 10/31/2021   INR 1.0 10/31/2021   HGBA1C 5.9 (H) 10/31/2021     Assessment & Plan:   Problem List Items Addressed This Visit       Respiratory   OSA (obstructive sleep apnea)    Order for new CPAP supplies has been sent.  Patient will continue on AutoPap settings.  If she continues to have difficulty will arrange for titration sleep study.       Everlene Other DO [facility]

## 2021-11-11 NOTE — Patient Instructions (Signed)
We will fax records to pharmacy.  Message if issues persist.  Take care  Dr. Adriana Simas

## 2021-11-27 ENCOUNTER — Other Ambulatory Visit: Payer: Self-pay | Admitting: Gastroenterology

## 2021-11-27 ENCOUNTER — Other Ambulatory Visit: Payer: Self-pay | Admitting: Family Medicine

## 2021-11-29 ENCOUNTER — Ambulatory Visit (INDEPENDENT_AMBULATORY_CARE_PROVIDER_SITE_OTHER): Payer: BC Managed Care – PPO

## 2021-11-29 ENCOUNTER — Encounter: Payer: Self-pay | Admitting: Orthopedic Surgery

## 2021-11-29 ENCOUNTER — Ambulatory Visit: Payer: BC Managed Care – PPO | Admitting: Orthopedic Surgery

## 2021-11-29 DIAGNOSIS — M25572 Pain in left ankle and joints of left foot: Secondary | ICD-10-CM | POA: Diagnosis not present

## 2021-11-29 DIAGNOSIS — M6701 Short Achilles tendon (acquired), right ankle: Secondary | ICD-10-CM | POA: Diagnosis not present

## 2021-11-29 DIAGNOSIS — S93492S Sprain of other ligament of left ankle, sequela: Secondary | ICD-10-CM

## 2021-11-29 NOTE — Progress Notes (Signed)
Office Visit Note   Patient: Teresa Vincent           Date of Birth: 03/21/1980           MRN: [id number] Visit Date: 11/29/2021              Requested by: Tommie Sams, DO [address] [place],  [place] [address] PCP: Tommie Sams, DO  Chief Complaint  Patient presents with   Left Ankle - Pain      HPI: Patient has been followed for Achilles contracture with presentation of new and more painful symptoms over the anterior talofibular ligament left ankle.  Assessment & Plan: Visit Diagnoses:  1. Pain in left ankle and joints of left foot   2. Achilles tendon contracture, right   3. Sprain of anterior talofibular ligament of left ankle, sequela     Plan: Patient states she has shoewear restrictions at work we will give her an ASO to wear at work and a short fracture boot to wear at home, reevaluate in 4 weeks.  Discussed that we need to see the how the anterior talofibular ligament responds to the immobilization.  Follow-Up Instructions: Return in about 4 weeks (around 12/27/2021).   Ortho Exam  Patient is alert, oriented, no adenopathy, well-dressed, normal affect, normal respiratory effort. Shuvon patient has good pulses.  She has dorsiflexion of the ankle just short of neutral with the knee extended the Achilles tendon is not tender to palpation.  Patient is maximally tender to palpation over the anterior talofibular ligament and stressing of the ligament reproduces her symptoms.  Patient denies any acute recent injuries.  Imaging: XR Ankle Complete Left  Result Date: 11/29/2021 Three-view radiographs of the left ankle shows normal mortise alignment no subcondylar cysts or sclerosis.  No images are attached to the encounter.  Labs: Lab Results  Component Value Date   HGBA1C 5.9 (H) 10/31/2021   HGBA1C 6.0 05/02/2021   HGBA1C 6.0 10/27/2020   REPTSTATUS 01/27/2021 FINAL 01/24/2021   CULT  01/24/2021    NO GROUP A STREP (S.PYOGENES) ISOLATED Performed at  [facility], [address], [place], [place] [address]      Lab Results  Component Value Date   ALBUMIN 4.2 10/31/2021   ALBUMIN 4.5 12/28/2020   ALBUMIN 4.3 06/26/2019    No results found for: "MG" No results found for: "VD25OH"  No results found for: "PREALBUMIN"    Latest Ref Rng & Units 10/31/2021    9:58 AM 10/31/2021    9:53 AM 12/28/2020    9:57 AM  CBC EXTENDED  WBC 4.0 - 10.5 K/uL  8.2  8.7   RBC 3.87 - 5.11 MIL/uL  4.45  4.79   Hemoglobin 12.0 - 15.0 g/dL 20.9  47.0  96.2   HCT 36.0 - 46.0 % 42.0  40.4  43.5   Platelets 150 - 400 K/uL  268  250   NEUT# 1.7 - 7.7 K/uL  5.1    Lymph# 0.7 - 4.0 K/uL  2.4       There is no height or weight on file to calculate BMI.  Orders:  Orders Placed This Encounter  Procedures   XR Ankle Complete Left   No orders of the defined types were placed in this encounter.    Procedures: No procedures performed  Clinical Data: No additional findings.  ROS:  All other systems negative, except as noted in the HPI. Review of Systems  Objective:  Vital Signs: LMP 02/25/2016   Specialty Comments:  No specialty comments available.  PMFS History: Patient Active Problem List   Diagnosis Date Noted   OSA (obstructive sleep apnea) 11/11/2021   TIA (transient ischemic attack) 11/01/2021   Urinary incontinence 08/29/2021   Chronic pain syndrome 02/28/2021   Nephrolithiasis 04/23/2019   S/P lumbar fusion 02/28/2018   Prediabetes 12/04/2017   Mixed hyperlipidemia 10/11/2016   Morbid obesity (HCC) 06/10/2015   Essential hypertension, benign 12/30/2012   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   Anxiety state 08/30/2007   Migraine headache 08/30/2007   Insomnia 08/30/2007   Past Medical History:  Diagnosis Date   Abnormal Pap smear of cervix    ANXIETY 08/30/2007   Asthma    Chronic back pain    Claustrophobia    pt requests Versed before surgery.   Complication of anesthesia    hx of waking  up during anesthesia   GERD (gastroesophageal reflux disease)    History of kidney stones    Hypertension    HYPOTHYROIDISM, POST-RADIATION 02/08/2009   INSOMNIA 08/30/2007   MIGRAINE HEADACHE 08/30/2007   Neck pain    Palpitations    occasionally couple times a week   Sciatica    Sleep apnea    cpap   Stroke (HCC) 2010   states, no deficits and on no meds now.    Family History  Problem Relation Age of Onset   Hypertension Mother    Thyroid disease Neg Hx     Past Surgical History:  Procedure Laterality Date   ABDOMINAL EXPOSURE N/A 05/29/2017   Procedure: ABDOMINAL EXPOSURE;  Surgeon: Chuck Hint, MD;  Location: [facility] OR;  Service: Vascular;  Laterality: N/A;   ABDOMINAL HYSTERECTOMY     ANTERIOR LUMBAR FUSION N/A 05/29/2017   Procedure: Anterior Lumbar Interbody Fusion  - Lumbar five sacral one;  Surgeon: Tia Alert, MD;  Location: [facility] OR;  Service: Neurosurgery;  Laterality: N/A;   BIOPSY  02/17/2020   Procedure: BIOPSY;  Surgeon: Lanelle Bal, DO;  Location: [facility];  Service: Endoscopy;;  gastric   CARPAL TUNNEL RELEASE Bilateral    CHOLECYSTECTOMY     CHOLECYSTECTOMY N/A 12/29/2016   Procedure: LAPAROSCOPIC CHOLECYSTECTOMY;  Surgeon: [name], MD;  Location: [facility] ORS;  Service: General;  Laterality: N/A;   COLONOSCOPY WITH PROPOFOL N/A 02/17/2020   non-bleeding internal hemorrhoids, otherwise normal.   CYSTOSCOPY/URETEROSCOPY/HOLMIUM LASER/STENT PLACEMENT Right 06/25/2019   Procedure: CYSTOSCOPY WITH RIGHT RETROGRADE PYELOGRAM/RIGHT URETEROSCOPY/HOLMIUM LASER APPLICATION RIGHT URETERAL CALCULUS/RIGHT URETERAL STENT PLACEMENT;  Surgeon: Malen Gauze, MD;  Location: [facility];  Service: Urology;  Laterality: Right;   ESOPHAGOGASTRODUODENOSCOPY (EGD) WITH PROPOFOL N/A 02/17/2020   Procedure: ESOPHAGOGASTRODUODENOSCOPY (EGD) WITH PROPOFOL;  Surgeon: Lanelle Bal, DO;  Location: [facility];  Service: Endoscopy;  Laterality: N/A;    EXTRACORPOREAL SHOCK WAVE LITHOTRIPSY Right 04/10/2019   Procedure: EXTRACORPOREAL SHOCK WAVE LITHOTRIPSY (ESWL);  Surgeon: Noel Christmas, MD;  Location: [facility];  Service: Urology;  Laterality: Right;   LAMINECTOMY WITH POSTERIOR LATERAL ARTHRODESIS LEVEL 1 N/A 02/28/2018   Procedure: Laminectomy and Foraminotomy Lumbar Five-Sacral One, posterolateral fusion and fixation;  Surgeon: Tia Alert, MD;  Location: [facility] OR;  Service: Neurosurgery;  Laterality: N/A;  posterior   LUMBAR EPIDURAL INJECTION     STERIOD INJECTION N/A 01/10/2017   Procedure: MINOR EXPAREL INJECTION;  Surgeon: [name], MD;  Location: [facility];  Service: General;  Laterality: N/A;   TOTAL ABDOMINAL  HYSTERECTOMY W/ BILATERAL SALPINGOOPHORECTOMY N/A    TUBAL LIGATION  2003   Social History   Occupational History   Occupation: Doesn't work outside the home  Tobacco Use   Smoking status: Former    Packs/day: 1.00    Years: 26.00    Total pack years: 26.00    Types: Cigarettes    Quit date: 01/07/2021    Years since quitting: 0.8   Smokeless tobacco: Former  Building services engineer Use: Every day   Substances: Nicotine, Flavoring  Substance and Sexual Activity   Alcohol use: Yes    Alcohol/week: 0.0 standard drinks of alcohol    Comment: occasional   Drug use: Not Currently    Types: Marijuana    Comment: occasional   Sexual activity: Not Currently    Birth control/protection: Surgical

## 2021-11-30 DIAGNOSIS — M6701 Short Achilles tendon (acquired), right ankle: Secondary | ICD-10-CM | POA: Diagnosis not present

## 2021-11-30 DIAGNOSIS — S93492S Sprain of other ligament of left ankle, sequela: Secondary | ICD-10-CM | POA: Diagnosis not present

## 2021-11-30 DIAGNOSIS — M25572 Pain in left ankle and joints of left foot: Secondary | ICD-10-CM | POA: Diagnosis not present

## 2021-12-03 DIAGNOSIS — M79605 Pain in left leg: Secondary | ICD-10-CM | POA: Diagnosis not present

## 2021-12-06 ENCOUNTER — Encounter: Payer: Self-pay | Admitting: Physical Medicine and Rehabilitation

## 2021-12-06 MED ORDER — OXYCODONE-ACETAMINOPHEN 5-325 MG PO TABS: 1.0000 | ORAL_TABLET | Freq: Two times a day (BID) | ORAL | 0 refills | Status: DC | PRN

## 2021-12-13 ENCOUNTER — Encounter
Payer: BC Managed Care – PPO | Attending: Physical Medicine and Rehabilitation | Admitting: Physical Medicine and Rehabilitation

## 2021-12-13 ENCOUNTER — Encounter: Payer: Self-pay | Admitting: Physical Medicine and Rehabilitation

## 2021-12-13 VITALS — BP 103/69 | HR 66 | Ht 67.0 in | Wt 244.2 lb

## 2021-12-13 DIAGNOSIS — I10 Essential (primary) hypertension: Secondary | ICD-10-CM | POA: Diagnosis present

## 2021-12-13 DIAGNOSIS — M79672 Pain in left foot: Secondary | ICD-10-CM | POA: Insufficient documentation

## 2021-12-13 DIAGNOSIS — M7661 Achilles tendinitis, right leg: Secondary | ICD-10-CM | POA: Insufficient documentation

## 2021-12-13 DIAGNOSIS — M48062 Spinal stenosis, lumbar region with neurogenic claudication: Secondary | ICD-10-CM | POA: Diagnosis not present

## 2021-12-13 MED ORDER — OXYCODONE-ACETAMINOPHEN 5-325 MG PO TABS: 1.0000 | ORAL_TABLET | Freq: Two times a day (BID) | ORAL | 0 refills | Status: DC | PRN

## 2021-12-13 MED ORDER — LISINOPRIL 10 MG PO TABS: 10.0000 mg | ORAL_TABLET | Freq: Every day | ORAL | 3 refills | Status: DC

## 2021-12-13 NOTE — Patient Instructions (Addendum)
Extracorpoeal shockwave therapy  Weight loss: -Consider Roobois tea daily.  -Discussed the benefits of intermittent fasting. -Discussed foods that can assist in weight loss: 1) leafy greens- high in fiber and nutrients 2) dark chocolate- improves metabolism (if prefer sweetened, best to sweeten with honey instead of sugar).  3) cruciferous vegetables- high in fiber and protein 4) full fat yogurt: high in healthy fat, protein, calcium, and probiotics 5) apples- high in a variety of phytochemicals 6) nuts- high in fiber and protein that increase feelings of fullness 7) grapefruit: rich in nutrients, antioxidants, and fiber (not to be taken with anticoagulation) 8) beans- high in protein and fiber 9) salmon- has high quality protein and healthy fats 10) green tea- rich in polyphenols 11) eggs- rich in choline and vitamin D 12) tuna- high protein, boosts metabolism 13) avocado- decreases visceral abdominal fat 14) chicken (pasture raised): high in protein and iron 15) blueberries- reduce abdominal fat and cholesterol 16) whole grains- decreases calories retained during digestion, speeds metabolism 17) chia seeds- curb appetite 18) chilies- increases fat metabolism  -Discussed supplements that can be used:  1) Metatrim 400mg  BID 30 minutes before breakfast and dinner  2) Sphaeranthus indicus and Garcinia mangostana (combinations of these and #1 can be found in capsicum and zychrome  3) green coffee bean extract 400mg  twice per day or Irvingia (african mango) 150 to 300mg  twice per day.    Foods that can help lower blood pressure and provided with a list: 1) citrus foods- high in vitamins and minerals 2) salmon and other fatty fish - reduces inflammation and oxylipins 3) swiss chard (leafy green)- high level of nitrates 4) pumpkin seeds- one of the best natural sources of magnesium 5) Beans and lentils- high in fiber, magnesium, and potassium 6) Berries- high in flavonoids 7) Amaranth  (whole grain, can be cooked similarly to rice and oats)- high in magnesium and fiber 8) Pistachios- even more effective at reducing BP than other nuts 9) Carrots- high in phenolic compounds that relax blood vessels and reduce inflammation 10) Celery- contain phthalides that relax tissues of arterial walls 11) Tomatoes- can also improve cholesterol and reduce risk of heart disease 12) Broccoli- good source of magnesium, calcium, and potassium 13) Greek yogurt: high in potassium and calcium 14) Herbs and spices: Celery seed, cilantro, saffron, lemongrass, black cumin, ginseng, cinnamon, cardamom, sweet basil, and ginger 15) Chia and flax seeds- also help to lower cholesterol and blood sugar 16) Beets- high levels of nitrates that relax blood vessels  17) spinach and bananas- high in potassium  -Provided lise of supplements that can help with hypertension:  1) magnesium: one high quality brand is Bioptemizers since it contains all 7 types of magnesium, otherwise over the counter magnesium gluconate 400mg  is a good option 2) B vitamins 3) vitamin D 4) potassium 5) CoQ10 6) L-arginine 7) Vitamin C 8) Beetroot -Educated that goal BP is 120/80. -Made goal to incorporate some of the above foods into diet.

## 2021-12-13 NOTE — Progress Notes (Addendum)
Subjective:    Patient ID: Teresa Vincent, female    DOB: [birth year], 41 y.o.   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  -she felt no benefit from going to the chiropractor.  -pain has been severe recently -twice a month she is working 7 days a week and she talked with her boss's boss about slowing down a little and he is seeing if this possible.  -she takes care of the garden center at [employer] -Pain has been present for years -She has had two back surgeries.  -She has had 7 injections but got no relief from these.  -She has gone back to Dr. Ronnald Ramp who did her surgery.  -She was on oxycodone 5mg  that she broke in half to 2.5s which helped. This helped her to get through the day. She has been on it for years. She has been following with Zella Ball.  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  -feels better when laying down, but also painful while sitting -today has been a bad day -she uses her electric blanket and heating pad -she has been working at [employer]- she is the cold at 5am in the morning and this is when the pain is worse.  -the colder it is the worse it is hurting -she got a year membership with chiropractor and has been there for a month. She has not found benefit yet.  2) left knee pain: -has had steroid injection with benefit.  -this has continued to benefit.   3) left foot pain -prescribed meloxicam by Dr. Lacinda Axon -seeing a doctor today and will told whether she needs surgical pain. She wants whatever is necessary to stop the pain -her arch stays swollen -she was supposed to do PT last month but was unable to get in until 6/6  4) Anxiety -she gets anxious about driving and she gets  palpitations -she has had a prior stroke and has felt this anxiety since this time.   5) migraines -has at baseline but last week was very severe, particularly Wednesday -she considered going to the ED on Wednesday due to her pain but pain improved without new intervention by Thursday -she saw her PCP this morning and was recommended to take Imitrex -she does have a history of remote stroke which was thought to be related to her thyroid disease -she has been doing ok now -her thyroid levels had gone way out of control.  -she received a shot in the ED for her pain  -pain has been stable -she was hopeful that the more she lost weight her pain would improve  6) Loss of cervical lordosis: -working with chiropractor on improving this and has already seen improvements -joinged a gym  7) morbid obesity -she is put on a strict diet with her endocrinologist.  -lost 22 lbs in a month. She went from 262 to 244.2 today.  -husband is doing the same diet with her and he lost 34 lbs.  -they do portion control sizes and they eat meat once per week -they went vegan with a lot of things, cream cheese and cheese -they have a cheat day once per week.   8) Left foot pain -uses a boot  to help with the pain -when she is at work and she gets going its ok, but in the beginning she has a lot of pain  9) Achilles tendinosis, right -she is considering surgery  Pain Inventory Average Pain 8 Pain Right Now 5 My pain is sharp, burning, stabbing, and aching  In the last 24 hours, has pain interfered with the following? General activity 8 Relation with others 7 Enjoyment of life 7 What TIME of day is your pain at its worst? morning , daytime, evening and night Sleep (in general) Poor  Pain is worse with: walking, bending, inactivity, standing, and some activites Pain improves with: medication Relief from Meds: 8     Family History  Problem Relation Age of Onset   Hypertension Mother    Thyroid  disease Neg Hx    Social History   Socioeconomic History   Marital status: Married    Spouse name: Not on file   Number of children: Not on file   Years of education: Not on file   Highest education level: Not on file  Occupational History   Occupation: Doesn't work outside the home  Tobacco Use   Smoking status: Former    Packs/day: 1.00    Years: 26.00    Total pack years: 26.00    Types: Cigarettes    Quit date: 01/07/2021    Years since quitting: 0.9   Smokeless tobacco: Former  Scientific laboratory technician Use: Every day   Substances: Nicotine, Flavoring  Substance and Sexual Activity   Alcohol use: Yes    Alcohol/week: 0.0 standard drinks of alcohol    Comment: occasional   Drug use: Not Currently    Types: Marijuana    Comment: occasional   Sexual activity: Not Currently    Birth control/protection: Surgical  Other Topics Concern   Not on file  Social History Narrative   Not on file   Social Determinants of Health   Financial Resource Strain: Not on file  Food Insecurity: Not on file  Transportation Needs: Not on file  Physical Activity: Not on file  Stress: Not on file  Social Connections: Not on file   Past Surgical History:  Procedure Laterality Date   ABDOMINAL EXPOSURE N/A 05/29/2017   Procedure: ABDOMINAL EXPOSURE;  Surgeon: Angelia Mould, MD;  Location: [place];  Service: Vascular;  Laterality: N/A;   ABDOMINAL HYSTERECTOMY     ANTERIOR LUMBAR FUSION N/A 05/29/2017   Procedure: Anterior Lumbar Interbody Fusion  - Lumbar five sacral one;  Surgeon: Eustace Moore, MD;  Location: [place];  Service: Neurosurgery;  Laterality: N/A;   BIOPSY  02/17/2020   Procedure: BIOPSY;  Surgeon: Eloise Harman, DO;  [facility];  Service: Endoscopy;;  gastric   CARPAL TUNNEL RELEASE Bilateral    CHOLECYSTECTOMY     CHOLECYSTECTOMY N/A 12/29/2016   Procedure: LAPAROSCOPIC CHOLECYSTECTOMY;  Surgeon: Virl Cagey, MD;  Location: [facility];  Service:  General;  Laterality: N/A;   COLONOSCOPY WITH PROPOFOL N/A 02/17/2020   non-bleeding internal hemorrhoids, otherwise normal.   CYSTOSCOPY/URETEROSCOPY/HOLMIUM LASER/STENT PLACEMENT Right 06/25/2019   Procedure: CYSTOSCOPY WITH RIGHT RETROGRADE PYELOGRAM/RIGHT URETEROSCOPY/HOLMIUM LASER APPLICATION RIGHT URETERAL CALCULUS/RIGHT URETERAL STENT PLACEMENT;  Surgeon: Cleon Gustin, MD;  Location: [facility];  Service: Urology;  Laterality: Right;   ESOPHAGOGASTRODUODENOSCOPY (EGD) WITH PROPOFOL N/A 02/17/2020   Procedure: ESOPHAGOGASTRODUODENOSCOPY (EGD) WITH PROPOFOL;  Surgeon: Eloise Harman, DO;  [facility];  Service: Endoscopy;  Laterality: N/A;  EXTRACORPOREAL SHOCK WAVE LITHOTRIPSY Right 04/10/2019   Procedure: EXTRACORPOREAL SHOCK WAVE LITHOTRIPSY (ESWL);  Surgeon: Robley Fries, MD;  Location: [facility];  Service: Urology;  Laterality: Right;   LAMINECTOMY WITH POSTERIOR LATERAL ARTHRODESIS LEVEL 1 N/A 02/28/2018   Procedure: Laminectomy and Foraminotomy Lumbar Five-Sacral One, posterolateral fusion and fixation;  Surgeon: Eustace Moore, MD;  Location: [place];  Service: Neurosurgery;  Laterality: N/A;  posterior   LUMBAR EPIDURAL INJECTION     STERIOD INJECTION N/A 01/10/2017   Procedure: MINOR EXPAREL INJECTION;  Surgeon: Virl Cagey, MD;  Location: [facility];  Service: General;  Laterality: N/A;   TOTAL ABDOMINAL HYSTERECTOMY W/ BILATERAL SALPINGOOPHORECTOMY N/A    TUBAL LIGATION  2003   Past Medical History:  Diagnosis Date   Abnormal Pap smear of cervix    ANXIETY 08/30/2007   Asthma    Chronic back pain    Claustrophobia    pt requests Versed before surgery.   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   Ht 5\' 7"  (1.702 m)   LMP 02/25/2016   BMI 40.97 kg/m   Opioid Risk Score:   Fall Risk Score:  `1  Depression screen [facility] 2/9     08/25/2021    9:27 AM 06/06/2021    3:27 PM 03/04/2021    1:01 PM 01/07/2021   12:54 PM 12/20/2020    1:08 PM 10/12/2020    3:05 PM 09/10/2020    1:33 PM  Depression screen PHQ 2/9  Decreased Interest 0 0 0 0 0 0 0  Down, Depressed, Hopeless 0 0 0 0 0 0 0  PHQ - 2 Score 0 0 0 0 0 0 0  Altered sleeping       3  Tired, decreased energy       1  Change in appetite       0  Feeling bad or failure about yourself        0  Trouble concentrating       0  Moving slowly or fidgety/restless       0  Suicidal thoughts       0  PHQ-9 Score       4  Difficult doing work/chores       Not difficult at all    Review of Systems  Constitutional:  Positive for unexpected weight change.  HENT: Negative.    Eyes: Negative.   Respiratory: Negative.    Cardiovascular:  Positive for leg swelling.  Gastrointestinal: Negative.   Endocrine: Negative.   Genitourinary: Negative.   Musculoskeletal:  Positive for arthralgias and back pain.  Skin: Negative.   Allergic/Immunologic: Negative.   Neurological:  Positive for numbness.       Tingling  Hematological: Negative.   Psychiatric/Behavioral:  The patient is nervous/anxious.   All other systems reviewed and are negative.      Objective:   Physical Exam Gen: no distress, normal appearing, weight 244 lbs, BMI 38.25, 103/69 HEENT: oral mucosa pink and moist, NCAT,  exopthalmos Cardio: Reg rate Chest: normal effort, normal rate of breathing Abd: soft, non-distended Ext: no edema Psych: pleasant, normal affect Skin: intact Neuro: Alert and oriented x3.  Musculoskeletal: Normal ambulation    Assessment & Plan:  Mrs. Holzmann is a 41 year old woman who presents to establish care  for lumbar spinal stenosis.  1) Lumbar spinal stenosis with lumbar neurogenic claudication.  -Discussed benefits of exercise in reducing pain. -Refilled percocet -Discussed current symptoms -She is allergic to bupivicaine, she has tried surgery, injections, medication. Oxycodone 5mg  helped. She has never failed a drug screen. She works at [employer]. Increase oxycodone to 5mg  daily.  -Will obtain UDS and pain contract today. If results negative, can prescribe Percocet 5mg -325 monthly PRN.  Lumbar MRI reviewed as follows:  IMPRESSION: 1. No impingement identified in the lumbar spine. 2. 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 3. Degenerative disc disease at T11-12, L2- 3, L3-4, and L5-S1. 4. Cholelithiasis.  -Discussed current symptoms of pain and history of pain.  -Discussed benefits of exercise in reducing pain. -Discussed following foods that may reduce pain: 1) Ginger (especially studied for arthritis)- reduce leukotriene production to decrease inflammation 2) Blueberries- high in phytonutrients that decrease inflammation 3) Salmon- marine omega-3s reduce joint swelling and pain 4) Pumpkin seeds- reduce inflammation 5) dark chocolate- reduces inflammation 6) turmeric- reduces inflammation 7) tart cherries - reduce pain and stiffness 8) extra virgin olive oil - its compound olecanthal helps to block prostaglandins  9) chili peppers- can be eaten or applied topically via capsaicin 10) mint- helpful for headache, muscle aches, joint pain, and itching 11) garlic- reduces inflammation  Link to further information on diet for chronic pain: [email or web address]    2) Obesity: weight 253 lbs, BMI 39.63.  -Educated regarding health benefits of weight loss- for pain, general health, chronic disease prevention, immune health, mental health.  -continue black coffee -Educated regarding health  benefits of weight loss- for pain, general health, chronic disease prevention, immune health, mental health.  -Will monitor weight every visit.  -Consider Roobois tea daily.  -Discussed the benefits of intermittent fasting. -Discussed foods that can assist in weight loss: 1) leafy greens- high in fiber and nutrients 2) dark chocolate- improves metabolism (if prefer sweetened, best to sweeten with honey instead of sugar).  3) cruciferous vegetables- high in fiber and protein 4) full fat yogurt: high in healthy fat, protein, calcium, and probiotics 5) apples- high in a variety of phytochemicals 6) nuts- high in fiber and protein that increase feelings of fullness 7) grapefruit: rich in nutrients, antioxidants, and fiber (not to be taken with anticoagulation) 8) beans- high in protein and fiber 9) salmon- has high quality protein and healthy fats 10) green tea- rich in polyphenols 11) eggs- rich in choline and vitamin D 12) tuna- high protein, boosts metabolism 13) avocado- decreases visceral abdominal fat 14) chicken (pasture raised): high in protein and iron 15) blueberries- reduce abdominal fat and cholesterol 16) whole grains- decreases calories retained during digestion, speeds metabolism 17) chia seeds- curb appetite 18) chilies- increases fat metabolism  -Discussed supplements that can be used:  1) Metatrim 400mg  BID 30 minutes before breakfast and dinner  2) Sphaeranthus indicus and Garcinia mangostana (combinations of these and #1 can be found in capsicum and zychrome  3) green coffee bean extract 400mg  twice per day or Irvingia (african mango) 150 to 300mg  twice per day.   3) migraines -agree with PCP that imitrex is a great option.  Discussed with her that there is increased risk of stroke. She states thyroid function is currently well controlled -discussed any potential triggers- she notes new perfume last week which could have been a trigger -advised to continue to be  attentive for other food or chemical related triggers   4) HTN: -BP is 103/69 today.  -Advised checking BP daily at home and logging results to bring into follow-up appointment with PCP and myself. -Reviewed BP meds today.  -Advised regarding healthy foods that can help lower blood pressure and provided with a list: 1) citrus foods- high in vitamins and minerals 2) salmon and other fatty fish - reduces inflammation and oxylipins 3) swiss chard (leafy green)- high level of nitrates 4) pumpkin seeds- one of the best natural sources of magnesium 5) Beans and lentils- high in fiber, magnesium, and potassium 6) Berries- high in flavonoids 7) Amaranth (whole grain, can be cooked similarly to rice and oats)- high in magnesium and fiber 8) Pistachios- even more effective at reducing BP than other nuts 9) Carrots- high in phenolic compounds that relax blood vessels and reduce inflammation 10) Celery- contain phthalides that relax tissues of arterial walls 11) Tomatoes- can also improve cholesterol and reduce risk of heart disease 12) Broccoli- good source of magnesium, calcium, and potassium 13) Greek yogurt: high in potassium and calcium 14) Herbs and spices: Celery seed, cilantro, saffron, lemongrass, black cumin, ginseng, cinnamon, cardamom, sweet basil, and ginger 15) Chia and flax seeds- also help to lower cholesterol and blood sugar 16) Beets- high levels of nitrates that relax blood vessels  17) spinach and bananas- high in potassium  -Provided lise of supplements that can help with hypertension:  1) magnesium: one high quality brand is Bioptemizers since it contains all 7 types of magnesium, otherwise over the counter magnesium gluconate 400mg  is a good option 2) B vitamins 3) vitamin D 4) potassium 5) CoQ10 6) L-arginine 7) Vitamin C 8) Beetroot -Educated that goal BP is 120/80. -Made goal to incorporate some of the above foods into diet.    5) Left heel plantar  fascitis: -scheduled for extracorporeal shockwave therapy once per week for 3 weeks.   >40 minutes spent in discussion regarding her dietary changes. Discussion of her back pain, feeling the screw when her husband touches her, pain in her back when she moves forward, exracorporeal shockwave therapy, foam rolling, left heel plantar fasciitis, her guilt now when she eats ham, continuing to eat seafood, her dizziness, her low blood pressures, reducing her Lisinopril, trying Atkins chocolate bars

## 2021-12-13 NOTE — Addendum Note (Signed)
Addended by: Izora Ribas on: 12/13/2021 10:31 AM   Modules accepted: Level of Service

## 2021-12-22 ENCOUNTER — Ambulatory Visit: Payer: 59 | Admitting: Physical Medicine and Rehabilitation

## 2021-12-29 ENCOUNTER — Ambulatory Visit: Payer: BC Managed Care – PPO | Admitting: Orthopedic Surgery

## 2022-01-03 ENCOUNTER — Ambulatory Visit (INDEPENDENT_AMBULATORY_CARE_PROVIDER_SITE_OTHER): Payer: BC Managed Care – PPO | Admitting: Family Medicine

## 2022-01-03 ENCOUNTER — Encounter: Payer: Self-pay | Admitting: Family Medicine

## 2022-01-03 VITALS — BP 113/70 | HR 75 | Temp 98.4°F | Wt 240.0 lb

## 2022-01-03 DIAGNOSIS — J029 Acute pharyngitis, unspecified: Secondary | ICD-10-CM

## 2022-01-03 DIAGNOSIS — J069 Acute upper respiratory infection, unspecified: Secondary | ICD-10-CM | POA: Insufficient documentation

## 2022-01-03 LAB — POCT RAPID STREP A (OFFICE): Rapid Strep A Screen: NEGATIVE

## 2022-01-03 MED ORDER — IPRATROPIUM BROMIDE 0.06 % NA SOLN: 2.0000 | Freq: Four times a day (QID) | NASAL | 0 refills | Status: DC | PRN

## 2022-01-03 MED ORDER — CETIRIZINE HCL 10 MG PO TABS: 10.0000 mg | ORAL_TABLET | Freq: Every day | ORAL | 11 refills | Status: DC

## 2022-01-03 NOTE — Patient Instructions (Addendum)
Cut your lisinopril in 1/2 (5 mg daily).  Rest. Fluids.  Medications as prescribed.  Call with concerns.  Take care  Dr. Lacinda Axon

## 2022-01-03 NOTE — Progress Notes (Signed)
Subjective:  Patient ID: Teresa Vincent, female    DOB: 1980/11/25  Age: 41 y.o. MRN: [id number]  CC: Chief Complaint  Patient presents with   Sinusitis    Pt arrives with drainage, stuffy nose, sneezing. Had laryngitis this morning but that has improved.  Lightheaded in the mornings-possibly due to blood pressure medications.    HPI: 41 year old female presents for evaluation of the above.  Symptoms over the past 3 days.  Reports drainage, congestion, sneezing.  Has had laryngitis as well.  Has had recent lightheadedness and blood pressure medication was recently decreased by her pain management physician.  No relieving factors.  No other complaints.  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   Anxiety state 08/30/2007   Migraine headache 08/30/2007   Insomnia 08/30/2007    Social Hx   Social History   Socioeconomic History   Marital status: Married    Spouse name: Not on file   Number of children: Not on file   Years of education: Not on file   Highest education level: Not on file  Occupational History   Occupation: Doesn't work outside the home  Tobacco Use   Smoking status: Former    Packs/day: 1.00    Years: 26.00    Total pack years: 26.00    Types: Cigarettes    Quit date: 01/07/2021    Years since quitting: 0.9   Smokeless tobacco: Former  Scientific laboratory technician Use: Every day   Substances: Nicotine, Flavoring  Substance and Sexual Activity   Alcohol use: Yes    Alcohol/week: 0.0 standard drinks of alcohol     Comment: occasional   Drug use: Not Currently    Types: Marijuana    Comment: occasional   Sexual activity: Not Currently    Birth control/protection: Surgical  Other Topics Concern   Not on file  Social History Narrative   Not on file   Social Determinants of Health   Financial Resource Strain: Not on file  Food Insecurity: Not on file  Transportation Needs: Not on file  Physical Activity: Not on file  Stress: Not on file  Social Connections: Not on file    Review of Systems Per HPI  Objective:  BP 113/70   Pulse 75   Temp 98.4 F (36.9 C)   Wt 240 lb (108.9 kg)   LMP 02/25/2016   SpO2 99%   BMI 37.59 kg/m      01/03/2022    3:31 PM 12/13/2021    9:31 AM 11/11/2021    1:36 PM  BP/Weight  Systolic BP 030 092 330  Diastolic BP 70 69 69  Wt. (Lbs) 240 244.2 261.6  BMI 37.59 kg/m2 38.25 kg/m2 40.97 kg/m2    Physical Exam Vitals and nursing note reviewed.  Constitutional:      General: She is not in acute distress.    Appearance: Normal appearance.  HENT:     Head: Normocephalic and atraumatic.     Right Ear: Tympanic membrane normal.     Left Ear: Tympanic membrane normal.  Mouth/Throat:     Pharynx: Posterior oropharyngeal erythema present.  Eyes:     General:        Right eye: No discharge.        Left eye: No discharge.  Cardiovascular:     Rate and Rhythm: Normal rate and regular rhythm.     Heart sounds: No murmur heard. Pulmonary:     Effort: Pulmonary effort is normal.     Breath sounds: Normal breath sounds. No wheezing, rhonchi or rales.  Neurological:     Mental Status: She is alert.  Psychiatric:        Mood and Affect: Mood normal.        Behavior: Behavior normal.     Lab Results  Component Value Date   WBC 8.2 10/31/2021   HGB 14.3 10/31/2021   HCT 42.0 10/31/2021   PLT 268 10/31/2021   GLUCOSE 129 (H) 10/31/2021   CHOL 167 04/23/2020   TRIG 214 (H) 04/23/2020   HDL 38 (L) 04/23/2020   LDLDIRECT 134 (H) 10/31/2021    LDLCALC 98 04/23/2020   ALT 21 10/31/2021   AST 21 10/31/2021   NA 139 10/31/2021   K 3.5 10/31/2021   CL 102 10/31/2021   CREATININE 0.90 10/31/2021   BUN 12 10/31/2021   CO2 26 10/31/2021   TSH 40.910 (H) 10/31/2021   INR 1.0 10/31/2021   HGBA1C 5.9 (H) 10/31/2021     Assessment & Plan:   Problem List Items Addressed This Visit       Respiratory   Viral URI - Primary    Overall well-appearing.  Rapid strep negative.  Treating with Zyrtec and Atrovent nasal spray.      Other Visit Diagnoses     Sore throat       Relevant Orders   POCT rapid strep A (Completed)       Meds ordered this encounter  Medications   cetirizine (ZYRTEC) 10 MG tablet    Sig: Take 1 tablet (10 mg total) by mouth daily.    Dispense:  30 tablet    Refill:  11   ipratropium (ATROVENT) 0.06 % nasal spray    Sig: Place 2 sprays into both nostrils 4 (four) times daily as needed for rhinitis.    Dispense:  15 mL    Refill:  0    Follow-up:  Return if symptoms worsen or fail to improve.  Everlene Other DO [facility]

## 2022-01-03 NOTE — Assessment & Plan Note (Signed)
Overall well-appearing.  Rapid strep negative.  Treating with Zyrtec and Atrovent nasal spray.

## 2022-01-08 ENCOUNTER — Other Ambulatory Visit: Payer: Self-pay | Admitting: Physical Medicine and Rehabilitation

## 2022-01-09 ENCOUNTER — Telehealth: Payer: Self-pay

## 2022-01-09 MED ORDER — OXYCODONE-ACETAMINOPHEN 5-325 MG PO TABS: 1.0000 | ORAL_TABLET | Freq: Two times a day (BID) | ORAL | 0 refills | Status: DC | PRN

## 2022-01-09 NOTE — Telephone Encounter (Signed)
[facility] sent over a pt request for new Rx to refiil and get authorization for Omeprazole 40 mg cap

## 2022-01-23 ENCOUNTER — Encounter: Payer: BC Managed Care – PPO | Admitting: Physical Medicine and Rehabilitation

## 2022-01-23 ENCOUNTER — Telehealth: Payer: Self-pay

## 2022-01-23 NOTE — Telephone Encounter (Signed)
I phoned and spoke with this pt and she states she has plenty of Omeprazole left but her insurance will not pay [facility] its price therefore a new Rx for Omeprazole has to be sent in to [place]. This will be in the yellow folder on your desk. Please advise

## 2022-01-24 NOTE — Telephone Encounter (Signed)
Paperwork completed and returned in yellow folder.

## 2022-01-25 NOTE — Telephone Encounter (Signed)
Noted scanned to email and faxed to [facility] @1 [phone]

## 2022-01-26 ENCOUNTER — Other Ambulatory Visit: Payer: Self-pay | Admitting: "Endocrinology

## 2022-01-26 DIAGNOSIS — E782 Mixed hyperlipidemia: Secondary | ICD-10-CM | POA: Diagnosis not present

## 2022-01-26 DIAGNOSIS — R7303 Prediabetes: Secondary | ICD-10-CM

## 2022-01-26 DIAGNOSIS — E559 Vitamin D deficiency, unspecified: Secondary | ICD-10-CM | POA: Diagnosis not present

## 2022-01-26 DIAGNOSIS — E890 Postprocedural hypothyroidism: Secondary | ICD-10-CM | POA: Diagnosis not present

## 2022-01-27 ENCOUNTER — Encounter: Payer: Self-pay | Admitting: Physical Medicine and Rehabilitation

## 2022-01-27 LAB — LIPID PANEL
Cholesterol: 100 mg/dL (ref <200)
HDL: 30 mg/dL — ABNORMAL LOW (ref >=50)
LDL Cholesterol (Calc): 51 mg/dL (calc)
Non-HDL Cholesterol (Calc): 70 mg/dL (calc) (ref <130)
Total CHOL/HDL Ratio: 3.3 (calc) (ref <5.0)
Triglycerides: 106 mg/dL (ref <150)

## 2022-01-27 LAB — TSH: TSH: 0.08 mIU/L — ABNORMAL LOW

## 2022-01-27 LAB — T4, FREE: Free T4: 1.6 ng/dL (ref 0.8–1.8)

## 2022-01-27 LAB — HEMOGLOBIN A1C
Hgb A1c MFr Bld: 6.2 % of total Hgb — ABNORMAL HIGH (ref <5.7)
Mean Plasma Glucose: 131 mg/dL
eAG (mmol/L): 7.3 mmol/L

## 2022-01-27 LAB — VITAMIN D 25 HYDROXY (VIT D DEFICIENCY, FRACTURES): Vit D, 25-Hydroxy: 35 ng/mL (ref 30–100)

## 2022-01-30 ENCOUNTER — Encounter: Payer: Self-pay | Admitting: Physical Medicine and Rehabilitation

## 2022-01-30 ENCOUNTER — Encounter
Payer: BC Managed Care – PPO | Attending: Physical Medicine and Rehabilitation | Admitting: Physical Medicine and Rehabilitation

## 2022-01-30 VITALS — BP 117/80 | HR 68 | Ht 67.0 in | Wt 230.8 lb

## 2022-01-30 DIAGNOSIS — E1169 Type 2 diabetes mellitus with other specified complication: Secondary | ICD-10-CM | POA: Diagnosis not present

## 2022-01-30 DIAGNOSIS — M722 Plantar fascial fibromatosis: Secondary | ICD-10-CM | POA: Insufficient documentation

## 2022-01-30 DIAGNOSIS — M7661 Achilles tendinitis, right leg: Secondary | ICD-10-CM | POA: Diagnosis not present

## 2022-01-30 DIAGNOSIS — E039 Hypothyroidism, unspecified: Secondary | ICD-10-CM | POA: Insufficient documentation

## 2022-01-30 DIAGNOSIS — Z79891 Long term (current) use of opiate analgesic: Secondary | ICD-10-CM | POA: Insufficient documentation

## 2022-01-30 DIAGNOSIS — Z5181 Encounter for therapeutic drug level monitoring: Secondary | ICD-10-CM | POA: Insufficient documentation

## 2022-01-30 DIAGNOSIS — G894 Chronic pain syndrome: Secondary | ICD-10-CM | POA: Insufficient documentation

## 2022-01-30 NOTE — Progress Notes (Signed)
Subjective:    Patient ID: Teresa Vincent, female    DOB: [birth year], 41 y.o.   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  -she felt no benefit from going to the chiropractor.  -pain has been severe recently -twice a month she is working 7 days a week and she talked with her boss's boss about slowing down a little and he is seeing if this possible.  -she takes care of the garden center at [employer] -Pain has been present for years -She has had two back surgeries.  -She has had 7 injections but got no relief from these.  -She has gone back to Dr. Yetta Barre who did her surgery.  -She was on oxycodone 5mg  that she broke in half to 2.5s which helped. This helped her to get through the day. She has been on it for years. She has been following with .  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  -feels better when laying down, but also painful while sitting -today has been a bad day -she uses her electric blanket and heating pad -she has been working at [birth year]03-1984- she is the cold at 5am in the morning and this is when the pain is worse.  -the colder it is the worse it is hurting -she got a year membership with chiropractor and has been there for a month. She has not found benefit yet.  2) left knee pain: -has had steroid injection with benefit.  -this has continued to benefit.   3) left foot pain -prescribed meloxicam by Dr. [employer] -seeing a doctor today and will told whether she needs surgical pain. She wants whatever is necessary to stop the pain -her arch stays swollen -she was supposed to do PT last month but was unable to get in until 6/6  4) Anxiety -she gets anxious about driving and she gets palpitations -she has  had a prior stroke and has felt this anxiety since this time.   5) migraines -has at baseline but last week was very severe, particularly Wednesday -she considered going to the ED on Wednesday due to her pain but pain improved without new intervention by Thursday -she saw her PCP this morning and was recommended to take Imitrex -she does have a history of remote stroke which was thought to be related to her thyroid disease -she has been doing ok now -her thyroid levels had gone way out of control.  -she received a shot in the ED for her pain  -pain has been stable -she was hopeful that the more she lost weight her pain would improve  6) Loss of cervical lordosis: -working with chiropractor on improving this and has already seen improvements -joinged a gym  7) morbid obesity -she is put on a strict diet with her endocrinologist.  -lost 22 lbs in a month. She went from 262 to 244.2 today.  -husband is doing the same diet with her and he lost 34 lbs.  -they do portion control sizes and they eat meat once per week -they went vegan with a lot of things, cream cheese and cheese -they have a cheat day once per week.   8) Left foot pain -uses a boot to help with  the pain -when she is at work and she gets going its ok, but in the beginning she has a lot of pain  9) Achilles tendinosis, right -she is considering surgery -she is scheduled with me for ECSWT  10) Type 2 diabetes -every now and then she will have a cheat day -she thinks her endocrinologist will be upset with her that her HgbA1c improves -she has lost 36 lbs -she has stopped soft drinks -she shows be her improvements in her lipid panel -discussed that se loves greens, hummus -her last bite is often 5:30-6;:30, sometimes 7pm if she is hungry.   Pain Inventory Average Pain 9 Pain Right Now 5 My pain is constant, sharp, burning, dull, stabbing, and aching  In the last 24 hours, has pain interfered with the  following? General activity 8 Relation with others 7 Enjoyment of life 7 What TIME of day is your pain at its worst? morning , daytime, evening, and night Sleep (in general) Fair  Pain is worse with: walking, bending, sitting, inactivity, standing, and some activites Pain improves with: medication Relief from Meds: 8     Family History  Problem Relation Age of Onset   Hypertension Mother    Thyroid disease Neg Hx    Social History   Socioeconomic History   Marital status: Married    Spouse name: Not on file   Number of children: Not on file   Years of education: Not on file   Highest education level: Not on file  Occupational History   Occupation: Doesn't work outside the home  Tobacco Use   Smoking status: Former    Packs/day: 1.00    Years: 26.00    Total pack years: 26.00    Types: Cigarettes    Quit date: 01/07/2021    Years since quitting: 1.0   Smokeless tobacco: Former  Building services engineerVaping Use   Vaping Use: Every day   Substances: Nicotine, Flavoring  Substance and Sexual Activity   Alcohol use: Yes    Alcohol/week: 0.0 standard drinks of alcohol    Comment: occasional   Drug use: Not Currently    Types: Marijuana    Comment: occasional   Sexual activity: Not Currently    Birth control/protection: Surgical  Other Topics Concern   Not on file  Social History Narrative   Not on file   Social Determinants of Health   Financial Resource Strain: Not on file  Food Insecurity: Not on file  Transportation Needs: Not on file  Physical Activity: Not on file  Stress: Not on file  Social Connections: Not on file   Past Surgical History:  Procedure Laterality Date   ABDOMINAL EXPOSURE N/A 05/29/2017   Procedure: ABDOMINAL EXPOSURE;  Surgeon: Chuck Hintickson, Christopher S, MD;  [facility];  Service: Vascular;  Laterality: N/A;   ABDOMINAL HYSTERECTOMY     ANTERIOR LUMBAR FUSION N/A 05/29/2017   Procedure: Anterior Lumbar Interbody Fusion  - Lumbar five sacral one;   Surgeon: Tia AlertJones, David S, MD;  [facility];  Service: Neurosurgery;  Laterality: N/A;   BIOPSY  02/17/2020   Procedure: BIOPSY;  Surgeon: Lanelle Balarver, Charles K, DO;  [facility];  Service: Endoscopy;;  gastric   CARPAL TUNNEL RELEASE Bilateral    CHOLECYSTECTOMY     CHOLECYSTECTOMY N/A 12/29/2016   Procedure: LAPAROSCOPIC CHOLECYSTECTOMY;  Surgeon: Lucretia RoersBridges, Lindsay C, MD;  Location: [facility] ORS;  Service: General;  Laterality: N/A;   COLONOSCOPY WITH PROPOFOL N/A 02/17/2020   non-bleeding internal hemorrhoids, otherwise  normal.   CYSTOSCOPY/URETEROSCOPY/HOLMIUM LASER/STENT PLACEMENT Right 06/25/2019   Procedure: CYSTOSCOPY WITH RIGHT RETROGRADE PYELOGRAM/RIGHT URETEROSCOPY/HOLMIUM LASER APPLICATION RIGHT URETERAL CALCULUS/RIGHT URETERAL STENT PLACEMENT;  Surgeon: Malen Gauze, MD;  Location: [facility];  Service: Urology;  Laterality: Right;   ESOPHAGOGASTRODUODENOSCOPY (EGD) WITH PROPOFOL N/A 02/17/2020   Procedure: ESOPHAGOGASTRODUODENOSCOPY (EGD) WITH PROPOFOL;  Surgeon: Lanelle Bal, DO;  Location: [facility];  Service: Endoscopy;  Laterality: N/A;   EXTRACORPOREAL SHOCK WAVE LITHOTRIPSY Right 04/10/2019   Procedure: EXTRACORPOREAL SHOCK WAVE LITHOTRIPSY (ESWL);  Surgeon: Noel Christmas, MD;  [facility];  Service: Urology;  Laterality: Right;   LAMINECTOMY WITH POSTERIOR LATERAL ARTHRODESIS LEVEL 1 N/A 02/28/2018   Procedure: Laminectomy and Foraminotomy Lumbar Five-Sacral One, posterolateral fusion and fixation;  Surgeon: Tia Alert, MD;  Location: [facility] OR;  Service: Neurosurgery;  Laterality: N/A;  posterior   LUMBAR EPIDURAL INJECTION     STERIOD INJECTION N/A 01/10/2017   Procedure: MINOR EXPAREL INJECTION;  Surgeon: [name], MD;  Location: [facility];  Service: General;  Laterality: N/A;   TOTAL ABDOMINAL HYSTERECTOMY W/ BILATERAL SALPINGOOPHORECTOMY N/A    TUBAL LIGATION  2003   Past Medical History:  Diagnosis Date   Abnormal Pap  smear of cervix    ANXIETY 08/30/2007   Asthma    Chronic back pain    Claustrophobia    pt requests Versed before surgery.   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   Ht 5\' 7"  (1.702 m)   Wt 230 lb 12.8 oz (104.7 kg)   LMP 02/25/2016   BMI 36.15 kg/m   Opioid Risk Score:   Fall Risk Score:  `1  Depression screen [facility] 2/9     12/13/2021    9:37 AM 08/25/2021    9:27 AM 06/06/2021    3:27 PM 03/04/2021    1:01 PM 01/07/2021   12:54 PM 12/20/2020    1:08 PM 10/12/2020    3:05 PM  Depression screen PHQ 2/9  Decreased Interest 0 0 0 0 0 0 0  Down, Depressed, Hopeless 0 0 0 0 0 0 0  PHQ - 2 Score 0 0 0 0 0 0 0    Review of Systems  Constitutional:  Positive for unexpected weight change.  HENT: Negative.    Eyes: Negative.   Respiratory: Negative.    Cardiovascular:  Positive for leg swelling.  Gastrointestinal: Negative.   Endocrine: Negative.   Genitourinary: Negative.   Musculoskeletal:  Positive for arthralgias and back pain.  Skin: Negative.   Allergic/Immunologic: Negative.   Neurological:  Positive for numbness.       Tingling  Hematological: Negative.   Psychiatric/Behavioral:  The patient is nervous/anxious.   All other systems reviewed and are negative.      Objective:   Physical Exam Gen: no distress, normal appearing, weight 230 lbs, BMI 36.15, 117/80 HEENT: oral mucosa pink and moist, NCAT, exopthalmos Cardio: Reg rate Chest: normal effort, normal rate of breathing Abd: soft, non-distended Ext: no edema Psych: pleasant,  normal affect Skin: intact Neuro: Alert and oriented x3.   Musculoskeletal: Normal ambulation, right achiles tendinopathy, left heel spur    Assessment & Plan:  Teresa Vincent is a 41 year old woman who presents to establish care for lumbar spinal stenosis.  1) Lumbar spinal stenosis with lumbar neurogenic claudication.  -Discussed benefits of exercise in reducing pain. -Continue percocet -Discussed current symptoms -She is allergic to bupivicaine, she has tried surgery, injections, medication. Oxycodone 5mg  helped. She has never failed a drug screen. She works at . Increase oxycodone to 5mg  daily.  -Will obtain UDS and pain contract today. If results negative, can prescribe Percocet 5mg -325 monthly PRN.  Lumbar MRI reviewed as follows:  IMPRESSION: 1. No impingement identified in the lumbar spine. 2. 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 3. Degenerative disc disease at T11-12, L2- 3, L3-4, and L5-S1. 4. Cholelithiasis.  -Discussed current symptoms of pain and history of pain.  -Discussed benefits of exercise in reducing pain. -Discussed following foods that may reduce pain: 1) Ginger (especially studied for arthritis)- reduce leukotriene production to decrease inflammation 2) Blueberries- high in phytonutrients that decrease inflammation 3) Salmon- marine omega-3s reduce joint swelling and pain 4) Pumpkin seeds- reduce inflammation 5) dark chocolate- reduces inflammation 6) turmeric- reduces inflammation 7) tart cherries - reduce pain and stiffness 8) extra virgin olive oil - its compound olecanthal helps to block prostaglandins  9) chili peppers- can be eaten or applied topically via capsaicin 10) mint- helpful for headache, muscle aches, joint pain, and itching 11) garlic- reduces inflammation  Link to further information on diet for chronic pain: [employer]    2) Obesity: weight 230 lbs, BMI 36.15, commended on 23  lbs weight loss! -Educated regarding health benefits of weight loss- for pain, general health, chronic disease prevention, immune health, mental health.  -continue black coffee -Educated regarding health benefits of weight loss- for pain, general health, chronic disease prevention, immune health, mental health.  -Will monitor weight every visit.  -Consider Roobois tea daily.  -Discussed the benefits of intermittent fasting. -Discussed foods that can assist in weight loss: 1) leafy greens- high in fiber and nutrients 2) dark chocolate- improves metabolism (if prefer sweetened, best to sweeten with honey instead of sugar).  3) cruciferous vegetables- high in fiber and protein 4) full fat yogurt: high in healthy fat, protein, calcium, and probiotics 5) apples- high in a variety of phytochemicals 6) nuts- high in fiber and protein that increase feelings of fullness 7) grapefruit: rich in nutrients, antioxidants, and fiber (not to be taken with anticoagulation) 8) beans- high in protein and fiber 9) salmon- has high quality protein and healthy fats 10) green tea- rich in polyphenols 11) eggs- rich in choline and vitamin D 12) tuna- high protein, boosts metabolism 13) avocado- decreases visceral abdominal fat 14) chicken (pasture raised): high in protein and iron 15) blueberries- reduce abdominal fat and cholesterol 16) whole grains- decreases calories retained during digestion, speeds metabolism 17) chia seeds- curb appetite 18) chilies- increases fat metabolism  -Discussed supplements that can be used:  1) Metatrim 400mg  BID 30 minutes before breakfast and dinner  2) Sphaeranthus indicus and Garcinia mangostana (combinations of these and #1 can be found in capsicum and zychrome  3) green coffee bean extract 400mg  twice per day or Irvingia (african mango) 150 to 300mg  twice per day.   3) migraines -agree with PCP that imitrex is a great option. Discussed with her that there is increased  risk of stroke. She states thyroid function is currently well controlled -discussed any potential triggers- she notes new perfume last week which could have been a trigger -advised to continue to be attentive for other food or chemical related triggers   4) HTN: -BP is 117/80 -Advised checking BP daily at home and logging results to bring into follow-up appointment with PCP and myself. -Reviewed BP meds today.  -Advised regarding healthy foods that can help lower blood pressure and provided with a list: 1) citrus foods- high in vitamins and minerals 2) salmon and other fatty fish - reduces inflammation and oxylipins 3) swiss chard (leafy green)- high level of nitrates 4) pumpkin seeds- one of the best natural sources of magnesium 5) Beans and lentils- high in fiber, magnesium, and potassium 6) Berries- high in flavonoids 7) Amaranth (whole grain, can be cooked similarly to rice and oats)- high in magnesium and fiber 8) Pistachios- even more effective at reducing BP than other nuts 9) Carrots- high in phenolic compounds that relax blood vessels and reduce inflammation 10) Celery- contain phthalides that relax tissues of arterial walls 11) Tomatoes- can also improve cholesterol and reduce risk of heart disease 12) Broccoli- good source of magnesium, calcium, and potassium 13) Greek yogurt: high in potassium and calcium 14) Herbs and spices: Celery seed, cilantro, saffron, lemongrass, black cumin, ginseng, cinnamon, cardamom, sweet basil, and ginger 15) Chia and flax seeds- also help to lower cholesterol and blood sugar 16) Beets- high levels of nitrates that relax blood vessels  17) spinach and bananas- high in potassium  -Provided lise of supplements that can help with hypertension:  1) magnesium: one high quality brand is Bi[employer]em[employer] since it contains all 7 types of magnesium, otherwise over the counter magnesium gluconate  is a good option 2) B vitamins 3) vitamin D 4)  potassium 5) CoQ10 6) L-arginine 7) Vitamin C 8) Beetroot -Educated that goal BP is 120/80. -Made goal to incorporate some of the above foods into diet.    5) Left heel plantar fascitis: -scheduled for extracorporeal shockwave therapy once per week for 3 weeks.   6) Right Achiles tendinopathy: -consider extracorporeal shockwave therapy in the future  7) Type 2 diabetes: -Discussed the benefits of intermittent fasting. Recommended starting with pushing dinner 15 minutes earlier and when this feels easy, continuing to push dinner 15 minutes earlier. Discussed that this can help her body to improve its ability to burn fat rather than glucose, improving insulin sensitivity. Recommended drinking Roobois tea in the evening to help curb appetite and for its numerous health benefits.   -discussed most recent HgbA1c of 6.2. -discussed that she is doing an incredible job with her intermittent fasting -commended eating smaller dinners.  -continue steamed vegetables -continue protein shake -discussed the importance of fiber.   >40 minutes spent in discussion of her hemoglobin A1c, diet education, referral to endocrinology, UDS today, review of her lipid panel

## 2022-01-30 NOTE — Patient Instructions (Signed)
Fast Like A Girl by Clearnce Sorrel

## 2022-02-02 ENCOUNTER — Ambulatory Visit (INDEPENDENT_AMBULATORY_CARE_PROVIDER_SITE_OTHER): Payer: BC Managed Care – PPO | Admitting: "Endocrinology

## 2022-02-02 ENCOUNTER — Encounter: Payer: Self-pay | Admitting: "Endocrinology

## 2022-02-02 ENCOUNTER — Telehealth: Payer: Self-pay | Admitting: *Deleted

## 2022-02-02 VITALS — BP 112/76 | HR 68 | Ht 67.0 in | Wt 235.4 lb

## 2022-02-02 DIAGNOSIS — E890 Postprocedural hypothyroidism: Secondary | ICD-10-CM

## 2022-02-02 DIAGNOSIS — E782 Mixed hyperlipidemia: Secondary | ICD-10-CM

## 2022-02-02 DIAGNOSIS — Z6836 Body mass index (BMI) 36.0-36.9, adult: Secondary | ICD-10-CM

## 2022-02-02 DIAGNOSIS — R7303 Prediabetes: Secondary | ICD-10-CM | POA: Diagnosis not present

## 2022-02-02 LAB — TOXASSURE SELECT,+ANTIDEPR,UR

## 2022-02-02 MED ORDER — LEVOTHYROXINE SODIUM 175 MCG PO TABS: 175.0000 ug | ORAL_TABLET | Freq: Every day | ORAL | 2 refills | Status: DC

## 2022-02-02 NOTE — Telephone Encounter (Signed)
Urine drug screen for this encounter is consistent for prescribed medication 

## 2022-02-02 NOTE — Patient Instructions (Signed)

## 2022-02-02 NOTE — Progress Notes (Signed)
02/02/2022    Endocrinology follow-up note   Subjective:    Patient ID: Teresa Vincent, female    DOB: Dec 07, 1980, PCP [name], MD  Past Medical History:  Diagnosis Date   Abnormal Pap smear of cervix    ANXIETY 08/30/2007   Asthma    Chronic back pain    Claustrophobia    pt requests Versed before surgery.   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     Past Surgical History:  Procedure Laterality Date   ABDOMINAL EXPOSURE N/A 05/29/2017   Procedure: ABDOMINAL EXPOSURE;  Surgeon: Chuck Hint, MD;  Location: [facility] OR;  Service: Vascular;  Laterality: N/A;   ABDOMINAL HYSTERECTOMY     ANTERIOR LUMBAR FUSION N/A 05/29/2017   Procedure: Anterior Lumbar Interbody Fusion  - Lumbar five sacral one;  Surgeon: Tia Alert, MD;  Location: [facility] OR;  Service: Neurosurgery;  Laterality: N/A;   BIOPSY  02/17/2020   Procedure: BIOPSY;  Surgeon: Lanelle Bal, DO;  Location: [facility];  Service: Endoscopy;;  gastric   CARPAL TUNNEL RELEASE Bilateral    CHOLECYSTECTOMY     CHOLECYSTECTOMY N/A 12/29/2016   Procedure: LAPAROSCOPIC CHOLECYSTECTOMY;  Surgeon: [name], MD;  Location: [facility];  Service: General;  Laterality: N/A;   COLONOSCOPY WITH PROPOFOL N/A 02/17/2020   non-bleeding internal hemorrhoids, otherwise normal.   CYSTOSCOPY/URETEROSCOPY/HOLMIUM LASER/STENT PLACEMENT Right 06/25/2019   Procedure: CYSTOSCOPY WITH RIGHT RETROGRADE PYELOGRAM/RIGHT URETEROSCOPY/HOLMIUM LASER APPLICATION RIGHT URETERAL CALCULUS/RIGHT URETERAL STENT PLACEMENT;  Surgeon: Malen Gauze, MD;   Location: [facility];  Service: Urology;  Laterality: Right;   ESOPHAGOGASTRODUODENOSCOPY (EGD) WITH PROPOFOL N/A 02/17/2020   Procedure: ESOPHAGOGASTRODUODENOSCOPY (EGD) WITH PROPOFOL;  Surgeon: Lanelle Bal, DO;  Location: [facility];  Service: Endoscopy;  Laterality: N/A;   EXTRACORPOREAL SHOCK WAVE LITHOTRIPSY Right 04/10/2019   Procedure: EXTRACORPOREAL SHOCK WAVE LITHOTRIPSY (ESWL);  Surgeon: Noel Christmas, MD;  Location[facility];  Service: Urology;  Laterality: Right;   LAMINECTOMY WITH POSTERIOR LATERAL ARTHRODESIS LEVEL 1 N/A 02/28/2018   Procedure: Laminectomy and Foraminotomy Lumbar Five-Sacral One, posterolateral fusion and fixation;  Surgeon: Tia Alert, MD;  Location: [facility] OR;  Service: Neurosurgery;  Laterality: N/A;  posterior   LUMBAR EPIDURAL INJECTION     STERIOD INJECTION N/A 01/10/2017   Procedure: MINOR EXPAREL INJECTION;  Surgeon: [name], MD;  Location: [facility];  Service: General;  Laterality: N/A;   TOTAL ABDOMINAL HYSTERECTOMY W/ BILATERAL SALPINGOOPHORECTOMY N/A    TUBAL LIGATION  2003   Social History   Social History   Socioeconomic History   Marital status: Married    Spouse name: Not on file   Number of children: Not on file   Years of education: Not on file   Highest education level: Not on file  Occupational History   Occupation: Doesn't work outside the home  Tobacco Use   Smoking status: Former    Packs/day: 1.00    Years: 26.00    Total pack years: 26.00  Types: Cigarettes    Quit date: 01/07/2021    Years since quitting: 1.0   Smokeless tobacco: Former  Building services engineerVaping Use   Vaping Use: Every day   Substances: Nicotine, Flavoring  Substance and Sexual Activity   Alcohol use: Yes    Alcohol/week: 0.0 standard drinks of alcohol    Comment: occasional   Drug use: Not Currently    Types: Marijuana    Comment: occasional   Sexual activity: Not Currently    Birth control/protection: Surgical  Other Topics  Concern   Not on file  Social History Narrative   Not on file   Social Determinants of Health   Financial Resource Strain: Not on file  Food Insecurity: Not on file  Transportation Needs: Not on file  Physical Activity: Not on file  Stress: Not on file  Social Connections: Not on file  Intimate Partner Violence: Not on file     Current Outpatient Medications  Medication Instructions   albuterol (VENTOLIN HFA) 108 (90 Base) MCG/ACT inhaler 1-2 puffs, Inhalation, Every 4 hours PRN   cetirizine (ZYRTEC) 10 mg, Oral, Daily   Cholecalciferol 50 MCG (2000 UT) CAPS 1 capsule, Oral, Daily   clonazePAM (KLONOPIN) 0.5 MG tablet Take 1/2 tab p.o. qhs for insomnia.   cyclobenzaprine (FLEXERIL) 10 mg, Oral, 3 times daily   levothyroxine (SYNTHROID) 175 mcg, Oral, Daily before breakfast   metoprolol succinate (TOPROL-XL) 50 MG 24 hr tablet TAKE 1 TABLET BY MOUTH ONCE DAILY WITH OR IMMEDIATELY FOLLOWING A MEAL   omeprazole (PRILOSEC) 40 MG capsule TAKE 1 CAPSULE BY MOUTH TWICE DAILY BEFORE A MEAL   ondansetron (ZOFRAN-ODT) 4 MG disintegrating tablet DISSOLVE 1 TABLET IN MOUTH EVERY 8 HOURS AS NEEDED   oxyCODONE-acetaminophen (PERCOCET) 5-325 MG tablet 1 tablet, Oral, 2 times daily PRN   SUMAtriptan (IMITREX) 100 MG tablet 1 tablet at the first sign of migraine. Additional dose in 2 hours if headache persists or recurs.     HPI  Teresa Vincent is a 41 year old female patient with medical history as above.  She is being seen in follow-up for hypothyroidism related to remote past thyroid ablation with I-131 for Graves' disease.  Hyperlipidemia, prediabetes, hypertension. 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 She was treated with various doses of levothyroxine over the years currently on levothyroxine over the years.  Over the last several visits, her dose was adjusted to current dose of 200 mcg p.o. daily before  breakfast.    She reports consistency and compliance with medication.  Her previsit labs/consistent with slight over-replacement.  This is mainly due to her recent significant weight loss related to lifestyle medicine.   Presents with 36 pounds of weight loss.  She has no new complaints today.  She was  recently  diagnosed with CVA for which she was initiated on Plavix.    -She also has hyperlipidemia , not taking her atorvastatin.  Lifestyle medicine help her lower her LDL to 51.  She has prediabetes with A1c of 6.2%.   She did not stay on metformin.     Objective:     HEENT: She has prominent eyes, however no significant lid lag.  Recent Results (from the past 2160 hour(s))  POCT rapid strep A     Status: None   Collection Time: 01/03/22  4:30 PM  Result Value Ref Range   Rapid Strep A Screen Negative Negative  Lipid panel  Status: Abnormal   Collection Time: 01/26/22  7:03 AM  Result Value Ref Range   Cholesterol 100 <200 mg/dL   HDL 30 (L) > OR = 50 mg/dL   Triglycerides 341 <937 mg/dL   LDL Cholesterol (Calc) 51 mg/dL (calc)    Comment: Reference range: <100 . Desirable range <100 mg/dL for primary prevention;   <70 mg/dL for patients with CHD or diabetic patients  with > or = 2 CHD risk factors. Marland Kitchen LDL-C is now calculated using the Martin-Hopkins  calculation, which is a validated novel method providing  better accuracy than the Friedewald equation in the  estimation of LDL-C.  Horald Pollen et al. Lenox Ahr. 9024;097(35): 2061-2068  (http://education.[facility].com/faq/FAQ164)    Total CHOL/HDL Ratio 3.3 <5.0 (calc)   Non-HDL Cholesterol (Calc) 70 <329 mg/dL (calc)    Comment: For patients with diabetes plus 1 major ASCVD risk  factor, treating to a non-HDL-C goal of <100 mg/dL  (LDL-C of <92 mg/dL) is considered a therapeutic  option.   TSH     Status: Abnormal   Collection Time: 01/26/22  7:03 AM  Result Value Ref Range   TSH 0.08 (L) mIU/L    Comment:            Reference Range .           > or = 20 Years  0.40-4.50 .                Pregnancy Ranges           First trimester    0.26-2.66           Second trimester   0.55-2.73           Third trimester    0.43-2.91   T4, free     Status: None   Collection Time: 01/26/22  7:03 AM  Result Value Ref Range   Free T4 1.6 0.8 - 1.8 ng/dL  VITAMIN D 25 Hydroxy (Vit-D Deficiency, Fractures)     Status: None   Collection Time: 01/26/22  7:03 AM  Result Value Ref Range   Vit D, 25-Hydroxy 35 30 - 100 ng/mL    Comment: Vitamin D Status         25-OH Vitamin D: . Deficiency:                    <20 ng/mL Insufficiency:             20 - 29 ng/mL Optimal:                 > or = 30 ng/mL . For 25-OH Vitamin D testing on patients on  D2-supplementation and patients for whom quantitation  of D2 and D3 fractions is required, the [facility]ureD(TM) 25-OH VIT D, (D2,D3), LC/MS/MS is recommended: order  code 42683 (patients >10yrs). . See Note 1 . Note 1 . For additional information, please refer to  http://education.[facility].com/faq/FAQ199  (This link is being provided for informational/ educational purposes only.)   Hemoglobin A1c     Status: Abnormal   Collection Time: 01/26/22  7:03 AM  Result Value Ref Range   Hgb A1c MFr Bld 6.2 (H) <5.7 % of total Hgb    Comment: For someone without known diabetes, a hemoglobin  A1c value between 5.7% and 6.4% is consistent with prediabetes and should be confirmed with a  follow-up test. . For someone with known diabetes, a value <7% indicates that their diabetes is well controlled. A1c targets  should be individualized based on duration of diabetes, age, comorbid conditions, and other considerations. . This assay result is consistent with an increased risk of diabetes. . Currently, no consensus exists regarding use of hemoglobin A1c for diagnosis of diabetes for children. .    Mean Plasma Glucose 131 mg/dL   eAG (mmol/L) 7.3 mmol/L   ToxAssure Select,+Antidepr,UR     Status: None   Collection Time: 01/30/22  3:17 PM  Result Value Ref Range   Summary Note     Comment: ==================================================================== ToxAssure Select,+Antidepr,UR ==================================================================== Test                             Result       Flag       Units  Drug Present   Oxycodone                      255                     ng/mg creat   Oxymorphone                    284                     ng/mg creat   Noroxycodone                   463                     ng/mg creat   Noroxymorphone                 81                      ng/mg creat    Sources of oxycodone are scheduled prescription medications.    Oxymorphone, noroxycodone, and noroxymorphone are expected    metabolites of oxycodone. Oxymorphone is also available as a    scheduled prescription medication.  ==================================================================== Test                      Result    Flag   Units      Ref Range   Creatinine              94               mg/dL      >=15 ======================================== ============================ Declared Medications:  Medication list was not provided. ==================================================================== For clinical consultation, please call [phone]. 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  I discussed and lowered her levothyroxine to 175 mcg p.o. daily before breakfast.    - We discussed about the correct intake of her thyroid hormone, on empty stomach at fasting, with water, separated by at least 30 minutes from breakfast and other medications,  and separated by more than 4 hours from calcium, iron, multivitamins, acid reflux medications (PPIs). -Patient is  made aware of the fact that thyroid hormone replacement is needed for life, dose to be adjusted by periodic monitoring of thyroid function tests.   2. Hyperlipidemia-  -Her recent labs show significantly low VLDL at 51 from 134.  She did not take her atorvastatin since last visit . This improvement is due to lifestyle medicine helping her  lose 36 pounds of weight.     In light of her recent CVA, prediabetes, hyperlipidemia, obesity she is a good candidate for  lifestyle medicine.    - she acknowledges that there is a room for improvement in her food and drink choices. - Suggestion is made for her to avoid simple carbohydrates  from her diet including Cakes, Sweet Desserts, Ice Cream, Soda (diet and regular), Sweet Tea, Candies, Chips, Cookies, Store Bought Juices, Alcohol , Artificial Sweeteners,  Coffee Creamer, and "Sugar-free" Products, Lemonade. This will help patient to have more stable blood glucose profile and potentially avoid unintended weight gain.  The following Lifestyle Medicine recommendations according to American College of Lifestyle Medicine  [facility]) were discussed and and offered to patient and she  agrees to start the journey:  A. Whole Foods, Plant-Based Nutrition comprising of fruits and vegetables, plant-based proteins, whole-grain carbohydrates was discussed in detail with the patient.   A list for source of those nutrients were also provided to the patient.  Patient will use only water or unsweetened tea for hydration. B.  The need to stay away from risky substances including alcohol, smoking; obtaining 7 to 9 hours of restorative sleep, at least 150 minutes of moderate intensity exercise weekly, the importance of healthy social connections,  and stress management techniques were discussed. C.  A full color page of  Calorie density of various food groups per pound showing examples of each food groups was provided to the patient.    3) prediabetes-A1c of 6.2%.  She has  stopped her metformin.  Lifestyle medicine nutrition discussed above will address prediabetes/diabetes as well.     - I advised patient to maintain close follow up with her PMD for primary care needs.    -She will return in 3 months with previsit labs.   I spent 42 minutes in the care of the patient today including review of labs from Thyroid Function, CMP, and other relevant labs ; imaging/biopsy records (current and previous including abstractions from other facilities); face-to-face time discussing  her lab results and symptoms, medications doses, her options of short and long term treatment based on the latest standards of care / guidelines;   and documenting the encounter. Risk reduction counseling performed per USPSTF guidelines to reduce obesity and cardiovascular risk factors.    Teresa Vincent  participated in the discussions, expressed understanding, and voiced agreement with the above plans.  All questions were answered to her satisfaction. she is encouraged to contact clinic should she have any questions or concerns prior to her return visit.    Marquis Lunch, MD Phone: [phone]  Fax: [phone]  -  This note was partially dictated with voice recognition software. Similar sounding words can be transcribed inadequately or may not  be corrected upon review.  06/11/2018, 2:32 PM

## 2022-02-13 ENCOUNTER — Other Ambulatory Visit: Payer: Self-pay | Admitting: Physical Medicine and Rehabilitation

## 2022-02-13 MED ORDER — OXYCODONE-ACETAMINOPHEN 5-325 MG PO TABS: 1.0000 | ORAL_TABLET | Freq: Two times a day (BID) | ORAL | 0 refills | Status: DC | PRN

## 2022-02-14 ENCOUNTER — Encounter: Payer: Self-pay | Admitting: Physical Medicine and Rehabilitation

## 2022-02-14 ENCOUNTER — Encounter: Payer: BC Managed Care – PPO | Admitting: Physical Medicine and Rehabilitation

## 2022-02-14 VITALS — BP 125/83 | HR 76 | Ht 67.0 in | Wt 238.0 lb

## 2022-02-14 DIAGNOSIS — M722 Plantar fascial fibromatosis: Secondary | ICD-10-CM

## 2022-02-14 NOTE — Progress Notes (Signed)
ECSWT. 1st session for right foot plantar fasciitis  Frequency 5-10 Hz Power: 60-61mJ 2500 shocks

## 2022-02-21 ENCOUNTER — Ambulatory Visit: Payer: BC Managed Care – PPO | Admitting: Physical Medicine and Rehabilitation

## 2022-02-24 ENCOUNTER — Encounter: Payer: Self-pay | Admitting: Physical Medicine and Rehabilitation

## 2022-02-24 ENCOUNTER — Encounter
Payer: BC Managed Care – PPO | Attending: Physical Medicine and Rehabilitation | Admitting: Physical Medicine and Rehabilitation

## 2022-02-24 VITALS — BP 138/85 | HR 65 | Ht 67.0 in | Wt 233.0 lb

## 2022-02-24 DIAGNOSIS — M722 Plantar fascial fibromatosis: Secondary | ICD-10-CM | POA: Insufficient documentation

## 2022-02-24 NOTE — Patient Instructions (Signed)
Nutritional Weight and Wellness Dishing Up Nutrition

## 2022-02-27 NOTE — Progress Notes (Signed)
ECSWT. 2nd session for right foot plantar fasciitis  Frequency 5-10 Hz Power: 60-75mJ 2500 shocks

## 2022-02-28 ENCOUNTER — Encounter: Payer: Self-pay | Admitting: Physical Medicine and Rehabilitation

## 2022-02-28 VITALS — BP 132/88 | HR 74 | Temp 98.9°F | Ht 67.0 in | Wt 237.0 lb

## 2022-02-28 DIAGNOSIS — M722 Plantar fascial fibromatosis: Secondary | ICD-10-CM

## 2022-02-28 NOTE — Progress Notes (Signed)
ECSWT. 3rd session for right foot plantar fasciitis  Frequency 5-10 Hz Power: 60-74mJ 2500 shocks

## 2022-03-02 ENCOUNTER — Encounter: Payer: Self-pay | Admitting: Physical Medicine and Rehabilitation

## 2022-03-06 ENCOUNTER — Emergency Department (HOSPITAL_COMMUNITY): Payer: BC Managed Care – PPO

## 2022-03-06 ENCOUNTER — Encounter (HOSPITAL_COMMUNITY): Payer: Self-pay

## 2022-03-06 ENCOUNTER — Other Ambulatory Visit: Payer: Self-pay

## 2022-03-06 ENCOUNTER — Emergency Department (HOSPITAL_COMMUNITY)
Admission: EM | Admit: 2022-03-06 | Discharge: 2022-03-06 | Disposition: A | Discharge status: Home / Self Care | Payer: BC Managed Care – PPO | Attending: Emergency Medicine | Admitting: Emergency Medicine

## 2022-03-06 DIAGNOSIS — Z7951 Long term (current) use of inhaled steroids: Secondary | ICD-10-CM | POA: Diagnosis not present

## 2022-03-06 DIAGNOSIS — Z87891 Personal history of nicotine dependence: Secondary | ICD-10-CM | POA: Insufficient documentation

## 2022-03-06 DIAGNOSIS — I10 Essential (primary) hypertension: Secondary | ICD-10-CM | POA: Diagnosis not present

## 2022-03-06 DIAGNOSIS — R002 Palpitations: Secondary | ICD-10-CM | POA: Diagnosis not present

## 2022-03-06 DIAGNOSIS — E039 Hypothyroidism, unspecified: Secondary | ICD-10-CM | POA: Diagnosis not present

## 2022-03-06 DIAGNOSIS — Z79899 Other long term (current) drug therapy: Secondary | ICD-10-CM | POA: Insufficient documentation

## 2022-03-06 DIAGNOSIS — J45909 Unspecified asthma, uncomplicated: Secondary | ICD-10-CM | POA: Insufficient documentation

## 2022-03-06 DIAGNOSIS — Z20822 Contact with and (suspected) exposure to covid-19: Secondary | ICD-10-CM | POA: Insufficient documentation

## 2022-03-06 LAB — TROPONIN I (HIGH SENSITIVITY): Troponin I (High Sensitivity): <2 ng/L (ref <18)

## 2022-03-06 LAB — BASIC METABOLIC PANEL
Anion gap: 7 (ref 5–15)
BUN: 11 mg/dL (ref 6–20)
CO2: 25 mmol/L (ref 22–32)
Calcium: 9.2 mg/dL (ref 8.9–10.3)
Chloride: 105 mmol/L (ref 98–111)
Creatinine, Ser: 0.84 mg/dL (ref 0.44–1.00)
GFR, Estimated: >60 mL/min (ref >60)
Glucose, Bld: 123 mg/dL — ABNORMAL HIGH (ref 70–99)
Potassium: 4.1 mmol/L (ref 3.5–5.1)
Sodium: 137 mmol/L (ref 135–145)

## 2022-03-06 LAB — CBC WITH DIFFERENTIAL/PLATELET
Abs Immature Granulocytes: 0.05 10*3/uL (ref 0.00–0.07)
Basophils Absolute: 0 10*3/uL (ref 0.0–0.1)
Basophils Relative: 0 %
Eosinophils Absolute: 0.1 10*3/uL (ref 0.0–0.5)
Eosinophils Relative: 1 %
HCT: 39.9 % (ref 36.0–46.0)
Hemoglobin: 13.1 g/dL (ref 12.0–15.0)
Immature Granulocytes: 1 %
Lymphocytes Relative: 27 %
Lymphs Abs: 2.5 10*3/uL (ref 0.7–4.0)
MCH: 29.4 pg (ref 26.0–34.0)
MCHC: 32.8 g/dL (ref 30.0–36.0)
MCV: 89.5 fL (ref 80.0–100.0)
Monocytes Absolute: 0.6 10*3/uL (ref 0.1–1.0)
Monocytes Relative: 6 %
Neutro Abs: 6 10*3/uL (ref 1.7–7.7)
Neutrophils Relative %: 65 %
Platelets: 257 10*3/uL (ref 150–400)
RBC: 4.46 MIL/uL (ref 3.87–5.11)
RDW: 13 % (ref 11.5–15.5)
WBC: 9.2 10*3/uL (ref 4.0–10.5)
nRBC: 0 % (ref 0.0–0.2)

## 2022-03-06 LAB — RESP PANEL BY RT-PCR (RSV, FLU A&B, COVID)  RVPGX2
Influenza A by PCR: NEGATIVE
Influenza B by PCR: NEGATIVE
Resp Syncytial Virus by PCR: NEGATIVE
SARS Coronavirus 2 by RT PCR: NEGATIVE

## 2022-03-06 LAB — TSH: TSH: 4.615 u[IU]/mL — ABNORMAL HIGH (ref 0.350–4.500)

## 2022-03-06 LAB — T4, FREE: Free T4: 0.8 ng/dL (ref 0.61–1.12)

## 2022-03-06 NOTE — Discharge Instructions (Addendum)
It was a pleasure caring for you today in the emergency department.  Please return to the emergency department for any worsening or worrisome symptoms.  Please follow up with your cardiologist  Please follow up on mychart in regards to your thyroid test results from today  Please take home medications as prescribed

## 2022-03-06 NOTE — ED Provider Notes (Addendum)
[facility] Provider Note   CSN: [id number] Arrival date & time: 03/06/22  1047     History  Chief Complaint  Patient presents with   Palpitations    Teresa Vincent is a 41 y.o. female.  Patient as above with significant medical history as below, including anxiety, asthma, htn, hypothyroid, insomnia, palpitations who presents to the ED with complaint of palpitations She is on beta block and lisinopril  Bp has been running high last week so she restarted her prev d/c'd lisnopril This morning having worse palpitations that her typical, intermittent, a/w light headed sensation, resolve spontaneously Taking metoprolol as prescribed Sees cardiology in [place] O/w compliant with home medications, no recent diet changes, no recent stimulant use other than typical caffeine intake No chest pain or dib, no n/v, no weakness, no falls, no numbness/tingling     Past Medical History:  Diagnosis Date   Abnormal Pap smear of cervix    ANXIETY 08/30/2007   Asthma    Chronic back pain    Claustrophobia    pt requests Versed before surgery.   Complication of anesthesia    hx of waking up during anesthesia   GERD (gastroesophageal reflux disease)    History of kidney stones    Hypertension    HYPOTHYROIDISM, POST-RADIATION 02/08/2009   INSOMNIA 08/30/2007   MIGRAINE HEADACHE 08/30/2007   Neck pain    Palpitations    occasionally couple times a week   Sciatica    Sleep apnea    cpap   Stroke ([place]) 2010   states, no deficits and on no meds now.    Past Surgical History:  Procedure Laterality Date   ABDOMINAL EXPOSURE N/A 05/29/2017   Procedure: ABDOMINAL EXPOSURE;  Surgeon: Angelia Mould, MD;  Location: [facility] OR;  Service: Vascular;  Laterality: N/A;   ABDOMINAL HYSTERECTOMY     ANTERIOR LUMBAR FUSION N/A 05/29/2017   Procedure: Anterior Lumbar Interbody Fusion  - Lumbar five sacral one;  Surgeon: Eustace Moore, MD;  Location: [facility];  Service:  Neurosurgery;  Laterality: N/A;   BIOPSY  02/17/2020   Procedure: BIOPSY;  Surgeon: Eloise Harman, DO;  [facility];  Service: Endoscopy;;  gastric   CARPAL TUNNEL RELEASE Bilateral    CHOLECYSTECTOMY     CHOLECYSTECTOMY N/A 12/29/2016   Procedure: LAPAROSCOPIC CHOLECYSTECTOMY;  Surgeon: Virl Cagey, MD;  Location: [facility];  Service: General;  Laterality: N/A;   COLONOSCOPY WITH PROPOFOL N/A 02/17/2020   non-bleeding internal hemorrhoids, otherwise normal.   CYSTOSCOPY/URETEROSCOPY/HOLMIUM LASER/STENT PLACEMENT Right 06/25/2019   Procedure: CYSTOSCOPY WITH RIGHT RETROGRADE PYELOGRAM/RIGHT URETEROSCOPY/HOLMIUM LASER APPLICATION RIGHT URETERAL CALCULUS/RIGHT URETERAL STENT PLACEMENT;  Surgeon: Cleon Gustin, MD;  Location: [facility];  Service: Urology;  Laterality: Right;   ESOPHAGOGASTRODUODENOSCOPY (EGD) WITH PROPOFOL N/A 02/17/2020   Procedure: ESOPHAGOGASTRODUODENOSCOPY (EGD) WITH PROPOFOL;  Surgeon: Eloise Harman, DO;  [facility];  Service: Endoscopy;  Laterality: N/A;   EXTRACORPOREAL SHOCK WAVE LITHOTRIPSY Right 04/10/2019   Procedure: EXTRACORPOREAL SHOCK WAVE LITHOTRIPSY (ESWL);  Surgeon: Robley Fries, MD;  Location: [facility];  Service: Urology;  Laterality: Right;   LAMINECTOMY WITH POSTERIOR LATERAL ARTHRODESIS LEVEL 1 N/A 02/28/2018   Procedure: Laminectomy and Foraminotomy Lumbar Five-Sacral One, posterolateral fusion and fixation;  Surgeon: Eustace Moore, MD;  Location: [facility];  Service: Neurosurgery;  Laterality: N/A;  posterior   LUMBAR EPIDURAL INJECTION     STERIOD INJECTION N/A 01/10/2017   Procedure: MINOR EXPAREL  INJECTION;  Surgeon: Virl Cagey, MD;  Location: [facility];  Service: General;  Laterality: N/A;   TOTAL ABDOMINAL HYSTERECTOMY W/ BILATERAL SALPINGOOPHORECTOMY N/A    TUBAL LIGATION  2003     The history is provided by the patient. No language interpreter was used.  Palpitations Associated  symptoms: no chest pain, no cough, no nausea, no shortness of breath and no vomiting        Home Medications Prior to Admission medications   Medication Sig Start Date End Date Taking? Authorizing Provider  albuterol (VENTOLIN HFA) 108 (90 Base) MCG/ACT inhaler Inhale 1-2 puffs into the lungs every 4 (four) hours as needed for wheezing or shortness of breath. 08/29/21   Coral Spikes, DO  cetirizine (ZYRTEC) 10 MG tablet Take 1 tablet (10 mg total) by mouth daily. 01/03/22   Coral Spikes, DO  Cholecalciferol 50 MCG (2000 UT) CAPS Take 1 capsule by mouth daily.    [provider]  clonazePAM (KLONOPIN) 0.5 MG tablet Take 1/2 tab p.o. qhs for insomnia. Patient taking differently: Take 0.5 mg by mouth at bedtime. 08/29/21   Coral Spikes, DO  cyclobenzaprine (FLEXERIL) 10 MG tablet Take 10 mg by mouth 3 (three) times daily. 11/27/21   [provider]  levothyroxine (SYNTHROID) 175 MCG tablet Take 1 tablet (175 mcg total) by mouth daily before breakfast. 02/02/22   Nida, Marella Chimes, MD  metoprolol succinate (TOPROL-XL) 50 MG 24 hr tablet TAKE 1 TABLET BY MOUTH ONCE DAILY WITH OR IMMEDIATELY FOLLOWING A MEAL 11/28/21   Cook, Aredale G, DO  omeprazole (PRILOSEC) 40 MG capsule TAKE 1 CAPSULE BY MOUTH TWICE DAILY BEFORE A MEAL 12/07/21   Annitta Needs, NP  ondansetron (ZOFRAN-ODT) 4 MG disintegrating tablet DISSOLVE 1 TABLET IN MOUTH EVERY 8 HOURS AS NEEDED Patient taking differently: Take 4 mg by mouth every 8 (eight) hours as needed for nausea or vomiting. 05/19/21   Coral Spikes, DO  oxyCODONE-acetaminophen (PERCOCET) 5-325 MG tablet Take 1 tablet by mouth 2 (two) times daily as needed for moderate pain. 02/13/22 02/13/23  Raulkar, Clide Deutscher, MD  SUMAtriptan (IMITREX) 100 MG tablet 1 tablet at the first sign of migraine. Additional dose in 2 hours if headache persists or recurs. 02/28/21   Coral Spikes, DO      Allergies    Bupivacaine, Contrast media [iodinated contrast media],  Indomethacin, Adhesive [tape], and Celexa [citalopram hydrobromide]    Review of Systems   Review of Systems  Constitutional:  Negative for chills and fever.  HENT:  Negative for facial swelling and trouble swallowing.   Eyes:  Negative for photophobia and visual disturbance.  Respiratory:  Negative for cough and shortness of breath.   Cardiovascular:  Positive for palpitations. Negative for chest pain.  Gastrointestinal:  Negative for abdominal pain, nausea and vomiting.  Endocrine: Negative for polydipsia and polyuria.  Genitourinary:  Negative for difficulty urinating and hematuria.  Musculoskeletal:  Negative for gait problem and joint swelling.  Skin:  Negative for pallor and rash.  Neurological:  Positive for light-headedness. Negative for syncope and headaches.  Psychiatric/Behavioral:  Negative for agitation and confusion.     Physical Exam Updated Vital Signs BP 122/78   Pulse 63   Temp 98.8 F (37.1 C) (Oral)   Resp 14   Ht 5\' 7"  (1.702 m)   Wt 108 kg   LMP 02/25/2016   SpO2 98%   BMI 37.29 kg/m  Physical Exam Vitals and nursing note reviewed.  Constitutional:      General: She is not in acute distress.    Appearance: Normal appearance. She is obese. She is not ill-appearing.  HENT:     Head: Normocephalic and atraumatic.     Right Ear: External ear normal.     Left Ear: External ear normal.     Nose: Nose normal.     Mouth/Throat:     Mouth: Mucous membranes are moist.  Eyes:     General: No scleral icterus.       Right eye: No discharge.        Left eye: No discharge.     Extraocular Movements: Extraocular movements intact.     Pupils: Pupils are equal, round, and reactive to light.  Cardiovascular:     Rate and Rhythm: Normal rate and regular rhythm.     Pulses: Normal pulses.     Heart sounds: Normal heart sounds. No murmur heard.    No S3 or S4 sounds.  Pulmonary:     Effort: Pulmonary effort is normal. No tachypnea, accessory muscle usage or  respiratory distress.     Breath sounds: Normal breath sounds.  Abdominal:     General: Abdomen is flat.     Palpations: Abdomen is soft.     Tenderness: There is no abdominal tenderness. There is no guarding or rebound.  Musculoskeletal:        General: Normal range of motion.     Cervical back: Normal range of motion. No rigidity.     Right lower leg: No edema.     Left lower leg: No edema.  Skin:    General: Skin is warm and dry.     Capillary Refill: Capillary refill takes less than 2 seconds.  Neurological:     General: No focal deficit present.     Mental Status: She is alert and oriented to person, place, and time.     GCS: GCS eye subscore is 4. GCS verbal subscore is 5. GCS motor subscore is 6.  Psychiatric:        Mood and Affect: Mood normal.        Behavior: Behavior normal.     ED Results / Procedures / Treatments   Labs (all labs ordered are listed, but only abnormal results are displayed) Labs Reviewed  BASIC METABOLIC PANEL - Abnormal; Notable for the following components:      Result Value   Glucose, Bld 123 (*)    All other components within normal limits  TSH - Abnormal; Notable for the following components:   TSH 4.615 (*)    All other components within normal limits  RESP PANEL BY RT-PCR (RSV, FLU A&B, COVID)  [id number]  CBC WITH DIFFERENTIAL/PLATELET  T4, FREE  I-STAT BETA HCG BLOOD, ED ([facility], [facility], [facility] ONLY)  TROPONIN I (HIGH SENSITIVITY)  TROPONIN I (HIGH SENSITIVITY)    EKG EKG Interpretation  Date/Time:  Monday March 06 2022 10:55:59 EST Ventricular Rate:  73 PR Interval:  164 QRS Duration: 82 QT Interval:  408 QTC Calculation: 449 R Axis:   38 Text Interpretation: Normal sinus rhythm Normal ECG No previous ECGs available twi noted on prior Confirmed by Wynona Dove ([id number]) on 03/06/2022 11:39:26 AM  Radiology DG Chest Port 1 View  Result Date: 03/06/2022 CLINICAL DATA:  Palpitations. EXAM: PORTABLE CHEST 1 VIEW COMPARISON:  06/10/2018  FINDINGS: The lungs are clear without focal pneumonia, edema, pneumothorax or pleural effusion. Left base opacity at the cardiac apex is compatible with  fat pad as confirmed on CT study 06/04/2019. The cardiopericardial silhouette is within normal limits for size. The visualized bony structures of the thorax are unremarkable. Telemetry leads overlie the chest. IMPRESSION: No active disease. Electronically Signed   By: Kennith Center M.D.   On: 03/06/2022 11:38    Procedures Procedures    Medications Ordered in ED Medications - No data to display  ED Course/ Medical Decision Making/ A&P Clinical Course as of 03/06/22 1254  Mon Mar 06, 2022  1241 Currently asymptomatic. Pt reports she has to leave; does not want to stay for any more cardiac monitoring or workup. She will follow up with her cardiologist but unsure if their name so ambu referral to CVD in [place] was placed. Trop x1 neg, no chest pain. EKG WNL [SG]  1245 Sodium: 137 [CR]    Clinical Course User Index [CR] Peter Garter, PA [SG] Sloan Leiter, DO                           Medical Decision Making Amount and/or Complexity of Data Reviewed Labs: ordered. Radiology: ordered.   This patient presents to the ED with chief complaint(s) of palpitations/light headed with pertinent past medical history of as above which further complicates the presenting complaint. The complaint involves an extensive differential diagnosis and also carries with it a high risk of complications and morbidity.    The differential diagnosis includes but not limited to Differential includes all life-threatening causes for chest pain/palpitations. This includes but is not exclusive to acute coronary syndrome, aortic dissection, pulmonary embolism, cardiac tamponade, community-acquired pneumonia, pericarditis, musculoskeletal chest wall pain, endocrine, metabolic, thyroid, electrolyte derangement, psychogenic etc. . Serious etiologies were considered.   The  initial plan is to screening labs/imaging   Additional history obtained: Additional history obtained from  na Records reviewed Primary Care Documents and prior labs/imaging/home meds  Independent labs interpretation:  The following labs were independently interpreted:  Trop neg x1 Cbc and bmp are stable Rpv neg TSH is 4.6, free t 4 is pending>> low suspicion for thyroid storm or myxad coma   Independent visualization of imaging: - I independently visualized the following imaging with scope of interpretation limited to determining acute life threatening conditions related to emergency care: cxr, which revealed no acute process  Cardiac monitoring was reviewed and interpreted by myself which shows nsr  Treatment and Reassessment: Stayed the same, she has been asymptomatic for duration of her stay No change with ambulation or exertion  Consultation: - Consulted or discussed management/test interpretation w/ external professional: na  Consideration for admission or further workup: Admission was considered   Pt here with intermittent palpitations, she has had these symptoms for >6 mos, she is currently asymptomatic, labs are re-assuring. Did recommend further cardiac monitoring but pt has a prior commitment and has to leave. No chest pain or DIB. Low suspicion for ACS as etiology of complaints today. She remains asymptomatic. Advised her to follow up with cardiologist   PERC negative  The patient improved significantly and was discharged in stable condition. Detailed discussions were had with the patient regarding current findings, and need for close f/u with PCP or on call doctor. The patient has been instructed to return immediately if the symptoms worsen in any way for re-evaluation. Patient verbalized understanding and is in agreement with current care plan. All questions answered prior to discharge.    Social Determinants of health: Social History   Tobacco  Use   Smoking  status: Former    Packs/day: 1.00    Years: 26.00    Total pack years: 26.00    Types: Cigarettes    Quit date: 01/07/2021    Years since quitting: 1.1   Smokeless tobacco: Former  Scientific laboratory technician Use: Every day   Substances: Nicotine, Flavoring  Substance Use Topics   Alcohol use: Yes    Alcohol/week: 0.0 standard drinks of alcohol    Comment: occasional   Drug use: Not Currently    Types: Marijuana    Comment: occasional            Final Clinical Impression(s) / ED Diagnoses Final diagnoses:  Palpitations    Rx / DC Orders ED Discharge Orders          Ordered    Ambulatory referral to Cardiology        03/06/22 [place], [place], DO 03/06/22 [place], [place], DO 03/06/22 1254

## 2022-03-06 NOTE — ED Triage Notes (Signed)
Patient states she felt like her heart was having palpitations this morning at 0730 with lightheadedness noted during episodes. Patient states chest fullness.

## 2022-03-14 ENCOUNTER — Encounter: Payer: Self-pay | Admitting: Physical Medicine and Rehabilitation

## 2022-03-14 ENCOUNTER — Telehealth: Payer: Self-pay

## 2022-03-14 VITALS — BP 133/92 | HR 72 | Ht 67.0 in | Wt 234.0 lb

## 2022-03-14 DIAGNOSIS — M722 Plantar fascial fibromatosis: Secondary | ICD-10-CM

## 2022-03-14 MED ORDER — OXYCODONE-ACETAMINOPHEN 5-325 MG PO TABS: 1.0000 | ORAL_TABLET | Freq: Two times a day (BID) | ORAL | 0 refills | Status: DC | PRN

## 2022-03-14 NOTE — Progress Notes (Signed)
ECSWT. 4th session for right foot plantar fasciitis  Frequency 5-10 Hz Power: 60-58mJ 2500 shocks

## 2022-03-14 NOTE — Telephone Encounter (Signed)
Transition Care Management Follow-up Telephone Call Date of discharge and from where: [facility] ER; 03/06/22 How have you been since you were released from the hospital? Stable  Any questions or concerns? No  Items Reviewed: Did the pt receive and understand the discharge instructions provided? Yes  Medications obtained and verified? Yes  Other? No  Any new allergies since your discharge? No  Dietary orders reviewed? Yes Do you have support at home? Yes    Follow up appointments reviewed:  PCP Hospital f/u appt confirmed? Yes  Scheduled to see Dr. Adriana Simas on 03/21/22 @ 2:10.. Are transportation arrangements needed? No  If their condition worsens, is the pt aware to call PCP or go to the Emergency Dept.? Yes Was the patient provided with contact information for the PCP's office or ED? Yes Was to pt encouraged to call back with questions or concerns? Yes

## 2022-03-15 ENCOUNTER — Other Ambulatory Visit: Payer: Self-pay

## 2022-03-15 ENCOUNTER — Emergency Department (HOSPITAL_COMMUNITY)
Admission: EM | Admit: 2022-03-15 | Discharge: 2022-03-15 | Discharge status: Against Medical Advice | Payer: BC Managed Care – PPO | Attending: Emergency Medicine | Admitting: Emergency Medicine

## 2022-03-15 ENCOUNTER — Encounter (HOSPITAL_COMMUNITY): Payer: Self-pay

## 2022-03-15 DIAGNOSIS — R6884 Jaw pain: Secondary | ICD-10-CM | POA: Diagnosis not present

## 2022-03-15 DIAGNOSIS — Z5321 Procedure and treatment not carried out due to patient leaving prior to being seen by health care provider: Secondary | ICD-10-CM | POA: Diagnosis not present

## 2022-03-15 DIAGNOSIS — R0789 Other chest pain: Secondary | ICD-10-CM | POA: Insufficient documentation

## 2022-03-15 DIAGNOSIS — M542 Cervicalgia: Secondary | ICD-10-CM | POA: Insufficient documentation

## 2022-03-15 NOTE — ED Triage Notes (Signed)
Patient states she woke up with pain in neck and jaw this morning at 10:00 with radiation to left arm and left breast. Patient states come chest tightness. Ambulatory in triage. No neuro deficits noted.

## 2022-03-15 NOTE — ED Notes (Signed)
Dr. Wallace Cullens in triage to assess patient.

## 2022-03-15 NOTE — ED Notes (Signed)
Last known well time 2130 last night.

## 2022-03-21 ENCOUNTER — Encounter: Payer: Self-pay | Admitting: Family Medicine

## 2022-03-21 ENCOUNTER — Ambulatory Visit: Payer: BC Managed Care – PPO | Admitting: Family Medicine

## 2022-03-21 VITALS — BP 132/74 | HR 71 | Temp 98.4°F | Wt 235.6 lb

## 2022-03-21 DIAGNOSIS — I471 Supraventricular tachycardia, unspecified: Secondary | ICD-10-CM

## 2022-03-21 DIAGNOSIS — F411 Generalized anxiety disorder: Secondary | ICD-10-CM

## 2022-03-21 MED ORDER — ALPRAZOLAM 0.5 MG PO TABS: 0.2500 mg | ORAL_TABLET | Freq: Two times a day (BID) | ORAL | 3 refills | Status: DC | PRN

## 2022-03-21 NOTE — Patient Instructions (Signed)
Call cardiology for an appt.  I change to Xanax.  Follow up in 3-6 months.  Take care  Dr. Lacinda Axon

## 2022-03-21 NOTE — Assessment & Plan Note (Signed)
Patient has a history of SVT.  Discussed increasing metoprolol.  Patient prefers to see cardiology and wait on increasing the dose.  Continue current dosing of metoprolol.  Advised to call cardiologist for an appointment.

## 2022-03-21 NOTE — Progress Notes (Signed)
Subjective:  Patient ID: Teresa Vincent, female    DOB: 1980/10/24  Age: 42 y.o. MRN: [id number]  CC: Chief Complaint  Patient presents with   Hospitalization Follow-up    Pt went to ER on 03/06/22 for increased palpitations. Pt reports light headedness and palpitations. No changes in med. Palpitation due to anxiety? Pt would like to be placed on Xanax and d/c Klonopin-pt states Klonopin is not working.     HPI:  42 year old female with the below mentioned medical problems presents for follow-up from recent ER visit.  Patient recently seen in the ER on 12/18.  She presented with palpitations and feelings of dizziness/lightheadedness.  Workup unrevealing.  Her TSH was slightly elevated.  Follows with endocrinology.  No changes to her medications were made.  She was advised to follow-up with cardiology.  Patient presents today for follow-up regarding this.  She states that things have improved but she still has intermittent palpitations which are likely from PVCs.  Has ongoing lightheadedness/dizziness as well.  She is unsure if these are related to her PVCs or secondary to anxiety.  Patient takes Klonopin.  She states that this medication does not work well for her and makes her drowsy.  She would like to change his medication back to alprazolam which she was on for many years previously.  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   Anxiety state  08/30/2007   Migraine headache 08/30/2007   Insomnia 08/30/2007    Social Hx   Social History   Socioeconomic History   Marital status: Married    Spouse name: Not on file   Number of children: Not on file   Years of education: Not on file   Highest education level: Not on file  Occupational History   Occupation: Doesn't work outside the home  Tobacco Use   Smoking status: Former    Packs/day: 1.00    Years: 26.00    Total pack years: 26.00    Types: Cigarettes    Quit date: 01/07/2021    Years since quitting: 1.2   Smokeless tobacco: Former  Scientific laboratory technician Use: Every day   Substances: Nicotine, Flavoring  Substance and Sexual Activity   Alcohol use: Yes    Alcohol/week: 0.0 standard drinks of alcohol    Comment: occasional   Drug use: Not Currently    Types: Marijuana    Comment: occasional   Sexual activity: Not Currently    Birth control/protection: Surgical  Other Topics Concern   Not on file  Social History Narrative   Not on file   Social Determinants of Health   Financial Resource Strain: Not on file  Food Insecurity: Not on file  Transportation Needs: Not on file  Physical Activity: Not on file  Stress: Not on file  Social Connections: Not on file    Review of Systems Per HPI  Objective:  BP 132/74   Pulse 71   Temp 98.4 F (36.9 C)  Wt 235 lb 9.6 oz (106.9 kg)   LMP 02/25/2016   SpO2 96%   BMI 36.90 kg/m      03/21/2022    2:19 PM 03/15/2022    4:17 PM 03/15/2022    4:12 PM  BP/Weight  Systolic BP 270 623   Diastolic BP 74 90   Wt. (Lbs) 235.6  233.69  BMI 36.9 kg/m2  36.6 kg/m2    Physical Exam Vitals and nursing note reviewed.  Constitutional:      Appearance: Normal appearance. She is obese.  Eyes:     General:        Right eye: No discharge.        Left eye: No discharge.     Conjunctiva/sclera: Conjunctivae normal.  Cardiovascular:     Rate and Rhythm: Normal rate and regular rhythm.     Heart sounds: No murmur  heard. Pulmonary:     Effort: Pulmonary effort is normal.     Breath sounds: Normal breath sounds. No wheezing, rhonchi or rales.  Neurological:     Mental Status: She is alert.  Psychiatric:     Comments: Anxious.     Lab Results  Component Value Date   WBC 9.2 03/06/2022   HGB 13.1 03/06/2022   HCT 39.9 03/06/2022   PLT 257 03/06/2022   GLUCOSE 123 (H) 03/06/2022   CHOL 100 01/26/2022   TRIG 106 01/26/2022   HDL 30 (L) 01/26/2022   LDLDIRECT 134 (H) 10/31/2021   LDLCALC 51 01/26/2022   ALT 21 10/31/2021   AST 21 10/31/2021   NA 137 03/06/2022   K 4.1 03/06/2022   CL 105 03/06/2022   CREATININE 0.84 03/06/2022   BUN 11 03/06/2022   CO2 25 03/06/2022   TSH 4.615 (H) 03/06/2022   INR 1.0 10/31/2021   HGBA1C 6.2 (H) 01/26/2022     Assessment & Plan:   Problem List Items Addressed This Visit       Cardiovascular and Mediastinum   SVT (supraventricular tachycardia) - Primary    Patient has a history of SVT.  Discussed increasing metoprolol.  Patient prefers to see cardiology and wait on increasing the dose.  Continue current dosing of metoprolol.  Advised to call cardiologist for an appointment.        Other   Anxiety state    Stopping Klonopin due to side effects.  Resuming alprazolam.      Relevant Medications   ALPRAZolam (XANAX) 0.5 MG tablet    Meds ordered this encounter  Medications   ALPRAZolam (XANAX) 0.5 MG tablet    Sig: Take 0.5-1 tablets (0.25-0.5 mg total) by mouth 2 (two) times daily as needed for anxiety.    Dispense:  30 tablet    Refill:  3    Follow-up:  3-6 months  [place]

## 2022-03-21 NOTE — Assessment & Plan Note (Signed)
Stopping Klonopin due to side effects.  Resuming alprazolam.

## 2022-03-27 ENCOUNTER — Telehealth: Payer: Self-pay | Admitting: Physical Medicine and Rehabilitation

## 2022-03-27 NOTE — Telephone Encounter (Signed)
Patient called to reschedule her shockwave appointment due to weather tomorrow 1/9, added patient on wait list to see if we could get her in before 2/12, pt asked if during her follow up appointment if she could also do the shockwave procedure ?

## 2022-03-28 ENCOUNTER — Encounter: Payer: BC Managed Care – PPO | Admitting: Physical Medicine and Rehabilitation

## 2022-04-07 ENCOUNTER — Ambulatory Visit (INDEPENDENT_AMBULATORY_CARE_PROVIDER_SITE_OTHER): Payer: BC Managed Care – PPO | Admitting: Nurse Practitioner

## 2022-04-07 VITALS — BP 128/70 | HR 75 | Temp 98.6°F | Wt 239.6 lb

## 2022-04-07 DIAGNOSIS — N644 Mastodynia: Secondary | ICD-10-CM | POA: Diagnosis not present

## 2022-04-07 DIAGNOSIS — Z1231 Encounter for screening mammogram for malignant neoplasm of breast: Secondary | ICD-10-CM | POA: Diagnosis not present

## 2022-04-07 NOTE — Progress Notes (Signed)
   Subjective:    Patient ID: Teresa Vincent, female    DOB: 07-20-1980, 42 y.o.   MRN: [id number]  HPI Patient arrives today with bilateral breast pain. Patient states pain started a month and a half ago. Patient states it feels like it is on the chest wall. Patient denies any nipple discharge, lumps or nipple color changes. No alleviating factors.  Unassociated with movement or activity. 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  No specific history of trauma.  Discomfort is more in the left breast than the right mainly on the upper left outer area.  No family history of breast cancer.  Her record indicates that she has had a hysterectomy with BSO.  Denies any nipple drainage erythema warmth or obvious masses.  Review of Systems  Constitutional:  Negative for appetite change, chills, fatigue and fever.  Respiratory:  Negative for cough, chest tightness and shortness of breath.   Cardiovascular:  Negative for chest pain and palpitations.      Objective:   Physical Exam Constitutional:      Appearance: Normal appearance. She is not ill-appearing.  Cardiovascular:     Rate and Rhythm: Normal rate and regular rhythm.     Pulses: Normal pulses.     Heart sounds: Normal heart sounds.  Pulmonary:     Effort: Pulmonary effort is normal.     Breath sounds: Normal breath sounds.  Chest:  Breasts:    Right: Tenderness present. No swelling, inverted nipple, nipple discharge or skin change.     Left: No swelling, inverted nipple, nipple discharge, skin change or tenderness.     Comments: Diffuse, fine nodularity in the upper outer quadrants in bilateral breasts.  Pinpoint area of tenderness noted in the right breast approximately 10:00 2 cm away from the areola.  No distinct masses noted.  There is diffuse nodularity in the area. Lymphadenopathy:     Upper Body:     Right upper body: No supraclavicular, axillary or pectoral  adenopathy.     Left upper body: No supraclavicular, axillary or pectoral adenopathy.  Skin:    General: Skin is warm and dry.  Neurological:     Mental Status: She is alert.  Psychiatric:        Mood and Affect: Mood normal.        Behavior: Behavior normal.        Thought Content: Thought content normal.        Judgment: Judgment normal.      Assessment & Plan:   Problem List Items Addressed This Visit       Other   Breast pain   Other Visit Diagnoses     Encounter for screening mammogram for malignant neoplasm of breast    -  Primary   Relevant Orders   MM 3D SCREEN BREAST BILATERAL      Start with a screening mammogram since patient is due for for this.  It has been recommended that she has yearly mammograms.  Based on these results and if she has continued symptoms, we will proceed with further evaluation.  Warning signs reviewed.  Patient to call back in the meantime if any problems. Return if symptoms worsen or fail to improve.

## 2022-04-07 NOTE — Progress Notes (Signed)
   Subjective:    Patient ID: Teresa Vincent, female    DOB: [birth year], 42 y.o.   MRN: [id number]  HPI Patient arrives today with bilateral breast pain. Patient states pain started a mouth 1/2 ago. Patient denies any nipple discharge, lumps or nipple color changes.    Review of Systems     Objective:   Physical Exam        Assessment & Plan:

## 2022-04-08 ENCOUNTER — Encounter: Payer: Self-pay | Admitting: Nurse Practitioner

## 2022-04-08 DIAGNOSIS — N644 Mastodynia: Secondary | ICD-10-CM | POA: Insufficient documentation

## 2022-04-10 ENCOUNTER — Other Ambulatory Visit (HOSPITAL_COMMUNITY): Payer: Self-pay | Admitting: Nurse Practitioner

## 2022-04-10 ENCOUNTER — Encounter (HOSPITAL_COMMUNITY): Payer: Self-pay

## 2022-04-10 ENCOUNTER — Ambulatory Visit (HOSPITAL_COMMUNITY): Admission: RE | Admit: 2022-04-10 | Payer: BC Managed Care – PPO | Source: Ambulatory Visit

## 2022-04-10 ENCOUNTER — Ambulatory Visit: Payer: BC Managed Care – PPO | Admitting: Orthopedic Surgery

## 2022-04-10 DIAGNOSIS — N644 Mastodynia: Secondary | ICD-10-CM

## 2022-04-15 DIAGNOSIS — Z6837 Body mass index (BMI) 37.0-37.9, adult: Secondary | ICD-10-CM | POA: Diagnosis not present

## 2022-04-15 DIAGNOSIS — U071 COVID-19: Secondary | ICD-10-CM | POA: Diagnosis not present

## 2022-04-15 DIAGNOSIS — E669 Obesity, unspecified: Secondary | ICD-10-CM | POA: Diagnosis not present

## 2022-04-15 DIAGNOSIS — R0981 Nasal congestion: Secondary | ICD-10-CM | POA: Diagnosis not present

## 2022-04-15 DIAGNOSIS — R030 Elevated blood-pressure reading, without diagnosis of hypertension: Secondary | ICD-10-CM | POA: Diagnosis not present

## 2022-04-19 ENCOUNTER — Other Ambulatory Visit: Payer: Self-pay | Admitting: Physical Medicine and Rehabilitation

## 2022-04-19 MED ORDER — OXYCODONE-ACETAMINOPHEN 5-325 MG PO TABS: 1.0000 | ORAL_TABLET | Freq: Two times a day (BID) | ORAL | 0 refills | Status: DC | PRN

## 2022-04-25 ENCOUNTER — Encounter
Payer: BC Managed Care – PPO | Attending: Physical Medicine and Rehabilitation | Admitting: Physical Medicine and Rehabilitation

## 2022-04-25 VITALS — BP 126/69 | HR 64 | Ht 67.0 in

## 2022-04-25 DIAGNOSIS — M722 Plantar fascial fibromatosis: Secondary | ICD-10-CM | POA: Insufficient documentation

## 2022-04-25 NOTE — Patient Instructions (Signed)
[employer] Pre-Dx L-methylfolate PEA

## 2022-04-25 NOTE — Progress Notes (Signed)
ECSWT. 5th session for right foot plantar fasciitis  Frequency 5-10 Hz Power: 60-7mJ 2500 shocks

## 2022-05-01 ENCOUNTER — Encounter: Payer: BC Managed Care – PPO | Admitting: Physical Medicine and Rehabilitation

## 2022-05-02 DIAGNOSIS — E6609 Other obesity due to excess calories: Secondary | ICD-10-CM | POA: Diagnosis not present

## 2022-05-02 DIAGNOSIS — I10 Essential (primary) hypertension: Secondary | ICD-10-CM | POA: Diagnosis not present

## 2022-05-02 DIAGNOSIS — I471 Supraventricular tachycardia, unspecified: Secondary | ICD-10-CM | POA: Diagnosis not present

## 2022-05-02 DIAGNOSIS — G4733 Obstructive sleep apnea (adult) (pediatric): Secondary | ICD-10-CM | POA: Diagnosis not present

## 2022-05-04 ENCOUNTER — Ambulatory Visit (HOSPITAL_COMMUNITY)
Admission: RE | Admit: 2022-05-04 | Discharge: 2022-05-04 | Disposition: A | Discharge status: Home / Self Care | Payer: BC Managed Care – PPO | Source: Ambulatory Visit | Attending: Nurse Practitioner | Admitting: Nurse Practitioner

## 2022-05-04 DIAGNOSIS — R7303 Prediabetes: Secondary | ICD-10-CM | POA: Diagnosis not present

## 2022-05-04 DIAGNOSIS — N644 Mastodynia: Secondary | ICD-10-CM | POA: Diagnosis not present

## 2022-05-04 DIAGNOSIS — E890 Postprocedural hypothyroidism: Secondary | ICD-10-CM | POA: Diagnosis not present

## 2022-05-04 DIAGNOSIS — E782 Mixed hyperlipidemia: Secondary | ICD-10-CM | POA: Diagnosis not present

## 2022-05-05 LAB — TSH: TSH: 5.74 mIU/L — ABNORMAL HIGH

## 2022-05-05 LAB — T4, FREE: Free T4: 1 ng/dL (ref 0.8–1.8)

## 2022-05-05 LAB — HEMOGLOBIN A1C
Hgb A1c MFr Bld: 6.1 % of total Hgb — ABNORMAL HIGH (ref <5.7)
Mean Plasma Glucose: 128 mg/dL
eAG (mmol/L): 7.1 mmol/L

## 2022-05-05 LAB — LIPID PANEL
Cholesterol: 229 mg/dL — ABNORMAL HIGH (ref <200)
HDL: 46 mg/dL — ABNORMAL LOW (ref >=50)
LDL Cholesterol (Calc): 145 mg/dL (calc) — ABNORMAL HIGH
Non-HDL Cholesterol (Calc): 183 mg/dL (calc) — ABNORMAL HIGH (ref <130)
Total CHOL/HDL Ratio: 5 (calc) — ABNORMAL HIGH (ref <5.0)
Triglycerides: 248 mg/dL — ABNORMAL HIGH (ref <150)

## 2022-05-06 ENCOUNTER — Other Ambulatory Visit: Payer: Self-pay | Admitting: "Endocrinology

## 2022-05-06 DIAGNOSIS — E890 Postprocedural hypothyroidism: Secondary | ICD-10-CM

## 2022-05-10 ENCOUNTER — Ambulatory Visit: Payer: BC Managed Care – PPO | Admitting: "Endocrinology

## 2022-05-11 ENCOUNTER — Encounter: Payer: Self-pay | Admitting: "Endocrinology

## 2022-05-11 ENCOUNTER — Ambulatory Visit: Payer: BC Managed Care – PPO | Admitting: "Endocrinology

## 2022-05-11 VITALS — BP 138/76 | HR 68 | Ht 67.0 in | Wt 242.4 lb

## 2022-05-11 DIAGNOSIS — E782 Mixed hyperlipidemia: Secondary | ICD-10-CM | POA: Diagnosis not present

## 2022-05-11 DIAGNOSIS — R7303 Prediabetes: Secondary | ICD-10-CM

## 2022-05-11 DIAGNOSIS — E890 Postprocedural hypothyroidism: Secondary | ICD-10-CM

## 2022-05-11 NOTE — Patient Instructions (Signed)

## 2022-05-11 NOTE — Progress Notes (Signed)
05/11/2022    Endocrinology follow-up note   Subjective:    Patient ID: Teresa Vincent, female    DOB: Dec 25, 1980, PCP Mikey Kirschner, MD  Past Medical History:  Diagnosis Date   Abnormal Pap smear of cervix    ANXIETY 08/30/2007   Asthma    Chronic back pain    Claustrophobia    pt requests Versed before surgery.   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     Past Surgical History:  Procedure Laterality Date   ABDOMINAL EXPOSURE N/A 05/29/2017   Procedure: ABDOMINAL EXPOSURE;  Surgeon: Angelia Mould, MD;  [facility]: [facility] OR;  Service: Vascular;  Laterality: N/A;   ABDOMINAL HYSTERECTOMY     ANTERIOR LUMBAR FUSION N/A 05/29/2017   Procedure: Anterior Lumbar Interbody Fusion  - Lumbar five sacral one;  Surgeon: Eustace Moore, MD;  Location: [place];  Service: Neurosurgery;  Laterality: N/A;   BIOPSY  02/17/2020   Procedure: BIOPSY;  Surgeon: Eloise Harman, DO;  [facility];  Service: Endoscopy;;  gastric   CARPAL TUNNEL RELEASE Bilateral    CHOLECYSTECTOMY     CHOLECYSTECTOMY N/A 12/29/2016   Procedure: LAPAROSCOPIC CHOLECYSTECTOMY;  Surgeon: Virl Cagey, MD;  Location: [facility];  Service: General;  Laterality: N/A;   COLONOSCOPY WITH PROPOFOL N/A 02/17/2020   non-bleeding internal hemorrhoids, otherwise normal.   CYSTOSCOPY/URETEROSCOPY/HOLMIUM LASER/STENT PLACEMENT Right 06/25/2019   Procedure: CYSTOSCOPY WITH RIGHT RETROGRADE PYELOGRAM/RIGHT URETEROSCOPY/HOLMIUM LASER APPLICATION RIGHT URETERAL CALCULUS/RIGHT URETERAL STENT PLACEMENT;  Surgeon: Cleon Gustin, MD;   Location: [facility];  Service: Urology;  Laterality: Right;   ESOPHAGOGASTRODUODENOSCOPY (EGD) WITH PROPOFOL N/A 02/17/2020   Procedure: ESOPHAGOGASTRODUODENOSCOPY (EGD) WITH PROPOFOL;  Surgeon: Eloise Harman, DO;  [facility];  Service: Endoscopy;  Laterality: N/A;   EXTRACORPOREAL SHOCK WAVE LITHOTRIPSY Right 04/10/2019   Procedure: EXTRACORPOREAL SHOCK WAVE LITHOTRIPSY (ESWL);  Surgeon: Robley Fries, MD;  [facility];  Service: Urology;  Laterality: Right;   LAMINECTOMY WITH POSTERIOR LATERAL ARTHRODESIS LEVEL 1 N/A 02/28/2018   Procedure: Laminectomy and Foraminotomy Lumbar Five-Sacral One, posterolateral fusion and fixation;  Surgeon: Eustace Moore, MD;  Location: [place];  Service: Neurosurgery;  Laterality: N/A;  posterior   LUMBAR EPIDURAL INJECTION     STERIOD INJECTION N/A 01/10/2017   Procedure: MINOR EXPAREL INJECTION;  Surgeon: Virl Cagey, MD;  Location: [facility];  Service: General;  Laterality: N/A;   TOTAL ABDOMINAL HYSTERECTOMY W/ BILATERAL SALPINGOOPHORECTOMY N/A    TUBAL LIGATION  2003   Social History   Social History   Socioeconomic History   Marital status: Married    Spouse name: Not on file   Number of children: Not on file   Years of education: Not on file   Highest education level: Not on file  Occupational History   Occupation: Doesn't work outside the home  Tobacco Use   Smoking status: Former    Packs/day: 1.00    Years: 26.00    Total pack years: 26.00  Types: Cigarettes    Quit date: 01/07/2021    Years since quitting: 1.3   Smokeless tobacco: Former  Scientific laboratory technician Use: Every day   Substances: Nicotine, Flavoring  Substance and Sexual Activity   Alcohol use: Yes    Alcohol/week: 0.0 standard drinks of alcohol    Comment: occasional   Drug use: Not Currently    Types: Marijuana    Comment: occasional   Sexual activity: Not Currently    Birth control/protection: Surgical  Other Topics  Concern   Not on file  Social History Narrative   Not on file   Social Determinants of Health   Financial Resource Strain: Not on file  Food Insecurity: Not on file  Transportation Needs: Not on file  Physical Activity: Not on file  Stress: Not on file  Social Connections: Not on file  Intimate Partner Violence: Not on file     Current Outpatient Medications  Medication Instructions   albuterol (VENTOLIN HFA) 108 (90 Base) MCG/ACT inhaler 1-2 puffs, Inhalation, Every 4 hours PRN   ALPRAZolam (XANAX) 0.25-0.5 mg, Oral, 2 times daily PRN   cetirizine (ZYRTEC) 10 mg, Oral, Daily   Cholecalciferol 50 MCG (2000 UT) CAPS 1 capsule, Oral, Daily   levothyroxine (SYNTHROID) 175 mcg, Oral, Daily before breakfast   metoprolol succinate (TOPROL-XL) 50 MG 24 hr tablet TAKE 1 TABLET BY MOUTH ONCE DAILY WITH OR IMMEDIATELY FOLLOWING A MEAL   omeprazole (PRILOSEC) 40 MG capsule TAKE 1 CAPSULE BY MOUTH TWICE DAILY BEFORE A MEAL   ondansetron (ZOFRAN-ODT) 4 MG disintegrating tablet DISSOLVE 1 TABLET IN MOUTH EVERY 8 HOURS AS NEEDED   oxyCODONE-acetaminophen (PERCOCET) 5-325 MG tablet 1 tablet, Oral, 2 times daily PRN   SUMAtriptan (IMITREX) 100 MG tablet 1 tablet at the first sign of migraine. Additional dose in 2 hours if headache persists or recurs.     HPI  [name] is a 42 year old female patient with medical history as above.  She is being seen in follow-up for hypothyroidism related to remote past thyroid ablation with I-131 for Graves' disease.  Hyperlipidemia, prediabetes, hypertension. 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 She was treated with various doses of levothyroxine over the years .  He is currently on levothyroxine 175 mcg p.o. daily before breakfast.   She reports consistency and compliance with medication.  Her previsit labs/consistent with slight under replacement, however patient is dealing  with hot flashes likely related to premature menopause from complete hysterectomy she underwent in her 68s.    She does have severe dyslipidemia, wishes to avoid statin treatment.  She has disengaged from lifestyle medicine which led to loss of control of lipid panel.  See below. She was  recently  diagnosed with CVA for which she was initiated on Plavix.   She has prediabetes with A1c of 6.2%.   She did not stay on metformin.     Objective:     HEENT: She has prominent eyes, however no significant lid lag.  Recent Results (from the past 2160 hour(s))  Resp panel by RT-PCR (RSV, Flu A&B, Covid) Anterior Nasal Swab     Status: None   Collection Time: 03/06/22 11:25 AM   Specimen: Anterior Nasal Swab  Result Value Ref Range   SARS Coronavirus 2 by RT PCR NEGATIVE NEGATIVE    Comment: (NOTE) SARS-CoV-2 target nucleic acids are NOT DETECTED.  The SARS-CoV-2 RNA is generally detectable  in upper respiratory specimens during the acute phase of infection. The lowest concentration of SARS-CoV-2 viral copies this assay can detect is 138 copies/mL. A negative result does not preclude SARS-Cov-2 infection and should not be used as the sole basis for treatment or other patient management decisions. A negative result may occur with  improper specimen collection/handling, submission of specimen other than nasopharyngeal swab, presence of viral mutation(s) within the areas targeted by this assay, and inadequate number of viral copies(<138 copies/mL). A negative result must be combined with clinical observations, patient history, and epidemiological information. The expected result is Negative.  Fact Sheet for Patients:  EntrepreneurPulse.com.au  Fact Sheet for Healthcare Providers:  IncredibleEmployment.be  This test is no t yet approved or cleared by the [place] FDA and  has been authorized for detection and/or diagnosis of SARS-CoV-2 by FDA under  an Emergency Use Authorization (EUA). This EUA will remain  in effect (meaning this test can be used) for the duration of the COVID-19 declaration under Section 564(b)(1) of the Act, 21 U.S.C.section 360bbb-3(b)(1), unless the authorization is terminated  or revoked sooner.       Influenza A by PCR NEGATIVE NEGATIVE   Influenza B by PCR NEGATIVE NEGATIVE    Comment: (NOTE) The Xpert Xpress SARS-CoV-2/FLU/RSV plus assay is intended as an aid in the diagnosis of influenza from Nasopharyngeal swab specimens and should not be used as a sole basis for treatment. Nasal washings and aspirates are unacceptable for Xpert Xpress SARS-CoV-2/FLU/RSV testing.  Fact Sheet for Patients: EntrepreneurPulse.com.au  Fact Sheet for Healthcare Providers: IncredibleEmployment.be  This test is not yet approved or cleared by the [place] FDA and has been authorized for detection and/or diagnosis of SARS-CoV-2 by FDA under an Emergency Use Authorization (EUA). This EUA will remain in effect (meaning this test can be used) for the duration of the COVID-19 declaration under Section 564(b)(1) of the Act, 21 U.S.C. section 360bbb-3(b)(1), unless the authorization is terminated or revoked.     Resp Syncytial Virus by PCR NEGATIVE NEGATIVE    Comment: (NOTE) Fact Sheet for Patients: EntrepreneurPulse.com.au  Fact Sheet for Healthcare Providers: IncredibleEmployment.be  This test is not yet approved or cleared by the [place] FDA and has been authorized for detection and/or diagnosis of SARS-CoV-2 by FDA under an Emergency Use Authorization (EUA). This EUA will remain in effect (meaning this test can be used) for the duration of the COVID-19 declaration under Section 564(b)(1) of the Act, 21 U.S.C. section 360bbb-3(b)(1), unless the authorization is terminated or revoked.  Performed at [facility], [address]  [place], [place] [id number]   Basic metabolic panel     Status: Abnormal   Collection Time: 03/06/22 11:42 AM  Result Value Ref Range   Sodium 137 135 - 145 mmol/L   Potassium 4.1 3.5 - 5.1 mmol/L   Chloride 105 98 - 111 mmol/L   CO2 25 22 - 32 mmol/L   Glucose, Bld 123 (H) 70 - 99 mg/dL    Comment: Glucose reference range applies only to samples taken after fasting for at least 8 hours.   BUN 11 6 - 20 mg/dL   Creatinine, Ser 0.84 0.44 - 1.00 mg/dL   Calcium 9.2 8.9 - 10.3 mg/dL   GFR, Estimated >60 >60 mL/min    Comment: (NOTE) Calculated using the CKD-EPI Creatinine Equation (2021)    Anion gap 7 5 - 15    Comment: Performed at [facility], [address] [place], [place] [address]  Troponin I (High Sensitivity)     Status: None   Collection Time: 03/06/22 11:42 AM  Result Value Ref Range   Troponin I (High Sensitivity) <2 <18 ng/L    Comment: (NOTE) Elevated high sensitivity troponin I (hsTnI) values and significant  changes across serial measurements may suggest ACS but many other  chronic and acute conditions are known to elevate hsTnI results.  Refer to the "Links" section for chest pain algorithms and additional  guidance. Performed at [facility], [address], [place], [place] [address]   CBC with Differential     Status: None   Collection Time: 03/06/22 11:42 AM  Result Value Ref Range   WBC 9.2 4.0 - 10.5 K/uL   RBC 4.46 3.87 - 5.11 MIL/uL   Hemoglobin 13.1 12.0 - 15.0 g/dL   HCT 39.9 36.0 - 46.0 %   MCV 89.5 80.0 - 100.0 fL   MCH 29.4 26.0 - 34.0 pg   MCHC 32.8 30.0 - 36.0 g/dL   RDW 13.0 11.5 - 15.5 %   Platelets 257 150 - 400 K/uL   nRBC 0.0 0.0 - 0.2 %   Neutrophils Relative % 65 %   Neutro Abs 6.0 1.7 - 7.7 K/uL   Lymphocytes Relative 27 %   Lymphs Abs 2.5 0.7 - 4.0 K/uL   Monocytes Relative 6 %   Monocytes Absolute 0.6 0.1 - 1.0 K/uL   Eosinophils Relative 1 %   Eosinophils Absolute 0.1 0.0 - 0.5 K/uL   Basophils Relative 0 %   Basophils Absolute  0.0 0.0 - 0.1 K/uL   Immature Granulocytes 1 %   Abs Immature Granulocytes 0.05 0.00 - 0.07 K/uL    Comment: Performed at [facility], [address] [place], [place] [address]  TSH     Status: Abnormal   Collection Time: 03/06/22 11:42 AM  Result Value Ref Range   TSH 4.615 (H) 0.350 - 4.500 uIU/mL    Comment: Performed by a 3rd Generation assay with a functional sensitivity of <=0.01 uIU/mL. Performed at [facility], [address] [place], [place] [address]   T4, free     Status: None   Collection Time: 03/06/22 11:42 AM  Result Value Ref Range   Free T4 0.80 0.61 - 1.12 ng/dL    Comment: (NOTE) Biotin ingestion may interfere with free T4 tests. If the results are inconsistent with the TSH level, previous test results, or the clinical presentation, then consider biotin interference. If needed, order repeat testing after stopping biotin. Performed at [facility], [place] [address], [place], [place] [address]   TSH     Status: Abnormal   Collection Time: 05/04/22  7:05 AM  Result Value Ref Range   TSH 5.74 (H) mIU/L    Comment:           Reference Range .           > or = 20 Years  0.40-4.50 .                Pregnancy Ranges           First trimester    0.26-2.66           Second trimester   0.55-2.73           Third trimester    0.43-2.91   T4, free     Status: None   Collection Time: 05/04/22  7:05 AM  Result Value Ref Range   Free T4 1.0  0.8 - 1.8 ng/dL  Lipid panel     Status: Abnormal   Collection Time: 05/04/22  7:05 AM  Result Value Ref Range   Cholesterol 229 (H) <200 mg/dL   HDL 46 (L) > OR = 50 mg/dL   Triglycerides 248 (H) <150 mg/dL    Comment: . If a non-fasting specimen was collected, consider repeat triglyceride testing on a fasting specimen if clinically indicated.  Yates Decamp et al. J. of Clin. Lipidol. [id number]. Marland Kitchen    LDL Cholesterol (Calc) 145 (H) mg/dL (calc)    Comment: Reference range: <100 . Desirable range <100 mg/dL for  primary prevention;   <70 mg/dL for patients with CHD or diabetic patients  with > or = 2 CHD risk factors. Marland Kitchen LDL-C is now calculated using the Martin-Hopkins  calculation, which is a validated novel method providing  better accuracy than the Friedewald equation in the  estimation of LDL-C.  Cresenciano Genre et al. Annamaria Helling. [id number]): 2061-2068  (http://education.[facility].com/faq/FAQ164)    Total CHOL/HDL Ratio 5.0 (H) <5.0 (calc)   Non-HDL Cholesterol (Calc) 183 (H) <130 mg/dL (calc)    Comment: For patients with diabetes plus 1 major ASCVD risk  factor, treating to a non-HDL-C goal of <100 mg/dL  (LDL-C of <70 mg/dL) is considered a therapeutic  option.   Hemoglobin A1c     Status: Abnormal   Collection Time: 05/04/22  7:21 AM  Result Value Ref Range   Hgb A1c MFr Bld 6.1 (H) <5.7 % of total Hgb    Comment: For someone without known diabetes, a hemoglobin  A1c value between 5.7% and 6.4% is consistent with prediabetes and should be confirmed with a  follow-up test. . For someone with known diabetes, a value <7% indicates that their diabetes is well controlled. A1c targets should be individualized based on duration of diabetes, age, comorbid conditions, and other considerations. . This assay result is consistent with an increased risk of diabetes. . Currently, no consensus exists regarding use of hemoglobin A1c for diagnosis of diabetes for children. .    Mean Plasma Glucose 128 mg/dL   eAG (mmol/L) 7.1 mmol/L    Comment: . HbA1c performed on Roche platform. Effective 12/26/21 a change in test platforms may have  shifted HbA1c results compared to historical results.    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  She is advised to continue levothyroxine 175 mcg before breakfast for now.    - We discussed about  the correct intake of her thyroid hormone, on empty stomach at fasting, with water, separated by at least 30 minutes from breakfast and other medications,  and separated by more than 4 hours from calcium, iron, multivitamins, acid reflux medications (PPIs). -Patient is made aware of the fact that thyroid hormone replacement is needed for life, dose to be adjusted by periodic monitoring of thyroid function tests.    2. Hyperlipidemia-  -She has disengaged from lifestyle medicine which led to loss of control of her lipid profile, returning with LDL of 145 increasing from 51.   She still wishes to avoid statin intervention, and reports that she has already restarted her lifestyle medicine journey.  - she acknowledges that there is a room for improvement in her food and drink choices. - Suggestion is made for her to avoid simple carbohydrates  from her diet including Cakes, Sweet Desserts, Ice Cream, Soda (  diet and regular), Sweet Tea, Candies, Chips, Cookies, Store Bought Juices, Alcohol , Artificial Sweeteners,  Coffee Creamer, and "Sugar-free" Products, Lemonade. This will help patient to have more stable blood glucose profile and potentially avoid unintended weight gain.  The following Lifestyle Medicine recommendations according to [place]  [facility]) were discussed and and offered to patient and she  agrees to start the journey:  A. Whole Foods, Plant-Based Nutrition comprising of fruits and vegetables, plant-based proteins, whole-grain carbohydrates was discussed in detail with the patient.   A list for source of those nutrients were also provided to the patient.  Patient will use only water or unsweetened tea for hydration. B.  The need to stay away from risky substances including alcohol, smoking; obtaining 7 to 9 hours of restorative sleep, at least 150 minutes of moderate intensity exercise weekly, the importance of healthy social connections,  and stress management  techniques were discussed. C.  A full color page of  Calorie density of various food groups per pound showing examples of each food groups was provided to the patient.   3) prediabetes-A1c of 6.1%.  She has stopped her metformin.   - I advised patient to maintain close follow up with her PMD for primary care needs.    -She will return in 3 months with previsit labs.   I spent  21  minutes in the care of the patient today including review of labs from Thyroid Function, CMP, and other relevant labs ; imaging/biopsy records (current and previous including abstractions from other facilities); face-to-face time discussing  her lab results and symptoms, medications doses, her options of short and long term treatment based on the latest standards of care / guidelines;   and documenting the encounter.  Gilberto Celentano Gaccione  participated in the discussions, expressed understanding, and voiced agreement with the above plans.  All questions were answered to her satisfaction. she is encouraged to contact clinic should she have any questions or concerns prior to her return visit.    Glade Lloyd, MD Phone: [phone]  Fax: [phone]  -  This note was partially dictated with voice recognition software. Similar sounding words can be transcribed inadequately or may not  be corrected upon review.  06/11/2018, 2:32 PM

## 2022-05-16 ENCOUNTER — Other Ambulatory Visit: Payer: Self-pay | Admitting: Family Medicine

## 2022-05-18 ENCOUNTER — Encounter: Payer: Self-pay | Admitting: Physical Medicine and Rehabilitation

## 2022-05-18 ENCOUNTER — Encounter: Payer: Self-pay | Admitting: Radiology

## 2022-05-29 ENCOUNTER — Ambulatory Visit: Payer: BC Managed Care – PPO | Admitting: Physical Medicine and Rehabilitation

## 2022-05-29 ENCOUNTER — Other Ambulatory Visit: Payer: Self-pay | Admitting: Physical Medicine and Rehabilitation

## 2022-05-29 MED ORDER — OXYCODONE-ACETAMINOPHEN 5-325 MG PO TABS: 1.0000 | ORAL_TABLET | Freq: Two times a day (BID) | ORAL | 0 refills | Status: DC | PRN

## 2022-05-30 ENCOUNTER — Telehealth: Payer: Self-pay | Admitting: Family Medicine

## 2022-05-30 NOTE — Telephone Encounter (Signed)
Patient requesting a prescription for a new CPAP mask and she uses [facility] for her CPAP needs.  CB# [phone]

## 2022-05-31 NOTE — Telephone Encounter (Signed)
Notified patient Rx was sent to [facility] for CPAP mask.

## 2022-06-07 ENCOUNTER — Other Ambulatory Visit: Payer: Self-pay | Admitting: "Endocrinology

## 2022-06-07 DIAGNOSIS — E890 Postprocedural hypothyroidism: Secondary | ICD-10-CM

## 2022-06-30 ENCOUNTER — Encounter: Payer: BC Managed Care – PPO | Admitting: Physical Medicine and Rehabilitation

## 2022-07-03 ENCOUNTER — Encounter
Payer: BC Managed Care – PPO | Attending: Physical Medicine and Rehabilitation | Admitting: Physical Medicine and Rehabilitation

## 2022-07-03 VITALS — BP 159/92 | HR 65 | Ht 67.0 in | Wt 242.0 lb

## 2022-07-03 DIAGNOSIS — M79672 Pain in left foot: Secondary | ICD-10-CM | POA: Diagnosis not present

## 2022-07-03 DIAGNOSIS — M79671 Pain in right foot: Secondary | ICD-10-CM | POA: Diagnosis not present

## 2022-07-03 DIAGNOSIS — Z5181 Encounter for therapeutic drug level monitoring: Secondary | ICD-10-CM | POA: Diagnosis not present

## 2022-07-03 DIAGNOSIS — M48062 Spinal stenosis, lumbar region with neurogenic claudication: Secondary | ICD-10-CM | POA: Insufficient documentation

## 2022-07-03 DIAGNOSIS — Z79891 Long term (current) use of opiate analgesic: Secondary | ICD-10-CM | POA: Diagnosis not present

## 2022-07-03 DIAGNOSIS — G894 Chronic pain syndrome: Secondary | ICD-10-CM | POA: Insufficient documentation

## 2022-07-03 MED ORDER — OXYCODONE-ACETAMINOPHEN 5-325 MG PO TABS: 1.0000 | ORAL_TABLET | Freq: Two times a day (BID) | ORAL | 0 refills | Status: DC | PRN

## 2022-07-03 NOTE — Progress Notes (Signed)
Subjective:    Patient ID: Teresa Vincent, female    DOB: [birth year], 42 y.o.   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  -she felt no benefit from going to the chiropractor.  -pain has been severe recently -twice a month she is working 7 days a week and she talked with her boss's boss about slowing down a little and he is seeing if this possible.  -she takes care of the garden center at [employer] -Pain has been present for years -She has had two back surgeries.  -She has had 7 injections but got no relief from these.  -She has gone back to Dr. Yetta Barre who did her surgery.  -She was on oxycodone 5mg  that she broke in half to 2.5s which helped. This helped her to get through the day. She has been on it for years. She has been following with .  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  -feels better when laying down, but also painful while sitting -today has been a bad day -she uses her electric blanket and heating pad -she has been working at [birth year]-03-1984- she is the cold at 5am in the morning and this is when the pain is worse.  -the colder it is the worse it is hurting -she got a year membership with chiropractor and has been there for a month. She has not found benefit yet.  2) left knee pain: -has had steroid injection with benefit.  -this has continued to benefit.   3) left foot pain -prescribed meloxicam by Dr. [employer] -seeing a doctor today and will told whether she needs surgical pain. She wants whatever is necessary to stop the pain -her arch stays swollen -she was supposed to do PT last month but was unable to get in until 6/6  4) Anxiety -she gets anxious about driving and she gets palpitations -she has  had a prior stroke and has felt this anxiety since this time.   5) migraines -has at baseline but last week was very severe, particularly Wednesday -she considered going to the ED on Wednesday due to her pain but pain improved without new intervention by Thursday -she saw her PCP this morning and was recommended to take Imitrex -she does have a history of remote stroke which was thought to be related to her thyroid disease -she has been doing ok now -her thyroid levels had gone way out of control.  -she received a shot in the ED for her pain  -pain has been stable -she was hopeful that the more she lost weight her pain would improve  6) Loss of cervical lordosis: -working with chiropractor on improving this and has already seen improvements -joinged a gym  7) morbid obesity -she is put on a strict diet with her endocrinologist.  -lost 22 lbs in a month. She went from 262 to 244.2 today.  -husband is doing the same diet with her and he lost 34 lbs.  -they do portion control sizes and they eat meat once per week -they went vegan with a lot of things, cream cheese and cheese -they have a cheat day once per week.   8) Left foot pain -uses a boot to help with  the pain -when she is at work and she gets going its ok, but in the beginning she has a lot of pain -she would like to try ECSWT for this foot as well.   9) Achilles tendinosis, right -she is considering surgery -she is scheduled with me for ECSWT  10) Type 2 diabetes -every now and then she will have a cheat day -she thinks her endocrinologist will be upset with her that her HgbA1c improves -she has lost 36 lbs -she has stopped soft drinks -she shows be her improvements in her lipid panel -discussed that se loves greens, hummus -her last bite is often 5:30-6;:30, sometimes 7pm if she is hungry.   11) Right foot pain: -much better after shockwave therapy  Pain Inventory Average Pain 9 Pain Right Now 6 My pain is  constant, sharp, burning, dull, stabbing, and aching  In the last 24 hours, has pain interfered with the following? General activity 9 Relation with others 9 Enjoyment of life 9 What TIME of day is your pain at its worst? morning , daytime, evening, and night Sleep (in general) Poor  Pain is worse with: walking, bending, sitting, inactivity, standing, and some activites Pain improves with: medication Relief from Meds: 8     Family History  Problem Relation Age of Onset   Hypertension Mother    Thyroid disease Neg Hx    Social History   Socioeconomic History   Marital status: Married    Spouse name: Not on file   Number of children: Not on file   Years of education: Not on file   Highest education level: Not on file  Occupational History   Occupation: Doesn't work outside the home  Tobacco Use   Smoking status: Former    Packs/day: 1.00    Years: 26.00    Additional pack years: 0.00    Total pack years: 26.00    Types: Cigarettes    Quit date: 01/07/2021    Years since quitting: 1.4   Smokeless tobacco: Former  Building services engineer Use: Every day   Substances: Nicotine, Flavoring  Substance and Sexual Activity   Alcohol use: Yes    Alcohol/week: 0.0 standard drinks of alcohol    Comment: occasional   Drug use: Not Currently    Types: Marijuana    Comment: occasional   Sexual activity: Not Currently    Birth control/protection: Surgical  Other Topics Concern   Not on file  Social History Narrative   Not on file   Social Determinants of Health   Financial Resource Strain: Not on file  Food Insecurity: Not on file  Transportation Needs: Not on file  Physical Activity: Not on file  Stress: Not on file  Social Connections: Not on file   Past Surgical History:  Procedure Laterality Date   ABDOMINAL EXPOSURE N/A 05/29/2017   Procedure: ABDOMINAL EXPOSURE;  Surgeon: Chuck Hint, MD;  Location: [facility] OR;  Service: Vascular;  Laterality: N/A;    ABDOMINAL HYSTERECTOMY     ANTERIOR LUMBAR FUSION N/A 05/29/2017   Procedure: Anterior Lumbar Interbody Fusion  - Lumbar five sacral one;  Surgeon: Tia Alert, MD;  Location: [facility] OR;  Service: Neurosurgery;  Laterality: N/A;   BIOPSY  02/17/2020   Procedure: BIOPSY;  Surgeon: Lanelle Bal, DO;  Location: [facility];  Service: Endoscopy;;  gastric   CARPAL TUNNEL RELEASE Bilateral    CHOLECYSTECTOMY     CHOLECYSTECTOMY N/A 12/29/2016   Procedure: LAPAROSCOPIC CHOLECYSTECTOMY;  Surgeon: [name], MD;  Location: [facility] ORS;  Service: General;  Laterality: N/A;   COLONOSCOPY WITH PROPOFOL N/A 02/17/2020   non-bleeding internal hemorrhoids, otherwise normal.   CYSTOSCOPY/URETEROSCOPY/HOLMIUM LASER/STENT PLACEMENT Right 06/25/2019   Procedure: CYSTOSCOPY WITH RIGHT RETROGRADE PYELOGRAM/RIGHT URETEROSCOPY/HOLMIUM LASER APPLICATION RIGHT URETERAL CALCULUS/RIGHT URETERAL STENT PLACEMENT;  Surgeon: Malen Gauze, MD;  Location: [facility];  Service: Urology;  Laterality: Right;   ESOPHAGOGASTRODUODENOSCOPY (EGD) WITH PROPOFOL N/A 02/17/2020   Procedure: ESOPHAGOGASTRODUODENOSCOPY (EGD) WITH PROPOFOL;  Surgeon: Lanelle Bal, DO;  Location: [facility];  Service: Endoscopy;  Laterality: N/A;   EXTRACORPOREAL SHOCK WAVE LITHOTRIPSY Right 04/10/2019   Procedure: EXTRACORPOREAL SHOCK WAVE LITHOTRIPSY (ESWL);  Surgeon: Noel Christmas, MD;  Location: [facility];  Service: Urology;  Laterality: Right;   LAMINECTOMY WITH POSTERIOR LATERAL ARTHRODESIS LEVEL 1 N/A 02/28/2018   Procedure: Laminectomy and Foraminotomy Lumbar Five-Sacral One, posterolateral fusion and fixation;  Surgeon: Tia Alert, MD;  Location[facility] OR;  Service: Neurosurgery;  Laterality: N/A;  posterior   LUMBAR EPIDURAL INJECTION     STERIOD INJECTION N/A 01/10/2017   Procedure: MINOR EXPAREL INJECTION;  Surgeon: [name], MD;  Location: [facility];  Service: General;  Laterality: N/A;   TOTAL  ABDOMINAL HYSTERECTOMY W/ BILATERAL SALPINGOOPHORECTOMY N/A    TUBAL LIGATION  2003   Past Medical History:  Diagnosis Date   Abnormal Pap smear of cervix    ANXIETY 08/30/2007   Asthma    Chronic back pain    Claustrophobia    pt requests Versed before surgery.   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   LMP 02/25/2016   Opioid Risk Score:   Fall Risk Score:  `1  Depression screen PHQ 2/9     04/07/2022    1:39 PM 03/21/2022    2:19 PM 03/14/2022   10:20 AM 02/28/2022    1:59 PM 02/24/2022   10:53 AM 01/30/2022    2:27 PM 12/13/2021    9:37 AM  Depression screen PHQ 2/9  Decreased Interest 0 0 0 0 0 0 0  Down, Depressed, Hopeless 0 0 0 0 0 0 0  PHQ - 2 Score 0 0 0 0 0 0 0  Altered sleeping 2        Tired, decreased energy 0        Change in appetite 0        Feeling bad or failure about yourself  0        Trouble concentrating 0        Moving slowly or fidgety/restless 0        Suicidal thoughts 0        PHQ-9 Score 2        Difficult doing work/chores Somewhat difficult          Review of Systems  Constitutional:  Positive for unexpected weight change.  HENT: Negative.    Eyes: Negative.   Respiratory: Negative.    Cardiovascular:  Positive for leg swelling.  Gastrointestinal:  Negative.   Endocrine: Negative.   Genitourinary: Negative.   Musculoskeletal:  Positive for arthralgias and back pain.  Skin: Negative.   Allergic/Immunologic: Negative.   Neurological:  Positive for numbness.       Tingling  Hematological: Negative.   Psychiatric/Behavioral:  The patient is nervous/anxious.   All  other systems reviewed and are negative.      Objective:   Physical Exam Gen: no distress, normal appearing, weight 230 lbs, BMI 36.15, 117/80 HEENT: oral mucosa pink and moist, NCAT, exopthalmos Cardio: Reg rate Chest: normal effort, normal rate of breathing Abd: soft, non-distended Ext: no edema Psych: pleasant, normal affect Skin: intact Neuro: Alert and oriented x3.  Musculoskeletal: Normal ambulation, right achiles tendinopathy, left heel spur    Assessment & Plan:  Teresa Vincent is a 42 year old woman who presents to establish care for lumbar spinal stenosis.  1) Lumbar spinal stenosis with lumbar neurogenic claudication.  -Discussed benefits of exercise in reducing pain. -Refilled oxycodone -discussed negative response to gabapentin in the past.  Prescribed Zynex Nexwave and heating/cooling blanket  UDS today -Discussed current symptoms -She is allergic to bupivicaine, she has tried surgery, injections, medication. Oxycodone 5mg  helped. She has never failed a drug screen. She works at [employer]. Increase oxycodone to 5mg  daily.  -Will obtain UDS and pain contract today. If results negative, can prescribe Percocet 5mg -325 monthly PRN.  Lumbar MRI reviewed as follows:  IMPRESSION: 1. No impingement identified in the lumbar spine. 2. 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 3. Degenerative disc disease at T11-12, L2- 3, L3-4, and L5-S1. 4. Cholelithiasis.  -Discussed current symptoms of pain and history of pain.  -Discussed benefits of exercise in reducing pain. -Discussed following foods that may reduce pain: 1) Ginger (especially studied for arthritis)- reduce leukotriene production to decrease inflammation 2) Blueberries- high in phytonutrients that decrease inflammation 3) Salmon- marine omega-3s reduce joint swelling and pain 4) Pumpkin seeds- reduce inflammation 5) dark chocolate- reduces inflammation 6) turmeric- reduces  inflammation 7) tart cherries - reduce pain and stiffness 8) extra virgin olive oil - its compound olecanthal helps to block prostaglandins  9) chili peppers- can be eaten or applied topically via capsaicin 10) mint- helpful for headache, muscle aches, joint pain, and itching 11) garlic- reduces inflammation  Link to further information on diet for chronic pain: [email or web address]    2) Obesity: weight 242 lbs, BMI 37.90, commended on weight loss! -Educated regarding health benefits of weight loss- for pain, general health, chronic disease prevention, immune health, mental health.  -continue black coffee -Educated regarding health benefits of weight loss- for pain, general health, chronic disease prevention, immune health, mental health.  -Will monitor weight every visit.  -Consider Roobois tea daily.  -Discussed the benefits of intermittent fasting. -Discussed foods that can assist in weight loss: 1) leafy greens- high in fiber and nutrients 2) dark chocolate- improves metabolism (if prefer sweetened, best to sweeten with honey instead of sugar).  3) cruciferous vegetables- high in fiber and protein 4) full fat yogurt: high in healthy fat, protein, calcium, and probiotics 5) apples- high in a variety of phytochemicals 6) nuts- high in fiber and protein that increase feelings of fullness 7) grapefruit: rich in nutrients, antioxidants, and fiber (not to be taken with anticoagulation) 8) beans- high in protein and fiber 9) salmon- has high quality protein and healthy fats 10) green tea- rich in polyphenols 11) eggs- rich in choline and vitamin D 12) tuna- high  protein, boosts metabolism 13) avocado- decreases visceral abdominal fat 14) chicken (pasture raised): high in protein and iron 15) blueberries- reduce abdominal fat and cholesterol 16) whole grains- decreases calories retained during digestion, speeds  metabolism 17) chia seeds- curb appetite 18) chilies- increases fat metabolism  -Discussed supplements that can be used:  1) Metatrim  BID 30 minutes before breakfast and dinner  2) Sphaeranthus indicus and Garcinia mangostana (combinations of these and #1 can be found in capsicum and zychrome  3) green coffee bean extract  twice per day or Irvingia (african mango) 150 to  twice per day.   3) migraines -agree with PCP that imitrex is a great option. Discussed with her that there is increased risk of stroke. She states thyroid function is currently well controlled -discussed any potential triggers- she notes new perfume last week which could have been a trigger -advised to continue to be attentive for other food or chemical related triggers   4) HTN: -BP is 117/80 -Advised checking BP daily at home and logging results to bring into follow-up appointment with PCP and myself. -Reviewed BP meds today.  -Advised regarding healthy foods that can help lower blood pressure and provided with a list: 1) citrus foods- high in vitamins and minerals 2) salmon and other fatty fish - reduces inflammation and oxylipins 3) swiss chard (leafy green)- high level of nitrates 4) pumpkin seeds- one of the best natural sources of magnesium 5) Beans and lentils- high in fiber, magnesium, and potassium 6) Berries- high in flavonoids 7) Amaranth (whole grain, can be cooked similarly to rice and oats)- high in magnesium and fiber 8) Pistachios- even more effective at reducing BP than other nuts 9) Carrots- high in phenolic compounds that relax blood vessels and reduce inflammation 10) Celery- contain phthalides that relax tissues of arterial walls 11) Tomatoes- can also improve cholesterol and reduce risk of heart disease 12) Broccoli- good source of magnesium, calcium, and potassium 13) Greek yogurt: high in potassium and calcium 14) Herbs and spices: Celery seed, cilantro, saffron, lemongrass,  black cumin, ginseng, cinnamon, cardamom, sweet basil, and ginger 15) Chia and flax seeds- also help to lower cholesterol and blood sugar 16) Beets- high levels of nitrates that relax blood vessels  17) spinach and bananas- high in potassium  -Provided lise of supplements that can help with hypertension:  1) magnesium: one high quality brand is Bioptemizers since it contains all 7 types of magnesium, otherwise over the counter magnesium gluconate  is a good option 2) B vitamins 3) vitamin D 4) potassium 5) CoQ10 6) L-arginine 7) Vitamin C 8) Beetroot -Educated that goal BP is 120/80. -Made goal to incorporate some of the above foods into diet.    5) Left heel plantar fascitis: -schedule for extracorporeal shockwave therapy once per week for 3 weeks.   6) Right Achiles tendinopathy: -consider extracorporeal shockwave therapy in the future  7) Type 2 diabetes: -Discussed the benefits of intermittent fasting. Recommended starting with pushing dinner 15 minutes earlier and when this feels easy, continuing to push dinner 15 minutes earlier. Discussed that this can help her body to improve its ability to burn fat rather than glucose, improving insulin sensitivity. Recommended drinking Roobois tea in the evening to help curb appetite and for its numerous health benefits.   -discussed most recent HgbA1c of 6.2. -discussed that she is doing an incredible job with her intermittent fasting -commended eating smaller dinners.  -continue steamed vegetables -continue protein shake -discussed the importance of fiber.

## 2022-07-07 ENCOUNTER — Encounter: Payer: BC Managed Care – PPO | Admitting: Physical Medicine and Rehabilitation

## 2022-07-07 LAB — TOXASSURE SELECT,+ANTIDEPR,UR

## 2022-07-11 DIAGNOSIS — M4807 Spinal stenosis, lumbosacral region: Secondary | ICD-10-CM | POA: Diagnosis not present

## 2022-07-12 ENCOUNTER — Telehealth: Payer: Self-pay | Admitting: *Deleted

## 2022-07-12 DIAGNOSIS — M4807 Spinal stenosis, lumbosacral region: Secondary | ICD-10-CM | POA: Diagnosis not present

## 2022-07-12 NOTE — Telephone Encounter (Signed)
Urine drug screen for this encounter is consistent for NO prescribed medication.She was out of medication.

## 2022-07-13 DIAGNOSIS — M4807 Spinal stenosis, lumbosacral region: Secondary | ICD-10-CM | POA: Diagnosis not present

## 2022-07-14 ENCOUNTER — Ambulatory Visit: Payer: BC Managed Care – PPO | Admitting: Physical Medicine and Rehabilitation

## 2022-08-10 DIAGNOSIS — E782 Mixed hyperlipidemia: Secondary | ICD-10-CM | POA: Diagnosis not present

## 2022-08-10 DIAGNOSIS — M4807 Spinal stenosis, lumbosacral region: Secondary | ICD-10-CM | POA: Diagnosis not present

## 2022-08-10 DIAGNOSIS — E890 Postprocedural hypothyroidism: Secondary | ICD-10-CM | POA: Diagnosis not present

## 2022-08-11 LAB — LIPID PANEL
Cholesterol: 229 mg/dL — ABNORMAL HIGH (ref <200)
HDL: 55 mg/dL (ref >=50)
LDL Cholesterol (Calc): 144 mg/dL (calc) — ABNORMAL HIGH
Non-HDL Cholesterol (Calc): 174 mg/dL (calc) — ABNORMAL HIGH (ref <130)
Total CHOL/HDL Ratio: 4.2 (calc) (ref <5.0)
Triglycerides: 163 mg/dL — ABNORMAL HIGH (ref <150)

## 2022-08-11 LAB — T4, FREE: Free T4: 1.2 ng/dL (ref 0.8–1.8)

## 2022-08-11 LAB — TSH: TSH: 10.76 mIU/L — ABNORMAL HIGH

## 2022-08-12 ENCOUNTER — Other Ambulatory Visit: Payer: Self-pay | Admitting: Physical Medicine and Rehabilitation

## 2022-08-15 MED ORDER — OXYCODONE-ACETAMINOPHEN 5-325 MG PO TABS: 1.0000 | ORAL_TABLET | Freq: Two times a day (BID) | ORAL | 0 refills | Status: DC | PRN

## 2022-08-17 ENCOUNTER — Encounter: Payer: Self-pay | Admitting: "Endocrinology

## 2022-08-17 ENCOUNTER — Ambulatory Visit: Payer: BC Managed Care – PPO | Admitting: "Endocrinology

## 2022-08-17 VITALS — BP 136/78 | HR 76 | Ht 67.0 in | Wt 247.6 lb

## 2022-08-17 DIAGNOSIS — Z6838 Body mass index (BMI) 38.0-38.9, adult: Secondary | ICD-10-CM

## 2022-08-17 DIAGNOSIS — R7303 Prediabetes: Secondary | ICD-10-CM | POA: Diagnosis not present

## 2022-08-17 DIAGNOSIS — E890 Postprocedural hypothyroidism: Secondary | ICD-10-CM

## 2022-08-17 DIAGNOSIS — E782 Mixed hyperlipidemia: Secondary | ICD-10-CM

## 2022-08-17 MED ORDER — LEVOTHYROXINE SODIUM 200 MCG PO TABS: 200.0000 ug | ORAL_TABLET | Freq: Every day | ORAL | 0 refills | Status: DC

## 2022-08-17 MED ORDER — ROSUVASTATIN CALCIUM 10 MG PO TABS
10.0000 mg | ORAL_TABLET | Freq: Every day | ORAL | 1 refills | Status: DC
Start: 2022-08-17 — End: 2023-03-09

## 2022-08-17 NOTE — Progress Notes (Signed)
08/17/2022    Endocrinology follow-up note   Subjective:    Patient ID: Teresa Vincent, female    DOB: 09/23/1980, PCP [name], MD  Past Medical History:  Diagnosis Date   Abnormal Pap smear of cervix    ANXIETY 08/30/2007   Asthma    Chronic back pain    Claustrophobia    pt requests Versed before surgery.   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     Past Surgical History:  Procedure Laterality Date   ABDOMINAL EXPOSURE N/A 05/29/2017   Procedure: ABDOMINAL EXPOSURE;  Surgeon: Chuck Hint, MD;  Location: [facility] OR;  Service: Vascular;  Laterality: N/A;   ABDOMINAL HYSTERECTOMY     ANTERIOR LUMBAR FUSION N/A 05/29/2017   Procedure: Anterior Lumbar Interbody Fusion  - Lumbar five sacral one;  Surgeon: Tia Alert, MD;  Location: [facility] OR;  Service: Neurosurgery;  Laterality: N/A;   BIOPSY  02/17/2020   Procedure: BIOPSY;  Surgeon: Lanelle Bal, DO;  Location: [facility];  Service: Endoscopy;;  gastric   CARPAL TUNNEL RELEASE Bilateral    CHOLECYSTECTOMY     CHOLECYSTECTOMY N/A 12/29/2016   Procedure: LAPAROSCOPIC CHOLECYSTECTOMY;  Surgeon: [name], MD;  Location: [facility] OR[facility];  Service: General;  Laterality: N/A;   COLONOSCOPY WITH PROPOFOL N/A 02/17/2020   non-bleeding internal hemorrhoids, otherwise normal.   CYSTOSCOPY/URETEROSCOPY/HOLMIUM LASER/STENT PLACEMENT Right 06/25/2019   Procedure: CYSTOSCOPY WITH RIGHT RETROGRADE PYELOGRAM/RIGHT URETEROSCOPY/HOLMIUM LASER APPLICATION RIGHT URETERAL CALCULUS/RIGHT URETERAL STENT PLACEMENT;  Surgeon: Malen Gauze, MD;   Location: [facility];  Service: Urology;  Laterality: Right;   ESOPHAGOGASTRODUODENOSCOPY (EGD) WITH PROPOFOL N/A 02/17/2020   Procedure: ESOPHAGOGASTRODUODENOSCOPY (EGD) WITH PROPOFOL;  Surgeon: Lanelle Bal, DO;  Location: [facility];  Service: Endoscopy;  Laterality: N/A;   EXTRACORPOREAL SHOCK WAVE LITHOTRIPSY Right 04/10/2019   Procedure: EXTRACORPOREAL SHOCK WAVE LITHOTRIPSY (ESWL);  Surgeon: Noel Christmas, MD;  Location: [facility];  Service: Urology;  Laterality: Right;   LAMINECTOMY WITH POSTERIOR LATERAL ARTHRODESIS LEVEL 1 N/A 02/28/2018   Procedure: Laminectomy and Foraminotomy Lumbar Five-Sacral One, posterolateral fusion and fixation;  Surgeon: Tia Alert, MD;  Location: [facility] OR;  Service: Neurosurgery;  Laterality: N/A;  posterior   LUMBAR EPIDURAL INJECTION     STERIOD INJECTION N/A 01/10/2017   Procedure: MINOR EXPAREL INJECTION;  Surgeon: [name], MD;  Location: [facility];  Service: General;  Laterality: N/A;   TOTAL ABDOMINAL HYSTERECTOMY W/ BILATERAL SALPINGOOPHORECTOMY N/A    TUBAL LIGATION  2003   Social History   Social History   Socioeconomic History   Marital status: Married    Spouse name: Not on file   Number of children: Not on file   Years of education: Not on file   Highest education level: Not on file  Occupational History   Occupation: Doesn't work outside the home  Tobacco Use   Smoking status: Former    Packs/day: 1.00    Years: 26.00    Additional pack years: 0.00  Total pack years: 26.00    Types: Cigarettes    Quit date: 01/07/2021    Years since quitting: 1.6   Smokeless tobacco: Former  Building services engineer Use: Every day   Substances: Nicotine, Flavoring  Substance and Sexual Activity   Alcohol use: Yes    Alcohol/week: 0.0 standard drinks of alcohol    Comment: occasional   Drug use: Not Currently    Types: Marijuana    Comment: occasional   Sexual activity: Not Currently    Birth  control/protection: Surgical  Other Topics Concern   Not on file  Social History Narrative   Not on file   Social Determinants of Health   Financial Resource Strain: Not on file  Food Insecurity: Not on file  Transportation Needs: Not on file  Physical Activity: Not on file  Stress: Not on file  Social Connections: Not on file  Intimate Partner Violence: Not on file     Current Outpatient Medications  Medication Instructions   albuterol (VENTOLIN HFA) 108 (90 Base) MCG/ACT inhaler 1-2 puffs, Inhalation, Every 4 hours PRN   ALPRAZolam (XANAX) 0.25-0.5 mg, Oral, 2 times daily PRN   cetirizine (ZYRTEC) 10 mg, Oral, Daily   Cholecalciferol 50 MCG (2000 UT) CAPS 1 capsule, Oral, Daily   levothyroxine (SYNTHROID) 200 mcg, Oral, Daily   metoprolol succinate (TOPROL-XL) 50 MG 24 hr tablet TAKE 1 TABLET BY MOUTH ONCE DAILY WITH OR IMMEDIATELY FOLLOWING A MEAL   omeprazole (PRILOSEC) 40 MG capsule TAKE 1 CAPSULE BY MOUTH TWICE DAILY BEFORE A MEAL   ondansetron (ZOFRAN-ODT) 4 MG disintegrating tablet DISSOLVE 1 TABLET IN MOUTH EVERY 8 HOURS AS NEEDED   oxyCODONE-acetaminophen (PERCOCET) 5-325 MG tablet 1 tablet, Oral, 2 times daily PRN   rosuvastatin (CRESTOR) 10 mg, Oral, Daily   SUMAtriptan (IMITREX) 100 MG tablet 1 tablet at the first sign of migraine. Additional dose in 2 hours if headache persists or recurs.     HPI  Teresa Vincent is a 42 year old female patient with medical history as above.  She is being seen in follow-up for hypothyroidism related to remote past thyroid ablation with I-131 for Graves' disease.  Hyperlipidemia, prediabetes, hypertension. 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 She was treated with various doses of levothyroxine over the years .  She is currently on levothyroxine 175 mcg p.o. daily before breakfast. She reports good tolerance and consistency taking her medications.  Her  previsit labs are consistent with under replacement.  She does have severe dyslipidemia not on treatment due to her declining of statin intervention previously.  She has disengaged from self-care.  She is more open for statin intervention at this time. She was  recently  diagnosed with CVA for which she was initiated on Plavix.   She has prediabetes with A1c of 6.1%.   She did not stay on metformin.     Objective:     HEENT: She has prominent eyes, however no significant lid lag.  Recent Results (from the past 2160 hour(s))  ToxAssure Select Plus     Status: None   Collection Time: 07/03/22  3:31 PM  Result Value Ref Range   Summary Note     Comment: ==================================================================== ToxAssure Select,+Antidepr,UR ==================================================================== Test                             Result  Flag       Units    NO DRUGS DETECTED. ==================================================================== Test                      Result    Flag   Units      Ref Range   Creatinine              123              mg/dL      >=16 ==================================================================== Declared Medications:  Medication list was not provided. ==================================================================== For clinical consultation, please call [phone]. ====================================================================   Lipid panel     Status: Abnormal   Collection Time: 08/10/22  7:35 AM  Result Value Ref Range   Cholesterol 229 (H) <200 mg/dL   HDL 55 > OR = 50 mg/dL   Triglycerides 811 (H) <150 mg/dL   LDL Cholesterol (Calc) 144 (H) mg/dL (calc)    Comment: Reference range: <100 . Desirable range <100 mg/dL for primary prevention;   <70 mg/dL for patients with CHD or diabetic patients  with > or = 2 CHD risk factors. Marland Kitchen LDL-C is now calculated using the Martin-Hopkins  calculation,  which is a validated novel method providing  better accuracy than the Friedewald equation in the  estimation of LDL-C.  Horald Pollen et al. Lenox Ahr. 9147;829(56): 2061-2068  (http://education.[facility].com/faq/FAQ164)    Total CHOL/HDL Ratio 4.2 <5.0 (calc)   Non-HDL Cholesterol (Calc) 174 (H) <130 mg/dL (calc)    Comment: For patients with diabetes plus 1 major ASCVD risk  factor, treating to a non-HDL-C goal of <100 mg/dL  (LDL-C of <21 mg/dL) is considered a therapeutic  option.   TSH     Status: Abnormal   Collection Time: 08/10/22  7:35 AM  Result Value Ref Range   TSH 10.76 (H) mIU/L    Comment:           Reference Range .           > or = 20 Years  0.40-4.50 .                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   She will benefit from slight increase in her levothyroxine.  I discussed and prescribed levothyroxine 200 mcg p.o. daily before breakfast.  - We discussed about the correct intake of her thyroid hormone, on empty stomach at fasting, with water, separated by at least 30 minutes from breakfast and other medications,  and separated by more than 4 hours from calcium, iron, multivitamins, acid reflux medications (PPIs). -Patient is made aware of the fact that thyroid hormone replacement is needed for life, dose to be adjusted by periodic monitoring of thyroid function tests.   2. Hyperlipidemia-  -She has disengaged from lifestyle medicine which led to loss of control of her lipid profile, returning with LDL of 144 mg per DL.  She is urged  to resume her lifestyle interventions that worked for her in the past.  She is also urged  to start Crestor 10 mg p.o. nightly.  Side effects and precautions discussed with her.    In light of her metabolic dysfunction indicated by prediabetes, hypothermia, obesity she remains to benefit from lifestyle meds.  - she acknowledges that there is a room for improvement in her food and drink choices. - Suggestion is made for her to avoid simple carbohydrates  from her diet including Cakes, Sweet Desserts, Ice Cream, Soda (diet and regular), Sweet Tea, Candies, Chips, Cookies, Store Bought Juices, Alcohol , Artificial Sweeteners,  Coffee Creamer, and "Sugar-free" Products, Lemonade. This will help patient to have more stable blood glucose profile and potentially avoid unintended weight gain.  The following Lifestyle Medicine recommendations according to American College of Lifestyle Medicine  [facility]) were discussed and and offered to patient and she  agrees to start the journey:  A. Whole Foods, Plant-Based Nutrition comprising of fruits and vegetables, plant-based proteins, whole-grain carbohydrates was discussed in detail with the patient.   A list for source of those nutrients were also provided to the patient.  Patient will use only water or unsweetened tea for hydration. B.  The need to stay away from risky substances including alcohol, smoking; obtaining 7 to 9 hours of restorative sleep, at least 150 minutes of moderate intensity exercise weekly, the importance of healthy social connections,  and stress management techniques were discussed. C.  A full color page of  Calorie density of various food groups per pound showing examples of each food groups was provided to the patient.   3) prediabetes-A1c of 6.1%.  She has stopped her metformin.  She will have point-of-care A1c during her next visit.  - I advised patient to maintain close follow up with her PMD for primary care needs.    I spent  25  minutes in the care of the patient today including review of labs from Thyroid Function, CMP,  and other relevant labs ; imaging/biopsy records (current and previous including abstractions from other facilities); face-to-face time discussing  her lab results and symptoms, medications doses, her options of short and long term treatment based on the latest standards of care / guidelines;   and documenting the encounter.  Leonard President Tarrant  participated in the discussions, expressed understanding, and voiced agreement with the above plans.  All questions were answered to her satisfaction. she is encouraged to contact clinic should she have any questions or concerns prior to her return visit.   Marquis Lunch, MD Phone: [phone]  Fax: [phone]  -  This note was partially dictated with voice recognition software. Similar sounding words can be transcribed inadequately or may not  be corrected upon review.  06/11/2018, 2:32 PM

## 2022-08-21 ENCOUNTER — Ambulatory Visit: Payer: BC Managed Care – PPO | Admitting: Physical Medicine and Rehabilitation

## 2022-08-25 ENCOUNTER — Other Ambulatory Visit: Payer: Self-pay | Admitting: Family Medicine

## 2022-09-10 DIAGNOSIS — M4807 Spinal stenosis, lumbosacral region: Secondary | ICD-10-CM | POA: Diagnosis not present

## 2022-09-11 ENCOUNTER — Other Ambulatory Visit: Payer: Self-pay | Admitting: Family Medicine

## 2022-09-19 ENCOUNTER — Other Ambulatory Visit: Payer: Self-pay | Admitting: Physical Medicine and Rehabilitation

## 2022-09-19 MED ORDER — OXYCODONE-ACETAMINOPHEN 5-325 MG PO TABS: 1.0000 | ORAL_TABLET | Freq: Two times a day (BID) | ORAL | 0 refills | Status: DC | PRN

## 2022-10-02 ENCOUNTER — Encounter
Payer: BC Managed Care – PPO | Attending: Physical Medicine and Rehabilitation | Admitting: Physical Medicine and Rehabilitation

## 2022-10-02 ENCOUNTER — Other Ambulatory Visit: Payer: Self-pay | Admitting: Family Medicine

## 2022-10-02 ENCOUNTER — Encounter: Payer: Self-pay | Admitting: Physical Medicine and Rehabilitation

## 2022-10-02 VITALS — BP 145/8 | HR 74 | Ht 67.0 in | Wt 256.0 lb

## 2022-10-02 DIAGNOSIS — G894 Chronic pain syndrome: Secondary | ICD-10-CM | POA: Diagnosis not present

## 2022-10-02 DIAGNOSIS — M48062 Spinal stenosis, lumbar region with neurogenic claudication: Secondary | ICD-10-CM | POA: Insufficient documentation

## 2022-10-02 DIAGNOSIS — M722 Plantar fascial fibromatosis: Secondary | ICD-10-CM | POA: Insufficient documentation

## 2022-10-02 DIAGNOSIS — M79672 Pain in left foot: Secondary | ICD-10-CM | POA: Insufficient documentation

## 2022-10-02 DIAGNOSIS — M79671 Pain in right foot: Secondary | ICD-10-CM | POA: Insufficient documentation

## 2022-10-02 MED ORDER — ONDANSETRON 4 MG PO TBDP: 4.0000 mg | ORAL_TABLET | Freq: Three times a day (TID) | ORAL | 0 refills | Status: DC | PRN

## 2022-10-02 MED ORDER — OXYCODONE-ACETAMINOPHEN 5-325 MG PO TABS: 1.0000 | ORAL_TABLET | Freq: Two times a day (BID) | ORAL | 0 refills | Status: DC | PRN

## 2022-10-02 NOTE — Progress Notes (Signed)
Subjective:    Patient ID: Teresa Vincent, female    DOB: [birth year], 42 y.o.   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  -she felt no benefit from going to the chiropractor.  -pain has been severe recently -twice a month she is working 7 days a week and she talked with her boss's boss about slowing down a little and he is seeing if this possible.  -she takes care of the garden center at [employer] -Pain has been present for years -She has had two back surgeries.  -She has had 7 injections but got no relief from these.  -She has gone back to Dr. Yetta Barre who did her surgery.  -She was on oxycodone 5mg  that she broke in half to 2.5s which helped. This helped her to get through the day. She has been on it for years. She has been following with Riley Lam.  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  -feels better when laying down, but also painful while sitting -today has been a bad day -she uses her electric blanket and heating pad -she has been working at [employer]- she is the cold at 5am in the morning and this is when the pain is worse.  -the colder it is the worse it is hurting -she got a year membership with chiropractor and has been there for a month. She has not found benefit yet.  2) left knee pain: -has had steroid injection with benefit.  -this has continued to benefit.   3) left foot pain -prescribed meloxicam by Dr. Adriana Simas -seeing a doctor today and will told whether she needs surgical pain. She wants whatever is necessary to stop the pain -her arch stays swollen -she was  supposed to do PT last month but was unable to get in until 6/6 -still present  4) Anxiety -she gets anxious about driving and she gets palpitations -she has had a prior stroke and has felt this anxiety since this time.   5) migraines -has at baseline but last week was very severe, particularly Wednesday -she considered going to the ED on Wednesday due to her pain but pain improved without new intervention by Thursday -she saw her PCP this morning and was recommended to take Imitrex -she does have a history of remote stroke which was thought to be related to her thyroid disease -she has been doing ok now -her thyroid levels had gone way out of control.  -she received a shot in the ED for her pain  -pain has been stable -she was hopeful that the more she lost weight her pain would improve  6) Loss of cervical lordosis: -working with chiropractor on improving this and has already seen improvements -joinged a gym  7) morbid obesity -she is put on a strict diet with her endocrinologist.  -lost 22 lbs in a month. She went from 262 to 244.2 today.  -husband is doing the same diet with her and he lost 34 lbs.  -they do portion control sizes and they eat meat once per  week -they went vegan with a lot of things, cream cheese and cheese -they have a cheat day once per week.   8) Left foot pain -uses a boot to help with the pain -when she is at work and she gets going its ok, but in the beginning she has a lot of pain -she would like to try ECSWT for this foot as well.   9) Achilles tendinosis, right -she is considering surgery -she is scheduled with me for ECSWT  10) Type 2 diabetes -every now and then she will have a cheat day -she thinks her endocrinologist will be upset with her that her HgbA1c improves -she has lost 36 lbs -she has stopped soft drinks -she shows be her improvements in her lipid panel -discussed that se loves greens, hummus -her last bite is often 5:30-6;:30,  sometimes 7pm if she is hungry.   11) Right foot pain: -much better after shockwave therapy -continues to have improvement  Pain Inventory Average Pain 9 Pain Right Now 7 My pain is constant, sharp, burning, dull, stabbing, and aching  In the last 24 hours, has pain interfered with the following? General activity 5 Relation with others 5 Enjoyment of life 7 What TIME of day is your pain at its worst? morning , daytime, evening, and night Sleep (in general) Fair Pain is worse with: walking, bending, sitting, inactivity, standing, and some activites Pain improves with: medication  Relief from Meds: 4     Family History  Problem Relation Age of Onset   Hypertension Mother    Thyroid disease Neg Hx    Social History   Socioeconomic History   Marital status: Married    Spouse name: Not on file   Number of children: Not on file   Years of education: Not on file   Highest education level: Not on file  Occupational History   Occupation: Doesn't work outside the home  Tobacco Use   Smoking status: Former    Current packs/day: 0.00    Average packs/day: 1 pack/day for 26.0 years (26.0 ttl pk-yrs)    Types: Cigarettes    Start date: 01/08/1995    Quit date: 01/07/2021    Years since quitting: 1.7   Smokeless tobacco: Former  Building services engineer status: Every Day   Substances: Nicotine, Flavoring  Substance and Sexual Activity   Alcohol use: Yes    Alcohol/week: 0.0 standard drinks of alcohol    Comment: occasional   Drug use: Not Currently    Types: Marijuana    Comment: occasional   Sexual activity: Not Currently    Birth control/protection: Surgical  Other Topics Concern   Not on file  Social History Narrative   Not on file   Social Determinants of Health   Financial Resource Strain: Not on file  Food Insecurity: Not on file  Transportation Needs: Not on file  Physical Activity: Not on file  Stress: Not on file  Social Connections: Not on file   Past  Surgical History:  Procedure Laterality Date   ABDOMINAL EXPOSURE N/A 05/29/2017   Procedure: ABDOMINAL EXPOSURE;  Surgeon: Chuck Hint, MD;  [facility] OR;  Service: Vascular;  Laterality: N/A;   ABDOMINAL HYSTERECTOMY     ANTERIOR LUMBAR FUSION N/A 05/29/2017   Procedure: Anterior Lumbar Interbody Fusion  - Lumbar five sacral one;  Surgeon: Tia Alert, MD;  Location: [facility] OR;  Service: Neurosurgery;  Laterality: N/A;   BIOPSY  02/17/2020   Procedure: BIOPSY;  Surgeon: Lanelle Bal, DO;  Location: [facility];  Service: Endoscopy;;  gastric   CARPAL TUNNEL RELEASE Bilateral    CHOLECYSTECTOMY     CHOLECYSTECTOMY N/A 12/29/2016   Procedure: LAPAROSCOPIC CHOLECYSTECTOMY;  Surgeon: [name], MD;  Location: [facility] ORS;  Service: General;  Laterality: N/A;   COLONOSCOPY WITH PROPOFOL N/A 02/17/2020   non-bleeding internal hemorrhoids, otherwise normal.   CYSTOSCOPY/URETEROSCOPY/HOLMIUM LASER/STENT PLACEMENT Right 06/25/2019   Procedure: CYSTOSCOPY WITH RIGHT RETROGRADE PYELOGRAM/RIGHT URETEROSCOPY/HOLMIUM LASER APPLICATION RIGHT URETERAL CALCULUS/RIGHT URETERAL STENT PLACEMENT;  Surgeon: Malen Gauze, MD;  Location: [facility];  Service: Urology;  Laterality: Right;   ESOPHAGOGASTRODUODENOSCOPY (EGD) WITH PROPOFOL N/A 02/17/2020   Procedure: ESOPHAGOGASTRODUODENOSCOPY (EGD) WITH PROPOFOL;  Surgeon: Lanelle Bal, DO;  Location: [facility];  Service: Endoscopy;  Laterality: N/A;   EXTRACORPOREAL SHOCK WAVE LITHOTRIPSY Right 04/10/2019   Procedure: EXTRACORPOREAL SHOCK WAVE LITHOTRIPSY (ESWL);  Surgeon: Noel Christmas, MD;  Location: [facility];  Service: Urology;  Laterality: Right;   LAMINECTOMY WITH POSTERIOR LATERAL ARTHRODESIS LEVEL 1 N/A 02/28/2018   Procedure: Laminectomy and Foraminotomy Lumbar Five-Sacral One, posterolateral fusion and fixation;  Surgeon: Tia Alert, MD;  Location: [facility] OR;  Service: Neurosurgery;  Laterality: N/A;   posterior   LUMBAR EPIDURAL INJECTION     STERIOD INJECTION N/A 01/10/2017   Procedure: MINOR EXPAREL INJECTION;  Surgeon: [name], MD;  Location: [facility];  Service: General;  Laterality: N/A;   TOTAL ABDOMINAL HYSTERECTOMY W/ BILATERAL SALPINGOOPHORECTOMY N/A    TUBAL LIGATION  2003   Past Medical History:  Diagnosis Date   Abnormal Pap smear of cervix    ANXIETY 08/30/2007   Asthma    Chronic back pain    Claustrophobia    pt requests Versed before surgery.   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   BP (!) 145/8   Pulse 74   Ht 5\' 7"  (1.702 m)   Wt 256 lb (116.1 kg)   LMP 02/25/2016   SpO2 97%   BMI 40.10 kg/m   Opioid Risk Score:   Fall Risk Score:  `1  Depression screen PHQ 2/9     04/07/2022    1:39 PM 03/21/2022    2:19 PM 03/14/2022   10:20 AM 02/28/2022    1:59 PM 02/24/2022   10:53 AM 01/30/2022    2:27 PM 12/13/2021    9:37 AM  Depression screen PHQ 2/9  Decreased Interest 0 0 0 0 0 0 0  Down, Depressed, Hopeless 0 0 0 0 0 0 0  PHQ - 2 Score 0 0 0 0 0 0 0  Altered sleeping 2        Tired, decreased energy 0        Change in appetite 0        Feeling bad or failure about yourself  0        Trouble concentrating 0        Moving slowly or fidgety/restless 0  Suicidal thoughts 0        PHQ-9 Score 2        Difficult doing work/chores Somewhat difficult          Review of Systems  Constitutional:  Positive for unexpected weight change.  HENT: Negative.    Eyes: Negative.   Respiratory: Negative.    Cardiovascular:  Positive for leg swelling.  Gastrointestinal:  Negative.   Endocrine: Negative.   Genitourinary: Negative.   Musculoskeletal:  Positive for arthralgias and back pain.  Skin: Negative.   Allergic/Immunologic: Negative.   Neurological:  Positive for numbness.       Tingling  Hematological: Negative.   Psychiatric/Behavioral:  The patient is nervous/anxious.   All other systems reviewed and are negative.      Objective:   Physical Exam Gen: no distress, normal appearing, weight 256 lbs, BMI 40.10, 134/84 HEENT: oral mucosa pink and moist, NCAT, exopthalmos Gen: no distress, normal appearing HEENT: oral mucosa pink and moist, NCAT Cardio: Reg rate Chest: normal effort, normal rate of breathing Abd: soft, non-distended Ext: no edema Psych: pleasant, normal affect Skin: intact Musculoskeletal: Normal ambulation, right achiles tendinopathy, left heel spur    Assessment & Plan:  Mrs. Corzine is a 42 year old woman who presents to establish care for lumbar spinal stenosis.  1) Lumbar spinal stenosis with lumbar neurogenic claudication.  -Discussed benefits of exercise in reducing pain. -Refilled oxycodone -discussed negative response to gabapentin in the past.  Prescribed Zynex Nexwave and heating/cooling blanket  Continue UDS monitoring -Discussed current symptoms -She is allergic to bupivicaine, she has tried surgery, injections, medication. Oxycodone 5mg  helped. She has never failed a drug screen. She works at [employer]. Increase oxycodone to 5mg  daily.  -Will obtain UDS and pain contract today. If results negative, can prescribe Percocet 5mg -325 monthly PRN.  Lumbar MRI reviewed as follows:  IMPRESSION: 1. No impingement identified in the lumbar spine. 2. 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 3. Degenerative disc disease at T11-12, L2- 3, L3-4, and L5-S1. 4. Cholelithiasis.  -Discussed current symptoms of pain and history of pain.  -Discussed benefits of exercise in reducing  pain. -Discussed following foods that may reduce pain: 1) Ginger (especially studied for arthritis)- reduce leukotriene production to decrease inflammation 2) Blueberries- high in phytonutrients that decrease inflammation 3) Salmon- marine omega-3s reduce joint swelling and pain 4) Pumpkin seeds- reduce inflammation 5) dark chocolate- reduces inflammation 6) turmeric- reduces inflammation 7) tart cherries - reduce pain and stiffness 8) extra virgin olive oil - its compound olecanthal helps to block prostaglandins  9) chili peppers- can be eaten or applied topically via capsaicin 10) mint- helpful for headache, muscle aches, joint pain, and itching 11) garlic- reduces inflammation  Link to further information on diet for chronic pain: [email or web address]    2) Obesity: weight 256 lbs, BMI 40.10  -Educated regarding health benefits of weight loss- for pain, general health, chronic disease prevention, immune health, mental health.  -continue black coffee -Educated regarding health benefits of weight loss- for pain, general health, chronic disease prevention, immune health, mental health.  -Will monitor weight every visit.  -Consider Roobois tea daily.  -Discussed the benefits of intermittent fasting. -Discussed foods that can assist in weight loss: 1) leafy greens- high in fiber and nutrients 2) dark chocolate- improves metabolism (if prefer sweetened, best to sweeten with honey instead of sugar).  3) cruciferous vegetables- high in fiber and protein 4) full fat yogurt: high in healthy  fat, protein, calcium, and probiotics 5) apples- high in a variety of phytochemicals 6) nuts- high in fiber and protein that increase feelings of fullness 7) grapefruit: rich in nutrients, antioxidants, and fiber (not to be taken with anticoagulation) 8) beans- high in protein and fiber 9) salmon- has high quality protein and healthy  fats 10) green tea- rich in polyphenols 11) eggs- rich in choline and vitamin D 12) tuna- high protein, boosts metabolism 13) avocado- decreases visceral abdominal fat 14) chicken (pasture raised): high in protein and iron 15) blueberries- reduce abdominal fat and cholesterol 16) whole grains- decreases calories retained during digestion, speeds metabolism 17) chia seeds- curb appetite 18) chilies- increases fat metabolism  -Discussed supplements that can be used:  1) Metatrim 400mg  BID 30 minutes before breakfast and dinner  2) Sphaeranthus indicus and Garcinia mangostana (combinations of these and #1 can be found in capsicum and zychrome  3) green coffee bean extract 400mg  twice per day or Irvingia (african mango) 150 to 300mg  twice per day.   3) migraines -agree with PCP that imitrex is a great option. Discussed with her that there is increased risk of stroke. She states thyroid function is currently well controlled -discussed any potential triggers- she notes new perfume last week which could have been a trigger -advised to continue to be attentive for other food or chemical related triggers   4) HTN: -BP is 134/84 -Advised checking BP daily at home and logging results to bring into follow-up appointment with PCP and myself. -Reviewed BP meds today.  -Advised regarding healthy foods that can help lower blood pressure and provided with a list: 1) citrus foods- high in vitamins and minerals 2) salmon and other fatty fish - reduces inflammation and oxylipins 3) swiss chard (leafy green)- high level of nitrates 4) pumpkin seeds- one of the best natural sources of magnesium 5) Beans and lentils- high in fiber, magnesium, and potassium 6) Berries- high in flavonoids 7) Amaranth (whole grain, can be cooked similarly to rice and oats)- high in magnesium and fiber 8) Pistachios- even more effective at reducing BP than other nuts 9) Carrots- high in phenolic compounds that relax blood  vessels and reduce inflammation 10) Celery- contain phthalides that relax tissues of arterial walls 11) Tomatoes- can also improve cholesterol and reduce risk of heart disease 12) Broccoli- good source of magnesium, calcium, and potassium 13) Greek yogurt: high in potassium and calcium 14) Herbs and spices: Celery seed, cilantro, saffron, lemongrass, black cumin, ginseng, cinnamon, cardamom, sweet basil, and ginger 15) Chia and flax seeds- also help to lower cholesterol and blood sugar 16) Beets- high levels of nitrates that relax blood vessels  17) spinach and bananas- high in potassium  -Provided lise of supplements that can help with hypertension:  1) magnesium: one high quality brand is Bioptemizers since it contains all 7 types of magnesium, otherwise over the counter magnesium gluconate 400mg  is a good option 2) B vitamins 3) vitamin D 4) potassium 5) CoQ10 6) L-arginine 7) Vitamin C 8) Beetroot -Educated that goal BP is 120/80. -Made goal to incorporate some of the above foods into diet.    5) Left heel plantar fascitis: -discussed that she does not feel she needs shockwave therapy at this time  6) Right Achiles tendinopathy: -consider extracorporeal shockwave therapy in the future  7) Type 2 diabetes: -Discussed the benefits of intermittent fasting. Recommended starting with pushing dinner 15 minutes earlier and when this feels easy, continuing to push dinner 15 minutes earlier.  Discussed that this can help her body to improve its ability to burn fat rather than glucose, improving insulin sensitivity. Recommended drinking Roobois tea in the evening to help curb appetite and for its numerous health benefits.   -discussed most recent HgbA1c of 6.2. -discussed that she is doing an incredible job with her intermittent fasting -commended eating smaller dinners.  -continue steamed vegetables -continue protein shake -discussed the importance of fiber.   8) Nausea: refilled  zofran

## 2022-10-10 DIAGNOSIS — M4807 Spinal stenosis, lumbosacral region: Secondary | ICD-10-CM | POA: Diagnosis not present

## 2022-10-22 ENCOUNTER — Other Ambulatory Visit: Payer: Self-pay | Admitting: Family Medicine

## 2022-11-02 ENCOUNTER — Other Ambulatory Visit: Payer: Self-pay | Admitting: "Endocrinology

## 2022-11-10 DIAGNOSIS — M4807 Spinal stenosis, lumbosacral region: Secondary | ICD-10-CM | POA: Diagnosis not present

## 2022-11-26 ENCOUNTER — Other Ambulatory Visit: Payer: Self-pay | Admitting: Physical Medicine and Rehabilitation

## 2022-11-27 MED ORDER — OXYCODONE-ACETAMINOPHEN 5-325 MG PO TABS: 1.0000 | ORAL_TABLET | Freq: Two times a day (BID) | ORAL | 0 refills | Status: DC | PRN

## 2022-12-04 ENCOUNTER — Ambulatory Visit: Payer: BC Managed Care – PPO | Admitting: "Endocrinology

## 2022-12-05 ENCOUNTER — Encounter
Payer: BC Managed Care – PPO | Attending: Physical Medicine and Rehabilitation | Admitting: Physical Medicine and Rehabilitation

## 2022-12-05 ENCOUNTER — Encounter: Payer: Self-pay | Admitting: Physical Medicine and Rehabilitation

## 2022-12-05 VITALS — BP 140/80 | HR 61 | Ht 67.0 in | Wt 258.0 lb

## 2022-12-05 DIAGNOSIS — M79671 Pain in right foot: Secondary | ICD-10-CM | POA: Diagnosis not present

## 2022-12-05 DIAGNOSIS — F439 Reaction to severe stress, unspecified: Secondary | ICD-10-CM | POA: Insufficient documentation

## 2022-12-05 MED ORDER — OXYCODONE-ACETAMINOPHEN 5-325 MG PO TABS: 1.0000 | ORAL_TABLET | Freq: Two times a day (BID) | ORAL | 0 refills | Status: DC | PRN

## 2022-12-05 NOTE — Addendum Note (Signed)
Addended by: Horton Chin on: 12/05/2022 01:15 PM   Modules accepted: Level of Service

## 2022-12-05 NOTE — Progress Notes (Addendum)
Subjective:    Patient ID: Teresa Vincent, female    DOB: 11/08/1980, 42 y.o.   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  -she felt no benefit from going to the chiropractor.  -pain has been severe recently -twice a month she is working 7 days a week and she talked with her boss's boss about slowing down a little and he is seeing if this possible.  -she takes care of the garden center at [employer] -Pain has been present for years -She has had two back surgeries.  -She has had 7 injections but got no relief from these.  -She has gone back to Dr. Yetta Barre who did her surgery.  -She was on oxycodone 5mg  that she broke in half to 2.5s which helped. This helped her to get through the day. She has been on it for years. She has been following with Riley Lam.  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  -feels better when laying down, but also painful while sitting -today has been a bad day -she uses her electric blanket and heating pad -she has been working at [employer]- she is the cold at 5am in the morning and this is when the pain is worse.  -the colder it is the worse it is hurting -she got a year membership with chiropractor and has been there for a month. She has not found benefit yet.  2) left knee pain: -has had steroid injection with benefit.  -this has continued to benefit.   3) left foot pain -prescribed meloxicam by Dr. Adriana Simas -seeing a doctor today and will told whether she needs surgical pain. She wants whatever is necessary to stop the pain -her arch  stays swollen -she was supposed to do PT last month but was unable to get in until 6/6 -still present  4) Anxiety -she gets anxious about driving and she gets palpitations -she has had a prior stroke and has felt this anxiety since this time.   5) migraines -has at baseline but last week was very severe, particularly Wednesday -she considered going to the ED on Wednesday due to her pain but pain improved without new intervention by Thursday -she saw her PCP this morning and was recommended to take Imitrex -she does have a history of remote stroke which was thought to be related to her thyroid disease -she has been doing ok now -her thyroid levels had gone way out of control.  -she received a shot in the ED for her pain  -pain has been stable -she was hopeful that the more she lost weight her pain would improve  6) Loss of cervical lordosis: -working with chiropractor on improving this and has already seen improvements -joinged a gym  7) morbid obesity -she is put on a strict diet with her endocrinologist.  -lost 22 lbs in a month. She went from 262 to 244.2 today.  -husband is doing the same diet with her and he lost 34 lbs.  -they do portion control sizes and they  eat meat once per week -they went vegan with a lot of things, cream cheese and cheese -they have a cheat day once per week.   8) Left foot pain -uses a boot to help with the pain -when she is at work and she gets going its ok, but in the beginning she has a lot of pain -she would like to try ECSWT for this foot as well.   9) Achilles tendinosis, right -she is considering surgery -she is scheduled with me for ECSWT  10) Type 2 diabetes -every now and then she will have a cheat day -she thinks her endocrinologist will be upset with her that her HgbA1c improves -she has lost 36 lbs -she has stopped soft drinks -she shows be her improvements in her lipid panel -discussed that se loves greens, hummus -her last  bite is often 5:30-6;:30, sometimes 7pm if she is hungry.   11) Right foot pain: -much better after shockwave therapy -continues to have improvement  12) Stress: -regarding getting custody of her grandkids as her eldest daughter has a drug addiction  Pain Inventory Average Pain 8 Pain Right Now 7 My pain is constant, sharp, burning, dull, stabbing, and aching  In the last 24 hours, has pain interfered with the following? General activity 7 Relation with others 7 Enjoyment of life 7 What TIME of day is your pain at its worst? morning , daytime, evening, and night Sleep (in general) Fair Pain is worse with: walking, bending, sitting, inactivity, standing, and some activites Pain improves with: medication  Relief from Meds: 9     Family History  Problem Relation Age of Onset   Hypertension Mother    Thyroid disease Neg Hx    Social History   Socioeconomic History   Marital status: Married    Spouse name: Not on file   Number of children: Not on file   Years of education: Not on file   Highest education level: Not on file  Occupational History   Occupation: Doesn't work outside the home  Tobacco Use   Smoking status: Former    Current packs/day: 0.00    Average packs/day: 1 pack/day for 26.0 years (26.0 ttl pk-yrs)    Types: Cigarettes    Start date: 01/08/1995    Quit date: 01/07/2021    Years since quitting: 1.9   Smokeless tobacco: Former  Building services engineer status: Every Day   Substances: Nicotine, Flavoring  Substance and Sexual Activity   Alcohol use: Yes    Alcohol/week: 0.0 standard drinks of alcohol    Comment: occasional   Drug use: Not Currently    Types: Marijuana    Comment: occasional   Sexual activity: Not Currently    Birth control/protection: Surgical  Other Topics Concern   Not on file  Social History Narrative   Not on file   Social Determinants of Health   Financial Resource Strain: Not on file  Food Insecurity: Not on file   Transportation Needs: Not on file  Physical Activity: Not on file  Stress: Not on file  Social Connections: Not on file   Past Surgical History:  Procedure Laterality Date   ABDOMINAL EXPOSURE N/A 05/29/2017   Procedure: ABDOMINAL EXPOSURE;  Surgeon: Chuck Hint, MD;  Location: [facility] OR;  Service: Vascular;  Laterality: N/A;   ABDOMINAL HYSTERECTOMY     ANTERIOR LUMBAR FUSION N/A 05/29/2017   Procedure: Anterior Lumbar Interbody Fusion  - Lumbar five sacral one;  Surgeon: Marikay Alar  S, MD;  Location: [facility] OR;  Service: Neurosurgery;  Laterality: N/A;   BIOPSY  02/17/2020   Procedure: BIOPSY;  Surgeon: Lanelle Bal, DO;  Location: [facility] SU[facility];  Service: Endoscopy;;  gastric   CARPAL TUNNEL RELEASE Bilateral    CHOLECYSTECTOMY     CHOLECYSTECTOMY N/A 12/29/2016   Procedure: LAPAROSCOPIC CHOLECYSTECTOMY;  Surgeon: [name], MD;  Location: [facility] ORS;  Service: General;  Laterality: N/A;   COLONOSCOPY WITH PROPOFOL N/A 02/17/2020   non-bleeding internal hemorrhoids, otherwise normal.   CYSTOSCOPY/URETEROSCOPY/HOLMIUM LASER/STENT PLACEMENT Right 06/25/2019   Procedure: CYSTOSCOPY WITH RIGHT RETROGRADE PYELOGRAM/RIGHT URETEROSCOPY/HOLMIUM LASER APPLICATION RIGHT URETERAL CALCULUS/RIGHT URETERAL STENT PLACEMENT;  Surgeon: Malen Gauze, MD;  Location: [facility] ORS;  Service: Urology;  Laterality: Right;   ESOPHAGOGASTRODUODENOSCOPY (EGD) WITH PROPOFOL N/A 02/17/2020   Procedure: ESOPHAGOGASTRODUODENOSCOPY (EGD) WITH PROPOFOL;  Surgeon: Lanelle Bal, DO;  Location: [facility];  Service: Endoscopy;  Laterality: N/A;   EXTRACORPOREAL SHOCK WAVE LITHOTRIPSY Right 04/10/2019   Procedure: EXTRACORPOREAL SHOCK WAVE LITHOTRIPSY (ESWL);  Surgeon: Noel Christmas, MD;  Location[facility];  Service: Urology;  Laterality: Right;   LAMINECTOMY WITH POSTERIOR LATERAL ARTHRODESIS LEVEL 1 N/A 02/28/2018   Procedure: Laminectomy and Foraminotomy Lumbar Five-Sacral  One, posterolateral fusion and fixation;  Surgeon: Tia Alert, MD;  Location[facility] OR;  Service: Neurosurgery;  Laterality: N/A;  posterior   LUMBAR EPIDURAL INJECTION     STERIOD INJECTION N/A 01/10/2017   Procedure: MINOR EXPAREL INJECTION;  Surgeon: [name], MD;  Location: [facility];  Service: General;  Laterality: N/A;   TOTAL ABDOMINAL HYSTERECTOMY W/ BILATERAL SALPINGOOPHORECTOMY N/A    TUBAL LIGATION  2003   Past Medical History:  Diagnosis Date   Abnormal Pap smear of cervix    ANXIETY 08/30/2007   Asthma    Chronic back pain    Claustrophobia    pt requests Versed before surgery.   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   Ht 5\' 7"  (1.702 m)   Wt 258 lb (117 kg)   LMP 02/25/2016   BMI 40.41 kg/m   Opioid Risk Score:   Fall Risk Score:  `1  Depression screen [facility] 2/9     12/05/2022   12:24 PM 10/02/2022    2:55 PM 04/07/2022    1:39 PM 03/21/2022    2:19 PM 03/14/2022   10:20 AM 02/28/2022    1:59 PM 02/24/2022   10:53 AM  Depression screen PHQ 2/9  Decreased Interest 0 0 0 0 0 0 0  Down, Depressed, Hopeless 0 0 0 0 0 0 0  PHQ - 2 Score 0 0 0 0 0 0 0  Altered sleeping   2      Tired, decreased energy   0      Change in appetite   0      Feeling bad or failure about yourself    0      Trouble concentrating   0      Moving slowly  or fidgety/restless   0      Suicidal thoughts   0      PHQ-9 Score   2      Difficult doing work/chores   Somewhat difficult        Review of Systems  Constitutional:  Positive for unexpected weight change.  HENT: Negative.    Eyes: Negative.    Respiratory: Negative.    Cardiovascular:  Positive for leg swelling.  Gastrointestinal: Negative.   Endocrine: Negative.   Genitourinary: Negative.   Musculoskeletal:  Positive for arthralgias and back pain.  Skin: Negative.   Allergic/Immunologic: Negative.   Neurological:  Positive for numbness.       Tingling  Hematological: Negative.   Psychiatric/Behavioral:  The patient is nervous/anxious.   All other systems reviewed and are negative.      Objective:   Physical Exam Gen: no distress, normal appearing, weight 258 lbs, BMI 40.41, 134/84 HEENT: oral mucosa pink and moist, NCAT, exopthalmos Gen: no distress, normal appearing HEENT: oral mucosa pink and moist, NCAT Cardio: Reg rate Gen: no distress, normal appearing HEENT: oral mucosa pink and moist, NCAT Cardio: Reg rate Chest: normal effort, normal rate of breathing Abd: soft, non-distended Ext: no edema Psych: pleasant, normal affect Musculoskeletal: Normal ambulation, right achiles tendinopathy, left heel spur    Assessment & Plan:  Teresa Vincent is a 42 year old woman who presents to establish care for lumbar spinal stenosis.  1) Lumbar spinal stenosis with lumbar neurogenic claudication.  -Discussed benefits of exercise in reducing pain. -Refilled oxycodone -discussed that her stress has not worsen her pain -continue heat therapy -discussed negative response to gabapentin in the past.  Prescribing Home Zynex NexWave Stimulator Device and supplies as needed. IFC, NMES and TENS medically necessary Treatment Rx: Daily @ 30-40 minutes per treatment PRN. Zynex NexWave only, no substitutions. Treatment Goals: 1) To reduce and/or eliminate pain 2) To improve functional capacity and Activities of daily living 3) To reduce or prevent the need for oral medications 4) To improve circulation in the injured region 5) To decrease or prevent muscle spasm and muscle atrophy 6) To provide a self-management tool to the  patient The patient has not sufficiently improved with conservative care. Numerous studies indexed by Medline and PubMed.gov have shown Neuromuscular, Interferential, and TENS stimulators to reduce pain, improve function, and reduce medication use in injured patients. Continued use of this evidence based, safe, drug free treatment is both reasonable and medically necessary at this time.    Continue UDS monitoring -Discussed current symptoms -She is allergic to bupivicaine, she has tried surgery, injections, medication. Oxycodone 5mg  helped. She has never failed a drug screen. She works at [employer]. Increase oxycodone to 5mg  daily.  -Will obtain UDS and pain contract today. If results negative, can prescribe Percocet 5mg -325 monthly PRN.  Lumbar MRI reviewed as follows:  IMPRESSION: 1. No impingement identified in the lumbar spine. 2. 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 3. Degenerative disc disease at T11-12, L2- 3, L3-4, and L5-S1. 4. Cholelithiasis.  -Discussed current symptoms of pain and history of pain.  -Discussed benefits of exercise in reducing pain. -Discussed following foods that may reduce pain: 1) Ginger (especially studied for arthritis)- reduce leukotriene production to decrease inflammation 2) Blueberries- high in phytonutrients that decrease inflammation 3) Salmon- marine omega-3s reduce joint swelling and pain 4) Pumpkin seeds- reduce inflammation 5) dark chocolate- reduces inflammation 6) turmeric- reduces inflammation 7) tart cherries - reduce pain  and stiffness 8) extra virgin olive oil - its compound olecanthal helps to block prostaglandins  9) chili peppers- can be eaten or applied topically via capsaicin 10) mint- helpful for headache, muscle aches, joint pain, and itching 11) garlic- reduces inflammation  Link to further information on diet for chronic pain:  http://www.bray.com/    2) Obesity: weight 256 lbs, BMI 40.10  -Educated regarding health benefits of weight loss- for pain, general health, chronic disease prevention, immune health, mental health.  -continue black coffee -Educated regarding health benefits of weight loss- for pain, general health, chronic disease prevention, immune health, mental health.  -Will monitor weight every visit.  -Consider Roobois tea daily.  -Discussed the benefits of intermittent fasting. -Discussed foods that can assist in weight loss: 1) leafy greens- high in fiber and nutrients 2) dark chocolate- improves metabolism (if prefer sweetened, best to sweeten with honey instead of sugar).  3) cruciferous vegetables- high in fiber and protein 4) full fat yogurt: high in healthy fat, protein, calcium, and probiotics 5) apples- high in a variety of phytochemicals 6) nuts- high in fiber and protein that increase feelings of fullness 7) grapefruit: rich in nutrients, antioxidants, and fiber (not to be taken with anticoagulation) 8) beans- high in protein and fiber 9) salmon- has high quality protein and healthy fats 10) green tea- rich in polyphenols 11) eggs- rich in choline and vitamin D 12) tuna- high protein, boosts metabolism 13) avocado- decreases visceral abdominal fat 14) chicken (pasture raised): high in protein and iron 15) blueberries- reduce abdominal fat and cholesterol 16) whole grains- decreases calories retained during digestion, speeds metabolism 17) chia seeds- curb appetite 18) chilies- increases fat metabolism  -Discussed supplements that can be used:  1) Metatrim 400mg  BID 30 minutes before breakfast and dinner  2) Sphaeranthus indicus and Garcinia mangostana (combinations of these and #1 can be found in capsicum and zychrome  3) green coffee bean extract 400mg  twice per day or Irvingia (african mango) 150 to 300mg  twice  per day.   3) migraines -agree with PCP that imitrex is a great option. Discussed with her that there is increased risk of stroke. She states thyroid function is currently well controlled -discussed any potential triggers- she notes new perfume last week which could have been a trigger -advised to continue to be attentive for other food or chemical related triggers   4) HTN: -BP is 134/84 -Advised checking BP daily at home and logging results to bring into follow-up appointment with PCP and myself. -Reviewed BP meds today.  -Advised regarding healthy foods that can help lower blood pressure and provided with a list: 1) citrus foods- high in vitamins and minerals 2) salmon and other fatty fish - reduces inflammation and oxylipins 3) swiss chard (leafy green)- high level of nitrates 4) pumpkin seeds- one of the best natural sources of magnesium 5) Beans and lentils- high in fiber, magnesium, and potassium 6) Berries- high in flavonoids 7) Amaranth (whole grain, can be cooked similarly to rice and oats)- high in magnesium and fiber 8) Pistachios- even more effective at reducing BP than other nuts 9) Carrots- high in phenolic compounds that relax blood vessels and reduce inflammation 10) Celery- contain phthalides that relax tissues of arterial walls 11) Tomatoes- can also improve cholesterol and reduce risk of heart disease 12) Broccoli- good source of magnesium, calcium, and potassium 13) Greek yogurt: high in potassium and calcium 14) Herbs and spices: Celery seed, cilantro, saffron, lemongrass, black cumin, ginseng, cinnamon, cardamom, sweet  basil, and ginger 15) Chia and flax seeds- also help to lower cholesterol and blood sugar 16) Beets- high levels of nitrates that relax blood vessels  17) spinach and bananas- high in potassium  -Provided lise of supplements that can help with hypertension:  1) magnesium: one high quality brand is Bioptemizers since it contains all 7 types of  magnesium, otherwise over the counter magnesium gluconate 400mg  is a good option 2) B vitamins 3) vitamin D 4) potassium 5) CoQ10 6) L-arginine 7) Vitamin C 8) Beetroot -Educated that goal BP is 120/80. -Made goal to incorporate some of the above foods into diet.    5) Left heel plantar fascitis: -discussed that she does not feel she needs shockwave therapy at this time  6) Right Achiles tendinopathy: -consider extracorporeal shockwave therapy in the future  7) Type 2 diabetes: -Discussed the benefits of intermittent fasting. Recommended starting with pushing dinner 15 minutes earlier and when this feels easy, continuing to push dinner 15 minutes earlier. Discussed that this can help her body to improve its ability to burn fat rather than glucose, improving insulin sensitivity. Recommended drinking Roobois tea in the evening to help curb appetite and for its numerous health benefits.   -discussed most recent HgbA1c of 6.2. -discussed that she is doing an incredible job with her intermittent fasting -commended eating smaller dinners.  -continue steamed vegetables -continue protein shake -discussed the importance of fiber.   8) Nausea: refilled zofran  9) Stress: Discussed stress from obtaining the custody of her grandkids due to her daughter having issues with drug abuse  >40 minutes spent in discussion of obesity, stress, right plantar fascitis, low back pain, discussed that she tries to stay away from red meat, discussed benefits from the extracorporeal shockwave therapy, discussed her husband's knee pain and work hours, discussed that she tries to prioritize her sleep, discussed that she is working 2 jobs

## 2022-12-11 DIAGNOSIS — M4807 Spinal stenosis, lumbosacral region: Secondary | ICD-10-CM | POA: Diagnosis not present

## 2022-12-21 ENCOUNTER — Other Ambulatory Visit: Payer: Self-pay | Admitting: Family Medicine

## 2023-01-10 DIAGNOSIS — M4807 Spinal stenosis, lumbosacral region: Secondary | ICD-10-CM | POA: Diagnosis not present

## 2023-01-13 ENCOUNTER — Other Ambulatory Visit: Payer: Self-pay | Admitting: Physical Medicine and Rehabilitation

## 2023-01-16 ENCOUNTER — Telehealth: Payer: Self-pay | Admitting: *Deleted

## 2023-01-16 DIAGNOSIS — R059 Cough, unspecified: Secondary | ICD-10-CM | POA: Diagnosis not present

## 2023-01-16 DIAGNOSIS — J349 Unspecified disorder of nose and nasal sinuses: Secondary | ICD-10-CM | POA: Diagnosis not present

## 2023-01-16 DIAGNOSIS — J22 Unspecified acute lower respiratory infection: Secondary | ICD-10-CM | POA: Diagnosis not present

## 2023-01-16 MED ORDER — OXYCODONE-ACETAMINOPHEN 5-325 MG PO TABS: 1.0000 | ORAL_TABLET | Freq: Two times a day (BID) | ORAL | 0 refills | Status: DC | PRN

## 2023-01-16 NOTE — Telephone Encounter (Signed)
Teresa Vincent called tor remind Dr Carlis Abbott she needs a refill on her pain medication. She sent a MyChart request as well on 10.27/24.

## 2023-02-02 ENCOUNTER — Encounter
Payer: BC Managed Care – PPO | Attending: Physical Medicine and Rehabilitation | Admitting: Physical Medicine and Rehabilitation

## 2023-02-02 VITALS — BP 145/91 | HR 70 | Ht 67.0 in | Wt 257.0 lb

## 2023-02-02 DIAGNOSIS — M48062 Spinal stenosis, lumbar region with neurogenic claudication: Secondary | ICD-10-CM | POA: Insufficient documentation

## 2023-02-02 DIAGNOSIS — M722 Plantar fascial fibromatosis: Secondary | ICD-10-CM | POA: Diagnosis not present

## 2023-02-02 DIAGNOSIS — G629 Polyneuropathy, unspecified: Secondary | ICD-10-CM | POA: Insufficient documentation

## 2023-02-02 MED ORDER — OXYCODONE-ACETAMINOPHEN 5-325 MG PO TABS: 1.0000 | ORAL_TABLET | Freq: Two times a day (BID) | ORAL | 0 refills | Status: DC | PRN

## 2023-02-02 NOTE — Patient Instructions (Signed)
HTN: -BP is ___today.  -Advised checking BP daily at home and logging results to bring into follow-up appointment with PCP and myself. -Reviewed BP meds today.  -Advised regarding healthy foods that can help lower blood pressure and provided with a list: 1) citrus foods- high in vitamins and minerals 2) salmon and other fatty fish - reduces inflammation and oxylipins 3) swiss chard (leafy green)- high level of nitrates 4) pumpkin seeds- one of the best natural sources of magnesium 5) Beans and lentils- high in fiber, magnesium, and potassium 6) Berries- high in flavonoids 7) Amaranth (whole grain, can be cooked similarly to rice and oats)- high in magnesium and fiber 8) Pistachios- even more effective at reducing BP than other nuts 9) Carrots- high in phenolic compounds that relax blood vessels and reduce inflammation 10) Celery- contain phthalides that relax tissues of arterial walls 11) Tomatoes- can also improve cholesterol and reduce risk of heart disease 12) Broccoli- good source of magnesium, calcium, and potassium 13) Greek yogurt: high in potassium and calcium 14) Herbs and spices: Celery seed, cilantro, saffron, lemongrass, black cumin, ginseng, cinnamon, cardamom, sweet basil, and ginger 15) Chia and flax seeds- also help to lower cholesterol and blood sugar 16) Beets- high levels of nitrates that relax blood vessels  17) spinach and bananas- high in potassium  -Provided lise of supplements that can help with hypertension:  1) magnesium: one high quality brand is Bioptemizers since it contains all 7 types of magnesium, otherwise over the counter magnesium gluconate 400mg is a good option 2) B vitamins 3) vitamin D 4) potassium 5) CoQ10 6) L-arginine 7) Vitamin C 8) Beetroot -Educated that goal BP is 120/80. -Made goal to incorporate some of the above foods into diet.   

## 2023-02-02 NOTE — Progress Notes (Signed)
Subjective:    Patient ID: Teresa Vincent, female    DOB: December 01, 1980, 42 y.o.   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  -she felt no benefit from going to the chiropractor.  -pain has been severe recently -twice a month she is working 7 days a week and she talked with her boss's boss about slowing down a little and he is seeing if this possible.  -she takes care of the garden center at [employer] -Pain has been present for years -She has had two back surgeries.  -She has had 7 injections but got no relief from these.  -She has gone back to Dr. Yetta Barre who did her surgery.  -She was on oxycodone 5mg  that she broke in half to 2.5s which helped. This helped her to get through the day. She has been on it for years. She has been following with Riley Lam.  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  -feels better when laying down, but also painful while sitting -today has been a bad day -she uses her electric blanket and heating pad -she has been working at [employer]- she is the cold at 5am in the morning and this is when the pain is worse.  -the colder it is the worse it is hurting -she got a year membership with chiropractor and has been there for a month. She has not found benefit yet.  2) left knee pain: -has had steroid  injection with benefit.  -this has continued to benefit.   3) left foot pain -prescribed meloxicam by Dr. Adriana Simas -seeing a doctor today and will told whether she needs surgical pain. She wants whatever is necessary to stop the pain -her arch stays swollen -she was supposed to do PT last month but was unable to get in until 6/6 -still present  4) Anxiety -she gets anxious about driving and she gets palpitations -she has had a prior stroke and has felt this anxiety since this time.   5) migraines -has at baseline but last week was very severe, particularly Wednesday -she considered going to the ED on Wednesday due to her pain but pain improved without new intervention by Thursday -she saw her PCP this morning and was recommended to take Imitrex -she does have a history of remote stroke which was thought to be related to her thyroid disease -she has been doing ok now -her thyroid levels had gone way out of control.  -she received a shot in the ED for her pain  -pain has been stable -she was hopeful that the more she lost weight her pain would improve  6) Loss of cervical lordosis: -working with chiropractor on improving this and has already seen improvements -joinged a gym  7) morbid obesity -she is put on a strict diet with her endocrinologist.  -lost 22 lbs in a month. She went from 262 to 244.2 today.  -husband is doing the same diet with her and he lost 34 lbs.  -they do portion control sizes and they eat meat once per week -they went vegan with a lot of things, cream cheese and cheese -they have a cheat day once per week.   8) Left foot pain Resolved with [facility]  9) Achilles tendinosis, right -she is considering surgery -she is scheduled with me for [facility]  10) Type 2 diabetes -every now and then she will have a cheat day -she thinks her endocrinologist will be upset with her that her HgbA1c improves -she has lost 36 lbs -she has stopped soft drinks -she shows be her  improvements in her lipid panel -discussed that se loves greens, hummus -her last bite is often 5:30-6;:30, sometimes 7pm if she is hungry.   11) Right foot pain: -much better after shockwave therapy -continues to have improvement -resolved  12) Stress: -regarding getting custody of her grandkids as her eldest daughter has a drug addiction  Pain Inventory Average Pain 8 Pain Right Now 8 My pain is constant, sharp, burning, dull, stabbing, and aching  In the last 24 hours, has pain interfered with the following? General activity 10 Relation with others 10 Enjoyment of life 10 What TIME of day is your pain at its worst? morning , daytime, evening, and night Sleep (in general) Fair Pain is worse with: walking, bending, sitting, inactivity, standing, and some activites Pain improves with: medication  Relief from Meds: 5     Family History  Problem Relation Age of Onset   Hypertension Mother    Thyroid disease Neg Hx    Social History   Socioeconomic History   Marital status: Married    Spouse name: Not on file   Number of children: Not on file   Years of education: Not on file   Highest education level: Not on file  Occupational History   Occupation: Doesn't work outside the home  Tobacco Use   Smoking status: Former    Current packs/day: 0.00    Average packs/day: 1 pack/day for 26.0 years (26.0 ttl pk-yrs)    Types: Cigarettes    Start date: 01/08/1995    Quit date: 01/07/2021    Years since quitting: 2.0   Smokeless tobacco: Former  Building services engineer status: Every Day   Substances: Nicotine, Flavoring  Substance and Sexual Activity   Alcohol use: Yes    Alcohol/week: 0.0 standard drinks of alcohol    Comment: occasional   Drug use: Not Currently    Types: Marijuana    Comment: occasional   Sexual activity: Not Currently    Birth control/protection: Surgical  Other Topics Concern   Not on file  Social History Narrative   Not on file   Social  Determinants of Health   Financial Resource Strain: Not on file  Food Insecurity: Not on file  Transportation Needs: Not on file  Physical Activity: Not on file  Stress: Not on file  Social Connections: Not on file   Past Surgical History:  Procedure Laterality Date   ABDOMINAL EXPOSURE N/A 05/29/2017   Procedure: ABDOMINAL EXPOSURE;  Surgeon: Chuck Hint, MD;  Location[facility] OR;  Service: Vascular;  Laterality: N/A;   ABDOMINAL HYSTERECTOMY     ANTERIOR LUMBAR FUSION N/A 05/29/2017   Procedure: Anterior Lumbar  Interbody Fusion  - Lumbar five sacral one;  Surgeon: Tia Alert, MD;  Location[facility] OR;  Service: Neurosurgery;  Laterality: N/A;   BIOPSY  02/17/2020   Procedure: BIOPSY;  Surgeon: Lanelle Bal, DO;  Location: [facility];  Service: Endoscopy;;  gastric   CARPAL TUNNEL RELEASE Bilateral    CHOLECYSTECTOMY     CHOLECYSTECTOMY N/A 12/29/2016   Procedure: LAPAROSCOPIC CHOLECYSTECTOMY;  Surgeon: [name], MD;  Location: [facility] ORS;  Service: General;  Laterality: N/A;   COLONOSCOPY WITH PROPOFOL N/A 02/17/2020   non-bleeding internal hemorrhoids, otherwise normal.   CYSTOSCOPY/URETEROSCOPY/HOLMIUM LASER/STENT PLACEMENT Right 06/25/2019   Procedure: CYSTOSCOPY WITH RIGHT RETROGRADE PYELOGRAM/RIGHT URETEROSCOPY/HOLMIUM LASER APPLICATION RIGHT URETERAL CALCULUS/RIGHT URETERAL STENT PLACEMENT;  Surgeon: Malen Gauze, MD;  Location: [facility];  Service: Urology;  Laterality: Right;   ESOPHAGOGASTRODUODENOSCOPY (EGD) WITH PROPOFOL N/A 02/17/2020   Procedure: ESOPHAGOGASTRODUODENOSCOPY (EGD) WITH PROPOFOL;  Surgeon: Lanelle Bal, DO;  Location: [facility];  Service: Endoscopy;  Laterality: N/A;   EXTRACORPOREAL SHOCK WAVE LITHOTRIPSY Right 04/10/2019   Procedure: EXTRACORPOREAL SHOCK WAVE LITHOTRIPSY (ESWL);  Surgeon: Noel Christmas, MD;  Location: [facility];  Service: Urology;  Laterality: Right;   LAMINECTOMY WITH POSTERIOR LATERAL  ARTHRODESIS LEVEL 1 N/A 02/28/2018   Procedure: Laminectomy and Foraminotomy Lumbar Five-Sacral One, posterolateral fusion and fixation;  Surgeon: Tia Alert, MD;  Location: [facility] OR;  Service: Neurosurgery;  Laterality: N/A;  posterior   LUMBAR EPIDURAL INJECTION     STERIOD INJECTION N/A 01/10/2017   Procedure: MINOR EXPAREL INJECTION;  Surgeon: [name], MD;  Location: [facility];  Service: General;  Laterality: N/A;   TOTAL ABDOMINAL HYSTERECTOMY W/ BILATERAL SALPINGOOPHORECTOMY N/A    TUBAL LIGATION  2003   Past Medical History:  Diagnosis Date   Abnormal Pap smear of cervix    ANXIETY 08/30/2007   Asthma    Chronic back pain    Claustrophobia    pt requests Versed before surgery.   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   BP (!) 145/91   Pulse 70   Ht 5\' 7"  (1.702 m)   Wt 257 lb (116.6 kg)   LMP 02/25/2016   SpO2 96%   BMI 40.25 kg/m   Opioid Risk Score:   Fall Risk Score:  `1  Depression screen [facility] 2/9     12/05/2022   12:24 PM 10/02/2022    2:55 PM 04/07/2022    1:39 PM 03/21/2022    2:19 PM 03/14/2022   10:20 AM 02/28/2022    1:59 PM 02/24/2022   10:53 AM  Depression screen PHQ 2/9  Decreased Interest 0 0 0 0 0 0 0  Down, Depressed, Hopeless 0 0 0 0 0 0 0  PHQ - 2 Score 0 0 0 0 0 0 0  Altered sleeping   2      Tired, decreased energy   0      Change in appetite   0      Feeling bad  or failure about yourself    0      Trouble concentrating   0      Moving slowly or fidgety/restless   0      Suicidal thoughts   0      PHQ-9 Score   2      Difficult doing work/chores   Somewhat  difficult        Review of Systems  Constitutional:  Positive for unexpected weight change.  HENT: Negative.    Eyes: Negative.   Respiratory: Negative.    Cardiovascular:  Positive for leg swelling.  Gastrointestinal: Negative.   Endocrine: Negative.   Genitourinary: Negative.   Musculoskeletal:  Positive for arthralgias and back pain.  Skin: Negative.   Allergic/Immunologic: Negative.   Neurological:  Positive for numbness.       Tingling  Hematological: Negative.   Psychiatric/Behavioral:  The patient is nervous/anxious.   All other systems reviewed and are negative.      Objective:   Physical Exam PRIOR EXAM: Gen: no distress, normal appearing, weight 258 lbs, BMI 40.41, 134/84 HEENT: oral mucosa pink and moist, NCAT, exopthalmos Gen: no distress, normal appearing HEENT: oral mucosa pink and moist, NCAT Cardio: Reg rate Gen: no distress, normal appearing HEENT: oral mucosa pink and moist, NCAT Cardio: Reg rate Chest: normal effort, normal rate of breathing Abd: soft, non-distended Ext: no edema Psych: pleasant, normal affect Musculoskeletal: Normal ambulation, right achiles tendinopathy, left heel spur    Assessment & Plan:  Mrs. Vath is a 42 year old woman who presents to establish care for lumbar spinal stenosis/neuropathy  1) Lumbar spinal stenosis with lumbar neurogenic claudication.  -Discussed benefits of exercise in reducing pain. -refilled percocet  1 patch of Qutenza was applied to the area of pain. Ice packs were applied during the procedure to ensure patient comfort. Blood pressure was monitored every 15 minutes. The patient tolerated the procedure well. Post-procedure instructions were given and follow-up has been scheduled.    -discussed that her stress has not worsen her pain -continue heat therapy -discussed negative response to gabapentin in the past.  Prescribing Home Zynex NexWave Stimulator Device and supplies as needed. IFC, NMES and  TENS medically necessary Treatment Rx: Daily @ 30-40 minutes per treatment PRN. Zynex NexWave only, no substitutions. Treatment Goals: 1) To reduce and/or eliminate pain 2) To improve functional capacity and Activities of daily living 3) To reduce or prevent the need for oral medications 4) To improve circulation in the injured region 5) To decrease or prevent muscle spasm and muscle atrophy 6) To provide a self-management tool to the patient The patient has not sufficiently improved with conservative care. Numerous studies indexed by Medline and PubMed.gov have shown Neuromuscular, Interferential, and TENS stimulators to reduce pain, improve function, and reduce medication use in injured patients. Continued use of this evidence based, safe, drug free treatment is both reasonable and medically necessary at this time.    Continue UDS monitoring -Discussed current symptoms -She is allergic to bupivicaine, she has tried surgery, injections, medication. Oxycodone 5mg  helped. She has never failed a drug screen. She works at [employer]. Increase oxycodone to 5mg  daily.  -Will obtain UDS and pain contract today. If results negative, can prescribe Percocet 5mg -325 monthly PRN.  Lumbar MRI reviewed as follows:  IMPRESSION: 1. No impingement identified in the lumbar spine. 2. 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 3. Degenerative disc disease at T11-12, L2- 3, L3-4, and L5-S1. 4.  Cholelithiasis.  -Discussed current symptoms of pain and history of pain.  -Discussed benefits of exercise in reducing pain. -Discussed following foods that may reduce pain: 1) Ginger (especially studied for arthritis)- reduce leukotriene production to decrease inflammation 2) Blueberries- high in phytonutrients that decrease inflammation 3) Salmon- marine omega-3s reduce joint swelling and pain 4) Pumpkin seeds- reduce inflammation 5) dark chocolate- reduces inflammation 6) turmeric- reduces  inflammation 7) tart cherries - reduce pain and stiffness 8) extra virgin olive oil - its compound olecanthal helps to block prostaglandins  9) chili peppers- can be eaten or applied topically via capsaicin 10) mint- helpful for headache, muscle aches, joint pain, and itching 11) garlic- reduces inflammation  Link to further information on diet for chronic pain: http://www.bray.com/    2) Obesity: weight 256 lbs, BMI 40.10  -Educated regarding health benefits of weight loss- for pain, general health, chronic disease prevention, immune health, mental health.  -continue black coffee -Educated regarding health benefits of weight loss- for pain, general health, chronic disease prevention, immune health, mental health.  -Will monitor weight every visit.  -Consider Roobois tea daily.  -Discussed the benefits of intermittent fasting. -Discussed foods that can assist in weight loss: 1) leafy greens- high in fiber and nutrients 2) dark chocolate- improves metabolism (if prefer sweetened, best to sweeten with honey instead of sugar).  3) cruciferous vegetables- high in fiber and protein 4) full fat yogurt: high in healthy fat, protein, calcium, and probiotics 5) apples- high in a variety of phytochemicals 6) nuts- high in fiber and protein that increase feelings of fullness 7) grapefruit: rich in nutrients, antioxidants, and fiber (not to be taken with anticoagulation) 8) beans- high in protein and fiber 9) salmon- has high quality protein and healthy fats 10) green tea- rich in polyphenols 11) eggs- rich in choline and vitamin D 12) tuna- high protein, boosts metabolism 13) avocado- decreases visceral abdominal fat 14) chicken (pasture raised): high in protein and iron 15) blueberries- reduce abdominal fat and cholesterol 16) whole grains- decreases calories retained during digestion, speeds metabolism 17) chia seeds-  curb appetite 18) chilies- increases fat metabolism  -Discussed supplements that can be used:  1) Metatrim 400mg  BID 30 minutes before breakfast and dinner  2) Sphaeranthus indicus and Garcinia mangostana (combinations of these and #1 can be found in capsicum and zychrome  3) green coffee bean extract 400mg  twice per day or Irvingia (african mango) 150 to 300mg  twice per day.   3) migraines -agree with PCP that imitrex is a great option. Discussed with her that there is increased risk of stroke. She states thyroid function is currently well controlled -discussed any potential triggers- she notes new perfume last week which could have been a trigger -advised to continue to be attentive for other food or chemical related triggers   4) HTN: -Advised checking BP daily at home and logging results to bring into follow-up appointment with PCP and myself. -Reviewed BP meds today.  -Advised regarding healthy foods that can help lower blood pressure and provided with a list: 1) citrus foods- high in vitamins and minerals 2) salmon and other fatty fish - reduces inflammation and oxylipins 3) swiss chard (leafy green)- high level of nitrates 4) pumpkin seeds- one of the best natural sources of magnesium 5) Beans and lentils- high in fiber, magnesium, and potassium 6) Berries- high in flavonoids 7) Amaranth (whole grain, can be cooked similarly to rice and oats)- high in magnesium and fiber 8) Pistachios- even more effective at  reducing BP than other nuts 9) Carrots- high in phenolic compounds that relax blood vessels and reduce inflammation 10) Celery- contain phthalides that relax tissues of arterial walls 11) Tomatoes- can also improve cholesterol and reduce risk of heart disease 12) Broccoli- good source of magnesium, calcium, and potassium 13) Greek yogurt: high in potassium and calcium 14) Herbs and spices: Celery seed, cilantro, saffron, lemongrass, black cumin, ginseng, cinnamon, cardamom,  sweet basil, and ginger 15) Chia and flax seeds- also help to lower cholesterol and blood sugar 16) Beets- high levels of nitrates that relax blood vessels  17) spinach and bananas- high in potassium  -Provided lise of supplements that can help with hypertension:  1) magnesium: one high quality brand is Bioptemizers since it contains all 7 types of magnesium, otherwise over the counter magnesium gluconate 400mg  is a good option 2) B vitamins 3) vitamin D 4) potassium 5) CoQ10 6) L-arginine 7) Vitamin C 8) Beetroot -Educated that goal BP is 120/80. -Made goal to incorporate some of the above foods into diet.    5) Left heel plantar fascitis: Discussed resolution with E[facility]  6) Right Achiles tendinopathy: -discussed resolution with [facility]  7) Type 2 diabetes: -Discussed the benefits of intermittent fasting. Recommended starting with pushing dinner 15 minutes earlier and when this feels easy, continuing to push dinner 15 minutes earlier. Discussed that this can help her body to improve its ability to burn fat rather than glucose, improving insulin sensitivity. Recommended drinking Roobois tea in the evening to help curb appetite and for its numerous health benefits.   -discussed most recent HgbA1c of 6.2. -discussed that she is doing an incredible job with her intermittent fasting -commended eating smaller dinners.  -continue steamed vegetables -continue protein shake -discussed the importance of fiber.   8) Nausea: refilled zofran  9) Stress: Discussed stress from obtaining the custody of her grandkids due to her daughter having issues with drug abuse

## 2023-02-10 DIAGNOSIS — M4807 Spinal stenosis, lumbosacral region: Secondary | ICD-10-CM | POA: Diagnosis not present

## 2023-02-11 ENCOUNTER — Other Ambulatory Visit: Payer: Self-pay | Admitting: Family Medicine

## 2023-02-11 ENCOUNTER — Other Ambulatory Visit: Payer: Self-pay | Admitting: "Endocrinology

## 2023-02-11 ENCOUNTER — Other Ambulatory Visit: Payer: Self-pay | Admitting: Gastroenterology

## 2023-02-26 DIAGNOSIS — E782 Mixed hyperlipidemia: Secondary | ICD-10-CM | POA: Diagnosis not present

## 2023-02-26 DIAGNOSIS — E890 Postprocedural hypothyroidism: Secondary | ICD-10-CM | POA: Diagnosis not present

## 2023-02-27 LAB — LIPID PANEL
Cholesterol: 224 mg/dL — ABNORMAL HIGH (ref <200)
HDL: 38 mg/dL — ABNORMAL LOW (ref >=50)
LDL Cholesterol (Calc): 133 mg/dL — ABNORMAL HIGH
Non-HDL Cholesterol (Calc): 186 mg/dL — ABNORMAL HIGH (ref <130)
Total CHOL/HDL Ratio: 5.9 (calc) — ABNORMAL HIGH (ref <5.0)
Triglycerides: 368 mg/dL — ABNORMAL HIGH (ref <150)

## 2023-02-27 LAB — COMPREHENSIVE METABOLIC PANEL
AG Ratio: 1.8 (calc) (ref 1.0–2.5)
ALT: 20 U/L (ref 6–29)
AST: 18 U/L (ref 10–30)
Albumin: 4.5 g/dL (ref 3.6–5.1)
Alkaline phosphatase (APISO): 77 U/L (ref 31–125)
BUN: 12 mg/dL (ref 7–25)
CO2: 28 mmol/L (ref 20–32)
Calcium: 9.3 mg/dL (ref 8.6–10.2)
Chloride: 102 mmol/L (ref 98–110)
Creat: 0.81 mg/dL (ref 0.50–0.99)
Globulin: 2.5 g/dL (ref 1.9–3.7)
Glucose, Bld: 115 mg/dL (ref 65–139)
Potassium: 3.8 mmol/L (ref 3.5–5.3)
Sodium: 140 mmol/L (ref 135–146)
Total Bilirubin: 0.5 mg/dL (ref 0.2–1.2)
Total Protein: 7 g/dL (ref 6.1–8.1)

## 2023-02-27 LAB — TSH: TSH: 15.11 m[IU]/L — ABNORMAL HIGH

## 2023-02-27 LAB — T4, FREE: Free T4: 1.1 ng/dL (ref 0.8–1.8)

## 2023-03-09 ENCOUNTER — Ambulatory Visit: Payer: BC Managed Care – PPO | Admitting: "Endocrinology

## 2023-03-09 ENCOUNTER — Encounter: Payer: Self-pay | Admitting: "Endocrinology

## 2023-03-09 VITALS — BP 140/92 | HR 68 | Ht 67.0 in | Wt 259.8 lb

## 2023-03-09 DIAGNOSIS — E890 Postprocedural hypothyroidism: Secondary | ICD-10-CM | POA: Diagnosis not present

## 2023-03-09 DIAGNOSIS — E782 Mixed hyperlipidemia: Secondary | ICD-10-CM | POA: Diagnosis not present

## 2023-03-09 DIAGNOSIS — Z91199 Patient's noncompliance with other medical treatment and regimen due to unspecified reason: Secondary | ICD-10-CM | POA: Insufficient documentation

## 2023-03-09 DIAGNOSIS — I10 Essential (primary) hypertension: Secondary | ICD-10-CM

## 2023-03-09 DIAGNOSIS — R7303 Prediabetes: Secondary | ICD-10-CM

## 2023-03-09 LAB — POCT GLYCOSYLATED HEMOGLOBIN (HGB A1C)

## 2023-03-09 MED ORDER — LEVOTHYROXINE SODIUM 25 MCG PO TABS: 25.0000 ug | ORAL_TABLET | Freq: Every day | ORAL | 1 refills | Status: DC

## 2023-03-09 MED ORDER — ROSUVASTATIN CALCIUM 20 MG PO TABS: 20.0000 mg | ORAL_TABLET | Freq: Every evening | ORAL | 1 refills | Status: DC

## 2023-03-09 NOTE — Patient Instructions (Signed)

## 2023-03-09 NOTE — Progress Notes (Signed)
03/09/2023    Endocrinology follow-up note   Subjective:    Patient ID: Teresa Vincent, female    DOB: [birth year], PCP [name], MD  Past Medical History:  Diagnosis Date   Abnormal Pap smear of cervix    ANXIETY 08/30/2007   Asthma    Chronic back pain    Claustrophobia    pt requests Versed before surgery.   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     Past Surgical History:  Procedure Laterality Date   ABDOMINAL EXPOSURE N/A 05/29/2017   Procedure: ABDOMINAL EXPOSURE;  Surgeon: Chuck Hint, MD;  Location: [facility] OR;  Service: Vascular;  Laterality: N/A;   ABDOMINAL HYSTERECTOMY     ANTERIOR LUMBAR FUSION N/A 05/29/2017   Procedure: Anterior Lumbar Interbody Fusion  - Lumbar five sacral one;  Surgeon: Tia Alert, MD;  Location: [facility] OR;  Service: Neurosurgery;  Laterality: N/A;   BIOPSY  02/17/2020   Procedure: BIOPSY;  Surgeon: Lanelle Bal, DO;  Location: [facility];  Service: Endoscopy;;  gastric   CARPAL TUNNEL RELEASE Bilateral    CHOLECYSTECTOMY     CHOLECYSTECTOMY N/A 12/29/2016   Procedure: LAPAROSCOPIC CHOLECYSTECTOMY;  Surgeon: [name], MD;  Location: [facility];  Service: General;  Laterality: N/A;   COLONOSCOPY WITH PROPOFOL N/A 02/17/2020   non-bleeding internal hemorrhoids, otherwise normal.   CYSTOSCOPY/URETEROSCOPY/HOLMIUM LASER/STENT PLACEMENT Right 06/25/2019   Procedure: CYSTOSCOPY WITH RIGHT RETROGRADE PYELOGRAM/RIGHT URETEROSCOPY/HOLMIUM LASER APPLICATION RIGHT URETERAL CALCULUS/RIGHT URETERAL STENT PLACEMENT;  Surgeon: Malen Gauze, MD;   Location: [facility];  Service: Urology;  Laterality: Right;   ESOPHAGOGASTRODUODENOSCOPY (EGD) WITH PROPOFOL N/A 02/17/2020   Procedure: ESOPHAGOGASTRODUODENOSCOPY (EGD) WITH PROPOFOL;  Surgeon: Lanelle Bal, DO;  Location: [facility];  Service: Endoscopy;  Laterality: N/A;   EXTRACORPOREAL SHOCK WAVE LITHOTRIPSY Right 04/10/2019   Procedure: EXTRACORPOREAL SHOCK WAVE LITHOTRIPSY (ESWL);  Surgeon: Noel Christmas, MD;  Location: [facility];  Service: Urology;  Laterality: Right;   LAMINECTOMY WITH POSTERIOR LATERAL ARTHRODESIS LEVEL 1 N/A 02/28/2018   Procedure: Laminectomy and Foraminotomy Lumbar Five-Sacral One, posterolateral fusion and fixation;  Surgeon: Tia Alert, MD;  Location: [facility] OR;  Service: Neurosurgery;  Laterality: N/A;  posterior   LUMBAR EPIDURAL INJECTION     STERIOD INJECTION N/A 01/10/2017   Procedure: MINOR EXPAREL INJECTION;  Surgeon: [name], MD;  Location: [facility];  Service: General;  Laterality: N/A;   TOTAL ABDOMINAL HYSTERECTOMY W/ BILATERAL SALPINGOOPHORECTOMY N/A    TUBAL LIGATION  2003   Social History   Social History   Socioeconomic History   Marital status: Married    Spouse name: Not on file   Number of children: Not on file   Years of education: Not on file   Highest education level: Not on file  Occupational History   Occupation: Doesn't work outside the home  Tobacco Use   Smoking status: Former    Current packs/day: 0.00    Average packs/day: 1 pack/day for 26.0 years (26.0  ttl pk-yrs)    Types: Cigarettes    Start date: 01/08/1995    Quit date: 01/07/2021    Years since quitting: 2.1   Smokeless tobacco: Former  Building services engineer status: Every Day   Substances: Nicotine, Flavoring  Substance and Sexual Activity   Alcohol use: Yes    Alcohol/week: 0.0 standard drinks of alcohol    Comment: occasional   Drug use: Not Currently    Types: Marijuana    Comment: occasional   Sexual activity: Not  Currently    Birth control/protection: Surgical  Other Topics Concern   Not on file  Social History Narrative   Not on file   Social Drivers of Health   Financial Resource Strain: Not on file  Food Insecurity: Not on file  Transportation Needs: Not on file  Physical Activity: Not on file  Stress: Not on file  Social Connections: Not on file  Intimate Partner Violence: Not on file     Current Outpatient Medications  Medication Instructions   albuterol (VENTOLIN HFA) 108 (90 Base) MCG/ACT inhaler 1-2 puffs, Inhalation, Every 4 hours PRN   ALPRAZolam (XANAX) 0.5 MG tablet TAKE 1/2 TO 1 (ONE-HALF TO ONE) TABLET BY MOUTH TWICE DAILY AS NEEDED FOR ANXIETY   Cholecalciferol 50 MCG (2000 UT) CAPS 1 capsule, Oral, Daily   levothyroxine (SYNTHROID) 200 mcg, Oral, Daily   levothyroxine (SYNTHROID) 25 mcg, Oral, Daily before breakfast   lisinopril (ZESTRIL) 20 mg, Daily   metoprolol succinate (TOPROL-XL) 50 MG 24 hr tablet TAKE 1 TABLET BY MOUTH ONCE DAILY WITH OR IMMEDIATELY FOLLOWING A MEAL   omeprazole (PRILOSEC) 40 MG capsule TAKE 1 CAPSULE BY MOUTH TWICE DAILY BEFORE A MEAL   ondansetron (ZOFRAN-ODT) 4 mg, Oral, Every 8 hours PRN   oxyCODONE-acetaminophen (PERCOCET) 5-325 MG tablet 1 tablet, Oral, 2 times daily PRN   rosuvastatin (CRESTOR) 20 mg, Oral, Nightly   SUMAtriptan (IMITREX) 100 MG tablet 1 tablet at the first sign of migraine. Additional dose in 2 hours if headache persists or recurs.     HPI  [name] is a 42 year old female patient with medical history as above.  She is being seen in follow-up for hypothyroidism related to remote past thyroid ablation with I-131 for Graves' disease.  She also has severe hyperlipidemia, prediabetes, hypertension. 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 She was treated with various doses of levothyroxine over the years .  She is currently on levothyroxine 200  mcg p.o. daily before breakfast.  Her previsit labs are consistent with under replacement.  She reports compliance and consistency with her thyroid hormone, however significant inconsistency in taking her Crestor and her blood pressure medication.   -She is planning to adhere to lifestyle medicine nutrition previously discussed with her admittedly has not been optimized since last visit.    She was  recently  diagnosed with CVA for which she was initiated on Plavix.   She has prediabetes with A1c of 6.1%.   She did not stay on metformin.     Objective:     HEENT: She has prominent eyes, however no significant lid lag.     03/09/2023    8:43 AM 02/02/2023    9:41 AM 12/05/2022   12:20 PM  Vitals with BMI  Height 5\' 7"  5\' 7"  5\' 7"   Weight 259 lbs 13 oz 257 lbs 258 lbs  BMI 40.68 40.24 40.4  Systolic  140 145 140  Diastolic 92 91 80  Pulse 68 70 61     Recent Results (from the past 2160 hours)  Lipid panel     Status: Abnormal   Collection Time: 02/26/23  7:17 AM  Result Value Ref Range   Cholesterol 224 (H) <200 mg/dL   HDL 38 (L) > OR = 50 mg/dL   Triglycerides 161 (H) <150 mg/dL    Comment: . If a non-fasting specimen was collected, consider repeat triglyceride testing on a fasting specimen if clinically indicated.  Perry Mount et al. J. of Clin. Lipidol. 2015;9:129-169. Marland Kitchen    LDL Cholesterol (Calc) 133 (H) mg/dL (calc)    Comment: Reference range: <100 . Desirable range <100 mg/dL for primary prevention;   <70 mg/dL for patients with CHD or diabetic patients  with > or = 2 CHD risk factors. Marland Kitchen LDL-C is now calculated using the Martin-Hopkins  calculation, which is a validated novel method providing  better accuracy than the Friedewald equation in the  estimation of LDL-C.  Horald Pollen et al. Lenox Ahr. 0960;454(09): 2061-2068  (http://education.[facility].com/faq/FAQ164)    Total CHOL/HDL Ratio 5.9 (H) <5.0 (calc)   Non-HDL Cholesterol (Calc) 186 (H) <130 mg/dL (calc)     Comment: For patients with diabetes plus 1 major ASCVD risk  factor, treating to a non-HDL-C goal of <100 mg/dL  (LDL-C of <81 mg/dL) is considered a therapeutic  option.   TSH     Status: Abnormal   Collection Time: 02/26/23  7:17 AM  Result Value Ref Range   TSH 15.11 (H) mIU/L    Comment:           Reference Range .           > or = 20 Years  0.40-4.50 .                Pregnancy Ranges           First trimester    0.26-2.66           Second trimester   0.55-2.73           Third trimester    0.43-2.91   T4, free     Status: None   Collection Time: 02/26/23  7:17 AM  Result Value Ref Range   Free T4 1.1 0.8 - 1.8 ng/dL  Comprehensive metabolic panel     Status: None   Collection Time: 02/26/23  7:17 AM  Result Value Ref Range   Glucose, Bld 115 65 - 139 mg/dL    Comment: .        Non-fasting reference interval .    BUN 12 7 - 25 mg/dL   Creat 1.91 4.78 - 2.95 mg/dL   BUN/Creatinine Ratio SEE NOTE: 6 - 22 (calc)    Comment:    Not Reported: BUN and Creatinine are within    reference range. .    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   She has some questionable compliance, however she will benefit from slight increase in her levothyroxine.  I discussed and added 25 mcg of levothyroxine in addition to her existing 200 mcg to make a total daily dose of 225 mcg p.o. daily before breakfast.     - We discussed about the correct intake of her thyroid hormone, on empty stomach at  fasting, with water, separated by at least 30 minutes from breakfast and other medications,  and separated by more than 4 hours from calcium, iron, multivitamins, acid reflux medications (PPIs). -Patient is made aware of the fact that thyroid hormone replacement is needed for life, dose to be adjusted by periodic monitoring of thyroid function tests.    2. Hyperlipidemia-  -She has disengaged from lifestyle medicine which led to loss of control of her lipid profile, returning with LDL of 133, significant hypertriglyceridemia and high total cholesterol.  She is urged to be consistent with her Crestor, discussed and increase to 20 mg p.o. nightly.  She is planning to resume her previously effective dietary plan with whole food plant-based diet.  She is encouraged to do so.   In light of her metabolic dysfunction indicated by prediabetes, hypothermia, obesity she remains to benefit from lifestyle meds.  - she acknowledges that there is a room for improvement in her food and drink choices. - Suggestion is made for her to avoid simple carbohydrates  from her diet including Cakes, Sweet Desserts, Ice Cream, Soda (diet and regular), Sweet Tea, Candies, Chips, Cookies, Store Bought Juices, Alcohol , Artificial Sweeteners,  Coffee Creamer, and "Sugar-free" Products, Lemonade. This will help patient to have more stable blood glucose profile and potentially avoid unintended weight gain.  The following Lifestyle Medicine recommendations according to American College of Lifestyle Medicine  [facility]) were discussed and and offered to patient and she  agrees to start the journey:  A. Whole Foods, Plant-Based Nutrition comprising of fruits and vegetables, plant-based proteins, whole-grain carbohydrates was discussed in detail with the patient.   A list for source of those nutrients were also provided to the patient.  Patient will use only water or unsweetened tea for hydration. B.  The need to stay away from risky  substances including alcohol, smoking; obtaining 7 to 9 hours of restorative sleep, at least 150 minutes of moderate intensity exercise weekly, the importance of healthy social connections,  and stress management techniques were discussed. C.  A full color page of  Calorie density of various food groups per pound showing examples of each food groups was provided to the patient.    3) prediabetes-her point-of-care A1c today is 6.4% increasing from 6.1%.  She still does not want to start metformin.  The above discussed Medicine nutrition will help her reverse if she engages and adheres.     4) morbid obesity: This patient clearly needs intervention.  She favors lifestyle medicine for now.  Considering the fact that her A1c is increasing and her risk for type 2 diabetes is significantly high, if her engagement is suboptimal, she will be offered pharmaceutical options next visit.  5) hypertension: Uncontrolled.  She is currently on metoprolol 50 mg XL p.o. daily.  She is also reporting that she has lisinopril 20 mg p.o. at home, however has not been using it.  Her blood pressure is elevated  today at 140/92.  She is encouraged and urged to start lisinopril 20 mg p.o. daily in addition to her metoprolol.  - I advised patient to maintain close follow up with her PMD for primary care needs.    I spent  26 minutes in the care of the patient today including review of labs from Thyroid Function, CMP, and other relevant labs ; imaging/biopsy records (current and previous including abstractions from other facilities); face-to-face time discussing  her lab results and symptoms, medications doses, her options of short and long term treatment based on the latest standards of care / guidelines;   and documenting the encounter.  Elika Kampf Kia  participated in the discussions, expressed understanding, and voiced agreement with the above plans.  All questions were answered to her satisfaction. she is encouraged  to contact clinic should she have any questions or concerns prior to her return visit.    Marquis Lunch, MD Phone: [phone]  Fax: [phone]  -  This note was partially dictated with voice recognition software. Similar sounding words can be transcribed inadequately or may not  be corrected upon review.  06/11/2018, 2:32 PM

## 2023-03-12 DIAGNOSIS — M4807 Spinal stenosis, lumbosacral region: Secondary | ICD-10-CM | POA: Diagnosis not present

## 2023-03-20 ENCOUNTER — Other Ambulatory Visit: Payer: Self-pay | Admitting: Physical Medicine and Rehabilitation

## 2023-03-20 MED ORDER — OXYCODONE-ACETAMINOPHEN 5-325 MG PO TABS: 1.0000 | ORAL_TABLET | Freq: Two times a day (BID) | ORAL | 0 refills | Status: DC | PRN

## 2023-03-20 NOTE — Telephone Encounter (Signed)
 PMP was Reviewed.  Oxycodone e-scribed to pharmacy.  Teresa Vincent is aware of the above via My-Chart message

## 2023-03-23 ENCOUNTER — Other Ambulatory Visit: Payer: Self-pay | Admitting: Family Medicine

## 2023-03-30 ENCOUNTER — Other Ambulatory Visit: Payer: Self-pay | Admitting: Family Medicine

## 2023-04-01 DIAGNOSIS — R030 Elevated blood-pressure reading, without diagnosis of hypertension: Secondary | ICD-10-CM | POA: Diagnosis not present

## 2023-04-01 DIAGNOSIS — M545 Low back pain, unspecified: Secondary | ICD-10-CM | POA: Diagnosis not present

## 2023-04-01 DIAGNOSIS — Z6841 Body Mass Index (BMI) 40.0 and over, adult: Secondary | ICD-10-CM | POA: Diagnosis not present

## 2023-04-03 ENCOUNTER — Encounter: Payer: Self-pay | Admitting: Physical Medicine and Rehabilitation

## 2023-04-03 ENCOUNTER — Ambulatory Visit: Payer: BC Managed Care – PPO | Admitting: Family Medicine

## 2023-04-03 ENCOUNTER — Encounter
Payer: BC Managed Care – PPO | Attending: Physical Medicine and Rehabilitation | Admitting: Physical Medicine and Rehabilitation

## 2023-04-03 VITALS — BP 113/72 | HR 72 | Wt 265.6 lb

## 2023-04-03 DIAGNOSIS — M48062 Spinal stenosis, lumbar region with neurogenic claudication: Secondary | ICD-10-CM | POA: Diagnosis not present

## 2023-04-03 DIAGNOSIS — M7661 Achilles tendinitis, right leg: Secondary | ICD-10-CM | POA: Diagnosis not present

## 2023-04-03 DIAGNOSIS — G4733 Obstructive sleep apnea (adult) (pediatric): Secondary | ICD-10-CM | POA: Insufficient documentation

## 2023-04-03 MED ORDER — OXYCODONE-ACETAMINOPHEN 5-325 MG PO TABS: 1.0000 | ORAL_TABLET | Freq: Two times a day (BID) | ORAL | 0 refills | Status: DC | PRN

## 2023-04-03 MED ORDER — TOPIRAMATE 25 MG PO TABS: 25.0000 mg | ORAL_TABLET | Freq: Every evening | ORAL | 3 refills | Status: DC

## 2023-04-03 NOTE — Progress Notes (Signed)
 Subjective:    Patient ID: Teresa Vincent, female    DOB: 05/28/1980, 43 y.o.   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 -pain has been horrible -she has been feeling intermittent sensations for a few months; -feels like a tingling, burning, cooling sensation in lumbar area -has been getting worse she thinks because she has been standing -she thinks something is pinched or a screw was dislodged -she has been super busy at work- has not had a day of since August 15th since she picked up a 2nd job -she needs to go see her mom tomorrow -she was also doing soup kitchen with her church -this is her first weekend and she plans on relaxing -has a tens unit and it does help but if the pain is through the roof then it doesn't help -she tries to use the tens three times per day Teresa Vincent is a 43 year old woman who presents to establish care for left sided lower back pain that radiates into her left leg.  -she felt no benefit from going to the chiropractor.  -pain has been severe recently -twice a month she is working 7 days a week and she talked with her boss's boss about slowing down a little and he is seeing if this possible.  -she takes care of the garden center at [employer] -Pain has been present for years -She has had two back surgeries.  -She has had 7 injections but got no relief from these.  -She has gone back to Dr. Joshua who did her surgery.  -She was on oxycodone  5mg  that she broke in half to 2.5s which helped. This helped her to get through the day. She has been on it for years. She has been following with Fidela.  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  -feels better when laying down, but also painful while sitting -today has been a bad day -she uses her electric blanket and heating pad -she has been working at [employer]- she is  the cold at 5am in the morning and this is when the pain is worse.  -the colder it is the worse it is hurting -she got a year membership with chiropractor and has been there for a month. She has not found benefit yet.  2) left knee pain: -has had steroid injection with benefit.  -this has continued to benefit.   3) left foot pain -prescribed meloxicam  by Dr. Bluford -seeing a doctor today and will told whether she needs surgical pain. She wants whatever is necessary to stop the pain -her arch stays swollen -she was supposed to do PT last month but was unable to get in until 6/6 -still present  4) Anxiety -she gets anxious about driving and she gets palpitations -she has had a prior stroke and has felt this anxiety since this time.   5) migraines -has at baseline but last week was very severe, particularly Wednesday -she considered going to the ED on Wednesday due to her pain but pain improved without new intervention by Thursday -she saw her PCP this morning and was recommended to take Imitrex  -she does have a history of remote stroke which was thought to be related to her thyroid  disease -she has been doing ok now -her thyroid  levels had gone way out of control.  -she received a shot in the ED  for her pain  -pain has been stable -she was hopeful that the more she lost weight her pain would improve  6) Loss of cervical lordosis: -working with chiropractor on improving this and has already seen improvements -joinged a gym  7) morbid obesity -she is put on a strict diet with her endocrinologist.  -lost 22 lbs in a month. She went from 262 to 244.2 today.  -husband is doing the same diet with her and he lost 34 lbs.  -they do portion control sizes and they eat meat once per week -they went vegan with a lot of things, cream cheese and cheese -they have a cheat day once per week.   8) Left foot pain Resolved with ECS[facility]  9) Achilles tendinosis, right -she is considering  surgery -she is scheduled with me for [facility]  10) Type 2 diabetes -every now and then she will have a cheat day -she thinks her endocrinologist will be upset with her that her HgbA1c improves -she has lost 36 lbs -she has stopped soft drinks -she shows be her improvements in her lipid panel -discussed that se loves greens, hummus -her last bite is often 5:30-6;:30, sometimes 7pm if she is hungry.   11) Right foot pain: -much better after shockwave therapy -continues to be improed  12) Stress: -regarding getting custody of her grandkids as her eldest daughter has a drug addiction  Pain Inventory Average Pain 8 Pain Right Now 8 My pain is constant, sharp, burning, dull, stabbing, and aching  In the last 24 hours, has pain interfered with the following? General activity 10 Relation with others 10 Enjoyment of life 10 What TIME of day is your pain at its worst? morning , daytime, evening, and night Sleep (in general) Fair Pain is worse with: walking, bending, sitting, inactivity, standing, and some activites Pain improves with: medication  Relief from Meds: 5     Family History  Problem Relation Age of Onset   Hypertension Mother    Thyroid  disease Neg Hx    Social History   Socioeconomic History   Marital status: Married    Spouse name: Not on file   Number of children: Not on file   Years of education: Not on file   Highest education level: Not on file  Occupational History   Occupation: Doesn't work outside the home  Tobacco Use   Smoking status: Former    Current packs/day: 0.00    Average packs/day: 1 pack/day for 26.0 years (26.0 ttl pk-yrs)    Types: Cigarettes    Start date: 01/08/1995    Quit date: 01/07/2021    Years since quitting: 2.2   Smokeless tobacco: Former  Building Services Engineer status: Every Day   Substances: Nicotine , Flavoring  Substance and Sexual Activity   Alcohol use: Yes    Alcohol/week: 0.0 standard drinks of alcohol    Comment:  occasional   Drug use: Not Currently    Types: Marijuana    Comment: occasional   Sexual activity: Not Currently    Birth control/protection: Surgical  Other Topics Concern   Not on file  Social History Narrative   Not on file   Social Drivers of Health   Financial Resource Strain: Not on file  Food Insecurity: Not on file  Transportation Needs: Not on file  Physical Activity: Not on file  Stress: Not on file  Social Connections: Not on file   Past Surgical History:  Procedure Laterality Date   ABDOMINAL EXPOSURE  N/A 05/29/2017   Procedure: ABDOMINAL EXPOSURE;  Surgeon: Eliza Lonni RAMAN, MD;  Location: [facility] OR;  Service: Vascular;  Laterality: N/A;   ABDOMINAL HYSTERECTOMY     ANTERIOR LUMBAR FUSION N/A 05/29/2017   Procedure: Anterior Lumbar Interbody Fusion  - Lumbar five sacral one;  Surgeon: Joshua Alm RAMAN, MD;  Location: [facility] OR;  Service: Neurosurgery;  Laterality: N/A;   BIOPSY  02/17/2020   Procedure: BIOPSY;  Surgeon: Cindie Carlin POUR, DO;  Location: [facility];  Service: Endoscopy;;  gastric   CARPAL TUNNEL RELEASE Bilateral    CHOLECYSTECTOMY     CHOLECYSTECTOMY N/A 12/29/2016   Procedure: LAPAROSCOPIC CHOLECYSTECTOMY;  Surgeon: Kallie Manuelita BROCKS, MD;  Location: [facility] ORS;  Service: General;  Laterality: N/A;   COLONOSCOPY WITH PROPOFOL  N/A 02/17/2020   non-bleeding internal hemorrhoids, otherwise normal.   CYSTOSCOPY/URETEROSCOPY/HOLMIUM LASER/STENT PLACEMENT Right 06/25/2019   Procedure: CYSTOSCOPY WITH RIGHT RETROGRADE PYELOGRAM/RIGHT URETEROSCOPY/HOLMIUM LASER APPLICATION RIGHT URETERAL CALCULUS/RIGHT URETERAL STENT PLACEMENT;  Surgeon: Sherrilee Belvie CROME, MD;  Location: [facility];  Service: Urology;  Laterality: Right;   ESOPHAGOGASTRODUODENOSCOPY (EGD) WITH PROPOFOL  N/A 02/17/2020   Procedure: ESOPHAGOGASTRODUODENOSCOPY (EGD) WITH PROPOFOL ;  Surgeon: Cindie Carlin POUR, DO;  Location: [facility];  Service: Endoscopy;  Laterality: N/A;   EXTRACORPOREAL SHOCK  WAVE LITHOTRIPSY Right 04/10/2019   Procedure: EXTRACORPOREAL SHOCK WAVE LITHOTRIPSY (ESWL);  Surgeon: Elisabeth Valli BIRCH, MD;  Location: [facility];  Service: Urology;  Laterality: Right;   LAMINECTOMY WITH POSTERIOR LATERAL ARTHRODESIS LEVEL 1 N/A 02/28/2018   Procedure: Laminectomy and Foraminotomy Lumbar Five-Sacral One, posterolateral fusion and fixation;  Surgeon: Joshua Alm RAMAN, MD;  Location: [facility] OR;  Service: Neurosurgery;  Laterality: N/A;  posterior   LUMBAR EPIDURAL INJECTION     STERIOD INJECTION N/A 01/10/2017   Procedure: MINOR EXPAREL  INJECTION;  Surgeon: Kallie Manuelita BROCKS, MD;  Location: [facility];  Service: General;  Laterality: N/A;   TOTAL ABDOMINAL HYSTERECTOMY W/ BILATERAL SALPINGOOPHORECTOMY N/A    TUBAL LIGATION  2003   Past Medical History:  Diagnosis Date   Abnormal Pap smear of cervix    ANXIETY 08/30/2007   Asthma    Chronic back pain    Claustrophobia    pt requests Versed  before surgery.   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   BP 113/72   Pulse 72   Wt 265 lb 9.6 oz (120.5 kg)   LMP 02/25/2016   SpO2 94%   BMI 41.60 kg/m   Opioid Risk Score:   Fall Risk Score:  `1  Depression screen PHQ 2/9     04/03/2023    2:16 PM 12/05/2022   12:24 PM 10/02/2022    2:55 PM 04/07/2022    1:39 PM 03/21/2022    2:19 PM 03/14/2022   10:20 AM 02/28/2022    1:59 PM  Depression screen PHQ 2/9  Decreased Interest 0 0 0 0 0 0 0  Down, Depressed, Hopeless 0 0 0 0 0 0 0  PHQ - 2 Score 0 0 0 0 0 0  0  Altered sleeping    2     Tired, decreased energy    0     Change in appetite    0      Feeling bad or failure about yourself     0     Trouble concentrating    0     Moving slowly or fidgety/restless    0     Suicidal thoughts    0     PHQ-9 Score    2     Difficult doing work/chores    Somewhat difficult       Review of Systems  Constitutional:  Positive for unexpected weight change.  HENT: Negative.    Eyes: Negative.   Respiratory: Negative.    Cardiovascular:  Positive for leg swelling.  Gastrointestinal: Negative.   Endocrine: Negative.   Genitourinary: Negative.   Musculoskeletal:  Positive for arthralgias and back pain.  Skin: Negative.   Allergic/Immunologic: Negative.   Neurological:  Positive for numbness.       Tingling  Hematological: Negative.   Psychiatric/Behavioral:  The patient is nervous/anxious.   All other systems reviewed and are negative.      Objective:   Physical Exam PRIOR EXAM: Gen: no distress, normal appearing, weight 265lbs, BMI 40.41, 134/84 HEENT: oral mucosa pink and moist, NCAT, exopthalmos Gen: no distress, normal appearing HEENT: oral mucosa pink and moist, NCAT Cardio: Reg rate Gen: no distress, normal appearing HEENT: oral mucosa pink and moist, NCAT Cardio: Reg rate Chest: normal effort, normal rate of breathing Abd: soft, non-distended Ext: no edema Psych: pleasant, normal affect Musculoskeletal: Normal ambulation, right achiles tendinopathy, left heel spur, negative slump test    Assessment & Plan:  Teresa Vincent is a 43 year old woman who presents to establish care for lumbar spinal stenosis/neuropathy  1) Lumbar spinal stenosis with lumbar neurogenic claudication.  -Discussed benefits of exercise in reducing pain. refilled percocet -failed lidocaine  patches -continue topamax  25mg  HS -continue light restriction on bending and twisting -discussed that pain has acutely worsened -discussed getting an XR -Discussed following foods that may reduce pain: 1) Ginger (especially studied for arthritis)- reduce  leukotriene production to decrease inflammation 2) Blueberries- high in phytonutrients that decrease inflammation 3) Salmon- marine omega-3s reduce joint swelling and pain 4) Pumpkin seeds- reduce inflammation 5) dark chocolate- reduces inflammation 6) turmeric- reduces inflammation 7) tart cherries - reduce pain and stiffness 8) extra virgin olive oil - its compound olecanthal helps to block prostaglandins  9) chili peppers- can be eaten or applied topically via capsaicin 10) mint- helpful for headache, muscle aches, joint pain, and itching 11) garlic- reduces inflammation  Link to further information on diet for chronic pain: [email or web address].[email or web address]   -discussed that her stress has not worsen her pain Continue heat therapy -discussed negative response to gabapentin  in the past.  Prescribing Home Zynex NexWave Stimulator Device and supplies as needed. IFC, NMES and TENS medically necessary Treatment Rx: Daily @ 30-40 minutes per treatment PRN. Zynex NexWave only, no substitutions. Treatment Goals: 1) To reduce and/or eliminate pain 2) To improve functional capacity and Activities of daily living 3) To reduce or prevent the need for oral medications 4) To improve circulation in the injured region 5) To decrease or prevent muscle spasm and muscle atrophy 6) To provide a self-management tool to the patient The patient has not sufficiently improved with conservative care. Numerous studies indexed by Medline and PubMed.gov have shown Neuromuscular, Interferential, and TENS stimulators to reduce pain,  improve function, and reduce medication use in injured patients. Continued use of this evidence based, safe, drug free treatment is both reasonable and medically necessary at this time.    Continue UDS monitoring -Discussed current symptoms -She is allergic to bupivicaine, she has tried surgery, injections, medication. Oxycodone  5mg   helped. She has never failed a drug screen. She works at [employer]. Increase oxycodone  to 5mg  daily.  -Will obtain UDS and pain contract today. If results negative, can prescribe Percocet 5mg -325 monthly PRN.  Lumbar MRI reviewed as follows:  IMPRESSION: 1. No impingement identified in the lumbar spine. 2. 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 3. Degenerative disc disease at T11-12, L2- 3, L3-4, and L5-S1. 4. Cholelithiasis.   2) Obesity: weight 265 lbs, BMI 40.10 -Educated regarding health benefits of weight loss- for pain, general health, chronic disease prevention, immune health, mental health.  -continue black coffee -Educated regarding health benefits of weight loss- for pain, general health, chronic disease prevention, immune health, mental health.  -Will monitor weight every visit.  -Consider Roobois tea daily.  -Discussed the benefits of intermittent fasting. -Discussed foods that can assist in weight loss: 1) leafy greens- high in fiber and nutrients 2) dark chocolate- improves metabolism (if prefer sweetened, best to sweeten with honey instead of sugar).  3) cruciferous vegetables- high in fiber and protein 4) full fat yogurt: high in healthy fat, protein, calcium , and probiotics 5) apples- high in a variety of phytochemicals 6) nuts- high in fiber and protein that increase feelings of fullness 7) grapefruit: rich in nutrients, antioxidants, and fiber (not to be taken with anticoagulation) 8) beans- high in protein and fiber 9) salmon- has high quality protein and healthy fats 10) green tea- rich in polyphenols 11) eggs- rich in choline and vitamin D  12) tuna- high protein, boosts metabolism 13) avocado- decreases visceral abdominal fat 14) chicken (pasture raised): high in protein and iron 15) blueberries- reduce abdominal fat and cholesterol 16) whole grains- decreases calories retained during digestion, speeds metabolism 17) chia seeds-  curb appetite 18) chilies- increases fat metabolism  -Discussed supplements that can be used:  1) Metatrim 400mg  BID 30 minutes before breakfast and dinner  2) Sphaeranthus indicus and Garcinia mangostana (combinations of these and #1 can be found in capsicum and zychrome  3) green coffee bean extract 400mg  twice per day or Irvingia (african mango) 150 to 300mg  twice per day.   3) migraines -agree with PCP that imitrex  is a great option. Discussed with her that there is increased risk of stroke. She states thyroid  function is currently well controlled -discussed any potential triggers- she notes new perfume last week which could have been a trigger -advised to continue to be attentive for other food or chemical related triggers   4) HTN: -Advised checking BP daily at home and logging results to bring into follow-up appointment with PCP and myself. -Reviewed BP meds today.  -Advised regarding healthy foods that can help lower blood pressure and provided with a list: 1) citrus foods- high in vitamins and minerals 2) salmon and other fatty fish - reduces inflammation and oxylipins 3) swiss chard (leafy green)- high level of nitrates 4) pumpkin seeds- one of the best natural sources of magnesium  5) Beans and lentils- high in fiber, magnesium , and potassium 6) Berries- high in flavonoids 7) Amaranth (whole grain, can be cooked similarly to rice and oats)- high in magnesium  and fiber 8) Pistachios- even more effective at reducing BP than other  nuts 9) Carrots- high in phenolic compounds that relax blood vessels and reduce inflammation 10) Celery- contain phthalides that relax tissues of arterial walls 11) Tomatoes- can also improve cholesterol and reduce risk of heart disease 12) Broccoli- good source of magnesium , calcium , and potassium 13) Greek yogurt: high in potassium and calcium  14) Herbs and spices: Celery seed, cilantro, saffron, lemongrass, black cumin, ginseng, cinnamon, cardamom,  sweet basil, and ginger 15) Chia and flax seeds- also help to lower cholesterol and blood sugar 16) Beets- high levels of nitrates that relax blood vessels  17) spinach and bananas- high in potassium  -Provided lise of supplements that can help with hypertension:  1) magnesium : one high quality brand is Bioptemizers since it contains all 7 types of magnesium , otherwise over the counter magnesium  gluconate 400mg  is a good option 2) B vitamins 3) vitamin D  4) potassium 5) CoQ10 6) L-arginine 7) Vitamin C 8) Beetroot -Educated that goal BP is 120/80. -Made goal to incorporate some of the above foods into diet.    5) Left heel plantar fascitis: Discussed resolution with ECSWT  6) Right Achiles tendinopathy: -discussed resolution with ECSWT -discussed continued improvement  7) Type 2 diabetes: -Discussed the benefits of intermittent fasting. Recommended starting with pushing dinner 15 minutes earlier and when this feels easy, continuing to push dinner 15 minutes earlier. Discussed that this can help her body to improve its ability to burn fat rather than glucose, improving insulin sensitivity. Recommended drinking Roobois tea in the evening to help curb appetite and for its numerous health benefits.   -discussed most recent HgbA1c of 6.2. -discussed that she is doing an incredible job with her intermittent fasting -commended eating smaller dinners.  -continue steamed vegetables -continue protein shake -discussed the importance of fiber.   8) Nausea: refilled zofran   9) Stress: Discussed stress from obtaining the custody of her grandkids due to her daughter having issues with drug abuse  10) Sleep apnea: continue CPAP

## 2023-04-03 NOTE — Patient Instructions (Signed)
 Foods that may reduce pain: 1) Ginger (especially studied for arthritis)- reduce leukotriene production to decrease inflammation 2) Blueberries- high in phytonutrients that decrease inflammation 3) Salmon- marine omega-3s reduce joint swelling and pain 4) Pumpkin seeds- reduce inflammation 5) dark chocolate- reduces inflammation 6) turmeric- reduces inflammation 7) tart cherries - reduce pain and stiffness 8) extra virgin olive oil - its compound olecanthal helps to block prostaglandins  9) chili peppers- can be eaten or applied topically via capsaicin 10) mint- helpful for headache, muscle aches, joint pain, and itching 11) garlic- reduces inflammation  Link to further information on diet for chronic pain: [email or web address].[email or web address]

## 2023-04-04 ENCOUNTER — Ambulatory Visit (HOSPITAL_COMMUNITY)
Admission: RE | Admit: 2023-04-04 | Discharge: 2023-04-04 | Disposition: A | Discharge status: Home / Self Care | Payer: BC Managed Care – PPO | Source: Ambulatory Visit | Attending: Family Medicine | Admitting: Family Medicine

## 2023-04-04 ENCOUNTER — Ambulatory Visit: Payer: BC Managed Care – PPO | Admitting: Family Medicine

## 2023-04-04 VITALS — BP 136/81 | HR 63 | Temp 97.2°F | Ht 67.0 in | Wt 266.0 lb

## 2023-04-04 DIAGNOSIS — Z981 Arthrodesis status: Secondary | ICD-10-CM | POA: Diagnosis not present

## 2023-04-04 DIAGNOSIS — M545 Low back pain, unspecified: Secondary | ICD-10-CM | POA: Insufficient documentation

## 2023-04-04 DIAGNOSIS — G8929 Other chronic pain: Secondary | ICD-10-CM | POA: Insufficient documentation

## 2023-04-04 DIAGNOSIS — M47816 Spondylosis without myelopathy or radiculopathy, lumbar region: Secondary | ICD-10-CM | POA: Diagnosis not present

## 2023-04-04 NOTE — Progress Notes (Signed)
 Subjective:  Patient ID: Teresa Vincent, female    DOB: 30-Mar-1980  Age: 43 y.o. MRN: [id number]  CC:   Chief Complaint  Patient presents with   Back Pain    Hx of back surgery 2019 - symptoms of tingling, burning in low- mid back - when laying on L side has pain in right calf and leg 4 to  5months now worsened    HPI:  43 year old female presents for evaluation of the above.  Patient has chronic pain and chronic back pain.  She has had prior lumbar fusion.  She states that over the past 4 to 5 months she has had worsening pain.  Follows with pain management.  She states that she is having burning and tingling in the low back.  Does not seem to have any radicular symptoms going down the legs.  Has been incredibly painful at times despite her pain medication.  She is concerned about her hardware.   Patient Active Problem List   Diagnosis Date Noted   Chronic left-sided low back pain 04/04/2023   Morbid obesity (HCC) 03/09/2023   Non-adherence to medical treatment 03/09/2023   SVT (supraventricular tachycardia) (HCC) 03/21/2022   OSA (obstructive sleep apnea) 11/11/2021   TIA (transient ischemic attack) 11/01/2021   Urinary incontinence 08/29/2021   Chronic pain syndrome 02/28/2021   S/P lumbar fusion 02/28/2018   Prediabetes 12/04/2017   Mixed hyperlipidemia 10/11/2016   Essential hypertension, benign 12/30/2012   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   Anxiety state 08/30/2007   Migraine headache 08/30/2007   Insomnia 08/30/2007    Social Hx   Social History   Socioeconomic History   Marital status: Married    Spouse name: Not on file   Number of children: Not on file   Years of education: Not on file   Highest education level: Not on file  Occupational History   Occupation: Doesn't work outside the home  Tobacco Use   Smoking status: Former    Current packs/day: 0.00    Average packs/day: 1 pack/day for 26.0 years (26.0 ttl pk-yrs)     Types: Cigarettes    Start date: 01/08/1995    Quit date: 01/07/2021    Years since quitting: 2.2   Smokeless tobacco: Former  Building services engineer status: Every Day   Substances: Nicotine , Flavoring  Substance and Sexual Activity   Alcohol use: Yes    Alcohol/week: 0.0 standard drinks of alcohol    Comment: occasional   Drug use: Not Currently    Types: Marijuana    Comment: occasional   Sexual activity: Not Currently    Birth control/protection: Surgical  Other Topics Concern   Not on file  Social History Narrative   Not on file   Social Drivers of Health   Financial Resource Strain: Not on file  Food Insecurity: Not on file  Transportation Needs: Not on file  Physical Activity: Not on file  Stress: Not on file  Social Connections: Not on file    Review of Systems Per HPI  Objective:  BP 136/81   Pulse 63   Temp (!) 97.2 F (36.2 C)   Ht 5\' 7"  (1.702 m)   Wt 266 lb (120.7 kg)   LMP 02/25/2016   SpO2 96%   BMI 41.66 kg/m      04/04/2023   11:20 AM 04/03/2023    2:10 PM 03/09/2023    8:43 AM  BP/Weight  Systolic BP 136 113 140  Diastolic  BP 81 72 92  Wt. (Lbs) 266 265.6 259.8  BMI 41.66 kg/m2 41.6 kg/m2 40.69 kg/m2    Physical Exam Constitutional:      General: She is not in acute distress.    Appearance: She is obese.  HENT:     Head: Normocephalic and atraumatic.  Cardiovascular:     Rate and Rhythm: Normal rate and regular rhythm.  Pulmonary:     Effort: Pulmonary effort is normal.     Breath sounds: Normal breath sounds.  Neurological:     Mental Status: She is alert.  Psychiatric:        Mood and Affect: Mood normal.        Behavior: Behavior normal.     Lab Results  Component Value Date   WBC 9.2 03/06/2022   HGB 13.1 03/06/2022   HCT 39.9 03/06/2022   PLT 257 03/06/2022   GLUCOSE 115 02/26/2023   CHOL 224 (H) 02/26/2023   TRIG 368 (H) 02/26/2023   HDL 38 (L) 02/26/2023   LDLDIRECT 134 (H) 10/31/2021   LDLCALC 133 (H)  02/26/2023   ALT 20 02/26/2023   AST 18 02/26/2023   NA 140 02/26/2023   K 3.8 02/26/2023   CL 102 02/26/2023   CREATININE 0.81 02/26/2023   BUN 12 02/26/2023   CO2 28 02/26/2023   TSH 15.11 (H) 02/26/2023   INR 1.0 10/31/2021   HGBA1C 6.1 (H) 05/04/2022     Assessment & Plan:   Problem List Items Addressed This Visit       Other   Chronic left-sided low back pain - Primary   X-ray today for further evaluation.  Patient will continue her home pain medication.      Relevant Orders   DG Lumbar Spine Complete    Follow-up: Pending x-ray result  Brittney Mucha Debrah Fan DO [facility]

## 2023-04-04 NOTE — Assessment & Plan Note (Signed)
 X-ray today for further evaluation.  Patient will continue her home pain medication.

## 2023-04-04 NOTE — Patient Instructions (Signed)
 Continue with current medication.  X-ray today.  Take care  Dr. Debrah Fan

## 2023-04-05 ENCOUNTER — Other Ambulatory Visit: Payer: Self-pay

## 2023-04-05 ENCOUNTER — Other Ambulatory Visit: Payer: Self-pay | Admitting: Family Medicine

## 2023-04-05 DIAGNOSIS — I10 Essential (primary) hypertension: Secondary | ICD-10-CM

## 2023-04-05 DIAGNOSIS — G8929 Other chronic pain: Secondary | ICD-10-CM

## 2023-04-05 MED ORDER — LISINOPRIL 20 MG PO TABS
20.0000 mg | ORAL_TABLET | Freq: Every day | ORAL | 2 refills | Status: DC
Start: 2023-04-05 — End: 2023-06-05

## 2023-04-05 NOTE — Telephone Encounter (Signed)
Copied from CRM [id number]. Topic: Clinical - Medication Refill >> Apr 05, 2023  2:12 PM Fuller Mandril wrote: Most Recent Primary Care Visit:  Provider: Tommie Sams  Department: [facility]  Visit Type: ACUTE  Date: 04/04/2023  Medication: lisinopril (ZESTRIL) 20 MG tablet  Has the patient contacted their pharmacy? Yes (Agent: If no, request that the patient contact the pharmacy for the refill. If patient does not wish to contact the pharmacy document the reason why and proceed with request.) (Agent: If yes, when and what did the pharmacy advise?) Pharmacy states she need to contact provider - provider has to request   Is this the correct pharmacy for this prescription? Yes If no, delete pharmacy and type the correct one.  This is the patient's preferred pharmacy:  [facility] [address], [place] - [address] [place] [address] [place] [address] Phone: [phone] Fax: [phone]   Has the prescription been filled recently? N/A  Is the patient out of the medication? Yes  Has the patient been seen for an appointment in the last year OR does the patient have an upcoming appointment? Yes 04/04/2023  Can we respond through MyChart? Yes  Agent: Please be advised that Rx refills may take up to 3 business days. We ask that you follow-up with your pharmacy.

## 2023-04-12 DIAGNOSIS — M4807 Spinal stenosis, lumbosacral region: Secondary | ICD-10-CM | POA: Diagnosis not present

## 2023-05-01 ENCOUNTER — Other Ambulatory Visit: Payer: Self-pay | Admitting: Physical Medicine and Rehabilitation

## 2023-05-01 MED ORDER — OXYCODONE-ACETAMINOPHEN 5-325 MG PO TABS: 1.0000 | ORAL_TABLET | Freq: Two times a day (BID) | ORAL | 0 refills | Status: DC | PRN

## 2023-05-15 DIAGNOSIS — M5416 Radiculopathy, lumbar region: Secondary | ICD-10-CM | POA: Diagnosis not present

## 2023-05-21 ENCOUNTER — Other Ambulatory Visit (HOSPITAL_COMMUNITY): Payer: Self-pay | Admitting: Neurological Surgery

## 2023-05-21 DIAGNOSIS — M5416 Radiculopathy, lumbar region: Secondary | ICD-10-CM

## 2023-05-25 ENCOUNTER — Ambulatory Visit (HOSPITAL_COMMUNITY)
Admission: RE | Admit: 2023-05-25 | Discharge: 2023-05-25 | Disposition: A | Discharge status: Home / Self Care | Source: Ambulatory Visit | Attending: Neurological Surgery | Admitting: Neurological Surgery

## 2023-05-25 DIAGNOSIS — M48061 Spinal stenosis, lumbar region without neurogenic claudication: Secondary | ICD-10-CM | POA: Diagnosis not present

## 2023-05-25 DIAGNOSIS — M5416 Radiculopathy, lumbar region: Secondary | ICD-10-CM | POA: Insufficient documentation

## 2023-05-25 DIAGNOSIS — M4317 Spondylolisthesis, lumbosacral region: Secondary | ICD-10-CM | POA: Diagnosis not present

## 2023-05-25 DIAGNOSIS — M5126 Other intervertebral disc displacement, lumbar region: Secondary | ICD-10-CM | POA: Diagnosis not present

## 2023-05-25 DIAGNOSIS — M438X6 Other specified deforming dorsopathies, lumbar region: Secondary | ICD-10-CM | POA: Diagnosis not present

## 2023-05-30 ENCOUNTER — Other Ambulatory Visit: Payer: Self-pay | Admitting: Physical Medicine and Rehabilitation

## 2023-05-31 DIAGNOSIS — E782 Mixed hyperlipidemia: Secondary | ICD-10-CM | POA: Diagnosis not present

## 2023-05-31 DIAGNOSIS — E890 Postprocedural hypothyroidism: Secondary | ICD-10-CM | POA: Diagnosis not present

## 2023-05-31 MED ORDER — OXYCODONE-ACETAMINOPHEN 5-325 MG PO TABS: 1.0000 | ORAL_TABLET | Freq: Two times a day (BID) | ORAL | 0 refills | Status: DC | PRN

## 2023-06-01 LAB — LIPID PANEL
Cholesterol: 183 mg/dL (ref <200)
HDL: 37 mg/dL — ABNORMAL LOW (ref >=50)
LDL Cholesterol (Calc): 99 mg/dL
Non-HDL Cholesterol (Calc): 146 mg/dL — ABNORMAL HIGH (ref <130)
Total CHOL/HDL Ratio: 4.9 (calc) (ref <5.0)
Triglycerides: 351 mg/dL — ABNORMAL HIGH (ref <150)

## 2023-06-01 LAB — T4, FREE: Free T4: 1.8 ng/dL (ref 0.8–1.8)

## 2023-06-01 LAB — TSH: TSH: 0.03 m[IU]/L — ABNORMAL LOW

## 2023-06-04 ENCOUNTER — Encounter
Payer: BC Managed Care – PPO | Attending: Physical Medicine and Rehabilitation | Admitting: Physical Medicine and Rehabilitation

## 2023-06-04 VITALS — BP 132/88 | HR 76 | Ht 67.0 in | Wt 259.0 lb

## 2023-06-04 DIAGNOSIS — E0590 Thyrotoxicosis, unspecified without thyrotoxic crisis or storm: Secondary | ICD-10-CM | POA: Diagnosis not present

## 2023-06-04 DIAGNOSIS — G8929 Other chronic pain: Secondary | ICD-10-CM | POA: Insufficient documentation

## 2023-06-04 DIAGNOSIS — M5442 Lumbago with sciatica, left side: Secondary | ICD-10-CM | POA: Diagnosis not present

## 2023-06-04 MED ORDER — OXYCODONE-ACETAMINOPHEN 5-325 MG PO TABS
1.0000 | ORAL_TABLET | Freq: Two times a day (BID) | ORAL | 0 refills | Status: DC | PRN
Start: 2023-06-04 — End: 2023-07-06

## 2023-06-04 NOTE — Patient Instructions (Signed)
 Foods that may reduce pain: 1) Ginger (especially studied for arthritis)- reduce leukotriene production to decrease inflammation 2) Blueberries- high in phytonutrients that decrease inflammation 3) Salmon- marine omega-3s reduce joint swelling and pain 4) Pumpkin seeds- reduce inflammation 5) dark chocolate- reduces inflammation 6) turmeric- reduces inflammation 7) tart cherries - reduce pain and stiffness 8) extra virgin olive oil - its compound olecanthal helps to block prostaglandins  9) chili peppers- can be eaten or applied topically via capsaicin 10) mint- helpful for headache, muscle aches, joint pain, and itching 11) garlic- reduces inflammation  Link to further information on diet for chronic pain: [email or web address].[email or web address]

## 2023-06-04 NOTE — Addendum Note (Signed)
 Addended by: Horton Chin on: 06/04/2023 03:08 PM   Modules accepted: Level of Service

## 2023-06-04 NOTE — Progress Notes (Addendum)
 Subjective:    Patient ID: Teresa Vincent, female    DOB: Jan 10, 1981, 43 y.o.   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 -pain has been horrible -she has been feeling intermittent sensations for a few months; -feels like a tingling, burning, cooling sensation in lumbar area -has been getting worse she thinks because she has been standing -she thinks something is pinched or a screw was dislodged -she has been super busy at work- has not had a day of since August 15th since she picked up a 2nd job -she needs to go see her mom tomorrow -she was also doing soup kitchen with her church -this is her first weekend and she plans on relaxing -has a tens unit and it does help but if the pain is through the roof then it doesn't help -she tries to use the tens three times per day Teresa Vincent is a 43 year old woman who presents to establish care for left sided lower back pain that radiates into her left leg.  -she felt no benefit from going to the chiropractor.  -pain has been severe recently -twice a month she is working 7 days a week and she talked with her boss's boss about slowing down a little and he is seeing if this possible.  -she takes care of the garden center at [employer] -Pain has been present for years -She has had two back surgeries.  -She has had 7 injections but got no relief from these.  -She has gone back to Dr. Yetta Vincent who did her surgery.  -She was on oxycodone 5mg  that she broke in half to 2.5s which helped. This helped her to get through the day. She has been on it for years. She has been following with Teresa Vincent.  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  -feels better when laying down, but also painful while sitting -today has been a bad day -she uses her electric blanket and heating pad -she has been working at [employer]- she is  the cold at 5am in the morning and this is when the pain is worse.  -the colder it is the worse it is hurting -she got a year membership with chiropractor and has been there for a month. She has not found benefit yet.  2) left knee pain: -has had steroid injection with benefit.  -this has continued to benefit.   3) left foot pain -prescribed meloxicam by Teresa Vincent -seeing a doctor today and will told whether she needs surgical pain. She wants whatever is necessary to stop the pain -her arch stays swollen -she was supposed to do PT last month but was unable to get in until 6/6 -still present  4) Anxiety -she gets anxious about driving and she gets palpitations -she has had a prior stroke and has felt this anxiety since this time.   5) migraines -has at baseline but last week was very severe, particularly Wednesday -she considered going to the ED on Wednesday due to her pain but pain improved without new intervention by Thursday -she saw her PCP this morning and was recommended to take Imitrex -she does have a history of remote stroke which was thought to be related to her thyroid disease -she has been doing ok now -her thyroid levels had gone way out of control.  -she received a shot in the ED  for her pain  -pain has been stable -she was hopeful that the more she lost weight her pain would improve  6) Loss of cervical lordosis: -working with chiropractor on improving this and has already seen improvements -joinged a gym  7) morbid obesity -she is put on a strict diet with her endocrinologist.  -lost 22 lbs in a month. She went from 262 to 244.2 today.  -husband is doing the same diet with her and he lost 34 lbs.  -they do portion control sizes and they eat meat once per week -they went vegan with a lot of things, cream cheese and cheese -they have a cheat day once per week.   8) Left foot pain Resolved with ECSWT  9) Achilles tendinosis, right -she is considering  surgery -she is scheduled with me for [facility]  10) Type 2 diabetes -every now and then she will have a cheat day -she thinks her endocrinologist will be upset with her that her HgbA1c improves -she has lost 36 lbs -she has stopped soft drinks -she shows be her improvements in her lipid panel -discussed that se loves greens, hummus -her last bite is often 5:30-6;:30, sometimes 7pm if she is hungry.   11) Right foot pain: -much better after shockwave therapy -continues to be improed  12) Stress: -regarding getting custody of her grandkids as her eldest daughter has a drug addiction  13) Hyperthyroidism: -she would like referral to another endocrinologist -discussed that she has been having chest pain, tachycardia, vision abnormalities, eye twitching -  Pain Inventory Average Pain 8 Pain Right Now 8 My pain is constant, sharp, burning, dull, stabbing, and aching  In the last 24 hours, has pain interfered with the following? General activity 10 Relation with others 10 Enjoyment of life 10 What TIME of day is your pain at its worst? morning , daytime, evening, and night Sleep (in general) Fair Pain is worse with: walking, bending, sitting, inactivity, standing, and some activites Pain improves with: medication  Relief from Meds: 5     Family History  Problem Relation Age of Onset   Hypertension Mother    Thyroid disease Neg Hx    Social History   Socioeconomic History   Marital status: Married    Spouse name: Not on file   Number of children: Not on file   Years of education: Not on file   Highest education level: Not on file  Occupational History   Occupation: Doesn't work outside the home  Tobacco Use   Smoking status: Former    Current packs/day: 0.00    Average packs/day: 1 pack/day for 26.0 years (26.0 ttl pk-yrs)    Types: Cigarettes    Start date: 01/08/1995    Quit date: 01/07/2021    Years since quitting: 2.4   Smokeless tobacco: Former  Haematologist status: Every Day   Substances: Nicotine, Flavoring  Substance and Sexual Activity   Alcohol use: Yes    Alcohol/week: 0.0 standard drinks of alcohol    Comment: occasional   Drug use: Not Currently    Types: Marijuana    Comment: occasional   Sexual activity: Not Currently    Birth control/protection: Surgical  Other Topics Concern   Not on file  Social History Narrative   Not on file   Social Drivers of Health   Financial Resource Strain: Not on file  Food Insecurity: Not on file  Transportation Needs: Not on file  Physical Activity: Not on file  Stress: Not on file  Social Connections: Not on file   Past Surgical History:  Procedure Laterality Date   ABDOMINAL EXPOSURE N/A 05/29/2017   Procedure: ABDOMINAL EXPOSURE;  Surgeon: Chuck Hint, MD;  Location[facility] OR;  Service: Vascular;  Laterality: N/A;   ABDOMINAL HYSTERECTOMY     ANTERIOR LUMBAR FUSION N/A 05/29/2017   Procedure: Anterior Lumbar Interbody Fusion  - Lumbar five sacral one;  Surgeon: Tia Alert, MD;  Location: [facility] OR;  Service: Neurosurgery;  Laterality: N/A;   BIOPSY  02/17/2020   Procedure: BIOPSY;  Surgeon: Lanelle Bal, DO;  Location: [facility];  Service: Endoscopy;;  gastric   CARPAL TUNNEL RELEASE Bilateral    CHOLECYSTECTOMY     CHOLECYSTECTOMY N/A 12/29/2016   Procedure: LAPAROSCOPIC CHOLECYSTECTOMY;  Surgeon: [name], MD;  Location: [facility];  Service: General;  Laterality: N/A;   COLONOSCOPY WITH PROPOFOL N/A 02/17/2020   non-bleeding internal hemorrhoids, otherwise normal.   CYSTOSCOPY/URETEROSCOPY/HOLMIUM LASER/STENT PLACEMENT Right 06/25/2019   Procedure: CYSTOSCOPY WITH RIGHT RETROGRADE PYELOGRAM/RIGHT URETEROSCOPY/HOLMIUM LASER APPLICATION RIGHT URETERAL CALCULUS/RIGHT URETERAL STENT PLACEMENT;  Surgeon: Malen Gauze, MD;  Location: [facility];  Service: Urology;  Laterality: Right;   ESOPHAGOGASTRODUODENOSCOPY (EGD) WITH PROPOFOL N/A 02/17/2020    Procedure: ESOPHAGOGASTRODUODENOSCOPY (EGD) WITH PROPOFOL;  Surgeon: Lanelle Bal, DO;  Location: [facility];  Service: Endoscopy;  Laterality: N/A;   EXTRACORPOREAL SHOCK WAVE LITHOTRIPSY Right 04/10/2019   Procedure: EXTRACORPOREAL SHOCK WAVE LITHOTRIPSY (ESWL);  Surgeon: Noel Christmas, MD;  [facility]: [facility];  Service: Urology;  Laterality: Right;   LAMINECTOMY WITH POSTERIOR LATERAL ARTHRODESIS LEVEL 1 N/A 02/28/2018   Procedure: Laminectomy and Foraminotomy Lumbar Five-Sacral One, posterolateral fusion and fixation;  Surgeon: Tia Alert, MD;  Location: [facility] OR;  Service: Neurosurgery;  Laterality: N/A;  posterior   LUMBAR EPIDURAL INJECTION     STERIOD INJECTION N/A 01/10/2017   Procedure: MINOR EXPAREL INJECTION;  Surgeon: [name], MD;  Location: [facility];  Service: General;  Laterality: N/A;   TOTAL ABDOMINAL HYSTERECTOMY W/ BILATERAL SALPINGOOPHORECTOMY N/A    TUBAL LIGATION  2003   Past Medical History:  Diagnosis Date   Abnormal Pap smear of cervix    ANXIETY 08/30/2007   Asthma    Chronic back pain    Claustrophobia    pt requests Versed before surgery.   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   BP 132/88   Pulse 76   Ht 5\' 7"  (1.702 m)   Wt 259 lb (117.5 kg)   LMP 02/25/2016   SpO2 96%   BMI 40.57 kg/m   Opioid Risk Score:   Fall Risk Score:  `1  Depression screen PHQ 2/9     04/03/2023    2:16 PM 12/05/2022   12:24 PM 10/02/2022    2:55 PM 04/07/2022    1:39 PM 03/21/2022    2:19 PM 03/14/2022   10:20 AM 02/28/2022    1:59 PM  Depression screen PHQ 2/9   Decreased Interest  0 0 0 0 0 0 0  Down, Depressed, Hopeless 0 0 0 0 0 0 0  PHQ - 2 Score 0 0 0 0 0 0 0  Altered sleeping    2     Tired, decreased energy    0     Change in appetite    0     Feeling bad or failure about yourself     0     Trouble concentrating    0     Moving slowly or fidgety/restless    0     Suicidal thoughts    0     PHQ-9 Score    2     Difficult doing work/chores    Somewhat difficult       Review of Systems  Constitutional:  Positive for unexpected weight change.  HENT: Negative.    Eyes: Negative.   Respiratory: Negative.    Cardiovascular:  Positive for leg swelling.  Gastrointestinal: Negative.   Endocrine: Negative.   Genitourinary: Negative.   Musculoskeletal:  Positive for arthralgias and back pain.  Skin: Negative.   Allergic/Immunologic: Negative.   Neurological:  Positive for numbness.       Tingling  Hematological: Negative.   Psychiatric/Behavioral:  The patient is nervous/anxious.   All other systems reviewed and are negative.      Objective:   Physical Exam Gen: no distress, normal appearing, weight 265lbs, BMI 40.57, 134/84 HEENT: oral mucosa pink and moist, NCAT, exopthalmos Gen: no distress, normal appearing HEENT: oral mucosa pink and moist, NCAT Cardio: Reg rate Gen: no distress, normal appearing HEENT: oral mucosa pink and moist, NCAT Cardio: Reg rate Chest: normal effort, normal rate of breathing Abd: soft, non-distended Ext: no edema Psych: pleasant, normal affect Musculoskeletal: Normal ambulation, right achiles tendinopathy, left heel spur, negative slump test, left sided lower extremity weakness    Assessment & Plan:  Mrs. Ishee is a 43 year old woman who presents to establish care for lumbar spinal stenosis/neuropathy  1) Lumbar spinal stenosis with lumbar neurogenic claudication.  -Discussed benefits of exercise in reducing pain. -Refilled Percocet -discussed disc height loss on recent XR -continue D3   -discussed that she is following with Dr. Yetta Vincent -failed lidocaine patches -discussed avoiding topamax given that this can cause hyperthyroidism -continue light restriction on bending and twisting -discussed that pain has acutely worsened -discussed that she got an XR and an MRI of her lumbar spine -Discussed current symptoms of pain and history of pain.  -Discussed benefits of exercise in reducing pain. -Discussed following foods that may reduce pain: 1) Ginger (especially studied for arthritis)- reduce leukotriene production to decrease inflammation 2) Blueberries- high in phytonutrients that decrease inflammation 3) Salmon- marine omega-3s reduce joint swelling and pain 4) Pumpkin seeds- reduce inflammation 5) dark chocolate- reduces inflammation 6) turmeric- reduces inflammation 7) tart cherries - reduce pain and stiffness 8) extra virgin olive oil - its compound olecanthal helps to block prostaglandins  9) chili peppers- can be eaten or applied topically via capsaicin 10) mint- helpful for headache, muscle aches, joint pain, and itching 11) garlic- reduces inflammation  Link to further information on diet for chronic pain: http://www.bray.com/   -discussed that her stress has not worsen her pain Continue heat therapy -discussed negative response to gabapentin in the past.  Prescribing Home Zynex NexWave Stimulator Device and supplies as needed. IFC, NMES and TENS medically necessary Treatment Rx: Daily @ 30-40 minutes per treatment PRN. Zynex NexWave only, no substitutions. Treatment  Goals: 1) To reduce and/or eliminate pain 2) To improve functional capacity and Activities of daily living 3) To reduce or prevent the need for oral medications 4) To improve circulation in the injured region 5) To decrease or prevent muscle spasm and muscle atrophy 6) To provide a self-management tool to the patient The patient has  not sufficiently improved with conservative care. Numerous studies indexed by Medline and PubMed.gov have shown Neuromuscular, Interferential, and TENS stimulators to reduce pain, improve function, and reduce medication use in injured patients. Continued use of this evidence based, safe, drug free treatment is both reasonable and medically necessary at this time.    Continue UDS monitoring -Discussed current symptoms -She is allergic to bupivicaine, she has tried surgery, injections, medication. Oxycodone 5mg  helped. She has never failed a drug screen. She works at [employer]. Increase oxycodone to 5mg  daily.  -Will obtain UDS and pain contract today. If results negative, can prescribe Percocet 5mg -325 monthly PRN.  Lumbar MRI reviewed as follows:  IMPRESSION: 1. No impingement identified in the lumbar spine. 2. 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 3. Degenerative disc disease at T11-12, L2- 3, L3-4, and L5-S1. 4. Cholelithiasis.   2) Obesity: weight 265 lbs, BMI 40.10 -Educated regarding health benefits of weight loss- for pain, general health, chronic disease prevention, immune health, mental health.  -continue black coffee -Educated regarding health benefits of weight loss- for pain, general health, chronic disease prevention, immune health, mental health.  -Will monitor weight every visit.  -Consider Roobois tea daily.  -Discussed the benefits of intermittent fasting. -Discussed foods that can assist in weight loss: 1) leafy greens- high in fiber and nutrients 2) dark chocolate- improves metabolism (if prefer sweetened, best to sweeten with honey instead of sugar).  3) cruciferous vegetables- high in fiber and protein 4) full fat yogurt: high in healthy fat, protein, calcium, and probiotics 5) apples- high in a variety of phytochemicals 6) nuts- high in fiber and protein that increase feelings of fullness 7) grapefruit: rich in nutrients, antioxidants,  and fiber (not to be taken with anticoagulation) 8) beans- high in protein and fiber 9) salmon- has high quality protein and healthy fats 10) green tea- rich in polyphenols 11) eggs- rich in choline and vitamin D 12) tuna- high protein, boosts metabolism 13) avocado- decreases visceral abdominal fat 14) chicken (pasture raised): high in protein and iron 15) blueberries- reduce abdominal fat and cholesterol 16) whole grains- decreases calories retained during digestion, speeds metabolism 17) chia seeds- curb appetite 18) chilies- increases fat metabolism  -Discussed supplements that can be used:  1) Metatrim 400mg  BID 30 minutes before breakfast and dinner  2) Sphaeranthus indicus and Garcinia mangostana (combinations of these and #1 can be found in capsicum and zychrome  3) green coffee bean extract 400mg  twice per day or Irvingia (african mango) 150 to 300mg  twice per day.   3) migraines -agree with PCP that imitrex is a great option. Discussed with her that there is increased risk of stroke. She states thyroid function is currently well controlled -discussed any potential triggers- she notes new perfume last week which could have been a trigger -advised to continue to be attentive for other food or chemical related triggers   4) HTN: -Advised checking BP daily at home and logging results to bring into follow-up appointment with PCP and myself. -Reviewed BP meds today.  -Advised regarding healthy foods that can help lower blood pressure and provided with a list: 1)  citrus foods- high in vitamins and minerals 2) salmon and other fatty fish - reduces inflammation and oxylipins 3) swiss chard (leafy green)- high level of nitrates 4) pumpkin seeds- one of the best natural sources of magnesium 5) Beans and lentils- high in fiber, magnesium, and potassium 6) Berries- high in flavonoids 7) Amaranth (whole grain, can be cooked similarly to rice and oats)- high in magnesium and fiber 8)  Pistachios- even more effective at reducing BP than other nuts 9) Carrots- high in phenolic compounds that relax blood vessels and reduce inflammation 10) Celery- contain phthalides that relax tissues of arterial walls 11) Tomatoes- can also improve cholesterol and reduce risk of heart disease 12) Broccoli- good source of magnesium, calcium, and potassium 13) Greek yogurt: high in potassium and calcium 14) Herbs and spices: Celery seed, cilantro, saffron, lemongrass, black cumin, ginseng, cinnamon, cardamom, sweet basil, and ginger 15) Chia and flax seeds- also help to lower cholesterol and blood sugar 16) Beets- high levels of nitrates that relax blood vessels  17) spinach and bananas- high in potassium  -Provided lise of supplements that can help with hypertension:  1) magnesium: one high quality brand is Bioptemizers since it contains all 7 types of magnesium, otherwise over the counter magnesium gluconate 400mg  is a good option 2) B vitamins 3) vitamin D 4) potassium 5) CoQ10 6) L-arginine 7) Vitamin C 8) Beetroot -Educated that goal BP is 120/80. -Made goal to incorporate some of the above foods into diet.    5) Left heel plantar fascitis: Discussed resolution with ECSWT  6) Right Achiles tendinopathy: -discussed resolution with ECSWT -discussed continued improvement  7) Type 2 diabetes: -Discussed the benefits of intermittent fasting. Recommended starting with pushing dinner 15 minutes earlier and when this feels easy, continuing to push dinner 15 minutes earlier. Discussed that this can help her body to improve its ability to burn fat rather than glucose, improving insulin sensitivity. Recommended drinking Roobois tea in the evening to help curb appetite and for its numerous health benefits.   -discussed most recent HgbA1c of 6.2. -discussed that she is doing an incredible job with her intermittent fasting -commended eating smaller dinners.  -continue steamed  vegetables -continue protein shake -discussed the importance of fiber.   8) Nausea: refilled zofran  9) Stress: Discussed stress from obtaining the custody of her grandkids due to her daughter having issues with drug abuse  10) Sleep apnea: continue CPAP  11) Hyperthyroidism 2/2 Grave's disease: -discussed that recent thyroid labs have worsened and her symptoms have greatly worsened -referred to endocrinology  >40 minutes spent in discussion of her hyperthyroidism, Grave's disease, low TSH, discussed trying cruciferous vegetables, prescribing oxycodone for her pain, referred to endocrinology, discussed her BP with her, discussed her chest pain, tachycardia, dose of levothyroxine

## 2023-06-05 ENCOUNTER — Ambulatory Visit: Payer: Self-pay | Admitting: Family Medicine

## 2023-06-05 DIAGNOSIS — I10 Essential (primary) hypertension: Secondary | ICD-10-CM

## 2023-06-05 MED ORDER — LISINOPRIL 20 MG PO TABS: 20.0000 mg | ORAL_TABLET | Freq: Every day | ORAL | 2 refills | Status: DC

## 2023-06-05 NOTE — Telephone Encounter (Signed)
 Additional Notes: Spoke with Cassie from [facility]. She is calling about whether the provider would prescribe a 90-day supply of lisinopril 20 mg. This RN confirmed with Cassie that the provider previously sent a prescription for a 90 day supply. Cassie states the pt will reach out when she needs a refill to confirm which pharmacy to send medication.    Copied from CRM [id number]. Topic: Clinical - Prescription Issue >> Jun 05, 2023  4:27 PM Everette C wrote: Reason for CRM: Cassie with [facility] would like to further discuss and coordinate prescription refills for the patient Reason for Disposition  Health Information question, no triage required and triager able to answer question  Answer Assessment - Initial Assessment Questions 1. REASON FOR CALL or QUESTION: "What is your reason for calling today?" or "How can I best help you?" or "What question do you have that I can help answer?"     90 day supply for lisinopril  Protocols used: Information Only Call - No Triage-A-AH

## 2023-06-06 ENCOUNTER — Emergency Department (HOSPITAL_COMMUNITY)

## 2023-06-06 ENCOUNTER — Emergency Department (HOSPITAL_COMMUNITY)
Admission: EM | Admit: 2023-06-06 | Discharge: 2023-06-06 | Disposition: A | Discharge status: Home / Self Care | Attending: Emergency Medicine | Admitting: Emergency Medicine

## 2023-06-06 ENCOUNTER — Other Ambulatory Visit: Payer: Self-pay

## 2023-06-06 DIAGNOSIS — R200 Anesthesia of skin: Secondary | ICD-10-CM | POA: Diagnosis not present

## 2023-06-06 DIAGNOSIS — I10 Essential (primary) hypertension: Secondary | ICD-10-CM | POA: Diagnosis not present

## 2023-06-06 DIAGNOSIS — R531 Weakness: Secondary | ICD-10-CM | POA: Diagnosis not present

## 2023-06-06 DIAGNOSIS — G43809 Other migraine, not intractable, without status migrainosus: Secondary | ICD-10-CM | POA: Diagnosis not present

## 2023-06-06 DIAGNOSIS — E0580 Other thyrotoxicosis without thyrotoxic crisis or storm: Secondary | ICD-10-CM | POA: Diagnosis not present

## 2023-06-06 DIAGNOSIS — E211 Secondary hyperparathyroidism, not elsewhere classified: Secondary | ICD-10-CM | POA: Diagnosis not present

## 2023-06-06 DIAGNOSIS — I639 Cerebral infarction, unspecified: Secondary | ICD-10-CM | POA: Diagnosis not present

## 2023-06-06 DIAGNOSIS — R519 Headache, unspecified: Secondary | ICD-10-CM | POA: Diagnosis not present

## 2023-06-06 LAB — COMPREHENSIVE METABOLIC PANEL
ALT: 29 U/L (ref 0–44)
AST: 24 U/L (ref 15–41)
Albumin: 3.7 g/dL (ref 3.5–5.0)
Alkaline Phosphatase: 71 U/L (ref 38–126)
Anion gap: 12 (ref 5–15)
BUN: 13 mg/dL (ref 6–20)
CO2: 25 mmol/L (ref 22–32)
Calcium: 9.6 mg/dL (ref 8.9–10.3)
Chloride: 99 mmol/L (ref 98–111)
Creatinine, Ser: 0.76 mg/dL (ref 0.44–1.00)
GFR, Estimated: >60 mL/min (ref >60)
Glucose, Bld: 152 mg/dL — ABNORMAL HIGH (ref 70–99)
Potassium: 4 mmol/L (ref 3.5–5.1)
Sodium: 136 mmol/L (ref 135–145)
Total Bilirubin: 0.5 mg/dL (ref 0.0–1.2)
Total Protein: 7 g/dL (ref 6.5–8.1)

## 2023-06-06 LAB — DIFFERENTIAL
Abs Immature Granulocytes: 0.03 10*3/uL (ref 0.00–0.07)
Basophils Absolute: 0 10*3/uL (ref 0.0–0.1)
Basophils Relative: 1 %
Eosinophils Absolute: 0.1 10*3/uL (ref 0.0–0.5)
Eosinophils Relative: 2 %
Immature Granulocytes: 0 %
Lymphocytes Relative: 31 %
Lymphs Abs: 2.5 10*3/uL (ref 0.7–4.0)
Monocytes Absolute: 0.7 10*3/uL (ref 0.1–1.0)
Monocytes Relative: 8 %
Neutro Abs: 4.8 10*3/uL (ref 1.7–7.7)
Neutrophils Relative %: 58 %

## 2023-06-06 LAB — I-STAT CHEM 8, ED
BUN: 12 mg/dL (ref 6–20)
Calcium, Ion: 1.16 mmol/L (ref 1.15–1.40)
Chloride: 103 mmol/L (ref 98–111)
Creatinine, Ser: 0.8 mg/dL (ref 0.44–1.00)
Glucose, Bld: 153 mg/dL — ABNORMAL HIGH (ref 70–99)
HCT: 38 % (ref 36.0–46.0)
Hemoglobin: 12.9 g/dL (ref 12.0–15.0)
Potassium: 4.1 mmol/L (ref 3.5–5.1)
Sodium: 138 mmol/L (ref 135–145)
TCO2: 25 mmol/L (ref 22–32)

## 2023-06-06 LAB — CBC
HCT: 40.1 % (ref 36.0–46.0)
Hemoglobin: 13.5 g/dL (ref 12.0–15.0)
MCH: 30.4 pg (ref 26.0–34.0)
MCHC: 33.7 g/dL (ref 30.0–36.0)
MCV: 90.3 fL (ref 80.0–100.0)
Platelets: 235 10*3/uL (ref 150–400)
RBC: 4.44 MIL/uL (ref 3.87–5.11)
RDW: 12.2 % (ref 11.5–15.5)
WBC: 8.3 10*3/uL (ref 4.0–10.5)
nRBC: 0 % (ref 0.0–0.2)

## 2023-06-06 LAB — URINALYSIS, ROUTINE W REFLEX MICROSCOPIC
Bilirubin Urine: NEGATIVE
Glucose, UA: NEGATIVE mg/dL
Hgb urine dipstick: NEGATIVE
Ketones, ur: NEGATIVE mg/dL
Leukocytes,Ua: NEGATIVE
Nitrite: NEGATIVE
Protein, ur: NEGATIVE mg/dL
Specific Gravity, Urine: 1.013 (ref 1.005–1.030)
pH: 7 (ref 5.0–8.0)

## 2023-06-06 LAB — APTT: aPTT: 28 s (ref 24–36)

## 2023-06-06 LAB — RAPID URINE DRUG SCREEN, HOSP PERFORMED
Amphetamines: NOT DETECTED
Barbiturates: NOT DETECTED
Benzodiazepines: NOT DETECTED
Cocaine: NOT DETECTED
Opiates: NOT DETECTED
Tetrahydrocannabinol: NOT DETECTED

## 2023-06-06 LAB — ETHANOL: Alcohol, Ethyl (B): <10 mg/dL (ref <10)

## 2023-06-06 LAB — CBG MONITORING, ED: Glucose-Capillary: 136 mg/dL — ABNORMAL HIGH (ref 70–99)

## 2023-06-06 LAB — PROTIME-INR
INR: 0.9 (ref 0.8–1.2)
Prothrombin Time: 12.3 s (ref 11.4–15.2)

## 2023-06-06 LAB — TSH: TSH: 0.028 u[IU]/mL — ABNORMAL LOW (ref 0.350–4.500)

## 2023-06-06 MED ORDER — KETOROLAC TROMETHAMINE 30 MG/ML IJ SOLN
30.0000 mg | Freq: Once | INTRAMUSCULAR | Status: AC
  Administered 2023-06-06: 30 mg via INTRAVENOUS
  Filled 2023-06-06: qty 1

## 2023-06-06 MED ORDER — LORAZEPAM 2 MG/ML IJ SOLN
1.0000 mg | Freq: Once | INTRAMUSCULAR | Status: AC
  Administered 2023-06-06: 1 mg via INTRAVENOUS
  Filled 2023-06-06: qty 1

## 2023-06-06 MED ORDER — DIPHENHYDRAMINE HCL 50 MG/ML IJ SOLN
25.0000 mg | Freq: Once | INTRAMUSCULAR | Status: AC
  Administered 2023-06-06: 25 mg via INTRAVENOUS
  Filled 2023-06-06: qty 1

## 2023-06-06 MED ORDER — MORPHINE SULFATE (PF) 4 MG/ML IV SOLN
4.0000 mg | Freq: Once | INTRAVENOUS | Status: AC
  Administered 2023-06-06: 4 mg via INTRAVENOUS
  Filled 2023-06-06: qty 1

## 2023-06-06 MED ORDER — SODIUM CHLORIDE 0.9 % IV BOLUS
1000.0000 mL | Freq: Once | INTRAVENOUS | Status: AC
  Administered 2023-06-06: 1000 mL via INTRAVENOUS

## 2023-06-06 MED ORDER — METOCLOPRAMIDE HCL 5 MG/ML IJ SOLN
10.0000 mg | Freq: Once | INTRAMUSCULAR | Status: AC
  Administered 2023-06-06: 10 mg via INTRAVENOUS
  Filled 2023-06-06: qty 2

## 2023-06-06 NOTE — ED Notes (Signed)
 1610 Code stroke cart activated. Pt BIB EMS, LKW 0835- R arm heaviness, L sided HA. Pt states she has been having blurry vision in the R eye for 2 months. BP 169/102 HR 88 glucose 154 0924 pt to CT, [facility] teleneuro- Dr Amada Jupiter paged 0930 pt back from CT, Dr Roda Shutters- back up teleneuro on camera to assess pt 0950 No TNK, will obtain MRI and stroke w/u per Dr Roda Shutters

## 2023-06-06 NOTE — Discharge Instructions (Addendum)
 Hold your synthroid until you see your endocrinologist on 3/24.

## 2023-06-06 NOTE — ED Notes (Signed)
CBG 136

## 2023-06-06 NOTE — ED Triage Notes (Signed)
 Pt brought in by [facility] from work. Pt c/o blurry vision x 2 months. Pt had right arm heaviness that started at 0835 this morning and left sided headache that started around 0910. Pt reports hx of previous stroke last August due to extremely high thyroid levels. EMS reports BP 200/104, CBG 154. Pt's BP 169/102, HR 88 upon arrival to ED.

## 2023-06-06 NOTE — ED Notes (Signed)
CT CALLED.

## 2023-06-06 NOTE — ED Notes (Signed)
 Patient Alert and oriented to baseline. Stable and ambulatory to baseline. Patient verbalized understanding of the discharge instructions.  Patient belongings were taken by the patient.

## 2023-06-06 NOTE — ED Notes (Signed)
 Patient transported to CT on EMS stretcher with RN at bedside. Tele-Neuro cart transported to CT along with pt.

## 2023-06-06 NOTE — Consult Note (Addendum)
 Stroke Neurology Consultation Note  Consult Requested by: Dr. Particia Nearing  Reason for Consult: code stroke  Consult Date: 06/06/23   The history was obtained from the pt.  During history and examination, all items were able to obtain unless otherwise noted.  History of Present Illness:  Teresa Vincent is a 43 y.o. Caucasian female with PMH of migraine headache, chronic neck and back pain, hypertension, hyperlipidemia, obesity presented to [facility], ED for code stroke.  Per patient, she was at work and after having a meal, she went back to work around 8:35 AM when she fell and sudden onset left-sided headache, lightheadedness, right arm numbness and right eye blurry vision.  She stated that her left-sided headache more at forehead with weird feeling, with some lightheadedness and palpitation.  Within 10 minutes, she also felt right arm numbness tingling and some heaviness but no face or leg numbness.  She also complaining right eye not able to see but improved over time, now more have right eye right visual field difficulty.  Denies any speech changes, no nausea vomiting.  Glucose 159, BP 169/102.  She said that she had a history of Graves' disease since 2010, status post radiation therapy but not very effective however she was also put on Synthroid, last check TSH was low.  She also stated that she had a stroke in 2009.  She was admitted in 10/2021 for dizziness, had MRI, MRA head and neck negative for stroke or LVO, considered complicated migraine versus TIA.  She is on Crestor at home but not on any antiplatelet.  LSN: 8:35 AM TNK Given: No: Presentation likely not stroke, MRI no stroke mRS = 1 IR: no LVO on MRA  Past Medical History:  Diagnosis Date   Abnormal Pap smear of cervix    ANXIETY 08/30/2007   Asthma    Chronic back pain    Claustrophobia    pt requests Versed before surgery.   Complication of anesthesia    hx of waking up during anesthesia   GERD (gastroesophageal  reflux disease)    History of kidney stones    Hypertension    HYPOTHYROIDISM, POST-RADIATION 02/08/2009   INSOMNIA 08/30/2007   MIGRAINE HEADACHE 08/30/2007   Neck pain    Palpitations    occasionally couple times a week   Sciatica    Sleep apnea    cpap   Stroke (HCC) 2010   states, no deficits and on no meds now.    Past Surgical History:  Procedure Laterality Date   ABDOMINAL EXPOSURE N/A 05/29/2017   Procedure: ABDOMINAL EXPOSURE;  Surgeon: Chuck Hint, MD;  Location: [facility] OR;  Service: Vascular;  Laterality: N/A;   ABDOMINAL HYSTERECTOMY     ANTERIOR LUMBAR FUSION N/A 05/29/2017   Procedure: Anterior Lumbar Interbody Fusion  - Lumbar five sacral one;  Surgeon: Tia Alert, MD;  Location: [facility] OR;  Service: Neurosurgery;  Laterality: N/A;   BIOPSY  02/17/2020   Procedure: BIOPSY;  Surgeon: Lanelle Bal, DO;  Location: [facility];  Service: Endoscopy;;  gastric   CARPAL TUNNEL RELEASE Bilateral    CHOLECYSTECTOMY     CHOLECYSTECTOMY N/A 12/29/2016   Procedure: LAPAROSCOPIC CHOLECYSTECTOMY;  Surgeon: [name], MD;  Location: [facility];  Service: General;  Laterality: N/A;   COLONOSCOPY WITH PROPOFOL N/A 02/17/2020   non-bleeding internal hemorrhoids, otherwise normal.   CYSTOSCOPY/URETEROSCOPY/HOLMIUM LASER/STENT PLACEMENT Right 06/25/2019   Procedure: CYSTOSCOPY WITH RIGHT RETROGRADE PYELOGRAM/RIGHT URETEROSCOPY/HOLMIUM LASER APPLICATION RIGHT URETERAL CALCULUS/RIGHT URETERAL  STENT PLACEMENT;  Surgeon: Malen Gauze, MD;  Location: [facility];  Service: Urology;  Laterality: Right;   ESOPHAGOGASTRODUODENOSCOPY (EGD) WITH PROPOFOL N/A 02/17/2020   Procedure: ESOPHAGOGASTRODUODENOSCOPY (EGD) WITH PROPOFOL;  Surgeon: Lanelle Bal, DO;  Location: [facility];  Service: Endoscopy;  Laterality: N/A;   EXTRACORPOREAL SHOCK WAVE LITHOTRIPSY Right 04/10/2019   Procedure: EXTRACORPOREAL SHOCK WAVE LITHOTRIPSY (ESWL);  Surgeon: Noel Christmas, MD;  Location:  [facility];  Service: Urology;  Laterality: Right;   LAMINECTOMY WITH POSTERIOR LATERAL ARTHRODESIS LEVEL 1 N/A 02/28/2018   Procedure: Laminectomy and Foraminotomy Lumbar Five-Sacral One, posterolateral fusion and fixation;  Surgeon: Tia Alert, MD;  Location: [facility] OR;  Service: Neurosurgery;  Laterality: N/A;  posterior   LUMBAR EPIDURAL INJECTION     STERIOD INJECTION N/A 01/10/2017   Procedure: MINOR EXPAREL INJECTION;  Surgeon: [name], MD;  Location: [facility];  Service: General;  Laterality: N/A;   TOTAL ABDOMINAL HYSTERECTOMY W/ BILATERAL SALPINGOOPHORECTOMY N/A    TUBAL LIGATION  2003    Family History  Problem Relation Age of Onset   Hypertension Mother    Thyroid disease Neg Hx     Social History:  reports that she quit smoking about 2 years ago. Her smoking use included cigarettes. She started smoking about 28 years ago. She has a 26 pack-year smoking history. She has quit using smokeless tobacco. She reports current alcohol use. She reports that she does not currently use drugs after having used the following drugs: Marijuana.  Allergies:  Allergies  Allergen Reactions   Bupivacaine Swelling    Pt received CT contrast and Bupivacaine at the same time. Unsure which one caused the reaction. Dr. Mosetta Putt recommends a 13 hour prep before either one   Contrast Media [Iodinated Contrast Media] Swelling    Pt received CT contrast and Bupivacaine at the same time. Unsure which one caused the reaction. Dr. Mosetta Putt recommends a 13 hour prep before either one   Indomethacin Other (See Comments)    Dizziness, near syncope, bp went up and BG went down.   Adhesive [Tape] Other (See Comments)    Skin irritation, patient prefers paper tape.    Celexa [Citalopram Hydrobromide] Other (See Comments)    Make patient too carefree    No current facility-administered medications on file prior to encounter.   Current Outpatient Medications on File Prior to Encounter   Medication Sig Dispense Refill   albuterol (VENTOLIN HFA) 108 (90 Base) MCG/ACT inhaler Inhale 1-2 puffs into the lungs every 4 (four) hours as needed for wheezing or shortness of breath. 18 g 3   ALPRAZolam (XANAX) 0.5 MG tablet TAKE 1/2 TO 1 (ONE-HALF TO ONE) TABLET BY MOUTH TWICE DAILY AS NEEDED FOR ANXIETY 30 tablet 3   Cholecalciferol 50 MCG (2000 UT) CAPS Take 1 capsule by mouth daily.     cyclobenzaprine (FLEXERIL) 5 MG tablet Take 5 mg by mouth 3 (three) times daily as needed.     levothyroxine (SYNTHROID) 200 MCG tablet Take 1 tablet by mouth once daily 90 tablet 0   levothyroxine (SYNTHROID) 25 MCG tablet Take 1 tablet (25 mcg total) by mouth daily before breakfast. 90 tablet 1   lisinopril (ZESTRIL) 20 MG tablet Take 1 tablet (20 mg total) by mouth daily. 90 tablet 2   metoprolol succinate (TOPROL-XL) 50 MG 24 hr tablet Take 1 tablet (50 mg total) by mouth daily. 90 tablet 1   omeprazole (PRILOSEC) 40 MG capsule TAKE 1 CAPSULE  BY MOUTH TWICE DAILY BEFORE A MEAL 180 capsule 1   ondansetron (ZOFRAN-ODT) 4 MG disintegrating tablet Take 1 tablet (4 mg total) by mouth every 8 (eight) hours as needed for nausea or vomiting. 20 tablet 5   oxyCODONE-acetaminophen (PERCOCET) 5-325 MG tablet Take 1 tablet by mouth 2 (two) times daily as needed for moderate pain (pain score 4-6). 60 tablet 0   predniSONE (STERAPRED UNI-PAK 21 TAB) 10 MG (21) TBPK tablet SMARTSIG:- Tablet(s) By Mouth -     rosuvastatin (CRESTOR) 20 MG tablet Take 1 tablet (20 mg total) by mouth at bedtime. 90 tablet 1   SUMAtriptan (IMITREX) 100 MG tablet 1 tablet at the first sign of migraine. Additional dose in 2 hours if headache persists or recurs. 10 tablet 1   topiramate (TOPAMAX) 25 MG tablet Take 1 tablet (25 mg total) by mouth at bedtime. 90 tablet 3    Review of Systems: A full ROS was attempted today and was able to be performed.  Systems assessed include - Constitutional, Eyes, HENT, Respiratory, Cardiovascular,  Gastrointestinal, Genitourinary, Integument/breast, Hematologic/lymphatic, Musculoskeletal, Neurological, Behavioral/Psych, Endocrine, Allergic/Immunologic - with pertinent responses as per HPI.  Physical Examination: Temp:  [98.4 F (36.9 C)] 98.4 F (36.9 C) (03/19 0948) Pulse Rate:  [88-89] 89 (03/19 0945) Resp:  [16] 16 (03/19 0945) BP: (148-169)/(79-102) 148/79 (03/19 0945) SpO2:  [99 %] 99 % (03/19 0945)  General - well nourished, well developed, in no apparent distress, mildly anxious.    Ophthalmologic - fundi not visualized due to noncooperation.    Cardiovascular - regular rhythm and rate  Neuro - awake, alert, eyes open, orientated to age, place, time. No aphasia, fluent language, following all simple commands. Able to name and repeat and read. No gaze palsy, tracking bilaterally, visual field full on the left eye, right upper quadrant decreased acuity on the right eye. No facial droop. Tongue midline. Bilateral UEs 5/5, no drift but complaining of right UE pain on holding up. Bilaterally LEs 5/5, no drift but complaining of lower back pain. Sensation decreased on the right, b/l FTN and HTS intact, gait not tested.   NIH Stroke Scale  Level Of Consciousness 0=Alert; keenly responsive 1=Arouse to minor stimulation 2=Requires repeated stimulation to arouse or movements to pain 3=postures or unresponsive 0  LOC Questions to Month and Age 89=Answers both questions correctly 1=Answers one question correctly or dysarthria/intubated/trauma/language barrier 2=Answers neither question correctly or aphasia 0  LOC Commands      -Open/Close eyes     -Open/close grip     -Pantomime commands if communication barrier 0=Performs both tasks correctly 1=Performs one task correctly 2=Performs neighter task correctly 0  Best Gaze     -Only assess horizontal gaze 0=Normal 1=Partial gaze palsy 2=Forced deviation, or total gaze paresis 0  Visual 0=No visual loss 1=Partial  hemianopia 2=Complete hemianopia 3=Bilateral hemianopia (blind including cortical blindness) 0, right eye upper quadrant decreased acuity, left eye visual field full  Facial Palsy     -Use grimace if obtunded 0=Normal symmetrical movement 1=Minor paralysis (asymmetry) 2=Partial paralysis (lower face) 3=Complete paralysis (upper and lower face) 0  Motor  0=No drift for 10/5 seconds 1=Drift, but does not hit bed 2=Some antigravity effort, hits  bed 3=No effort against gravity, limb falls 4=No movement 0=Amputation/joint fusion Right Arm 0     Leg 0    Left Arm 0     Leg 0  Limb Ataxia     - FNT/HTS 0=Absent or does not understand or paralyzed  or amputation/joint fusion 1=Present in one limb 2=Present in two limbs 0  Sensory 0=Normal 1=Mild to moderate sensory loss 2=Severe to total sensory loss or coma/unresponsive 1  Best Language 0=No aphasia, normal 1=Mild to moderate aphasia 2=Severe aphasia 3=Mute, global aphasia, or coma/unresponsive 0  Dysarthria 0=Normal 1=Mild to moderate 2=Severe, unintelligible or mute/anarthric 0=intubated/unable to test 0  Extinction/Neglect 0=No abnormality 1=visual/tactile/auditory/spatia/personal inattention/Extinction to bilateral simultaneous stimulation 2=Profound neglect/extinction more than 1 modality  0  Total   1      Data Reviewed: CT HEAD CODE STROKE WO CONTRAST Result Date: 06/06/2023 CLINICAL DATA:  Code stroke. Provided history: Neuro deficit, acute, stroke suspected. Additional history provided: Right-sided weakness/numbness. EXAM: CT HEAD WITHOUT CONTRAST TECHNIQUE: Contiguous axial images were obtained from the base of the skull through the vertex without intravenous contrast. RADIATION DOSE REDUCTION: This exam was performed according to the departmental dose-optimization program which includes automated exposure control, adjustment of the mA and/or kV according to patient size and/or use of iterative reconstruction technique.  COMPARISON:  Brain MRI 10/31/2021. FINDINGS: Brain: Cerebral volume is normal. There is no acute intracranial hemorrhage. No demarcated cortical infarct. No extra-axial fluid collection. No evidence of an intracranial mass. No midline shift. Vascular: No hyperdense vessel. Skull: No calvarial fracture or aggressive osseous lesion. Sinuses/Orbits: No mass or acute finding within the imaged orbits. No significant paranasal sinus disease at the imaged levels. ASPECTS ([place] Stroke Program Early CT Score) - Ganglionic level infarction (caudate, lentiform nuclei, internal capsule, insula, M1-M3 cortex): 7 - Supraganglionic infarction (M4-M6 cortex): 3 Total score (0-10 with 10 being normal): 10 These results were communicated to Dr. Roda Shutters At 9:39 amon 3/19/2025by text page via the [facility] messaging system. IMPRESSION: No evidence of an acute intracranial abnormality. Electronically Signed   By: Jackey Loge D.O.   On: 06/06/2023 09:40    Assessment: 44 y.o. female with PMH of migraine headache, chronic neck and back pain, hypertension, hyperlipidemia, obesity presented to [facility], ED for left-sided headache, lightheadedness, right arm numbness and right eye blurry vision. Glucose 159, BP 169/102. LSW [id number]. NIHSS = 1 for right subjective sensory decrease. Exam also showed right eye upper quadrant decreased acuity, left eye visual field full, not consistent with stroke,  complicated migraine more likely. Stat MRI and MRA were done and ruled out stroke or LVO.    Stroke Risk Factors - hyperlipidemia, hypertension, and migraine and obesity  Plan: - stat MRI, MRA  of the brain without contrast done and no stroke or LVO found - consider HA cocktail if not improving - continue home meds - aggressive stroke risk factor modification - will refer to [facility] outpt headache clinic - follow up with PCP for risk factor modification also - pt can be discharged from neuro standpoint if HA improves, and able to ambulate in the  hallway.  - neuro will sign off for now, please feel free to call back if needed.  Consult Participants: pt, RN, stroke response RN Location of the provider: [facility] Location of the patient: [facility]  Time Code Stroke Page received:  0925 Time neurologist arrived:  0928 Time NIHSS completed: 0950    This consult was provided via telemedicine with 2-way video and audio communication. The patient/family was informed that care would be provided in this way and agreed to receive care in this manner.   This patient is receiving care for possible acute neurological changes. There was 80 minutes of care by this provider at the time of service, including time for  direct evaluation via telemedicine, review of medical records, imaging studies and discussion of findings with providers, the patient and/or family.  Marvel Plan, MD PhD Stroke Neurology 06/06/2023 10:16 AM

## 2023-06-06 NOTE — ED Provider Notes (Signed)
 [place] EMERGENCY DEPARTMENT AT [facility] Provider Note   CSN: [id number] Arrival date & time: 06/06/23  7829  An emergency department physician performed an initial assessment on this suspected stroke patient at [phone].  History  Chief Complaint  Patient presents with   Code Stroke    Teresa Vincent is a 43 y.o. female.  Pt is a 43 yo female with pmhx significant for anxiety, migraines, chronic pain, gerd, htn, sleep apnea, kidney stones, CVA, Grave's disease s/p thyroid ablation with radioactive iodine and now hypothyroidism.  Pt presents to the ED with possible CVA.  She said she was fine this am.  She was at work and suddenly felt right arm numb and "heavy."  Pt took a percocet, but it did not help her headache.  Due to sx starting an hour prior to arrival, a code stroke was called and pt taken directly to the CT scanner.       Home Medications Prior to Admission medications   Medication Sig Start Date End Date Taking? Authorizing Provider  albuterol (VENTOLIN HFA) 108 (90 Base) MCG/ACT inhaler Inhale 1-2 puffs into the lungs every 4 (four) hours as needed for wheezing or shortness of breath. 08/29/21   Tommie Sams, DO  ALPRAZolam (XANAX) 0.5 MG tablet TAKE 1/2 TO 1 (ONE-HALF TO ONE) TABLET BY MOUTH TWICE DAILY AS NEEDED FOR ANXIETY 02/13/23   Tommie Sams, DO  Cholecalciferol 50 MCG (2000 UT) CAPS Take 1 capsule by mouth daily.    [provider]  cyclobenzaprine (FLEXERIL) 5 MG tablet Take 5 mg by mouth 3 (three) times daily as needed.    [provider]  levothyroxine (SYNTHROID) 200 MCG tablet Take 1 tablet by mouth once daily 02/12/23   Roma Kayser, MD  levothyroxine (SYNTHROID) 25 MCG tablet Take 1 tablet (25 mcg total) by mouth daily before breakfast. 03/09/23   Nida, Denman George, MD  lisinopril (ZESTRIL) 20 MG tablet Take 1 tablet (20 mg total) by mouth daily. 06/05/23   Tommie Sams, DO  metoprolol succinate (TOPROL-XL)  50 MG 24 hr tablet Take 1 tablet (50 mg total) by mouth daily. 03/30/23   Tommie Sams, DO  omeprazole (PRILOSEC) 40 MG capsule TAKE 1 CAPSULE BY MOUTH TWICE DAILY BEFORE A MEAL 12/07/21   Gelene Mink, NP  ondansetron (ZOFRAN-ODT) 4 MG disintegrating tablet Take 1 tablet (4 mg total) by mouth every 8 (eight) hours as needed for nausea or vomiting. 10/03/22   Tommie Sams, DO  oxyCODONE-acetaminophen (PERCOCET) 5-325 MG tablet Take 1 tablet by mouth 2 (two) times daily as needed for moderate pain (pain score 4-6). 06/04/23 06/03/24  Raulkar, Drema Pry, MD  predniSONE (STERAPRED UNI-PAK 21 TAB) 10 MG (21) TBPK tablet SMARTSIG:- Tablet(s) By Mouth - 04/01/23   [provider]  rosuvastatin (CRESTOR) 20 MG tablet Take 1 tablet (20 mg total) by mouth at bedtime. 03/09/23   Roma Kayser, MD  SUMAtriptan (IMITREX) 100 MG tablet 1 tablet at the first sign of migraine. Additional dose in 2 hours if headache persists or recurs. 02/28/21   Tommie Sams, DO  topiramate (TOPAMAX) 25 MG tablet Take 1 tablet (25 mg total) by mouth at bedtime. 04/03/23   Raulkar, Drema Pry, MD      Allergies    Bupivacaine, Contrast media [iodinated contrast media], Indomethacin, Adhesive [tape], and Celexa [citalopram hydrobromide]    Review of Systems   Review of Systems  Eyes:  Positive  for visual disturbance.  Neurological:  Positive for weakness, numbness and headaches.  All other systems reviewed and are negative.   Physical Exam Updated Vital Signs BP (!) 144/91   Pulse 88   Temp 98.4 F (36.9 C)   Resp 17   LMP 02/25/2016   SpO2 99%  Physical Exam Vitals and nursing note reviewed.  Constitutional:      Appearance: Normal appearance. She is obese.  HENT:     Head: Normocephalic and atraumatic.     Right Ear: External ear normal.     Left Ear: External ear normal.     Nose: Nose normal.     Mouth/Throat:     Mouth: Mucous membranes are moist.     Pharynx: Oropharynx is clear.  Eyes:      Extraocular Movements: Extraocular movements intact.     Conjunctiva/sclera: Conjunctivae normal.     Pupils: Pupils are equal, round, and reactive to light.  Cardiovascular:     Rate and Rhythm: Normal rate and regular rhythm.     Pulses: Normal pulses.     Heart sounds: Normal heart sounds.  Pulmonary:     Effort: Pulmonary effort is normal.     Breath sounds: Normal breath sounds.  Abdominal:     General: Abdomen is flat. Bowel sounds are normal.     Palpations: Abdomen is soft.  Musculoskeletal:        General: Normal range of motion.     Cervical back: Normal range of motion and neck supple.  Skin:    General: Skin is warm.     Capillary Refill: Capillary refill takes less than 2 seconds.  Neurological:     Mental Status: She is alert and oriented to person, place, and time.     Comments: Subjective numbness right arm  Psychiatric:        Mood and Affect: Mood normal.        Behavior: Behavior normal.     ED Results / Procedures / Treatments   Labs (all labs ordered are listed, but only abnormal results are displayed) Labs Reviewed  COMPREHENSIVE METABOLIC PANEL - Abnormal; Notable for the following components:      Result Value   Glucose, Bld 152 (*)    All other components within normal limits  URINALYSIS, ROUTINE W REFLEX MICROSCOPIC - Abnormal; Notable for the following components:   APPearance CLOUDY (*)    All other components within normal limits  TSH - Abnormal; Notable for the following components:   TSH 0.028 (*)    All other components within normal limits  I-STAT CHEM 8, ED - Abnormal; Notable for the following components:   Glucose, Bld 153 (*)    All other components within normal limits  CBG MONITORING, ED - Abnormal; Notable for the following components:   Glucose-Capillary 136 (*)    All other components within normal limits  ETHANOL  PROTIME-INR  APTT  CBC  DIFFERENTIAL  RAPID URINE DRUG SCREEN, HOSP PERFORMED    EKG EKG  Interpretation Date/Time:  Wednesday June 06 2023 10:18:15 EDT Ventricular Rate:  89 PR Interval:  155 QRS Duration:  77 QT Interval:  367 QTC Calculation: 447 R Axis:   61  Text Interpretation: Sinus rhythm Abnormal R-wave progression, early transition Borderline T wave abnormalities No significant change since last tracing Confirmed by Jacalyn Lefevre [phone]) on 06/06/2023 10:21:13 AM  Radiology MR ANGIO HEAD WO CONTRAST Result Date: 06/06/2023 CLINICAL DATA:  Stroke, follow-up. Right-sided weakness and numbness.  EXAM: MRA HEAD WITHOUT CONTRAST TECHNIQUE: Angiographic images of the Circle of Willis were acquired using MRA technique without intravenous contrast. COMPARISON:  CT head without contrast 06/06/2023. MR head without contrast 06/06/2023. FINDINGS: Anterior circulation: The internal carotid arteries are within normal limits from the high cervical segments through the ICA termini. The A1 and M1 segments are normal. The anterior communicating artery is patent. The MCA bifurcations are within normal limits. The ACA and MCA branch vessels are normal. No aneurysm is present. Posterior circulation: The vertebral arteries are codominant. The PICA origins are visualized and normal bilaterally. Prominent AICA vessels are patent bilaterally. Superior cerebellar arteries are patent. The vertebrobasilar junction and basilar artery are normal. Both posterior cerebral arteries originate from the basilar tip. The PCA branch vessels are normal bilaterally. No aneurysm is present. Anatomic variants: None IMPRESSION: Normal MRA Circle of Willis without significant proximal stenosis, aneurysm, or branch vessel occlusion. Electronically Signed   By: Marin Roberts M.D.   On: 06/06/2023 11:10   MR BRAIN WO CONTRAST Result Date: 06/06/2023 CLINICAL DATA:  Stroke, follow-up.  Right-sided weakness. EXAM: MRI HEAD WITHOUT CONTRAST TECHNIQUE: Multiplanar, multiecho pulse sequences of the brain and surrounding  structures were obtained without intravenous contrast. COMPARISON:  CT head without contrast 06/06/23. MR head without contrast 10/31/2021 FINDINGS: Brain: No acute infarct, hemorrhage, or mass lesion is present. No significant white matter lesions are present. Deep brain nuclei are within normal limits. The ventricles are of normal size. No significant extraaxial fluid collection is present. The brainstem and cerebellum are within normal limits. The internal auditory canals are within normal limits. Midline structures are within normal limits. Vascular: Flow is present in the major intracranial arteries. Skull and upper cervical spine: Mild degenerative changes are present at C3-4, stable. Craniocervical junction is normal. Marrow signal is normal. Sinuses/Orbits: The paranasal sinuses and mastoid air cells are clear. The globes and orbits are within normal limits. IMPRESSION: Normal MRI appearance of the brain. No acute or focal lesion to explain the patient's symptoms. Electronically Signed   By: Marin Roberts M.D.   On: 06/06/2023 11:03   CT HEAD CODE STROKE WO CONTRAST Result Date: 06/06/2023 CLINICAL DATA:  Code stroke. Provided history: Neuro deficit, acute, stroke suspected. Additional history provided: Right-sided weakness/numbness. EXAM: CT HEAD WITHOUT CONTRAST TECHNIQUE: Contiguous axial images were obtained from the base of the skull through the vertex without intravenous contrast. RADIATION DOSE REDUCTION: This exam was performed according to the departmental dose-optimization program which includes automated exposure control, adjustment of the mA and/or kV according to patient size and/or use of iterative reconstruction technique. COMPARISON:  Brain MRI 10/31/2021. FINDINGS: Brain: Cerebral volume is normal. There is no acute intracranial hemorrhage. No demarcated cortical infarct. No extra-axial fluid collection. No evidence of an intracranial mass. No midline shift. Vascular: No hyperdense  vessel. Skull: No calvarial fracture or aggressive osseous lesion. Sinuses/Orbits: No mass or acute finding within the imaged orbits. No significant paranasal sinus disease at the imaged levels. ASPECTS (Alberta Stroke Program Early CT Score) - Ganglionic level infarction (caudate, lentiform nuclei, internal capsule, insula, M1-M3 cortex): 7 - Supraganglionic infarction (M4-M6 cortex): 3 Total score (0-10 with 10 being normal): 10 These results were communicated to Dr. Roda Shutters At 9:39 amon 3/19/2025by text page via the [facility] messaging system. IMPRESSION: No evidence of an acute intracranial abnormality. Electronically Signed   By: Jackey Loge D.O.   On: 06/06/2023 09:40    Procedures Procedures    Medications Ordered in ED Medications  ketorolac (TORADOL) 30 MG/ML injection 30 mg (30 mg Intravenous Given 06/06/23 1139)  sodium chloride 0.9 % bolus 1,000 mL (0 mLs Intravenous Stopped 06/06/23 1425)  metoCLOPramide (REGLAN) injection 10 mg (10 mg Intravenous Given 06/06/23 1139)  diphenhydrAMINE (BENADRYL) injection 25 mg (25 mg Intravenous Given 06/06/23 1140)  LORazepam (ATIVAN) injection 1 mg (1 mg Intravenous Given 06/06/23 1024)  morphine (PF) 4 MG/ML injection 4 mg (4 mg Intravenous Given 06/06/23 1238)    ED Course/ Medical Decision Making/ A&P                                 Medical Decision Making Amount and/or Complexity of Data Reviewed Labs: ordered. Radiology: ordered.  Risk Prescription drug management.   This patient presents to the ED for concern of cva, this involves an extensive number of treatment options, and is a complaint that carries with it a high risk of complications and morbidity.  The differential diagnosis includes cva, tia, migraine, electrolyte abn   Co morbidities that complicate the patient evaluation  anxiety, migraines, chronic pain, gerd, htn, sleep apnea, kidney stones, CVA, Grave's disease s/p thyroid ablation with radioactive iodine and now  hypothyroidism   Additional history obtained:  Additional history obtained from epic chart review External records from outside source obtained and reviewed including EMS report   Lab Tests:  I Ordered, and personally interpreted labs.  The pertinent results include:  cbc nl, cmp nl other than glucose elevated at 152, etoh neg, inr nl; uds neg, TSH low at 0.028   Imaging Studies ordered:  I ordered imaging studies including ct head, mri  I independently visualized and interpreted imaging which showed  CT head: No evidence of an acute intracranial abnormality.  MRI brain: Normal MRI appearance of the brain. No acute or focal lesion to  explain the patient's symptoms.  MRA brain: Normal MRA Circle of Willis without significant proximal stenosis,  aneurysm, or branch vessel occlusion.   I agree with the radiologist interpretation   Cardiac Monitoring:  The patient was maintained on a cardiac monitor.  I personally viewed and interpreted the cardiac monitored which showed an underlying rhythm of: nsr   Medicines ordered and prescription drug management:  I ordered medication including ivfs/toradol/reglan/benadryl/morphine  for sx  Reevaluation of the patient after these medicines showed that the patient improved I have reviewed the patients home medicines and have made adjustments as needed   Test Considered:  Mri/mra   Critical Interventions:  Code stroke   Consultations Obtained:  I requested consultation with the neurologist (Dr. Roda Shutters),  and discussed lab and imaging findings as well as pertinent plan - he saw pt for a code stroke.  MRI neg, so he thinks sx are from a complex migraine.   Problem List / ED Course:  Complex migraine:  headache improved.  Neuro sx have improved. Secondary hyperthyroidism:  pt told to hold her synthroid until she sees her endocrinologist on 3/24.     Reevaluation:  After the interventions noted above, I reevaluated the  patient and found that they have :improved   Social Determinants of Health:  Lives at home   Dispostion:  After consideration of the diagnostic results and the patients response to treatment, I feel that the patent would benefit from discharge with outpatient f/u.    CRITICAL CARE Performed by: Jacalyn Lefevre   Total critical care time: 30 minutes  Critical care time was  exclusive of separately billable procedures and treating other patients.  Critical care was necessary to treat or prevent imminent or life-threatening deterioration.  Critical care was time spent personally by me on the following activities: development of treatment plan with patient and/or surrogate as well as nursing, discussions with consultants, evaluation of patient's response to treatment, examination of patient, obtaining history from patient or surrogate, ordering and performing treatments and interventions, ordering and review of laboratory studies, ordering and review of radiographic studies, pulse oximetry and re-evaluation of patient's condition.         Final Clinical Impression(s) / ED Diagnoses Final diagnoses:  Secondary hyperthyroidism  Other migraine without status migrainosus, not intractable    Rx / DC Orders ED Discharge Orders     None         Jacalyn Lefevre, MD 06/06/23 1426

## 2023-06-08 ENCOUNTER — Telehealth: Payer: Self-pay | Admitting: "Endocrinology

## 2023-06-08 ENCOUNTER — Ambulatory Visit: Payer: BC Managed Care – PPO | Admitting: "Endocrinology

## 2023-06-08 DIAGNOSIS — E890 Postprocedural hypothyroidism: Secondary | ICD-10-CM | POA: Diagnosis not present

## 2023-06-08 NOTE — Telephone Encounter (Signed)
 Noted and shared with Gretchen Portela, LPN.

## 2023-06-08 NOTE — Telephone Encounter (Signed)
 Pt called and asked to be taken off the schedule. States she is no longer going to see Dr Fransico Him.

## 2023-06-11 DIAGNOSIS — I471 Supraventricular tachycardia, unspecified: Secondary | ICD-10-CM | POA: Diagnosis not present

## 2023-06-11 DIAGNOSIS — I10 Essential (primary) hypertension: Secondary | ICD-10-CM | POA: Diagnosis not present

## 2023-06-13 ENCOUNTER — Other Ambulatory Visit: Payer: Self-pay

## 2023-06-13 MED ORDER — LEVOTHYROXINE SODIUM 25 MCG PO TABS
25.0000 ug | ORAL_TABLET | Freq: Every day | ORAL | 0 refills | Status: DC
Start: 2023-06-13 — End: 2023-06-27

## 2023-06-13 MED ORDER — ROSUVASTATIN CALCIUM 20 MG PO TABS: 20.0000 mg | ORAL_TABLET | Freq: Every evening | ORAL | 0 refills | Status: AC

## 2023-06-13 MED ORDER — LEVOTHYROXINE SODIUM 200 MCG PO TABS: 200.0000 ug | ORAL_TABLET | Freq: Every day | ORAL | 0 refills | Status: AC

## 2023-06-19 DIAGNOSIS — M5416 Radiculopathy, lumbar region: Secondary | ICD-10-CM | POA: Diagnosis not present

## 2023-06-19 DIAGNOSIS — Z6841 Body Mass Index (BMI) 40.0 and over, adult: Secondary | ICD-10-CM | POA: Diagnosis not present

## 2023-06-21 ENCOUNTER — Ambulatory Visit: Admitting: Gastroenterology

## 2023-06-27 ENCOUNTER — Ambulatory Visit (INDEPENDENT_AMBULATORY_CARE_PROVIDER_SITE_OTHER): Admitting: Gastroenterology

## 2023-06-27 ENCOUNTER — Encounter: Payer: Self-pay | Admitting: Gastroenterology

## 2023-06-27 VITALS — BP 126/80 | HR 71 | Temp 97.8°F | Ht 67.0 in | Wt 256.6 lb

## 2023-06-27 DIAGNOSIS — K219 Gastro-esophageal reflux disease without esophagitis: Secondary | ICD-10-CM

## 2023-06-27 DIAGNOSIS — K625 Hemorrhage of anus and rectum: Secondary | ICD-10-CM | POA: Insufficient documentation

## 2023-06-27 MED ORDER — OMEPRAZOLE 40 MG PO CPDR
40.0000 mg | DELAYED_RELEASE_CAPSULE | Freq: Two times a day (BID) | ORAL | 3 refills | Status: DC
Start: 2023-06-27 — End: 2023-07-02

## 2023-06-27 NOTE — Patient Instructions (Signed)
 I am calling in a specialized cream to [employer]. They will call you to verify payment. You will need to use this 3 times per day.   We will see you in 4 weeks!  I enjoyed seeing you again today! I value our relationship and want to provide genuine, compassionate, and quality care. You may receive a survey regarding your visit with me, and I welcome your feedback! Thanks so much for taking the time to complete this. I look forward to seeing you again.      Gelene Mink, PhD, ANP-BC [facility]

## 2023-06-27 NOTE — Progress Notes (Signed)
 Gastroenterology Office Note     Primary Care Physician:  Tommie Sams, DO  Primary Gastroenterologist: Dr. Marletta Lor   Chief Complaint   Chief Complaint  Patient presents with   Rectal Bleeding    Patient here today due to having seen bright red blood and blood clots in stools as of late. Would like to discuss having a future banding. Not prepared today.   Gastroesophageal Reflux    Gerd under control on omeprazole 40 mg bid.     History of Present Illness   Teresa Vincent is a 43 y.o. female presenting today with a history of rectal bleeding due to internal hemorrhoids, s/p banding of right posterior and left lateral in 2022. She has not been seen since that time. Other pertinent history include chronic GERD. Last seen in March 2022 for banding.   Presents today with recurrent rectal bleeding after bouts of diarrhea while thyroid levels imbalanced. Bleeding intermittently. If increased BMs will exacerbate. Had thyroid issues. Had clots. Bled from Friday-Wed intermittently. Sharp pain in stomach then have BM and better. Felt cramp move. Bowel movements improved. Adjusted Synthroid.   Ice pick feeling in rectum at times. Feels like lightning in rectum. No pain with defecation. CHRONIC.    Spinal injection tomorrow. Wants to hold off on banding but arrange near future. Declining rectal exam today.   GERD controlled by omeprazole 40 mg BID. Avoiding trigger foods like chipotle. Avoiding spicy foods.   Colonoscopy Nov 2021: non-bleeding internal hemorrhoids, otherwise normal.     Past Medical History:  Diagnosis Date   Abnormal Pap smear of cervix    ANXIETY 08/30/2007   Asthma    Chronic back pain    Claustrophobia    pt requests Versed before surgery.   Complication of anesthesia    hx of waking up during anesthesia   GERD (gastroesophageal reflux disease)    History of kidney stones    Hypertension    HYPOTHYROIDISM, POST-RADIATION 02/08/2009   INSOMNIA  08/30/2007   MIGRAINE HEADACHE 08/30/2007   Neck pain    Palpitations    occasionally couple times a week   Sciatica    Sleep apnea    cpap   Stroke (HCC) 2010   states, no deficits and on no meds now.    Past Surgical History:  Procedure Laterality Date   ABDOMINAL EXPOSURE N/A 05/29/2017   Procedure: ABDOMINAL EXPOSURE;  Surgeon: Chuck Hint, MD;  Location: [facility] OR;  Service: Vascular;  Laterality: N/A;   ABDOMINAL HYSTERECTOMY     ANTERIOR LUMBAR FUSION N/A 05/29/2017   Procedure: Anterior Lumbar Interbody Fusion  - Lumbar five sacral one;  Surgeon: Tia Alert, MD;  Location: [facility] OR;  Service: Neurosurgery;  Laterality: N/A;   BIOPSY  02/17/2020   Procedure: BIOPSY;  Surgeon: Lanelle Bal, DO;  [facility];  Service: Endoscopy;;  gastric   CARPAL TUNNEL RELEASE Bilateral    CHOLECYSTECTOMY     CHOLECYSTECTOMY N/A 12/29/2016   Procedure: LAPAROSCOPIC CHOLECYSTECTOMY;  Surgeon: [name], MD;  Location: [facility] ORS;  Service: General;  Laterality: N/A;   COLONOSCOPY WITH PROPOFOL N/A 02/17/2020   non-bleeding internal hemorrhoids, otherwise normal.   CYSTOSCOPY/URETEROSCOPY/HOLMIUM LASER/STENT PLACEMENT Right 06/25/2019   Procedure: CYSTOSCOPY WITH RIGHT RETROGRADE PYELOGRAM/RIGHT URETEROSCOPY/HOLMIUM LASER APPLICATION RIGHT URETERAL CALCULUS/RIGHT URETERAL STENT PLACEMENT;  Surgeon: Malen Gauze, MD;  Location: [facility] ORS;  Service: Urology;  Laterality: Right;   ESOPHAGOGASTRODUODENOSCOPY (EGD) WITH PROPOFOL N/A  02/17/2020   Procedure: ESOPHAGOGASTRODUODENOSCOPY (EGD) WITH PROPOFOL;  Surgeon: Lanelle Bal, DO;  [facility];  Service: Endoscopy;  Laterality: N/A;   EXTRACORPOREAL SHOCK WAVE LITHOTRIPSY Right 04/10/2019   Procedure: EXTRACORPOREAL SHOCK WAVE LITHOTRIPSY (ESWL);  Surgeon: Noel Christmas, MD;  Location: [facility];  Service: Urology;  Laterality: Right;   LAMINECTOMY WITH POSTERIOR LATERAL ARTHRODESIS  LEVEL 1 N/A 02/28/2018   Procedure: Laminectomy and Foraminotomy Lumbar Five-Sacral One, posterolateral fusion and fixation;  Surgeon: Tia Alert, MD;  Location: [facility] OR;  Service: Neurosurgery;  Laterality: N/A;  posterior   LUMBAR EPIDURAL INJECTION     STERIOD INJECTION N/A 01/10/2017   Procedure: MINOR EXPAREL INJECTION;  Surgeon: [name], MD;  Location: [facility];  Service: General;  Laterality: N/A;   TOTAL ABDOMINAL HYSTERECTOMY W/ BILATERAL SALPINGOOPHORECTOMY N/A    TUBAL LIGATION  2003    Current Outpatient Medications  Medication Sig Dispense Refill   albuterol (VENTOLIN HFA) 108 (90 Base) MCG/ACT inhaler Inhale 1-2 puffs into the lungs every 4 (four) hours as needed for wheezing or shortness of breath. 18 g 3   ALPRAZolam (XANAX) 0.5 MG tablet TAKE 1/2 TO 1 (ONE-HALF TO ONE) TABLET BY MOUTH TWICE DAILY AS NEEDED FOR ANXIETY 30 tablet 3   Cholecalciferol 50 MCG (2000 UT) CAPS Take 1 capsule by mouth daily.     cyclobenzaprine (FLEXERIL) 5 MG tablet Take 5 mg by mouth 3 (three) times daily as needed.     levothyroxine (SYNTHROID) 200 MCG tablet Take 1 tablet (200 mcg total) by mouth daily. 90 tablet 0   lisinopril (ZESTRIL) 20 MG tablet Take 1 tablet (20 mg total) by mouth daily. 90 tablet 2   metoprolol succinate (TOPROL-XL) 50 MG 24 hr tablet Take 1 tablet (50 mg total) by mouth daily. 90 tablet 1   omeprazole (PRILOSEC) 40 MG capsule TAKE 1 CAPSULE BY MOUTH TWICE DAILY BEFORE A MEAL 180 capsule 1   omeprazole (PRILOSEC) 40 MG capsule Take 1 capsule (40 mg total) by mouth 2 (two) times daily before a meal. 180 capsule 3   ondansetron (ZOFRAN-ODT) 4 MG disintegrating tablet Take 1 tablet (4 mg total) by mouth every 8 (eight) hours as needed for nausea or vomiting. 20 tablet 5   oxyCODONE-acetaminophen (PERCOCET) 5-325 MG tablet Take 1 tablet by mouth 2 (two) times daily as needed for moderate pain (pain score 4-6). 60 tablet 0   rosuvastatin (CRESTOR) 20 MG tablet Take 1  tablet (20 mg total) by mouth at bedtime. 90 tablet 0   SUMAtriptan (IMITREX) 100 MG tablet 1 tablet at the first sign of migraine. Additional dose in 2 hours if headache persists or recurs. 10 tablet 1   No current facility-administered medications for this visit.    Allergies as of 06/27/2023 - Review Complete 06/27/2023  Allergen Reaction Noted   Bupivacaine Swelling 02/03/2020   Contrast media [iodinated contrast media] Swelling 02/03/2020   Indomethacin Other (See Comments) 05/27/2020   Adhesive [tape] Other (See Comments) 09/06/2014   Celexa [citalopram hydrobromide] Other (See Comments) 08/09/2012    Family History  Problem Relation Age of Onset   Hypertension Mother    Thyroid disease Neg Hx     Social History   Socioeconomic History   Marital status: Married    Spouse name: Not on file   Number of children: Not on file   Years of education: Not on file   Highest education level: Not on file  Occupational  History   Occupation: Doesn't work outside the home  Tobacco Use   Smoking status: Former    Current packs/day: 0.00    Average packs/day: 1 pack/day for 26.0 years (26.0 ttl pk-yrs)    Types: Cigarettes    Start date: 01/08/1995    Quit date: 01/07/2021    Years since quitting: 2.4   Smokeless tobacco: Former  Building services engineer status: Every Day   Substances: Nicotine, Flavoring  Substance and Sexual Activity   Alcohol use: Yes    Alcohol/week: 0.0 standard drinks of alcohol    Comment: occasional   Drug use: Not Currently    Types: Marijuana    Comment: occasional   Sexual activity: Not Currently    Birth control/protection: Surgical  Other Topics Concern   Not on file  Social History Narrative   Not on file   Social Drivers of Health   Financial Resource Strain: Not on file  Food Insecurity: Not on file  Transportation Needs: Not on file  Physical Activity: Not on file  Stress: Not on file  Social Connections: Not on file  Intimate  Partner Violence: Not on file     Review of Systems   Gen: Denies any fever, chills, fatigue, weight loss, lack of appetite.  CV: Denies chest pain, heart palpitations, peripheral edema, syncope.  Resp: Denies shortness of breath at rest or with exertion. Denies wheezing or cough.  GI: Denies dysphagia or odynophagia. Denies jaundice, hematemesis, fecal incontinence. GU : Denies urinary burning, urinary frequency, urinary hesitancy MS: Denies joint pain, muscle weakness, cramps, or limitation of movement.  Derm: Denies rash, itching, dry skin Psych: Denies depression, anxiety, memory loss, and confusion Heme: Denies bruising, bleeding, and enlarged lymph nodes.   Physical Exam   BP 126/80 (BP Location: Left Arm, Patient Position: Sitting, Cuff Size: Large)   Pulse 71   Temp 97.8 F (36.6 C) (Temporal)   Ht 5\' 7"  (1.702 m)   Wt 256 lb 9.6 oz (116.4 kg)   LMP 02/25/2016   BMI 40.19 kg/m  General:   Alert and oriented. Pleasant and cooperative. Well-nourished and well-developed.  Head:  Normocephalic and atraumatic. Eyes:  Without icterus Abdomen:  +BS, soft, non-tender and non-distended. No HSM noted. No guarding or rebound. No masses appreciated.  Rectal:  Deferred  Msk:  Symmetrical without gross deformities. Normal posture. Extremities:  Without edema. Neurologic:  Alert and  oriented x4;  grossly normal neurologically. Skin:  Intact without significant lesions or rashes. Psych:  Alert and cooperative. Normal mood and affect.   Assessment   DYLYNN KETNER is a 43 y.o. female presenting today with a history of rectal bleeding due to internal hemorrhoids, s/p banding of right posterior and left lateral in 2022. She has not been seen since that time. Other pertinent history include chronic GERD. Last seen in March 2022 for banding.   Rectal bleeding now likely secondary to [facility] in the setting diarrhea exacerbation that has now improved. She has declined rectal exam. She  would be a great candidate or banding in future; however, I suspect may have occult anal fissure as well. Will treat supportively with compounded cream with goal of banding at next appt if improves.   Continue PPI BID as controlling GERD symptoms well.      PLAN    [facility] compounded hemorrhoid cream with lidocaine and diltiazem called into pharmacy, 30g, use TID, refill X 1 PPI refills provided for BID dosing Return in 4-6  weeks  Gelene Mink, PhD, ANP-BC [facility] Gastroenterology

## 2023-06-28 DIAGNOSIS — M5416 Radiculopathy, lumbar region: Secondary | ICD-10-CM | POA: Diagnosis not present

## 2023-07-02 ENCOUNTER — Other Ambulatory Visit: Payer: Self-pay

## 2023-07-02 MED ORDER — OMEPRAZOLE 40 MG PO CPDR: 40.0000 mg | DELAYED_RELEASE_CAPSULE | Freq: Two times a day (BID) | ORAL | 3 refills | Status: AC

## 2023-07-03 DIAGNOSIS — M7711 Lateral epicondylitis, right elbow: Secondary | ICD-10-CM | POA: Diagnosis not present

## 2023-07-03 DIAGNOSIS — M25522 Pain in left elbow: Secondary | ICD-10-CM | POA: Diagnosis not present

## 2023-07-04 ENCOUNTER — Other Ambulatory Visit: Payer: Self-pay | Admitting: Family Medicine

## 2023-07-06 ENCOUNTER — Other Ambulatory Visit: Payer: Self-pay | Admitting: Physical Medicine and Rehabilitation

## 2023-07-09 ENCOUNTER — Encounter: Payer: Self-pay | Admitting: Physical Medicine and Rehabilitation

## 2023-07-09 ENCOUNTER — Other Ambulatory Visit: Payer: Self-pay | Admitting: Physical Medicine and Rehabilitation

## 2023-07-09 MED ORDER — OXYCODONE-ACETAMINOPHEN 5-325 MG PO TABS: 1.0000 | ORAL_TABLET | Freq: Two times a day (BID) | ORAL | 0 refills | Status: DC | PRN

## 2023-07-16 ENCOUNTER — Ambulatory Visit (HOSPITAL_COMMUNITY): Attending: Neurological Surgery

## 2023-07-16 ENCOUNTER — Other Ambulatory Visit: Payer: Self-pay

## 2023-07-16 DIAGNOSIS — M5416 Radiculopathy, lumbar region: Secondary | ICD-10-CM | POA: Diagnosis not present

## 2023-07-16 DIAGNOSIS — M545 Low back pain, unspecified: Secondary | ICD-10-CM | POA: Insufficient documentation

## 2023-07-16 NOTE — Patient Instructions (Signed)

## 2023-07-16 NOTE — Therapy (Signed)
 OUTPATIENT PHYSICAL THERAPY THORACOLUMBAR EVALUATION   Patient Name: Teresa Vincent MRN: [id number] DOB:[birth year], 43 y.o., female Today's Date: 07/16/2023  END OF SESSION:  PT End of Session - 07/16/23 1434     Visit Number 1    Number of Visits 6    Date for PT Re-Evaluation 08/27/23    Authorization Type BC[employer]    PT Start Time 1435    PT Stop Time 1510    PT Time Calculation (min) 35 min    Activity Tolerance Patient tolerated treatment well    Behavior During Therapy [facility] for tasks assessed/performed             Past Medical History:  Diagnosis Date   Abnormal Pap smear of cervix    ANXIETY 08/30/2007   Asthma    Chronic back pain    Claustrophobia    pt requests Versed  before surgery.   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   Past Surgical History:  Procedure Laterality Date   ABDOMINAL EXPOSURE N/A 05/29/2017   Procedure: ABDOMINAL EXPOSURE;  Surgeon: Dannis Dy, MD;  Location: [facility] OR;  Service: Vascular;  Laterality: N/A;   ABDOMINAL HYSTERECTOMY     ANTERIOR LUMBAR FUSION N/A 05/29/2017   Procedure: Anterior Lumbar Interbody Fusion  - Lumbar five sacral one;  Surgeon: Isadora Mar, MD;  Location: [facility] OR;  Service: Neurosurgery;  Laterality: N/A;   BIOPSY  02/17/2020   Procedure: BIOPSY;  Surgeon: Vinetta Greening, DO;  [facility];  Service: Endoscopy;;  gastric   CARPAL TUNNEL RELEASE Bilateral    CHOLECYSTECTOMY     CHOLECYSTECTOMY N/A 12/29/2016   Procedure: LAPAROSCOPIC CHOLECYSTECTOMY;  Surgeon: Awilda Bogus, MD;  Location: [facility];  Service: General;   Laterality: N/A;   COLONOSCOPY WITH PROPOFOL  N/A 02/17/2020   non-bleeding internal hemorrhoids, otherwise normal.   CYSTOSCOPY/URETEROSCOPY/HOLMIUM LASER/STENT PLACEMENT Right 06/25/2019   Procedure: CYSTOSCOPY WITH RIGHT RETROGRADE PYELOGRAM/RIGHT URETEROSCOPY/HOLMIUM LASER APPLICATION RIGHT URETERAL CALCULUS/RIGHT URETERAL STENT PLACEMENT;  Surgeon: Marco Severs, MD;  Location: [facility];  Service: Urology;  Laterality: Right;   ESOPHAGOGASTRODUODENOSCOPY (EGD) WITH PROPOFOL  N/A 02/17/2020   Procedure: ESOPHAGOGASTRODUODENOSCOPY (EGD) WITH PROPOFOL ;  Surgeon: Vinetta Greening, DO;  [facility];  Service: Endoscopy;  Laterality: N/A;   EXTRACORPOREAL SHOCK WAVE LITHOTRIPSY Right 04/10/2019   Procedure: EXTRACORPOREAL SHOCK WAVE LITHOTRIPSY (ESWL);  Surgeon: Roxane Copp, MD;  Location: [facility];  Service: Urology;  Laterality: Right;   LAMINECTOMY WITH POSTERIOR LATERAL ARTHRODESIS LEVEL 1 N/A 02/28/2018   Procedure: Laminectomy and Foraminotomy Lumbar Five-Sacral One, posterolateral fusion and fixation;  Surgeon: Isadora Mar, MD;  Location: [facility] OR;  Service: Neurosurgery;  Laterality: N/A;  posterior   LUMBAR EPIDURAL INJECTION     STERIOD INJECTION N/A 01/10/2017   Procedure: MINOR EXPAREL  INJECTION;  Surgeon: Awilda Bogus, MD;  Location: [facility];  Service: General;  Laterality: N/A;   TOTAL ABDOMINAL HYSTERECTOMY W/ BILATERAL SALPINGOOPHORECTOMY N/A    TUBAL LIGATION  2003  Patient Active Problem List   Diagnosis Date Noted   Rectal bleeding 06/27/2023   Chronic left-sided low back pain 04/04/2023   Morbid obesity (HCC) 03/09/2023   Non-adherence to medical treatment 03/09/2023   SVT (supraventricular tachycardia) (HCC) 03/21/2022   OSA (obstructive sleep apnea) 11/11/2021   TIA (transient ischemic attack) 11/01/2021   Urinary incontinence 08/29/2021   Chronic pain syndrome 02/28/2021   S/P lumbar fusion 02/28/2018   Prediabetes  12/04/2017   Mixed hyperlipidemia 10/11/2016   Essential hypertension, benign 12/30/2012   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   Anxiety state 08/30/2007   Migraine headache 08/30/2007   Insomnia 08/30/2007    PCP: Kathleen Papa  REFERRING PROVIDER: Joaquin Mulberry, MD  REFERRING DIAG: M54.16 (ICD-10-CM) - Radiculopathy, lumbar region  Rationale for Evaluation and Treatment: Rehabilitation  THERAPY DIAG:  Radiculopathy, lumbar region - Plan: PT plan of care cert/re-cert  Low back pain, unspecified back pain laterality, unspecified chronicity, unspecified whether sciatica present - Plan: PT plan of care cert/re-cert  ONSET DATE: chronic  SUBJECTIVE:                                                                                                                                                                                           SUBJECTIVE STATEMENT: Pain down both legs; right down to ankle; left leg down to knees; back pain has increased. Has had multiple injections  Returned to [employer].  Had another epidural injection at L3 and "felt amazing" for about 3 weeks and then stopped working.  Referred to PT for dry needling.  Sees a pain management MD PERTINENT HISTORY:  2019 2 lumbar surgeries; one in March and one in December per [facility]  PAIN:  Are you having pain? Yes: NPRS scale: 7.5/10; lowest 3/10; worst 10/10  Pain location: low back and down legs Pain description: aching, burning, stabbing Aggravating factors: anything Relieving factors: pain medication, TENS  PRECAUTIONS: None  RED FLAGS: None   WEIGHT BEARING RESTRICTIONS: No  FALLS:  Has patient fallen in last 6 months? No   OCCUPATION: plant lead at the [employer]  PLOF: Independent  PATIENT GOALS: reduce my pain  NEXT MD VISIT: PRN  OBJECTIVE:  Note: Objective measures were completed at Evaluation unless otherwise noted.  DIAGNOSTIC FINDINGS:  CLINICAL DATA:  Two  prior lumbar spine surgeries. Chronic low back pain.   EXAM: MRI LUMBAR SPINE WITHOUT CONTRAST   TECHNIQUE: Multiplanar, multisequence MR imaging of the lumbar spine was performed. No intravenous contrast was administered.   COMPARISON:  Lumbar myelogram 09/09/2018. MRI of lumbar spine 12/16/2015   FINDINGS: Segmentation: 5 non rib-bearing  lumbar type vertebral bodies are present. The lowest fully formed vertebral body is L5.   Alignment: Mild grade 1 retrolisthesis at L3-4 has increased slightly, measuring up to 3 mm. Grade 1 anterolisthesis at L5-S1 is fused in stable. No other significant listhesis is present. Lumbar lordosis is stable. Mild leftward curvature is again noted at L3-4.   Vertebrae: Chronic fatty infiltration at L5 and S1 has progressed. Marrow signal and vertebral body heights are otherwise normal.   Conus medullaris and cauda equina: Conus extends to the L1-2 level. Conus and cauda equina appear normal.   Paraspinal and other soft tissues: Limited imaging the abdomen is unremarkable. There is no significant adenopathy. No solid organ lesions are present.   Disc levels:   T12-L1: Normal disc signal and height is present. No focal protrusion or stenosis is present.   L1-2: Normal disc signal and height is present. No focal protrusion or stenosis is present.   L2-3: Normal disc signal and height is present. No focal protrusion or stenosis is present.   L3-4: A progressive broad-based disc protrusion is present. Moderate left and mild right foraminal stenosis has progressed.   L5-S1: Wide laminectomy is noted. The central canal and foramina are decompressed.   IMPRESSION: 1. Progressive broad-based disc protrusion at L3-4 with moderate left and mild right foraminal stenosis. 2. Wide laminectomy at L5-S1 with stable grade 1 anterolisthesis. The central canal and foramina are decompressed. 3. No other significant disc disease or stenosis at the  other levels.  PATIENT SURVEYS:  Modified Oswestry 21/50 42%   COGNITION: Overall cognitive status: Within functional limits for tasks assessed     SENSATION: WFL   POSTURE: increased lumbar lordosis and flexed trunk   PALPATION: Tender bilateral lumbar spine and left side paraspinals  LUMBAR ROM:   AROM eval  Flexion To 3" past distal knee cap  Extension "Feels better" 50% available  Right lateral flexion Fingertips 3" above knee joint line  Left lateral flexion Fingertips 4" below greater troch  Right rotation 50% available  Left rotation 15% available   (Blank rows = not tested)  LOWER EXTREMITY ROM:     Active  Right eval Left eval  Hip flexion    Hip extension    Hip abduction    Hip adduction    Hip internal rotation    Hip external rotation    Knee flexion    Knee extension    Ankle dorsiflexion    Ankle plantarflexion    Ankle inversion    Ankle eversion     (Blank rows = not tested)  LOWER EXTREMITY MMT:    MMT Right eval Left eval  Hip flexion 4+ 3-  Hip extension 4+ 3-  Hip abduction    Hip adduction    Hip internal rotation    Hip external rotation    Knee flexion 5 4+  Knee extension 5 5  Ankle dorsiflexion 5 4  Ankle plantarflexion    Ankle inversion    Ankle eversion     (Blank rows = not tested)    FUNCTIONAL TESTS:  5 times sit to stand: 12.88 sec no UE assist  GAIT: Distance walked: 50 ft in clinic Assistive device utilized: None Level of assistance: Modified independence Comments: slight antalgic gait and decreased gait speed  TREATMENT DATE: 07/16/23 physical therapy evaluation and HEP instruction  PATIENT EDUCATION:  Education details: Patient educated on exam findings, POC, scope of PT, HEP, and what to expect next visit; dry needling instructions. Person educated: Patient Education  method: Explanation, Demonstration, and Handouts Education comprehension: verbalized understanding, returned demonstration, verbal cues required, and tactile cues required  HOME EXERCISE PROGRAM: Access Code: [email or web address] URL: [email or web address] Date: 07/16/2023 Prepared by: [facility] - Rehab  Exercises - Lying Prone  - 3 x daily - 7 x weekly - 1 sets - 1 reps - Prone Off Center Lumbar Spine  - 3 x daily - 7 x weekly - 1 sets - 1 reps  ASSESSMENT:  CLINICAL IMPRESSION: Patient is a 43 y.o. female who was seen today for physical therapy evaluation and treatment for M54.16 (ICD-10-CM) - Radiculopathy, lumbar region. Patient demonstrates muscle weakness, reduced ROM, and fascial restrictions which are likely contributing to symptoms of pain and are negatively impacting patient ability to perform ADLs and functional mobility tasks. Patient will benefit from skilled physical therapy services to address these deficits to reduce pain and improve level of function with ADLs and functional mobility tasks.   OBJECTIVE IMPAIRMENTS: Abnormal gait, decreased activity tolerance, decreased endurance, decreased mobility, decreased ROM, decreased strength, impaired perceived functional ability, and pain.   ACTIVITY LIMITATIONS: carrying, lifting, bending, sitting, standing, squatting, transfers, bed mobility, and locomotion level  PARTICIPATION LIMITATIONS: meal prep, cleaning, laundry, driving, shopping, community activity, occupation, and yard work  [facility] POTENTIAL: Good  CLINICAL DECISION MAKING: Evolving/moderate complexity  EVALUATION COMPLEXITY: Moderate   GOALS: Goals reviewed with patient? No  SHORT TERM GOALS: Target date: 08/06/2023  patient will be independent with initial HEP  Baseline: Goal status: INITIAL  2.  Patient will report 50% improvement overall  Baseline:  Goal status: INITIAL   LONG TERM GOALS: Target date: 08/27/2023  Patient will be independent in self  management strategies to improve quality of life and functional outcomes.   Baseline:  Goal status: INITIAL  2.  Patient will report 75% improvement overall  Baseline:  Goal status: INITIAL  3.  Patient will improve Modified Oswestry score by 6 points to demonstrate improved perceived function  Baseline: 21/50 42% Goal status: INITIAL  4.   Patient will increase bilateral leg MMT's to 4+ to 5/5 to allow navigation of steps without gait deviation or loss of balance  Baseline: see above Goal status: INITIAL Goal status: INITIAL  PLAN:  PT FREQUENCY: 1x/week  PT DURATION: 6 weeks  PLANNED INTERVENTIONS: 97164- PT Re-evaluation, 97110-Therapeutic exercises, 97530- Therapeutic activity, 97112- Neuromuscular re-education, 97535- Self Care, 16109- Manual therapy, [phone]- Gait training, [phone]- Orthotic Fit/training, [phone]- Canalith repositioning, [id number]- Aquatic Therapy, [phone]- Splinting, Patient/Family education, Balance training, Stair training, Taping, Dry Needling, Joint mobilization, Joint manipulation, Spinal manipulation, Spinal mobilization, Scar mobilization, and DME instructions. Aaron Aas  PLAN FOR NEXT SESSION: Review HEP and goals; try DN with e-stim; lumbar mobility and strength  3:26 PM, 07/16/23 Cassady Turano Small Layken Beg MPT [facility] [place] [phone] Ph:[phone]    Managed Medicaid Authorization Request  Visit Dx Codes: M54.16, M54.50  Functional Tool Score: Modified Oswestry 21/50  For all possible CPT codes, reference the Planned Interventions line above.     Check all conditions that are expected to impact treatment: {Conditions expected to impact treatment:None of these apply   If treatment provided at initial evaluation, no treatment charged due to lack of authorization.

## 2023-07-20 DIAGNOSIS — E890 Postprocedural hypothyroidism: Secondary | ICD-10-CM | POA: Diagnosis not present

## 2023-07-25 ENCOUNTER — Ambulatory Visit: Admitting: Gastroenterology

## 2023-07-26 ENCOUNTER — Ambulatory Visit (HOSPITAL_COMMUNITY): Attending: Neurological Surgery

## 2023-07-26 DIAGNOSIS — M5416 Radiculopathy, lumbar region: Secondary | ICD-10-CM | POA: Insufficient documentation

## 2023-07-26 DIAGNOSIS — M545 Low back pain, unspecified: Secondary | ICD-10-CM | POA: Diagnosis not present

## 2023-07-26 NOTE — Therapy (Signed)
 OUTPATIENT PHYSICAL THERAPY THORACOLUMBAR TREATMENT   Patient Name: Teresa Vincent MRN: [id number] DOB:13-Jan-1981, 43 y.o., female Today's Date: 07/26/2023  END OF SESSION:  PT End of Session - 07/26/23 1100     Visit Number 2    Number of Visits 6    Date for PT Re-Evaluation 08/27/23    Authorization Type [employer]    PT Start Time 1020    PT Stop Time 1055    PT Time Calculation (min) 35 min    Activity Tolerance Patient tolerated treatment well    Behavior During Therapy WFL for tasks assessed/performed             Past Medical History:  Diagnosis Date   Abnormal Pap smear of cervix    ANXIETY 08/30/2007   Asthma    Chronic back pain    Claustrophobia    pt requests Versed  before surgery.   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   Past Surgical History:  Procedure Laterality Date   ABDOMINAL EXPOSURE N/A 05/29/2017   Procedure: ABDOMINAL EXPOSURE;  Surgeon: Dannis Dy, MD;  Location: [facility] OR;  Service: Vascular;  Laterality: N/A;   ABDOMINAL HYSTERECTOMY     ANTERIOR LUMBAR FUSION N/A 05/29/2017   Procedure: Anterior Lumbar Interbody Fusion  - Lumbar five sacral one;  Surgeon: Isadora Mar, MD;  [facility];  Service: Neurosurgery;  Laterality: N/A;   BIOPSY  02/17/2020   Procedure: BIOPSY;  Surgeon: Vinetta Greening, DO;  Location: [facility];  Service: Endoscopy;;  gastric   CARPAL TUNNEL RELEASE Bilateral    CHOLECYSTECTOMY     CHOLECYSTECTOMY N/A 12/29/2016   Procedure: LAPAROSCOPIC CHOLECYSTECTOMY;  Surgeon: Awilda Bogus, MD;  Location: [facility] ORS;  Service: General;   Laterality: N/A;   COLONOSCOPY WITH PROPOFOL  N/A 02/17/2020   non-bleeding internal hemorrhoids, otherwise normal.   CYSTOSCOPY/URETEROSCOPY/HOLMIUM LASER/STENT PLACEMENT Right 06/25/2019   Procedure: CYSTOSCOPY WITH RIGHT RETROGRADE PYELOGRAM/RIGHT URETEROSCOPY/HOLMIUM LASER APPLICATION RIGHT URETERAL CALCULUS/RIGHT URETERAL STENT PLACEMENT;  Surgeon: Marco Severs, MD;  Location: [facility];  Service: Urology;  Laterality: Right;   ESOPHAGOGASTRODUODENOSCOPY (EGD) WITH PROPOFOL  N/A 02/17/2020   Procedure: ESOPHAGOGASTRODUODENOSCOPY (EGD) WITH PROPOFOL ;  Surgeon: Vinetta Greening, DO;  Location: [facility];  Service: Endoscopy;  Laterality: N/A;   EXTRACORPOREAL SHOCK WAVE LITHOTRIPSY Right 04/10/2019   Procedure: EXTRACORPOREAL SHOCK WAVE LITHOTRIPSY (ESWL);  Surgeon: Roxane Copp, MD;  Location: [facility];  Service: Urology;  Laterality: Right;   LAMINECTOMY WITH POSTERIOR LATERAL ARTHRODESIS LEVEL 1 N/A 02/28/2018   Procedure: Laminectomy and Foraminotomy Lumbar Five-Sacral One, posterolateral fusion and fixation;  Surgeon: Isadora Mar, MD;  Location: [facility] OR;  Service: Neurosurgery;  Laterality: N/A;  posterior   LUMBAR EPIDURAL INJECTION     STERIOD INJECTION N/A 01/10/2017   Procedure: MINOR EXPAREL  INJECTION;  Surgeon: Awilda Bogus, MD;  Location: [facility];  Service: General;  Laterality: N/A;   TOTAL ABDOMINAL HYSTERECTOMY W/ BILATERAL SALPINGOOPHORECTOMY N/A    TUBAL LIGATION  2003  Patient Active Problem List   Diagnosis Date Noted   Rectal bleeding 06/27/2023   Chronic left-sided low back pain 04/04/2023   Morbid obesity (HCC) 03/09/2023   Non-adherence to medical treatment 03/09/2023   SVT (supraventricular tachycardia) (HCC) 03/21/2022   OSA (obstructive sleep apnea) 11/11/2021   TIA (transient ischemic attack) 11/01/2021   Urinary incontinence 08/29/2021   Chronic pain syndrome 02/28/2021   S/P lumbar fusion 02/28/2018   Prediabetes  12/04/2017   Mixed hyperlipidemia 10/11/2016   Essential hypertension, benign 12/30/2012   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   Anxiety state 08/30/2007   Migraine headache 08/30/2007   Insomnia 08/30/2007    PCP: Kathleen Papa  REFERRING PROVIDER: Joaquin Mulberry, MD  REFERRING DIAG: M54.16 (ICD-10-CM) - Radiculopathy, lumbar region  Rationale for Evaluation and Treatment: Rehabilitation  THERAPY DIAG:  Radiculopathy, lumbar region  Low back pain, unspecified back pain laterality, unspecified chronicity, unspecified whether sciatica present  ONSET DATE: chronic  SUBJECTIVE:                                                                                                                                                                                           SUBJECTIVE STATEMENT: Patient reports she is very anxious about trying the dry needling with e-stim.  Pain 8/10 left side of low back today   EVAL: Pain down both legs; right down to ankle; left leg down to knees; back pain has increased. Has had multiple injections  Returned to [employer].  Had another epidural injection at L3 and "felt amazing" for about 3 weeks and then stopped working.  Referred to PT for dry needling.  Sees a pain management MD PERTINENT HISTORY:  2019 2 lumbar surgeries; one in March and one in December per [facility]  PAIN:  Are you having pain? Yes: NPRS scale: 7.5/10; lowest 3/10; worst 10/10  Pain location: low back and down legs Pain description: aching, burning, stabbing Aggravating factors: anything Relieving factors: pain medication, TENS  PRECAUTIONS: None  RED FLAGS: None   WEIGHT BEARING RESTRICTIONS: No  FALLS:  Has patient fallen in last 6 months? 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  DIAGNOSTIC FINDINGS:  CLINICAL DATA:  Two prior lumbar spine surgeries. Chronic low back pain.   EXAM: MRI LUMBAR SPINE WITHOUT CONTRAST   TECHNIQUE: Multiplanar, multisequence MR imaging of the lumbar spine was performed. No intravenous contrast was administered.   COMPARISON:  Lumbar myelogram 09/09/2018. MRI  of lumbar spine 12/16/2015   FINDINGS: Segmentation: 5 non rib-bearing lumbar type vertebral bodies are present. The lowest fully formed vertebral body is L5.   Alignment: Mild grade 1 retrolisthesis at L3-4 has increased slightly, measuring up to 3 mm. Grade 1 anterolisthesis at L5-S1 is fused in stable. No other significant listhesis is present. Lumbar lordosis is stable. Mild leftward curvature is again noted at L3-4.   Vertebrae: Chronic fatty infiltration at L5 and S1 has progressed. Marrow signal and vertebral body heights are otherwise normal.   Conus medullaris and cauda equina: Conus extends to the L1-2 level. Conus and cauda equina appear normal.   Paraspinal and other soft tissues: Limited imaging the abdomen is unremarkable. There is no significant adenopathy. No solid organ lesions are present.   Disc levels:   T12-L1: Normal disc signal and height is present. No focal protrusion or stenosis is present.   L1-2: Normal disc signal and height is present. No focal protrusion or stenosis is present.   L2-3: Normal disc signal and height is present. No focal protrusion or stenosis is present.   L3-4: A progressive broad-based disc protrusion is present. Moderate left and mild right foraminal stenosis has progressed.   L5-S1: Wide laminectomy is noted. The central canal and foramina are decompressed.   IMPRESSION: 1. Progressive broad-based disc protrusion at L3-4 with moderate left and mild right foraminal stenosis. 2. Wide laminectomy at L5-S1 with stable grade 1 anterolisthesis. The central canal and foramina are decompressed. 3. No  other significant disc disease or stenosis at the other levels.  PATIENT SURVEYS:  Modified Oswestry 21/50 42%   COGNITION: Overall cognitive status: Within functional limits for tasks assessed     SENSATION: WFL   POSTURE: increased lumbar lordosis and flexed trunk   PALPATION: Tender bilateral lumbar spine and left side paraspinals  LUMBAR ROM:   AROM eval  Flexion To 3" past distal knee cap  Extension "Feels better" 50% available  Right lateral flexion Fingertips 3" above knee joint line  Left lateral flexion Fingertips 4" below greater troch  Right rotation 50% available  Left rotation 15% available   (Blank rows = not tested)  LOWER EXTREMITY ROM:     Active  Right eval Left eval  Hip flexion    Hip extension    Hip abduction    Hip adduction    Hip internal rotation    Hip external rotation    Knee flexion    Knee extension    Ankle dorsiflexion    Ankle plantarflexion    Ankle inversion    Ankle eversion     (Blank rows = not tested)  LOWER EXTREMITY MMT:    MMT Right eval Left eval  Hip flexion 4+ 3-  Hip extension 4+ 3-  Hip abduction    Hip adduction    Hip internal rotation    Hip external rotation    Knee flexion 5 4+  Knee extension 5 5  Ankle dorsiflexion 5 4  Ankle plantarflexion    Ankle inversion    Ankle eversion     (Blank rows = not tested)    FUNCTIONAL TESTS:  5 times sit to stand: 12.88 sec no UE assist  GAIT: Distance walked: 50 ft in clinic Assistive device utilized: None Level of assistance: Modified independence Comments: slight antalgic gait and decreased gait speed  TREATMENT DATE:  07/26/23 Review of HEP and goals Review of dry needling patient instructions Trigger Point Dry Needling  Initial Treatment:  Pt instructed on Dry Needling rational, procedures, and possible side effects. Pt instructed to expect mild to moderate muscle soreness later in the day and/or into the next day.  Pt instructed in  methods to reduce muscle soreness. Pt instructed to continue prescribed HEP. Because Dry Needling was performed over or adjacent to a lung field, pt was educated on S/S of pneumothorax and to seek immediate medical attention should they occur.  Patient was educated on signs and symptoms of infection and other risk factors and advised to seek medical attention should they occur.  Patient verbalized understanding of these instructions and education.   Patient Verbal Consent Given: Yes Education Handout Provided: Previously Provided Muscles Treated: left multifidus lumborum Electrical Stimulation Performed: Yes, Parameters: micro frequency 100, intensity as tolerated x 10 min Treatment Response/Outcome: decreased pain during treatment     07/16/23 physical therapy evaluation and HEP instruction                                                                                                                                 PATIENT EDUCATION:  Education details: Patient educated on exam findings, POC, scope of PT, HEP, and what to expect next visit; dry needling instructions. Person educated: Patient Education method: Explanation, Demonstration, and Handouts Education comprehension: verbalized understanding, returned demonstration, verbal cues required, and tactile cues required  HOME EXERCISE PROGRAM: Access Code: [email or web address] URL: [email or web address] Date: 07/16/2023 Prepared by: [facility]  Exercises - Lying Prone  - 3 x daily - 7 x weekly - 1 sets - 1 reps - Prone Off Center Lumbar Spine  - 3 x daily - 7 x weekly - 1 sets - 1 reps  ASSESSMENT:  CLINICAL IMPRESSION: Today's treatment started with a review of HEP and goals.  Trial today of dry needling with estim.  2 needles one lead to left side multifidus lumborum.  Patient very anxious about needling initially but this improves during the treatment.  Patient reports decreased pain and the ability to tolerate increased  intensity as the treatment progressed.  Discussed increasing needles to 4 to cover a larger treatment area next visit.  Patient will benefit from continued skilled therapy services to address deficits and promote return to optimal function.       Eval: Patient is a 43 y.o. female who was seen today for physical therapy evaluation and treatment for M54.16 (ICD-10-CM) - Radiculopathy, lumbar region. Patient demonstrates muscle weakness, reduced ROM, and fascial restrictions which are likely contributing to symptoms of pain and are negatively impacting patient ability to perform ADLs and functional mobility tasks. Patient will benefit from skilled physical therapy services to address these deficits to reduce pain and improve level of function with ADLs and functional mobility tasks.   OBJECTIVE IMPAIRMENTS: Abnormal gait, decreased activity tolerance, decreased endurance, decreased mobility, decreased ROM, decreased strength, impaired perceived functional ability, and pain.   ACTIVITY LIMITATIONS: carrying, lifting, bending, sitting, standing, squatting, transfers, bed  mobility, and locomotion level  PARTICIPATION LIMITATIONS: meal prep, cleaning, laundry, driving, shopping, community activity, occupation, and yard work  [facility] POTENTIAL: Good  CLINICAL DECISION MAKING: Evolving/moderate complexity  EVALUATION COMPLEXITY: Moderate   GOALS: Goals reviewed with patient? No  SHORT TERM GOALS: Target date: 08/06/2023  patient will be independent with initial HEP  Baseline: Goal status: in progress  2.  Patient will report 50% improvement overall  Baseline:  Goal status: in progress   LONG TERM GOALS: Target date: 08/27/2023  Patient will be independent in self management strategies to improve quality of life and functional outcomes.   Baseline:  Goal status: in progress  2.  Patient will report 75% improvement overall  Baseline:  Goal status: in progress  3.  Patient will  improve Modified Oswestry score by 6 points to demonstrate improved perceived function  Baseline: 21/50 42% Goal status: in progress  4.   Patient will increase bilateral leg MMT's to 4+ to 5/5 to allow navigation of steps without gait deviation or loss of balance  Baseline: see above Goal status: in progress   PLAN:  PT FREQUENCY: 1x/week  PT DURATION: 6 weeks  PLANNED INTERVENTIONS: 97164- PT Re-evaluation, 97110-Therapeutic exercises, 97530- Therapeutic activity, 97112- Neuromuscular re-education, 97535- Self Care, 13086- Manual therapy, [phone]- Gait training, [phone]- Orthotic Fit/training, [phone]- Canalith repositioning, [id number]- Aquatic Therapy, [phone]- Splinting, Patient/Family education, Balance training, Stair training, Taping, Dry Needling, Joint mobilization, Joint manipulation, Spinal manipulation, Spinal mobilization, Scar mobilization, and DME instructions. Aaron Aas  PLAN FOR NEXT SESSION:  try DN with e-stim; lumbar mobility and strength  11:12 AM, 07/26/23 Deneice Wack Small Derrien Anschutz MPT [place] [facility] [place] [phone] Ph:[phone]

## 2023-07-30 ENCOUNTER — Other Ambulatory Visit: Payer: Self-pay | Admitting: Nurse Practitioner

## 2023-07-30 MED ORDER — SUMATRIPTAN SUCCINATE 100 MG PO TABS: ORAL_TABLET | ORAL | 0 refills | Status: AC

## 2023-07-30 NOTE — Telephone Encounter (Signed)
 Copied from CRM [id number]. Topic: Clinical - Medication Question >> Jul 30, 2023 12:00 PM Lemuneika B wrote: Reason for CRM: pt called in because she was told to write a message to Dr.Hoskins getting a 3 month supply on her headache medication but the pt can't find Dr.hoskins on mychart to message her . If there is anyway that Dr.Hoskins and call the patient if possible .

## 2023-08-02 ENCOUNTER — Ambulatory Visit (HOSPITAL_COMMUNITY)

## 2023-08-06 ENCOUNTER — Other Ambulatory Visit (HOSPITAL_COMMUNITY): Payer: Self-pay

## 2023-08-06 ENCOUNTER — Telehealth: Payer: Self-pay | Admitting: Pharmacy Technician

## 2023-08-06 ENCOUNTER — Ambulatory Visit: Payer: BC Managed Care – PPO | Admitting: Physical Medicine and Rehabilitation

## 2023-08-06 NOTE — Telephone Encounter (Signed)
 Pharmacy Patient Advocate Encounter   Received notification from CoverMyMeds that prior authorization for SUMAtriptan  Succinate 100MG  tablets is required/requested.   Insurance verification completed.   The patient is insured through [facility] .   Per test claim: Refill too soon. PA is not needed at this time. Medication was filled 08/04/2023. Next eligible fill date is 08/29/2023.  Insurance covers 12 tablets in 25 days.

## 2023-08-07 ENCOUNTER — Encounter: Attending: Physical Medicine and Rehabilitation | Admitting: Physical Medicine and Rehabilitation

## 2023-08-07 ENCOUNTER — Encounter: Payer: Self-pay | Admitting: Physical Medicine and Rehabilitation

## 2023-08-07 VITALS — BP 118/77 | HR 73 | Ht 67.0 in | Wt 260.0 lb

## 2023-08-07 DIAGNOSIS — Z79891 Long term (current) use of opiate analgesic: Secondary | ICD-10-CM | POA: Diagnosis not present

## 2023-08-07 DIAGNOSIS — M48062 Spinal stenosis, lumbar region with neurogenic claudication: Secondary | ICD-10-CM | POA: Insufficient documentation

## 2023-08-07 DIAGNOSIS — Z5181 Encounter for therapeutic drug level monitoring: Secondary | ICD-10-CM | POA: Diagnosis not present

## 2023-08-07 DIAGNOSIS — G894 Chronic pain syndrome: Secondary | ICD-10-CM | POA: Insufficient documentation

## 2023-08-07 MED ORDER — OXYCODONE-ACETAMINOPHEN 5-325 MG PO TABS: 1.0000 | ORAL_TABLET | Freq: Two times a day (BID) | ORAL | 0 refills | Status: DC | PRN

## 2023-08-07 NOTE — Patient Instructions (Signed)
 Marykay Snipes MD

## 2023-08-07 NOTE — Progress Notes (Addendum)
 Subjective:    Patient ID: Teresa Vincent, female    DOB: 1980-06-27, 43 y.o.   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  -she contacted her spinal physician again and was told she can only have the injection every three months -she was advised to do dry needling once per week for 6 weeks and this does not provide much benefit -tens unit helps temporarily -she has been feeling intermittent sensations for a few months; -feels like a tingling, burning, cooling sensation in lumbar area -has been getting worse she thinks because she has been standing -she thinks something is pinched or a screw was dislodged -she has been super busy at work- has not had a day of since August 15th since she picked up a 2nd job -she needs to go see her mom tomorrow -she was also doing soup kitchen with her church -this is her first weekend and she plans on relaxing -has a tens unit and it does help but if the pain is through the roof then it doesn't help -she tries to use the tens three times per day Teresa Vincent is a 43 year old woman who presents to establish care for left sided lower back pain that radiates into her left leg.  -she felt no benefit from going to the chiropractor.  -pain has been severe recently -twice a month she is working 7 days a week and she talked with her boss's boss about slowing down a little and he is seeing if this possible.  -she takes care of the garden center at [employer] -Pain has been present for years -She has had two back surgeries.  -She has had 7 injections but got no relief from these.  -She has gone back to Dr. Rochelle Chu who did her surgery.  -She was on oxycodone  5mg  that she broke in half to 2.5s which helped. This helped her to get through the day. She has been on it for years. She has been  following with Emilia Harbour.  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  -feels better when laying down, but also painful while sitting -today has been a bad day -she uses her electric blanket and heating pad -she has been working at [employer]- she is the cold at 5am in the morning and this is when the pain is worse.  -the colder it is the worse it is hurting -she got a year membership with chiropractor and has been there for a month. She has not found benefit yet.  2) left knee pain: -has had steroid injection with benefit.  -this has continued to benefit.   3) left foot pain -prescribed meloxicam  by Dr. Debrah Fan -seeing a doctor today and will told whether she needs surgical pain. She wants whatever is necessary to stop the pain -her arch stays swollen -she was supposed to do PT last month but was unable to get in until 6/6 -still present  4) Anxiety -she gets anxious about driving and she gets palpitations -she has had a prior stroke and has felt this anxiety since this time.   5) migraines -has at baseline but last week was very severe, particularly Wednesday -she considered going to the ED on Wednesday due to her pain  but pain improved without new intervention by Thursday -she saw her PCP this morning and was recommended to take Imitrex  -she does have a history of remote stroke which was thought to be related to her thyroid  disease -she has been doing ok now -her thyroid  levels had gone way out of control.  -she received a shot in the ED for her pain  -pain has been stable -she was hopeful that the more she lost weight her pain would improve  6) Loss of cervical lordosis: -working with chiropractor on improving this and has already seen improvements -joinged a gym  7) morbid obesity -she is put on a strict diet with her endocrinologist.  -lost 22 lbs in a month. She went from 262 to 244.2  today.  -husband is doing the same diet with her and he lost 34 lbs.  -they do portion control sizes and they eat meat once per week -they went vegan with a lot of things, cream cheese and cheese -they have a cheat day once per week.   8) Left foot pain Resolved with ECSWT  9) Achilles tendinosis, right -she is considering surgery -she is scheduled with me for [facility]  10) Type 2 diabetes -every now and then she will have a cheat day -she thinks her endocrinologist will be upset with her that her HgbA1c improves -she has lost 36 lbs -she has stopped soft drinks -she shows be her improvements in her lipid panel -discussed that se loves greens, hummus -her last bite is often 5:30-6;:30, sometimes 7pm if she is hungry.   11) Right foot pain: -much better after shockwave therapy -continues to be improed  12) Stress: -regarding getting custody of her grandkids as her eldest daughter has a drug addiction  13) Hyperthyroidism: -she would like referral to another endocrinologist -discussed that she has been having chest pain, tachycardia, vision abnormalities, eye twitching  14) Bilateral knee pain: -feels pinch or ache once in a while -has pressure on right knee at work  Pain Inventory Average Pain 8 Pain Right Now 6 My pain is constant, sharp, burning, dull, stabbing, and tingling  In the last 24 hours, has pain interfered with the following? General activity 10 Relation with others 4 Enjoyment of life 10 What TIME of day is your pain at its worst? morning , daytime, evening, and night Sleep (in general) Good Pain is worse with: walking, bending, sitting, inactivity, standing, and some activites Pain improves with: medication  Relief from Meds: 6     Family History  Problem Relation Age of Onset   Hypertension Mother    Thyroid  disease Neg Hx    Social History   Socioeconomic History   Marital status: Married    Spouse name: Not on file   Number of children:  Not on file   Years of education: Not on file   Highest education level: Not on file  Occupational History   Occupation: Doesn't work outside the home  Tobacco Use   Smoking status: Former    Current packs/day: 0.00    Average packs/day: 1 pack/day for 26.0 years (26.0 ttl pk-yrs)    Types: Cigarettes    Start date: 01/08/1995    Quit date: 01/07/2021    Years since quitting: 2.5   Smokeless tobacco: Former  Building services engineer status: Every Day   Substances: Nicotine , Flavoring  Substance and Sexual Activity   Alcohol use: Yes    Alcohol/week: 0.0 standard drinks of alcohol  Comment: occasional   Drug use: Not Currently    Types: Marijuana    Comment: occasional   Sexual activity: Not Currently    Birth control/protection: Surgical  Other Topics Concern   Not on file  Social History Narrative   Not on file   Social Drivers of Health   Financial Resource Strain: Not on file  Food Insecurity: Not on file  Transportation Needs: Not on file  Physical Activity: Not on file  Stress: Not on file  Social Connections: Not on file   Past Surgical History:  Procedure Laterality Date   ABDOMINAL EXPOSURE N/A 05/29/2017   Procedure: ABDOMINAL EXPOSURE;  Surgeon: Dannis Dy, MD;  Location[facility];  Service: Vascular;  Laterality: N/A;   ABDOMINAL HYSTERECTOMY     ANTERIOR LUMBAR FUSION N/A 05/29/2017   Procedure: Anterior Lumbar Interbody Fusion  - Lumbar five sacral one;  Surgeon: Isadora Mar, MD;  Location: [facility] OR;  Service: Neurosurgery;  Laterality: N/A;   BIOPSY  02/17/2020   Procedure: BIOPSY;  Surgeon: Vinetta Greening, DO;  Location: [facility];  Service: Endoscopy;;  gastric   CARPAL TUNNEL RELEASE Bilateral    CHOLECYSTECTOMY     CHOLECYSTECTOMY N/A 12/29/2016   Procedure: LAPAROSCOPIC CHOLECYSTECTOMY;  Surgeon: Awilda Bogus, MD;  Location: [facility] ORS;  Service: General;  Laterality: N/A;   COLONOSCOPY WITH PROPOFOL  N/A 02/17/2020   non-bleeding  internal hemorrhoids, otherwise normal.   CYSTOSCOPY/URETEROSCOPY/HOLMIUM LASER/STENT PLACEMENT Right 06/25/2019   Procedure: CYSTOSCOPY WITH RIGHT RETROGRADE PYELOGRAM/RIGHT URETEROSCOPY/HOLMIUM LASER APPLICATION RIGHT URETERAL CALCULUS/RIGHT URETERAL STENT PLACEMENT;  Surgeon: Marco Severs, MD;  Location: [facility];  Service: Urology;  Laterality: Right;   ESOPHAGOGASTRODUODENOSCOPY (EGD) WITH PROPOFOL  N/A 02/17/2020   Procedure: ESOPHAGOGASTRODUODENOSCOPY (EGD) WITH PROPOFOL ;  Surgeon: Vinetta Greening, DO;  Location: [facility];  Service: Endoscopy;  Laterality: N/A;   EXTRACORPOREAL SHOCK WAVE LITHOTRIPSY Right 04/10/2019   Procedure: EXTRACORPOREAL SHOCK WAVE LITHOTRIPSY (ESWL);  Surgeon: Roxane Copp, MD;  Location[facility];  Service: Urology;  Laterality: Right;   LAMINECTOMY WITH POSTERIOR LATERAL ARTHRODESIS LEVEL 1 N/A 02/28/2018   Procedure: Laminectomy and Foraminotomy Lumbar Five-Sacral One, posterolateral fusion and fixation;  Surgeon: Isadora Mar, MD;  Location: [facility] OR;  Service: Neurosurgery;  Laterality: N/A;  posterior   LUMBAR EPIDURAL INJECTION     STERIOD INJECTION N/A 01/10/2017   Procedure: MINOR EXPAREL  INJECTION;  Surgeon: Awilda Bogus, MD;  Location: [facility];  Service: General;  Laterality: N/A;   TOTAL ABDOMINAL HYSTERECTOMY W/ BILATERAL SALPINGOOPHORECTOMY N/A    TUBAL LIGATION  2003   Past Medical History:  Diagnosis Date   Abnormal Pap smear of cervix    ANXIETY 08/30/2007   Asthma    Chronic back pain    Claustrophobia    pt requests Versed  before surgery.   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   LMP 02/25/2016   Opioid Risk Score:   Fall Risk Score:  `1  Depression screen [facility] 2/9  04/03/2023    2:16 PM 12/05/2022   12:24 PM 10/02/2022    2:55 PM 04/07/2022    1:39 PM 03/21/2022    2:19 PM 03/14/2022   10:20 AM 02/28/2022    1:59 PM  Depression screen PHQ 2/9  Decreased Interest 0 0 0 0 0 0 0  Down, Depressed, Hopeless 0 0 0 0 0 0 0  PHQ - 2 Score 0 0 0 0 0 0 0  Altered sleeping    2     Tired, decreased energy    0     Change in appetite    0     Feeling bad or failure about yourself     0     Trouble concentrating    0     Moving slowly or fidgety/restless    0     Suicidal thoughts    0     PHQ-9 Score    2     Difficult doing work/chores    Somewhat difficult       Review of Systems  Constitutional:  Negative for unexpected weight change.  HENT: Negative.    Eyes: Negative.   Respiratory: Negative.    Cardiovascular:  Negative for leg swelling.  Gastrointestinal: Negative.   Endocrine: Negative.   Genitourinary: Negative.   Musculoskeletal:  Positive for arthralgias and back pain.       Pain in both legs  Skin: Negative.   Allergic/Immunologic: Negative.   Neurological:  Negative for numbness.       Tingling  Hematological: Negative.   Psychiatric/Behavioral:  The patient is nervous/anxious.   All other systems reviewed and are negative.      Objective:   Physical Exam Gen: no distress, normal appearing, weight 260 lbs, BMI 40.72, 134/84 HEENT: oral mucosa pink and moist, NCAT, exopthalmos Gen: no distress, normal appearing HEENT: oral mucosa pink and moist, NCAT Cardio: Reg rate Gen: no distress, normal appearing HEENT: oral mucosa pink and moist, NCAT Cardio: Reg rate Chest: normal effort, normal rate of breathing Abd: soft, non-distended Ext: no edema Psych: pleasant, normal affect Musculoskeletal: Normal ambulation, right achiles tendinopathy, left heel spur, negative slump test, left sided  lower extremity weakness, stable 5/20    Assessment & Plan:  Teresa Vincent is a 43 year old woman who presents to establish care for lumbar spinal stenosis/neuropathy  1) Lumbar spinal stenosis with lumbar neurogenic claudication.  -Discussed benefits of exercise in reducing pain. -UDS ordered -discussed that gabapentin  negatively impacted her memory and did not help her pain, discussed that she was taking 900mg  QID for 1 year -discussed that her spinal surgeon does not feel that she needs surgery at this time -discussed that she had a steroid injection and this provided 14 days of relief -discussed that lidocaine  patch was not helpful -Refilled Percocet -discussed disc height loss on recent XR -continue D3  -discussed that she is following with Dr. Rochelle Chu -failed lidocaine  patches -discussed avoiding topamax  given that this can cause hyperthyroidism -continue light restriction on bending and twisting -discussed that pain has acutely worsened -discussed that she got an XR and an MRI of her lumbar spine -Discussed current symptoms of pain and history of pain.  -Discussed benefits of exercise in reducing pain. -Discussed following foods that may reduce pain: 1) Ginger (especially studied for arthritis)- reduce leukotriene production to decrease inflammation 2) Blueberries- high in phytonutrients that decrease inflammation 3) Salmon- marine omega-3s reduce joint swelling and pain 4) Pumpkin seeds- reduce inflammation  5) dark chocolate- reduces inflammation 6) turmeric- reduces inflammation 7) tart cherries - reduce pain and stiffness 8) extra virgin olive oil - its compound olecanthal helps to block prostaglandins  9) chili peppers- can be eaten or applied topically via capsaicin 10) mint- helpful for headache, muscle aches, joint pain, and itching 11) garlic- reduces inflammation  Link to further information on diet for chronic pain:  [email or web address].[email or web address]   -discussed that her stress has not worsen her pain Continue heat therapy -discussed negative response to gabapentin  in the past.  Prescribing Home Zynex NexWave Stimulator Device and supplies as needed. IFC, NMES and TENS medically necessary Treatment Rx: Daily @ 30-40 minutes per treatment PRN. Zynex NexWave only, no substitutions. Treatment Goals: 1) To reduce and/or eliminate pain 2) To improve functional capacity and Activities of daily living 3) To reduce or prevent the need for oral medications 4) To improve circulation in the injured region 5) To decrease or prevent muscle spasm and muscle atrophy 6) To provide a self-management tool to the patient The patient has not sufficiently improved with conservative care. Numerous studies indexed by Medline and PubMed.gov have shown Neuromuscular, Interferential, and TENS stimulators to reduce pain, improve function, and reduce medication use in injured patients. Continued use of this evidence based, safe, drug free treatment is both reasonable and medically necessary at this time.    Continue UDS monitoring -Discussed current symptoms -She is allergic to bupivicaine, she has tried surgery, injections, medication. Oxycodone  5mg  helped. She has never failed a drug screen. She works at [employer]. Increase oxycodone  to 5mg  daily.  -Will obtain UDS and pain contract today. If results negative, can prescribe Percocet 5mg -325 monthly PRN.  Lumbar MRI reviewed as follows:  IMPRESSION: 1. No impingement identified in the lumbar spine. 2. 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 3. Degenerative disc disease at T11-12, L2- 3, L3-4, and L5-S1. 4. Cholelithiasis.   2) Obesity: weight 265 lbs, BMI 40.10 -Educated regarding health benefits of weight loss- for pain, general health, chronic disease prevention, immune health, mental health.   -continue black coffee -Educated regarding health benefits of weight loss- for pain, general health, chronic disease prevention, immune health, mental health.  -Will monitor weight every visit.  -Consider Roobois tea daily.  -Discussed the benefits of intermittent fasting. -Discussed foods that can assist in weight loss: 1) leafy greens- high in fiber and nutrients 2) dark chocolate- improves metabolism (if prefer sweetened, best to sweeten with honey instead of sugar).  3) cruciferous vegetables- high in fiber and protein 4) full fat yogurt: high in healthy fat, protein, calcium , and probiotics 5) apples- high in a variety of phytochemicals 6) nuts- high in fiber and protein that increase feelings of fullness 7) grapefruit: rich in nutrients, antioxidants, and fiber (not to be taken with anticoagulation) 8) beans- high in protein and fiber 9) salmon- has high quality protein and healthy fats 10) green tea- rich in polyphenols 11) eggs- rich in choline and vitamin D  12) tuna- high protein, boosts metabolism 13) avocado- decreases visceral abdominal fat 14) chicken (pasture raised): high in protein and iron 15) blueberries- reduce abdominal fat and cholesterol 16) whole grains- decreases calories retained during digestion, speeds metabolism 17) chia seeds- curb appetite 18) chilies- increases fat metabolism  -Discussed supplements that can be used:  1) Metatrim 400mg  BID 30 minutes before breakfast and dinner  2) Sphaeranthus indicus and Garcinia mangostana (combinations of these and #1 can be found in capsicum and zychrome  3) green coffee bean extract 400mg  twice per day or Irvingia (african mango) 150 to 300mg  twice per day.   3) migraines -agree with PCP that imitrex  is a great option. Discussed with her that there is increased risk of stroke. She states thyroid  function is currently well controlled -discussed any potential triggers- she notes new perfume last week which could  have been a trigger -advised to continue to be attentive for other food or chemical related triggers   4) HTN: -Advised checking BP daily at home and logging results to bring into follow-up appointment with PCP and myself. -Reviewed BP meds today.  -Advised regarding healthy foods that can help lower blood pressure and provided with a list: 1) citrus foods- high in vitamins and minerals 2) salmon and other fatty fish - reduces inflammation and oxylipins 3) swiss chard (leafy green)- high level of nitrates 4) pumpkin seeds- one of the best natural sources of magnesium  5) Beans and lentils- high in fiber, magnesium , and potassium 6) Berries- high in flavonoids 7) Amaranth (whole grain, can be cooked similarly to rice and oats)- high in magnesium  and fiber 8) Pistachios- even more effective at reducing BP than other nuts 9) Carrots- high in phenolic compounds that relax blood vessels and reduce inflammation 10) Celery- contain phthalides that relax tissues of arterial walls 11) Tomatoes- can also improve cholesterol and reduce risk of heart disease 12) Broccoli- good source of magnesium , calcium , and potassium 13) Greek yogurt: high in potassium and calcium  14) Herbs and spices: Celery seed, cilantro, saffron, lemongrass, black cumin, ginseng, cinnamon, cardamom, sweet basil, and ginger 15) Chia and flax seeds- also help to lower cholesterol and blood sugar 16) Beets- high levels of nitrates that relax blood vessels  17) spinach and bananas- high in potassium  -Provided lise of supplements that can help with hypertension:  1) magnesium : one high quality brand is Bioptemizers since it contains all 7 types of magnesium , otherwise over the counter magnesium  gluconate 400mg  is a good option 2) B vitamins 3) vitamin D  4) potassium 5) CoQ10 6) L-arginine 7) Vitamin C 8) Beetroot -Educated that goal BP is 120/80. -Made goal to incorporate some of the above foods into diet.    5) Left heel  plantar fascitis: Discussed resolution with ECSWT  6) Right Achiles tendinopathy: -discussed resolution with ECSWT -discussed continued improvement  7) Type 2 diabetes: -Discussed the benefits of intermittent fasting. Recommended starting with pushing dinner 15 minutes earlier and when this feels easy, continuing to push dinner 15 minutes earlier. Discussed that this can help her body to improve its ability to burn fat rather than glucose, improving insulin sensitivity. Recommended drinking Roobois tea in the evening to help curb appetite and for its numerous health benefits.   -discussed most recent HgbA1c of 6.2. -discussed that she is doing an incredible job with her intermittent fasting -commended eating smaller dinners.  -continue steamed vegetables -continue protein shake -discussed the importance of fiber.   8) Nausea: refilled zofran   9) Stress: Discussed stress from obtaining the custody of her grandkids due to her daughter having issues with drug abuse  10) Sleep apnea: continue CPAP  11) Hyperthyroidism 2/2 Grave's disease: -discussed that recent thyroid  labs have worsened and her symptoms have greatly worsened -referred to endocrinology, discussed that she thinks her endocrinologist said eating greens would help her hyperthyroidism  12) Bilateral knee pain: -discussed that she feel work worsens this pain  13) Obesity: -recommended Marykay Snipes MD -discussed pros and cons of GLP-1s -  discussed compounded versus injectibles  -discussed that cruciferous vegetables are a great natural source

## 2023-08-10 LAB — TOXASSURE SELECT,+ANTIDEPR,UR

## 2023-08-20 ENCOUNTER — Ambulatory Visit (HOSPITAL_COMMUNITY): Attending: Neurological Surgery

## 2023-08-20 DIAGNOSIS — M545 Low back pain, unspecified: Secondary | ICD-10-CM | POA: Diagnosis not present

## 2023-08-20 DIAGNOSIS — M5416 Radiculopathy, lumbar region: Secondary | ICD-10-CM | POA: Insufficient documentation

## 2023-08-20 NOTE — Therapy (Signed)
 OUTPATIENT PHYSICAL THERAPY THORACOLUMBAR TREATMENT   Patient Name: Teresa Vincent MRN: [id number] DOB:04/28/1980, 43 y.o., female Today's Date: 08/20/2023  END OF SESSION:  PT End of Session - 08/20/23 1422     Visit Number 3    Number of Visits 6    Date for PT Re-Evaluation 08/27/23    Authorization Type [employer]    PT Start Time 1420    PT Stop Time 1500    PT Time Calculation (min) 40 min    Activity Tolerance Patient tolerated treatment well    Behavior During Therapy WFL for tasks assessed/performed             Past Medical History:  Diagnosis Date   Abnormal Pap smear of cervix    ANXIETY 08/30/2007   Asthma    Chronic back pain    Claustrophobia    pt requests Versed  before surgery.   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   Past Surgical History:  Procedure Laterality Date   ABDOMINAL EXPOSURE N/A 05/29/2017   Procedure: ABDOMINAL EXPOSURE;  Surgeon: Dannis Dy, MD;  Location: [facility] OR;  Service: Vascular;  Laterality: N/A;   ABDOMINAL HYSTERECTOMY     ANTERIOR LUMBAR FUSION N/A 05/29/2017   Procedure: Anterior Lumbar Interbody Fusion  - Lumbar five sacral one;  Surgeon: Isadora Mar, MD;  Location: [facility] OR;  Service: Neurosurgery;  Laterality: N/A;   BIOPSY  02/17/2020   Procedure: BIOPSY;  Surgeon: Vinetta Greening, DO;  Location: [facility];  Service: Endoscopy;;  gastric   CARPAL TUNNEL RELEASE Bilateral    CHOLECYSTECTOMY     CHOLECYSTECTOMY N/A 12/29/2016   Procedure: LAPAROSCOPIC CHOLECYSTECTOMY;  Surgeon: Awilda Bogus, MD;  Location: [facility] ORS;  Service: General;   Laterality: N/A;   COLONOSCOPY WITH PROPOFOL  N/A 02/17/2020   non-bleeding internal hemorrhoids, otherwise normal.   CYSTOSCOPY/URETEROSCOPY/HOLMIUM LASER/STENT PLACEMENT Right 06/25/2019   Procedure: CYSTOSCOPY WITH RIGHT RETROGRADE PYELOGRAM/RIGHT URETEROSCOPY/HOLMIUM LASER APPLICATION RIGHT URETERAL CALCULUS/RIGHT URETERAL STENT PLACEMENT;  Surgeon: Marco Severs, MD;  Location: [facility];  Service: Urology;  Laterality: Right;   ESOPHAGOGASTRODUODENOSCOPY (EGD) WITH PROPOFOL  N/A 02/17/2020   Procedure: ESOPHAGOGASTRODUODENOSCOPY (EGD) WITH PROPOFOL ;  Surgeon: Vinetta Greening, DO;  Location: [facility];  Service: Endoscopy;  Laterality: N/A;   EXTRACORPOREAL SHOCK WAVE LITHOTRIPSY Right 04/10/2019   Procedure: EXTRACORPOREAL SHOCK WAVE LITHOTRIPSY (ESWL);  Surgeon: Roxane Copp, MD;  Location: [facility];  Service: Urology;  Laterality: Right;   LAMINECTOMY WITH POSTERIOR LATERAL ARTHRODESIS LEVEL 1 N/A 02/28/2018   Procedure: Laminectomy and Foraminotomy Lumbar Five-Sacral One, posterolateral fusion and fixation;  Surgeon: Isadora Mar, MD;  Location: [facility] OR;  Service: Neurosurgery;  Laterality: N/A;  posterior   LUMBAR EPIDURAL INJECTION     STERIOD INJECTION N/A 01/10/2017   Procedure: MINOR EXPAREL  INJECTION;  Surgeon: Awilda Bogus, MD;  Location: [facility];  Service: General;  Laterality: N/A;   TOTAL ABDOMINAL HYSTERECTOMY W/ BILATERAL SALPINGOOPHORECTOMY N/A    TUBAL LIGATION  2003  Patient Active Problem List   Diagnosis Date Noted   Rectal bleeding 06/27/2023   Chronic left-sided low back pain 04/04/2023   Morbid obesity (HCC) 03/09/2023   Non-adherence to medical treatment 03/09/2023   SVT (supraventricular tachycardia) (HCC) 03/21/2022   OSA (obstructive sleep apnea) 11/11/2021   TIA (transient ischemic attack) 11/01/2021   Urinary incontinence 08/29/2021   Chronic pain syndrome 02/28/2021   S/P lumbar fusion 02/28/2018   Prediabetes  12/04/2017   Mixed hyperlipidemia 10/11/2016   Essential hypertension, benign 12/30/2012   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   Anxiety state 08/30/2007   Migraine headache 08/30/2007   Insomnia 08/30/2007    PCP: Kathleen Papa  REFERRING PROVIDER: Joaquin Mulberry, MD  REFERRING DIAG: M54.16 (ICD-10-CM) - Radiculopathy, lumbar region  Rationale for Evaluation and Treatment: Rehabilitation  THERAPY DIAG:  Radiculopathy, lumbar region  Low back pain, unspecified back pain laterality, unspecified chronicity, unspecified whether sciatica present  ONSET DATE: chronic  SUBJECTIVE:                                                                                                                                                                                           SUBJECTIVE STATEMENT: Hurting more overall; 9.5/10 pain today.  Missed last appointment due to having to help her mom.  Just left work.  Hurting her legs and feet "so bad"; she feels it is in her bones.  Just feels like "it's not going to get better".  Laying on her stomach does not make her legs hurt.    EVAL: Pain down both legs; right down to ankle; left leg down to knees; back pain has increased. Has had multiple injections  Returned to [employer].  Had another epidural injection at L3 and "felt amazing" for about 3 weeks and then stopped working.  Referred to PT for dry needling.  Sees a pain management MD PERTINENT HISTORY:  2019 2 lumbar surgeries; one in March and one in December per [facility]  PAIN:  Are you having pain? Yes: NPRS scale: 7.5/10; lowest 3/10; worst 10/10  Pain location: low back and down legs Pain description: aching, burning, stabbing Aggravating factors: anything Relieving factors: pain medication, TENS  PRECAUTIONS: None  RED FLAGS: None   WEIGHT BEARING RESTRICTIONS: No  FALLS:  Has patient fallen in last 6 months? 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  DIAGNOSTIC FINDINGS:  CLINICAL DATA:  Two prior lumbar spine surgeries. Chronic low  back pain.   EXAM: MRI LUMBAR SPINE WITHOUT CONTRAST   TECHNIQUE: Multiplanar, multisequence MR imaging of the lumbar spine was performed. No intravenous contrast was administered.   COMPARISON:  Lumbar myelogram 09/09/2018. MRI of lumbar spine 12/16/2015   FINDINGS: Segmentation: 5 non rib-bearing lumbar type vertebral bodies are present. The lowest fully formed vertebral body is L5.   Alignment: Mild grade 1 retrolisthesis at L3-4 has increased slightly, measuring up to 3 mm. Grade 1 anterolisthesis at L5-S1 is fused in stable. No other significant listhesis is present. Lumbar lordosis is stable. Mild leftward curvature is again noted at L3-4.   Vertebrae: Chronic fatty infiltration at L5 and S1 has progressed. Marrow signal and vertebral body heights are otherwise normal.   Conus medullaris and cauda equina: Conus extends to the L1-2 level. Conus and cauda equina appear normal.   Paraspinal and other soft tissues: Limited imaging the abdomen is unremarkable. There is no significant adenopathy. No solid organ lesions are present.   Disc levels:   T12-L1: Normal disc signal and height is present. No focal protrusion or stenosis is present.   L1-2: Normal disc signal and height is present. No focal protrusion or stenosis is present.   L2-3: Normal disc signal and height is present. No focal protrusion or stenosis is present.   L3-4: A progressive broad-based disc protrusion is present. Moderate left and mild right foraminal stenosis has progressed.   L5-S1: Wide laminectomy is noted. The central canal and foramina are decompressed.   IMPRESSION: 1. Progressive broad-based disc protrusion at L3-4 with  moderate left and mild right foraminal stenosis. 2. Wide laminectomy at L5-S1 with stable grade 1 anterolisthesis. The central canal and foramina are decompressed. 3. No other significant disc disease or stenosis at the other levels.  PATIENT SURVEYS:  Modified Oswestry 21/50 42%   COGNITION: Overall cognitive status: Within functional limits for tasks assessed     SENSATION: WFL   POSTURE: increased lumbar lordosis and flexed trunk   PALPATION: Tender bilateral lumbar spine and left side paraspinals  LUMBAR ROM:   AROM eval  Flexion To 3" past distal knee cap  Extension "Feels better" 50% available  Right lateral flexion Fingertips 3" above knee joint line  Left lateral flexion Fingertips 4" below greater troch  Right rotation 50% available  Left rotation 15% available   (Blank rows = not tested)  LOWER EXTREMITY ROM:     Active  Right eval Left eval  Hip flexion    Hip extension    Hip abduction    Hip adduction    Hip internal rotation    Hip external rotation    Knee flexion    Knee extension    Ankle dorsiflexion    Ankle plantarflexion    Ankle inversion    Ankle eversion     (Blank rows = not tested)  LOWER EXTREMITY MMT:    MMT Right eval Left eval  Hip flexion 4+ 3-  Hip extension 4+ 3-  Hip abduction    Hip adduction    Hip internal rotation    Hip external rotation    Knee flexion 5 4+  Knee extension 5 5  Ankle dorsiflexion 5 4  Ankle plantarflexion    Ankle inversion    Ankle eversion     (Blank rows = not tested)    FUNCTIONAL TESTS:  5 times sit to stand: 12.88 sec no UE assist  GAIT: Distance walked: 50 ft in clinic Assistive device utilized: None  Level of assistance: Modified independence Comments: slight antalgic gait and decreased gait speed  TREATMENT DATE:  08/20/23 Trigger Point Dry Needling  Subsequent Treatment: Instructions provided previously at initial dry needling treatment.   Patient Verbal Consent  Given: Yes Education Handout Provided: Previously Provided Muscles Treated: lumbar multifidus and glutes Electrical Stimulation Performed: Yes, Parameters: micro; 100 frequency intensity as tolerated Treatment Response/Outcome: decreased pain  Discussion with patient regarding changes with dry needling; now an out of pocket cost as insurance does not cover Discussion regarding the benefits of aquatic therapy      07/26/23 Review of HEP and goals Review of dry needling patient instructions Trigger Point Dry Needling  Initial Treatment: Pt instructed on Dry Needling rational, procedures, and possible side effects. Pt instructed to expect mild to moderate muscle soreness later in the day and/or into the next day.  Pt instructed in methods to reduce muscle soreness. Pt instructed to continue prescribed HEP. Because Dry Needling was performed over or adjacent to a lung field, pt was educated on S/S of pneumothorax and to seek immediate medical attention should they occur.  Patient was educated on signs and symptoms of infection and other risk factors and advised to seek medical attention should they occur.  Patient verbalized understanding of these instructions and education.   Patient Verbal Consent Given: Yes Education Handout Provided: Previously Provided Muscles Treated: left multifidus lumborum Electrical Stimulation Performed: Yes, Parameters: micro frequency 100, intensity as tolerated x 10 min Treatment Response/Outcome: decreased pain during treatment     07/16/23 physical therapy evaluation and HEP instruction                                                                                                                                 PATIENT EDUCATION:  Education details: Patient educated on exam findings, POC, scope of PT, HEP, and what to expect next visit; dry needling instructions. Person educated: Patient Education method: Explanation, Demonstration, and  Handouts Education comprehension: verbalized understanding, returned demonstration, verbal cues required, and tactile cues required  HOME EXERCISE PROGRAM: Access Code: [email or web address] URL: [email or web address] Date: 07/16/2023 Prepared by: [facility] - Rehab  Exercises - Lying Prone  - 3 x daily - 7 x weekly - 1 sets - 1 reps - Prone Off Center Lumbar Spine  - 3 x daily - 7 x weekly - 1 sets - 1 reps  ASSESSMENT:  CLINICAL IMPRESSION: Patient session focused on decreasing pain.  Dry needling with e-stim applied.  Patient continues with high pain levels even after e-stim.  She gets some relief with using arms to offload spine but otherwise has increased pain.   Patient will benefit from continued skilled therapy services to address deficits and promote return to optimal function.       Eval: Patient is a 43 y.o. female who was seen today for physical therapy evaluation and treatment for M54.16 (ICD-10-CM) - Radiculopathy, lumbar region. Patient demonstrates muscle weakness, reduced ROM,  and fascial restrictions which are likely contributing to symptoms of pain and are negatively impacting patient ability to perform ADLs and functional mobility tasks. Patient will benefit from skilled physical therapy services to address these deficits to reduce pain and improve level of function with ADLs and functional mobility tasks.   OBJECTIVE IMPAIRMENTS: Abnormal gait, decreased activity tolerance, decreased endurance, decreased mobility, decreased ROM, decreased strength, impaired perceived functional ability, and pain.   ACTIVITY LIMITATIONS: carrying, lifting, bending, sitting, standing, squatting, transfers, bed mobility, and locomotion level  PARTICIPATION LIMITATIONS: meal prep, cleaning, laundry, driving, shopping, community activity, occupation, and yard work  [facility] POTENTIAL: Good  CLINICAL DECISION MAKING: Evolving/moderate complexity  EVALUATION COMPLEXITY:  Moderate   GOALS: Goals reviewed with patient? No  SHORT TERM GOALS: Target date: 08/06/2023  patient will be independent with initial HEP  Baseline: Goal status: in progress  2.  Patient will report 50% improvement overall  Baseline:  Goal status: in progress   LONG TERM GOALS: Target date: 08/27/2023  Patient will be independent in self management strategies to improve quality of life and functional outcomes.   Baseline:  Goal status: in progress  2.  Patient will report 75% improvement overall  Baseline:  Goal status: in progress  3.  Patient will improve Modified Oswestry score by 6 points to demonstrate improved perceived function  Baseline: 21/50 42% Goal status: in progress  4.   Patient will increase bilateral leg MMT's to 4+ to 5/5 to allow navigation of steps without gait deviation or loss of balance  Baseline: see above Goal status: in progress   PLAN:  PT FREQUENCY: 1x/week  PT DURATION: 6 weeks  PLANNED INTERVENTIONS: 97164- PT Re-evaluation, 97110-Therapeutic exercises, 97530- Therapeutic activity, 97112- Neuromuscular re-education, 97535- Self Care, 13086- Manual therapy, [phone]- Gait training, [phone]- Orthotic Fit/training, [phone]- Canalith repositioning, [id number]- Aquatic Therapy, [phone]- Splinting, Patient/Family education, Balance training, Stair training, Taping, Dry Needling, Joint mobilization, Joint manipulation, Spinal manipulation, Spinal mobilization, Scar mobilization, and DME instructions. Aaron Aas  PLAN FOR NEXT SESSION:  try DN with e-stim; lumbar mobility and strength  3:04 PM, 08/20/23 Preslea Rhodus Small Garnet [place] MPT [facility] [place] [phone]

## 2023-08-27 ENCOUNTER — Encounter (HOSPITAL_COMMUNITY)

## 2023-08-30 DIAGNOSIS — E890 Postprocedural hypothyroidism: Secondary | ICD-10-CM | POA: Diagnosis not present

## 2023-09-04 ENCOUNTER — Encounter (HOSPITAL_COMMUNITY)

## 2023-09-05 ENCOUNTER — Other Ambulatory Visit: Payer: Self-pay | Admitting: Family Medicine

## 2023-09-18 ENCOUNTER — Encounter: Payer: Self-pay | Admitting: Physical Medicine and Rehabilitation

## 2023-09-18 ENCOUNTER — Other Ambulatory Visit: Payer: Self-pay | Admitting: Physical Medicine and Rehabilitation

## 2023-09-18 MED ORDER — OXYCODONE-ACETAMINOPHEN 5-325 MG PO TABS: 1.0000 | ORAL_TABLET | Freq: Two times a day (BID) | ORAL | 0 refills | Status: DC | PRN

## 2023-09-21 ENCOUNTER — Other Ambulatory Visit: Payer: Self-pay

## 2023-09-21 ENCOUNTER — Encounter (HOSPITAL_COMMUNITY): Payer: Self-pay | Admitting: Emergency Medicine

## 2023-09-21 ENCOUNTER — Emergency Department (HOSPITAL_COMMUNITY)

## 2023-09-21 ENCOUNTER — Encounter: Payer: Self-pay | Admitting: Physical Medicine and Rehabilitation

## 2023-09-21 ENCOUNTER — Emergency Department (HOSPITAL_COMMUNITY)
Admission: EM | Admit: 2023-09-21 | Discharge: 2023-09-21 | Disposition: A | Discharge status: Home / Self Care | Attending: Emergency Medicine | Admitting: Emergency Medicine

## 2023-09-21 DIAGNOSIS — N200 Calculus of kidney: Secondary | ICD-10-CM

## 2023-09-21 DIAGNOSIS — Z79899 Other long term (current) drug therapy: Secondary | ICD-10-CM | POA: Insufficient documentation

## 2023-09-21 DIAGNOSIS — N132 Hydronephrosis with renal and ureteral calculous obstruction: Secondary | ICD-10-CM | POA: Insufficient documentation

## 2023-09-21 DIAGNOSIS — I10 Essential (primary) hypertension: Secondary | ICD-10-CM | POA: Diagnosis not present

## 2023-09-21 DIAGNOSIS — N201 Calculus of ureter: Secondary | ICD-10-CM

## 2023-09-21 DIAGNOSIS — R109 Unspecified abdominal pain: Secondary | ICD-10-CM | POA: Diagnosis not present

## 2023-09-21 LAB — COMPREHENSIVE METABOLIC PANEL WITH GFR
ALT: 20 U/L (ref 0–44)
AST: 20 U/L (ref 15–41)
Albumin: 4 g/dL (ref 3.5–5.0)
Alkaline Phosphatase: 64 U/L (ref 38–126)
Anion gap: 12 (ref 5–15)
BUN: 17 mg/dL (ref 6–20)
CO2: 23 mmol/L (ref 22–32)
Calcium: 9.3 mg/dL (ref 8.9–10.3)
Chloride: 102 mmol/L (ref 98–111)
Creatinine, Ser: 0.9 mg/dL (ref 0.44–1.00)
GFR, Estimated: >60 mL/min (ref >60)
Glucose, Bld: 141 mg/dL — ABNORMAL HIGH (ref 70–99)
Potassium: 4 mmol/L (ref 3.5–5.1)
Sodium: 137 mmol/L (ref 135–145)
Total Bilirubin: 0.6 mg/dL (ref 0.0–1.2)
Total Protein: 7.4 g/dL (ref 6.5–8.1)

## 2023-09-21 LAB — CBC WITH DIFFERENTIAL/PLATELET
Abs Immature Granulocytes: 0.02 K/uL (ref 0.00–0.07)
Basophils Absolute: 0 K/uL (ref 0.0–0.1)
Basophils Relative: 1 %
Eosinophils Absolute: 0.2 K/uL (ref 0.0–0.5)
Eosinophils Relative: 2 %
HCT: 43.1 % (ref 36.0–46.0)
Hemoglobin: 14.6 g/dL (ref 12.0–15.0)
Immature Granulocytes: 0 %
Lymphocytes Relative: 32 %
Lymphs Abs: 2.5 K/uL (ref 0.7–4.0)
MCH: 30.3 pg (ref 26.0–34.0)
MCHC: 33.9 g/dL (ref 30.0–36.0)
MCV: 89.4 fL (ref 80.0–100.0)
Monocytes Absolute: 0.6 K/uL (ref 0.1–1.0)
Monocytes Relative: 8 %
Neutro Abs: 4.5 K/uL (ref 1.7–7.7)
Neutrophils Relative %: 57 %
Platelets: 295 K/uL (ref 150–400)
RBC: 4.82 MIL/uL (ref 3.87–5.11)
RDW: 12.6 % (ref 11.5–15.5)
WBC: 7.8 K/uL (ref 4.0–10.5)
nRBC: 0 % (ref 0.0–0.2)

## 2023-09-21 LAB — URINALYSIS, ROUTINE W REFLEX MICROSCOPIC
Bilirubin Urine: NEGATIVE
Glucose, UA: NEGATIVE mg/dL
Ketones, ur: NEGATIVE mg/dL
Leukocytes,Ua: NEGATIVE
Nitrite: NEGATIVE
Protein, ur: 100 mg/dL — AB
RBC / HPF: >50 RBC/hpf (ref 0–5)
Specific Gravity, Urine: 1.025 (ref 1.005–1.030)
pH: 5 (ref 5.0–8.0)

## 2023-09-21 LAB — PREGNANCY, URINE: Preg Test, Ur: NEGATIVE

## 2023-09-21 MED ORDER — ONDANSETRON HCL 4 MG/2ML IJ SOLN
4.0000 mg | Freq: Once | INTRAMUSCULAR | Status: AC
  Administered 2023-09-21: 4 mg via INTRAVENOUS
  Filled 2023-09-21: qty 2

## 2023-09-21 MED ORDER — KETOROLAC TROMETHAMINE 15 MG/ML IJ SOLN
15.0000 mg | Freq: Once | INTRAMUSCULAR | Status: AC
  Administered 2023-09-21: 15 mg via INTRAVENOUS
  Filled 2023-09-21: qty 1

## 2023-09-21 MED ORDER — FENTANYL CITRATE (PF) 100 MCG/2ML IJ SOLN
100.0000 ug | Freq: Once | INTRAMUSCULAR | Status: AC
  Administered 2023-09-21: 100 ug via INTRAVENOUS
  Filled 2023-09-21: qty 2

## 2023-09-21 MED ORDER — IBUPROFEN 600 MG PO TABS: 600.0000 mg | ORAL_TABLET | Freq: Three times a day (TID) | ORAL | 0 refills | Status: DC | PRN

## 2023-09-21 MED ORDER — MORPHINE SULFATE (PF) 4 MG/ML IV SOLN: 6.0000 mg | Freq: Once | INTRAVENOUS | Status: DC

## 2023-09-21 NOTE — ED Provider Notes (Signed)
[place] EMERGENCY DEPARTMENT AT [facility] Provider Note   CSN: [id number] Arrival date & time: 09/21/23  9297     Patient presents with: Flank Pain   Teresa Vincent is a 43 y.o. female.   HPI     43 year old patient comes in with chief complaint of abdominal pain, nausea, vomiting.  Patient has history of cholecystectomy, hysterectomy, hypertension and kidney stone requiring lithotripsy.  Patient states that she has been having abdominal pain for the last 3 days.  Pain is in the flank region/lower back region and radiates towards the front.  The pain is waxing and waning in intensity.  This morning the pain woke her up from sleep.  She denies any burning with urination, blood in the urine, fevers, chills.  She had a kidney stone several years ago, that required lithotripsy.  Her initial thought is that this pain is because of kidney stone.  Patient has taken home pain medicine without significant relief.  Prior to Admission medications   Medication Sig Start Date End Date Taking? Authorizing Provider  ibuprofen  (ADVIL ) 600 MG tablet Take 1 tablet (600 mg total) by mouth every 8 (eight) hours as needed. 09/21/23  Yes Charlyn Sora, MD  albuterol  (VENTOLIN  HFA) 108 (90 Base) MCG/ACT inhaler Inhale 1-2 puffs into the lungs every 4 (four) hours as needed for wheezing or shortness of breath. 08/29/21   Cook, Jayce G, DO  ALPRAZolam  (XANAX ) 0.5 MG tablet TAKE 1/2 TO 1 (ONE-HALF TO ONE) TABLET BY MOUTH TWICE DAILY AS NEEDED FOR ANXIETY 02/13/23   Cook, Jayce G, DO  Cholecalciferol 50 MCG (2000 UT) CAPS Take 1 capsule by mouth daily.    [provider]  cyclobenzaprine  (FLEXERIL ) 5 MG tablet Take 5 mg by mouth 3 (three) times daily as needed.    [provider]  levothyroxine  (SYNTHROID ) 200 MCG tablet Take 1 tablet (200 mcg total) by mouth daily. 06/13/23   Nida, Gebreselassie W, MD  lisinopril  (ZESTRIL ) 20 MG tablet Take 1 tablet (20 mg total) by mouth  daily. 06/05/23   Cook, Jayce G, DO  metoprolol  succinate (TOPROL -XL) 50 MG 24 hr tablet TAKE 1 TABLET DAILY 09/05/23   Cook, Jayce G, DO  omeprazole  (PRILOSEC) 40 MG capsule Take 1 capsule (40 mg total) by mouth 2 (two) times daily before a meal. 07/02/23   Shirlean Therisa ORN, NP  ondansetron  (ZOFRAN -ODT) 4 MG disintegrating tablet Take 1 tablet (4 mg total) by mouth every 8 (eight) hours as needed for nausea or vomiting. 10/03/22   Cook, Jayce G, DO  oxyCODONE -acetaminophen  (PERCOCET) 5-325 MG tablet Take 1 tablet by mouth 2 (two) times daily as needed for moderate pain (pain score 4-6). 09/18/23 09/17/24  Lorilee Sven SQUIBB, MD  rosuvastatin  (CRESTOR ) 20 MG tablet Take 1 tablet (20 mg total) by mouth at bedtime. 06/13/23   Nida, Gebreselassie W, MD  SUMAtriptan  (IMITREX ) 100 MG tablet TAKE 1 TABLET BY MOUTH AT THE FIRST SIGN OF MIGRAINE ADDITIONAL DOSE IN 2 HOURS IF HEADACHE PERSISTS OR RECURS 07/30/23   Mauro Elveria BROCKS, NP    Allergies: Bupivacaine , Contrast media [iodinated contrast media], Indomethacin , Adhesive [tape], and Celexa [citalopram hydrobromide]    Review of Systems  All other systems reviewed and are negative.   Updated Vital Signs BP (!) 179/106 (BP Location: Left Arm)   Pulse 63   Temp 98.4 F (36.9 C) (Oral)   Resp 17   Ht 5' 7 (1.702 m)   Wt 116.6 kg  LMP 02/25/2016   SpO2 96%   BMI 40.25 kg/m   Physical Exam Vitals and nursing note reviewed.  Constitutional:      General: She is in acute distress.     Appearance: She is well-developed.     Comments: Acute distress due to pain and nausea  HENT:     Head: Atraumatic.  Eyes:     Extraocular Movements: Extraocular movements intact.     Pupils: Pupils are equal, round, and reactive to light.  Cardiovascular:     Rate and Rhythm: Normal rate.  Pulmonary:     Effort: Pulmonary effort is normal.  Abdominal:     Palpations: Abdomen is soft.     Comments: No right lower quadrant tenderness  Musculoskeletal:      Cervical back: Normal range of motion and neck supple.  Skin:    General: Skin is warm and dry.  Neurological:     Mental Status: She is alert and oriented to person, place, and time.     (all labs ordered are listed, but only abnormal results are displayed) Labs Reviewed  URINALYSIS, ROUTINE W REFLEX MICROSCOPIC - Abnormal; Notable for the following components:      Result Value   APPearance HAZY (*)    Hgb urine dipstick MODERATE (*)    Protein, ur 100 (*)    Bacteria, UA RARE (*)    All other components within normal limits  COMPREHENSIVE METABOLIC PANEL WITH GFR - Abnormal; Notable for the following components:   Glucose, Bld 141 (*)    All other components within normal limits  CBC WITH DIFFERENTIAL/PLATELET  PREGNANCY, URINE    EKG: None  Radiology: CT Renal Stone Study Result Date: 09/21/2023 CLINICAL DATA:  Abdominal flank pain.  History of kidney stones. EXAM: CT ABDOMEN AND PELVIS WITHOUT CONTRAST TECHNIQUE: Multidetector CT imaging of the abdomen and pelvis was performed following the standard protocol without IV contrast. RADIATION DOSE REDUCTION: This exam was performed according to the departmental dose-optimization program which includes automated exposure control, adjustment of the mA and/or kV according to patient size and/or use of iterative reconstruction technique. COMPARISON:  06/04/2019 FINDINGS: Lower chest: No acute findings. Hepatobiliary: The liver shows diffusely decreased attenuation suggesting fat deposition. Tiny hypodensity in segment IV is unchanged consistent with benign etiology. No followup imaging is recommended. Gallbladder is surgically absent. No intrahepatic or extrahepatic biliary dilation. Pancreas: No focal mass lesion. No dilatation of the main duct. No intraparenchymal cyst. No peripancreatic edema. Spleen: No splenomegaly. No suspicious focal mass lesion. Adrenals/Urinary Tract: No adrenal nodule or mass. Left kidney and ureter unremarkable.  Stable 2.7 cm exophytic water  density lesion lower pole right kidney consistent with a cyst. No followup imaging is recommended. 1 mm nonobstructing stone identified interpolar right kidney with mild right hydronephrosis secondary to the presence of a 6 x 6 x 5 mm stone at the right UPJ (42/2). Right ureter unremarkable. Bladder is decompressed. Stomach/Bowel: Stomach is unremarkable. No gastric wall thickening. No evidence of outlet obstruction. Duodenum is normally positioned as is the ligament of Treitz. No small bowel wall thickening. No small bowel dilatation. The terminal ileum is normal. The appendix is normal. No gross colonic mass. No colonic wall thickening. Vascular/Lymphatic: There is mild atherosclerotic calcification of the abdominal aorta without aneurysm. There is no gastrohepatic or hepatoduodenal ligament lymphadenopathy. No retroperitoneal or mesenteric lymphadenopathy. No pelvic sidewall lymphadenopathy. Reproductive: There is no adnexal mass. Other: No intraperitoneal free fluid. Musculoskeletal: Status post lumbosacral fusion. No worrisome lytic  or sclerotic osseous abnormality. IMPRESSION: 1. 6 x 6 x 5 mm stone at the right UPJ with mild right hydronephrosis. 2. 1 mm nonobstructing stone interpolar right kidney. 3. Hepatic steatosis. 4.  Aortic Atherosclerosis (ICD10-I70.0). Electronically Signed   By: Camellia Candle M.D.   On: 09/21/2023 08:11     Procedures   Medications Ordered in the ED  morphine  (PF) 4 MG/ML injection 6 mg (has no administration in time range)  fentaNYL  (SUBLIMAZE ) injection 100 mcg (100 mcg Intravenous Given 09/21/23 0736)  ondansetron  (ZOFRAN ) injection 4 mg (4 mg Intravenous Given 09/21/23 0737)  ketorolac  (TORADOL ) 15 MG/ML injection 15 mg (15 mg Intravenous Given 09/21/23 0840)    Clinical Course as of 09/21/23 0921  Fri Sep 21, 2023  0917 Patient's CT scan confirms 6 mm stone with hydronephrosis.  Patient reassessed after fentanyl .  She indicated that her  pain was returning.  Toradol  given thereafter.  I reassessed the patient at 9:15 AM, and she states that she feels a lot better.  She still wants something for pain before she goes. Patient already has pain medications at home due to her chronic pain issues.  She indicates she also has Zofran  at home.  Plan is to discharge with urology follow-up and strict return precautions. [AN]    Clinical Course User Index [AN] Charlyn Sora, MD                                 Medical Decision Making Amount and/or Complexity of Data Reviewed Labs: ordered. Radiology: ordered.  Risk Prescription drug management.   This patient presents to the ED with chief complaint(s) of flank pain with pertinent past medical history of kidney stone, hysterectomy, appendectomy.The complaint involves an extensive differential diagnosis and also carries with it a high risk of complications and morbidity.    The differential diagnosis includes : Acute appendicitis, acute gallstones, nephrolithiasis, pyelonephritis, torsion  The initial plan is to get basic labs.  Patient is status post hysterectomy, therefore less likely to be anything pregnancy related or uterine related.   Additional history obtained: Records reviewed previous urologic records.  On 05-2019, patient had CT renal stone.  She had right-sided kidney stone at that time.  Independent labs interpretation:  The following labs were independently interpreted: Patient has hematuria.  CBC and metabolic profile is normal  Independent visualization and interpretation of imaging: - I independently visualized the following imaging with scope of interpretation limited to determining acute life threatening conditions related to emergency care: CT renal stone, which revealed what appears to be right-sided hydronephrosis, right-sided kidney stone and right ureter stone.  Radiologist impression pending.  Treatment and Reassessment: The patient appears reasonably  screened and/or stabilized for discharge and I doubt any other medical condition or other [facility] requiring further screening, evaluation, or treatment in the ED at this time prior to discharge.   Results from the ER workup discussed with the patient face to face and all questions answered to the best of my ability. The patient is safe for discharge with strict return precautions.   Final diagnoses:  Ureterolithiasis  Nephrolithiasis    ED Discharge Orders          Ordered    ibuprofen  (ADVIL ) 600 MG tablet  Every 8 hours PRN        09/21/23 0919               Charlyn Sora, MD 09/21/23  0921  

## 2023-09-21 NOTE — Discharge Instructions (Signed)
 We saw you in the ER for the abdominal pain. Our results indicate that you have a kidney stone. We were able to get your pain is relative control, and we can safely send you home.  Take the meds prescribed. Set up an appointment with the Urologist. If the pain is unbearable, you start having fevers, chills, and are unable to keep any meds down - then return to the ER.

## 2023-09-21 NOTE — ED Triage Notes (Signed)
 Pt c/o right sided flank x 2 days with 1 episode of emesis this am, hx of kidney stones

## 2023-09-22 ENCOUNTER — Encounter (HOSPITAL_COMMUNITY): Payer: Self-pay | Admitting: Emergency Medicine

## 2023-09-22 ENCOUNTER — Emergency Department (HOSPITAL_COMMUNITY)
Admission: EM | Admit: 2023-09-22 | Discharge: 2023-09-22 | Disposition: A | Discharge status: Home / Self Care | Attending: Emergency Medicine | Admitting: Emergency Medicine

## 2023-09-22 ENCOUNTER — Other Ambulatory Visit: Payer: Self-pay

## 2023-09-22 DIAGNOSIS — N200 Calculus of kidney: Secondary | ICD-10-CM | POA: Diagnosis not present

## 2023-09-22 DIAGNOSIS — N132 Hydronephrosis with renal and ureteral calculous obstruction: Secondary | ICD-10-CM | POA: Diagnosis not present

## 2023-09-22 DIAGNOSIS — R1031 Right lower quadrant pain: Secondary | ICD-10-CM | POA: Diagnosis not present

## 2023-09-22 DIAGNOSIS — N201 Calculus of ureter: Secondary | ICD-10-CM

## 2023-09-22 LAB — URINALYSIS, ROUTINE W REFLEX MICROSCOPIC
Bilirubin Urine: NEGATIVE
Glucose, UA: NEGATIVE mg/dL
Ketones, ur: NEGATIVE mg/dL
Leukocytes,Ua: NEGATIVE
Nitrite: NEGATIVE
Protein, ur: 100 mg/dL — AB
RBC / HPF: >50 RBC/hpf (ref 0–5)
Specific Gravity, Urine: 1.023 (ref 1.005–1.030)
pH: 5 (ref 5.0–8.0)

## 2023-09-22 LAB — CBC WITH DIFFERENTIAL/PLATELET
Abs Immature Granulocytes: 0.02 K/uL (ref 0.00–0.07)
Basophils Absolute: 0 K/uL (ref 0.0–0.1)
Basophils Relative: 1 %
Eosinophils Absolute: 0.1 K/uL (ref 0.0–0.5)
Eosinophils Relative: 2 %
HCT: 39.1 % (ref 36.0–46.0)
Hemoglobin: 13.5 g/dL (ref 12.0–15.0)
Immature Granulocytes: 0 %
Lymphocytes Relative: 34 %
Lymphs Abs: 2.4 K/uL (ref 0.7–4.0)
MCH: 30.6 pg (ref 26.0–34.0)
MCHC: 34.5 g/dL (ref 30.0–36.0)
MCV: 88.7 fL (ref 80.0–100.0)
Monocytes Absolute: 0.4 K/uL (ref 0.1–1.0)
Monocytes Relative: 6 %
Neutro Abs: 4 K/uL (ref 1.7–7.7)
Neutrophils Relative %: 57 %
Platelets: 270 K/uL (ref 150–400)
RBC: 4.41 MIL/uL (ref 3.87–5.11)
RDW: 12.6 % (ref 11.5–15.5)
WBC: 6.9 K/uL (ref 4.0–10.5)
nRBC: 0 % (ref 0.0–0.2)

## 2023-09-22 LAB — COMPREHENSIVE METABOLIC PANEL WITH GFR
ALT: 19 U/L (ref 0–44)
AST: 21 U/L (ref 15–41)
Albumin: 3.7 g/dL (ref 3.5–5.0)
Alkaline Phosphatase: 58 U/L (ref 38–126)
Anion gap: 11 (ref 5–15)
BUN: 18 mg/dL (ref 6–20)
CO2: 22 mmol/L (ref 22–32)
Calcium: 9.2 mg/dL (ref 8.9–10.3)
Chloride: 104 mmol/L (ref 98–111)
Creatinine, Ser: 0.84 mg/dL (ref 0.44–1.00)
GFR, Estimated: >60 mL/min (ref >60)
Glucose, Bld: 181 mg/dL — ABNORMAL HIGH (ref 70–99)
Potassium: 4.1 mmol/L (ref 3.5–5.1)
Sodium: 137 mmol/L (ref 135–145)
Total Bilirubin: 0.8 mg/dL (ref 0.0–1.2)
Total Protein: 6.8 g/dL (ref 6.5–8.1)

## 2023-09-22 MED ORDER — KETOROLAC TROMETHAMINE 15 MG/ML IJ SOLN
15.0000 mg | Freq: Once | INTRAMUSCULAR | Status: AC
  Administered 2023-09-22: 15 mg via INTRAVENOUS
  Filled 2023-09-22: qty 1

## 2023-09-22 MED ORDER — TAMSULOSIN HCL 0.4 MG PO CAPS
0.4000 mg | ORAL_CAPSULE | Freq: Once | ORAL | Status: DC
  Filled 2023-09-22: qty 1

## 2023-09-22 MED ORDER — HYDROMORPHONE HCL 1 MG/ML IJ SOLN
1.0000 mg | Freq: Once | INTRAMUSCULAR | Status: AC
  Administered 2023-09-22: 1 mg via INTRAVENOUS
  Filled 2023-09-22: qty 1

## 2023-09-22 MED ORDER — SODIUM CHLORIDE 0.9 % IV BOLUS
1000.0000 mL | Freq: Once | INTRAVENOUS | Status: AC
  Administered 2023-09-22: 1000 mL via INTRAVENOUS

## 2023-09-22 MED ORDER — TAMSULOSIN HCL 0.4 MG PO CAPS: 0.4000 mg | ORAL_CAPSULE | Freq: Every day | ORAL | 0 refills | Status: DC

## 2023-09-22 MED ORDER — ONDANSETRON HCL 4 MG/2ML IJ SOLN
4.0000 mg | Freq: Once | INTRAMUSCULAR | Status: AC
  Administered 2023-09-22: 4 mg via INTRAVENOUS
  Filled 2023-09-22: qty 2

## 2023-09-22 NOTE — Discharge Instructions (Signed)
 Please follow-up closely with urology on an outpatient basis.  Return to emergency department immediately for any new or worsening symptoms.  Please start taking the Flomax  daily.

## 2023-09-22 NOTE — ED Triage Notes (Signed)
 Pt c/o of lower pelvic pain. States she is passing her kidney stone that she was told yesterday in the ED she had. States she has had 2 oxy 5-325 since 0400 this morning and it has not helped her pain.

## 2023-09-22 NOTE — ED Notes (Signed)
 Pt is sitting in the room waiting for the PA to come talk to her again.

## 2023-09-22 NOTE — ED Provider Notes (Signed)
 [facility] [place] EMERGENCY DEPARTMENT AT [facility] Provider Note   CSN: [id number] Arrival date & time: 09/22/23  9074     Patient presents with: Nephrolithiasis   Teresa Vincent is a 43 y.o. female.   Patient is a 44 year old female who presents to the emergency department the chief complaint of worsening right-sided abdominal and flank pain.  Patient was evaluated in the emergency department yesterday and diagnosed with a 6 x 6 x 5 proximal right-sided ureteral stone.  She notes that the pain has continued since being evaluated in the emergency department yesterday.  She notes that she does take Percocet chronically but notes that she only takes a half of a 5/325 mg daily.  She has taken 2 full doses of Percocet today with no improvement in her symptoms.  She denies any associated nausea or vomiting today.  She does note that she feels constipated as well.  She does note that she does have a history of previous ureteral stent and lithotripsy.        Prior to Admission medications   Medication Sig Start Date End Date Taking? Authorizing Provider  albuterol  (VENTOLIN  HFA) 108 (90 Base) MCG/ACT inhaler Inhale 1-2 puffs into the lungs every 4 (four) hours as needed for wheezing or shortness of breath. 08/29/21   Cook, Jayce G, DO  ALPRAZolam  (XANAX ) 0.5 MG tablet TAKE 1/2 TO 1 (ONE-HALF TO ONE) TABLET BY MOUTH TWICE DAILY AS NEEDED FOR ANXIETY 02/13/23   Cook, Jayce G, DO  Cholecalciferol 50 MCG (2000 UT) CAPS Take 1 capsule by mouth daily.    [provider]  cyclobenzaprine  (FLEXERIL ) 5 MG tablet Take 5 mg by mouth 3 (three) times daily as needed.    [provider]  ibuprofen  (ADVIL ) 600 MG tablet Take 1 tablet (600 mg total) by mouth every 8 (eight) hours as needed. 09/21/23   Nanavati, Ankit, MD  levothyroxine  (SYNTHROID ) 200 MCG tablet Take 1 tablet (200 mcg total) by mouth daily. 06/13/23   Nida, Gebreselassie W, MD  lisinopril  (ZESTRIL ) 20 MG tablet Take 1  tablet (20 mg total) by mouth daily. 06/05/23   Cook, Jayce G, DO  metoprolol  succinate (TOPROL -XL) 50 MG 24 hr tablet TAKE 1 TABLET DAILY 09/05/23   Cook, Jayce G, DO  omeprazole  (PRILOSEC) 40 MG capsule Take 1 capsule (40 mg total) by mouth 2 (two) times daily before a meal. 07/02/23   Shirlean Therisa ORN, NP  ondansetron  (ZOFRAN -ODT) 4 MG disintegrating tablet Take 1 tablet (4 mg total) by mouth every 8 (eight) hours as needed for nausea or vomiting. 10/03/22   Cook, Jayce G, DO  oxyCODONE -acetaminophen  (PERCOCET) 5-325 MG tablet Take 1 tablet by mouth 2 (two) times daily as needed for moderate pain (pain score 4-6). 09/18/23 09/17/24  Lorilee Sven SQUIBB, MD  rosuvastatin  (CRESTOR ) 20 MG tablet Take 1 tablet (20 mg total) by mouth at bedtime. 06/13/23   Nida, Gebreselassie W, MD  SUMAtriptan  (IMITREX ) 100 MG tablet TAKE 1 TABLET BY MOUTH AT THE FIRST SIGN OF MIGRAINE ADDITIONAL DOSE IN 2 HOURS IF HEADACHE PERSISTS OR RECURS 07/30/23   Mauro Elveria BROCKS, NP    Allergies: Bupivacaine , Contrast media [iodinated contrast media], Indomethacin , Adhesive [tape], and Celexa [citalopram hydrobromide]    Review of Systems  Gastrointestinal:  Positive for abdominal pain.    Updated Vital Signs BP (!) 124/50   Pulse 73   Temp 98.3 F (36.8 C) (Oral)   Ht 5' 7 (1.702 m)   Wt 116  kg   LMP 02/25/2016   SpO2 97%   BMI 40.05 kg/m   Physical Exam Vitals and nursing note reviewed.  Constitutional:      General: She is not in acute distress.    Appearance: Normal appearance. She is not ill-appearing.  HENT:     Head: Normocephalic and atraumatic.     Nose: Nose normal.     Mouth/Throat:     Mouth: Mucous membranes are moist.  Eyes:     Extraocular Movements: Extraocular movements intact.     Conjunctiva/sclera: Conjunctivae normal.     Pupils: Pupils are equal, round, and reactive to light.  Cardiovascular:     Rate and Rhythm: Normal rate and regular rhythm.     Pulses: Normal pulses.     Heart sounds:  Normal heart sounds. No murmur heard.    No gallop.  Pulmonary:     Effort: Pulmonary effort is normal. No respiratory distress.     Breath sounds: Normal breath sounds. No stridor. No wheezing, rhonchi or rales.  Abdominal:     General: Abdomen is flat. Bowel sounds are normal. There is no distension.     Palpations: Abdomen is soft.     Tenderness: There is no abdominal tenderness. There is no guarding.  Musculoskeletal:        General: Normal range of motion.     Cervical back: Normal range of motion and neck supple.  Skin:    General: Skin is warm and dry.  Neurological:     General: No focal deficit present.     Mental Status: She is alert and oriented to person, place, and time. Mental status is at baseline.  Psychiatric:        Mood and Affect: Mood normal.        Behavior: Behavior normal.        Thought Content: Thought content normal.        Judgment: Judgment normal.     (all labs ordered are listed, but only abnormal results are displayed) Labs Reviewed  COMPREHENSIVE METABOLIC PANEL WITH GFR  CBC WITH DIFFERENTIAL/PLATELET  URINALYSIS, ROUTINE W REFLEX MICROSCOPIC    EKG: None  Radiology: CT Renal Stone Study Result Date: 09/21/2023 CLINICAL DATA:  Abdominal flank pain.  History of kidney stones. EXAM: CT ABDOMEN AND PELVIS WITHOUT CONTRAST TECHNIQUE: Multidetector CT imaging of the abdomen and pelvis was performed following the standard protocol without IV contrast. RADIATION DOSE REDUCTION: This exam was performed according to the departmental dose-optimization program which includes automated exposure control, adjustment of the mA and/or kV according to patient size and/or use of iterative reconstruction technique. COMPARISON:  06/04/2019 FINDINGS: Lower chest: No acute findings. Hepatobiliary: The liver shows diffusely decreased attenuation suggesting fat deposition. Tiny hypodensity in segment IV is unchanged consistent with benign etiology. No followup imaging  is recommended. Gallbladder is surgically absent. No intrahepatic or extrahepatic biliary dilation. Pancreas: No focal mass lesion. No dilatation of the main duct. No intraparenchymal cyst. No peripancreatic edema. Spleen: No splenomegaly. No suspicious focal mass lesion. Adrenals/Urinary Tract: No adrenal nodule or mass. Left kidney and ureter unremarkable. Stable 2.7 cm exophytic water  density lesion lower pole right kidney consistent with a cyst. No followup imaging is recommended. 1 mm nonobstructing stone identified interpolar right kidney with mild right hydronephrosis secondary to the presence of a 6 x 6 x 5 mm stone at the right UPJ (42/2). Right ureter unremarkable. Bladder is decompressed. Stomach/Bowel: Stomach is unremarkable. No gastric wall thickening. No evidence  of outlet obstruction. Duodenum is normally positioned as is the ligament of Treitz. No small bowel wall thickening. No small bowel dilatation. The terminal ileum is normal. The appendix is normal. No gross colonic mass. No colonic wall thickening. Vascular/Lymphatic: There is mild atherosclerotic calcification of the abdominal aorta without aneurysm. There is no gastrohepatic or hepatoduodenal ligament lymphadenopathy. No retroperitoneal or mesenteric lymphadenopathy. No pelvic sidewall lymphadenopathy. Reproductive: There is no adnexal mass. Other: No intraperitoneal free fluid. Musculoskeletal: Status post lumbosacral fusion. No worrisome lytic or sclerotic osseous abnormality. IMPRESSION: 1. 6 x 6 x 5 mm stone at the right UPJ with mild right hydronephrosis. 2. 1 mm nonobstructing stone interpolar right kidney. 3. Hepatic steatosis. 4.  Aortic Atherosclerosis (ICD10-I70.0). Electronically Signed   By: Camellia Candle M.D.   On: 09/21/2023 08:11     Procedures   Medications Ordered in the ED  HYDROmorphone  (DILAUDID ) injection 1 mg (has no administration in time range)  ketorolac  (TORADOL ) 15 MG/ML injection 15 mg (has no  administration in time range)  ondansetron  (ZOFRAN ) injection 4 mg (has no administration in time range)  sodium chloride  0.9 % bolus 1,000 mL (has no administration in time range)                                    Medical Decision Making Amount and/or Complexity of Data Reviewed Labs: ordered.  Risk Prescription drug management.   This patient presents to the ED for concern of abdominal pain and flank pain differential diagnosis includes acute appendicitis, cholecystitis, bowel obstruction, diverticulitis, ovarian torsion cyst, PID, tumor and abscess, pyelonephritis, kidney stone, pancreatitis, mesenteric ischemia    Additional history obtained:  Additional history obtained from medical records External records from outside source obtained and reviewed including medical records   Lab Tests:  I Ordered, and personally interpreted labs.  The pertinent results include: No leukocytosis, no anemia, normal kidney function liver function, normal electrolytes, urinalysis with large blood   Medicines ordered and prescription drug management:  I ordered medication including Dilaudid , Toradol , IV fluids, Zofran , Flomax  for ureteral stone Reevaluation of the patient after these medicines showed that the patient improved I have reviewed the patients home medicines and have made adjustments as needed   Problem List / ED Course:  Patient is doing much better at this time and pain has completely resolved with treatment in the emergency department.  Patient's blood work has been overall unremarkable with no signs of acute kidney injury.  Urinalysis demonstrates no indication of urinary tract infection.  Did offer patient further symptomatic treatment on an outpatient basis though she has declined any further pain medication as she is already chronically on Percocet and will continue to take this.  Also discussed the need for taking the Motrin  that she was prescribed yesterday.  Will place  patient on Flomax  at this time.  Discussed the importance of close follow-up with urology on an outpatient basis.  Do not suspect that reimaging of the abdomen and pelvis is warranted at this time.  Patient has stable vital signs with no indication for sepsis.  Stroke return precautions were provided for any new or worsening symptoms.  Patient voiced understanding to the plan and had no additional questions.   Social Determinants of Health:  None        Final diagnoses:  None    ED Discharge Orders     None  Daralene Lonni BIRCH, PA-C 09/22/23 1148    Suzette Pac, MD 09/23/23 [phone]

## 2023-09-26 NOTE — Progress Notes (Unsigned)
 Name: Teresa Vincent DOB: 10-Jan-1981 MRN: [id number]  History of Present Illness: Teresa Vincent is a 43 y.o. female who presents today for follow up visit at [facility]. Relevant History includes: 1. Kidney stones. Prior ESWL and URS procedures in 2021.  At last visit with Dr. Sherrilee on 09/07/2021: - Seen for new onset nocturia and nocturnal enuresis.She has OSA but does not wear her CPAP due to poor fitment. - The plan was: 1. Patient counseled to wear her CPAP since here incontinence and nocturia is related to untreated OSA. 2. We will trial mirabegron  25mg  daily until she can get her CPAP mask fitted.  Recent history:  > 09/21/2023: - Seen in ER for right flank / abdominal pain. - UA/microscopy: >50 RBC/hpf, protein - CMP & CBC unremarkable. CT showed a 6 x 6 x 5 mm stone at the right UPJ with mild right hydronephrosis. Also a 1 mm nonobstructing stone interpolar right kidney. Prescription given for Ibuprofen  600 mg.  > 09/22/2023:  - Returned to ER for worsening right-sided abdominal and flank pain.  - She notes that she does take Percocet chronically but notes that she only takes a half of a 5/325 mg daily. She has taken 2 full doses of Percocet today with no improvement in her symptoms. She denies any associated nausea or vomiting today. She does note that she feels constipated as well. - Prescription given for Flomax  (Tamsulosin ) 0.4 mg daily.  Today: She {Actions; denies-reports:120008} recent stone migration / passage. She {Actions; denies-reports:120008} flank pain or abdominal pain. She {Actions; denies-reports:120008} fevers, nausea, or vomiting.  She {Actions; denies-reports:120008} increased urinary urgency, frequency, nocturia, dysuria, gross hematuria, hesitancy, straining to void, or sensations of incomplete emptying.   Medications: Current Outpatient Medications  Medication Sig Dispense Refill   albuterol  (VENTOLIN  HFA) 108 (90 Base)  MCG/ACT inhaler Inhale 1-2 puffs into the lungs every 4 (four) hours as needed for wheezing or shortness of breath. 18 g 3   ALPRAZolam  (XANAX ) 0.5 MG tablet TAKE 1/2 TO 1 (ONE-HALF TO ONE) TABLET BY MOUTH TWICE DAILY AS NEEDED FOR ANXIETY 30 tablet 3   Cholecalciferol 50 MCG (2000 UT) CAPS Take 1 capsule by mouth daily.     cyclobenzaprine  (FLEXERIL ) 5 MG tablet Take 5 mg by mouth 3 (three) times daily as needed.     ibuprofen  (ADVIL ) 600 MG tablet Take 1 tablet (600 mg total) by mouth every 8 (eight) hours as needed. 15 tablet 0   levothyroxine  (SYNTHROID ) 200 MCG tablet Take 1 tablet (200 mcg total) by mouth daily. 90 tablet 0   lisinopril  (ZESTRIL ) 20 MG tablet Take 1 tablet (20 mg total) by mouth daily. 90 tablet 2   metoprolol  succinate (TOPROL -XL) 50 MG 24 hr tablet TAKE 1 TABLET DAILY 90 tablet 1   omeprazole  (PRILOSEC) 40 MG capsule Take 1 capsule (40 mg total) by mouth 2 (two) times daily before a meal. 180 capsule 3   ondansetron  (ZOFRAN -ODT) 4 MG disintegrating tablet Take 1 tablet (4 mg total) by mouth every 8 (eight) hours as needed for nausea or vomiting. 20 tablet 5   oxyCODONE -acetaminophen  (PERCOCET) 5-325 MG tablet Take 1 tablet by mouth 2 (two) times daily as needed for moderate pain (pain score 4-6). 60 tablet 0   rosuvastatin  (CRESTOR ) 20 MG tablet Take 1 tablet (20 mg total) by mouth at bedtime. 90 tablet 0   SUMAtriptan  (IMITREX ) 100 MG tablet TAKE 1 TABLET BY MOUTH AT THE FIRST SIGN OF MIGRAINE ADDITIONAL  DOSE IN 2 HOURS IF HEADACHE PERSISTS OR RECURS 30 tablet 0   tamsulosin  (FLOMAX ) 0.4 MG CAPS capsule Take 1 capsule (0.4 mg total) by mouth daily. 14 capsule 0   No current facility-administered medications for this visit.    Allergies: Allergies  Allergen Reactions   Bupivacaine  Swelling    Pt received CT contrast and Bupivacaine  at the same time. Unsure which one caused the reaction. Dr. Derrill recommends a 13 hour prep before either one  bupivacaine    Contrast  Media [Iodinated Contrast Media] Swelling    Pt received CT contrast and Bupivacaine  at the same time. Unsure which one caused the reaction. Dr. Derrill recommends a 13 hour prep before either one   Indomethacin  Other (See Comments)    Dizziness, near syncope, bp went up and BG went down.   Adhesive [Tape] Other (See Comments)    Skin irritation, patient prefers paper tape.    Celexa [Citalopram Hydrobromide] Other (See Comments)    Make patient too carefree    Past Medical History:  Diagnosis Date   Abnormal Pap smear of cervix    ANXIETY 08/30/2007   Asthma    Chronic back pain    Claustrophobia    pt requests Versed  before surgery.   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   Past Surgical History:  Procedure Laterality Date   ABDOMINAL EXPOSURE N/A 05/29/2017   Procedure: ABDOMINAL EXPOSURE;  Surgeon: Eliza Lonni RAMAN, MD;  Location: [facility];  Service: Vascular;  Laterality: N/A;   ABDOMINAL HYSTERECTOMY     ANTERIOR LUMBAR FUSION N/A 05/29/2017   Procedure: Anterior Lumbar Interbody Fusion  - Lumbar five sacral one;  Surgeon: Joshua Alm RAMAN, MD;  Location: [facility] OR;  Service: Neurosurgery;  Laterality: N/A;   BIOPSY  02/17/2020   Procedure: BIOPSY;  Surgeon: Cindie Carlin POUR, DO;  Location: [facility];  Service: Endoscopy;;  gastric   CARPAL TUNNEL RELEASE Bilateral    CHOLECYSTECTOMY     CHOLECYSTECTOMY N/A 12/29/2016   Procedure: LAPAROSCOPIC CHOLECYSTECTOMY;  Surgeon: Kallie Manuelita BROCKS, MD;  Location: [facility];  Service: General;  Laterality: N/A;   COLONOSCOPY WITH PROPOFOL  N/A 02/17/2020   non-bleeding  internal hemorrhoids, otherwise normal.   CYSTOSCOPY/URETEROSCOPY/HOLMIUM LASER/STENT PLACEMENT Right 06/25/2019   Procedure: CYSTOSCOPY WITH RIGHT RETROGRADE PYELOGRAM/RIGHT URETEROSCOPY/HOLMIUM LASER APPLICATION RIGHT URETERAL CALCULUS/RIGHT URETERAL STENT PLACEMENT;  Surgeon: Sherrilee Belvie CROME, MD;  Location: [facility];  Service: Urology;  Laterality: Right;   ESOPHAGOGASTRODUODENOSCOPY (EGD) WITH PROPOFOL  N/A 02/17/2020   Procedure: ESOPHAGOGASTRODUODENOSCOPY (EGD) WITH PROPOFOL ;  Surgeon: Cindie Carlin POUR, DO;  Location: [facility];  Service: Endoscopy;  Laterality: N/A;   EXTRACORPOREAL SHOCK WAVE LITHOTRIPSY Right 04/10/2019   Procedure: EXTRACORPOREAL SHOCK WAVE LITHOTRIPSY (ESWL);  Surgeon: Elisabeth Valli BIRCH, MD;  Location: [facility];  Service: Urology;  Laterality: Right;   LAMINECTOMY WITH POSTERIOR LATERAL ARTHRODESIS LEVEL 1 N/A 02/28/2018   Procedure: Laminectomy and Foraminotomy Lumbar Five-Sacral One, posterolateral fusion and fixation;  Surgeon: Joshua Alm RAMAN, MD;  Location: [facility] OR;  Service: Neurosurgery;  Laterality: N/A;  posterior   LUMBAR EPIDURAL INJECTION     STERIOD INJECTION N/A 01/10/2017   Procedure: MINOR EXPAREL  INJECTION;  Surgeon: Kallie Manuelita BROCKS, MD;  Location: [facility];  Service: General;  Laterality: N/A;   TOTAL ABDOMINAL HYSTERECTOMY W/ BILATERAL SALPINGOOPHORECTOMY N/A    TUBAL LIGATION  2003   Family History  Problem Relation Age of Onset   Hypertension Mother    Thyroid  disease Neg Hx    Social History   Socioeconomic History   Marital status: Married    Spouse name: Not on file   Number of children: Not on file   Years of education: Not on file   Highest education level: Not on file  Occupational History   Occupation: Doesn't work outside the home  Tobacco Use   Smoking status: Former    Current packs/day: 0.00    Average packs/day: 1 pack/day for 26.0 years (26.0 ttl pk-yrs)    Types: Cigarettes    Start date: 01/08/1995     Quit date: 01/07/2021    Years since quitting: 2.7   Smokeless tobacco: Former  Building services engineer status: Every Day   Substances: Nicotine , Flavoring  Substance and Sexual Activity   Alcohol use: Yes    Alcohol/week: 0.0 standard drinks of alcohol    Comment: occasional   Drug use: Not Currently    Types: Marijuana    Comment: occasional   Sexual activity: Not Currently    Birth control/protection: Surgical  Other Topics Concern   Not on file  Social History Narrative   Not on file   Social Drivers of Health   Financial Resource Strain: Not on file  Food Insecurity: Not on file  Transportation Needs: Not on file  Physical Activity: Not on file  Stress: Not on file  Social Connections: Not on file  Intimate Partner Violence: Not on file    SUBJECTIVE  Review of Systems Constitutional: Patient denies any unintentional weight loss or change in strength lntegumentary: Patient denies any rashes or pruritus Cardiovascular: Patient denies chest pain or syncope Respiratory: Patient denies shortness of breath Gastrointestinal: ***Patient denies nausea, vomiting, constipation, or diarrhea ***As per HPI Musculoskeletal: Patient denies muscle cramps or weakness Neurologic: Patient denies convulsions or seizures Allergic/Immunologic: Patient denies recent allergic reaction(s) Hematologic/Lymphatic: Patient denies bleeding tendencies Endocrine: Patient denies heat/cold intolerance  GU: As per HPI.  OBJECTIVE There were no vitals filed for this visit. There is no height or weight on file to calculate BMI.  Physical Examination Constitutional: No obvious distress; patient is non-toxic appearing  Cardiovascular: No visible lower extremity edema.  Respiratory: The patient does not have audible wheezing/stridor; respirations do not appear labored  Gastrointestinal: Abdomen non-distended Musculoskeletal: Normal ROM of UEs  Skin: No obvious rashes/open sores  Neurologic: CN  2-12 grossly intact Psychiatric: Answered questions appropriately with normal affect  Hematologic/Lymphatic/Immunologic: No obvious bruises or sites of spontaneous bleeding  UA: ***negative ***positive for *** leukocytes, *** blood, ***nitrites Urine microscopy: *** WBC/hpf, *** RBC/hpf, *** bacteria ***glucosuria (secondary to ***Jardiance ***Farxiga use) ***otherwise unremarkable  PVR: *** ml  ASSESSMENT No diagnosis found.  ***We reviewed recent imaging results; ***awaiting radiology results, appears to have ***no acute findings per provider interpretation.  ***For stone prevention: Advised adequate hydration and we discussed option to consider low oxalate diet given that calcium  oxalate is the most common type of stone. Handout provided about stone prevention diet.  ***For recurrent stone formers: We discussed option to proceed with  24 hour urinalysis ([facility]) for metabolic stone evaluation, which may help with targeted recommendations for dietary I medication therapies for stone prevention. Patient elected to ***proceed/ ***hold off.  Will plan to follow up in ***6 months / ***1 year with ***KUB ***RUS for stone surveillance or sooner if needed.  Patient verbalized understanding of and agreement with current plan. All questions were answered.  PLAN Advised the following: Maintain adequate fluid intake daily. Drink citrus juice (lemon, lime or orange juice) routinely. Low oxalate diet. No follow-ups on file.  No orders of the defined types were placed in this encounter.   It has been explained that the patient is to follow regularly with their PCP in addition to all other providers involved in their care and to follow instructions provided by these respective offices. Patient advised to contact urology clinic if any urologic-pertaining questions, concerns, new symptoms or problems arise in the interim period.  There are no Patient Instructions on file for this  visit.  Electronically signed by:  Lauraine KYM Oz, MSN, FNP-C, CUNP 09/26/2023 1:12 PM

## 2023-09-27 ENCOUNTER — Ambulatory Visit (HOSPITAL_COMMUNITY)
Admission: RE | Admit: 2023-09-27 | Discharge: 2023-09-27 | Disposition: A | Discharge status: Home / Self Care | Source: Ambulatory Visit | Attending: Urology | Admitting: Urology

## 2023-09-27 ENCOUNTER — Ambulatory Visit: Admitting: Urology

## 2023-09-27 ENCOUNTER — Encounter: Payer: Self-pay | Admitting: Urology

## 2023-09-27 VITALS — BP 125/82 | HR 67

## 2023-09-27 DIAGNOSIS — R109 Unspecified abdominal pain: Secondary | ICD-10-CM | POA: Diagnosis not present

## 2023-09-27 DIAGNOSIS — N200 Calculus of kidney: Secondary | ICD-10-CM | POA: Diagnosis not present

## 2023-09-27 DIAGNOSIS — N1330 Unspecified hydronephrosis: Secondary | ICD-10-CM | POA: Diagnosis not present

## 2023-09-27 DIAGNOSIS — N201 Calculus of ureter: Secondary | ICD-10-CM | POA: Diagnosis not present

## 2023-09-27 DIAGNOSIS — Z981 Arthrodesis status: Secondary | ICD-10-CM | POA: Diagnosis not present

## 2023-09-27 DIAGNOSIS — N202 Calculus of kidney with calculus of ureter: Secondary | ICD-10-CM | POA: Diagnosis not present

## 2023-09-27 LAB — MICROSCOPIC EXAMINATION: Epithelial Cells (non renal): >10 /HPF — AB (ref 0–10)

## 2023-09-27 LAB — URINALYSIS, ROUTINE W REFLEX MICROSCOPIC
Bilirubin, UA: NEGATIVE
Glucose, UA: NEGATIVE
Ketones, UA: NEGATIVE
Leukocytes,UA: NEGATIVE
Nitrite, UA: NEGATIVE
Specific Gravity, UA: 1.02 (ref 1.005–1.030)
Urobilinogen, Ur: 2 mg/dL — ABNORMAL HIGH (ref 0.2–1.0)
pH, UA: 7 (ref 5.0–7.5)

## 2023-09-29 ENCOUNTER — Emergency Department (HOSPITAL_COMMUNITY)
Admission: EM | Admit: 2023-09-29 | Discharge: 2023-09-29 | Disposition: A | Discharge status: Home / Self Care | Attending: Emergency Medicine | Admitting: Emergency Medicine

## 2023-09-29 ENCOUNTER — Emergency Department (HOSPITAL_COMMUNITY)

## 2023-09-29 ENCOUNTER — Encounter (HOSPITAL_COMMUNITY): Payer: Self-pay | Admitting: Emergency Medicine

## 2023-09-29 DIAGNOSIS — I10 Essential (primary) hypertension: Secondary | ICD-10-CM | POA: Diagnosis not present

## 2023-09-29 DIAGNOSIS — Z7951 Long term (current) use of inhaled steroids: Secondary | ICD-10-CM | POA: Diagnosis not present

## 2023-09-29 DIAGNOSIS — N281 Cyst of kidney, acquired: Secondary | ICD-10-CM | POA: Diagnosis not present

## 2023-09-29 DIAGNOSIS — R109 Unspecified abdominal pain: Secondary | ICD-10-CM

## 2023-09-29 DIAGNOSIS — N200 Calculus of kidney: Secondary | ICD-10-CM

## 2023-09-29 DIAGNOSIS — J45909 Unspecified asthma, uncomplicated: Secondary | ICD-10-CM | POA: Insufficient documentation

## 2023-09-29 DIAGNOSIS — Z87891 Personal history of nicotine dependence: Secondary | ICD-10-CM | POA: Diagnosis not present

## 2023-09-29 DIAGNOSIS — Z79899 Other long term (current) drug therapy: Secondary | ICD-10-CM | POA: Diagnosis not present

## 2023-09-29 DIAGNOSIS — R1031 Right lower quadrant pain: Secondary | ICD-10-CM | POA: Diagnosis not present

## 2023-09-29 DIAGNOSIS — N132 Hydronephrosis with renal and ureteral calculous obstruction: Secondary | ICD-10-CM | POA: Insufficient documentation

## 2023-09-29 DIAGNOSIS — K760 Fatty (change of) liver, not elsewhere classified: Secondary | ICD-10-CM | POA: Diagnosis not present

## 2023-09-29 LAB — CBC WITH DIFFERENTIAL/PLATELET
Abs Immature Granulocytes: 0.04 K/uL (ref 0.00–0.07)
Basophils Absolute: 0.1 K/uL (ref 0.0–0.1)
Basophils Relative: 1 %
Eosinophils Absolute: 0.2 K/uL (ref 0.0–0.5)
Eosinophils Relative: 3 %
HCT: 41.8 % (ref 36.0–46.0)
Hemoglobin: 14.4 g/dL (ref 12.0–15.0)
Immature Granulocytes: 1 %
Lymphocytes Relative: 30 %
Lymphs Abs: 2.2 K/uL (ref 0.7–4.0)
MCH: 30.4 pg (ref 26.0–34.0)
MCHC: 34.4 g/dL (ref 30.0–36.0)
MCV: 88.2 fL (ref 80.0–100.0)
Monocytes Absolute: 0.6 K/uL (ref 0.1–1.0)
Monocytes Relative: 7 %
Neutro Abs: 4.4 K/uL (ref 1.7–7.7)
Neutrophils Relative %: 58 %
Platelets: 263 K/uL (ref 150–400)
RBC: 4.74 MIL/uL (ref 3.87–5.11)
RDW: 12.7 % (ref 11.5–15.5)
WBC: 7.4 K/uL (ref 4.0–10.5)
nRBC: 0 % (ref 0.0–0.2)

## 2023-09-29 LAB — URINALYSIS, ROUTINE W REFLEX MICROSCOPIC
Bacteria, UA: NONE SEEN
Bilirubin Urine: NEGATIVE
Glucose, UA: NEGATIVE mg/dL
Ketones, ur: NEGATIVE mg/dL
Leukocytes,Ua: NEGATIVE
Nitrite: NEGATIVE
Protein, ur: NEGATIVE mg/dL
RBC / HPF: >50 RBC/hpf (ref 0–5)
Specific Gravity, Urine: 1.01 (ref 1.005–1.030)
pH: 7 (ref 5.0–8.0)

## 2023-09-29 LAB — COMPREHENSIVE METABOLIC PANEL WITH GFR
ALT: 21 U/L (ref 0–44)
AST: 19 U/L (ref 15–41)
Albumin: 3.9 g/dL (ref 3.5–5.0)
Alkaline Phosphatase: 69 U/L (ref 38–126)
Anion gap: 12 (ref 5–15)
BUN: 12 mg/dL (ref 6–20)
CO2: 24 mmol/L (ref 22–32)
Calcium: 9.4 mg/dL (ref 8.9–10.3)
Chloride: 102 mmol/L (ref 98–111)
Creatinine, Ser: 0.74 mg/dL (ref 0.44–1.00)
GFR, Estimated: >60 mL/min (ref >60)
Glucose, Bld: 117 mg/dL — ABNORMAL HIGH (ref 70–99)
Potassium: 3.9 mmol/L (ref 3.5–5.1)
Sodium: 138 mmol/L (ref 135–145)
Total Bilirubin: 0.6 mg/dL (ref 0.0–1.2)
Total Protein: 7.2 g/dL (ref 6.5–8.1)

## 2023-09-29 LAB — LIPASE, BLOOD: Lipase: 39 U/L (ref 11–51)

## 2023-09-29 MED ORDER — HYDROMORPHONE HCL 1 MG/ML IJ SOLN
1.0000 mg | Freq: Once | INTRAMUSCULAR | Status: AC
  Administered 2023-09-29: 1 mg via INTRAVENOUS
  Filled 2023-09-29 (×2): qty 1

## 2023-09-29 MED ORDER — KETOROLAC TROMETHAMINE 15 MG/ML IJ SOLN
15.0000 mg | Freq: Once | INTRAMUSCULAR | Status: AC
  Administered 2023-09-29: 15 mg via INTRAVENOUS
  Filled 2023-09-29: qty 1

## 2023-09-29 MED ORDER — ONDANSETRON HCL 4 MG/2ML IJ SOLN
4.0000 mg | Freq: Once | INTRAMUSCULAR | Status: AC
  Administered 2023-09-29: 4 mg via INTRAVENOUS
  Filled 2023-09-29: qty 2

## 2023-09-29 MED ORDER — SODIUM CHLORIDE 0.9 % IV BOLUS
1000.0000 mL | Freq: Once | INTRAVENOUS | Status: AC
  Administered 2023-09-29: 1000 mL via INTRAVENOUS

## 2023-09-29 MED ORDER — KETOROLAC TROMETHAMINE 10 MG PO TABS: 10.0000 mg | ORAL_TABLET | Freq: Four times a day (QID) | ORAL | 0 refills | Status: DC | PRN

## 2023-09-29 NOTE — ED Notes (Signed)
 Pt ambulated to restroom without difficulty. Urine sample obtained. Kellogg RN

## 2023-09-29 NOTE — ED Provider Notes (Signed)
 [place] [facility] DEPARTMENT AT [facility] Provider Note  CSN: [id number] Arrival date & time: 09/29/23 9146  Chief Complaint(s) Nephrolithiasis  HPI Teresa Vincent is a 43 y.o. female with past medical history as below, significant for GERD< migraine, obesity ,osa on cpap, nephrolithiasis who presents to the ED with complaint of low abd pain/pelvic pain.  She was recently diagnosed with a kidney stone, she reports the pain has been migrating down her right lower quadrant into her pelvis region.  Thinks the stone is stuck in her bladder.  She is having significant pain that is unrelieved with her home pain medications.  Saw urology on 7/10, having similar symptoms.  Unable to tolerate Flomax  secondary to hypotension per patient.  Was advised to return to the urology clinic in around 2 weeks.  Here with ongoing pain, no significant movement, reports she has get back to work on Monday and needs the stone out by then otherwise she might lose her job   Past Medical History Past Medical History:  Diagnosis Date   Abnormal Pap smear of cervix    ANXIETY 08/30/2007   Asthma    Chronic back pain    Claustrophobia    pt requests Versed  before surgery.   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 Authorizing Provider  ketorolac  (TORADOL ) 10 MG tablet Take 1 tablet (10 mg total) by mouth every 6 (six) hours as needed. 09/29/23  Yes Elnor Savant A, DO  albuterol  (VENTOLIN  HFA) 108 (90 Base) MCG/ACT inhaler Inhale 1-2 puffs into the lungs every 4 (four) hours as needed for wheezing or shortness of breath. 08/29/21   Cook, Jayce G, DO  ALPRAZolam  (XANAX ) 0.5 MG tablet TAKE 1/2 TO 1 (ONE-HALF TO ONE) TABLET BY MOUTH TWICE DAILY AS NEEDED FOR ANXIETY 02/13/23   Cook, Jayce G, DO  Cholecalciferol 50 MCG (2000 UT) CAPS Take 1 capsule by mouth daily.    [provider]  cyclobenzaprine  (FLEXERIL ) 5 MG tablet Take 5 mg by mouth 3 (three) times daily as needed.    [provider]  levothyroxine  (SYNTHROID ) 200 MCG tablet Take 1 tablet (200 mcg total) by mouth daily. Patient taking differently: Take 200 mcg by mouth daily. Taking 150 mcg 06/13/23   Nida, Gebreselassie W, MD  lisinopril  (ZESTRIL ) 20 MG tablet Take 1 tablet (20 mg total) by mouth daily. 06/05/23   Cook, Jayce G, DO  metoprolol  succinate (TOPROL -XL) 50 MG 24 hr tablet TAKE 1 TABLET DAILY 09/05/23   Cook, Jayce G, DO  omeprazole  (PRILOSEC) 40 MG capsule Take 1 capsule (40 mg total) by mouth 2 (two) times daily before a meal. 07/02/23   Shirlean Therisa ORN, NP  ondansetron  (ZOFRAN -ODT) 4 MG disintegrating tablet Take 1 tablet (4 mg total) by mouth every 8 (eight) hours as needed for nausea or vomiting. 10/03/22   Cook, Jayce G, DO  oxyCODONE -acetaminophen   (PERCOCET) 5-325 MG tablet Take 1 tablet by mouth 2 (two) times daily as needed for moderate pain (pain score 4-6). 09/18/23 09/17/24  Lorilee Sven SQUIBB, MD  rosuvastatin  (CRESTOR ) 20 MG tablet Take 1 tablet (20 mg total) by mouth at bedtime. 06/13/23   Nida, Gebreselassie W, MD  SUMAtriptan  (IMITREX ) 100 MG tablet TAKE 1 TABLET BY MOUTH AT THE FIRST SIGN OF MIGRAINE ADDITIONAL DOSE IN 2 HOURS IF HEADACHE PERSISTS OR RECURS 07/30/23   Mauro Elveria BROCKS, NP  tamsulosin  (FLOMAX ) 0.4 MG CAPS capsule Take 1 capsule (0.4 mg total) by mouth daily. Patient not taking: Reported on 09/27/2023 09/22/23   Daralene Lonni BIRCH, PA-C                                                                                                                                    Past Surgical History Past Surgical History:  Procedure Laterality Date   ABDOMINAL EXPOSURE N/A 05/29/2017   Procedure: ABDOMINAL EXPOSURE;  Surgeon: Eliza Lonni RAMAN, MD;  Location: [facility] OR;  Service: Vascular;  Laterality: N/A;   ABDOMINAL HYSTERECTOMY     ANTERIOR LUMBAR FUSION N/A 05/29/2017   Procedure: Anterior Lumbar Interbody Fusion  - Lumbar five sacral one;  Surgeon: Joshua Alm RAMAN, MD;  Location: [facility] OR;  Service: Neurosurgery;  Laterality: N/A;   BIOPSY  02/17/2020   Procedure: BIOPSY;  Surgeon: Cindie Carlin POUR, DO;  Location: [facility];  Service: Endoscopy;;  gastric   CARPAL TUNNEL RELEASE Bilateral    CHOLECYSTECTOMY     CHOLECYSTECTOMY N/A 12/29/2016   Procedure: LAPAROSCOPIC CHOLECYSTECTOMY;  Surgeon: Kallie Manuelita BROCKS, MD;  Location: [facility] ORS;  Service: General;  Laterality: N/A;   COLONOSCOPY WITH PROPOFOL  N/A 02/17/2020   non-bleeding internal hemorrhoids, otherwise normal.   CYSTOSCOPY/URETEROSCOPY/HOLMIUM LASER/STENT PLACEMENT Right 06/25/2019   Procedure: CYSTOSCOPY WITH RIGHT RETROGRADE PYELOGRAM/RIGHT URETEROSCOPY/HOLMIUM LASER APPLICATION RIGHT URETERAL CALCULUS/RIGHT URETERAL STENT PLACEMENT;  Surgeon: Sherrilee Belvie CROME,  MD;  Location: [facility] ORS;  Service: Urology;  Laterality: Right;   ESOPHAGOGASTRODUODENOSCOPY (EGD) WITH PROPOFOL  N/A 02/17/2020   Procedure: ESOPHAGOGASTRODUODENOSCOPY (EGD) WITH PROPOFOL ;  Surgeon: Cindie Carlin POUR, DO;  Location: [facility];  Service: Endoscopy;  Laterality: N/A;   EXTRACORPOREAL SHOCK WAVE LITHOTRIPSY Right 04/10/2019   Procedure: EXTRACORPOREAL SHOCK WAVE LITHOTRIPSY (ESWL);  Surgeon: Elisabeth Valli BIRCH, MD;  Location: [facility];  Service: Urology;  Laterality: Right;   LAMINECTOMY WITH POSTERIOR LATERAL ARTHRODESIS LEVEL 1 N/A 02/28/2018   Procedure: Laminectomy and Foraminotomy Lumbar Five-Sacral One, posterolateral fusion and fixation;  Surgeon: Joshua Alm RAMAN, MD;  Location: [facility] OR;  Service: Neurosurgery;  Laterality: N/A;  posterior   LUMBAR EPIDURAL INJECTION     STERIOD INJECTION N/A 01/10/2017   Procedure: MINOR EXPAREL  INJECTION;  Surgeon: Kallie Manuelita BROCKS, MD;  Location: [facility] ORS;  Service: General;  Laterality: N/A;   TOTAL ABDOMINAL HYSTERECTOMY W/ BILATERAL SALPINGOOPHORECTOMY N/A    TUBAL LIGATION  2003   Family History Family History  Problem Relation Age of Onset   Hypertension Mother    Thyroid  disease Neg Hx     Social History Social History   Tobacco Use   Smoking status: Former    Current packs/day: 0.00    Average packs/day: 1 pack/day for 26.0 years (26.0 ttl pk-yrs)    Types: Cigarettes    Start date: 01/08/1995    Quit date: 01/07/2021    Years since quitting: 2.7   Smokeless tobacco: Former  Building services engineer status: Every Day   Substances: Nicotine , Flavoring  Substance Use Topics   Alcohol use: Yes    Alcohol/week: 0.0 standard drinks of alcohol    Comment: occasional   Drug use: Not Currently    Types: Marijuana    Comment: occasional   Allergies Bupivacaine , Contrast media [iodinated contrast media], Indomethacin , Adhesive [tape], and Celexa [citalopram hydrobromide]  Review of Systems A thorough  review of systems was obtained and all systems are negative except as noted in the HPI and PMH.   Physical Exam Vital Signs  I have reviewed the triage vital signs BP 119/75   Pulse 70   Temp 98.2 F (36.8 C) (Oral)   Resp 18   Ht 5' 7 (1.702 m)   Wt 116 kg   LMP 02/25/2016   SpO2 95%   BMI 40.05 kg/m  Physical Exam Vitals and nursing note reviewed.  Constitutional:      General: She is not in acute distress.    Appearance: Normal appearance. She is obese.  HENT:     Head: Normocephalic and atraumatic.     Right Ear: External ear normal.     Left Ear: External ear normal.     Nose: Nose normal.     Mouth/Throat:     Mouth: Mucous membranes are moist.  Eyes:     General: No scleral icterus.       Right eye: No discharge.        Left eye: No discharge.  Cardiovascular:     Rate and Rhythm: Normal rate.     Pulses: Normal pulses.  Pulmonary:     Effort: Pulmonary effort is normal. No respiratory distress.     Breath sounds: No stridor.  Abdominal:     General: Abdomen is flat. There is no distension.     Palpations: Abdomen is soft.  Musculoskeletal:     Cervical back: No rigidity.     Right lower leg: No edema.     Left lower leg: No edema.  Skin:    General: Skin is warm and dry.     Capillary Refill: Capillary refill takes less than 2 seconds.  Neurological:     Mental Status: She is alert.  Psychiatric:        Mood and Affect: Mood normal.  Behavior: Behavior normal. Behavior is cooperative.     ED Results and Treatments Labs (all labs ordered are listed, but only abnormal results are displayed) Labs Reviewed  COMPREHENSIVE METABOLIC PANEL WITH GFR - Abnormal; Notable for the following components:      Result Value   Glucose, Bld 117 (*)    All other components within normal limits  URINALYSIS, ROUTINE W REFLEX MICROSCOPIC - Abnormal; Notable for the following components:   Color, Urine STRAW (*)    Hgb urine dipstick MODERATE (*)    All  other components within normal limits  CBC WITH DIFFERENTIAL/PLATELET  LIPASE, BLOOD                                                                                                                          Radiology CT Renal Stone Study Result Date: 09/29/2023 CLINICAL DATA:  Abdominal and flank pain.  Nephrolithiasis. EXAM: CT ABDOMEN AND PELVIS WITHOUT CONTRAST TECHNIQUE: Multidetector CT imaging of the abdomen and pelvis was performed following the standard protocol without IV contrast. RADIATION DOSE REDUCTION: This exam was performed according to the departmental dose-optimization program which includes automated exposure control, adjustment of the mA and/or kV according to patient size and/or use of iterative reconstruction technique. COMPARISON:  09/21/2023 FINDINGS: Lower chest: No acute findings. Hepatobiliary: No mass visualized on this unenhanced exam. Mild diffuse hepatic steatosis again noted. Prior cholecystectomy. No evidence of biliary obstruction. Pancreas: No mass or inflammatory process visualized on this unenhanced exam. Spleen:  Within normal limits in size. Adrenals/Urinary tract: Punctate right renal calculus again seen as well as small subcapsular right renal cyst. Mild-to-moderate right hydroureteronephrosis is again seen, with 4 mm calculus now migrated further into the distal right ureter. Unremarkable unopacified urinary bladder. Stomach/Bowel: No evidence of obstruction, inflammatory process, or abnormal fluid collections. A 2 cm well-circumscribed soft tissue density is again seen in the right lower quadrant adjacent to small bowel which is unchanged dating back to prior studies from 2018, consistent with benign etiology. Vascular/Lymphatic: No pathologically enlarged lymph nodes identified. No evidence of abdominal aortic aneurysm. Reproductive: Prior hysterectomy noted. Adnexal regions are unremarkable in appearance. Other:  None. Musculoskeletal: No suspicious bone lesions  identified. Prior fusion again noted at L5-S1. IMPRESSION: Mild-to-moderate right hydroureteronephrosis, with 4 mm calculus now migrated further into the distal right ureter. Punctate right renal calculus. Stable mild hepatic steatosis. Electronically Signed   By: Norleen DELENA Kil M.D.   On: 09/29/2023 10:38    Pertinent labs & imaging results that were available during my care of the patient were reviewed by me and considered in my medical decision making (see MDM for details).  Medications Ordered in ED Medications  sodium chloride  0.9 % bolus 1,000 mL (1,000 mLs Intravenous New Bag/Given 09/29/23 1001)  ondansetron  (ZOFRAN ) injection 4 mg (4 mg Intravenous Given 09/29/23 1001)  HYDROmorphone  (DILAUDID ) injection 1 mg (1 mg Intravenous Given 09/29/23 1034)  ketorolac  (TORADOL ) 15 MG/ML injection 15 mg (15 mg Intravenous Given  09/29/23 1003)                                                                                                                                     Procedures Procedures  (including critical care time)  Medical Decision Making / ED Course    Medical Decision Making:    Teresa Vincent is a 43 y.o. female with past medical history as below, significant for GERD< migraine, obesity ,osa on cpap, nephrolithiasis, hysterectomy who presents to the ED with complaint of low abd pain/pelvic pain.. The complaint involves an extensive differential diagnosis and also carries with it a high risk of complications and morbidity.  Serious etiology was considered. Ddx includes but is not limited to: Differential diagnosis includes but is not exclusive to , ovarian cyst, ovarian torsion, acute appendicitis, urinary tract infection, endometriosis, bowel obstruction, hernia, colitis, renal colic, gastroenteritis, volvulus etc.   Complete initial physical exam performed, notably the patient was in no distress, abdomen nonperitoneal, afebrile.    Reviewed and confirmed nursing documentation  for past medical history, family history, social history.  Vital signs reviewed.    Abdominal pain Nephrolithiasis > - ongoing RLQ pain, now feels it is in her bladder, similar to prior nephrolithiasis, no dysuria or hematuria, no vomiting or fever - recheck CT renal (contrast allergy noted) > progression of nephrolithiasis - feeling better w/ pain meds - labs: Stable   Clinical Course as of 09/29/23 1320  Sat Sep 29, 2023  1155 Feeling better, pain well controlled  [SG]  1306 Bacteria, UA: NONE SEEN No evidence of infection on ua [SG]    Clinical Course User Index [SG] Elnor Savant A, DO     Symptoms improved, stone seems to be progressing. Ambulatory, tolerating po, no n/v. She was restarted on flomax  by urology, will add toradol , precautions advised. Advised upon rehydration, close return precautions, f/u urology in office  Patient in no distress and overall condition is stable. Detailed discussions were had with the patient/guardian regarding current findings, and need for close f/u with PCP or on call doctor. The patient/guardian has been instructed to return immediately if the symptoms worsen in any way for re-evaluation. Patient/guardian verbalized understanding and is in agreement with current care plan. All questions answered prior to discharge.                Additional history obtained: -Additional history obtained from na -External records from outside source obtained and reviewed including: Chart review including previous notes, labs, imaging, consultation notes including  Prior labs Prior imaging Urology note, ED note   Lab Tests: -I ordered, reviewed, and interpreted labs.   The pertinent results include:   Labs Reviewed  COMPREHENSIVE METABOLIC PANEL WITH GFR - Abnormal; Notable for the following components:      Result Value   Glucose, Bld 117 (*)    All other components within normal limits  URINALYSIS, ROUTINE W  REFLEX MICROSCOPIC - Abnormal;  Notable for the following components:   Color, Urine STRAW (*)    Hgb urine dipstick MODERATE (*)    All other components within normal limits  CBC WITH DIFFERENTIAL/PLATELET  LIPASE, BLOOD    Notable for labs stable  EKG   EKG Interpretation Date/Time:    Ventricular Rate:    PR Interval:    QRS Duration:    QT Interval:    QTC Calculation:   R Axis:      Text Interpretation:           Imaging Studies ordered: I ordered imaging studies including ct renal I independently visualized the following imaging with scope of interpretation limited to determining acute life threatening conditions related to emergency care; findings noted above I agree with the radiologist interpretation If any imaging was obtained with contrast I closely monitored patient for any possible adverse reaction a/w contrast administration in the emergency department   Medicines ordered and prescription drug management: Meds ordered this encounter  Medications   sodium chloride  0.9 % bolus 1,000 mL   ondansetron  (ZOFRAN ) injection 4 mg   HYDROmorphone  (DILAUDID ) injection 1 mg   ketorolac  (TORADOL ) 15 MG/ML injection 15 mg   ketorolac  (TORADOL ) 10 MG tablet    Sig: Take 1 tablet (10 mg total) by mouth every 6 (six) hours as needed.    Dispense:  15 tablet    Refill:  0    -I have reviewed the patients home medicines and have made adjustments as needed   Consultations Obtained: na   Cardiac Monitoring: Continuous pulse oximetry interpreted by myself, 97% on ra.    Social Determinants of Health:  Diagnosis or treatment significantly limited by social determinants of health: former smoker and obesity   Reevaluation: After the interventions noted above, I reevaluated the patient and found that they have improved  Co morbidities that complicate the patient evaluation  Past Medical History:  Diagnosis Date   Abnormal Pap smear of cervix    ANXIETY 08/30/2007   Asthma    Chronic back  pain    Claustrophobia    pt requests Versed  before surgery.   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      Dispostion: Disposition decision including need for hospitalization was considered, and patient discharged from emergency department.    Final Clinical Impression(s) / ED Diagnoses Final diagnoses:  Nephrolithiasis  Abdominal pain, unspecified abdominal location        Elnor Jayson LABOR, DO 09/29/23 1320

## 2023-09-29 NOTE — ED Triage Notes (Signed)
 Pt endorses pelvic pain and known kidney stones. States she saw her urologist Thursday and had xray but has not heard back from them. Also sees pain management. Pt not taking flomax  due to it dropping her BP.

## 2023-09-29 NOTE — ED Notes (Signed)
 Dr Elnor at bedside. Kellogg RN

## 2023-09-29 NOTE — Discharge Instructions (Addendum)
 It was a pleasure caring for you today in the emergency department.  Please follow up with urology in the office  Be sure to drink plenty of fluids, get plenty of rest  Please do not take Toradol  with any other NSAID medications such as ibuprofen , Motrin , Aleve , naproxen , Mobic , diclofenac , aspirin, etc.  Please return to the emergency department for any worsening or worrisome symptoms.

## 2023-10-01 ENCOUNTER — Telehealth: Payer: Self-pay | Admitting: Physical Medicine and Rehabilitation

## 2023-10-01 ENCOUNTER — Ambulatory Visit: Payer: Self-pay | Admitting: Urology

## 2023-10-01 ENCOUNTER — Telehealth: Payer: Self-pay | Admitting: Urology

## 2023-10-01 DIAGNOSIS — G894 Chronic pain syndrome: Secondary | ICD-10-CM

## 2023-10-01 NOTE — Telephone Encounter (Signed)
 Called pt to give CT results per NP pt voiced her understanding and confirmed 07/16 appt for US 

## 2023-10-01 NOTE — Telephone Encounter (Signed)
 Pt called to let her know once her US  is completed and resulted NP or MD can f/u w/ treatment plans for stone pt voiced her understanding as well asked if she could get her [facility] results pt was notified that a message will be sent to provider pt voiced her understanding

## 2023-10-01 NOTE — Telephone Encounter (Signed)
 Patient left message wanting results from [facility]. She went to the ER 7/12

## 2023-10-01 NOTE — Telephone Encounter (Signed)
 Patient is wanting to talk to Dr. Lorilee regarding tomorrow's appt. She is currently in pain and discomfort due to a kidney stone.

## 2023-10-01 NOTE — Telephone Encounter (Signed)
 Patient is needing to have kidney stone taken out. Had to go to ED this past weekend.  Wanting the stone taken out.   Will need to go on FMLA due to pain.  Bringing paperwork.    Call:  [phone]

## 2023-10-02 ENCOUNTER — Encounter: Payer: Self-pay | Admitting: Physical Medicine and Rehabilitation

## 2023-10-02 ENCOUNTER — Encounter
Payer: BC Managed Care – PPO | Attending: Physical Medicine and Rehabilitation | Admitting: Physical Medicine and Rehabilitation

## 2023-10-02 VITALS — BP 117/76 | HR 75 | Ht 67.0 in | Wt 260.6 lb

## 2023-10-02 DIAGNOSIS — N200 Calculus of kidney: Secondary | ICD-10-CM | POA: Insufficient documentation

## 2023-10-02 MED ORDER — OXYCODONE-ACETAMINOPHEN 5-325 MG PO TABS: 1.0000 | ORAL_TABLET | Freq: Two times a day (BID) | ORAL | 0 refills | Status: DC | PRN

## 2023-10-02 NOTE — Progress Notes (Signed)
 Subjective:    Patient ID: Teresa Vincent, female    DOB: [birth year], 43 y.o.   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  -she contacted her spinal physician again and was told she can only have the injection every three months -she was advised to do dry needling once per week for 6 weeks and this does not provide much benefit -tens unit helps temporarily -she has been feeling intermittent sensations for a few months; -feels like a tingling, burning, cooling sensation in lumbar area -has been getting worse she thinks because she has been standing -she thinks something is pinched or a screw was dislodged -she has been super busy at work- has not had a day of since August 15th since she picked up a 2nd job -she needs to go see her mom tomorrow -she was also doing soup kitchen with her church -this is her first weekend and she plans on relaxing -has a tens unit and it does help but if the pain is through the roof then it doesn't help -she tries to use the tens three times per day Teresa Vincent is a 43 year old woman who presents to establish care for left sided lower back pain that radiates into her left leg.  -she felt no benefit from going to the chiropractor.  -pain has been severe recently -twice a month she is working 7 days a week and she talked with her boss's boss about slowing down a little and he is seeing if this possible.  -she takes care of the garden center at [employer] -Pain has been present for years -She has had two back surgeries.  -She has had 7 injections but got no relief from these.  -She has gone back to Dr. Joshua who did her surgery.  -She was on oxycodone  5mg  that she broke in half to 2.5s which helped. This helped her to get through the day. She has been on it for years. She has been  following with Fidela.  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  -feels better when laying down, but also painful while sitting -today has been a bad day -she uses her electric blanket and heating pad -she has been working at [employer]- she is the cold at 5am in the morning and this is when the pain is worse.  -the colder it is the worse it is hurting -she got a year membership with chiropractor and has been there for a month. She has not found benefit yet.  2) left knee pain: -has had steroid injection with benefit.  -this has continued to benefit.   3) left foot pain -prescribed meloxicam  by Dr. Bluford -seeing a doctor today and will told whether she needs surgical pain. She wants whatever is necessary to stop the pain -her arch stays swollen -she was supposed to do PT last month but was unable to get in until 6/6 -still present  4) Anxiety -she gets anxious about driving and she gets palpitations -she has had a prior stroke and has felt this anxiety since this time.   5) migraines -has at baseline but last week was very severe, particularly Wednesday -she considered going to the ED on Wednesday due to her pain  but pain improved without new intervention by Thursday -she saw her PCP this morning and was recommended to take Imitrex  -she does have a history of remote stroke which was thought to be related to her thyroid  disease -she has been doing ok now -her thyroid  levels had gone way out of control.  -she received a shot in the ED for her pain  -pain has been stable -she was hopeful that the more she lost weight her pain would improve  6) Loss of cervical lordosis: -working with chiropractor on improving this and has already seen improvements -joinged a gym  7) morbid obesity -she is put on a strict diet with her endocrinologist.  -lost 22 lbs in a month. She went from 262 to 244.2  today.  -husband is doing the same diet with her and he lost 34 lbs.  -they do portion control sizes and they eat meat once per week -they went vegan with a lot of things, cream cheese and cheese -they have a cheat day once per week.   8) Left foot pain Resolved with ECSWT  9) Achilles tendinosis, right -she is considering surgery -she is scheduled with me for E[facility]  10) Type 2 diabetes -every now and then she will have a cheat day -she thinks her endocrinologist will be upset with her that her HgbA1c improves -she has lost 36 lbs -she has stopped soft drinks -she shows be her improvements in her lipid panel -discussed that se loves greens, hummus -her last bite is often 5:30-6;:30, sometimes 7pm if she is hungry.   11) Right foot pain: -much better after shockwave therapy -continues to be improed  12) Stress: -regarding getting custody of her grandkids as her eldest daughter has a drug addiction  13) Hyperthyroidism: -she would like referral to another endocrinologist -discussed that she has been having chest pain, tachycardia, vision abnormalities, eye twitching  14) Bilateral knee pain: -feels pinch or ache once in a while -has pressure on right knee at work  15) Kidney stone: -she is on flomax  -she was hospitalized -she was treated with fentanyl , toradol  or tramadol , dilaudid  -has been present for 13 days  Pain Inventory Average Pain 7 Pain Right Now 5 My pain is constant, sharp, burning, dull, stabbing, and tingling  In the last 24 hours, has pain interfered with the following? General activity 5 Relation with others 4 Enjoyment of life 8 What TIME of day is your pain at its worst? morning , daytime, evening, and night Sleep (in general) Fair Pain is worse with: walking, bending, sitting, inactivity, standing, and some activites Pain improves with: medication, injections Relief from Meds: 8     Family History  Problem Relation Age of Onset    Hypertension Mother    Thyroid  disease Neg Hx    Social History   Socioeconomic History   Marital status: Married    Spouse name: Not on file   Number of children: Not on file   Years of education: Not on file   Highest education level: Not on file  Occupational History   Occupation: Doesn't work outside the home  Tobacco Use   Smoking status: Former    Current packs/day: 0.00    Average packs/day: 1 pack/day for 26.0 years (26.0 ttl pk-yrs)    Types: Cigarettes    Start date: 01/08/1995    Quit date: 01/07/2021    Years since quitting: 2.7   Smokeless tobacco: Former  Building services engineer status: Every Day  Substances: Nicotine , Flavoring  Substance and Sexual Activity   Alcohol use: Yes    Alcohol/week: 0.0 standard drinks of alcohol    Comment: occasional   Drug use: Not Currently    Types: Marijuana    Comment: occasional   Sexual activity: Not Currently    Birth control/protection: Surgical  Other Topics Concern   Not on file  Social History Narrative   Not on file   Social Drivers of Health   Financial Resource Strain: Not on file  Food Insecurity: Not on file  Transportation Needs: Not on file  Physical Activity: Not on file  Stress: Not on file  Social Connections: Not on file   Past Surgical History:  Procedure Laterality Date   ABDOMINAL EXPOSURE N/A 05/29/2017   Procedure: ABDOMINAL EXPOSURE;  Surgeon: Eliza Lonni RAMAN, MD;  Location: [facility] OR;  Service: Vascular;  Laterality: N/A;   ABDOMINAL HYSTERECTOMY     ANTERIOR LUMBAR FUSION N/A 05/29/2017   Procedure: Anterior Lumbar Interbody Fusion  - Lumbar five sacral one;  Surgeon: Joshua Alm RAMAN, MD;  Location: [facility] OR;  Service: Neurosurgery;  Laterality: N/A;   BIOPSY  02/17/2020   Procedure: BIOPSY;  Surgeon: Cindie Carlin POUR, DO;  Location: [facility];  Service: Endoscopy;;  gastric   CARPAL TUNNEL RELEASE Bilateral    CHOLECYSTECTOMY     CHOLECYSTECTOMY N/A 12/29/2016   Procedure:  LAPAROSCOPIC CHOLECYSTECTOMY;  Surgeon: Kallie Manuelita BROCKS, MD;  Location: [facility] OR[facility];  Service: General;  Laterality: N/A;   COLONOSCOPY WITH PROPOFOL  N/A 02/17/2020   non-bleeding internal hemorrhoids, otherwise normal.   CYSTOSCOPY/URETEROSCOPY/HOLMIUM LASER/STENT PLACEMENT Right 06/25/2019   Procedure: CYSTOSCOPY WITH RIGHT RETROGRADE PYELOGRAM/RIGHT URETEROSCOPY/HOLMIUM LASER APPLICATION RIGHT URETERAL CALCULUS/RIGHT URETERAL STENT PLACEMENT;  Surgeon: Sherrilee Belvie CROME, MD;  Location: [facility];  Service: Urology;  Laterality: Right;   ESOPHAGOGASTRODUODENOSCOPY (EGD) WITH PROPOFOL  N/A 02/17/2020   Procedure: ESOPHAGOGASTRODUODENOSCOPY (EGD) WITH PROPOFOL ;  Surgeon: Cindie Carlin POUR, DO;  Location: [facility];  Service: Endoscopy;  Laterality: N/A;   EXTRACORPOREAL SHOCK WAVE LITHOTRIPSY Right 04/10/2019   Procedure: EXTRACORPOREAL SHOCK WAVE LITHOTRIPSY (ESWL);  Surgeon: Elisabeth Valli BIRCH, MD;  Location: [facility];  Service: Urology;  Laterality: Right;   LAMINECTOMY WITH POSTERIOR LATERAL ARTHRODESIS LEVEL 1 N/A 02/28/2018   Procedure: Laminectomy and Foraminotomy Lumbar Five-Sacral One, posterolateral fusion and fixation;  Surgeon: Joshua Alm RAMAN, MD;  Location: [facility] OR;  Service: Neurosurgery;  Laterality: N/A;  posterior   LUMBAR EPIDURAL INJECTION     STERIOD INJECTION N/A 01/10/2017   Procedure: MINOR EXPAREL  INJECTION;  Surgeon: Kallie Manuelita BROCKS, MD;  Location: [facility];  Service: General;  Laterality: N/A;   TOTAL ABDOMINAL HYSTERECTOMY W/ BILATERAL SALPINGOOPHORECTOMY N/A    TUBAL LIGATION  2003   Past Medical History:  Diagnosis Date   Abnormal Pap smear of cervix    ANXIETY 08/30/2007   Asthma    Chronic back pain    Claustrophobia    pt requests Versed  before surgery.   Complication of anesthesia    hx of waking up during anesthesia   GERD (gastroesophageal reflux disease)    History of kidney stones    Hypertension    HYPOTHYROIDISM, POST-RADIATION  02/08/2009   INSOMNIA 08/30/2007   MIGRAINE HEADACHE 08/30/2007   Neck pain    Palpitations    occasionally couple times a week   Sciatica    Sleep apnea    cpap   Stroke (HCC) 2010   states, no deficits and on no  meds now.   BP 117/76 (BP Location: Left Arm, Patient Position: Sitting, Cuff Size: Large)   Pulse 75   Ht 5' 7 (1.702 m)   Wt 260 lb 9.6 oz (118.2 kg)   LMP 02/25/2016   SpO2 96%   BMI 40.82 kg/m   Opioid Risk Score:   Fall Risk Score:  `1  Depression screen PHQ 2/9     10/02/2023    1:55 PM 08/07/2023    2:10 PM 04/03/2023    2:16 PM 12/05/2022   12:24 PM 10/02/2022    2:55 PM 04/07/2022    1:39 PM 03/21/2022    2:19 PM  Depression screen PHQ 2/9  Decreased Interest 0 0 0 0 0 0 0  Down, Depressed, Hopeless 0 0 0 0 0 0 0  PHQ - 2 Score 0 0 0 0 0 0 0  Altered sleeping      2   Tired, decreased energy      0   Change in appetite      0   Feeling bad or failure about yourself       0   Trouble concentrating      0   Moving slowly or fidgety/restless      0   Suicidal thoughts      0   PHQ-9 Score      2   Difficult doing work/chores      Somewhat difficult     Review of Systems  Constitutional:  Negative for unexpected weight change.  HENT: Negative.    Eyes: Negative.   Respiratory: Negative.    Cardiovascular:  Negative for leg swelling.  Gastrointestinal: Negative.   Endocrine: Negative.   Genitourinary: Negative.   Musculoskeletal:  Positive for arthralgias and back pain.       Pain in both legs  Skin: Negative.   Allergic/Immunologic: Negative.   Neurological:  Negative for numbness.       Tingling  Hematological: Negative.   Psychiatric/Behavioral:  The patient is nervous/anxious.   All other systems reviewed and are negative.      Objective:   Physical Exam Gen: no distress, normal appearing, weight 260 lbs, BMI 40.82, 134/84 HEENT: oral mucosa pink and moist, NCAT, exopthalmos Gen: no distress, normal appearing HEENT: oral mucosa pink  and moist, NCAT Cardio: Reg rate Gen: no distress, normal appearing HEENT: oral mucosa pink and moist, NCAT Cardio: Reg rate Chest: normal effort, normal rate of breathing Abd: soft, non-distended Ext: no edema Psych: pleasant, normal affect Musculoskeletal: Normal ambulation, right achiles tendinopathy, left heel spur, negative slump test, left sided lower extremity weakness, stable 7/15    Assessment & Plan:  Teresa Vincent is a 43 year old woman who presents to establish care for lumbar spinal stenosis/neuropathy  1) Lumbar spinal stenosis with lumbar neurogenic claudication.  -Discussed benefits of exercise in reducing pain. -UDS ordered  -discussed that Journavx is a highly selective inhibitor for Nav 1.8, which is specific for pain in the peripheral nervous system, discussed that lidocaine  in contrast affects all Nav receptors, discussed that patient can try using this medication as prn for pain, though its studies have focused on its use for acute pain. Discussed that it has been studied against opioids for acute pain with comparable efficacy. Discussed that I have not seen any patient side effects thus far. Discussed that we have samples available and we have copay cards available. Discussed that outpatient if the medication requires a prior auth the copay  should be $30 for at least a 60 day supply. The medication may be more likely to be in stock in [facility] and [facility]. We do have samples available.   -discussed that gabapentin  negatively impacted her memory and did not help her pain, discussed that she was taking 900mg  QID for 1 year -discussed that her spinal surgeon does not feel that she needs surgery at this time -discussed that she had a steroid injection and this provided 14 days of relief -discussed that lidocaine  patch was not helpful -refilled Percocet -discussed disc height loss on recent XR -continue D3  -discussed that she is following with Dr. Joshua -failed lidocaine   patches -discussed avoiding topamax  given that this can cause hyperthyroidism -continue light restriction on bending and twisting -discussed that pain has acutely worsened -discussed that she got an XR and an MRI of her lumbar spine -Discussed current symptoms of pain and history of pain.  -Discussed benefits of exercise in reducing pain. -Discussed following foods that may reduce pain: 1) Ginger (especially studied for arthritis)- reduce leukotriene production to decrease inflammation 2) Blueberries- high in phytonutrients that decrease inflammation 3) Salmon- marine omega-3s reduce joint swelling and pain 4) Pumpkin seeds- reduce inflammation 5) dark chocolate- reduces inflammation 6) turmeric- reduces inflammation 7) tart cherries - reduce pain and stiffness 8) extra virgin olive oil - its compound olecanthal helps to block prostaglandins  9) chili peppers- can be eaten or applied topically via capsaicin 10) mint- helpful for headache, muscle aches, joint pain, and itching 11) garlic- reduces inflammation  Link to further information on diet for chronic pain: [email or web address].bray.[email or web address]/   -discussed that her stress has not worsen her pain Continue heat therapy -discussed negative response to gabapentin  in the past.  Prescribing Home Zynex NexWave Stimulator Device and supplies as needed. IFC, NMES and TENS medically necessary Treatment Rx: Daily @ 30-40 minutes per treatment PRN. Zynex NexWave only, no substitutions. Treatment Goals: 1) To reduce and/or eliminate pain 2) To improve functional capacity and Activities of daily living 3) To reduce or prevent the need for oral medications 4) To improve circulation in the injured region 5) To decrease or prevent muscle spasm and muscle atrophy 6) To provide a self-management tool to the patient The patient has not sufficiently improved with conservative care. Numerous studies  indexed by Medline and PubMed.gov have shown Neuromuscular, Interferential, and TENS stimulators to reduce pain, improve function, and reduce medication use in injured patients. Continued use of this evidence based, safe, drug free treatment is both reasonable and medically necessary at this time.    Continue UDS monitoring -Discussed current symptoms -She is allergic to bupivicaine, she has tried surgery, injections, medication. Oxycodone  5mg  helped. She has never failed a drug screen. She works at [employer]. Increase oxycodone  to 5mg  daily.  -Will obtain UDS and pain contract today. If results negative, can prescribe Percocet 5mg -325 monthly PRN.  Lumbar MRI reviewed as follows:  IMPRESSION: 1. No impingement identified in the lumbar spine. 2. 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 3. Degenerative disc disease at T11-12, L2- 3, L3-4, and L5-S1. 4. Cholelithiasis.   2) Obesity: weight 265 lbs, BMI 40.10 -Educated regarding health benefits of weight loss- for pain, general health, chronic disease prevention, immune health, mental health.  -continue black coffee -Educated regarding health benefits of weight loss- for pain, general health, chronic disease prevention, immune health, mental health.  -Will monitor weight every visit.  -Consider Roobois tea daily.  -Discussed the benefits  of intermittent fasting. -Discussed foods that can assist in weight loss: 1) leafy greens- high in fiber and nutrients 2) dark chocolate- improves metabolism (if prefer sweetened, best to sweeten with honey instead of sugar).  3) cruciferous vegetables- high in fiber and protein 4) full fat yogurt: high in healthy fat, protein, calcium , and probiotics 5) apples- high in a variety of phytochemicals 6) nuts- high in fiber and protein that increase feelings of fullness 7) grapefruit: rich in nutrients, antioxidants, and fiber (not to be taken with anticoagulation) 8) beans- high in  protein and fiber 9) salmon- has high quality protein and healthy fats 10) green tea- rich in polyphenols 11) eggs- rich in choline and vitamin D  12) tuna- high protein, boosts metabolism 13) avocado- decreases visceral abdominal fat 14) chicken (pasture raised): high in protein and iron 15) blueberries- reduce abdominal fat and cholesterol 16) whole grains- decreases calories retained during digestion, speeds metabolism 17) chia seeds- curb appetite 18) chilies- increases fat metabolism  -Discussed supplements that can be used:  1) Metatrim 400mg  BID 30 minutes before breakfast and dinner  2) Sphaeranthus indicus and Garcinia mangostana (combinations of these and #1 can be found in capsicum and zychrome  3) green coffee bean extract 400mg  twice per day or Irvingia (african mango) 150 to 300mg  twice per day.   3) migraines -agree with PCP that imitrex  is a great option. Discussed with her that there is increased risk of stroke. She states thyroid  function is currently well controlled -discussed any potential triggers- she notes new perfume last week which could have been a trigger -advised to continue to be attentive for other food or chemical related triggers   4) HTN: -Advised checking BP daily at home and logging results to bring into follow-up appointment with PCP and myself. -Reviewed BP meds today.  -Advised regarding healthy foods that can help lower blood pressure and provided with a list: 1) citrus foods- high in vitamins and minerals 2) salmon and other fatty fish - reduces inflammation and oxylipins 3) swiss chard (leafy green)- high level of nitrates 4) pumpkin seeds- one of the best natural sources of magnesium  5) Beans and lentils- high in fiber, magnesium , and potassium 6) Berries- high in flavonoids 7) Amaranth (whole grain, can be cooked similarly to rice and oats)- high in magnesium  and fiber 8) Pistachios- even more effective at reducing BP than other nuts 9)  Carrots- high in phenolic compounds that relax blood vessels and reduce inflammation 10) Celery- contain phthalides that relax tissues of arterial walls 11) Tomatoes- can also improve cholesterol and reduce risk of heart disease 12) Broccoli- good source of magnesium , calcium , and potassium 13) Greek yogurt: high in potassium and calcium  14) Herbs and spices: Celery seed, cilantro, saffron, lemongrass, black cumin, ginseng, cinnamon, cardamom, sweet basil, and ginger 15) Chia and flax seeds- also help to lower cholesterol and blood sugar 16) Beets- high levels of nitrates that relax blood vessels  17) spinach and bananas- high in potassium  -Provided lise of supplements that can help with hypertension:  1) magnesium : one high quality brand is Bioptemizers since it contains all 7 types of magnesium , otherwise over the counter magnesium  gluconate 400mg  is a good option 2) B vitamins 3) vitamin D  4) potassium 5) CoQ10 6) L-arginine 7) Vitamin C 8) Beetroot -Educated that goal BP is 120/80. -Made goal to incorporate some of the above foods into diet.    5) Left heel plantar fascitis: Discussed resolution with ECSWT  6) Right Achiles  tendinopathy: -discussed resolution with ECSWT -discussed continued improvement  7) Type 2 diabetes: -Discussed the benefits of intermittent fasting. Recommended starting with pushing dinner 15 minutes earlier and when this feels easy, continuing to push dinner 15 minutes earlier. Discussed that this can help her body to improve its ability to burn fat rather than glucose, improving insulin sensitivity. Recommended drinking Roobois tea in the evening to help curb appetite and for its numerous health benefits.   -discussed most recent HgbA1c of 6.2. -discussed that she is doing an incredible job with her intermittent fasting -commended eating smaller dinners.  -continue steamed vegetables -continue protein shake -discussed the importance of fiber.   8)  Nausea: refilled zofran   9) Stress: Discussed stress from obtaining the custody of her grandkids due to her daughter having issues with drug abuse  10) Sleep apnea: continue CPAP  11) Hyperthyroidism 2/2 Grave's disease: -discussed that recent thyroid  labs have worsened and her symptoms have greatly worsened -referred to endocrinology, discussed that she thinks her endocrinologist said eating greens would help her hyperthyroidism  12) Bilateral knee pain: -discussed that she feel work worsens this pain  13) Obesity: -recommended Paris Harvest MD -discussed pros and cons of GLP-1s -discussed compounded versus injectibles  -discussed that cruciferous vegetables are a great natural source -BMI reviewed and is 40.82 on 7/15 -weight reviewed and is 260 lbs  14) Kidney stone: -continue flomax  -continue ketorolac  prn -continue oxycodone  prn -discussed plan for US  tomorrow -discussed that she has had lithotripsy in the past -discussed that working hurt too much -discussed she was throwing up from pain, and this pain is worse than natural childbirth was for her -discussed that we can help her with short term disability paperwork if she needs -continue lemon water  -continue following with urologist

## 2023-10-02 NOTE — Patient Instructions (Signed)
 NAC N-acetyl cysteine 500mg  BID

## 2023-10-03 ENCOUNTER — Ambulatory Visit (HOSPITAL_COMMUNITY)
Admission: RE | Admit: 2023-10-03 | Discharge: 2023-10-03 | Disposition: A | Discharge status: Home / Self Care | Source: Ambulatory Visit | Attending: Urology | Admitting: Urology

## 2023-10-03 DIAGNOSIS — N200 Calculus of kidney: Secondary | ICD-10-CM | POA: Diagnosis not present

## 2023-10-03 DIAGNOSIS — N201 Calculus of ureter: Secondary | ICD-10-CM | POA: Insufficient documentation

## 2023-10-03 DIAGNOSIS — R109 Unspecified abdominal pain: Secondary | ICD-10-CM | POA: Insufficient documentation

## 2023-10-03 DIAGNOSIS — N281 Cyst of kidney, acquired: Secondary | ICD-10-CM | POA: Diagnosis not present

## 2023-10-03 DIAGNOSIS — Z981 Arthrodesis status: Secondary | ICD-10-CM | POA: Diagnosis not present

## 2023-10-03 DIAGNOSIS — N132 Hydronephrosis with renal and ureteral calculous obstruction: Secondary | ICD-10-CM | POA: Diagnosis not present

## 2023-10-03 DIAGNOSIS — N1330 Unspecified hydronephrosis: Secondary | ICD-10-CM | POA: Diagnosis not present

## 2023-10-08 ENCOUNTER — Other Ambulatory Visit: Payer: Self-pay | Admitting: Urology

## 2023-10-08 ENCOUNTER — Telehealth: Payer: Self-pay

## 2023-10-08 DIAGNOSIS — N201 Calculus of ureter: Secondary | ICD-10-CM

## 2023-10-08 DIAGNOSIS — M5416 Radiculopathy, lumbar region: Secondary | ICD-10-CM | POA: Diagnosis not present

## 2023-10-08 DIAGNOSIS — N1330 Unspecified hydronephrosis: Secondary | ICD-10-CM

## 2023-10-08 DIAGNOSIS — N200 Calculus of kidney: Secondary | ICD-10-CM

## 2023-10-08 DIAGNOSIS — R109 Unspecified abdominal pain: Secondary | ICD-10-CM

## 2023-10-08 MED ORDER — TAMSULOSIN HCL 0.4 MG PO CAPS: 0.4000 mg | ORAL_CAPSULE | Freq: Every day | ORAL | 0 refills | Status: DC

## 2023-10-08 NOTE — Telephone Encounter (Signed)
 Pt would like to know the time frame that is listed on your FMLA paperwork. How long she would be out work? Pt state's she has been out of work since 10/02/2023 and if the provider can back date it for that date so her employer can compensated her.

## 2023-10-10 NOTE — Telephone Encounter (Signed)
 Patient is aware MD will give intermittent leave for one 8 hour shift a week for 3 months.  She requested this be back dated to 07/15 when she had to leave work.  Ok per MD.

## 2023-10-15 ENCOUNTER — Telehealth: Payer: Self-pay

## 2023-10-15 ENCOUNTER — Telehealth: Payer: Self-pay | Admitting: *Deleted

## 2023-10-15 NOTE — Telephone Encounter (Signed)
 Pt called to ask if we faxed over FMLA paper work and if we received a second form of FMLA for intermittent leave pt advised the original FMLA paper work has been faxed over and if she needed anything for intermittent FMLA additional documentation is need we have not received them pt voiced her understanding

## 2023-10-15 NOTE — Telephone Encounter (Unsigned)
 Copied from CRM [id number]. Topic: Clinical - Medical Advice >> Oct 15, 2023  9:54 AM Teresa Vincent wrote: Reason for CRM: The patient has called to request an order for a new mask for their CPAP machine. The patient would like to know if the order can be submitted to [facility] in [place]. Please contact the patient further if needed

## 2023-10-15 NOTE — Telephone Encounter (Signed)
 Patient left FMLA paperwork and $20.00 for cost of paperwork to be completed.

## 2023-10-17 ENCOUNTER — Other Ambulatory Visit: Payer: Self-pay | Admitting: Nurse Practitioner

## 2023-10-17 NOTE — Telephone Encounter (Signed)
 Mauro Elveria BROCKS, NP      10/17/23  9:51 AM I will try to reorder her CPAP but she may need HBST. Her last one was done in the sleep lab in 2019. The orders ask if she is using her CPAP now. If not, why? Thanks.

## 2023-10-19 DIAGNOSIS — E890 Postprocedural hypothyroidism: Secondary | ICD-10-CM | POA: Diagnosis not present

## 2023-10-19 DIAGNOSIS — R7309 Other abnormal glucose: Secondary | ICD-10-CM | POA: Diagnosis not present

## 2023-10-22 NOTE — Telephone Encounter (Signed)
 Patient states she passed her stone.  She will follow up tomorrow as scheduled with a KUB.  She is requesting to return to work on 10/24/2023.  She is aware [facility] will determine this at her appt and we will fax back paperwork to [facility].

## 2023-10-23 ENCOUNTER — Ambulatory Visit: Admitting: Urology

## 2023-10-23 ENCOUNTER — Ambulatory Visit (HOSPITAL_COMMUNITY)
Admission: RE | Admit: 2023-10-23 | Discharge: 2023-10-23 | Disposition: A | Discharge status: Home / Self Care | Source: Ambulatory Visit | Attending: Urology | Admitting: Urology

## 2023-10-23 DIAGNOSIS — R109 Unspecified abdominal pain: Secondary | ICD-10-CM | POA: Diagnosis not present

## 2023-10-23 DIAGNOSIS — N1330 Unspecified hydronephrosis: Secondary | ICD-10-CM | POA: Diagnosis not present

## 2023-10-23 DIAGNOSIS — N201 Calculus of ureter: Secondary | ICD-10-CM | POA: Diagnosis not present

## 2023-10-23 DIAGNOSIS — N200 Calculus of kidney: Secondary | ICD-10-CM | POA: Insufficient documentation

## 2023-10-24 ENCOUNTER — Ambulatory Visit: Admitting: Urology

## 2023-10-25 ENCOUNTER — Ambulatory Visit: Payer: Self-pay | Admitting: Urology

## 2023-10-26 ENCOUNTER — Encounter: Payer: Self-pay | Admitting: Physical Medicine and Rehabilitation

## 2023-10-30 ENCOUNTER — Telehealth: Payer: Self-pay

## 2023-10-30 NOTE — Telephone Encounter (Signed)
-----   Message from Nurse [facility] sent at 10/30/2023 10:37 AM EDT ----- rick

## 2023-10-30 NOTE — Telephone Encounter (Signed)
 I spoke with patient, she is aware paper work was filled out with the return to work date that we had discussed.  Patient had passed her stone, she was supposed to follow up with our NP for confirmation, patient's appt had to be moved out due to scheduling conflict.  I informed patient to follow up with our office if there is additional paper work her employer is requesting and that I would refax the last forms again.

## 2023-11-05 ENCOUNTER — Telehealth: Payer: Self-pay

## 2023-11-05 NOTE — Telephone Encounter (Signed)
 Patient called to follow up on FMLA paper work that was faxed over, she states per [employer] they do not that the forms that were faxed back to them.  I tried to reach [employer] to confirm fax number and was unable to get through because the number provided was for employees and required an employee ID.  I had re faxed forms last week with confirmation each time and per patient they still have not received forms.  Patient could not provide an alternative fax number or phone number for [employer].  She states they requested the forms be emailed to [email or web address].

## 2023-11-07 DIAGNOSIS — G4733 Obstructive sleep apnea (adult) (pediatric): Secondary | ICD-10-CM | POA: Diagnosis not present

## 2023-11-20 NOTE — Progress Notes (Signed)
 In error

## 2023-11-22 DIAGNOSIS — U071 COVID-19: Secondary | ICD-10-CM | POA: Diagnosis not present

## 2023-11-22 DIAGNOSIS — J029 Acute pharyngitis, unspecified: Secondary | ICD-10-CM | POA: Diagnosis not present

## 2023-11-22 DIAGNOSIS — R030 Elevated blood-pressure reading, without diagnosis of hypertension: Secondary | ICD-10-CM | POA: Diagnosis not present

## 2023-11-22 DIAGNOSIS — R509 Fever, unspecified: Secondary | ICD-10-CM | POA: Diagnosis not present

## 2023-11-24 ENCOUNTER — Encounter: Payer: Self-pay | Admitting: Physical Medicine and Rehabilitation

## 2023-11-26 ENCOUNTER — Encounter: Payer: Self-pay | Admitting: Physical Medicine and Rehabilitation

## 2023-11-26 ENCOUNTER — Encounter: Attending: Physical Medicine and Rehabilitation | Admitting: Physical Medicine and Rehabilitation

## 2023-11-26 VITALS — BP 117/74 | HR 60 | Ht 67.0 in | Wt 260.2 lb

## 2023-11-26 DIAGNOSIS — M48062 Spinal stenosis, lumbar region with neurogenic claudication: Secondary | ICD-10-CM | POA: Insufficient documentation

## 2023-11-26 DIAGNOSIS — E0590 Thyrotoxicosis, unspecified without thyrotoxic crisis or storm: Secondary | ICD-10-CM | POA: Diagnosis not present

## 2023-11-26 MED ORDER — OXYCODONE-ACETAMINOPHEN 5-325 MG PO TABS: 1.0000 | ORAL_TABLET | Freq: Two times a day (BID) | ORAL | 0 refills | Status: DC | PRN

## 2023-11-26 NOTE — Progress Notes (Signed)
 Subjective:    Patient ID: Teresa Vincent, female    DOB: [birth year], 43 y.o.   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  -she contacted her spinal physician again and was told she can only have the injection every three months -she was advised to do dry needling once per week for 6 weeks and this does not provide much benefit -tens unit helps temporarily -she has been feeling intermittent sensations for a few months; -feels like a tingling, burning, cooling sensation in lumbar area -has been getting worse she thinks because she has been standing -she thinks something is pinched or a screw was dislodged -she has been super busy at work- has not had a day of since August 15th since she picked up a 2nd job -she needs to go see her mom tomorrow -she was also doing soup kitchen with her church -this is her first weekend and she plans on relaxing -has a tens unit and it does help but if the pain is through the roof then it doesn't help -she tries to use the tens three times per day [name] is a 43 year old woman who presents to establish care for left sided lower back pain that radiates into her left leg.  -she felt no benefit from going to the chiropractor.  -pain has been severe recently -twice a month she is working 7 days a week and she talked with her boss's boss about slowing down a little and he is seeing if this possible.  -she takes care of the garden center at [employer] -Pain has been present for years -She has had two back surgeries.  -She has had 7 injections but got no relief from these.  -She has gone back to Dr. Joshua who did her surgery.  -She was on oxycodone  5mg  that she broke in half to 2.5s which helped. This helped her to get through the day.  She has been on it for years. She has been following with Fidela.  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  -feels better when laying down, but also painful while sitting -today has been a bad day -she uses her electric blanket and heating pad -she has been working at [employer]- she is the cold at 5am in the morning and this is when the pain is worse.  -the colder it is the worse it is hurting -she got a year membership with chiropractor and has been there for a month. She has not found benefit yet.  2) left knee pain: -has had steroid injection with benefit.  -this has continued to benefit.   3) left foot pain -prescribed meloxicam  by Dr. Bluford -seeing a doctor today and will told whether she needs surgical pain. She wants whatever is necessary to stop the pain -her arch stays swollen -she was supposed to do PT last month but was unable to get in until 6/6 -still present  4) Anxiety -she gets anxious about driving and she gets palpitations -she has had a prior stroke and has felt this anxiety since this time.   5) migraines -has at baseline but last week was very severe, particularly Wednesday -she considered going to  the ED on Wednesday due to her pain but pain improved without new intervention by Thursday -she saw her PCP this morning and was recommended to take Imitrex  -she does have a history of remote stroke which was thought to be related to her thyroid  disease -she has been doing ok now -her thyroid  levels had gone way out of control.  -she received a shot in the ED for her pain  -pain has been stable -she was hopeful that the more she lost weight her pain would improve  6) Loss of cervical lordosis: -working with chiropractor on improving this and has already seen improvements -joinged a gym  7) morbid obesity -she is put on a strict diet with her endocrinologist.  -lost 22  lbs in a month. She went from 262 to 244.2 today.  -husband is doing the same diet with her and he lost 34 lbs.  -they do portion control sizes and they eat meat once per week -they went vegan with a lot of things, cream cheese and cheese -they have a cheat day once per week.   8) Left foot pain Resolved with ECSWT  9) Achilles tendinosis, right -she is considering surgery -she is scheduled with me for ECSWT  10) Type 2 diabetes -every now and then she will have a cheat day -she thinks her endocrinologist will be upset with her that her HgbA1c improves -she has lost 36 lbs -she has stopped soft drinks -she shows be her improvements in her lipid panel -discussed that se loves greens, hummus -her last bite is often 5:30-6;:30, sometimes 7pm if she is hungry.   11) Right foot pain: -much better after shockwave therapy -continues to be improed  12) Stress: -regarding getting custody of her grandkids as her eldest daughter has a drug addiction  13) Hyperthyroidism: -she would like referral to another endocrinologist -discussed that she has been having chest pain, tachycardia, vision abnormalities, eye twitching  14) Bilateral knee pain: -feels pinch or ache once in a while -has pressure on right knee at work  15) Kidney stone: -she is on flomax  -she was hospitalized -she was treated with fentanyl , toradol  or tramadol , dilaudid  -has been present for 13 days  16) Fatigue: -she found benefit from the B12 -she thinks it is her thyroid  that is making her tired  Pain Inventory Average Pain 8 Pain Right Now 4 My pain is constant, sharp, burning, dull, stabbing, and tingling  In the last 24 hours, has pain interfered with the following? General activity 8 Relation with others 8 Enjoyment of life 8 What TIME of day is your pain at its worst? morning , daytime, evening, and night Sleep (in general) Fair Pain is worse with: walking, bending, sitting, inactivity, standing,  and some activites Pain improves with: medication, injections Relief from Meds: 8     Family History  Problem Relation Age of Onset   Hypertension Mother    Thyroid  disease Neg Hx    Social History   Socioeconomic History   Marital status: Married    Spouse name: Not on file   Number of children: Not on file   Years of education: Not on file   Highest education level: Not on file  Occupational History   Occupation: Doesn't work outside the home  Tobacco Use   Smoking status: Former    Current packs/day: 0.00    Average packs/day: 1 pack/day for 26.0 years (26.0 ttl pk-yrs)    Types: Cigarettes    Start date: 01/08/1995  Quit date: 01/07/2021    Years since quitting: 2.8   Smokeless tobacco: Former  Building services engineer status: Every Day   Substances: Nicotine , Flavoring  Substance and Sexual Activity   Alcohol use: Yes    Alcohol/week: 0.0 standard drinks of alcohol    Comment: occasional   Drug use: Not Currently    Types: Marijuana    Comment: occasional   Sexual activity: Not Currently    Birth control/protection: Surgical  Other Topics Concern   Not on file  Social History Narrative   Not on file   Social Drivers of Health   Financial Resource Strain: Not on file  Food Insecurity: Not on file  Transportation Needs: Not on file  Physical Activity: Not on file  Stress: Not on file  Social Connections: Not on file   Past Surgical History:  Procedure Laterality Date   ABDOMINAL EXPOSURE N/A 05/29/2017   Procedure: ABDOMINAL EXPOSURE;  Surgeon: Eliza Lonni RAMAN, MD;  Location: [facility] OR;  Service: Vascular;  Laterality: N/A;   ABDOMINAL HYSTERECTOMY     ANTERIOR LUMBAR FUSION N/A 05/29/2017   Procedure: Anterior Lumbar Interbody Fusion  - Lumbar five sacral one;  Surgeon: Joshua Alm RAMAN, MD;  Location: [facility] OR;  Service: Neurosurgery;  Laterality: N/A;   BIOPSY  02/17/2020   Procedure: BIOPSY;  Surgeon: Cindie Carlin POUR, DO;  Location: [facility];   Service: Endoscopy;;  gastric   CARPAL TUNNEL RELEASE Bilateral    CHOLECYSTECTOMY     CHOLECYSTECTOMY N/A 12/29/2016   Procedure: LAPAROSCOPIC CHOLECYSTECTOMY;  Surgeon: Kallie Manuelita BROCKS, MD;  Location: [facility] ORS;  Service: General;  Laterality: N/A;   COLONOSCOPY WITH PROPOFOL  N/A 02/17/2020   non-bleeding internal hemorrhoids, otherwise normal.   CYSTOSCOPY/URETEROSCOPY/HOLMIUM LASER/STENT PLACEMENT Right 06/25/2019   Procedure: CYSTOSCOPY WITH RIGHT RETROGRADE PYELOGRAM/RIGHT URETEROSCOPY/HOLMIUM LASER APPLICATION RIGHT URETERAL CALCULUS/RIGHT URETERAL STENT PLACEMENT;  Surgeon: Sherrilee Belvie CROME, MD;  Location: [facility];  Service: Urology;  Laterality: Right;   ESOPHAGOGASTRODUODENOSCOPY (EGD) WITH PROPOFOL  N/A 02/17/2020   Procedure: ESOPHAGOGASTRODUODENOSCOPY (EGD) WITH PROPOFOL ;  Surgeon: Cindie Carlin POUR, DO;  Location: [facility];  Service: Endoscopy;  Laterality: N/A;   EXTRACORPOREAL SHOCK WAVE LITHOTRIPSY Right 04/10/2019   Procedure: EXTRACORPOREAL SHOCK WAVE LITHOTRIPSY (ESWL);  Surgeon: Elisabeth Valli BIRCH, MD;  Location: [facility];  Service: Urology;  Laterality: Right;   LAMINECTOMY WITH POSTERIOR LATERAL ARTHRODESIS LEVEL 1 N/A 02/28/2018   Procedure: Laminectomy and Foraminotomy Lumbar Five-Sacral One, posterolateral fusion and fixation;  Surgeon: Joshua Alm RAMAN, MD;  Location: [facility] OR;  Service: Neurosurgery;  Laterality: N/A;  posterior   LUMBAR EPIDURAL INJECTION     STERIOD INJECTION N/A 01/10/2017   Procedure: MINOR EXPAREL  INJECTION;  Surgeon: Kallie Manuelita BROCKS, MD;  Location: [facility];  Service: General;  Laterality: N/A;   TOTAL ABDOMINAL HYSTERECTOMY W/ BILATERAL SALPINGOOPHORECTOMY N/A    TUBAL LIGATION  2003   Past Medical History:  Diagnosis Date   Abnormal Pap smear of cervix    ANXIETY 08/30/2007   Asthma    Chronic back pain    Claustrophobia    pt requests Versed  before surgery.   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   BP 117/74   Pulse 60   Ht 5' 7 (1.702 m)   Wt 260 lb 3.2 oz (118 kg)   LMP 02/25/2016   SpO2 100%   BMI 40.75 kg/m   Opioid Risk Score:   Fall Risk Score:  `1  Depression screen PHQ 2/9     10/02/2023    1:55 PM 08/07/2023    2:10 PM 04/03/2023    2:16 PM 12/05/2022   12:24 PM 10/02/2022    2:55 PM 04/07/2022    1:39 PM 03/21/2022    2:19 PM  Depression screen PHQ 2/9  Decreased Interest 0 0 0 0 0 0 0  Down, Depressed, Hopeless 0 0 0 0 0 0 0  PHQ - 2 Score 0 0 0 0 0 0 0  Altered sleeping      2   Tired, decreased energy      0   Change in appetite      0   Feeling bad or failure about yourself       0   Trouble concentrating      0   Moving slowly or fidgety/restless      0   Suicidal thoughts      0   PHQ-9 Score      2   Difficult doing work/chores      Somewhat difficult     Review of Systems  Constitutional:  Negative for unexpected weight change.  HENT: Negative.    Eyes: Negative.   Respiratory: Negative.    Cardiovascular:  Negative for leg swelling.  Gastrointestinal: Negative.   Endocrine: Negative.   Genitourinary: Negative.   Musculoskeletal:  Positive for arthralgias and back pain.       Pain in both legs  Skin: Negative.   Allergic/Immunologic: Negative.   Neurological:  Negative for numbness.       Tingling  Hematological: Negative.   Psychiatric/Behavioral:  The patient is nervous/anxious.   All other systems reviewed and are negative.      Objective:   Physical Exam Gen: no distress, normal appearing, weight 260 lbs, BMI 40.82, 134/84 HEENT: oral mucosa pink and moist, NCAT,  exopthalmos Gen: no distress, normal appearing HEENT: oral mucosa pink and moist, NCAT Cardio: Reg rate Gen: no distress, normal appearing HEENT: oral mucosa pink and moist, NCAT Cardio: Reg rate Chest: normal effort, normal rate of breathing Abd: soft, non-distended Ext: no edema Psych: pleasant, normal affect Musculoskeletal: Normal ambulation, right achiles tendinopathy, left heel spur, negative slump test, left sided lower extremity weakness, stable 7/15    Assessment & Plan:  Mrs. Frieze is a 43 year old woman who presents to establish care for lumbar spinal stenosis/neuropathy  1) Lumbar spinal stenosis with lumbar neurogenic claudication.  -Discussed benefits of exercise in reducing pain. -UDS ordered -refilled percocet  -discussed that Journavx is a highly selective inhibitor for Nav 1.8, which is specific for pain in the peripheral nervous system, discussed that lidocaine  in contrast affects all Nav receptors, discussed that patient can try using this medication as prn for pain, though its studies have focused on its use for acute pain. Discussed that it has been studied against opioids for acute pain with comparable efficacy. Discussed that I have not seen any patient side effects thus far. Discussed that we have samples available  and we have copay cards available. Discussed that outpatient if the medication requires a prior auth the copay should be $30 for at least a 60 day supply. The medication may be more likely to be in stock in [facility] and [facility]. We do have samples available.   -discussed that gabapentin  negatively impacted her memory and did not help her pain, discussed that she was taking 900mg  QID for 1 year -discussed that her spinal surgeon does not feel that she needs surgery at this time -discussed that she had a steroid injection and this provided 14 days of relief -discussed that lidocaine  patch was not helpful -refilled Percocet -discussed disc height loss on  recent XR -continue D3  -discussed that she is following with Dr. Joshua -failed lidocaine  patches -discussed avoiding topamax  given that this can cause hyperthyroidism -continue light restriction on bending and twisting -discussed that pain has acutely worsened -discussed that she got an XR and an MRI of her lumbar spine -Discussed current symptoms of pain and history of pain.  -Discussed benefits of exercise in reducing pain. -Discussed following foods that may reduce pain: 1) Ginger (especially studied for arthritis)- reduce leukotriene production to decrease inflammation 2) Blueberries- high in phytonutrients that decrease inflammation 3) Salmon- marine omega-3s reduce joint swelling and pain 4) Pumpkin seeds- reduce inflammation 5) dark chocolate- reduces inflammation 6) turmeric- reduces inflammation 7) tart cherries - reduce pain and stiffness 8) extra virgin olive oil - its compound olecanthal helps to block prostaglandins  9) chili peppers- can be eaten or applied topically via capsaicin 10) mint- helpful for headache, muscle aches, joint pain, and itching 11) garlic- reduces inflammation  Link to further information on diet for chronic pain: [email or web address]   -discussed that her stress has not worsen her pain Continue heat therapy -discussed negative response to gabapentin  in the past.  Prescribing Home Zynex NexWave Stimulator Device and supplies as needed. IFC, NMES and TENS medically necessary Treatment Rx: Daily @ 30-40 minutes per treatment PRN. Zynex NexWave only, no substitutions. Treatment Goals: 1) To reduce and/or eliminate pain 2) To improve functional capacity and Activities of daily living 3) To reduce or prevent the need for oral medications 4) To improve circulation in the injured region 5) To decrease or prevent muscle spasm and muscle atrophy 6) To provide a self-management tool to the  patient The patient has not sufficiently improved with conservative care. Numerous studies indexed by Medline and PubMed.gov have shown Neuromuscular, Interferential, and TENS stimulators to reduce pain, improve function, and reduce medication use in injured patients. Continued use of this evidence based, safe, drug free treatment is both reasonable and medically necessary at this time.    Continue UDS monitoring -Discussed current symptoms -She is allergic to bupivicaine, she has tried surgery, injections, medication. Oxycodone  5mg  helped. She has never failed a drug screen. She works at [employer]. Increase oxycodone  to 5mg  daily.  -Will obtain UDS and pain contract today. If results negative, can prescribe Percocet 5mg -325 monthly PRN.  Lumbar MRI reviewed as follows:  IMPRESSION: 1. No impingement identified in the lumbar spine. 2. 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 3. Degenerative disc disease at T11-12, L2- 3, L3-4, and L5-S1. 4. Cholelithiasis.   2) Obesity: weight 260 lbs, BMI 40.75 -discussed risks and benefits from GLP-1s, discussed Rybelsius, discussed that she can get this prescribed by her PCP or [facility] MD, discussed risks of nausea, malnutrition, discussed her husband's experience with Zepbound, discussed that when she  eats healthier she is able to lose more weight, discussed that she is eating more cold vegetables -Educated regarding health benefits of weight loss- for pain, general health, chronic disease prevention, immune health, mental health.  -continue black coffee -Educated regarding health benefits of weight loss- for pain, general health, chronic disease prevention, immune health, mental health.  -Will monitor weight every visit.  -Consider Roobois tea daily.  -Discussed the benefits of intermittent fasting. -Discussed foods that can assist in weight loss: 1) leafy greens- high in fiber and nutrients 2) dark chocolate- improves  metabolism (if prefer sweetened, best to sweeten with honey instead of sugar).  3) cruciferous vegetables- high in fiber and protein 4) full fat yogurt: high in healthy fat, protein, calcium , and probiotics 5) apples- high in a variety of phytochemicals 6) nuts- high in fiber and protein that increase feelings of fullness 7) grapefruit: rich in nutrients, antioxidants, and fiber (not to be taken with anticoagulation) 8) beans- high in protein and fiber 9) salmon- has high quality protein and healthy fats 10) green tea- rich in polyphenols 11) eggs- rich in choline and vitamin D  12) tuna- high protein, boosts metabolism 13) avocado- decreases visceral abdominal fat 14) chicken (pasture raised): high in protein and iron 15) blueberries- reduce abdominal fat and cholesterol 16) whole grains- decreases calories retained during digestion, speeds metabolism 17) chia seeds- curb appetite 18) chilies- increases fat metabolism  -Discussed supplements that can be used:  1) Metatrim 400mg  BID 30 minutes before breakfast and dinner  2) Sphaeranthus indicus and Garcinia mangostana (combinations of these and #1 can be found in capsicum and zychrome  3) green coffee bean extract 400mg  twice per day or Irvingia (african mango) 150 to 300mg  twice per day.   3) migraines -agree with PCP that imitrex  is a great option. Discussed with her that there is increased risk of stroke. She states thyroid  function is currently well controlled -discussed any potential triggers- she notes new perfume last week which could have been a trigger -advised to continue to be attentive for other food or chemical related triggers   4) HTN: -Advised checking BP daily at home and logging results to bring into follow-up appointment with PCP and myself. -Reviewed BP meds today.  -Advised regarding healthy foods that can help lower blood pressure and provided with a list: 1) citrus foods- high in vitamins and minerals 2)  salmon and other fatty fish - reduces inflammation and oxylipins 3) swiss chard (leafy green)- high level of nitrates 4) pumpkin seeds- one of the best natural sources of magnesium  5) Beans and lentils- high in fiber, magnesium , and potassium 6) Berries- high in flavonoids 7) Amaranth (whole grain, can be cooked similarly to rice and oats)- high in magnesium  and fiber 8) Pistachios- even more effective at reducing BP than other nuts 9) Carrots- high in phenolic compounds that relax blood vessels and reduce inflammation 10) Celery- contain phthalides that relax tissues of arterial walls 11) Tomatoes- can also improve cholesterol and reduce risk of heart disease 12) Broccoli- good source of magnesium , calcium , and potassium 13) Greek yogurt: high in potassium and calcium  14) Herbs and spices: Celery seed, cilantro, saffron, lemongrass, black cumin, ginseng, cinnamon, cardamom, sweet basil, and ginger 15) Chia and flax seeds- also help to lower cholesterol and blood sugar 16) Beets- high levels of nitrates that relax blood vessels  17) spinach and bananas- high in potassium  -Provided lise of supplements that can help with hypertension:  1) magnesium : one high quality  brand is Bioptemizers since it contains all 7 types of magnesium , otherwise over the counter magnesium  gluconate 400mg  is a good option 2) B vitamins 3) vitamin D  4) potassium 5) CoQ10 6) L-arginine 7) Vitamin C 8) Beetroot -Educated that goal BP is 120/80. -Made goal to incorporate some of the above foods into diet.    5) Left heel plantar fascitis: Discussed resolution with ECSWT  6) Right Achiles tendinopathy: -discussed resolution with ECSWT -discussed continued improvement  7) Type 2 diabetes: -Discussed the benefits of intermittent fasting. Recommended starting with pushing dinner 15 minutes earlier and when this feels easy, continuing to push dinner 15 minutes earlier. Discussed that this can help her body to  improve its ability to burn fat rather than glucose, improving insulin sensitivity. Recommended drinking Roobois tea in the evening to help curb appetite and for its numerous health benefits.   -discussed most recent HgbA1c of 6.2. -discussed that she is doing an incredible job with her intermittent fasting -commended eating smaller dinners.  -continue steamed vegetables -continue protein shake -discussed the importance of fiber.   8) Nausea: refilled zofran   9) Stress: Discussed stress from obtaining the custody of her grandkids due to her daughter having issues with drug abuse  10) Sleep apnea: continue CPAP  11) Hyperthyroidism 2/2 Grave's disease: -discussed that recent thyroid  labs have worsened and her symptoms have greatly worsened -referred to endocrinology, discussed that she thinks her endocrinologist said eating greens would help her hyperthyroidism -discussed that she switched to a different endocrinologist who has helped get her levels into the standard range  12) Bilateral knee pain: -discussed that she feel work worsens this pain  13) Obesity: -recommended Paris Harvest MD -discussed pros and cons of GLP-1s -discussed compounded versus injectibles  -discussed that cruciferous vegetables are a great natural source -BMI reviewed and is 40.82 on 7/15 -weight reviewed and is 260 lbs  14) Kidney stone: -continue flomax  -continue ketorolac  prn -continue oxycodone  prn -discussed plan for US  tomorrow -discussed that she has had lithotripsy in the past -discussed that working hurt too much -discussed she was throwing up from pain, and this pain is worse than natural childbirth was for her -discussed that we can help her with short term disability paperwork if she needs -continue lemon water  -continue following with urologist

## 2023-11-26 NOTE — Patient Instructions (Addendum)
 Paris Harvest MD compounded

## 2023-11-27 ENCOUNTER — Encounter: Admitting: Physical Medicine and Rehabilitation

## 2023-12-05 ENCOUNTER — Ambulatory Visit (INDEPENDENT_AMBULATORY_CARE_PROVIDER_SITE_OTHER): Admitting: Family Medicine

## 2023-12-05 ENCOUNTER — Encounter: Payer: Self-pay | Admitting: Family Medicine

## 2023-12-05 DIAGNOSIS — Z6841 Body Mass Index (BMI) 40.0 and over, adult: Secondary | ICD-10-CM

## 2023-12-05 MED ORDER — SEMAGLUTIDE-WEIGHT MANAGEMENT 0.25 MG/0.5ML ~~LOC~~ SOAJ: 0.2500 mg | SUBCUTANEOUS | 0 refills | Status: DC

## 2023-12-05 NOTE — Patient Instructions (Signed)
 Medication sent in.  Follow up in 3 months.

## 2023-12-05 NOTE — Progress Notes (Signed)
 Subjective:  Patient ID: Teresa Vincent, female    DOB: 1980-05-03  Age: 43 y.o. MRN: [id number]  CC:   Chief Complaint  Patient presents with   discuss weight     Injections     HPI:  43 year old female presents for evaluation of the above.  Patient reports that she is struggling to lose weight. BMI currently 41. She is interested in discussing GLP1 medication today.  Patient Active Problem List   Diagnosis Date Noted   Chronic left-sided low back pain 04/04/2023   Morbid obesity (HCC) 03/09/2023   SVT (supraventricular tachycardia) (HCC) 03/21/2022   OSA (obstructive sleep apnea) 11/11/2021   TIA (transient ischemic attack) 11/01/2021   Urinary incontinence 08/29/2021   Chronic pain syndrome 02/28/2021   S/P lumbar fusion 02/28/2018   Prediabetes 12/04/2017   Mixed hyperlipidemia 10/11/2016   Essential hypertension, benign 12/30/2012   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   Anxiety state 08/30/2007   Migraine headache 08/30/2007   Insomnia 08/30/2007    Social Hx   Social History   Socioeconomic History   Marital status: Married    Spouse name: Not on file   Number of children: Not on file   Years of education: Not on file   Highest education level: Not on file  Occupational History   Occupation: Doesn't work outside the home  Tobacco Use   Smoking status: Former    Current packs/day: 0.00    Average packs/day: 1 pack/day for 26.0 years (26.0 ttl pk-yrs)    Types: Cigarettes    Start date: 01/08/1995    Quit date: 01/07/2021    Years since quitting: 2.9   Smokeless tobacco: Former  Building services engineer status: Every Day   Substances: Nicotine , Flavoring  Substance and Sexual Activity   Alcohol use: Yes    Alcohol/week: 0.0 standard drinks of alcohol    Comment: occasional   Drug use: Not Currently    Types: Marijuana    Comment: occasional   Sexual activity: Not Currently    Birth control/protection: Surgical  Other Topics  Concern   Not on file  Social History Narrative   Not on file   Social Drivers of Health   Financial Resource Strain: Not on file  Food Insecurity: Not on file  Transportation Needs: Not on file  Physical Activity: Not on file  Stress: Not on file  Social Connections: Not on file    Review of Systems Per HPI  Objective:  BP 131/73   Pulse 71   Temp 97.7 F (36.5 C)   Ht 5' 7 (1.702 m)   Wt 266 lb (120.7 kg)   LMP 02/25/2016   SpO2 99%   BMI 41.66 kg/m      12/05/2023    3:03 PM 11/26/2023    2:35 PM 10/02/2023    1:55 PM  BP/Weight  Systolic BP 131 117 117  Diastolic BP 73 74 76  Wt. (Lbs) 266 260.2 260.6  BMI 41.66 kg/m2 40.75 kg/m2 40.82 kg/m2    Physical Exam Vitals and nursing note reviewed.  Constitutional:      Appearance: Normal appearance. She is obese.  HENT:     Head: Normocephalic and atraumatic.  Pulmonary:     Effort: Pulmonary effort is normal. No respiratory distress.  Neurological:     Mental Status: She is alert.  Psychiatric:        Mood and Affect: Mood normal.  Behavior: Behavior normal.     Lab Results  Component Value Date   WBC 7.4 09/29/2023   HGB 14.4 09/29/2023   HCT 41.8 09/29/2023   PLT 263 09/29/2023   GLUCOSE 117 (H) 09/29/2023   CHOL 183 05/31/2023   TRIG 351 (H) 05/31/2023   HDL 37 (L) 05/31/2023   LDLDIRECT 134 (H) 10/31/2021   LDLCALC 99 05/31/2023   ALT 21 09/29/2023   AST 19 09/29/2023   NA 138 09/29/2023   K 3.9 09/29/2023   CL 102 09/29/2023   CREATININE 0.74 09/29/2023   BUN 12 09/29/2023   CO2 24 09/29/2023   TSH 0.028 (L) 06/06/2023   INR 0.9 06/06/2023   HGBA1C 6.1 (H) 05/04/2022     Assessment & Plan:  Morbid obesity (HCC) Assessment & Plan: After discussion, starting Wegovy . Discussed side effects, potential cost issues, etc.   Other orders -     Semaglutide -Weight Management; Inject 0.25 mg into the skin once a week.  Dispense: 2 mL; Refill: 0    Follow-up:  3  months  Ethon Wymer Bluford DO [facility]

## 2023-12-06 ENCOUNTER — Telehealth: Payer: Self-pay | Admitting: Pharmacy Technician

## 2023-12-06 ENCOUNTER — Other Ambulatory Visit (HOSPITAL_COMMUNITY): Payer: Self-pay

## 2023-12-06 NOTE — Assessment & Plan Note (Signed)
 After discussion, starting Wegovy . Discussed side effects, potential cost issues, etc.

## 2023-12-06 NOTE — Telephone Encounter (Signed)
 Pharmacy Patient Advocate Encounter   Received notification from CoverMyMeds that prior authorization for Wegovy  0.25mg /0.40ml auto-injectors is required/requested.   Insurance verification completed.   The patient is insured through [facility] .   Per test claim: PA required; PA submitted to above mentioned insurance via Latent Key/confirmation #/EOC [id number] Status is pending

## 2023-12-07 NOTE — Telephone Encounter (Signed)
 Patient called stated insurance advised her last night that provider needs to resend the PA and check the box that says she is in a weight loss program. Please f/u with [facility]

## 2023-12-07 NOTE — Telephone Encounter (Signed)
 Pharmacy Patient Advocate Encounter  Received notification from [facility] that Prior Authorization for Wegovy  0.25mg /0.72ml auto-injectors has been DENIED.  Full denial letter will be uploaded to the media tab. See denial reason below.   PA #/Case ID/Reference #: [id number]

## 2023-12-10 ENCOUNTER — Telehealth: Payer: Self-pay | Admitting: *Deleted

## 2023-12-10 NOTE — Telephone Encounter (Signed)
 Patient notified and stated she joined weight loss program through insurance and will contact them for next steps

## 2023-12-10 NOTE — Telephone Encounter (Signed)
 Patient notified

## 2023-12-10 NOTE — Telephone Encounter (Signed)
 Copied from CRM [id number]. Topic: Clinical - Medication Prior Auth >> Dec 10, 2023  9:30 AM Willma R wrote: Reason for CRM: Patient was denied prior auth for semaglutide -weight management (WEGOVY ) 0.25 MG/0.5ML SOAJ SQ injection, due to no 6 month weight loss program. States she did do a 6 month program just not with her PCP. Is asking for the request to be resent.  Patient can be reached at  [phone]

## 2023-12-26 ENCOUNTER — Other Ambulatory Visit: Payer: Self-pay | Admitting: Physical Medicine and Rehabilitation

## 2023-12-26 ENCOUNTER — Other Ambulatory Visit (HOSPITAL_COMMUNITY): Payer: Self-pay

## 2023-12-26 ENCOUNTER — Encounter: Payer: Self-pay | Admitting: Physical Medicine and Rehabilitation

## 2023-12-26 MED ORDER — OXYCODONE-ACETAMINOPHEN 5-325 MG PO TABS: 1.0000 | ORAL_TABLET | Freq: Two times a day (BID) | ORAL | 0 refills | Status: DC | PRN

## 2023-12-28 DIAGNOSIS — R7309 Other abnormal glucose: Secondary | ICD-10-CM | POA: Diagnosis not present

## 2023-12-28 DIAGNOSIS — E890 Postprocedural hypothyroidism: Secondary | ICD-10-CM | POA: Diagnosis not present

## 2024-01-04 DIAGNOSIS — I10 Essential (primary) hypertension: Secondary | ICD-10-CM | POA: Diagnosis not present

## 2024-01-04 DIAGNOSIS — E890 Postprocedural hypothyroidism: Secondary | ICD-10-CM | POA: Diagnosis not present

## 2024-01-04 DIAGNOSIS — E1165 Type 2 diabetes mellitus with hyperglycemia: Secondary | ICD-10-CM | POA: Diagnosis not present

## 2024-01-04 DIAGNOSIS — E785 Hyperlipidemia, unspecified: Secondary | ICD-10-CM | POA: Diagnosis not present

## 2024-01-08 ENCOUNTER — Emergency Department (HOSPITAL_COMMUNITY)

## 2024-01-08 ENCOUNTER — Encounter (HOSPITAL_COMMUNITY): Payer: Self-pay

## 2024-01-08 ENCOUNTER — Other Ambulatory Visit: Payer: Self-pay

## 2024-01-08 ENCOUNTER — Emergency Department (HOSPITAL_COMMUNITY)
Admission: EM | Admit: 2024-01-08 | Discharge: 2024-01-08 | Disposition: A | Discharge status: Home / Self Care | Attending: Emergency Medicine | Admitting: Emergency Medicine

## 2024-01-08 DIAGNOSIS — R0789 Other chest pain: Secondary | ICD-10-CM | POA: Insufficient documentation

## 2024-01-08 DIAGNOSIS — R09A2 Foreign body sensation, throat: Secondary | ICD-10-CM | POA: Insufficient documentation

## 2024-01-08 DIAGNOSIS — Z03822 Encounter for observation for suspected aspirated (inhaled) foreign body ruled out: Secondary | ICD-10-CM | POA: Diagnosis not present

## 2024-01-08 LAB — PREGNANCY, URINE: Preg Test, Ur: NEGATIVE

## 2024-01-08 MED ORDER — FAMOTIDINE 20 MG PO TABS
20.0000 mg | ORAL_TABLET | Freq: Once | ORAL | Status: DC
  Filled 2024-01-08: qty 1

## 2024-01-08 MED ORDER — ACETAMINOPHEN 500 MG PO TABS
1000.0000 mg | ORAL_TABLET | Freq: Once | ORAL | Status: DC
  Filled 2024-01-08: qty 2

## 2024-01-08 NOTE — ED Notes (Addendum)
 Pt states  I am leaving to go to another hospital because her diagnosis doesn't sit right with her, I spoke with the quack ass PA, I will seek other treatment because I believe something is actually wrong with me, if something happens to me I am going to tell my family to sue him. Joesph Breen RN notified.

## 2024-01-08 NOTE — ED Triage Notes (Signed)
 Pt arrived via POV c/o possible ingestion of a bite from a beef stick. Pt reports she was taking the last bite yesterday and someone made her laugh and Pt feels like she may have inhaled the piece of beef stick. Pt endorses right side chest discomfort and reports she was coughing the rest of the day yesterday. Pt presents in NAD in Triage.

## 2024-01-08 NOTE — Discharge Instructions (Addendum)
 Encounter today was overall reassuring.  X-rays were negative.  As we discussed you likely have some esophageal irritation.  Recommend Tylenol  and ibuprofen  at home.  If you have trouble breathing, start drooling or trouble swallowing or any other concerning symptom please return to the ED for further evaluation.

## 2024-01-08 NOTE — ED Notes (Signed)
 Notified provider that pt would prefer to see the MD

## 2024-01-08 NOTE — ED Provider Notes (Addendum)
 [place] EMERGENCY DEPARTMENT AT [facility] Provider Note   CSN: [id number] Arrival date & time: 01/08/24  9165     Patient presents with: Aspiration  HPI Teresa Vincent is a 43 y.o. female presenting for concern for aspiration.  She states that yesterday she may have accidentally inhaled a bite of a beef stick.  She now is endorsing right sided chest discomfort that started this morning but has since resolved but also has discomfort in the center of her chest that is worse with swallowing.  She still thinks that there is something also stuck in her throat.  But still able to eat and drink.  Denies any trouble breathing.  Denies cough and fever.   HPI     Prior to Admission medications   Medication Sig Start Date End Date Taking? Authorizing Provider  albuterol  (VENTOLIN  HFA) 108 (90 Base) MCG/ACT inhaler Inhale 1-2 puffs into the lungs every 4 (four) hours as needed for wheezing or shortness of breath. 08/29/21   Cook, Jayce G, DO  ALPRAZolam  (XANAX ) 0.5 MG tablet TAKE 1/2 TO 1 (ONE-HALF TO ONE) TABLET BY MOUTH TWICE DAILY AS NEEDED FOR ANXIETY 02/13/23   Cook, Jayce G, DO  Cholecalciferol 50 MCG (2000 UT) CAPS Take 1 capsule by mouth daily.    [provider]  cyclobenzaprine  (FLEXERIL ) 5 MG tablet Take 5 mg by mouth 3 (three) times daily as needed. Patient not taking: Reported on 12/05/2023    [provider]  levothyroxine  (SYNTHROID ) 200 MCG tablet Take 1 tablet (200 mcg total) by mouth daily. Patient not taking: Reported on 12/05/2023 06/13/23   Nida, Gebreselassie W, MD  lisinopril  (ZESTRIL ) 20 MG tablet Take 1 tablet (20 mg total) by mouth daily. 06/05/23   Cook, Jayce G, DO  metoprolol  succinate (TOPROL -XL) 50 MG 24 hr tablet TAKE 1 TABLET DAILY 09/05/23   Cook, Jayce G, DO  omeprazole  (PRILOSEC) 40 MG capsule Take 1 capsule (40 mg total) by mouth 2 (two) times daily before a meal. 07/02/23   Shirlean Therisa ORN, NP  ondansetron  (ZOFRAN -ODT) 4 MG  disintegrating tablet Take 1 tablet (4 mg total) by mouth every 8 (eight) hours as needed for nausea or vomiting. 10/03/22   Cook, Jayce G, DO  oxyCODONE -acetaminophen  (PERCOCET) 5-325 MG tablet Take 1 tablet by mouth 2 (two) times daily as needed for moderate pain (pain score 4-6). 12/26/23 12/25/24  Lorilee Sven SQUIBB, MD  rosuvastatin  (CRESTOR ) 20 MG tablet Take 1 tablet (20 mg total) by mouth at bedtime. 06/13/23   Nida, Gebreselassie W, MD  semaglutide -weight management (WEGOVY ) 0.25 MG/0.5ML SOAJ SQ injection Inject 0.25 mg into the skin once a week. 12/05/23   Cook, Jayce G, DO  SUMAtriptan  (IMITREX ) 100 MG tablet TAKE 1 TABLET BY MOUTH AT THE FIRST SIGN OF MIGRAINE ADDITIONAL DOSE IN 2 HOURS IF HEADACHE PERSISTS OR RECURS 07/30/23   Mauro Elveria BROCKS, NP  SYNTHROID  150 MCG tablet Take 150 mcg by mouth daily. 12/02/23   [provider]    Allergies: Bupivacaine , Contrast media [iodinated contrast media], Indomethacin , Adhesive [tape], and Celexa [citalopram hydrobromide]    Review of Systems See HPI  Updated Vital Signs BP 132/82 (BP Location: Right Arm) Comment: Simultaneous filing. User may not have seen previous data. Comment (BP Location): Simultaneous filing. User may not have seen previous data.  Pulse 63 Comment: Simultaneous filing. User may not have seen previous data.  Temp 98.6 F (37 C) (Oral) Comment: Simultaneous filing. User may not have  seen previous data. Comment (Src): Simultaneous filing. User may not have seen previous data.  Resp 18   Ht 5' 7 (1.702 m)   Wt 120.7 kg   LMP 02/25/2016   SpO2 100%   BMI 41.68 kg/m   Physical Exam Vitals and nursing note reviewed.  HENT:     Head: Normocephalic and atraumatic.     Mouth/Throat:     Mouth: Mucous membranes are moist.  Eyes:     General:        Right eye: No discharge.        Left eye: No discharge.     Conjunctiva/sclera: Conjunctivae normal.  Neck:     Thyroid : No thyroid  mass or thyromegaly.      Comments: No stridor Cardiovascular:     Rate and Rhythm: Normal rate and regular rhythm.     Pulses: Normal pulses.     Heart sounds: Normal heart sounds.  Pulmonary:     Effort: Pulmonary effort is normal.     Breath sounds: Normal air entry. No decreased breath sounds, wheezing, rhonchi or rales.  Abdominal:     General: Abdomen is flat.     Palpations: Abdomen is soft.  Musculoskeletal:     Cervical back: Full passive range of motion without pain, normal range of motion and neck supple.  Lymphadenopathy:     Cervical: No cervical adenopathy.  Skin:    General: Skin is warm and dry.  Neurological:     General: No focal deficit present.  Psychiatric:        Mood and Affect: Mood normal.     (all labs ordered are listed, but only abnormal results are displayed) Labs Reviewed  PREGNANCY, URINE    EKG: None  Radiology: DG Neck Soft Tissue Result Date: 01/08/2024 CLINICAL DATA:  Possible aspiration of a beef stick EXAM: NECK SOFT TISSUES - 2 VIEW COMPARISON:  None Available. FINDINGS: There is no evidence of retropharyngeal soft tissue swelling or epiglottic enlargement. The cervical airway is unremarkable and no radio-opaque foreign body identified. No abnormal soft tissue density along the course of the hypopharynx or trachea. IMPRESSION: No radiopaque foreign body or abnormal soft tissue identified. Electronically Signed   By: Limin  Xu M.D.   On: 01/08/2024 10:53   DG Chest 2 View Result Date: 01/08/2024 CLINICAL DATA:  Possible aspiration. EXAM: CHEST - 2 VIEW COMPARISON:  03/06/2022 FINDINGS: Lungs are adequately inflated without focal airspace consolidation, effusion or pneumothorax. Cardiomediastinal silhouette and remainder of the exam is unchanged. IMPRESSION: No active cardiopulmonary disease. Electronically Signed   By: Toribio Agreste M.D.   On: 01/08/2024 10:53     Procedures   Medications Ordered in the ED  acetaminophen  (TYLENOL ) tablet 1,000 mg (1,000 mg  Oral Patient Refused/Not Given 01/08/24 1108)  famotidine  (PEPCID ) tablet 20 mg (20 mg Oral Patient Refused/Not Given 01/08/24 1108)                                    Medical Decision Making Amount and/or Complexity of Data Reviewed Labs: ordered. Radiology: ordered.  Risk OTC drugs.   43 yo well-appearing female presenting for aspiration concern.  Exam was unremarkable.  DDx includes foreign body in the esophagus or airway, esophageal irritation, malignancy, other.  At this time there is no evidence of respiratory distress she looks well-appearing, x-rays here were negative for acute findings.  Symptoms somewhat relieved with Tylenol  and  Pepcid .  I have low suspicion for aspiration given no adventitious lung sounds and normal x-rays.  It is possible that the throat sensation is likely esophageal irritation.  Fluid challenge with no issue. EKG unremarkable and no chest pain on reassessment.  Advised supportive treatment at home and to follow-up with her PCP.  Discussed return precautions.  Discharged.     Final diagnoses:  Foreign body sensation in throat    ED Discharge Orders     None          Lang Norleen POUR, PA-C 01/08/24 1154    Suzette Pac, MD 01/08/24 1553

## 2024-01-08 NOTE — ED Notes (Signed)
 Patient transported to X-ray

## 2024-01-10 DIAGNOSIS — M25511 Pain in right shoulder: Secondary | ICD-10-CM | POA: Diagnosis not present

## 2024-01-10 DIAGNOSIS — M7711 Lateral epicondylitis, right elbow: Secondary | ICD-10-CM | POA: Diagnosis not present

## 2024-01-18 ENCOUNTER — Other Ambulatory Visit: Payer: Self-pay | Admitting: Family Medicine

## 2024-01-19 DIAGNOSIS — M25511 Pain in right shoulder: Secondary | ICD-10-CM | POA: Diagnosis not present

## 2024-01-19 DIAGNOSIS — M25521 Pain in right elbow: Secondary | ICD-10-CM | POA: Diagnosis not present

## 2024-01-22 ENCOUNTER — Encounter: Attending: Physical Medicine and Rehabilitation | Admitting: Physical Medicine and Rehabilitation

## 2024-01-22 ENCOUNTER — Encounter: Payer: Self-pay | Admitting: Physical Medicine and Rehabilitation

## 2024-01-22 VITALS — BP 113/77 | HR 68 | Ht 67.0 in | Wt 255.0 lb

## 2024-01-22 DIAGNOSIS — G894 Chronic pain syndrome: Secondary | ICD-10-CM | POA: Diagnosis not present

## 2024-01-22 DIAGNOSIS — Z5181 Encounter for therapeutic drug level monitoring: Secondary | ICD-10-CM | POA: Diagnosis not present

## 2024-01-22 DIAGNOSIS — E669 Obesity, unspecified: Secondary | ICD-10-CM | POA: Diagnosis not present

## 2024-01-22 DIAGNOSIS — Z79891 Long term (current) use of opiate analgesic: Secondary | ICD-10-CM | POA: Diagnosis not present

## 2024-01-22 MED ORDER — OXYCODONE-ACETAMINOPHEN 5-325 MG PO TABS: 1.0000 | ORAL_TABLET | Freq: Two times a day (BID) | ORAL | 0 refills | Status: DC | PRN

## 2024-01-22 NOTE — Progress Notes (Signed)
 Subjective:    Patient ID: Teresa Vincent, female    DOB: Dec 26, 1980, 43 y.o.   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  -she has been ok -started B12 and magnesium  -went to her spinal physician and had an injection that worked really well but only lasted 14 days.  -she contacted her spinal physician again and was told she can only have the injection every three months -she was advised to do dry needling once per week for 6 weeks and this does not provide much benefit -tens unit helps temporarily -she has been feeling intermittent sensations for a few months; -feels like a tingling, burning, cooling sensation in lumbar area -has been getting worse she thinks because she has been standing -she thinks something is pinched or a screw was dislodged -she has been super busy at work- has not had a day of since August 15th since she picked up a 2nd job -she needs to go see her mom tomorrow -she was also doing soup kitchen with her church -this is her first weekend and she plans on relaxing -has a tens unit and it does help but if the pain is through the roof then it doesn't help -she tries to use the tens three times per day [name] is a 43 year old woman who presents to establish care for left sided lower back pain that radiates into her left leg.  -she felt no benefit from going to the chiropractor.  -pain has been severe recently -twice a month she is working 7 days a week and she talked with her boss's boss about slowing down a little and he is seeing if this possible.  -she takes care of the garden center at [employer] -Pain has been present for years -She has had two back surgeries.  -She has had 7 injections but got no relief from these.  -She has gone back to Dr. Joshua who  did her surgery.  -She was on oxycodone  5mg  that she broke in half to 2.5s which helped. This helped her to get through the day. She has been on it for years. She has been following with Fidela.  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  -feels better when laying down, but also painful while sitting -today has been a bad day -she uses her electric blanket and heating pad -she has been working at [employer]- she is the cold at 5am in the morning and this is when the pain is worse.  -the colder it is the worse it is hurting -she got a year membership with chiropractor and has been there for a month. She has not found benefit yet.  2) left knee pain: -has had steroid injection with benefit.  -this has continued to benefit.   3) left foot pain -prescribed meloxicam  by Dr. Bluford -seeing a doctor today and will told whether she needs surgical pain. She wants whatever is necessary to stop the pain -her arch stays swollen -she was supposed to do PT last month but was unable to get in until 6/6 -still present  4) Anxiety -she gets anxious about driving and she gets palpitations -she has had a prior stroke and has  felt this anxiety since this time.   5) migraines -has at baseline but last week was very severe, particularly Wednesday -she considered going to the ED on Wednesday due to her pain but pain improved without new intervention by Thursday -she saw her PCP this morning and was recommended to take Imitrex  -she does have a history of remote stroke which was thought to be related to her thyroid  disease -she has been doing ok now -her thyroid  levels had gone way out of control.  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  -lost 22 lbs in a month. She went from 262 to 244.2 today.  -husband is doing the same diet with her and he lost 34 lbs.  -they do portion control sizes and they eat meat once per week -they went vegan with a lot of things, cream cheese and cheese -they have a cheat day once per week.   8) Left foot pain Resolved with ECSWT  9) Achilles tendinosis, right -she is considering surgery -she is scheduled with me for ECSWT  10) Type 2 diabetes -every now and then she will have a cheat day -she thinks her endocrinologist will be upset with her that her HgbA1c improves -she has lost 36 lbs -she has stopped soft drinks -she shows be her improvements in her lipid panel -discussed that se loves greens, hummus -her last bite is often 5:30-6;:30, sometimes 7pm if she is hungry.   11) Right foot pain: -much better after shockwave therapy -continues to be improed  12) Stress: -regarding getting custody of her grandkids as her eldest daughter has a drug addiction  13) Hyperthyroidism: -she would like referral to another endocrinologist -discussed that she has been having chest pain, tachycardia, vision abnormalities, eye twitching  14) Bilateral knee pain: -feels pinch or ache once in a while -has pressure on right knee at work  15) Kidney stone: -she is on flomax  -she was hospitalized -she was treated with fentanyl , toradol  or tramadol , dilaudid  -has been present for 13 days  16) Fatigue: -she found benefit from the B12 -she thinks it is her thyroid  that is  making her tired  Pain Inventory Average Pain 9 Pain Right Now 6 My pain is constant, sharp, burning, dull, stabbing, tingling, and aching  In the last 24 hours, has pain interfered with the following? General activity 8 Relation with others 9 Enjoyment of life 8 What TIME of day is your pain at its worst? morning , daytime, evening, and night Sleep (in general) good Pain is worse with: walking, bending, sitting, inactivity, standing, and some activites Pain improves with: medication Relief from Meds: 9     Family History  Problem Relation Age of Onset   Hypertension Mother    Thyroid  disease Neg Hx    Social History   Socioeconomic History   Marital  status: Married    Spouse name: Not on file   Number of children: Not on file   Years of education: Not on file   Highest education level: Not on file  Occupational History   Occupation: Doesn't work outside the home  Tobacco Use   Smoking status: Former    Current packs/day: 0.00    Average packs/day: 1 pack/day for 26.0 years (26.0 ttl pk-yrs)    Types: Cigarettes    Start date: 01/08/1995    Quit date: 01/07/2021    Years since quitting: 3.0   Smokeless tobacco: Former  Building Services Engineer status: Every Day   Substances: Nicotine , Flavoring  Substance and Sexual Activity   Alcohol use: Yes    Alcohol/week: 0.0 standard drinks of alcohol    Comment: occasional   Drug use: Not Currently    Types: Marijuana    Comment: occasional   Sexual activity: Not Currently    Birth control/protection: Surgical  Other Topics Concern   Not on file  Social History Narrative   Not on file   Social Drivers of Health   Financial Resource Strain: Not on file  Food Insecurity: Not on file  Transportation Needs: Not on file  Physical Activity: Not on file  Stress: Not on file  Social Connections: Not on file   Past Surgical History:  Procedure Laterality Date   ABDOMINAL EXPOSURE N/A 05/29/2017   Procedure: ABDOMINAL  EXPOSURE;  Surgeon: Eliza Lonni RAMAN, MD;  Location: [facility] OR;  Service: Vascular;  Laterality: N/A;   ABDOMINAL HYSTERECTOMY     ANTERIOR LUMBAR FUSION N/A 05/29/2017   Procedure: Anterior Lumbar Interbody Fusion  - Lumbar five sacral one;  Surgeon: Joshua Alm RAMAN, MD;  Location: [facility] OR;  Service: Neurosurgery;  Laterality: N/A;   BIOPSY  02/17/2020   Procedure: BIOPSY;  Surgeon: Cindie Carlin POUR, DO;  Location: [facility];  Service: Endoscopy;;  gastric   CARPAL TUNNEL RELEASE Bilateral    CHOLECYSTECTOMY     CHOLECYSTECTOMY N/A 12/29/2016   Procedure: LAPAROSCOPIC CHOLECYSTECTOMY;  Surgeon: Kallie Manuelita BROCKS, MD;  Location: [facility] ORS;  Service: General;  Laterality: N/A;   COLONOSCOPY WITH PROPOFOL  N/A 02/17/2020   non-bleeding internal hemorrhoids, otherwise normal.   CYSTOSCOPY/URETEROSCOPY/HOLMIUM LASER/STENT PLACEMENT Right 06/25/2019   Procedure: CYSTOSCOPY WITH RIGHT RETROGRADE PYELOGRAM/RIGHT URETEROSCOPY/HOLMIUM LASER APPLICATION RIGHT URETERAL CALCULUS/RIGHT URETERAL STENT PLACEMENT;  Surgeon: Sherrilee Belvie CROME, MD;  Location: [facility];  Service: Urology;  Laterality: Right;   ESOPHAGOGASTRODUODENOSCOPY (EGD) WITH PROPOFOL  N/A 02/17/2020   Procedure: ESOPHAGOGASTRODUODENOSCOPY (EGD) WITH PROPOFOL ;  Surgeon: Cindie Carlin POUR, DO;  Location: [facility];  Service: Endoscopy;  Laterality: N/A;   EXTRACORPOREAL SHOCK WAVE LITHOTRIPSY Right 04/10/2019   Procedure: EXTRACORPOREAL SHOCK WAVE LITHOTRIPSY (ESWL);  Surgeon: Elisabeth Valli BIRCH, MD;  Location: [facility];  Service: Urology;  Laterality: Right;   LAMINECTOMY WITH POSTERIOR LATERAL ARTHRODESIS LEVEL 1 N/A 02/28/2018   Procedure: Laminectomy and Foraminotomy Lumbar Five-Sacral One, posterolateral fusion and fixation;  Surgeon: Joshua Alm RAMAN, MD;  Location: [facility] OR;  Service: Neurosurgery;  Laterality: N/A;  posterior   LUMBAR EPIDURAL INJECTION     STERIOD INJECTION N/A 01/10/2017   Procedure: MINOR EXPAREL   INJECTION;  Surgeon: Kallie Manuelita BROCKS, MD;  Location: [facility];  Service: General;  Laterality: N/A;   TOTAL ABDOMINAL HYSTERECTOMY W/ BILATERAL SALPINGOOPHORECTOMY N/A    TUBAL LIGATION  2003   Past Medical History:  Diagnosis Date   Abnormal Pap smear of cervix  ANXIETY 08/30/2007   Asthma    Chronic back pain    Claustrophobia    pt requests Versed  before surgery.   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   LMP 02/25/2016   Opioid Risk Score:   Fall Risk Score:  `1  Depression screen PHQ 2/9     12/05/2023    3:16 PM 10/02/2023    1:55 PM 08/07/2023    2:10 PM 04/03/2023    2:16 PM 12/05/2022   12:24 PM 10/02/2022    2:55 PM 04/07/2022    1:39 PM  Depression screen PHQ 2/9  Decreased Interest 0 0 0 0 0 0 0  Down, Depressed, Hopeless 0 0 0 0 0 0 0  PHQ - 2 Score 0 0 0 0 0 0 0  Altered sleeping 0      2  Tired, decreased energy 2      0  Change in appetite 2      0  Feeling bad or failure about yourself  0      0  Trouble concentrating 0      0  Moving slowly or fidgety/restless 0      0  Suicidal thoughts 0      0  PHQ-9 Score 4      2  Difficult doing work/chores Somewhat difficult      Somewhat difficult    Review of Systems  HENT: Negative.    Eyes: Negative.   Respiratory: Negative.    Gastrointestinal: Negative.   Endocrine: Negative.   Genitourinary: Negative.   Musculoskeletal:  Positive for arthralgias and back pain.       Pain in both legs, left hip pain  Skin: Negative.   Allergic/Immunologic: Negative.   Neurological:        Tingling  Hematological: Negative.   All other systems  reviewed and are negative.      Objective:   Physical Exam Gen: no distress, normal appearing, weight 255 lbs, BMI 39.94, 113/77 HEENT: oral mucosa pink and moist, NCAT, exopthalmos Gen: no distress, normal appearing HEENT: oral mucosa pink and moist, NCAT Cardio: Reg rate Gen: no distress, normal appearing HEENT: oral mucosa pink and moist, NCAT Cardio: Reg rate Chest: normal effort, normal rate of breathing Abd: soft, non-distended Ext: no edema Psych: pleasant, normal affect Musculoskeletal: Normal ambulation, right achiles tendinopathy, left heel spur, negative slump test, left sided lower extremity weakness, stable 11/4    Assessment & Plan:  Mrs. Mausolf is a 43 year old woman who presents to establish care for lumbar spinal stenosis/neuropathy  1) Lumbar spinal stenosis with lumbar neurogenic claudication.  -Discussed benefits of exercise in reducing pain. -UDS ordered -refilled percocet -discussed that pain is well controlled, that she deferred surgical eval as she is losing weight   -discussed that Journavx is a highly selective inhibitor for Nav 1.8, which is specific for pain in the peripheral nervous system, discussed that lidocaine  in contrast affects all Nav receptors, discussed that patient can try using this medication as  prn for pain, though its studies have focused on its use for acute pain. Discussed that it has been studied against opioids for acute pain with comparable efficacy. Discussed that I have not seen any patient side effects thus far. Discussed that we have samples available and we have copay cards available. Discussed that outpatient if the medication requires a prior auth the copay should be $30 for at least a 60 day supply. The medication may be more likely to be in stock in [facility] and [facility]. We do have samples available.   -discussed that gabapentin  negatively impacted her memory and did not help her pain, discussed that she was taking 900mg  QID for  1 year -discussed that her spinal surgeon does not feel that she needs surgery at this time -discussed that she had a steroid injection and this provided 14 days of relief -discussed that lidocaine  patch was not helpful -refilled Percocet -discussed disc height loss on recent XR -continue D3  -discussed that she is following with Dr. Joshua -failed lidocaine  patches -discussed avoiding topamax  given that this can cause hyperthyroidism -continue light restriction on bending and twisting -discussed that pain has acutely worsened -discussed that she got an XR and an MRI of her lumbar spine -Discussed current symptoms of pain and history of pain.  -Discussed benefits of exercise in reducing pain. -Discussed following foods that may reduce pain: 1) Ginger (especially studied for arthritis)- reduce leukotriene production to decrease inflammation 2) Blueberries- high in phytonutrients that decrease inflammation 3) Salmon- marine omega-3s reduce joint swelling and pain 4) Pumpkin seeds- reduce inflammation 5) dark chocolate- reduces inflammation 6) turmeric- reduces inflammation 7) tart cherries - reduce pain and stiffness 8) extra virgin olive oil - its compound olecanthal helps to block prostaglandins  9) chili peppers- can be eaten or applied topically via capsaicin 10) mint- helpful for headache, muscle aches, joint pain, and itching 11) garlic- reduces inflammation  Link to further information on diet for chronic pain: [email or web address].[email or web address]   -discussed that her stress has not worsen her pain Continue heat therapy -discussed negative response to gabapentin  in the past.  Prescribing Home Zynex NexWave Stimulator Device and supplies as needed. IFC, NMES and TENS medically necessary Treatment Rx: Daily @ 30-40 minutes per treatment PRN. Zynex NexWave only, no substitutions. Treatment Goals: 1) To reduce and/or eliminate  pain 2) To improve functional capacity and Activities of daily living 3) To reduce or prevent the need for oral medications 4) To improve circulation in the injured region 5) To decrease or prevent muscle spasm and muscle atrophy 6) To provide a self-management tool to the patient The patient has not sufficiently improved with conservative care. Numerous studies indexed by Medline and PubMed.gov have shown Neuromuscular, Interferential, and TENS stimulators to reduce pain, improve function, and reduce medication use in injured patients. Continued use of this evidence based, safe, drug free treatment is both reasonable and medically necessary at this time.    Continue UDS monitoring -Discussed current symptoms -She is allergic to bupivicaine, she has tried surgery, injections, medication. Oxycodone  5mg  helped. She has never failed a drug screen. She works at [employer]. Increase oxycodone  to 5mg  daily.  -Will obtain UDS and pain contract today. If results negative, can prescribe Percocet 5mg -325 monthly PRN.  Lumbar MRI reviewed as follows:  IMPRESSION: 1. No impingement identified in the lumbar spine. 2. 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 3. Degenerative disc disease at T11-12, L2- 3, L3-4, and L5-S1. 4. Cholelithiasis.  2) Obesity: weight 260 lbs, BMI 40.75 -discussed risks and benefits from GLP-1s, discussed Rybelsius, discussed that she can get this prescribed by her PCP or [facility] MD, discussed risks of nausea, malnutrition, discussed her husband's experience with Zepbound, discussed that when she eats healthier she is able to lose more weight, discussed that she is eating more cold vegetables -Educated regarding health benefits of weight loss- for pain, general health, chronic disease prevention, immune health, mental health.  -continue black coffee -Educated regarding health benefits of weight loss- for pain, general health, chronic disease prevention,  immune health, mental health.  -Will monitor weight every visit.  -Consider Roobois tea daily.  -Discussed the benefits of intermittent fasting. -Discussed foods that can assist in weight loss: 1) leafy greens- high in fiber and nutrients 2) dark chocolate- improves metabolism (if prefer sweetened, best to sweeten with honey instead of sugar).  3) cruciferous vegetables- high in fiber and protein 4) full fat yogurt: high in healthy fat, protein, calcium , and probiotics 5) apples- high in a variety of phytochemicals 6) nuts- high in fiber and protein that increase feelings of fullness 7) grapefruit: rich in nutrients, antioxidants, and fiber (not to be taken with anticoagulation) 8) beans- high in protein and fiber 9) salmon- has high quality protein and healthy fats 10) green tea- rich in polyphenols 11) eggs- rich in choline and vitamin D  12) tuna- high protein, boosts metabolism 13) avocado- decreases visceral abdominal fat 14) chicken (pasture raised): high in protein and iron 15) blueberries- reduce abdominal fat and cholesterol 16) whole grains- decreases calories retained during digestion, speeds metabolism 17) chia seeds- curb appetite 18) chilies- increases fat metabolism  -Discussed supplements that can be used:  1) Metatrim 400mg  BID 30 minutes before breakfast and dinner  2) Sphaeranthus indicus and Garcinia mangostana (combinations of these and #1 can be found in capsicum and zychrome  3) green coffee bean extract 400mg  twice per day or Irvingia (african mango) 150 to 300mg  twice per day.   3) migraines -agree with PCP that imitrex  is a great option. Discussed with her that there is increased risk of stroke. She states thyroid  function is currently well controlled -discussed any potential triggers- she notes new perfume last week which could have been a trigger -advised to continue to be attentive for other food or chemical related triggers   4) HTN: -Advised checking  BP daily at home and logging results to bring into follow-up appointment with PCP and myself. -Reviewed BP meds today.  -Advised regarding healthy foods that can help lower blood pressure and provided with a list: 1) citrus foods- high in vitamins and minerals 2) salmon and other fatty fish - reduces inflammation and oxylipins 3) swiss chard (leafy green)- high level of nitrates 4) pumpkin seeds- one of the best natural sources of magnesium  5) Beans and lentils- high in fiber, magnesium , and potassium 6) Berries- high in flavonoids 7) Amaranth (whole grain, can be cooked similarly to rice and oats)- high in magnesium  and fiber 8) Pistachios- even more effective at reducing BP than other nuts 9) Carrots- high in phenolic compounds that relax blood vessels and reduce inflammation 10) Celery- contain phthalides that relax tissues of arterial walls 11) Tomatoes- can also improve cholesterol and reduce risk of heart disease 12) Broccoli- good source of magnesium , calcium , and potassium 13) Greek yogurt: high in potassium and calcium  14) Herbs and spices: Celery seed, cilantro, saffron, lemongrass, black cumin, ginseng, cinnamon, cardamom, sweet basil, and ginger 15) Chia and  flax seeds- also help to lower cholesterol and blood sugar 16) Beets- high levels of nitrates that relax blood vessels  17) spinach and bananas- high in potassium  -Provided lise of supplements that can help with hypertension:  1) magnesium : one high quality brand is Bioptemizers since it contains all 7 types of magnesium , otherwise over the counter magnesium  gluconate 400mg  is a good option 2) B vitamins 3) vitamin D  4) potassium 5) CoQ10 6) L-arginine 7) Vitamin C 8) Beetroot -Educated that goal BP is 120/80. -Made goal to incorporate some of the above foods into diet.    5) Left heel plantar fascitis: Discussed resolution with ECSWT  6) Right Achiles tendinopathy: -discussed resolution with ECSWT -discussed  continued improvement  7) Type 2 diabetes: -Discussed the benefits of intermittent fasting. Recommended starting with pushing dinner 15 minutes earlier and when this feels easy, continuing to push dinner 15 minutes earlier. Discussed that this can help her body to improve its ability to burn fat rather than glucose, improving insulin sensitivity. Recommended drinking Roobois tea in the evening to help curb appetite and for its numerous health benefits.   -discussed most recent HgbA1c of 6.2. -discussed that she is doing an incredible job with her intermittent fasting -commended eating smaller dinners.  -continue steamed vegetables -continue protein shake -discussed the importance of fiber.   8) Nausea: refilled zofran   9) Stress: Discussed stress from obtaining the custody of her grandkids due to her daughter having issues with drug abuse  10) Sleep apnea: continue CPAP  11) Hyperthyroidism 2/2 Grave's disease: -discussed that recent thyroid  labs have worsened and her symptoms have greatly worsened -referred to endocrinology, discussed that she thinks her endocrinologist said eating greens would help her hyperthyroidism -discussed that she switched to a different endocrinologist who has helped get her levels into the standard range  12) Bilateral knee pain: -discussed that she feel work worsens this pain  13) Obesity: -continue Moujaro, discussed that she prayed upon whether she should start the injections -discussed that she is aiming for no more than 9 grams of fiber at a time -discussed that herbal teas can be good for you -recommended Paris Harvest MD -discussed pros and cons of GLP-1s -discussed compounded versus injectibles  -discussed that cruciferous vegetables are a great natural source -BMI reviewed and is 40.82 on 7/15 -weight reviewed and is 260 lbs  14) Kidney stone: -continue flomax  -continue ketorolac  prn -continue oxycodone  prn -discussed plan for [place]   tomorrow -discussed that she has had lithotripsy in the past -discussed that working hurt too much -discussed she was throwing up from pain, and this pain is worse than natural childbirth was for her -discussed that we can help her with short term disability paperwork if she needs -continue lemon water  -continue following with urologist

## 2024-01-24 DIAGNOSIS — M7711 Lateral epicondylitis, right elbow: Secondary | ICD-10-CM | POA: Diagnosis not present

## 2024-01-24 DIAGNOSIS — M25511 Pain in right shoulder: Secondary | ICD-10-CM | POA: Diagnosis not present

## 2024-01-25 LAB — DRUG TOX MONITOR 1 W/CONF, ORAL FLD
Amphetamines: NEGATIVE ng/mL (ref <10)
Barbiturates: NEGATIVE ng/mL (ref <10)
Benzodiazepines: NEGATIVE ng/mL (ref <0.50)
Buprenorphine: NEGATIVE ng/mL (ref <0.10)
Cocaine: NEGATIVE ng/mL (ref <5.0)
Codeine: NEGATIVE ng/mL (ref <2.5)
Cotinine: 69.9 ng/mL — ABNORMAL HIGH (ref <5.0)
Dihydrocodeine: NEGATIVE ng/mL (ref <2.5)
Fentanyl: NEGATIVE ng/mL (ref <0.10)
Heroin Metabolite: NEGATIVE ng/mL (ref <1.0)
Hydrocodone: NEGATIVE ng/mL (ref <2.5)
Hydromorphone: NEGATIVE ng/mL (ref <2.5)
MARIJUANA: NEGATIVE ng/mL (ref <2.5)
MDMA: NEGATIVE ng/mL (ref <10)
Meprobamate: NEGATIVE ng/mL (ref <2.5)
Methadone: NEGATIVE ng/mL (ref <5.0)
Morphine: NEGATIVE ng/mL (ref <2.5)
Nicotine Metabolite: POSITIVE ng/mL — AB (ref <5.0)
Norhydrocodone: NEGATIVE ng/mL (ref <2.5)
Noroxycodone: NEGATIVE ng/mL (ref <2.5)
Opiates: POSITIVE ng/mL — AB (ref <2.5)
Oxycodone: 5.4 ng/mL — ABNORMAL HIGH (ref <2.5)
Oxymorphone: NEGATIVE ng/mL (ref <2.5)
Phencyclidine: NEGATIVE ng/mL (ref <10)
Tapentadol: NEGATIVE ng/mL (ref <5.0)
Tramadol: NEGATIVE ng/mL (ref <5.0)
Zolpidem: NEGATIVE ng/mL (ref <5.0)

## 2024-01-25 LAB — DRUG TOX ALC METAB W/CON, ORAL FLD: Alcohol Metabolite: NEGATIVE ng/mL (ref <25)

## 2024-01-31 DIAGNOSIS — M25511 Pain in right shoulder: Secondary | ICD-10-CM | POA: Diagnosis not present

## 2024-02-01 ENCOUNTER — Encounter (HOSPITAL_COMMUNITY): Payer: Self-pay

## 2024-02-01 ENCOUNTER — Emergency Department (HOSPITAL_COMMUNITY)
Admission: EM | Admit: 2024-02-01 | Discharge: 2024-02-01 | Discharge status: Against Medical Advice | Attending: Emergency Medicine | Admitting: Emergency Medicine

## 2024-02-01 ENCOUNTER — Other Ambulatory Visit: Payer: Self-pay

## 2024-02-01 DIAGNOSIS — R112 Nausea with vomiting, unspecified: Secondary | ICD-10-CM | POA: Insufficient documentation

## 2024-02-01 DIAGNOSIS — Z5321 Procedure and treatment not carried out due to patient leaving prior to being seen by health care provider: Secondary | ICD-10-CM | POA: Insufficient documentation

## 2024-02-01 DIAGNOSIS — F419 Anxiety disorder, unspecified: Secondary | ICD-10-CM | POA: Diagnosis not present

## 2024-02-01 DIAGNOSIS — R42 Dizziness and giddiness: Secondary | ICD-10-CM | POA: Diagnosis not present

## 2024-02-01 DIAGNOSIS — I10 Essential (primary) hypertension: Secondary | ICD-10-CM | POA: Diagnosis not present

## 2024-02-01 NOTE — ED Notes (Signed)
 Pt stated she feels much better and more stable. Advised staff she is going to go to [facility] to resume care. OTF

## 2024-02-01 NOTE — ED Triage Notes (Signed)
 Patient bib PTAR from [facility] with complaints of anxiety, nausea, vomiting, dizziness, hot flashes, thyroid  issues. EMS reports stroke screen negative. All symptoms started around 8:45 this morning when she stood up.

## 2024-02-07 DIAGNOSIS — M7711 Lateral epicondylitis, right elbow: Secondary | ICD-10-CM | POA: Diagnosis not present

## 2024-02-08 DIAGNOSIS — E890 Postprocedural hypothyroidism: Secondary | ICD-10-CM | POA: Diagnosis not present

## 2024-02-11 ENCOUNTER — Encounter: Payer: Self-pay | Admitting: Physical Medicine and Rehabilitation

## 2024-02-16 DIAGNOSIS — J069 Acute upper respiratory infection, unspecified: Secondary | ICD-10-CM | POA: Diagnosis not present

## 2024-02-16 DIAGNOSIS — R059 Cough, unspecified: Secondary | ICD-10-CM | POA: Diagnosis not present

## 2024-02-18 ENCOUNTER — Telehealth: Payer: Self-pay | Admitting: Physical Medicine and Rehabilitation

## 2024-02-18 NOTE — Telephone Encounter (Signed)
 Pt is having sx 12/22 so she cancelled her appt 12/23 she wants to know does she need to reschedule or can you refill it without her coming in.

## 2024-02-27 ENCOUNTER — Encounter: Payer: Self-pay | Admitting: Physical Medicine and Rehabilitation

## 2024-03-03 MED ORDER — OXYCODONE-ACETAMINOPHEN 5-325 MG PO TABS: 1.0000 | ORAL_TABLET | Freq: Two times a day (BID) | ORAL | 0 refills | Status: DC | PRN

## 2024-03-10 DIAGNOSIS — Y999 Unspecified external cause status: Secondary | ICD-10-CM | POA: Diagnosis not present

## 2024-03-10 DIAGNOSIS — S43491A Other sprain of right shoulder joint, initial encounter: Secondary | ICD-10-CM | POA: Diagnosis not present

## 2024-03-10 DIAGNOSIS — M7581 Other shoulder lesions, right shoulder: Secondary | ICD-10-CM | POA: Diagnosis not present

## 2024-03-10 DIAGNOSIS — M19011 Primary osteoarthritis, right shoulder: Secondary | ICD-10-CM | POA: Diagnosis not present

## 2024-03-10 DIAGNOSIS — M75111 Incomplete rotator cuff tear or rupture of right shoulder, not specified as traumatic: Secondary | ICD-10-CM | POA: Diagnosis not present

## 2024-03-10 DIAGNOSIS — M94211 Chondromalacia, right shoulder: Secondary | ICD-10-CM | POA: Diagnosis not present

## 2024-03-10 DIAGNOSIS — M7521 Bicipital tendinitis, right shoulder: Secondary | ICD-10-CM | POA: Diagnosis not present

## 2024-03-10 DIAGNOSIS — M7541 Impingement syndrome of right shoulder: Secondary | ICD-10-CM | POA: Diagnosis not present

## 2024-03-10 DIAGNOSIS — M7711 Lateral epicondylitis, right elbow: Secondary | ICD-10-CM | POA: Diagnosis not present

## 2024-03-10 DIAGNOSIS — M7551 Bursitis of right shoulder: Secondary | ICD-10-CM | POA: Diagnosis not present

## 2024-03-10 DIAGNOSIS — X58XXXA Exposure to other specified factors, initial encounter: Secondary | ICD-10-CM | POA: Diagnosis not present

## 2024-03-10 DIAGNOSIS — G8918 Other acute postprocedural pain: Secondary | ICD-10-CM | POA: Diagnosis not present

## 2024-03-11 ENCOUNTER — Encounter: Admitting: Physical Medicine and Rehabilitation

## 2024-03-17 DIAGNOSIS — M25511 Pain in right shoulder: Secondary | ICD-10-CM | POA: Diagnosis not present

## 2024-03-18 ENCOUNTER — Ambulatory Visit: Admitting: Physical Medicine and Rehabilitation

## 2024-03-25 ENCOUNTER — Encounter: Payer: Self-pay | Admitting: Physical Medicine and Rehabilitation

## 2024-03-25 ENCOUNTER — Encounter: Attending: Physical Medicine and Rehabilitation | Admitting: Physical Medicine and Rehabilitation

## 2024-03-25 VITALS — BP 96/66 | HR 78 | Ht 67.0 in | Wt 247.0 lb

## 2024-03-25 DIAGNOSIS — G43109 Migraine with aura, not intractable, without status migrainosus: Secondary | ICD-10-CM | POA: Insufficient documentation

## 2024-03-25 DIAGNOSIS — I10 Essential (primary) hypertension: Secondary | ICD-10-CM | POA: Insufficient documentation

## 2024-03-25 DIAGNOSIS — G4701 Insomnia due to medical condition: Secondary | ICD-10-CM | POA: Insufficient documentation

## 2024-03-25 DIAGNOSIS — M25511 Pain in right shoulder: Secondary | ICD-10-CM | POA: Diagnosis not present

## 2024-03-25 DIAGNOSIS — G8929 Other chronic pain: Secondary | ICD-10-CM | POA: Diagnosis not present

## 2024-03-25 MED ORDER — LISINOPRIL 10 MG PO TABS: 10.0000 mg | ORAL_TABLET | Freq: Every day | ORAL | 3 refills | Status: AC

## 2024-03-25 MED ORDER — OXYCODONE-ACETAMINOPHEN 5-325 MG PO TABS: 1.0000 | ORAL_TABLET | Freq: Four times a day (QID) | ORAL | 0 refills | Status: AC | PRN

## 2024-03-25 NOTE — Progress Notes (Signed)
 "  Subjective:    Patient ID: Teresa Vincent, female    DOB: 02-12-1981, 44 y.o.   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  -she has been ok -started B12 and magnesium  -went to her spinal physician and had an injection that worked really well but only lasted 14 days.  -she contacted her spinal physician again and was told she can only have the injection every three months -she was advised to do dry needling once per week for 6 weeks and this does not provide much benefit -tens unit helps temporarily -she has been feeling intermittent sensations for a few months; -feels like a tingling, burning, cooling sensation in lumbar area -has been getting worse she thinks because she has been standing -she thinks something is pinched or a screw was dislodged -she has been super busy at work- has not had a day of since August 15th since she picked up a 2nd job -she needs to go see her mom tomorrow -she was also doing soup kitchen with her church -this is her first weekend and she plans on relaxing -has a tens unit and it does help but if the pain is through the roof then it doesn't help -she tries to use the tens three times per day Teresa Vincent is a 44 year old woman who presents to establish care for left sided lower back pain that radiates into her left leg.  -she felt no benefit from going to the chiropractor.  -pain has been severe recently -twice a month she is working 7 days a week and she talked with her boss's boss about slowing down a little and he is seeing if this possible.  -she takes care of the garden center at [employer] -Pain has been present for years -She has had two back surgeries.  -She has had 7 injections but got no relief from these.  -She has gone back to Dr. Joshua who  did her surgery.  -She was on oxycodone  5mg  that she broke in half to 2.5s which helped. This helped her to get through the day. She has been on it for years. She has been following with Fidela.  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  -feels better when laying down, but also painful while sitting -today has been a bad day -she uses her electric blanket and heating pad -she has been working at [employer]- she is the cold at 5am in the morning and this is when the pain is worse.  -the colder it is the worse it is hurting -she got a year membership with chiropractor and has been there for a month. She has not found benefit yet.  2) left knee pain: -has had steroid injection with benefit.  -this has continued to benefit.   3) left foot pain -prescribed meloxicam  by Dr. Bluford -seeing a doctor today and will told whether she needs surgical pain. She wants whatever is necessary to stop the pain -her arch stays swollen -she was supposed to do PT last month but was unable to get in until 6/6 -still present  4) Anxiety -she gets anxious about driving and she gets palpitations -she has had a prior stroke and  has felt this anxiety since this time.   5) migraines -has at baseline but last week was very severe, particularly Wednesday -she considered going to the ED on Wednesday due to her pain but pain improved without new intervention by Thursday -she saw her PCP this morning and was recommended to take Imitrex  -she does have a history of remote stroke which was thought to be related to her thyroid  disease -she has been doing ok now -her thyroid  levels had gone way out of control.  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  -lost 22 lbs in a month. She went from 262 to 244.2 today.  -husband is doing the same diet with her and he lost 34 lbs.  -they do portion control sizes and they eat meat once per week -they went vegan with a lot of things, cream cheese and cheese -they have a cheat day once per week.   8) Left foot pain Resolved with ECSWT  9) Achilles tendinosis, right -she is considering surgery -she is scheduled with me for ECSWT  10) Type 2 diabetes -every now and then she will have a cheat day -she thinks her endocrinologist will be upset with her that her HgbA1c improves -she has lost 36 lbs -she has stopped soft drinks -she shows be her improvements in her lipid panel -discussed that se loves greens, hummus -her last bite is often 5:30-6;:30, sometimes 7pm if she is hungry.   11) Right foot pain: -much better after shockwave therapy -continues to be improed  12) Stress: -regarding getting custody of her grandkids as her eldest daughter has a drug addiction  13) Hyperthyroidism: -she would like referral to another endocrinologist -discussed that she has been having chest pain, tachycardia, vision abnormalities, eye twitching  14) Bilateral knee pain: -feels pinch or ache once in a while -has pressure on right knee at work  15) Kidney stone: -she is on flomax  -she was hospitalized -she was treated with fentanyl , toradol  or tramadol , dilaudid  -has been present for 13 days  16) Fatigue: -she found benefit from the B12 -she thinks it is her thyroid  that is  making her tired  17) Right shoulder pain: -surgeon saud everything is healing well -the nerve block only lasted 24 hours -she called the on call doctor and the oxycodone  that was given was not helpful -she tried turmeric tea -taking 2 oxycodone  and tylenol  was helpful  18) Insomnia: -this is present due to her pain  Pain Inventory Average Pain 9 Pain Right Now 6 My pain is constant, sharp, burning, dull, stabbing, tingling, and aching  In the last 24 hours, has pain interfered with the following? General activity 8 Relation with others 9 Enjoyment of life 8 What TIME of day is your pain at its worst? morning , daytime, evening, and night Sleep (in general) good  Pain is worse with: walking, bending, sitting, inactivity, standing, and some activites Pain improves with: medication Relief from Meds: 9     Family History  Problem Relation Age of Onset   Hypertension Mother    Thyroid  disease Neg Hx    Social History   Socioeconomic History   Marital status: Married    Spouse name: Not on file   Number of children: Not on file   Years of education: Not on file   Highest education level: Not on file  Occupational History   Occupation: Doesn't work outside the home  Tobacco Use   Smoking status: Former    Current packs/day: 0.00    Average packs/day: 1 pack/day for 26.0 years (26.0 ttl pk-yrs)    Types: Cigarettes    Start date: 01/08/1995    Quit date: 01/07/2021    Years since quitting: 3.2   Smokeless tobacco: Former  Building Services Engineer status: Every Day   Substances: Nicotine , Flavoring  Substance and Sexual Activity   Alcohol use: Yes    Alcohol/week: 0.0 standard drinks of alcohol    Comment: occasional   Drug use: Not Currently    Types: Marijuana    Comment: occasional   Sexual activity: Not Currently    Birth control/protection: Surgical  Other Topics Concern   Not on file  Social History Narrative   Not on file   Social Drivers of Health    Tobacco Use: Medium Risk (02/01/2024)   Patient History    Smoking Tobacco Use: Former    Smokeless Tobacco Use: Former    Passive Exposure: Not on Actuary Strain: Not on file  Food Insecurity: Not on file  Transportation Needs: Not on file  Physical Activity: Not on file  Stress: Not on file  Social Connections: Not on file  Depression (PHQ2-9): Low Risk (01/22/2024)   Depression (PHQ2-9)    PHQ-2 Score: 0  Alcohol Screen: Not on file  Housing: Not on file  Utilities: Not on file  Health Literacy: Not on file   Past Surgical History:  Procedure Laterality Date   ABDOMINAL EXPOSURE N/A 05/29/2017   Procedure: ABDOMINAL EXPOSURE;  Surgeon: Eliza Lonni RAMAN, MD;  Location: [facility] OR;  Service: Vascular;  Laterality: N/A;   ABDOMINAL HYSTERECTOMY     ANTERIOR LUMBAR FUSION N/A 05/29/2017   Procedure: Anterior Lumbar Interbody Fusion  - Lumbar five sacral one;  Surgeon: Joshua Alm RAMAN, MD;  Location: [facility] OR;  Service: Neurosurgery;  Laterality: N/A;   BIOPSY  02/17/2020   Procedure: BIOPSY;  Surgeon: Cindie Carlin POUR, DO;  Location: [facility];  Service: Endoscopy;;  gastric   CARPAL TUNNEL RELEASE Bilateral    CHOLECYSTECTOMY     CHOLECYSTECTOMY N/A 12/29/2016   Procedure: LAPAROSCOPIC CHOLECYSTECTOMY;  Surgeon: Kallie Manuelita BROCKS, MD;  Location: [facility];  Service: General;  Laterality: N/A;   COLONOSCOPY WITH PROPOFOL  N/A 02/17/2020   non-bleeding internal hemorrhoids, otherwise normal.   CYSTOSCOPY/URETEROSCOPY/HOLMIUM LASER/STENT PLACEMENT Right 06/25/2019   Procedure: CYSTOSCOPY WITH RIGHT RETROGRADE PYELOGRAM/RIGHT URETEROSCOPY/HOLMIUM LASER APPLICATION RIGHT URETERAL CALCULUS/RIGHT URETERAL STENT PLACEMENT;  Surgeon: Sherrilee Belvie CROME, MD;  Location: [facility];  Service: Urology;  Laterality: Right;   ESOPHAGOGASTRODUODENOSCOPY (EGD) WITH PROPOFOL  N/A 02/17/2020   Procedure: ESOPHAGOGASTRODUODENOSCOPY (EGD) WITH PROPOFOL ;  Surgeon: Cindie Carlin POUR, DO;   Location: [facility];  Service: Endoscopy;  Laterality: N/A;   EXTRACORPOREAL SHOCK WAVE LITHOTRIPSY Right 04/10/2019   Procedure: EXTRACORPOREAL SHOCK WAVE LITHOTRIPSY (ESWL);  Surgeon:  Elisabeth Valli BIRCH, MD;  Location: [facility];  Service: Urology;  Laterality: Right;   LAMINECTOMY WITH POSTERIOR LATERAL ARTHRODESIS LEVEL 1 N/A 02/28/2018   Procedure: Laminectomy and Foraminotomy Lumbar Five-Sacral One, posterolateral fusion and fixation;  Surgeon: Joshua Alm RAMAN, MD;  Location[facility] OR;  Service: Neurosurgery;  Laterality: N/A;  posterior   LUMBAR EPIDURAL INJECTION     STERIOD INJECTION N/A 01/10/2017   Procedure: MINOR EXPAREL  INJECTION;  Surgeon: Kallie Manuelita BROCKS, MD;  Location: [facility];  Service: General;  Laterality: N/A;   TOTAL ABDOMINAL HYSTERECTOMY W/ BILATERAL SALPINGOOPHORECTOMY N/A    TUBAL LIGATION  2003   Past Medical History:  Diagnosis Date   Abnormal Pap smear of cervix    ANXIETY 08/30/2007   Asthma    Chronic back pain    Claustrophobia    pt requests Versed  before surgery.   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   BP 96/66   Pulse 78   Ht 5' 7 (1.702 m)   Wt 247 lb (112 kg)   LMP 02/25/2016   SpO2 95%   BMI 38.69 kg/m   Opioid Risk Score:   Fall Risk Score:  `1  Depression screen PHQ 2/9     01/22/2024    1:18 PM 12/05/2023    3:16 PM 10/02/2023    1:55 PM 08/07/2023    2:10 PM 04/03/2023    2:16 PM 12/05/2022   12:24 PM 10/02/2022    2:55 PM  Depression screen PHQ 2/9  Decreased Interest 0 0 0 0 0 0 0  Down, Depressed, Hopeless 0 0 0 0 0 0 0  PHQ - 2 Score 0 0  0 0 0 0 0  Altered sleeping  0       Tired, decreased energy  2       Change in appetite  2       Feeling bad or failure about yourself   0       Trouble concentrating  0       Moving slowly or fidgety/restless  0       Suicidal thoughts  0       PHQ-9 Score  4        Difficult doing work/chores  Somewhat difficult          Data saved with a previous flowsheet row definition    Review of Systems  HENT: Negative.    Eyes: Negative.   Respiratory: Negative.    Gastrointestinal: Negative.   Endocrine: Negative.   Genitourinary: Negative.   Musculoskeletal:  Positive for arthralgias and back pain.       Pain in both legs, left hip pain  Skin: Negative.   Allergic/Immunologic: Negative.   Neurological:        Tingling  Hematological: Negative.   All other systems reviewed and are negative.      Objective:   Physical Exam Gen: no distress, normal appearing, weight 247 lbs, BMI 38.69 HEENT: oral mucosa pink and moist, NCAT,  exopthalmos Gen: no distress, normal appearing HEENT: oral mucosa pink and moist, NCAT Cardio: Reg rate Gen: no distress, normal appearing HEENT: oral mucosa pink and moist, NCAT Cardio: Reg rate Chest: normal effort, normal rate of breathing Abd: soft, non-distended Ext: no edema Psych: pleasant, normal affect Musculoskeletal: Normal ambulation, right achiles tendinopathy, left heel spur, negative slump test, left sided lower extremity weakness, right shoulder and elbow in brace    Assessment & Plan:  Mrs. Ferrando is a 44 year old woman who presents to establish care for lumbar spinal stenosis/neuropathy  1) Lumbar spinal stenosis with lumbar neurogenic claudication.  -Discussed benefits of exercise in reducing pain. -UDS ordered -refilled percocet -discussed that pain is well controlled, that she deferred surgical eval as she is losing weight   -discussed that Journavx is a highly selective inhibitor for Nav 1.8, which is specific for pain in  the peripheral nervous system, discussed that lidocaine  in contrast affects all Nav receptors, discussed that patient can try using this medication as prn for pain, though its studies have focused on its use for acute pain. Discussed that it has been studied against opioids for acute pain with comparable efficacy. Discussed that I have not seen any patient side effects thus far. Discussed that we have samples available and we have copay cards available. Discussed that outpatient if the medication requires a prior auth the copay should be $30 for at least a 60 day supply. The medication may be more likely to be in stock in [facility] and [facility]. We do have samples available.   -discussed that gabapentin  negatively impacted her memory and did not help her pain, discussed that she was taking 900mg  QID for 1 year -discussed that her spinal surgeon does not feel that she needs surgery at this time -discussed that she had a steroid injection and this provided 14 days of relief -discussed that lidocaine  patch was not helpful -refilled Percocet -discussed disc height loss on recent XR -continue D3  -discussed that she is following with Dr. Joshua -failed lidocaine  patches -discussed avoiding topamax  given that this can cause hyperthyroidism -continue light restriction on bending and twisting -discussed that pain has acutely worsened -discussed that she got an XR and an MRI of her lumbar spine -Discussed current symptoms of pain and history of pain.  -Discussed benefits of exercise in reducing pain. -Discussed following foods that may reduce pain: 1) Ginger (especially studied for arthritis)- reduce leukotriene production to decrease inflammation 2) Blueberries- high in phytonutrients that decrease inflammation 3) Salmon- marine omega-3s reduce joint swelling and pain 4) Pumpkin seeds- reduce inflammation 5) dark chocolate- reduces inflammation 6) turmeric- reduces inflammation 7) tart cherries - reduce  pain and stiffness 8) extra virgin olive oil - its compound olecanthal helps to block prostaglandins  9) chili peppers- can be eaten or applied topically via capsaicin 10) mint- helpful for headache, muscle aches, joint pain, and itching 11) garlic- reduces inflammation  Link to further information on diet for chronic pain: [email or web address].[email or web address]   -discussed that her stress has not worsen her pain Continue heat therapy -discussed negative response to gabapentin  in the past.  Prescribing Home Zynex NexWave Stimulator Device and supplies as needed. IFC, NMES and TENS medically necessary Treatment Rx: Daily @ 30-40 minutes per treatment PRN. Zynex NexWave only, no substitutions. Treatment Goals: 1) To reduce and/or eliminate pain 2) To improve functional capacity and Activities of daily living 3) To reduce or prevent the need for oral medications 4) To improve circulation in the  injured region 5) To decrease or prevent muscle spasm and muscle atrophy 6) To provide a self-management tool to the patient The patient has not sufficiently improved with conservative care. Numerous studies indexed by Medline and PubMed.gov have shown Neuromuscular, Interferential, and TENS stimulators to reduce pain, improve function, and reduce medication use in injured patients. Continued use of this evidence based, safe, drug free treatment is both reasonable and medically necessary at this time.    Continue UDS monitoring -Discussed current symptoms -She is allergic to bupivicaine, she has tried surgery, injections, medication. Oxycodone  5mg  helped. She has never failed a drug screen. She works at [employer]. Increase oxycodone  to 5mg  daily.  -Will obtain UDS and pain contract today. If results negative, can prescribe Percocet 5mg -325 monthly PRN.  Lumbar MRI reviewed as follows:  IMPRESSION: 1. No impingement identified in the lumbar spine. 2.  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 3. Degenerative disc disease at T11-12, L2- 3, L3-4, and L5-S1. 4. Cholelithiasis.   2) Obesity: weight 260 lbs, BMI 40.75 -discussed risks and benefits from GLP-1s, discussed Rybelsius, discussed that she can get this prescribed by her PCP or [facility] MD, discussed risks of nausea, malnutrition, discussed her husband's experience with Zepbound, discussed that when she eats healthier she is able to lose more weight, discussed that she is eating more cold vegetables -Educated regarding health benefits of weight loss- for pain, general health, chronic disease prevention, immune health, mental health.  -continue black coffee -Educated regarding health benefits of weight loss- for pain, general health, chronic disease prevention, immune health, mental health.  -Will monitor weight every visit.  -Consider Roobois tea daily.  -Discussed the benefits of intermittent fasting. -Discussed foods that can assist in weight loss: 1) leafy greens- high in fiber and nutrients 2) dark chocolate- improves metabolism (if prefer sweetened, best to sweeten with honey instead of sugar).  3) cruciferous vegetables- high in fiber and protein 4) full fat yogurt: high in healthy fat, protein, calcium , and probiotics 5) apples- high in a variety of phytochemicals 6) nuts- high in fiber and protein that increase feelings of fullness 7) grapefruit: rich in nutrients, antioxidants, and fiber (not to be taken with anticoagulation) 8) beans- high in protein and fiber 9) salmon- has high quality protein and healthy fats 10) green tea- rich in polyphenols 11) eggs- rich in choline and vitamin D  12) tuna- high protein, boosts metabolism 13) avocado- decreases visceral abdominal fat 14) chicken (pasture raised): high in protein and iron 15) blueberries- reduce abdominal fat and cholesterol 16) whole grains- decreases calories retained during  digestion, speeds metabolism 17) chia seeds- curb appetite 18) chilies- increases fat metabolism  -Discussed supplements that can be used:  1) Metatrim 400mg  BID 30 minutes before breakfast and dinner  2) Sphaeranthus indicus and Garcinia mangostana (combinations of these and #1 can be found in capsicum and zychrome  3) green coffee bean extract 400mg  twice per day or Irvingia (african mango) 150 to 300mg  twice per day.   3) migraines -discussed that these resolved and she feels these were a flare up of shingles   4) HTN: -decrease Lisinopril  to 10mg  daily -Advised checking BP daily at home and logging results to bring into follow-up appointment with PCP and myself. -Reviewed BP meds today.  -Advised regarding healthy foods that can help lower blood pressure and provided with a list: 1) citrus foods- high in vitamins and minerals 2) salmon and other fatty fish - reduces inflammation  and oxylipins 3) swiss chard (leafy green)- high level of nitrates 4) pumpkin seeds- one of the best natural sources of magnesium  5) Beans and lentils- high in fiber, magnesium , and potassium 6) Berries- high in flavonoids 7) Amaranth (whole grain, can be cooked similarly to rice and oats)- high in magnesium  and fiber 8) Pistachios- even more effective at reducing BP than other nuts 9) Carrots- high in phenolic compounds that relax blood vessels and reduce inflammation 10) Celery- contain phthalides that relax tissues of arterial walls 11) Tomatoes- can also improve cholesterol and reduce risk of heart disease 12) Broccoli- good source of magnesium , calcium , and potassium 13) Greek yogurt: high in potassium and calcium  14) Herbs and spices: Celery seed, cilantro, saffron, lemongrass, black cumin, ginseng, cinnamon, cardamom, sweet basil, and ginger 15) Chia and flax seeds- also help to lower cholesterol and blood sugar 16) Beets- high levels of nitrates that relax blood vessels  17) spinach and bananas-  high in potassium  -Provided lise of supplements that can help with hypertension:  1) magnesium : one high quality brand is Bioptemizers since it contains all 7 types of magnesium , otherwise over the counter magnesium  gluconate 400mg  is a good option 2) B vitamins 3) vitamin D  4) potassium 5) CoQ10 6) L-arginine 7) Vitamin C 8) Beetroot -Educated that goal BP is 120/80. -Made goal to incorporate some of the above foods into diet.    5) Left heel plantar fascitis: Discussed resolution with ECSWT  6) Right Achiles tendinopathy: -discussed resolution with ECSWT -discussed continued improvement  7) Type 2 diabetes: -Discussed the benefits of intermittent fasting. Recommended starting with pushing dinner 15 minutes earlier and when this feels easy, continuing to push dinner 15 minutes earlier. Discussed that this can help her body to improve its ability to burn fat rather than glucose, improving insulin sensitivity. Recommended drinking Roobois tea in the evening to help curb appetite and for its numerous health benefits.   -discussed most recent HgbA1c of 6.2. -discussed that she is doing an incredible job with her intermittent fasting -commended eating smaller dinners.  -continue steamed vegetables -continue protein shake -discussed the importance of fiber.   8) Nausea: refilled zofran   9) Stress: Discussed stress from obtaining the custody of her grandkids due to her daughter having issues with drug abuse  10) Sleep apnea: continue CPAP  11) Hyperthyroidism 2/2 Grave's disease: -discussed that recent thyroid  labs have worsened and her symptoms have greatly worsened -referred to endocrinology, discussed that she thinks her endocrinologist said eating greens would help her hyperthyroidism -discussed that she switched to a different endocrinologist who has helped get her levels into the standard range  12) Bilateral knee pain: -discussed that she feel work worsens this  pain  13) Obesity: -continue Moujaro, discussed that she prayed upon whether she should start the injections -discussed that she is aiming for no more than 9 grams of fiber at a time -discussed that herbal teas can be good for you -recommended Paris Harvest MD -discussed pros and cons of GLP-1s -discussed compounded versus injectibles  -discussed that cruciferous vegetables are a great natural source -BMI reviewed and is 40.82 on 7/15 -weight reviewed and is 260 lbs  14) Kidney stone: -discussed that this has not recurred  15. Right shoulder pain 2/2 surgery: -percocet 5mg  q6H prn prescribed temporarily -continue eating a lots of fruits and vegetables  "

## 2024-04-01 ENCOUNTER — Encounter (HOSPITAL_COMMUNITY): Payer: Self-pay

## 2024-04-01 ENCOUNTER — Emergency Department (HOSPITAL_COMMUNITY)
Admission: EM | Admit: 2024-04-01 | Discharge: 2024-04-01 | Discharge status: Against Medical Advice | Attending: Emergency Medicine | Admitting: Emergency Medicine

## 2024-04-01 ENCOUNTER — Emergency Department (HOSPITAL_COMMUNITY)

## 2024-04-01 ENCOUNTER — Other Ambulatory Visit: Payer: Self-pay

## 2024-04-01 DIAGNOSIS — Z5321 Procedure and treatment not carried out due to patient leaving prior to being seen by health care provider: Secondary | ICD-10-CM | POA: Insufficient documentation

## 2024-04-01 DIAGNOSIS — R079 Chest pain, unspecified: Secondary | ICD-10-CM | POA: Insufficient documentation

## 2024-04-01 DIAGNOSIS — G43909 Migraine, unspecified, not intractable, without status migrainosus: Secondary | ICD-10-CM | POA: Diagnosis not present

## 2024-04-01 LAB — COMPREHENSIVE METABOLIC PANEL WITH GFR
ALT: 21 U/L (ref 0–44)
AST: 20 U/L (ref 15–41)
Albumin: 4.5 g/dL (ref 3.5–5.0)
Alkaline Phosphatase: 80 U/L (ref 38–126)
Anion gap: 14 (ref 5–15)
BUN: 17 mg/dL (ref 6–20)
CO2: 24 mmol/L (ref 22–32)
Calcium: 9.9 mg/dL (ref 8.9–10.3)
Chloride: 101 mmol/L (ref 98–111)
Creatinine, Ser: 0.76 mg/dL (ref 0.44–1.00)
GFR, Estimated: >60 mL/min
Glucose, Bld: 96 mg/dL (ref 70–99)
Potassium: 4.2 mmol/L (ref 3.5–5.1)
Sodium: 138 mmol/L (ref 135–145)
Total Bilirubin: 0.3 mg/dL (ref 0.0–1.2)
Total Protein: 7.5 g/dL (ref 6.5–8.1)

## 2024-04-01 LAB — CBC WITH DIFFERENTIAL/PLATELET
Abs Immature Granulocytes: 0.04 K/uL (ref 0.00–0.07)
Basophils Absolute: 0.1 K/uL (ref 0.0–0.1)
Basophils Relative: 1 %
Eosinophils Absolute: 0.2 K/uL (ref 0.0–0.5)
Eosinophils Relative: 2 %
HCT: 40.9 % (ref 36.0–46.0)
Hemoglobin: 13.9 g/dL (ref 12.0–15.0)
Immature Granulocytes: 1 %
Lymphocytes Relative: 32 %
Lymphs Abs: 2.7 K/uL (ref 0.7–4.0)
MCH: 30.5 pg (ref 26.0–34.0)
MCHC: 34 g/dL (ref 30.0–36.0)
MCV: 89.7 fL (ref 80.0–100.0)
Monocytes Absolute: 0.7 K/uL (ref 0.1–1.0)
Monocytes Relative: 8 %
Neutro Abs: 4.9 K/uL (ref 1.7–7.7)
Neutrophils Relative %: 56 %
Platelets: 272 K/uL (ref 150–400)
RBC: 4.56 MIL/uL (ref 3.87–5.11)
RDW: 13 % (ref 11.5–15.5)
WBC: 8.6 K/uL (ref 4.0–10.5)
nRBC: 0 % (ref 0.0–0.2)

## 2024-04-01 LAB — TROPONIN T, HIGH SENSITIVITY: Troponin T High Sensitivity: <15 ng/L (ref 0–19)

## 2024-04-01 NOTE — ED Triage Notes (Signed)
 Pt arrived via POV c/o sharp, aching chest pain that radiates to her right shoulder blade. Pt also reports visual changes and severe migraine.

## 2024-04-18 ENCOUNTER — Other Ambulatory Visit: Payer: Self-pay | Admitting: Family Medicine

## 2024-05-26 ENCOUNTER — Encounter: Admitting: Physical Medicine and Rehabilitation

## 2024-07-28 ENCOUNTER — Encounter: Admitting: Physical Medicine and Rehabilitation
